# Patient Record
Sex: Female | Born: 1955
Health system: Southern US, Community
[De-identification: ages and names within clinical notes are randomized; demographics above are authoritative.]

## PROBLEM LIST (undated history)

## (undated) DIAGNOSIS — I779 Disorder of arteries and arterioles, unspecified: Secondary | ICD-10-CM

## (undated) DIAGNOSIS — F32A Depression, unspecified: Secondary | ICD-10-CM

## (undated) DIAGNOSIS — I809 Phlebitis and thrombophlebitis of unspecified site: Secondary | ICD-10-CM

## (undated) DIAGNOSIS — I2089 Other forms of angina pectoris: Secondary | ICD-10-CM

## (undated) DIAGNOSIS — I471 Supraventricular tachycardia, unspecified: Secondary | ICD-10-CM

## (undated) DIAGNOSIS — I8393 Asymptomatic varicose veins of bilateral lower extremities: Secondary | ICD-10-CM

## (undated) DIAGNOSIS — K219 Gastro-esophageal reflux disease without esophagitis: Secondary | ICD-10-CM

## (undated) DIAGNOSIS — G4733 Obstructive sleep apnea (adult) (pediatric): Secondary | ICD-10-CM

## (undated) DIAGNOSIS — I10 Essential (primary) hypertension: Secondary | ICD-10-CM

## (undated) DIAGNOSIS — M1990 Unspecified osteoarthritis, unspecified site: Secondary | ICD-10-CM

## (undated) DIAGNOSIS — I700 Atherosclerosis of aorta: Secondary | ICD-10-CM

## (undated) DIAGNOSIS — Z955 Presence of coronary angioplasty implant and graft: Secondary | ICD-10-CM

## (undated) DIAGNOSIS — E785 Hyperlipidemia, unspecified: Secondary | ICD-10-CM

## (undated) DIAGNOSIS — B019 Varicella without complication: Secondary | ICD-10-CM

## (undated) DIAGNOSIS — Z7902 Long term (current) use of antithrombotics/antiplatelets: Secondary | ICD-10-CM

## (undated) DIAGNOSIS — Z7982 Long term (current) use of aspirin: Secondary | ICD-10-CM

## (undated) DIAGNOSIS — I2510 Atherosclerotic heart disease of native coronary artery without angina pectoris: Secondary | ICD-10-CM

## (undated) DIAGNOSIS — R0600 Dyspnea, unspecified: Secondary | ICD-10-CM

## (undated) DIAGNOSIS — R011 Cardiac murmur, unspecified: Secondary | ICD-10-CM

## (undated) DIAGNOSIS — F419 Anxiety disorder, unspecified: Secondary | ICD-10-CM

## (undated) DIAGNOSIS — M810 Age-related osteoporosis without current pathological fracture: Secondary | ICD-10-CM

## (undated) DIAGNOSIS — I519 Heart disease, unspecified: Secondary | ICD-10-CM

## (undated) DIAGNOSIS — Z87442 Personal history of urinary calculi: Secondary | ICD-10-CM

## (undated) DIAGNOSIS — T7840XA Allergy, unspecified, initial encounter: Secondary | ICD-10-CM

## (undated) DIAGNOSIS — K529 Noninfective gastroenteritis and colitis, unspecified: Secondary | ICD-10-CM

## (undated) DIAGNOSIS — D649 Anemia, unspecified: Secondary | ICD-10-CM

## (undated) DIAGNOSIS — I82409 Acute embolism and thrombosis of unspecified deep veins of unspecified lower extremity: Secondary | ICD-10-CM

## (undated) DIAGNOSIS — I499 Cardiac arrhythmia, unspecified: Secondary | ICD-10-CM

## (undated) DIAGNOSIS — E7841 Elevated Lipoprotein(a): Secondary | ICD-10-CM

## (undated) DIAGNOSIS — K5090 Crohn's disease, unspecified, without complications: Secondary | ICD-10-CM

## (undated) DIAGNOSIS — Z9841 Cataract extraction status, right eye: Secondary | ICD-10-CM

## (undated) DIAGNOSIS — I219 Acute myocardial infarction, unspecified: Secondary | ICD-10-CM

## (undated) DIAGNOSIS — Z9889 Other specified postprocedural states: Secondary | ICD-10-CM

## (undated) DIAGNOSIS — N200 Calculus of kidney: Secondary | ICD-10-CM

## (undated) DIAGNOSIS — R0609 Other forms of dyspnea: Secondary | ICD-10-CM

## (undated) DIAGNOSIS — T4145XA Adverse effect of unspecified anesthetic, initial encounter: Secondary | ICD-10-CM

## (undated) DIAGNOSIS — D509 Iron deficiency anemia, unspecified: Secondary | ICD-10-CM

## (undated) DIAGNOSIS — G4700 Insomnia, unspecified: Secondary | ICD-10-CM

## (undated) DIAGNOSIS — B029 Zoster without complications: Secondary | ICD-10-CM

## (undated) DIAGNOSIS — I214 Non-ST elevation (NSTEMI) myocardial infarction: Secondary | ICD-10-CM

## (undated) DIAGNOSIS — R112 Nausea with vomiting, unspecified: Secondary | ICD-10-CM

## (undated) DIAGNOSIS — G43909 Migraine, unspecified, not intractable, without status migrainosus: Secondary | ICD-10-CM

## (undated) DIAGNOSIS — T8859XA Other complications of anesthesia, initial encounter: Secondary | ICD-10-CM

## (undated) DIAGNOSIS — M2392 Unspecified internal derangement of left knee: Secondary | ICD-10-CM

## (undated) HISTORY — PX: EYE SURGERY: SHX253

## (undated) HISTORY — DX: Phlebitis and thrombophlebitis of unspecified site: I80.9

## (undated) HISTORY — DX: Allergy, unspecified, initial encounter: T78.40XA

## (undated) HISTORY — DX: Cardiac murmur, unspecified: R01.1

## (undated) HISTORY — DX: Crohn's disease, unspecified, without complications: K50.90

## (undated) HISTORY — PX: OTHER SURGICAL HISTORY: SHX169

## (undated) HISTORY — PX: FRACTURE SURGERY: SHX138

## (undated) HISTORY — DX: Calculus of kidney: N20.0

## (undated) HISTORY — DX: Essential (primary) hypertension: I10

## (undated) HISTORY — DX: Acute myocardial infarction, unspecified: I21.9

## (undated) HISTORY — DX: Varicella without complication: B01.9

## (undated) HISTORY — DX: Hyperlipidemia, unspecified: E78.5

## (undated) HISTORY — DX: Anxiety disorder, unspecified: F41.9

## (undated) HISTORY — PX: CATARACT EXTRACTION: SUR2

## (undated) HISTORY — PX: BREAST CYST ASPIRATION: SHX578

## (undated) HISTORY — DX: Migraine, unspecified, not intractable, without status migrainosus: G43.909

## (undated) HISTORY — DX: Heart disease, unspecified: I51.9

## (undated) HISTORY — DX: Noninfective gastroenteritis and colitis, unspecified: K52.9

---
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
## 1988-11-09 HISTORY — PX: OTHER SURGICAL HISTORY: SHX169

## 1991-11-10 HISTORY — PX: TUBAL LIGATION: SHX77

## 2005-01-20 ENCOUNTER — Emergency Department: Payer: Self-pay | Admitting: Emergency Medicine

## 2005-01-28 ENCOUNTER — Ambulatory Visit: Payer: Self-pay | Admitting: Occupational Therapy

## 2005-02-10 ENCOUNTER — Ambulatory Visit: Payer: Self-pay | Admitting: Occupational Therapy

## 2005-02-23 ENCOUNTER — Ambulatory Visit: Payer: Self-pay | Admitting: Occupational Therapy

## 2005-02-24 ENCOUNTER — Ambulatory Visit: Payer: Self-pay | Admitting: Neurology

## 2005-10-07 ENCOUNTER — Ambulatory Visit: Payer: Self-pay | Admitting: General Surgery

## 2006-03-15 ENCOUNTER — Ambulatory Visit: Payer: Self-pay | Admitting: General Surgery

## 2007-03-17 ENCOUNTER — Ambulatory Visit: Payer: Self-pay | Admitting: General Surgery

## 2007-09-02 ENCOUNTER — Ambulatory Visit: Payer: Self-pay | Admitting: Internal Medicine

## 2007-12-29 ENCOUNTER — Ambulatory Visit: Payer: Self-pay | Admitting: Internal Medicine

## 2008-07-10 ENCOUNTER — Emergency Department: Payer: Self-pay | Admitting: Emergency Medicine

## 2010-05-07 ENCOUNTER — Ambulatory Visit: Payer: Self-pay | Admitting: Ophthalmology

## 2010-05-26 LAB — HM PAP SMEAR: HM PAP: NEGATIVE

## 2010-11-19 ENCOUNTER — Ambulatory Visit: Payer: Self-pay | Admitting: Internal Medicine

## 2011-02-18 LAB — HM DEXA SCAN

## 2011-05-29 DIAGNOSIS — M2550 Pain in unspecified joint: Secondary | ICD-10-CM | POA: Insufficient documentation

## 2011-11-30 ENCOUNTER — Ambulatory Visit: Payer: Self-pay

## 2011-12-21 ENCOUNTER — Ambulatory Visit: Payer: Self-pay

## 2012-04-06 ENCOUNTER — Ambulatory Visit: Payer: Self-pay | Admitting: Ophthalmology

## 2012-05-17 ENCOUNTER — Ambulatory Visit: Payer: Self-pay | Admitting: Internal Medicine

## 2012-10-12 DIAGNOSIS — K5090 Crohn's disease, unspecified, without complications: Secondary | ICD-10-CM | POA: Insufficient documentation

## 2013-04-28 LAB — HM COLONOSCOPY

## 2013-05-18 LAB — TSH: TSH: 1.87 u[IU]/mL (ref 0.41–5.90)

## 2013-06-01 ENCOUNTER — Ambulatory Visit: Payer: Self-pay | Admitting: Internal Medicine

## 2013-12-04 ENCOUNTER — Ambulatory Visit: Payer: Self-pay | Admitting: Physician Assistant

## 2013-12-04 LAB — RAPID STREP-A WITH REFLX: MICRO TEXT REPORT: NEGATIVE

## 2013-12-04 LAB — RAPID INFLUENZA A&B ANTIGENS

## 2013-12-04 LAB — MONONUCLEOSIS SCREEN: Mono Test: NEGATIVE

## 2013-12-07 LAB — BETA STREP CULTURE(ARMC)

## 2013-12-17 ENCOUNTER — Emergency Department: Payer: Self-pay | Admitting: Emergency Medicine

## 2013-12-17 LAB — CBC
HCT: 38.8 % (ref 35.0–47.0)
HGB: 13 g/dL (ref 12.0–16.0)
MCH: 31.2 pg (ref 26.0–34.0)
MCHC: 33.6 g/dL (ref 32.0–36.0)
MCV: 93 fL (ref 80–100)
Platelet: 324 10*3/uL (ref 150–440)
RBC: 4.18 10*6/uL (ref 3.80–5.20)
RDW: 13.7 % (ref 11.5–14.5)
WBC: 16.4 10*3/uL — AB (ref 3.6–11.0)

## 2013-12-17 LAB — COMPREHENSIVE METABOLIC PANEL
Albumin: 3.6 g/dL (ref 3.4–5.0)
Alkaline Phosphatase: 92 U/L
Anion Gap: 7 (ref 7–16)
BUN: 21 mg/dL — ABNORMAL HIGH (ref 7–18)
Bilirubin,Total: 0.3 mg/dL (ref 0.2–1.0)
CALCIUM: 8.6 mg/dL (ref 8.5–10.1)
CHLORIDE: 108 mmol/L — AB (ref 98–107)
CREATININE: 1.05 mg/dL (ref 0.60–1.30)
Co2: 24 mmol/L (ref 21–32)
EGFR (African American): >60
EGFR (Non-African Amer.): 58 — ABNORMAL LOW
GLUCOSE: 122 mg/dL — AB (ref 65–99)
Osmolality: 282 (ref 275–301)
Potassium: 3.1 mmol/L — ABNORMAL LOW (ref 3.5–5.1)
SGOT(AST): 29 U/L (ref 15–37)
SGPT (ALT): 37 U/L (ref 12–78)
SODIUM: 139 mmol/L (ref 136–145)
Total Protein: 7.1 g/dL (ref 6.4–8.2)

## 2013-12-17 LAB — URINALYSIS, COMPLETE
BLOOD: NEGATIVE
Bacteria: NEGATIVE
Bilirubin,UR: NEGATIVE
GLUCOSE, UR: NEGATIVE mg/dL (ref 0–75)
KETONE: NEGATIVE
Nitrite: NEGATIVE
PROTEIN: NEGATIVE
Ph: 7 (ref 4.5–8.0)
RBC,UR: NONE SEEN /HPF (ref 0–5)
SPECIFIC GRAVITY: 1.048 (ref 1.003–1.030)

## 2013-12-17 LAB — TROPONIN I: Troponin-I: <0.02 ng/mL

## 2014-03-12 DIAGNOSIS — D1803 Hemangioma of intra-abdominal structures: Secondary | ICD-10-CM | POA: Insufficient documentation

## 2014-04-25 DIAGNOSIS — K869 Disease of pancreas, unspecified: Secondary | ICD-10-CM | POA: Insufficient documentation

## 2014-08-07 ENCOUNTER — Ambulatory Visit: Payer: Self-pay | Admitting: Internal Medicine

## 2014-08-28 ENCOUNTER — Ambulatory Visit: Payer: Self-pay | Admitting: Physician Assistant

## 2014-09-13 ENCOUNTER — Emergency Department: Payer: Self-pay | Admitting: Emergency Medicine

## 2014-09-21 ENCOUNTER — Emergency Department: Payer: Self-pay | Admitting: Emergency Medicine

## 2014-09-21 LAB — CBC
HCT: 37.6 % (ref 35.0–47.0)
HGB: 12.4 g/dL (ref 12.0–16.0)
MCH: 30.5 pg (ref 26.0–34.0)
MCHC: 32.9 g/dL (ref 32.0–36.0)
MCV: 93 fL (ref 80–100)
PLATELETS: 363 10*3/uL (ref 150–440)
RBC: 4.06 10*6/uL (ref 3.80–5.20)
RDW: 14 % (ref 11.5–14.5)
WBC: 20.4 10*3/uL — ABNORMAL HIGH (ref 3.6–11.0)

## 2014-09-21 LAB — COMPREHENSIVE METABOLIC PANEL
ALK PHOS: 92 U/L
ALT: 42 U/L
ANION GAP: 12 (ref 7–16)
Albumin: 3.6 g/dL (ref 3.4–5.0)
BUN: 14 mg/dL (ref 7–18)
Bilirubin,Total: 0.3 mg/dL (ref 0.2–1.0)
Calcium, Total: 8.7 mg/dL (ref 8.5–10.1)
Chloride: 105 mmol/L (ref 98–107)
Co2: 24 mmol/L (ref 21–32)
Creatinine: 1.08 mg/dL (ref 0.60–1.30)
EGFR (African American): >60
GFR CALC NON AF AMER: 55 — AB
Glucose: 158 mg/dL — ABNORMAL HIGH (ref 65–99)
Osmolality: 285 (ref 275–301)
Potassium: 2.9 mmol/L — ABNORMAL LOW (ref 3.5–5.1)
SGOT(AST): 30 U/L (ref 15–37)
Sodium: 141 mmol/L (ref 136–145)
TOTAL PROTEIN: 7.5 g/dL (ref 6.4–8.2)

## 2014-09-21 LAB — TROPONIN I: TROPONIN-I: 0.02 ng/mL

## 2014-09-22 LAB — TROPONIN I
Troponin-I: 0.41 ng/mL — ABNORMAL HIGH
Troponin-I: 0.61 ng/mL — ABNORMAL HIGH

## 2014-09-22 LAB — CK-MB
CK-MB: 2.6 ng/mL (ref 0.5–3.6)
CK-MB: 3.9 ng/mL — ABNORMAL HIGH (ref 0.5–3.6)
CK-MB: 4.6 ng/mL — ABNORMAL HIGH (ref 0.5–3.6)
CK-MB: 4.7 ng/mL — ABNORMAL HIGH (ref 0.5–3.6)

## 2014-09-22 LAB — APTT: Activated PTT: 28.4 secs (ref 23.6–35.9)

## 2014-09-22 LAB — PROTIME-INR
INR: 1.1
Prothrombin Time: 13.8 secs (ref 11.5–14.7)

## 2014-09-22 LAB — HEPARIN LEVEL (UNFRACTIONATED): Anti-Xa(Unfractionated): 0.61 IU/mL (ref 0.30–0.70)

## 2014-09-23 ENCOUNTER — Ambulatory Visit: Payer: Self-pay | Admitting: Orthopedic Surgery

## 2014-09-23 ENCOUNTER — Inpatient Hospital Stay: Payer: Self-pay | Admitting: Internal Medicine

## 2014-09-23 LAB — HEPARIN LEVEL (UNFRACTIONATED): Anti-Xa(Unfractionated): 0.34 IU/mL (ref 0.30–0.70)

## 2014-09-23 LAB — CBC WITH DIFFERENTIAL/PLATELET
Basophil #: 0 10*3/uL (ref 0.0–0.1)
Basophil %: 0.4 %
Eosinophil #: 0.1 10*3/uL (ref 0.0–0.7)
Eosinophil %: 1.1 %
HCT: 34.4 % — ABNORMAL LOW (ref 35.0–47.0)
HGB: 11.4 g/dL — ABNORMAL LOW (ref 12.0–16.0)
Lymphocyte #: 2.7 10*3/uL (ref 1.0–3.6)
Lymphocyte %: 22.2 %
MCH: 31 pg (ref 26.0–34.0)
MCHC: 33.2 g/dL (ref 32.0–36.0)
MCV: 94 fL (ref 80–100)
Monocyte #: 0.8 x10 3/mm (ref 0.2–0.9)
Monocyte %: 6.4 %
Neutrophil #: 8.5 10*3/uL — ABNORMAL HIGH (ref 1.4–6.5)
Neutrophil %: 69.9 %
Platelet: 266 10*3/uL (ref 150–440)
RBC: 3.68 10*6/uL — ABNORMAL LOW (ref 3.80–5.20)
RDW: 14.2 % (ref 11.5–14.5)
WBC: 12.2 10*3/uL — ABNORMAL HIGH (ref 3.6–11.0)

## 2014-09-23 LAB — BASIC METABOLIC PANEL
Anion Gap: 6 — ABNORMAL LOW (ref 7–16)
BUN: 14 mg/dL (ref 7–18)
Calcium, Total: 7.8 mg/dL — ABNORMAL LOW (ref 8.5–10.1)
Chloride: 112 mmol/L — ABNORMAL HIGH (ref 98–107)
Co2: 25 mmol/L (ref 21–32)
Creatinine: 0.84 mg/dL (ref 0.60–1.30)
EGFR (African American): >60
EGFR (Non-African Amer.): >60
Glucose: 87 mg/dL (ref 65–99)
Osmolality: 285 (ref 275–301)
Potassium: 4.1 mmol/L (ref 3.5–5.1)
Sodium: 143 mmol/L (ref 136–145)

## 2014-09-24 HISTORY — PX: LEFT HEART CATH AND CORONARY ANGIOGRAPHY: CATH118249

## 2014-09-24 LAB — CBC WITH DIFFERENTIAL/PLATELET
Basophil #: 0 10*3/uL (ref 0.0–0.1)
Basophil %: 0.4 %
Eosinophil #: 0.3 10*3/uL (ref 0.0–0.7)
Eosinophil %: 2.8 %
HCT: 37.5 % (ref 35.0–47.0)
HGB: 12.6 g/dL (ref 12.0–16.0)
Lymphocyte #: 2.4 10*3/uL (ref 1.0–3.6)
Lymphocyte %: 22.4 %
MCH: 31 pg (ref 26.0–34.0)
MCHC: 33.5 g/dL (ref 32.0–36.0)
MCV: 93 fL (ref 80–100)
Monocyte #: 1 x10 3/mm — ABNORMAL HIGH (ref 0.2–0.9)
Monocyte %: 9.1 %
Neutrophil #: 7 10*3/uL — ABNORMAL HIGH (ref 1.4–6.5)
Neutrophil %: 65.3 %
Platelet: 301 10*3/uL (ref 150–440)
RBC: 4.06 10*6/uL (ref 3.80–5.20)
RDW: 13.7 % (ref 11.5–14.5)
WBC: 10.8 10*3/uL (ref 3.6–11.0)

## 2014-09-24 LAB — LIPID PANEL
CHOLESTEROL: 191 mg/dL (ref 0–200)
HDL Cholesterol: 50 mg/dL (ref 40–60)
Ldl Cholesterol, Calc: 107 mg/dL — ABNORMAL HIGH (ref 0–100)
TRIGLYCERIDES: 172 mg/dL (ref 0–200)
VLDL Cholesterol, Calc: 34 mg/dL (ref 5–40)

## 2014-09-24 LAB — HEPARIN LEVEL (UNFRACTIONATED): Anti-Xa(Unfractionated): 0.34 IU/mL (ref 0.30–0.70)

## 2014-10-30 DIAGNOSIS — S61011A Laceration without foreign body of right thumb without damage to nail, initial encounter: Secondary | ICD-10-CM | POA: Insufficient documentation

## 2014-12-21 ENCOUNTER — Ambulatory Visit: Payer: Self-pay | Admitting: Physician Assistant

## 2014-12-21 ENCOUNTER — Ambulatory Visit: Payer: Self-pay

## 2015-02-01 LAB — BASIC METABOLIC PANEL
BUN: 15 mg/dL (ref 4–21)
Creatinine: 0.8 mg/dL (ref 0.5–1.1)
GLUCOSE: 87 mg/dL
POTASSIUM: 4.1 mmol/L (ref 3.4–5.3)
SODIUM: 140 mmol/L (ref 137–147)

## 2015-02-01 LAB — HEPATIC FUNCTION PANEL
ALT: 18 U/L (ref 7–35)
AST: 18 U/L (ref 13–35)
Alkaline Phosphatase: 79 U/L (ref 25–125)
Bilirubin, Total: 0.5 mg/dL

## 2015-02-01 LAB — LIPID PANEL
Cholesterol: 249 mg/dL — AB (ref 0–200)
HDL: 49 mg/dL (ref 35–70)
LDL CALC: 168 mg/dL
TRIGLYCERIDES: 198 mg/dL — AB (ref 40–160)

## 2015-02-18 ENCOUNTER — Emergency Department
Admit: 2015-02-18 | Disposition: A | Discharge status: Home / Self Care | Payer: Self-pay | Admitting: Emergency Medicine

## 2015-02-19 LAB — CBC
HCT: 37.7 % (ref 35.0–47.0)
HGB: 12.2 g/dL (ref 12.0–16.0)
MCH: 29.9 pg (ref 26.0–34.0)
MCHC: 32.4 g/dL (ref 32.0–36.0)
MCV: 92 fL (ref 80–100)
Platelet: 288 10*3/uL (ref 150–440)
RBC: 4.1 10*6/uL (ref 3.80–5.20)
RDW: 14.1 % (ref 11.5–14.5)
WBC: 12.9 10*3/uL — ABNORMAL HIGH (ref 3.6–11.0)

## 2015-02-19 LAB — BASIC METABOLIC PANEL
Anion Gap: 7 (ref 7–16)
BUN: 25 mg/dL — AB
CREATININE: 0.83 mg/dL
Calcium, Total: 9.1 mg/dL
Chloride: 106 mmol/L
Co2: 25 mmol/L
GLUCOSE: 118 mg/dL — AB
Potassium: 3.8 mmol/L
Sodium: 138 mmol/L

## 2015-02-19 LAB — TROPONIN I: Troponin-I: <0.03 ng/mL

## 2015-02-25 ENCOUNTER — Ambulatory Visit
Admit: 2015-02-25 | Disposition: A | Discharge status: Home / Self Care | Payer: Self-pay | Attending: Family Medicine | Admitting: Family Medicine

## 2015-02-25 LAB — COMPREHENSIVE METABOLIC PANEL
ALT: 20 U/L
AST: 25 U/L
Albumin: 3.9 g/dL
Alkaline Phosphatase: 64 U/L
Anion Gap: 9 (ref 7–16)
BUN: 12 mg/dL
Bilirubin,Total: 0.5 mg/dL
Calcium, Total: 8.9 mg/dL
Chloride: 97 mmol/L — ABNORMAL LOW
Co2: 29 mmol/L
Creatinine: 0.83 mg/dL
EGFR (African American): >60
Glucose: 100 mg/dL — ABNORMAL HIGH
Potassium: 3.8 mmol/L
Sodium: 135 mmol/L
TOTAL PROTEIN: 7.2 g/dL

## 2015-02-25 LAB — URINALYSIS, COMPLETE
Bilirubin,UR: NEGATIVE
Blood: NEGATIVE
Glucose,UR: NEGATIVE
Ketone: NEGATIVE
Nitrite: NEGATIVE
PH: 5.5 (ref 5.0–8.0)
RBC,UR: NONE SEEN /HPF (ref 0–5)
SPECIFIC GRAVITY: 1.02 (ref 1.000–1.030)

## 2015-02-25 LAB — CBC WITH DIFFERENTIAL/PLATELET
Basophil #: 0 10*3/uL (ref 0.0–0.1)
Basophil %: 0.5 %
EOS ABS: 0.1 10*3/uL (ref 0.0–0.7)
Eosinophil %: 0.6 %
HCT: 40.4 % (ref 35.0–47.0)
HGB: 13.8 g/dL (ref 12.0–16.0)
LYMPHS ABS: 1.3 10*3/uL (ref 1.0–3.6)
Lymphocyte %: 13.3 %
MCH: 30.7 pg (ref 26.0–34.0)
MCHC: 34.2 g/dL (ref 32.0–36.0)
MCV: 90 fL (ref 80–100)
MONO ABS: 1.5 x10 3/mm — AB (ref 0.2–0.9)
Monocyte %: 14.8 %
Neutrophil #: 6.9 10*3/uL — ABNORMAL HIGH (ref 1.4–6.5)
Neutrophil %: 70.8 %
Platelet: 192 10*3/uL (ref 150–440)
RBC: 4.51 10*6/uL (ref 3.80–5.20)
RDW: 14 % (ref 11.5–14.5)
WBC: 9.8 10*3/uL (ref 3.6–11.0)

## 2015-02-25 LAB — RAPID INFLUENZA A&B ANTIGENS

## 2015-03-02 NOTE — Consult Note (Signed)
PATIENT NAME:  Morgan, Moore MR#:  [id number] DATE OF BIRTH:  10-02-1956  DATE OF CONSULTATION:  09/23/2014  CONSULTING PHYSICIAN:  Claud Kelp, MD  CHIEF COMPLAINT: Right thumb near traumatic degloving injury.  HISTORY OF PRESENT ILLNESS: Morgan Moore is a 59 year old female who is previously known to [employer] as she sustained a near distal right thumb amputation from a traumatic laceration 10 days ago. The wound was  irrigated and closed and she is set to follow up with a hand surgeon in [place].  Her recovery was complicated by an anaphylactic reaction to her Keflex for which she was started on and secondarily when she was switched to Bactrim she had a similar anaphylactic reaction. She has been undergoing daily dressing changes to her thumb and is admitted presently for chest pain and is scheduled to undergo a cardiac catheterization tomorrow. Nursing was doing her daily dressing change and consulted with orthopedics just to take a look at her progress. The patient was complaining of some new burning sensation in the pad of her thumb.   REVIEW OF SYSTEMS:  CONSTITUTIONAL: She denies any fevers or chills. HEENT: She denies any blurred vision or double vision. She is denying any hearing loss or tinnitus.   RESPIRATORY:  She is denying any chest pain or shortness of breath.  GASTROINTESTINAL: She is denying any nausea or vomiting.   PAST MEDICAL HISTORY: Significant for Crohn disease, osteopenia, as well as a history of a DVT.   PAST SURGICAL HISTORY:  She did have open reduction internal fixation of her forearm as a child.  She has had bilateral tubal ligation as well as tonsillectomy with adenoidectomy.    HOME MEDICATIONS: Include aspirin, oxycodone, verapamil, and Zofran.  ALLERGIES: CURRENTLY INCLUDE KEFLEX, CODEINE, MECLIZINE, AND BACTRIM.   PHYSICAL EXAMINATION:  GENERAL:  She is a very pleasant 59 year old female in no acute distress.  HAND:  Her right thumb  demonstrates a complex laceration extending across the entire volar surface just distal to the IP joint.  The suture line is dry and intact with no drainage. There is a very dusky appearance to the distal aspect of the skin flap which she states has been stable for the past several days. There is no swelling or tenderness proximal to the IP joint. She is able to range her [facility] joint without pain.   ASSESSMENT: A 60 year old female with complex thumb laceration. She has an appointment with a hand surgeon in [place] next week. Again, she is here for her cardiac catheterization. I recommend she continue her daily dressing changes and ice and elevate as appropriate.  Her burning sensation is likely secondary to digital nerve damage at the time of her traumatic injury.  There are no signs of infection presently. We will continue to monitor while she is an inpatient and then upon discharge she will follow up with her scheduled hand surgeon.    ____________________________ Claud Kelp, MD tte:LT D: 09/23/2014 16:41:13 ET T: 09/23/2014 17:38:44 ET JOB#: [id number]  cc: Claud Kelp, MD, <Dictator> Claud Kelp MD ELECTRONICALLY SIGNED 09/23/2014 18:59

## 2015-03-02 NOTE — Consult Note (Signed)
PATIENT NAME:  Morgan Moore, WEINGARTEN MR#:  [id number] DATE OF BIRTH:  1956-01-09  DATE OF CONSULTATION:  09/22/2014  REFERRING PHYSICIAN:  Rosilyn Mings, MD.  CONSULTING PHYSICIAN:  Dwayne D. Mohnton, MD  PRIMARY CARE PHYSICIAN:   INDICATION: Chest pain, elevated troponin.   HISTORY OF PRESENT ILLNESS:  The patient is a 59 year old white female who came to the Emergency Room on multiple occasions with possible allergic reaction. She had been given intravenous steroids previous to admission, complained of her throat swelling, tongue swelling, mild shortness of breath, dryness in the throat, difficulty swallowing. The felt like she had palpitations, tachycardia, which she has had in the past apparently.  She complained of some chest tightness.  The night of admission she presented with a problem with her throat again, not as bad as the day before, with some chest pain. EKG suggested ST segment depression laterally consistent with ischemia.  She had elevated troponin. She was admitted for further evaluation and care.  Her breathing and her throat have improved. She has had palpitations, tachycardia as well as some chest discomfort.   REVIEW OF SYSTEMS:  No blackout spells or syncope. No nausea or vomiting. Denies fever, chills, sweats. No weight loss, no weight gain, hemoptysis, emesis, denies bright red blood per rectum, denies vision change, hearing change. No sputum production, no cough. She has had shortness of breath, chest tightness and palpitations, tachycardia, throat swelling, tongue swelling, possible allergic reaction.  PAST MEDICAL HISTORY: Crohn disease, osteopenia, migraine headaches, history of DVT, palpitations, tachycardia, recent thumb injury, borderline obesity. DJD, mild depression.   PAST SURGICAL HISTORY:  Right arm surgery as a child, BTL, tonsillectomy, right thumb repair from trauma.   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 No smoking or alcohol consumption.   FAMILY  HISTORY: Cardiac disease, CVA. Her daughter died in the motor vehicle accident at age 30.   MEDICATIONS: Aspirin 81 mg a day, Bactrim double strength twice a day. EpiPen as needed, oxycodone p.r.n. for pain, verapamil 120 mg once a day, Zofran p.r.n.   ALLERGIES: SHE THINKS THEY ARE: KEFLEX, CODEINE, MECLIZINE, POSSIBLY BACTRIM.   LABORATORIES: Glucose 158, BUN 14, creatinine 1.08, sodium 141, potassium 2.9, chloride 105, bicarbonate 24. Calcium is 8.7. LFTs unremarkable. Initial troponin was negative.  Followup troponin was elevated. White count 20, hemoglobin 12, hematocrit 37.6.   Chest x-ray negative.  PHYSICAL EXAMINATION:  VITAL SIGNS: Blood pressure initially 150/90, pulse 90, respiratory rate 16, afebrile.  HEENT: Normocephalic, atraumatic. Pupils equal and reactive to light.  NECK: Supple. No significant JVD, bruits or adenopathy.  LUNGS: clear no significant wheeze, rhonchi, or rale.  HEART: Regular rhythm. Positive heart sounds. No rebound, guarding or tenderness.  EXTREMITIES: Within normal limits, except for bandage on her right thumb.   intact. SKIN:  Normal.   EKG: Initially, diffuse 1-2 mm ST depression laterally, , significant elevation, sinus rhythm, rate of about 90. Follow-up EKG shows resolution of ST segment changes.   ASSESSMENT: Chest pain.  Abnormal EKG, possible   angina, elevated troponin, palpitations, tachycardia, possible allergic reaction, thumb injury. Hypokalemia, Crohn disease, elevated white count, possibly related to steroid use.   PLAN:  1.  Agree with admit. Place on telemetry. Rule out for myocardial infarction. Follow up EKGs and troponins. I am concerned the elevated troponin may be related to demand ischemia versus non-Q subendocardial MI. Recommend short-term anticoagulation, aspirin, consider beta blockers therapy. Consider functional study versus cardiac catheterization for further evaluation. 2.  Hypokalemia. Will correct electrolyte, this  can  contribute to abnormal EKG findings. It is not low enough to account for her diffuse lateral ST segment changes and this may be related to some form of ischemia.  3.  Palpitations, tachycardia.  Add beta-blocker instead of verapamil or increase the verapamil dose to see if that does a better job of controlling the palpitations, tachycardia.  3.  Again, the leukocytosis with a white count of 20 may be related to steroid use from her allergic reaction presentation to the Emergency Room.  3.  Continue right thumb therapy. Will require antibiotic therapy and follow up with the hand surgeon to help with healing.  4.  Crohn disease. The patient is on mesalamine. We will probably continue the dose of that. She appears to be reasonably stable at this point. I recommend DVT prophylaxis right now with heparin and consider Lovenox at a later date when she is off full-dose heparin.   5.  GI prophylaxis. If necessary, she has been c/o symptoms. Continue to follow for possible allergic reaction.   We will treat the patient medically and conservatively for now. Consider further cardiac work-up with her significant family history, abnormal EKG changes and elevated troponin, as well as other risk factors.    ____________________________ Loran Senters Clayborn Bigness, MD ddc:lt D: 09/22/2014 13:03:30 ET T: 09/22/2014 14:30:06 ET JOB#: [id number]  cc: Dwayne D. Clayborn Bigness, MD, <Dictator> Yolonda Kida MD ELECTRONICALLY SIGNED 10/12/2014 11:35

## 2015-03-02 NOTE — Consult Note (Signed)
Chief Complaint:  Subjective/Chief Complaint Patient states be doing reasonably well still concerned about palpitations tachycardia chest pain is better her thumb still hurts on the right side patient still concerned about her cardiac status   VITAL SIGNS/ANCILLARY NOTES: **Vital Signs.:   15-Nov-15 07:41  Vital Signs Type Routine  Celsius 36.4  Temperature Source oral  Pulse Pulse 78  Respirations Respirations 19  Systolic BP Systolic BP 417  Diastolic BP (mmHg) Diastolic BP (mmHg) 80  Mean BP 111  Pulse Ox % Pulse Ox % 95  Pulse Ox Activity Level  At rest  Oxygen Delivery Room Air/ 21 %  *Intake and Output.:   Daily 15-Nov-15 07:00  Grand Totals Intake:  1329.5 Output:  1000    Net:  329.5 24 Hr.:  329.5  IV (Primary)      In:  1329.5  Urine ml     Out:  1000  Length of Stay Totals Intake:  1329.5 Output:  1650    Net:  -320.5   Brief Assessment:  GEN well developed, well nourished, no acute distress   Cardiac Regular  murmur present   Respiratory normal resp effort   Gastrointestinal Normal   Gastrointestinal details normal Soft  Nontender  Nondistended   EXTR negative cyanosis/clubbing, negative edema, right thumb injury bandage   Lab Results: LabObservation:  14-Nov-15 10:58   OBSERVATION Reason for Test  Hepatic:  13-Nov-15 22:18   Bilirubin, Total 0.3  Alkaline Phosphatase 92 (46-116 NOTE: New Reference Range 05/29/14)  SGPT (ALT) 42 (14-63 NOTE: New Reference Range 05/29/14)  SGOT (AST) 30  Total Protein, Serum 7.5  Albumin, Serum 3.6  Cardiology:  14-Nov-15 10:58   Echo Doppler REASON FOR EXAM:     COMMENTS:     PROCEDURE: [place] - ECHO DOPPLER COMPLETE(TRANSTHOR)  - Sep 22 2014 10:58AM   RESULT: Echocardiogram Report  Patient Name:   Morgan Moore Date of Exam: 09/22/2014 Medical Rec #:  [id number]                Custom1: Date of Birth:  03-24-1956              Height:       62.0 in Patient Age:    59 years              Weight:        129.0 lb Patient Gender: F                     BSA:          1.59 m??  Indications: MI Sonographer:    Janalee Dane RCS Referring Phys: Rosilyn Mings, S  Summary:  1. Left ventricular ejection fraction, by visual estimation, is >75%.  2. Hyperdynamic global left ventricular systolic function.  3. Mildly dilated left atrium.  4. Mildly dilated right atrium.  5.Mild to moderate mitral valve regurgitation.  6. Mild tricuspid regurgitation. 2D AND M-MODE MEASUREMENTS (normal ranges within parentheses): Left Ventricle:          Normal IVSd (2D):      0.76 cm (0.7-1.1) LVPWd (2D):     0.90 cm (0.7-1.1) Aorta/LA:                  Normal LVIDd (2D):     4.15 cm (3.4-5.7) Aortic Root (2D): 3.10 cm (2.4-3.7) LVIDs (2D):     2.14 cm           Left Atrium (2D): 4.00 cm (  1.9-4.0) LV FS (2D):     48.4 %   (>25%) LV EF (2D):     80.2 %   (>50%)                       Right Ventricle:                                   RVd (2D):        4.27 cm LV DIASTOLIC FUNCTION: MV Peak E: 1.36 m/s E/e' Ratio: 14.50 MV Peak A: 0.86 m/s Decel Time: 116 msec E/A Ratio: 1.59 SPECTRAL DOPPLER ANALYSIS (where applicable): Mitral Valve: MV P1/2 Time: 33.64 msec MV Area, PHT: 6.54 cm?? Tricuspid Valve and PA/RV Systolic Pressure: TR Max Velocity: 2.39 m/s RA  Pressure: 5 mmHg RVSP/PASP: 27.8 mmHg PHYSICIAN INTERPRETATION: Left Ventricle: The left ventricular internal cavity size was normal. LV  septal wall thickness was normal. LV posterior wall thickness was normal.  Global LV systolic function was hyperdynamic. Left ventricular ejection  fraction, by visual estimation, is >75%. Right Ventricle: The right ventricular size is normal. Global RV systolic  function is normal. Left Atrium: The left atrium is mildly dilated. Right Atrium: The right atrium is mildly dilated. Pericardium: There is no evidence of pericardial effusion. Mitral Valve: The mitral valve is normal in structure. Mild to moderate   mitral valve regurgitation is seen. Tricuspid Valve: The tricuspid valve is normal. Mild tricuspid  regurgitation is visualized. The tricuspid regurgitant velocity is 2.39  m/s,and with an assumed right atrial pressure of 5 mmHg, the estimated   right ventricular systolic pressure is normal at 27.8 mmHg. Aortic Valve: The aortic valve is normal. No evidence of aortic valve  regurgitation is seen. Pulmonic Valve: The pulmonic valve is normal. No indication of pulmonic  valve regurgitation.  M[place] MD Electronically signed by Aurora Lujean Amel MD Signature Date/Time: 09/23/2014/8:31:08 AM  *** Final ***  IMPRESSION: .   Verified By: Yolonda Kida, M.D., MD  Routine Chem:  13-Nov-15 22:18   Glucose, Serum  158  BUN 14  Creatinine (comp) 1.08  Sodium, Serum 141  Potassium, Serum  2.9  Chloride, Serum 105  CO2, Serum 24  Calcium (Total), Serum 8.7  Anion Gap 12  Osmolality (calc) 285  eGFR (African American) >60  eGFR (Non-African American)  55 (eGFR values <77m/min/1.73 m2 may be an indication of chronic kidney disease (CKD). Calculated eGFR, using the MRDR Study equation, is useful in  patients with stable renal function. The eGFR calculation will not be reliable in acutely ill patients when serum creatinine is changing rapidly. It is not useful in patients on dialysis. The eGFR calculation may not be applicable to patients at the low and high extremes of body sizes, pregnant women, and vegetarians.)  14-Nov-15 03:00   Result Comment TROPONIN - RESULTS VERIFIED BY REPEAT TESTING.  - C/BRITTANY CLINGAN AT 0408 09/22/14.[facility]  - READ-BACK PROCESS PERFORMED.  Result(s) reported on 22 Sep 2014 at 04:12AM.    05:54   Result Comment TROPONIN - RESULTS VERIFIED BY REPEAT TESTING.  - PREVIOUSLY CALLED ON 09/22/14 AT 0408  - BY [facility] - [facility]  Result(s) reported on 22 Sep 2014 at 06:44AM.  15-Nov-15 04:25   Glucose, Serum 87  BUN 14  Creatinine (comp)  0.84  Sodium, Serum 143  Potassium, Serum 4.1  Chloride, Serum  112  CO2, Serum 25  Calcium (Total), Serum  7.8  Anion Gap  6  Osmolality (calc) 285  eGFR (African American) >60  eGFR (Non-African American) >60 (eGFR values <62m/min/1.73 m2 may be an indication of chronic kidney disease (CKD). Calculated eGFR, using the MRDR Study equation, is useful in  patients with stable renal function. The eGFR calculation will not be reliable in acutely ill patients when serum creatinine is changing rapidly. It is not useful in patients on dialysis. The eGFR calculation may not be applicable to patients at the low and high extremes of body sizes, pregnant women, and vegetarians.)  Cardiac:  13-Nov-15 22:18   CPK-MB, Serum 2.6 (Result(s) reported on 22 Sep 2014 at 01:47AM.)  Troponin I 0.02 (0.00-0.05 0.05 ng/mL or less: NEGATIVE  Repeat testing in 3-6 hrs  if clinically indicated. >0.05 ng/mL: POTENTIAL  MYOCARDIAL INJURY. Repeat  testing in 3-6 hrs if  clinically indicated. NOTE: An increase or decrease  of 30% or more on serial  testing suggests a  clinically important change)  14-Nov-15 03:00   CPK-MB, Serum  3.9 (Result(s) reported on 22 Sep 2014 at 04:05AM.)  Troponin I  0.41 (0.00-0.05 0.05 ng/mL or less: NEGATIVE  Repeat testing in 3-6 hrs  if clinically indicated. >0.05 ng/mL: POTENTIAL  MYOCARDIAL INJURY. Repeat  testing in 3-6 hrs if  clinically indicated. NOTE: An increase or decrease  of 30% or more on serial  testing suggests a  clinically important change)    05:54   CPK-MB, Serum  4.6 (Result(s) reported on 22 Sep 2014 at 06:39AM.)  Troponin I  0.61 (0.00-0.05 0.05 ng/mL or less: NEGATIVE  Repeat testing in 3-6 hrs  if clinically indicated. >0.05 ng/mL: POTENTIAL  MYOCARDIAL INJURY. Repeat  testing in 3-6 hrs if  clinically indicated. NOTE: An increase or decrease  of 30% or more on serial  testing suggests a  clinically important change)    10:48    CPK-MB, Serum  4.7 (Result(s) reported on 22 Sep 2014 at 11:52AM.)  Routine Coag:  13-Nov-15 22:18   Activated PTT (APTT) 28.4 (A HCT value >55% may artifactually increase the APTT. In one study, the increase was an average of 19%. Reference: "Effect on Routine and Special Coagulation Testing Values of Citrate Anticoagulant Adjustment in Patients with High HCT Values." American Journal of Clinical Pathology 2006;126:400-405.)  Prothrombin 13.8  INR 1.1 (INR reference interval applies to patients on anticoagulant therapy. A single INR therapeutic range for coumarins is not optimal for all indications; however, the suggested range for most indications is 2.0 - 3.0. Exceptions to the INR Reference Range may include: Prosthetic heart valves, acute myocardial infarction, prevention of myocardial infarction, and combinations of aspirin and anticoagulant. The need for a higher or lower target INR must be assessed individually. Reference: The Pharmacology and Management of the Vitamin K  antagonists: the seventh ACCP Conference on Antithrombotic and Thrombolytic Therapy. CWPYKD.9833Sept:126 (3suppl): [id number] A HCT value >55% may artifactually increase the PT.  In one study,  the increase was an average of 25%. Reference:  "Effect on Routine and Special Coagulation Testing Values of Citrate Anticoagulant Adjustment in Patients with High HCT Values." American Journal of Clinical Pathology 2006;126:400-405.)  Routine Hem:  13-Nov-15 22:18   WBC (CBC)  20.4  RBC (CBC) 4.06  Hemoglobin (CBC) 12.4  Hematocrit (CBC) 37.6  Platelet Count (CBC) 363 (Result(s) reported on 21 Sep 2014 at 10:36PM.)  MCV 93  MCH 30.5  MCHC 32.9  RDW 14.0  15-Nov-15 04:25   WBC (  CBC)  12.2  RBC (CBC)  3.68  Hemoglobin (CBC)  11.4  Hematocrit (CBC)  34.4  Platelet Count (CBC) 266  MCV 94  MCH 31.0  MCHC 33.2  RDW 14.2  Neutrophil % 69.9  Lymphocyte % 22.2  Monocyte % 6.4  Eosinophil % 1.1  Basophil %  0.4  Neutrophil #  8.5  Lymphocyte # 2.7  Monocyte # 0.8  Eosinophil # 0.1  Basophil # 0.0 (Result(s) reported on 23 Sep 2014 at 05:09AM.)   Radiology Results: XRay:    13-Nov-15 22:40, Chest Portable Single View  Chest Portable Single View   REASON FOR EXAM:    Chest Pain  COMMENTS:       PROCEDURE: DXR - DXR PORTABLE CHEST SINGLE VIEW  - Sep 21 2014 10:40PM     CLINICAL DATA:  Allergic reaction, history of Crohn's disease.    EXAM:  PORTABLE CHEST - 1 VIEW    COMPARISON:  Chest radiograph December 17, 2013    FINDINGS:  The heart size and mediastinal contours are within normal limits.  Both lungs are clear. The visualized skeletal structures are  unremarkable.     IMPRESSION:  No active disease.      Electronically Signed    By: Elon Alas    On: 09/21/2014 22:56         Verified By: Ricky Ala, M.D.,  Cardiology:    14-Nov-15 10:58, Echo Doppler  Echo Doppler   REASON FOR EXAM:      COMMENTS:       PROCEDURE: [facility] - ECHO DOPPLER COMPLETE(TRANSTHOR)  - Sep 22 2014 10:58AM     RESULT: Echocardiogram Report    Patient Name:   Morgan Moore Date of Exam: 09/22/2014  Medical Rec #:  [id number]                Custom1:  Date of Birth:  Nov 03, 1956              Height:       62.0 in  Patient Age:    59 years              Weight:       129.0 lb  Patient Gender: F                     BSA:          1.59 m??    Indications: MI  Sonographer:    Janalee Dane RCS  Referring Phys: Rosilyn Mings, S    Summary:   1. Left ventricular ejection fraction, by visual estimation, is >75%.   2. Hyperdynamic global left ventricular systolic function.   3. Mildly dilated left atrium.   4. Mildly dilated right atrium.   5.Mild to moderate mitral valve regurgitation.   6. Mild tricuspid regurgitation.  2D AND M-MODE MEASUREMENTS (normal ranges within parentheses):  Left Ventricle:          Normal  IVSd (2D):      0.76 cm (0.7-1.1)  LVPWd (2D):     0.90  cm (0.7-1.1) Aorta/LA:                  Normal  LVIDd (2D):     4.15 cm (3.4-5.7) Aortic Root (2D): 3.10 cm (2.4-3.7)  LVIDs (2D):     2.14 cm           Left Atrium (2D): 4.00 cm (1.9-4.0)  LV FS (2D):  48.4 %   (>25%)  LV EF (2D):     80.2 %   (>50%)                        Right Ventricle:                                    RVd (2D):        1.66 cm  LV DIASTOLIC FUNCTION:  MV Peak E: 1.36 m/s E/e' Ratio: 14.50  MV Peak A: 0.86 m/s Decel Time: 116 msec  E/A Ratio: 1.59  SPECTRAL DOPPLER ANALYSIS (where applicable):  Mitral Valve:  MV P1/2 Time: 33.64 msec  MV Area, PHT: 6.54 cm??  Tricuspid Valve and PA/RV Systolic Pressure: TR Max Velocity: 2.39 m/s RA   Pressure: 5 mmHg RVSP/PASP: 27.8 mmHg  PHYSICIAN INTERPRETATION:  Left Ventricle: The left ventricular internal cavity size was normal. LV   septal wall thickness was normal. LV posterior wall thickness was normal.   Global LV systolic function was hyperdynamic. Left ventricular ejection   fraction, by visual estimation, is >75%.  Right Ventricle: The right ventricular size is normal. Global RV systolic   function is normal.  Left Atrium: The left atrium is mildly dilated.  Right Atrium: The right atrium is mildly dilated.  Pericardium: There is no evidence of pericardial effusion.  Mitral Valve: The mitral valve is normal in structure. Mild to moderate   mitral valve regurgitation is seen.  Tricuspid Valve: The tricuspid valve is normal. Mild tricuspid   regurgitation is visualized. The tricuspid regurgitant velocity is 2.39   m/s,and with an assumed right atrial pressure of 5 mmHg, the estimated     right ventricular systolic pressure is normal at 27.8 mmHg.  Aortic Valve: The aortic valve is normal. No evidence of aortic valve   regurgitation is seen.  Pulmonic Valve: The pulmonic valve is normal. No indication of pulmonic   valve regurgitation.    Kennedy MD  Electronically signed by [place] Lujean Amel  MD  Signature Date/Time: 09/23/2014/8:31:08 AM    *** Final ***    IMPRESSION: .      Verified By: Yolonda Kida, M.D., MD   Assessment/Plan:  Assessment/Plan:  Assessment IMP  unstable angina Abnormal EKG Elevated troponins Palpitations Tachycardia  right thumb trauma  family history of cardiac disease .   Plan PLAN  continued telemetry  short term anticoagulation  continue antibiotics for right thumb Continue verapemil for palp Rec cardiac cath in the am. ASA 81 mg po daily May switch b-blockers for palps   Electronic Signatures: Lujean Amel D (MD)  (Signed [phone] 18:20)  Authored: Chief Complaint, VITAL SIGNS/ANCILLARY NOTES, Brief Assessment, Lab Results, Radiology Results, Assessment/Plan   Last Updated: 15-Nov-15 18:20 by Yolonda Kida (MD)

## 2015-03-02 NOTE — Discharge Summary (Signed)
PATIENT NAME:  Morgan Moore, Morgan Moore MR#:  [id number] DATE OF BIRTH:  [birth year]  DATE OF ADMISSION:  09/23/2014 DATE OF DISCHARGE:  09/24/2014  PRESENTING COMPLAINT: Chest pain.   DISCHARGE DIAGNOSES: 1.  Angioedema secondary to Keflex and Bactrim, resolved.  2.  Acute mild non-Q-wave myocardial infarction with elevated troponins, recent dyspnea and chest tightness with cardiac catheterization positive for very minimal coronary artery disease.  3.  Right thumb degloving status post sutures and splint.  4.  Hypokalemia, replaced.  5.  History of Crohn's.  6.  History of tachycardia, on verapamil.   CODE STATUS: FULL code.   CONSULTATIONS: Cardiology, Dr. Clayborn Bigness.   PROCEDURES: Cardiac cath showed minimal CAD, good LV function.   DISCHARGE MEDICATIONS: 1.  Aspirin 81 mg daily.  2.  Verapamil 120 mg daily.  3.  EpiPen as needed.  4.  Oxycodone 5 mg 1 tablet every 4 hours as needed.  5.  Zofran ODT every 8 hours as needed.  6.  Clindamycin 300 mg p.o. t.i.d.  7.  Mesalamine 800 mg t.i.d. as before.   DISCHARGE INSTRUCTIONS: 1.  Follow up with Dr. Clayborn Bigness in 1 to 2 weeks.  2.  Keep your [facility] hand clinic appointment.  3.  Follow up with your primary care physician, Dr. Evette Cristal.  DIAGNOSTIC DATA: Troponins were 0.06, 0.41.   Echo Doppler showed EF of more than 75%, hyperdynamic global left ventricular systolic function, mild LAD, mild RAD, mild to moderate mitral valve regurgitation, mild tricuspid regurgitation.   Potassium is 4.1 and sodium is 143. H and H is 11.4 and 34.4.   BRIEF SUMMARY OF HOSPITAL COURSE: Morgan Moore is a very pleasant 58 year old Caucasian female with history of Crohn disease, high blood pressure. Had traumatic injury to her right thumb, admitted for angioedema and for mild non-Q-wave MI. She was admitted with:  1.  Angioedema to Keflex last week and then to Bactrim. Discontinued both meds. Steroids were stopped. No swelling now, resolving. The patient was  stable.  2.  Right thumb degloving secondary to traumatic injury at home, status post sutures/splint.  Splint was present on the right thumb. Now on clindamycin. The patient is tolerating medication well. She was seen by Dr. Daine Gip, ortho, who recommends follow-up with [facility] hand clinic.  3.  Acute mild non-Q-wave MI with elevated troponin, recent dyspnea and chest tightness as outpatient. The patient was placed on telemetry floor, remained in sinus rhythm, on heparin. She did not require any nitro drip since her chest pain resolved. Given strong family history of sudden cardiac death and MI in siblings at young age, the patient underwent cardiac cath by Dr. Clayborn Bigness which showed very minimal irregularities in the coronaries. No significant stenosis was noted. The patient was continued on aspirin. She remained asymptomatic.  4.  Crohn disease. Stable on mesalamine.  5.  Hypokalemia. Repleted.  Hospital stay otherwise remained stable. The patient remained a FULL code.   TIME SPENT: 40 minutes.   ____________________________ Morgan Rochester Posey Pronto, MD sap:sb D: 09/25/2014 07:09:05 ET T: 09/25/2014 07:48:02 ET JOB#: [id number]  cc: Nary Sneed A. Posey Pronto, MD, <Dictator> Dwayne D. Clayborn Bigness, MD [facility] Evette Cristal, MD Ilda Basset MD ELECTRONICALLY SIGNED 10/11/2014 14:05

## 2015-03-02 NOTE — H&P (Signed)
PATIENT NAME:  Morgan Moore, Morgan Moore MR#:  [id number] DATE OF BIRTH:  12-14-1955  DATE OF ADMISSION:  09/22/2014  REFERRING PHYSICIAN: Elta Guadeloupe R. Jacqualine Code, MD  PRIMARY CARE PHYSICIAN: Marijo File, MD  ADMISSION DIAGNOSIS: Atypical chest pain.   HISTORY OF PRESENT ILLNESS: This is a 59 year old Caucasian female who presents to the Emergency Department complaining of possible allergic reaction. She has been seen in the Emergency Room 2 times for this, and was given intravenous steroids last night for the same. Today, the patient still feels as if her "her windpipe is swollen inside her chest". Her symptoms are not nearly as bad tonight as they were on previous visits. In the course of the interview, the patient admits that she was feeling short of breath up to 4 weeks ago, prior to receiving the antibiotics that were presumably the cause of her allergic reaction. She has developed a cough and hoarseness recently, and admits that her heart rate has been "flying". She has a history of palpitations and arrhythmia. EKG obtained in the Emergency Department showed some non-ST segment depressions in the lateral leads, which prompted a telephone consultation with cardiology, who reviewed the EKG and were concerned for ischemia. Due to her abnormal EKG and atypical presenting symptoms, the Emergency Department called for admission to rule out cardiac ischemia.   REVIEW OF SYSTEMS:  CONSTITUTIONAL: The patient denies fever or weakness.  EYES: Denies inflammation or blurred vision.  EARS, NOSE AND THROAT: Denies tinnitus or sore throat.  RESPIRATORY: Admits to some shortness of breath, cough and swelling of her tongue.  CARDIOVASCULAR: Denies chest pain, but admits to palpitations.  GASTROINTESTINAL: Denies nausea or vomiting.  GENITOURINARY: Denies dysuria, increased frequency, or hesitancy.  ENDOCRINE: Denies polyuria or polydipsia.  HEMATOLOGIC AND LYMPHATIC: Denies easy bruising or bleeding.  INTEGUMENT: Denies  rashes or lesions.  MUSCULOSKELETAL: Denies myalgias or arthralgias.  NEUROLOGIC: Denies numbness in her extremities or dysarthria.  PSYCHIATRIC: Denies depression or suicidal ideation.   PAST MEDICAL HISTORY: Crohn disease, osteopenia, history of migraines, as well as history of deep venous thrombosis.   SURGICAL HISTORY: An open reduction and internal fixation of her left arm as a child. She had bilateral tubal ligation and tonsillectomy with adenoidectomy.   SOCIAL HISTORY: The patient is a Regulatory affairs officer. She lives alone, but has good family support.   FAMILY HISTORY: The patient has a half brother who died of heart disease at age 84. Her mother is deceased of complications following stroke.   MEDICATIONS:  1.  Aspirin 81 mg 1 tablet p.o. daily.  2.  Bactrim double strength 800 mg/160 mg 2 tablets p.o. b.i.d.  3.  EpiPen 2-pack  0.3 mg injection as needed for allergic reaction.  4.  Oxycodone 5 mg 1 tablet p.o. every 4 hours as needed for pain.  5.  Verapamil 120 mg 1 tablet p.o. daily.  6.  Zofran ODT 4 mg 1 disintegrating tablet sublingually every 4 hours as needed for nausea and vomiting.   ALLERGIES: CEPHALEXIN, CODEINE, MECLIZINE, AND PRESUMABLY BACTRIM.  PERTINENT LABORATORY RESULTS AND RADIOGRAPHIC FINDINGS: Serum glucose is 158, BUN 14, creatinine 1.08. Sodium is 141, potassium is 2.9, chloride is 105, bicarbonate is 24, calcium is 8.7, serum albumin is 3.6, alkaline phosphatase is 92, AST is 30, ALT is 42. Initial troponin was negative, but subsequent values have increased to 0.41 and then 0.61, with an elevation of the CK-MB fraction as well. White blood cell count is 20.4, hemoglobin is 12.4, hematocrit is 37.6.  INR is 1.1. Chest x-ray shows no active disease.   PHYSICAL EXAMINATION:  VITAL SIGNS: Temperature is 98.2, pulse 122, respirations 18, blood pressure 173/105, which reduced to 152/70. Pulse oximetry originally 92% on room air, which improved to 95% on room  without intervention.  GENERAL: The patient is alert and oriented x 3, in no apparent distress.  HEENT: Normocephalic, atraumatic. Pupils equal, round, and reactive to light and accommodation. Extraocular movements are intact. Mucous membranes are moist. The uvula as well as tip of the epiglottis were visualized on oropharyngeal exam.  NECK: Trachea is midline. No adenopathy.  CHEST: Symmetric and atraumatic.  CARDIOVASCULAR: Regular rate and rhythm. Normal S1, S2. No rubs, clicks, or murmurs appreciated.  LUNGS: Clear to auscultation bilaterally. Normal effort and excursion.  ABDOMEN: Positive bowel sounds. Soft, nontender, nondistended. No hepatosplenomegaly.  GENITOURINARY: Deferred.  MUSCULOSKELETAL: The patient moves all 4 extremities equally. Strength is 5/5 in upper and lower extremities bilaterally.  SKIN: No rashes or lesions.  EXTREMITIES: No clubbing or cyanosis. There is some mild nonpitting and non tense edema of her ankles.  NEUROLOGIC: Cranial nerves II through XII are grossly intact.  PSYCHIATRIC: Mood is normal. Affect is congruent.   ASSESSMENT AND PLAN: This is a 59 year old female admitted for atypical chest pain.  1.  Chest pain is certainly atypical as the patient actually does not complain of pain at this time, rather she admits to shortness of breath, which may or may not have been exacerbated by her previous allergic reaction. I do not think that she is having allergic reaction tonight, and that perhaps her hoarseness represents some ischemia to the recurrent laryngeal nerve as it branches from the vagus nerve on his way to the heart. Her EKG demonstrates depressions in the lateral leads and Dr. Clayborn Bigness recommended heparinization, which we have started. We will continue to cycle the patient's cardiac enzymes and place her on telemetry.  2.  Allergic reaction. The patient was seen in the Emergency Department yesterday for airway swelling and a rash from [facility]. She was given  Solu-Medrol and actually presented to the Emergency Department with the same symptoms tonight regarding BACTRIM, albeit less severe. Currently her pulse oximetry is normal, and her oropharynx has a normal appearance. We will continue to observe her for signs or symptoms of worsening reaction.  3.  Leukocytosis. This is likely secondary to steroids. She technically meets sepsis criteria at times due to her heart rate, but we will monitor her for signs or symptoms of septicemia.  4.  Poor wound healing. The patient has a degloving right thumb from a traumatic accident. The antibiotics that she had been receiving were presumably for this infection. I have ordered clindamycin, as the patient can hopefully tolerate this without allergic reaction. She is followed by the [facility], who recently placed the patient on an antibiotic regimen. I have not unwrapped the dressing as I do not want to damage the delicate tissue underneath. If the primary team finds it necessary, they may want to check with infectious disease for antibiotic recommendations or for clearance to discontinue antibiotic therapy.  5.  Tachycardia; is sinus at this time. The patient has a history of sinus arrhythmia, as well. We will continue verapamil.  6.  Crohn disease. The patient reports that she takes mesalamine. I will continue this drug. There is no evidence of inflammatory bowel disease flare at this time.  7.  Deep vein thrombosis prophylaxis with heparin.  8.  Gastrointestinal prophylaxis is unnecessary, as the patient is not critically ill.   CODE STATUS: The patient is a Full Code.   TIME SPENT ON ADMISSION ORDERS AND PATIENT CARE: Approximately 45 minutes.    ____________________________ Norva Riffle. Marcille Blanco, MD msd:MT D: 09/22/2014 09:49:48 ET T: 09/22/2014 10:29:18 ET JOB#: [id number]  cc: Norva Riffle. Marcille Blanco, MD, <Dictator> Norva Riffle Jayma Volpi MD ELECTRONICALLY SIGNED  09/27/2014 1:55

## 2015-03-05 ENCOUNTER — Emergency Department
Admit: 2015-03-05 | Disposition: A | Discharge status: Home / Self Care | Payer: Self-pay | Admitting: Emergency Medicine

## 2015-03-11 DIAGNOSIS — R1314 Dysphagia, pharyngoesophageal phase: Secondary | ICD-10-CM | POA: Insufficient documentation

## 2015-05-10 DIAGNOSIS — I83813 Varicose veins of bilateral lower extremities with pain: Secondary | ICD-10-CM | POA: Insufficient documentation

## 2015-10-31 ENCOUNTER — Ambulatory Visit (INDEPENDENT_AMBULATORY_CARE_PROVIDER_SITE_OTHER): Payer: BC Managed Care – PPO | Admitting: Internal Medicine

## 2015-10-31 ENCOUNTER — Encounter: Payer: Self-pay | Admitting: Internal Medicine

## 2015-10-31 VITALS — BP 140/78 | HR 77 | Temp 97.9°F | Ht 62.0 in | Wt 156.0 lb

## 2015-10-31 DIAGNOSIS — I10 Essential (primary) hypertension: Secondary | ICD-10-CM

## 2015-10-31 DIAGNOSIS — E7800 Pure hypercholesterolemia, unspecified: Secondary | ICD-10-CM | POA: Diagnosis not present

## 2015-10-31 DIAGNOSIS — N200 Calculus of kidney: Secondary | ICD-10-CM | POA: Diagnosis not present

## 2015-10-31 DIAGNOSIS — I340 Nonrheumatic mitral (valve) insufficiency: Secondary | ICD-10-CM

## 2015-10-31 DIAGNOSIS — G43009 Migraine without aura, not intractable, without status migrainosus: Secondary | ICD-10-CM | POA: Diagnosis not present

## 2015-10-31 DIAGNOSIS — F439 Reaction to severe stress, unspecified: Secondary | ICD-10-CM

## 2015-10-31 DIAGNOSIS — K219 Gastro-esophageal reflux disease without esophagitis: Secondary | ICD-10-CM

## 2015-10-31 DIAGNOSIS — Z658 Other specified problems related to psychosocial circumstances: Secondary | ICD-10-CM

## 2015-10-31 NOTE — Progress Notes (Signed)
Patient ID: Morgan Moore, female   DOB: 1956/04/12, 59 y.o.   MRN: [id number]   Subjective:    Patient ID: Morgan Moore, female    DOB: Jul 11, 1956, 59 y.o.   MRN: [id number]  HPI  Patient with past history of Crohn's, hypertension, allergies and hypercholesterolemia.  She comes in today to follow up on these issues as well as to establish care.  She is [employer].  She has previously been seeing Dr Evette Cristal - [facility].  She sees Dr Clayborn Bigness.  Has mild MI.  Heart cath reveals no blockages.  Sees Dr Clayborn Bigness every 6 months.  Tries to stay active.  No cardiac symptoms with increased activity or exertion.  No sob.  No acid reflux.  No abdominal pain or cramping.  Bowels stable.  Has a history of migraines.  Takes maxalt prn.  Has not had migraine in 6 months.  Does have a history of uterine prolapse.  Takes verapamil.  States blood pressure controlled.  Sees [facility] - Dr Cephas Darby. Had EGD and colonoscopy 2015.  Two polyps found in colon.  Recommended f/u colonoscopy in five years.  Not taking omeprazole.  Was afraid would have allergic reaction.  We discussed a trial of zantac.  She has had allergic reactions to lisinopril and sulfa - anaphylactic reactions.  Also had reaction to tessalon perles.     Past Medical History  Diagnosis Date  . Heart murmur   . Allergy   . Hypertension   . Hyperlipidemia   . Phlebitis   . Migraines     hormonal, puberty  . Chicken pox   . Nephrolithiasis    Past Surgical History  Procedure Laterality Date  . Tonsillectomy and adenoidectomy  1962  . Tubaligation  1990  . Heart murmur    . Breast biopsy     Family History  Problem Relation Age of Onset  . Heart disease Father   . Heart disease Brother   . Cancer Maternal Aunt    Social History   Social History  . Marital Status: Divorced    Spouse Name: N/A  . Number of Children: N/A  . Years of Education: N/A   Social History Main Topics  . Smoking status:  Never Smoker   . Smokeless tobacco: Never Used  . Alcohol Use: No  . Drug Use: None  . Sexual Activity: Not Asked   Other Topics Concern  . None   Social History Narrative    Outpatient Encounter Prescriptions as of 10/31/2015  Medication Sig  . ALPRAZolam (XANAX) 0.25 MG tablet Take 0.25 mg by mouth at bedtime as needed.  Marland Kitchen EPINEPHrine 0.3 mg/0.3 mL IJ SOAJ injection 0.3 mg as needed.  . Mesalamine (DELZICOL) 400 MG CPDR DR capsule Take 400 mg by mouth daily.  Marland Kitchen omeprazole (PRILOSEC) 40 MG capsule Take 40 mg by mouth daily.  . ondansetron (ZOFRAN) 8 MG tablet Take 8 mg by mouth as needed.  . rizatriptan (MAXALT) 5 MG tablet Take 5 mg by mouth as needed.  . verapamil (CALAN-SR) 120 MG CR tablet Take 120 mg by mouth daily.  . [DISCONTINUED] ALPRAZolam (XANAX) 0.25 MG tablet Take 0.25 mg by mouth at bedtime as needed for anxiety.  Marland Kitchen aspirin EC 81 MG tablet Take 0.81 mg by mouth daily.  . fexofenadine (ALLEGRA) 180 MG tablet Take 180 mg by mouth daily.   No facility-administered encounter medications on file as of 10/31/2015.    Review of Systems  Constitutional: Negative for appetite change and unexpected weight change.  HENT: Negative for congestion and sinus pressure.   Eyes: Negative for discharge and redness.  Respiratory: Negative for cough, chest tightness and shortness of breath.   Cardiovascular: Negative for chest pain, palpitations and leg swelling.  Gastrointestinal: Negative for nausea, vomiting, abdominal pain and diarrhea.  Genitourinary: Negative for dysuria and difficulty urinating.  Musculoskeletal: Negative for back pain and joint swelling.  Skin: Negative for color change and rash.  Neurological: Negative for dizziness, light-headedness and headaches.  Psychiatric/Behavioral:       Increased stress and some reactive depression in dealing with her daughter's unexpected death.  She keeps herself busy.  Does not feel she needs any further intervention at this  time.         Objective:    Physical Exam  Constitutional: She appears well-developed and well-nourished. No distress.  HENT:  Nose: Nose normal.  Mouth/Throat: Oropharynx is clear and moist.  Eyes: Conjunctivae are normal. Right eye exhibits no discharge. Left eye exhibits no discharge.  Neck: Neck supple. No thyromegaly present.  Cardiovascular: Normal rate and regular rhythm.   Pulmonary/Chest: Breath sounds normal. No respiratory distress. She has no wheezes.  Abdominal: Soft. Bowel sounds are normal. There is no tenderness.  Musculoskeletal: She exhibits no edema or tenderness.  Lymphadenopathy:    She has no cervical adenopathy.  Skin: No rash noted. No erythema.  Psychiatric: She has a normal mood and affect. Her behavior is normal.    BP 140/78 mmHg  Pulse 77  Temp(Src) 97.9 F (36.6 C) (Oral)  Ht 5' 2"  (1.575 m)  Wt 156 lb (70.761 kg)  BMI 28.53 kg/m2  SpO2 98% Wt Readings from Last 3 Encounters:  10/31/15 156 lb (70.761 kg)     Lab Results  Component Value Date   WBC 9.8 02/25/2015   HGB 13.8 02/25/2015   HCT 40.4 02/25/2015   PLT 192 02/25/2015   GLUCOSE 100* 02/25/2015   CHOL 191 09/24/2014   TRIG 172 09/24/2014   HDL 50 09/24/2014   LDLCALC 107* 09/24/2014   ALT 20 02/25/2015   AST 25 02/25/2015   NA 135 02/25/2015   K 3.8 02/25/2015   CL 97* 02/25/2015   CREATININE 0.83 02/25/2015   BUN 12 02/25/2015   CO2 29 02/25/2015   INR 1.1 09/21/2014       Assessment & Plan:   Problem List Items Addressed This Visit    Essential hypertension - Primary    Blood pressure as outlined.  Continue same medication regimen.  Have her follow her pressures.   Follow metabolic panel.         Relevant Medications   aspirin EC 81 MG tablet   EPINEPHrine 0.3 mg/0.3 mL IJ SOAJ injection   verapamil (CALAN-SR) 120 MG CR tablet   GERD (gastroesophageal reflux disease)    Had EGD.  Apparently had some irritation.  Not taking omeprazole.  Was afraid of having  an allergic reaction.  Takes TUMS.  Discussed zantac.        Relevant Medications   Mesalamine (DELZICOL) 400 MG CPDR DR capsule   omeprazole (PRILOSEC) 40 MG capsule   ondansetron (ZOFRAN) 8 MG tablet   Hypercholesterolemia    On no cholesterol medication.  Low cholesterol diet and exercise.  Follow lipid panel.        Relevant Medications   aspirin EC 81 MG tablet   EPINEPHrine 0.3 mg/0.3 mL IJ SOAJ injection   verapamil (CALAN-SR)  120 MG CR tablet   Migraine headache    Has had problems with intermittent headaches/migraines.  Relates to hormonal changes and noticed some issues with menopausal.  Mostly occur on the right side.  Will have "wiggly vision" pior to headache.  Has been evaluated by neurology (Dr Paulla Dolly).  Has had MRI.  Takes maxalt prn.        Relevant Medications   aspirin EC 81 MG tablet   EPINEPHrine 0.3 mg/0.3 mL IJ SOAJ injection   rizatriptan (MAXALT) 5 MG tablet   verapamil (CALAN-SR) 120 MG CR tablet   Mitral regurgitation    Has a history of mild mitral regurgitation.  Followed by Dr Clayborn Bigness.        Relevant Medications   aspirin EC 81 MG tablet   EPINEPHrine 0.3 mg/0.3 mL IJ SOAJ injection   verapamil (CALAN-SR) 120 MG CR tablet   Nephrolithiasis    Has a history of kidney stones (x 3).  Able to pass.  Last 4 years ago.        Stress    Increased stress and what appears to be reactive depression as outlined.  Discussed with her.  Desires no further w/up at this time.  Follow.          I spent 45 minutes with the patient and more than 50% of the time was spent in consultation regarding the above.     Einar Pheasant, MD

## 2015-11-07 ENCOUNTER — Encounter: Payer: Self-pay | Admitting: Internal Medicine

## 2015-11-07 ENCOUNTER — Telehealth: Payer: Self-pay | Admitting: Internal Medicine

## 2015-11-07 ENCOUNTER — Other Ambulatory Visit (INDEPENDENT_AMBULATORY_CARE_PROVIDER_SITE_OTHER): Payer: BC Managed Care – PPO

## 2015-11-07 DIAGNOSIS — F439 Reaction to severe stress, unspecified: Secondary | ICD-10-CM | POA: Insufficient documentation

## 2015-11-07 DIAGNOSIS — Z0000 Encounter for general adult medical examination without abnormal findings: Secondary | ICD-10-CM | POA: Diagnosis not present

## 2015-11-07 DIAGNOSIS — N200 Calculus of kidney: Secondary | ICD-10-CM | POA: Insufficient documentation

## 2015-11-07 DIAGNOSIS — K219 Gastro-esophageal reflux disease without esophagitis: Secondary | ICD-10-CM | POA: Insufficient documentation

## 2015-11-07 DIAGNOSIS — I340 Nonrheumatic mitral (valve) insufficiency: Secondary | ICD-10-CM | POA: Insufficient documentation

## 2015-11-07 DIAGNOSIS — E7800 Pure hypercholesterolemia, unspecified: Secondary | ICD-10-CM | POA: Insufficient documentation

## 2015-11-07 DIAGNOSIS — G43909 Migraine, unspecified, not intractable, without status migrainosus: Secondary | ICD-10-CM | POA: Insufficient documentation

## 2015-11-07 DIAGNOSIS — I10 Essential (primary) hypertension: Secondary | ICD-10-CM | POA: Insufficient documentation

## 2015-11-07 LAB — CBC WITH DIFFERENTIAL/PLATELET
BASOS PCT: 0.3 % (ref 0.0–3.0)
Basophils Absolute: 0 10*3/uL (ref 0.0–0.1)
Eosinophils Absolute: 0.2 10*3/uL (ref 0.0–0.7)
Eosinophils Relative: 2.5 % (ref 0.0–5.0)
HCT: 41.1 % (ref 36.0–46.0)
HEMOGLOBIN: 13.8 g/dL (ref 12.0–15.0)
LYMPHS ABS: 1.6 10*3/uL (ref 0.7–4.0)
Lymphocytes Relative: 19.4 % (ref 12.0–46.0)
MCHC: 33.5 g/dL (ref 30.0–36.0)
MCV: 92.5 fl (ref 78.0–100.0)
MONOS PCT: 6.3 % (ref 3.0–12.0)
Monocytes Absolute: 0.5 10*3/uL (ref 0.1–1.0)
NEUTROS ABS: 5.9 10*3/uL (ref 1.4–7.7)
Neutrophils Relative %: 71.5 % (ref 43.0–77.0)
Platelets: 302 10*3/uL (ref 150.0–400.0)
RBC: 4.45 Mil/uL (ref 3.87–5.11)
RDW: 14.2 % (ref 11.5–15.5)
WBC: 8.2 10*3/uL (ref 4.0–10.5)

## 2015-11-07 LAB — LIPID PANEL
CHOL/HDL RATIO: 4
Cholesterol: 254 mg/dL — ABNORMAL HIGH (ref 0–200)
HDL: 56.5 mg/dL (ref >39.00)
LDL Cholesterol: 169 mg/dL — ABNORMAL HIGH (ref 0–99)
NonHDL: 197.35
Triglycerides: 142 mg/dL (ref 0.0–149.0)
VLDL: 28.4 mg/dL (ref 0.0–40.0)

## 2015-11-07 LAB — COMPREHENSIVE METABOLIC PANEL
ALT: 24 U/L (ref 0–35)
AST: 21 U/L (ref 0–37)
Albumin: 4.1 g/dL (ref 3.5–5.2)
Alkaline Phosphatase: 77 U/L (ref 39–117)
BUN: 12 mg/dL (ref 6–23)
CHLORIDE: 104 meq/L (ref 96–112)
CO2: 30 meq/L (ref 19–32)
Calcium: 9.6 mg/dL (ref 8.4–10.5)
Creatinine, Ser: 0.78 mg/dL (ref 0.40–1.20)
GFR: 79.87 mL/min (ref >60.00)
GLUCOSE: 83 mg/dL (ref 70–99)
POTASSIUM: 4.3 meq/L (ref 3.5–5.1)
Sodium: 142 mEq/L (ref 135–145)
Total Bilirubin: 0.5 mg/dL (ref 0.2–1.2)
Total Protein: 6.7 g/dL (ref 6.0–8.3)

## 2015-11-07 LAB — TSH: TSH: 2.59 u[IU]/mL (ref 0.35–4.50)

## 2015-11-07 NOTE — Telephone Encounter (Signed)
Yes these labs are what I wanted.  It looks like they have already been ordered.  Let me know if I need to do anything more.

## 2015-11-07 NOTE — Assessment & Plan Note (Signed)
On no cholesterol medication.  Low cholesterol diet and exercise.  Follow lipid panel.

## 2015-11-07 NOTE — Telephone Encounter (Signed)
Patient in lab fast drew regular tubes what labs I pended cbc,cmet, tsh and lipid

## 2015-11-07 NOTE — Assessment & Plan Note (Signed)
Has a history of kidney stones (x 3).  Able to pass.  Last 4 years ago.

## 2015-11-07 NOTE — Assessment & Plan Note (Signed)
Has a history of mild mitral regurgitation.  Followed by Dr Clayborn Bigness.

## 2015-11-07 NOTE — Assessment & Plan Note (Signed)
Increased stress and what appears to be reactive depression as outlined.  Discussed with her.  Desires no further w/up at this time.  Follow.

## 2015-11-07 NOTE — Assessment & Plan Note (Signed)
Has had problems with intermittent headaches/migraines.  Relates to hormonal changes and noticed some issues with menopausal.  Mostly occur on the right side.  Will have "wiggly vision" pior to headache.  Has been evaluated by neurology (Dr Paulla Dolly).  Has had MRI.  Takes maxalt prn.

## 2015-11-07 NOTE — Assessment & Plan Note (Signed)
Had EGD.  Apparently had some irritation.  Not taking omeprazole.  Was afraid of having an allergic reaction.  Takes TUMS.  Discussed zantac.

## 2015-11-07 NOTE — Assessment & Plan Note (Signed)
Blood pressure as outlined.  Continue same medication regimen.  Have her follow her pressures.   Follow metabolic panel.

## 2015-11-10 DIAGNOSIS — I82409 Acute embolism and thrombosis of unspecified deep veins of unspecified lower extremity: Secondary | ICD-10-CM

## 2015-11-10 HISTORY — DX: Acute embolism and thrombosis of unspecified deep veins of unspecified lower extremity: I82.409

## 2015-11-10 HISTORY — PX: CORONARY ANGIOPLASTY WITH STENT PLACEMENT: SHX49

## 2016-01-07 ENCOUNTER — Other Ambulatory Visit: Payer: Self-pay

## 2016-01-07 MED ORDER — MESALAMINE 400 MG PO CPDR: 400.0000 mg | DELAYED_RELEASE_CAPSULE | Freq: Every day | ORAL | Status: DC

## 2016-01-07 MED ORDER — MESALAMINE 400 MG PO CPDR: 2400.0000 mg | DELAYED_RELEASE_CAPSULE | Freq: Every day | ORAL | Status: DC

## 2016-01-09 ENCOUNTER — Telehealth: Payer: Self-pay

## 2016-01-09 NOTE — Telephone Encounter (Signed)
Received a PA for Delzicol, this medication was previously filled by patient's old PCP at [facility].  It was refilled here and a PA was requested.  In doing research on the patient's medication history and discussion with Dr. Nicki Reaper, this prescription needs to be completed and filled by GI who follows the patient.  I have called the pharmacy to cancel the fill and given them her GI doctor at [facility] to request the refill.

## 2016-01-10 ENCOUNTER — Ambulatory Visit (INDEPENDENT_AMBULATORY_CARE_PROVIDER_SITE_OTHER): Payer: BC Managed Care – PPO | Admitting: Internal Medicine

## 2016-01-10 ENCOUNTER — Encounter: Payer: Self-pay | Admitting: Internal Medicine

## 2016-01-10 VITALS — BP 120/70 | HR 85 | Temp 98.2°F | Resp 14 | Ht 62.0 in | Wt 155.4 lb

## 2016-01-10 DIAGNOSIS — I10 Essential (primary) hypertension: Secondary | ICD-10-CM

## 2016-01-10 DIAGNOSIS — Z658 Other specified problems related to psychosocial circumstances: Secondary | ICD-10-CM

## 2016-01-10 DIAGNOSIS — F439 Reaction to severe stress, unspecified: Secondary | ICD-10-CM

## 2016-01-10 NOTE — Progress Notes (Signed)
Pre visit review using our clinic review tool, if applicable. No additional management support is needed unless otherwise documented below in the visit note. 

## 2016-01-10 NOTE — Progress Notes (Signed)
Patient ID: Morgan Moore, female   DOB: [birth year], 60 y.o.   MRN: [id number]   Subjective:    Patient ID: Morgan Moore, female    DOB: August 18, 1956, 60 y.o.   MRN: [id number]  HPI  Patient with past history of hypercholesterolemia, migraines, CAD and hypertension.  She comes in today as a work in to discuss her blood pressure.  She recently fell at school.  Evaluated and sutures placed for facial wound.  This is being followed by workman's comp.  She has planned f/u this pm.  She was concerned regarding blood pressure varying.  Increased recently.  Overall average of blood pressure 135/80s.  No chest pain or tightness.  No sob.  No abdominal pain or cramping.  Bowels stable..    Past Medical History  Diagnosis Date  . Heart murmur   . Allergy   . Hypertension   . Hyperlipidemia   . Phlebitis   . Migraines     hormonal, puberty  . Chicken pox   . Nephrolithiasis    Past Surgical History  Procedure Laterality Date  . Tonsillectomy and adenoidectomy  1962  . Tubaligation  1990  . Heart murmur    . Breast biopsy     Family History  Problem Relation Age of Onset  . Heart disease Father   . Heart disease Brother   . Cancer Maternal Aunt    Social History   Social History  . Marital Status: Divorced    Spouse Name: N/A  . Number of Children: N/A  . Years of Education: N/A   Social History Main Topics  . Smoking status: Never Smoker   . Smokeless tobacco: Never Used  . Alcohol Use: No  . Drug Use: None  . Sexual Activity: Not Asked   Other Topics Concern  . None   Social History Narrative    Outpatient Encounter Prescriptions as of 01/10/2016  Medication Sig  . ALPRAZolam (XANAX) 0.25 MG tablet Take 0.25 mg by mouth at bedtime as needed.  Marland Kitchen aspirin EC 81 MG tablet Take 0.81 mg by mouth daily.  Marland Kitchen EPINEPHrine 0.3 mg/0.3 mL IJ SOAJ injection 0.3 mg as needed.  . fexofenadine (ALLEGRA) 180 MG tablet Take 180 mg by mouth daily.  . Mesalamine (DELZICOL) 400  MG CPDR DR capsule Take 6 capsules (2,400 mg total) by mouth daily.  Marland Kitchen omeprazole (PRILOSEC) 40 MG capsule Take 40 mg by mouth daily.  . ondansetron (ZOFRAN) 8 MG tablet Take 8 mg by mouth as needed.  . rizatriptan (MAXALT) 5 MG tablet Take 5 mg by mouth as needed.  . verapamil (CALAN-SR) 120 MG CR tablet Take 120 mg by mouth daily.  . clarithromycin (BIAXIN) 500 MG tablet    No facility-administered encounter medications on file as of 01/10/2016.    Review of Systems  Constitutional: Negative for appetite change and unexpected weight change.  HENT: Negative for congestion and sinus pressure.   Respiratory: Negative for cough, chest tightness and shortness of breath.   Cardiovascular: Negative for chest pain, palpitations and leg swelling.  Gastrointestinal: Negative for nausea, vomiting, abdominal pain and diarrhea.  Musculoskeletal: Negative for back pain and joint swelling.  Neurological: Negative for dizziness, light-headedness and headaches.  Psychiatric/Behavioral:       Increased stress.  Still trying to cope with her daughter's death.         Objective:    Physical Exam  Constitutional: She appears well-developed and well-nourished. No distress.  HENT:  Nose: Nose  normal.  Mouth/Throat: Oropharynx is clear and moist.  Eyes: Conjunctivae are normal. Right eye exhibits no discharge. Left eye exhibits no discharge.  Neck: Neck supple. No thyromegaly present.  Cardiovascular: Normal rate and regular rhythm.   Pulmonary/Chest: Breath sounds normal. No respiratory distress. She has no wheezes.  Abdominal: Soft. Bowel sounds are normal. There is no tenderness.  Musculoskeletal: She exhibits no edema or tenderness.  Lymphadenopathy:    She has no cervical adenopathy.  Skin: No rash noted. No erythema.  Facial lesion - right cheek.  Increased soft tissue swelling and bruising.     BP 120/70 mmHg  Pulse 85  Temp(Src) 98.2 F (36.8 C) (Oral)  Resp 14  Ht 5' 2"  (1.575 m)  Wt  155 lb 6.4 oz (70.489 kg)  BMI 28.42 kg/m2  SpO2 97% Wt Readings from Last 3 Encounters:  01/10/16 155 lb 6.4 oz (70.489 kg)  10/31/15 156 lb (70.761 kg)     Lab Results  Component Value Date   WBC 8.2 11/07/2015   HGB 13.8 11/07/2015   HCT 41.1 11/07/2015   PLT 302.0 11/07/2015   GLUCOSE 83 11/07/2015   CHOL 254* 11/07/2015   TRIG 142.0 11/07/2015   HDL 56.50 11/07/2015   LDLCALC 169* 11/07/2015   ALT 24 11/07/2015   AST 21 11/07/2015   NA 142 11/07/2015   K 4.3 11/07/2015   CL 104 11/07/2015   CREATININE 0.78 11/07/2015   BUN 12 11/07/2015   CO2 30 11/07/2015   TSH 2.59 11/07/2015   INR 1.1 09/21/2014       Assessment & Plan:   Problem List Items Addressed This Visit    Essential hypertension - Primary    Blood pressure under good control.  Continue same medication regimen.  Follow pressures.  Follow metabolic panel.        Stress    Discussed with her today.  Does not feel needs anything more at this point.  Follow.            Einar Pheasant, MD

## 2016-01-11 ENCOUNTER — Encounter: Payer: Self-pay | Admitting: Internal Medicine

## 2016-01-11 NOTE — Assessment & Plan Note (Signed)
Discussed with her today.  Does not feel needs anything more at this point.  Follow.

## 2016-01-11 NOTE — Assessment & Plan Note (Signed)
Blood pressure under good control.  Continue same medication regimen.  Follow pressures.  Follow metabolic panel.   

## 2016-01-16 ENCOUNTER — Ambulatory Visit (INDEPENDENT_AMBULATORY_CARE_PROVIDER_SITE_OTHER): Payer: BC Managed Care – PPO | Admitting: Internal Medicine

## 2016-01-16 ENCOUNTER — Other Ambulatory Visit (HOSPITAL_COMMUNITY)
Admission: RE | Admit: 2016-01-16 | Discharge: 2016-01-16 | Disposition: A | Discharge status: Home / Self Care | Payer: BC Managed Care – PPO | Source: Ambulatory Visit | Attending: Internal Medicine | Admitting: Internal Medicine

## 2016-01-16 VITALS — BP 122/72 | HR 77 | Temp 98.0°F | Wt 157.2 lb

## 2016-01-16 DIAGNOSIS — G43009 Migraine without aura, not intractable, without status migrainosus: Secondary | ICD-10-CM | POA: Diagnosis not present

## 2016-01-16 DIAGNOSIS — I340 Nonrheumatic mitral (valve) insufficiency: Secondary | ICD-10-CM

## 2016-01-16 DIAGNOSIS — Z124 Encounter for screening for malignant neoplasm of cervix: Secondary | ICD-10-CM

## 2016-01-16 DIAGNOSIS — Z0000 Encounter for general adult medical examination without abnormal findings: Secondary | ICD-10-CM | POA: Diagnosis not present

## 2016-01-16 DIAGNOSIS — Z01419 Encounter for gynecological examination (general) (routine) without abnormal findings: Secondary | ICD-10-CM | POA: Diagnosis present

## 2016-01-16 DIAGNOSIS — E7800 Pure hypercholesterolemia, unspecified: Secondary | ICD-10-CM

## 2016-01-16 DIAGNOSIS — F439 Reaction to severe stress, unspecified: Secondary | ICD-10-CM

## 2016-01-16 DIAGNOSIS — I10 Essential (primary) hypertension: Secondary | ICD-10-CM

## 2016-01-16 DIAGNOSIS — Z1151 Encounter for screening for human papillomavirus (HPV): Secondary | ICD-10-CM | POA: Insufficient documentation

## 2016-01-16 DIAGNOSIS — Z658 Other specified problems related to psychosocial circumstances: Secondary | ICD-10-CM

## 2016-01-16 MED ORDER — ALPRAZOLAM 0.25 MG PO TABS: 0.2500 mg | ORAL_TABLET | Freq: Every evening | ORAL | Status: DC | PRN

## 2016-01-16 MED ORDER — ONDANSETRON 4 MG PO TBDP: 4.0000 mg | ORAL_TABLET | Freq: Three times a day (TID) | ORAL | Status: DC | PRN

## 2016-01-16 MED ORDER — RIZATRIPTAN BENZOATE 5 MG PO TABS: ORAL_TABLET | ORAL | Status: DC

## 2016-01-16 MED ORDER — VERAPAMIL HCL ER 120 MG PO TBCR: 120.0000 mg | EXTENDED_RELEASE_TABLET | Freq: Every day | ORAL | Status: DC

## 2016-01-16 NOTE — Progress Notes (Signed)
Pre visit review using our clinic review tool, if applicable. No additional management support is needed unless otherwise documented below in the visit note. 

## 2016-01-16 NOTE — Progress Notes (Signed)
Patient ID: Morgan Moore, female   DOB: 05-Sep-1956, 60 y.o.   MRN: [id number]   Subjective:    Patient ID: Morgan Moore, female    DOB: 03-16-1956, 60 y.o.   MRN: [id number]  HPI  Patient with past history of hypercholesterolemia and hypertension.  She comes in today to follow up on these issues as well as for a complete physical exam.  She recently fell at work.  Being followed for workman's comp.  Has facial laceration.  They are following.  She tries to stay active.  Works.  No cardiac symptoms with increased activity or exertion.  No sob.  No acid reflux.  No abdominal pain or cramping.  Bowels stable.  Increased stress.  Still trying to cope with her daughter's death.     Past Medical History  Diagnosis Date  . Heart murmur   . Allergy   . Hypertension   . Hyperlipidemia   . Phlebitis   . Migraines     hormonal, puberty  . Chicken pox   . Nephrolithiasis    Past Surgical History  Procedure Laterality Date  . Tonsillectomy and adenoidectomy  1962  . Tubaligation  1990  . Heart murmur    . Breast biopsy     Family History  Problem Relation Age of Onset  . Heart disease Father   . Heart disease Brother   . Cancer Maternal Aunt    Social History   Social History  . Marital Status: Divorced    Spouse Name: N/A  . Number of Children: N/A  . Years of Education: N/A   Social History Main Topics  . Smoking status: Never Smoker   . Smokeless tobacco: Never Used  . Alcohol Use: No  . Drug Use: None  . Sexual Activity: Not Asked   Other Topics Concern  . None   Social History Narrative    Outpatient Encounter Prescriptions as of 01/16/2016  Medication Sig  . ALPRAZolam (XANAX) 0.25 MG tablet Take 1 tablet (0.25 mg total) by mouth at bedtime as needed.  Marland Kitchen aspirin EC 81 MG tablet Take 0.81 mg by mouth daily.  Marland Kitchen EPINEPHrine 0.3 mg/0.3 mL IJ SOAJ injection 0.3 mg as needed.  . fexofenadine (ALLEGRA) 180 MG tablet Take 180 mg by mouth daily.  . Mesalamine  (DELZICOL) 400 MG CPDR DR capsule Take 6 capsules (2,400 mg total) by mouth daily.  Marland Kitchen omeprazole (PRILOSEC) 40 MG capsule Take 40 mg by mouth daily.  . rizatriptan (MAXALT) 5 MG tablet Use as directed  . verapamil (CALAN-SR) 120 MG CR tablet Take 1 tablet (120 mg total) by mouth daily.  . [DISCONTINUED] ALPRAZolam (XANAX) 0.25 MG tablet Take 0.25 mg by mouth at bedtime as needed.  . [DISCONTINUED] clarithromycin (BIAXIN) 500 MG tablet   . [DISCONTINUED] ondansetron (ZOFRAN) 8 MG tablet Take 8 mg by mouth as needed.  . [DISCONTINUED] rizatriptan (MAXALT) 5 MG tablet Take 5 mg by mouth as needed.  . [DISCONTINUED] verapamil (CALAN-SR) 120 MG CR tablet Take 120 mg by mouth daily.  . ondansetron (ZOFRAN ODT) 4 MG disintegrating tablet Take 1 tablet (4 mg total) by mouth every 8 (eight) hours as needed for nausea or vomiting.   No facility-administered encounter medications on file as of 01/16/2016.    Review of Systems  Constitutional: Negative for appetite change and unexpected weight change.  HENT: Negative for congestion and sinus pressure.   Respiratory: Negative for cough, chest tightness and shortness of breath.   Cardiovascular:  Negative for chest pain, palpitations and leg swelling.  Gastrointestinal: Negative for nausea, vomiting, abdominal pain and diarrhea.  Genitourinary: Negative for dysuria and difficulty urinating.  Musculoskeletal: Negative for back pain and joint swelling.  Skin: Negative for color change and rash.  Neurological: Negative for dizziness, light-headedness and headaches.  Psychiatric/Behavioral: Negative for dysphoric mood and agitation.       Objective:    Physical Exam  Constitutional: She is oriented to person, place, and time. She appears well-developed and well-nourished. No distress.  HENT:  Nose: Nose normal.  Mouth/Throat: Oropharynx is clear and moist.  Eyes: Conjunctivae are normal. Right eye exhibits no discharge. Left eye exhibits no discharge.  No scleral icterus.  Neck: Neck supple. No thyromegaly present.  Cardiovascular: Normal rate and regular rhythm.   Pulmonary/Chest: Breath sounds normal. No accessory muscle usage. No tachypnea. No respiratory distress. She has no decreased breath sounds. She has no wheezes. She has no rhonchi. Right breast exhibits no inverted nipple, no mass, no nipple discharge and no tenderness (no axillary adenopathy). Left breast exhibits no inverted nipple, no mass, no nipple discharge and no tenderness (no axilarry adenopathy).  Abdominal: Soft. Bowel sounds are normal. There is no tenderness.  Genitourinary:  Normal external genitalia.  Vaginal vault without lesions.  Cervix identified.  Pap smear performed.  Could not appreciate any adnexal masses or tenderness.    Musculoskeletal: She exhibits no edema or tenderness.  Lymphadenopathy:    She has no cervical adenopathy.  Neurological: She is alert and oriented to person, place, and time.  Skin: Skin is warm. No rash noted. No erythema.  Laceration - right cheek.   Psychiatric: She has a normal mood and affect. Her behavior is normal.    BP 122/72 mmHg  Pulse 77  Temp(Src) 98 F (36.7 C) (Oral)  Wt 157 lb 4 oz (71.328 kg)  SpO2 97% Wt Readings from Last 3 Encounters:  01/16/16 157 lb 4 oz (71.328 kg)  01/10/16 155 lb 6.4 oz (70.489 kg)  10/31/15 156 lb (70.761 kg)     Lab Results  Component Value Date   WBC 8.2 11/07/2015   HGB 13.8 11/07/2015   HCT 41.1 11/07/2015   PLT 302.0 11/07/2015   GLUCOSE 83 11/07/2015   CHOL 254* 11/07/2015   TRIG 142.0 11/07/2015   HDL 56.50 11/07/2015   LDLCALC 169* 11/07/2015   ALT 24 11/07/2015   AST 21 11/07/2015   NA 142 11/07/2015   K 4.3 11/07/2015   CL 104 11/07/2015   CREATININE 0.78 11/07/2015   BUN 12 11/07/2015   CO2 30 11/07/2015   TSH 2.59 11/07/2015   INR 1.1 09/21/2014       Assessment & Plan:   Problem List Items Addressed This Visit    Essential hypertension    Blood  pressure under good control.  Continue same medication regimen.  Follow pressures.  Follow metabolic panel.        Relevant Medications   verapamil (CALAN-SR) 120 MG CR tablet   Other Relevant Orders   Comprehensive metabolic panel   Health care maintenance    Physical today 01/16/16.  PAP 01/14/16.  Schedule mammogram if due.  Need results of last mammo.  Schedule bone density.  Colonoscopy 04/2013.        Hypercholesterolemia    Low cholesterol diet and exercise.  Follow lipid panel.  On no medication.        Relevant Medications   verapamil (CALAN-SR) 120 MG CR tablet  Other Relevant Orders   Lipid panel   Migraine headache    Stable.  Has seen neurology.        Relevant Medications   verapamil (CALAN-SR) 120 MG CR tablet   rizatriptan (MAXALT) 5 MG tablet   Mitral regurgitation    Followed by Dr Clayborn Bigness.  Mild MR.       Relevant Medications   verapamil (CALAN-SR) 120 MG CR tablet   Stress    Increased stress with trying to cope with her daughter's death.  Discussed with her today.  Follow.  Desires no further intervention at this time.         Other Visit Diagnoses    Routine general medical examination at a health care facility    -  Primary    Pap smear for cervical cancer screening        Relevant Orders    Cytology - PAP        Einar Pheasant, MD

## 2016-01-19 ENCOUNTER — Encounter: Payer: Self-pay | Admitting: Internal Medicine

## 2016-01-19 DIAGNOSIS — Z0000 Encounter for general adult medical examination without abnormal findings: Secondary | ICD-10-CM | POA: Insufficient documentation

## 2016-01-19 NOTE — Assessment & Plan Note (Signed)
Stable.  Has seen neurology.

## 2016-01-19 NOTE — Assessment & Plan Note (Signed)
Increased stress with trying to cope with her daughter's death.  Discussed with her today.  Follow.  Desires no further intervention at this time.

## 2016-01-19 NOTE — Assessment & Plan Note (Signed)
Low cholesterol diet and exercise.  Follow lipid panel.  On no medication.

## 2016-01-19 NOTE — Assessment & Plan Note (Signed)
Followed by Dr Clayborn Bigness.  Mild MR.

## 2016-01-19 NOTE — Assessment & Plan Note (Signed)
Blood pressure under good control.  Continue same medication regimen.  Follow pressures.  Follow metabolic panel.   

## 2016-01-19 NOTE — Assessment & Plan Note (Signed)
Physical today 01/16/16.  PAP 01/14/16.  Schedule mammogram if due.  Need results of last mammo.  Schedule bone density.  Colonoscopy 04/2013.

## 2016-01-20 LAB — CYTOLOGY - PAP

## 2016-01-21 ENCOUNTER — Encounter: Payer: Self-pay | Admitting: *Deleted

## 2016-01-29 ENCOUNTER — Telehealth: Payer: Self-pay

## 2016-01-29 NOTE — Telephone Encounter (Signed)
Morgan Moore, please call her with this message thanks!

## 2016-01-29 NOTE — Telephone Encounter (Signed)
[id number]

## 2016-01-29 NOTE — Telephone Encounter (Signed)
I assume she is no longer in the office.  If not, then - regarding her new crohn's medication, she needs to let her gastroenterologist know of the problems she is having.  They prescribe the medication.  May need to be evaluated.  Regarding her facial laceration, I would recommend her have the workman's comp physician reevaluate.  If needs referral, can request to see Dr Nadeen Landau [facility]).  I do not mind seeing her, but she has preferred to keep her workman's comp issue separate from our visits.

## 2016-01-29 NOTE — Telephone Encounter (Signed)
Patient walked in, concerned about her new crohn's medication that insurance stated she needed to try first.  She has been on it for 3 weeks and is feeling miserable.  She is having an upset stomach, diarrhea and it is effecting her daily living and work balance.  Please advise,   She is also concerned about her facial laceration.  Currently it is open to air with two small steri-strips intact.  Scabbing noted, lower portion of the area is slightly redden, no drainage.  Noticeable swelling still noted under her eye.  Patient has been following up with Worker comp provider for this, but would like Dr. Bary Leriche suggestion on what she can put on it or if she needs to put anything on it.  Wants to know if it is going to be okay.   Wants to know if she should be proactively wanting to see a dermatologist/plastic surgeon or specialist? If so, who would you recommend.  Thanks

## 2016-01-30 NOTE — Telephone Encounter (Signed)
Pt states that she is also fine with seeing a doctor at [facility] first.

## 2016-01-30 NOTE — Telephone Encounter (Signed)
I see she is going to call GI about the medication, but I do not understand if she wants the referral to Dr Carlis Abbott.  For workman's comp - she may need to be referred by workman's comp md.

## 2016-01-30 NOTE — Telephone Encounter (Signed)
Notified pt of Dr. Bary Leriche comments, pt states she will call her GI doctor and she is fine with seeing Dr. Carlis Abbott. Please advise,thanks

## 2016-01-30 NOTE — Telephone Encounter (Signed)
Notified pt. 

## 2016-04-07 ENCOUNTER — Ambulatory Visit: Payer: BC Managed Care – PPO | Admitting: Internal Medicine

## 2016-04-09 ENCOUNTER — Other Ambulatory Visit (INDEPENDENT_AMBULATORY_CARE_PROVIDER_SITE_OTHER): Payer: BC Managed Care – PPO

## 2016-04-09 DIAGNOSIS — I10 Essential (primary) hypertension: Secondary | ICD-10-CM | POA: Diagnosis not present

## 2016-04-09 DIAGNOSIS — E7800 Pure hypercholesterolemia, unspecified: Secondary | ICD-10-CM | POA: Diagnosis not present

## 2016-04-09 LAB — COMPREHENSIVE METABOLIC PANEL
ALK PHOS: 66 U/L (ref 39–117)
ALT: 16 U/L (ref 0–35)
AST: 17 U/L (ref 0–37)
Albumin: 4.3 g/dL (ref 3.5–5.2)
BUN: 17 mg/dL (ref 6–23)
CALCIUM: 9.5 mg/dL (ref 8.4–10.5)
CO2: 29 mEq/L (ref 19–32)
Chloride: 105 mEq/L (ref 96–112)
Creatinine, Ser: 0.83 mg/dL (ref 0.40–1.20)
GFR: 74.24 mL/min (ref >60.00)
GLUCOSE: 88 mg/dL (ref 70–99)
POTASSIUM: 4.1 meq/L (ref 3.5–5.1)
Sodium: 141 mEq/L (ref 135–145)
TOTAL PROTEIN: 7 g/dL (ref 6.0–8.3)
Total Bilirubin: 0.5 mg/dL (ref 0.2–1.2)

## 2016-04-09 LAB — LIPID PANEL
CHOL/HDL RATIO: 5
Cholesterol: 258 mg/dL — ABNORMAL HIGH (ref 0–200)
HDL: 54.2 mg/dL (ref >39.00)
LDL Cholesterol: 178 mg/dL — ABNORMAL HIGH (ref 0–99)
NONHDL: 204.29
TRIGLYCERIDES: 131 mg/dL (ref 0.0–149.0)
VLDL: 26.2 mg/dL (ref 0.0–40.0)

## 2016-04-13 ENCOUNTER — Ambulatory Visit
Admission: RE | Admit: 2016-04-13 | Discharge: 2016-04-13 | Disposition: A | Discharge status: Home / Self Care | Payer: BC Managed Care – PPO | Source: Ambulatory Visit | Attending: Internal Medicine | Admitting: Internal Medicine

## 2016-04-13 ENCOUNTER — Telehealth: Payer: Self-pay | Admitting: *Deleted

## 2016-04-13 ENCOUNTER — Ambulatory Visit (INDEPENDENT_AMBULATORY_CARE_PROVIDER_SITE_OTHER): Payer: BC Managed Care – PPO | Admitting: Internal Medicine

## 2016-04-13 ENCOUNTER — Encounter: Payer: Self-pay | Admitting: Internal Medicine

## 2016-04-13 VITALS — BP 128/84 | HR 68 | Temp 98.4°F | Ht 62.0 in | Wt 156.6 lb

## 2016-04-13 DIAGNOSIS — E7800 Pure hypercholesterolemia, unspecified: Secondary | ICD-10-CM | POA: Diagnosis not present

## 2016-04-13 DIAGNOSIS — R0789 Other chest pain: Secondary | ICD-10-CM

## 2016-04-13 DIAGNOSIS — R42 Dizziness and giddiness: Secondary | ICD-10-CM

## 2016-04-13 DIAGNOSIS — I10 Essential (primary) hypertension: Secondary | ICD-10-CM

## 2016-04-13 DIAGNOSIS — M25552 Pain in left hip: Secondary | ICD-10-CM | POA: Diagnosis present

## 2016-04-13 DIAGNOSIS — M1612 Unilateral primary osteoarthritis, left hip: Secondary | ICD-10-CM | POA: Diagnosis not present

## 2016-04-13 DIAGNOSIS — K219 Gastro-esophageal reflux disease without esophagitis: Secondary | ICD-10-CM

## 2016-04-13 DIAGNOSIS — F439 Reaction to severe stress, unspecified: Secondary | ICD-10-CM

## 2016-04-13 DIAGNOSIS — I2510 Atherosclerotic heart disease of native coronary artery without angina pectoris: Secondary | ICD-10-CM

## 2016-04-13 DIAGNOSIS — M542 Cervicalgia: Secondary | ICD-10-CM

## 2016-04-13 DIAGNOSIS — M2578 Osteophyte, vertebrae: Secondary | ICD-10-CM | POA: Diagnosis not present

## 2016-04-13 DIAGNOSIS — M25559 Pain in unspecified hip: Secondary | ICD-10-CM | POA: Insufficient documentation

## 2016-04-13 DIAGNOSIS — Z658 Other specified problems related to psychosocial circumstances: Secondary | ICD-10-CM

## 2016-04-13 MED ORDER — ROSUVASTATIN CALCIUM 5 MG PO TABS: 5.0000 mg | ORAL_TABLET | Freq: Every day | ORAL | Status: DC

## 2016-04-13 NOTE — Telephone Encounter (Signed)
Pt was in for chest and left hip pain. There are no notes on her neck being in pain. Please advise if the X-Ray could be ordered?

## 2016-04-13 NOTE — Progress Notes (Signed)
Patient ID: Morgan Moore, female   DOB: 04-27-1956, 59 y.o.   MRN: [id number]   Subjective:    Patient ID: Morgan Moore, female    DOB: Aug 25, 1956, 60 y.o.   MRN: [id number]  HPI  Patient here for a scheduled follow up.  She is doing better from her fall.  Her face is healing.  She reports noticing some occasional light headedness.  No headache.  She also reports noticing an occasional squeezing sensation in her chest.  This occurs while she is working.  Last for a few minutes and then resolves.  She stays active.  Had heart cath 09/2014.  Sees Dr Clayborn Bigness.  She also reports that she is not taking omeprazole.  I had suggested she try zantac.  She has not started.  Describes some acid reflux at night.  She is afraid she will have a reaction to the medication.  Her swallowing is better.  She is taking allegra.  She also has been taking increased TUMS.  She also reports some left hip pain and pain extending into her left anterior thigh.  Some lower back pain associated.  No numbness or tingling extending down the leg.    Past Medical History  Diagnosis Date  . Heart murmur   . Allergy   . Hypertension   . Hyperlipidemia   . Phlebitis   . Migraines     hormonal, puberty  . Chicken pox   . Nephrolithiasis    Past Surgical History  Procedure Laterality Date  . Tonsillectomy and adenoidectomy  1962  . Tubaligation  1990  . Heart murmur    . Breast biopsy     Family History  Problem Relation Age of Onset  . Heart disease Father   . Heart disease Brother   . Cancer Maternal Aunt    Social History   Social History  . Marital Status: Divorced    Spouse Name: N/A  . Number of Children: N/A  . Years of Education: N/A   Social History Main Topics  . Smoking status: Never Smoker   . Smokeless tobacco: Never Used  . Alcohol Use: No  . Drug Use: None  . Sexual Activity: Not Asked   Other Topics Concern  . None   Social History Narrative    Outpatient Encounter  Prescriptions as of 04/13/2016  Medication Sig  . ALPRAZolam (XANAX) 0.25 MG tablet Take 1 tablet (0.25 mg total) by mouth at bedtime as needed.  Marland Kitchen aspirin EC 81 MG tablet Take 0.81 mg by mouth daily.  Marland Kitchen EPINEPHrine 0.3 mg/0.3 mL IJ SOAJ injection 0.3 mg as needed.  . fexofenadine (ALLEGRA) 180 MG tablet Take 180 mg by mouth daily.  . Mesalamine (DELZICOL) 400 MG CPDR DR capsule Take 6 capsules (2,400 mg total) by mouth daily.  . ondansetron (ZOFRAN ODT) 4 MG disintegrating tablet Take 1 tablet (4 mg total) by mouth every 8 (eight) hours as needed for nausea or vomiting.  . rizatriptan (MAXALT) 5 MG tablet Use as directed  . verapamil (CALAN-SR) 120 MG CR tablet Take 1 tablet (120 mg total) by mouth daily.  Marland Kitchen omeprazole (PRILOSEC) 40 MG capsule Take 40 mg by mouth daily. Reported on 04/13/2016  . rosuvastatin (CRESTOR) 5 MG tablet Take 1 tablet (5 mg total) by mouth daily.   No facility-administered encounter medications on file as of 04/13/2016.    Review of Systems  Constitutional: Negative for appetite change and unexpected weight change.  HENT: Negative for congestion and  sinus pressure.   Respiratory: Positive for chest tightness. Negative for cough and shortness of breath.   Cardiovascular: Positive for chest pain. Negative for palpitations and leg swelling.  Gastrointestinal: Negative for nausea, vomiting, abdominal pain and diarrhea.  Genitourinary: Negative for dysuria and difficulty urinating.  Musculoskeletal: Positive for back pain.       Left hip and left anterior thigh pain.   Skin: Negative for color change and rash.  Neurological: Positive for light-headedness. Negative for dizziness and headaches.  Psychiatric/Behavioral: Negative for dysphoric mood and agitation.       Increased stress with dealing with her daughter's death.         Objective:    Physical Exam  Constitutional: She appears well-developed and well-nourished. No distress.  HENT:  Nose: Nose normal.    Mouth/Throat: Oropharynx is clear and moist.  Neck: Neck supple. No thyromegaly present.  Cardiovascular: Normal rate and regular rhythm.   Pulmonary/Chest: Breath sounds normal. No respiratory distress. She has no wheezes.  Abdominal: Soft. Bowel sounds are normal. There is no tenderness.  Musculoskeletal: She exhibits no edema or tenderness.  Increased pain in her left anterior thigh and lateral hip with straight leg raise.    Lymphadenopathy:    She has no cervical adenopathy.  Skin: No rash noted. No erythema.  Psychiatric: She has a normal mood and affect. Her behavior is normal.    BP 128/84 mmHg  Pulse 68  Temp(Src) 98.4 F (36.9 C) (Oral)  Ht 5' 2"  (1.575 m)  Wt 156 lb 9.6 oz (71.033 kg)  BMI 28.64 kg/m2  SpO2 98% Wt Readings from Last 3 Encounters:  04/13/16 156 lb 9.6 oz (71.033 kg)  01/16/16 157 lb 4 oz (71.328 kg)  01/10/16 155 lb 6.4 oz (70.489 kg)     Lab Results  Component Value Date   WBC 8.2 11/07/2015   HGB 13.8 11/07/2015   HCT 41.1 11/07/2015   PLT 302.0 11/07/2015   GLUCOSE 88 04/09/2016   CHOL 258* 04/09/2016   TRIG 131.0 04/09/2016   HDL 54.20 04/09/2016   LDLCALC 178* 04/09/2016   ALT 16 04/09/2016   AST 17 04/09/2016   NA 141 04/09/2016   K 4.1 04/09/2016   CL 105 04/09/2016   CREATININE 0.83 04/09/2016   BUN 17 04/09/2016   CO2 29 04/09/2016   TSH 2.59 11/07/2015   INR 1.1 09/21/2014       Assessment & Plan:   Problem List Items Addressed This Visit    CAD (coronary artery disease)    Pt with intermittent squeezing sensation in her chest.  Has history of previous MI per pt.  Sees Dr Mariam Dollar.  EKG obtained and revealed SR with no acute ischemic changes.  Treat cholesterol.  Aggressive risk factor modification.  Refer back to cardiology for evaluation and further testing.  Pt in agreement.        Relevant Medications   rosuvastatin (CRESTOR) 5 MG tablet   Other Relevant Orders   Ambulatory referral to Cardiology   Essential  hypertension    Blood pressure on my recheck today was a little elevated.  Have her spot check her pressure and record readings.  Follow.  Hold on adding medication at this time.        Relevant Medications   rosuvastatin (CRESTOR) 5 MG tablet   GERD (gastroesophageal reflux disease)    Had previous EGD.  Some irritation.  She is not taking omeprazole.  Taking TUMS.  Discussed taking zantac.  She is due to see GI this week.  Plans to discuss with them.        Hypercholesterolemia    Discussed recent cholesterol results.  Low cholesterol diet.  She agreed to start crestor.  Follow lipid panel and liver function tests.   Lab Results  Component Value Date   CHOL 258* 04/09/2016   HDL 54.20 04/09/2016   LDLCALC 178* 04/09/2016   TRIG 131.0 04/09/2016   CHOLHDL 5 04/09/2016        Relevant Medications   rosuvastatin (CRESTOR) 5 MG tablet   Other Relevant Orders   Hepatic function panel   Left hip pain    Persistent left hip and left anterior thigh pain.  Check xray.  May need referral to therapy.        Relevant Orders   DG Lumbar Spine 2-3 Views (Completed)   DG HIP UNILAT WITH PELVIS 2-3 VIEWS LEFT (Completed)   Light headedness    Discussed further w/up.  She declines at this time.  Follow blood pressure.  Pursue cardiology w/up.  Follow.       Stress    Increased stress with coping with her daughter's death.  Discussed with her today.  Follow.        Other Visit Diagnoses    Other chest pain    -  Primary    Relevant Orders    EKG 12-Lead (Completed)    Ambulatory referral to Cardiology      I spent 40 minutes with the patient and more than 50% of the time was spent in consultation regarding the above.     Einar Pheasant, MD

## 2016-04-13 NOTE — Telephone Encounter (Signed)
I have sent in the prescription for crestor.

## 2016-04-13 NOTE — Telephone Encounter (Signed)
Pt notified of her prescription.

## 2016-04-13 NOTE — Telephone Encounter (Signed)
Please let patient know it was sent, thanks

## 2016-04-13 NOTE — Telephone Encounter (Signed)
Pt was notified that order was placed.

## 2016-04-13 NOTE — Telephone Encounter (Signed)
[facility] has requested the neck Xray order for patient. Patient stated that she spoke with Dr. Nicki Reaper about her neck pain  [facility] [phone]

## 2016-04-13 NOTE — Progress Notes (Signed)
Pre visit review using our clinic review tool, if applicable. No additional management support is needed unless otherwise documented below in the visit note. 

## 2016-04-13 NOTE — Telephone Encounter (Signed)
Order placed for neck xray.  Please notify them ok.

## 2016-04-13 NOTE — Patient Instructions (Signed)
Zantac (ranitidine) 170m take one tablet 30 minutes before breakfast

## 2016-04-13 NOTE — Telephone Encounter (Signed)
Please advise, I don't see Crestor on her med list, has it been sent?

## 2016-04-13 NOTE — Telephone Encounter (Signed)
Patient wanted to reassure that her Crestor was sent to her pharmacy after her ov today

## 2016-04-15 ENCOUNTER — Other Ambulatory Visit: Payer: Self-pay | Admitting: Internal Medicine

## 2016-04-15 DIAGNOSIS — M25552 Pain in left hip: Secondary | ICD-10-CM

## 2016-04-15 NOTE — Progress Notes (Signed)
Order placed for PT referral.  

## 2016-04-17 ENCOUNTER — Encounter: Payer: Self-pay | Admitting: Internal Medicine

## 2016-04-17 DIAGNOSIS — I2510 Atherosclerotic heart disease of native coronary artery without angina pectoris: Secondary | ICD-10-CM | POA: Insufficient documentation

## 2016-04-17 DIAGNOSIS — I25118 Atherosclerotic heart disease of native coronary artery with other forms of angina pectoris: Secondary | ICD-10-CM | POA: Insufficient documentation

## 2016-04-17 DIAGNOSIS — R42 Dizziness and giddiness: Secondary | ICD-10-CM | POA: Insufficient documentation

## 2016-04-17 NOTE — Assessment & Plan Note (Signed)
Pt with intermittent squeezing sensation in her chest.  Has history of previous MI per pt.  Sees Dr Mariam Dollar.  EKG obtained and revealed SR with no acute ischemic changes.  Treat cholesterol.  Aggressive risk factor modification.  Refer back to cardiology for evaluation and further testing.  Pt in agreement.

## 2016-04-17 NOTE — Assessment & Plan Note (Signed)
Had previous EGD.  Some irritation.  She is not taking omeprazole.  Taking TUMS.  Discussed taking zantac.  She is due to see GI this week.  Plans to discuss with them.

## 2016-04-17 NOTE — Assessment & Plan Note (Signed)
Blood pressure on my recheck today was a little elevated.  Have her spot check her pressure and record readings.  Follow.  Hold on adding medication at this time.

## 2016-04-17 NOTE — Assessment & Plan Note (Signed)
Discussed further w/up.  She declines at this time.  Follow blood pressure.  Pursue cardiology w/up.  Follow.

## 2016-04-17 NOTE — Assessment & Plan Note (Signed)
Persistent left hip and left anterior thigh pain.  Check xray.  May need referral to therapy.

## 2016-04-17 NOTE — Assessment & Plan Note (Addendum)
Discussed recent cholesterol results.  Low cholesterol diet.  She agreed to start crestor.  Follow lipid panel and liver function tests.   Lab Results  Component Value Date   CHOL 258* 04/09/2016   HDL 54.20 04/09/2016   LDLCALC 178* 04/09/2016   TRIG 131.0 04/09/2016   CHOLHDL 5 04/09/2016

## 2016-04-17 NOTE — Assessment & Plan Note (Signed)
Increased stress with coping with her daughter's death.  Discussed with her today.  Follow.

## 2016-04-28 ENCOUNTER — Other Ambulatory Visit: Payer: Self-pay | Admitting: Internal Medicine

## 2016-05-26 ENCOUNTER — Other Ambulatory Visit: Payer: BC Managed Care – PPO

## 2016-06-05 ENCOUNTER — Other Ambulatory Visit: Payer: BC Managed Care – PPO

## 2016-06-07 DIAGNOSIS — I214 Non-ST elevation (NSTEMI) myocardial infarction: Secondary | ICD-10-CM

## 2016-06-07 HISTORY — DX: Non-ST elevation (NSTEMI) myocardial infarction: I21.4

## 2016-06-08 HISTORY — PX: CORONARY ANGIOPLASTY WITH STENT PLACEMENT: SHX49

## 2016-06-29 ENCOUNTER — Telehealth: Payer: Self-pay | Admitting: Internal Medicine

## 2016-06-29 NOTE — Telephone Encounter (Signed)
Pt called about going to the ER yesterday and was released 2 this morning. Pt went in for her heart was racing and was about to pass out. Pt had a UTI. A stent was put in three weeks ago in her heart. Pt needs a follow up appt this week with her PCP. No appt avail let me know where to sch pt she would like to be seen on Wednesday. I did inform pt that Dr Nicki Reaper is out of the office. Please advise?  Call pt @ [phone] or [phone]. Thank you!

## 2016-06-29 NOTE — Telephone Encounter (Signed)
Spoke with patient and she is going to follow up with Dr. Clayborn Bigness for the stents that was placed, but would like to follow up with Dr. Nicki Reaper for the UTI. I advised patient that Dr. Nicki Reaper was not in the clinic so it may be Wednesday before I know if there is an opening. Offered appointment with another provider, but patient refused.

## 2016-06-29 NOTE — Telephone Encounter (Signed)
Pt sched for 8/24 @ 12

## 2016-06-29 NOTE — Telephone Encounter (Signed)
Caryl Pina can you please do this for Dr. Nicki Reaper

## 2016-06-29 NOTE — Telephone Encounter (Signed)
I have got to leave for a meeting on Thursday and cannot add pt.  My 1/2 day is Wednesday and they have already added pts through until 2:00.  In looking at my schedule, you can see if Caryl Pina can move the pt scheduled at 12:00 on Thursday (this is one of Dr Thomes Dinning pts and was just put in to establish care with me).  If we can put this pt out some, since no acute issues and have Ms Mix come in at 12:00 on Thursday 07/02/16.  Thanks

## 2016-07-02 ENCOUNTER — Encounter: Payer: Self-pay | Admitting: Internal Medicine

## 2016-07-02 ENCOUNTER — Ambulatory Visit (INDEPENDENT_AMBULATORY_CARE_PROVIDER_SITE_OTHER): Payer: BC Managed Care – PPO | Admitting: Internal Medicine

## 2016-07-02 VITALS — BP 190/90 | HR 87 | Temp 97.9°F | Resp 18 | Ht 62.0 in | Wt 151.2 lb

## 2016-07-02 DIAGNOSIS — I2510 Atherosclerotic heart disease of native coronary artery without angina pectoris: Secondary | ICD-10-CM

## 2016-07-02 DIAGNOSIS — I10 Essential (primary) hypertension: Secondary | ICD-10-CM

## 2016-07-02 DIAGNOSIS — E7800 Pure hypercholesterolemia, unspecified: Secondary | ICD-10-CM | POA: Diagnosis not present

## 2016-07-02 DIAGNOSIS — R531 Weakness: Secondary | ICD-10-CM | POA: Diagnosis not present

## 2016-07-02 DIAGNOSIS — R8299 Other abnormal findings in urine: Secondary | ICD-10-CM

## 2016-07-02 DIAGNOSIS — Z658 Other specified problems related to psychosocial circumstances: Secondary | ICD-10-CM

## 2016-07-02 DIAGNOSIS — F439 Reaction to severe stress, unspecified: Secondary | ICD-10-CM

## 2016-07-02 DIAGNOSIS — R8290 Unspecified abnormal findings in urine: Secondary | ICD-10-CM

## 2016-07-02 LAB — COMPREHENSIVE METABOLIC PANEL
ALK PHOS: 68 U/L (ref 39–117)
ALT: 21 U/L (ref 0–35)
AST: 23 U/L (ref 0–37)
Albumin: 4.3 g/dL (ref 3.5–5.2)
BILIRUBIN TOTAL: 0.4 mg/dL (ref 0.2–1.2)
BUN: 14 mg/dL (ref 6–23)
CALCIUM: 10.2 mg/dL (ref 8.4–10.5)
CO2: 27 mEq/L (ref 19–32)
Chloride: 101 mEq/L (ref 96–112)
Creatinine, Ser: 0.9 mg/dL (ref 0.40–1.20)
GFR: 67.57 mL/min (ref >60.00)
Glucose, Bld: 98 mg/dL (ref 70–99)
Potassium: 3.9 mEq/L (ref 3.5–5.1)
Sodium: 137 mEq/L (ref 135–145)
TOTAL PROTEIN: 7.7 g/dL (ref 6.0–8.3)

## 2016-07-02 LAB — CBC WITH DIFFERENTIAL/PLATELET
BASOS ABS: 0.1 10*3/uL (ref 0.0–0.1)
Basophils Relative: 0.7 % (ref 0.0–3.0)
Eosinophils Absolute: 0.1 10*3/uL (ref 0.0–0.7)
Eosinophils Relative: 0.8 % (ref 0.0–5.0)
HEMATOCRIT: 40.6 % (ref 36.0–46.0)
Hemoglobin: 13.7 g/dL (ref 12.0–15.0)
LYMPHS PCT: 11.9 % — AB (ref 12.0–46.0)
Lymphs Abs: 1.4 10*3/uL (ref 0.7–4.0)
MCHC: 33.7 g/dL (ref 30.0–36.0)
MCV: 90.7 fl (ref 78.0–100.0)
MONOS PCT: 7.1 % (ref 3.0–12.0)
Monocytes Absolute: 0.8 10*3/uL (ref 0.1–1.0)
Neutro Abs: 9.3 10*3/uL — ABNORMAL HIGH (ref 1.4–7.7)
Neutrophils Relative %: 79.5 % — ABNORMAL HIGH (ref 43.0–77.0)
Platelets: 431 10*3/uL — ABNORMAL HIGH (ref 150.0–400.0)
RBC: 4.48 Mil/uL (ref 3.87–5.11)
RDW: 14.4 % (ref 11.5–15.5)
WBC: 11.8 10*3/uL — ABNORMAL HIGH (ref 4.0–10.5)

## 2016-07-02 LAB — TSH: TSH: 2.18 u[IU]/mL (ref 0.35–4.50)

## 2016-07-02 MED ORDER — TOBRAMYCIN-DEXAMETHASONE 0.3-0.1 % OP SUSP: 1.0000 [drp] | Freq: Four times a day (QID) | OPHTHALMIC | 0 refills | Status: DC

## 2016-07-02 NOTE — Progress Notes (Signed)
Patient ID: Morgan Moore, female   DOB: 1956/05/07, 60 y.o.   MRN: [id number]   Subjective:    Patient ID: Morgan Moore, female    DOB: 1956/09/08, 60 y.o.   MRN: [id number]  HPI  Patient here for ER follow up.  Was recently admitted with non Q wave MI and is s/p PCI and stent x 2 of the cfx.  Procedure performed at [facility].  Sees cardiology - Dr Clayborn Bigness.  See his 06/16/16 note for details.  Was complaining of feeling weak.  He stopped the coreg and increased the verapamil to 115m.  She has continued to feel weak.  State some days are worse than others.  No energy.  Hard for her to stand up for any length of time.  No headache.  No dizziness.  No chest pain.  Breathing overall stable.  Went to ER 06/28/16.  Was diagnosed with uti.  Placed on cipro.  Stopped today secondary to some notice of lip swelling.  No lip swelling now.  Eating.  No vomiting.  She is accompanied by her sister-n-law.  History obtained from both of them.  No abdominal pain or cramping.  Bowels stable.  In ER, the decreased her verapamil back down to 1232m     Past Medical History:  Diagnosis Date  . Allergy   . Chicken pox   . Heart murmur   . Hyperlipidemia   . Hypertension   . Migraines    hormonal, puberty  . Nephrolithiasis   . Phlebitis    Past Surgical History:  Procedure Laterality Date  . BREAST BIOPSY    . heart murmur    . TONSILLECTOMY AND ADENOIDECTOMY  1962  . tubaligation  1990   Family History  Problem Relation Age of Onset  . Heart disease Father   . Heart disease Brother   . Cancer Maternal Aunt    Social History   Social History  . Marital status: Divorced    Spouse name: N/A  . Number of children: N/A  . Years of education: N/A   Social History Main Topics  . Smoking status: Never Smoker  . Smokeless tobacco: Never Used  . Alcohol use No  . Drug use: Unknown  . Sexual activity: Not Asked   Other Topics Concern  . None   Social History Narrative  . None     Outpatient Encounter Prescriptions as of 07/02/2016  Medication Sig  . ALPRAZolam (XANAX) 0.25 MG tablet Take 1 tablet (0.25 mg total) by mouth at bedtime as needed.  . Marland Kitchenspirin EC 81 MG tablet Take 0.81 mg by mouth daily.  . ciprofloxacin (CIPRO) 250 MG tablet Take 250 mg by mouth 2 (two) times daily.  . Marland KitchenPINEPHrine 0.3 mg/0.3 mL IJ SOAJ injection 0.3 mg as needed.  . fexofenadine (ALLEGRA) 180 MG tablet Take 180 mg by mouth daily.  . Mesalamine (DELZICOL) 400 MG CPDR DR capsule Take 6 capsules (2,400 mg total) by mouth daily.  . ondansetron (ZOFRAN ODT) 4 MG disintegrating tablet Take 1 tablet (4 mg total) by mouth every 8 (eight) hours as needed for nausea or vomiting.  . rizatriptan (MAXALT) 5 MG tablet USE AS DIRECTED  . rosuvastatin (CRESTOR) 5 MG tablet Take 1 tablet (5 mg total) by mouth daily.  . verapamil (CALAN-SR) 120 MG CR tablet Take 1 tablet (120 mg total) by mouth daily.  . Marland Kitchenmeprazole (PRILOSEC) 40 MG capsule Take 40 mg by mouth daily. Reported on 04/13/2016  . tobramycin-dexamethasone (TOBRADEX)  ophthalmic solution Place 1 drop into the right eye every 6 (six) hours.   No facility-administered encounter medications on file as of 07/02/2016.     Review of Systems  Constitutional: Positive for fatigue. Negative for appetite change.  HENT: Negative for congestion and sinus pressure.   Respiratory: Negative for cough, chest tightness and shortness of breath.   Cardiovascular: Negative for chest pain, palpitations and leg swelling.  Gastrointestinal: Negative for abdominal pain, diarrhea, nausea and vomiting.  Genitourinary: Negative for difficulty urinating and dysuria.  Musculoskeletal: Negative for joint swelling and myalgias.  Skin: Negative for color change and rash.  Neurological: Negative for dizziness, light-headedness and headaches.  Psychiatric/Behavioral: Negative for agitation and dysphoric mood.       Objective:     Blood pressure rechecked by me:  Standing  - 154/84 and lying 172/82  Physical Exam  Constitutional: She appears well-developed and well-nourished. No distress.  HENT:  Nose: Nose normal.  Mouth/Throat: Oropharynx is clear and moist.  Neck: Neck supple. No thyromegaly present.  Cardiovascular: Normal rate and regular rhythm.   Pulmonary/Chest: Breath sounds normal. No respiratory distress. She has no wheezes.  Abdominal: Soft. Bowel sounds are normal. There is no tenderness.  Musculoskeletal: She exhibits no edema or tenderness.  Lymphadenopathy:    She has no cervical adenopathy.  Skin: No rash noted. No erythema.  Psychiatric: She has a normal mood and affect. Her behavior is normal.    BP (!) 190/90   Pulse 87   Temp 97.9 F (36.6 C) (Oral)   Resp 18   Ht 5' 2"  (1.575 m)   Wt 151 lb 4 oz (68.6 kg)   SpO2 98%   BMI 27.66 kg/m  Wt Readings from Last 3 Encounters:  07/02/16 151 lb 4 oz (68.6 kg)  04/13/16 156 lb 9.6 oz (71 kg)  01/16/16 157 lb 4 oz (71.3 kg)     Lab Results  Component Value Date   WBC 11.8 (H) 07/02/2016   HGB 13.7 07/02/2016   HCT 40.6 07/02/2016   PLT 431.0 (H) 07/02/2016   GLUCOSE 98 07/02/2016   CHOL 258 (H) 04/09/2016   TRIG 131.0 04/09/2016   HDL 54.20 04/09/2016   LDLCALC 178 (H) 04/09/2016   ALT 21 07/02/2016   AST 23 07/02/2016   NA 137 07/02/2016   K 3.9 07/02/2016   CL 101 07/02/2016   CREATININE 0.90 07/02/2016   BUN 14 07/02/2016   CO2 27 07/02/2016   TSH 2.18 07/02/2016   INR 1.1 09/21/2014    Dg Lumbar Spine 2-3 Views  Result Date: 04/13/2016 CLINICAL DATA:  Fall 12 weeks ago EXAM: LUMBAR SPINE - 2-3 VIEW COMPARISON:  None. FINDINGS: Anatomic alignment. No vertebral compression deformity. Anterior osteophyte formation at L2-3 with mild disc space narrowing. No definite fracture. IMPRESSION: No acute bony pathology. Electronically Signed   By: Marybelle Killings M.D.   On: 04/13/2016 17:12   Dg Hip Unilat With Pelvis 2-3 Views Left  Result Date: 04/13/2016 CLINICAL DATA:   Status post fall 12 weeks ago with persistent back and left hip pain with mild lip EXAM: DG HIP (WITH OR WITHOUT PELVIS) 2-3V LEFT COMPARISON:  Pelvis radiograph of December 21, 2014 FINDINGS: The bony pelvis is subjectively will adequately mineralized. There is no lytic or blastic lesion. No acute or old fracture is observed. The observed portions of the sacrum are normal. The pubic rami are intact. AP and lateral views of the left hip reveal mild symmetric narrowing of  the joint space. A tiny spur arises from the inferior articular margin of the femoral head. The articular surfaces of the femoral head and acetabulum remains smoothly rounded. The femoral neck, intertrochanteric region, and subtrochanteric region are normal. The overlying soft tissues are unremarkable. IMPRESSION: There is mild symmetric narrowing of the left hip joint space consistent with mild osteoarthritis. There is no acute bony abnormality. Osteoarthritic pleuritic change. Electronically Signed   By: David  Martinique M.D.   On: 04/13/2016 17:20       Assessment & Plan:   Problem List Items Addressed This Visit    CAD (coronary artery disease)    Seeing Dr Clayborn Bigness.  Persistent symptoms as outlined.  S/p PCI/stent x 2 cfx.  Continue aggressive risk factor modification.  Discussed current symptoms with Dr Clayborn Bigness.  He is seeing her today.  Follow.       Essential hypertension    Blood pressure elevated.  Improved on recheck.  Refer to cardiology as outlined.  Follow.        Hypercholesterolemia    On crestor 59m q day now.  Question if contributing to current symptoms.  Consider decreasing the dose.        Stress    Increased stress as outlined.  Follow.        Weakness    Persistent.  Unclear etiology.  Off cipro.  Recheck urine to confirm no infection.  Hold on further abx.  Recheck potassium and cbc.  Consider decreasing dose of crestor.  Currently on 437mq day.  This was started after at her recent hospitalization.   Discussed further w/up.  She declines ER evaluation.  Discussed with Dr CaClayborn Bigness He is going to see the patient today.  Pt comfortable with plan.       Relevant Orders   CBC with Differential/Platelet (Completed)   Comprehensive metabolic panel (Completed)   TSH (Completed)    Other Visit Diagnoses    Cloudy urine    -  Primary   Was treated with cipr recently.  off now.  recheck urine to confirm if infection present.  hold on further abx.       I spent 45 minutes with the patient and more than 50% of the time was spent in consultation regarding the above.     SCEinar PheasantMD

## 2016-07-02 NOTE — Progress Notes (Signed)
Pre-visit discussion using our clinic review tool. No additional management support is needed unless otherwise documented below in the visit note.  

## 2016-07-03 ENCOUNTER — Other Ambulatory Visit: Payer: Self-pay | Admitting: Internal Medicine

## 2016-07-03 ENCOUNTER — Encounter: Payer: Self-pay | Admitting: *Deleted

## 2016-07-03 ENCOUNTER — Encounter: Payer: Self-pay | Admitting: Internal Medicine

## 2016-07-03 ENCOUNTER — Telehealth: Payer: Self-pay | Admitting: *Deleted

## 2016-07-03 DIAGNOSIS — R519 Headache, unspecified: Secondary | ICD-10-CM

## 2016-07-03 DIAGNOSIS — R8281 Pyuria: Secondary | ICD-10-CM

## 2016-07-03 DIAGNOSIS — R51 Headache: Principal | ICD-10-CM

## 2016-07-03 NOTE — Assessment & Plan Note (Signed)
Seeing Dr Clayborn Bigness.  Persistent symptoms as outlined.  S/p PCI/stent x 2 cfx.  Continue aggressive risk factor modification.  Discussed current symptoms with Dr Clayborn Bigness.  He is seeing her today.  Follow.

## 2016-07-03 NOTE — Assessment & Plan Note (Signed)
On crestor 67m q day now.  Question if contributing to current symptoms.  Consider decreasing the dose.

## 2016-07-03 NOTE — Assessment & Plan Note (Signed)
Persistent.  Unclear etiology.  Off cipro.  Recheck urine to confirm no infection.  Hold on further abx.  Recheck potassium and cbc.  Consider decreasing dose of crestor.  Currently on 28m q day.  This was started after at her recent hospitalization.  Discussed further w/up.  She declines ER evaluation.  Discussed with Dr CClayborn Bigness  He is going to see the patient today.  Pt comfortable with plan.

## 2016-07-03 NOTE — Assessment & Plan Note (Signed)
Blood pressure elevated.  Improved on recheck.  Refer to cardiology as outlined.  Follow.

## 2016-07-03 NOTE — Addendum Note (Signed)
Addended by: Frutoso Chase A on: 07/03/2016 09:19 AM   Modules accepted: Orders

## 2016-07-03 NOTE — Telephone Encounter (Signed)
Spoke to patient, she stated that due to the CX not being back yet her cardiologist gave her Nitrofuant mono-NCRL 14 Tabs a substitute for Macrobid. Patient is asking if Dr. Nicki Reaper will also put in tick panel for Monday since she is already going to lab.  Patient would also like to speak with Dr. Nicki Reaper directly.

## 2016-07-03 NOTE — Telephone Encounter (Signed)
Patient has requested lab results, she also has questions about medications  Pt contact [phone]

## 2016-07-03 NOTE — Assessment & Plan Note (Signed)
Increased stress as outlined.  Follow.

## 2016-07-05 NOTE — Telephone Encounter (Signed)
Called pt Friday 07/03/16.  Discussed her visit with Dr Clayborn Bigness.  Discussed abx.  Discussed risk of abx.  Discussed taking a probiotic if takes abx.  Is feeling better.  Wants panel ordered for tick exposure.  Labs ordered.  Wants to come in Monday for labs.

## 2016-07-06 ENCOUNTER — Other Ambulatory Visit: Payer: Self-pay | Admitting: Internal Medicine

## 2016-07-06 ENCOUNTER — Encounter: Payer: BC Managed Care – PPO | Attending: Internal Medicine | Admitting: *Deleted

## 2016-07-06 ENCOUNTER — Other Ambulatory Visit: Payer: BC Managed Care – PPO

## 2016-07-06 ENCOUNTER — Other Ambulatory Visit (INDEPENDENT_AMBULATORY_CARE_PROVIDER_SITE_OTHER): Payer: BC Managed Care – PPO

## 2016-07-06 VITALS — Ht 63.0 in | Wt 152.5 lb

## 2016-07-06 DIAGNOSIS — R519 Headache, unspecified: Secondary | ICD-10-CM

## 2016-07-06 DIAGNOSIS — Z955 Presence of coronary angioplasty implant and graft: Secondary | ICD-10-CM

## 2016-07-06 DIAGNOSIS — Z9861 Coronary angioplasty status: Secondary | ICD-10-CM | POA: Insufficient documentation

## 2016-07-06 DIAGNOSIS — I213 ST elevation (STEMI) myocardial infarction of unspecified site: Secondary | ICD-10-CM | POA: Insufficient documentation

## 2016-07-06 DIAGNOSIS — R51 Headache: Secondary | ICD-10-CM

## 2016-07-06 DIAGNOSIS — I214 Non-ST elevation (NSTEMI) myocardial infarction: Secondary | ICD-10-CM

## 2016-07-06 LAB — URINE CULTURE: Colony Count: >=100000

## 2016-07-06 NOTE — Patient Instructions (Signed)
Patient Instructions  Patient Details  Name: Morgan Moore MRN: [id number] Date of Birth: 12/08/1955 Referring Provider:  Yolonda Kida, MD  Below are the personal goals you chose as well as exercise and nutrition goals. Our goal is to help you keep on track towards obtaining and maintaining your goals. We will be discussing your progress on these goals with you throughout the program.  Initial Exercise Prescription:     Initial Exercise Prescription - 07/06/16 1400      Date of Initial Exercise RX and Referring Provider   Date 07/06/16   Referring Provider Lujean Amel MD     Treadmill   MPH 1.8   Grade 0.5   Minutes 15   METs 2.5     NuStep   Level 1   Minutes 15   METs 2     Biostep-RELP   Level 1   Minutes 15   METs 2     Prescription Details   Frequency (times per week) 3   Duration Progress to 45 minutes of aerobic exercise without signs/symptoms of physical distress     Intensity   THRR 40-80% of Max Heartrate 110-143   Ratings of Perceived Exertion 11-15   Perceived Dyspnea 0-4     Progression   Progression Continue to progress workloads to maintain intensity without signs/symptoms of physical distress.     Resistance Training   Training Prescription Yes   Weight 2 lbs   Reps 10-15      Exercise Goals: Frequency: Be able to perform aerobic exercise three times per week working toward 3-5 days per week.  Intensity: Work with a perceived exertion of 11 (fairly light) - 15 (hard) as tolerated. Follow your new exercise prescription and watch for changes in prescription as you progress with the program. Changes will be reviewed with you when they are made.  Duration: You should be able to do 30 minutes of continuous aerobic exercise in addition to a 5 minute warm-up and a 5 minute cool-down routine.  Nutrition Goals: Your personal nutrition goals will be established when you do your nutrition analysis with the dietician.  The following  are nutrition guidelines to follow: Cholesterol < 272m/day Sodium < 150105mday Fiber: Women over 50 yrs - 21 grams per day  Personal Goals:     Personal Goals and Risk Factors at Admission - 07/06/16 1449      Core Components/Risk Factors/Patient Goals on Admission   Sedentary Yes   Intervention Provide advice, education, support and counseling about physical activity/exercise needs.;Develop an individualized exercise prescription for aerobic and resistive training based on initial evaluation findings, risk stratification, comorbidities and participant's personal goals.   Expected Outcomes Achievement of increased cardiorespiratory fitness and enhanced flexibility, muscular endurance and strength shown through measurements of functional capacity and personal statement of participant.   Increase Strength and Stamina Yes   Intervention Provide advice, education, support and counseling about physical activity/exercise needs.;Develop an individualized exercise prescription for aerobic and resistive training based on initial evaluation findings, risk stratification, comorbidities and participant's personal goals.   Expected Outcomes Achievement of increased cardiorespiratory fitness and enhanced flexibility, muscular endurance and strength shown through measurements of functional capacity and personal statement of participant.   Hypertension Yes   Intervention Provide education on lifestyle modifcations including regular physical activity/exercise, weight management, moderate sodium restriction and increased consumption of fresh fruit, vegetables, and low fat dairy, alcohol moderation, and smoking cessation.;Monitor prescription use compliance.   Expected Outcomes Short Term: Continued assessment  and intervention until BP is < 140/60m HG in hypertensive participants. < 130/866mHG in hypertensive participants with diabetes, heart failure or chronic kidney disease.;Long Term: Maintenance of blood  pressure at goal levels.   Lipids Yes   Intervention Provide education and support for participant on nutrition & aerobic/resistive exercise along with prescribed medications to achieve LDL <7047mHDL >61m25m Expected Outcomes Short Term: Participant states understanding of desired cholesterol values and is compliant with medications prescribed. Participant is following exercise prescription and nutrition guidelines.;Long Term: Cholesterol controlled with medications as prescribed, with individualized exercise RX and with personalized nutrition plan. Value goals: LDL < 70mg9mL > 40 mg.      Tobacco Use Initial Evaluation: History  Smoking Status  . Never Smoker  Smokeless Tobacco  . Never Used    Copy of goals given to participant.

## 2016-07-06 NOTE — Progress Notes (Signed)
Cardiac Individual Treatment Plan  Patient Details  Name: Morgan Moore MRN: [id number] Date of Birth: [birth year] Referring Provider:   Flowsheet Row Cardiac Rehab from 07/06/2016 in [facility] Cardiac and Pulmonary Rehab  Referring Provider  Lujean Amel MD      Initial Encounter Date:  Flowsheet Row Cardiac Rehab from 07/06/2016 in [facility] Cardiac and Pulmonary Rehab  Date  07/06/16  Referring Provider  Lujean Amel MD      Visit Diagnosis: NSTEMI (non-ST elevated myocardial infarction) [facility])  Status post coronary artery stent placement  Patient's Home Medications on Admission:  Current Outpatient Prescriptions:  .  ALPRAZolam (XANAX) 0.25 MG tablet, Take 1 tablet (0.25 mg total) by mouth at bedtime as needed., Disp: 30 tablet, Rfl: 0 .  aspirin EC 81 MG tablet, Take 0.81 mg by mouth daily., Disp: , Rfl:  .  clopidogrel (PLAVIX) 75 MG tablet, Take 75 mg by mouth daily., Disp: , Rfl:  .  EPINEPHrine 0.3 mg/0.3 mL IJ SOAJ injection, 0.3 mg as needed., Disp: , Rfl:  .  fexofenadine (ALLEGRA) 180 MG tablet, Take 180 mg by mouth daily., Disp: , Rfl:  .  Mesalamine (DELZICOL) 400 MG CPDR DR capsule, Take 6 capsules (2,400 mg total) by mouth daily., Disp: 180 capsule, Rfl: 2 .  nitrofurantoin, macrocrystal-monohydrate, (MACROBID) 100 MG capsule, Take by mouth., Disp: , Rfl:  .  ondansetron (ZOFRAN ODT) 4 MG disintegrating tablet, Take 1 tablet (4 mg total) by mouth every 8 (eight) hours as needed for nausea or vomiting., Disp: 20 tablet, Rfl: 0 .  rizatriptan (MAXALT) 5 MG tablet, USE AS DIRECTED, Disp: 10 tablet, Rfl: 0 .  tobramycin-dexamethasone (TOBRADEX) ophthalmic solution, Place 1 drop into the right eye every 6 (six) hours., Disp: 5 mL, Rfl: 0 .  verapamil (CALAN-SR) 120 MG CR tablet, Take 1 tablet (120 mg total) by mouth daily., Disp: 30 tablet, Rfl: 5 .  ciprofloxacin (CIPRO) 250 MG tablet, Take 250 mg by mouth 2 (two) times daily., Disp: , Rfl:  .  omeprazole (PRILOSEC)  40 MG capsule, Take 40 mg by mouth daily. Reported on 04/13/2016, Disp: , Rfl:  .  rosuvastatin (CRESTOR) 5 MG tablet, Take 1 tablet (5 mg total) by mouth daily. (Patient not taking: Reported on 07/06/2016), Disp: 30 tablet, Rfl: 1  Past Medical History: Past Medical History:  Diagnosis Date  . Allergy   . Chicken pox   . Heart murmur   . Hyperlipidemia   . Hypertension   . Migraines    hormonal, puberty  . Nephrolithiasis   . Phlebitis     Tobacco Use: History  Smoking Status  . Never Smoker  Smokeless Tobacco  . Never Used    Labs: Recent Review Flowsheet Data    Labs for ITP Cardiac and Pulmonary Rehab Latest Ref Rng & Units 09/24/2014 02/01/2015 11/07/2015 04/09/2016   Cholestrol 0 - 200 mg/dL 191 249(A) 254(H) 258(H)   LDLCALC 0 - 99 mg/dL 107(H) 168 169(H) 178(H)   HDL >39.00 mg/dL 50 49 56.50 54.20   Trlycerides 0.0 - 149.0 mg/dL 172 198(A) 142.0 131.0       Exercise Target Goals: Date: 07/06/16  Exercise Program Goal: Individual exercise prescription set with THRR, safety & activity barriers. Participant demonstrates ability to understand and report RPE using BORG scale, to self-measure pulse accurately, and to acknowledge the importance of the exercise prescription.  Exercise Prescription Goal: Starting with aerobic activity 30 plus minutes a day, 3 days per week for initial exercise prescription.  Provide home exercise prescription and guidelines that participant acknowledges understanding prior to discharge.  Activity Barriers & Risk Stratification:     Activity Barriers & Cardiac Risk Stratification - 07/06/16 1409      Activity Barriers & Cardiac Risk Stratification   Activity Barriers History of Falls;Balance Concerns;Deconditioning;Muscular Weakness;Other (comment)   Comments occasional dizzy spells, r hip bone spurs      6 Minute Walk:     6 Minute Walk    Row Name 07/06/16 1404         6 Minute Walk   Phase Initial     Distance 1100 feet      Walk Time 6 minutes     # of Rest Breaks 0     MPH 2.08     METS 3.14     RPE 11     VO2 Peak 11     Symptoms No     Resting HR 78 bpm     Resting BP 128/64     Max Ex. HR 96 bpm     Max Ex. BP 158/70     2 Minute Post BP 126/64        Initial Exercise Prescription:     Initial Exercise Prescription - 07/06/16 1400      Date of Initial Exercise RX and Referring Provider   Date 07/06/16   Referring Provider Lujean Amel MD     Treadmill   MPH 1.8   Grade 0.5   Minutes 15   METs 2.5     NuStep   Level 1   Minutes 15   METs 2     Biostep-RELP   Level 1   Minutes 15   METs 2     Prescription Details   Frequency (times per week) 3   Duration Progress to 45 minutes of aerobic exercise without signs/symptoms of physical distress     Intensity   THRR 40-80% of Max Heartrate 110-143   Ratings of Perceived Exertion 11-15   Perceived Dyspnea 0-4     Progression   Progression Continue to progress workloads to maintain intensity without signs/symptoms of physical distress.     Resistance Training   Training Prescription Yes   Weight 2 lbs   Reps 10-15      Perform Capillary Blood Glucose checks as needed.  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          Discharge Exercise Prescription (Final Exercise Prescription Changes):     Exercise Prescription Changes - 07/06/16 1300      Response to Exercise   Blood Pressure  (Admit) 128/84   Blood  Pressure (Exercise) 158/70   Blood Pressure (Exit) 126/64   Heart Rate (Admit) 83 bpm   Heart Rate (Exercise) 96 bpm   Heart Rate (Exit) 78 bpm   Rating of Perceived Exertion (Exercise) 11      Nutrition:  Target Goals: Understanding of nutrition guidelines, daily intake of sodium <1591m, cholesterol <2059m calories 30% from fat and 7% or less from saturated fats, daily to have 5 or more servings of fruits and vegetables.  Biometrics:     Pre Biometrics - 07/06/16 1418      Pre Biometrics   Height 5' 3"  (1.6 m)   Weight 152 lb 8 oz (69.2 kg)   Waist Circumference 33 inches   Hip Circumference 40.5 inches   Waist to Hip Ratio 0.81 %   BMI (Calculated) 27.1   Single Leg Stand 0 seconds       Nutrition Therapy Plan and Nutrition Goals:     Nutrition Therapy & Goals - 07/06/16 1442      Intervention Plan   Intervention Prescribe, educate and counsel regarding individualized specific dietary modifications aiming towards targeted core components such as weight, hypertension, lipid management, diabetes, heart failure and other comorbidities.   Expected Outcomes Short Term Goal: Understand basic principles of dietary content, such as calories, fat, sodium, cholesterol and nutrients.;Short Term Goal: A plan has been developed with personal nutrition goals set during dietitian appointment.;Long Term Goal: Adherence to prescribed nutrition plan.      Nutrition Discharge: Rate Your Plate Scores:     Nutrition Assessments - 07/06/16 1443      Rate Your Plate Scores   Pre Score 57   Pre Score % 63 %      Nutrition Goals Re-Evaluation:   Psychosocial: Target Goals: Acknowledge presence or absence of depression, maximize coping skills, provide positive support system. Participant is able to verbalize types and ability to use techniques and skills needed for reducing stress and depression.  Initial Review & Psychosocial Screening:     Initial Psych  Review & Screening - 07/06/16 1453      Family Dynamics   Concerns --  Attended couseling after daughter's death      Quality of Life Scores:     Quality of Life - 07/06/16 1438      Quality of Life Scores   Health/Function Pre 20.92 %   Socioeconomic Pre 27.92 %   Psych/Spiritual Pre 23.33 %   Family Pre 29.38 %   GLOBAL Pre 24.03 %      PHQ-9: Recent Review Flowsheet Data    [facility]   Decreased Interest 3   Down, Depressed, Hopeless 0   PHQ - 2 Score 3   Altered sleeping 0   Tired, decreased energy 3   Change in appetite 0   Feeling bad or failure about yourself  0   Trouble concentrating 0   Moving slowly or fidgety/restless 0   Suicidal thoughts 0   PHQ-9 Score 6   Difficult doing work/chores Very difficult      Psychosocial Evaluation and Intervention:   Psychosocial Re-Evaluation:   Vocational Rehabilitation: Provide vocational rehab assistance to qualifying candidates.   Vocational Rehab Evaluation & Intervention:     Vocational Rehab - 07/06/16 1453      Initial Vocational Rehab Evaluation & Intervention   Assessment shows need for Vocational Rehabilitation No      Education: Education Goals: Education classes will be provided on a weekly basis, covering required topics.  Participant will state understanding/return demonstration of topics presented.  Learning Barriers/Preferences:     Learning Barriers/Preferences - 07/06/16 1452      Learning Barriers/Preferences   Learning Barriers None   Learning Preferences None      Education Topics: General Nutrition Guidelines/Fats and Fiber: -Group instruction provided by verbal, written material, models and posters to present the general guidelines for heart healthy nutrition. Gives an explanation and review of dietary fats and fiber.   Controlling Sodium/Reading Food Labels: -Group verbal and written material supporting the discussion of sodium use in heart  healthy nutrition. Review and explanation with models, verbal and written materials for utilization of the food label.   Exercise Physiology & Risk Factors: - Group verbal and written instruction with models to review the exercise physiology of the cardiovascular system and associated critical values. Details cardiovascular disease risk factors and the goals associated with each risk factor.   Aerobic Exercise & Resistance Training: - Gives group verbal and written discussion on the health impact of inactivity. On the components of aerobic and resistive training programs and the benefits of this training and how to safely progress through these programs.   Flexibility, Balance, General Exercise Guidelines: - Provides group verbal and written instruction on the benefits of flexibility and balance training programs. Provides general exercise guidelines with specific guidelines to those with heart or lung disease. Demonstration and skill practice provided.   Stress Management: - Provides group verbal and written instruction about the health risks of elevated stress, cause of high stress, and healthy ways to reduce stress.   Depression: - Provides group verbal and written instruction on the correlation between heart/lung disease and depressed mood, treatment options, and the stigmas associated with seeking treatment.   Anatomy & Physiology of the Heart: - Group verbal and written instruction and models provide basic cardiac anatomy and physiology, with the coronary electrical and arterial systems. Review of: AMI, Angina, Valve disease, Heart Failure, Cardiac Arrhythmia, Pacemakers, and the ICD.   Cardiac Procedures: - Group verbal and written instruction and models to describe the testing methods done to diagnose heart disease. Reviews the outcomes of the test results. Describes the treatment choices: Medical Management, Angioplasty, or Coronary Bypass Surgery.   Cardiac Medications: -  Group verbal and written instruction to review commonly prescribed medications for heart disease. Reviews the medication, class of the drug, and side effects. Includes the steps to properly store meds and maintain the prescription regimen.   Go Sex-Intimacy & Heart Disease, Get SMART - Goal Setting: - Group verbal and written instruction through game format to discuss heart disease and the return to sexual intimacy. Provides group verbal and written material to discuss and apply goal setting through the application of the S.M.A.R.T. Method.   Other Matters of the Heart: - Provides group verbal, written materials and models to describe Heart Failure, Angina, Valve Disease, and Diabetes in the realm of heart disease. Includes description of the disease process and treatment options available to the cardiac patient.   Exercise & Equipment Safety: - Individual verbal instruction and demonstration of equipment use and safety with use of the equipment. Flowsheet Row Cardiac Rehab from 07/06/2016 in [facility] Cardiac and Pulmonary Rehab  Date  07/06/16  Educator   SB  Instruction Review Code  2- meets goals/outcomes      Infection Prevention: - Provides verbal and written material to individual with discussion of infection control including proper hand washing and proper equipment cleaning during exercise session. Flowsheet Row Cardiac  Rehab from 07/06/2016 in [facility] Cardiac and Pulmonary Rehab  Date  07/06/16  Educator  SB  Instruction Review Code  2- meets goals/outcomes      Falls Prevention: - Provides verbal and written material to individual with discussion of falls prevention and safety. Flowsheet Row Cardiac Rehab from 07/06/2016 in [facility] Cardiac and Pulmonary Rehab  Date  07/06/16  Educator  SB  Instruction Review Code  2- meets goals/outcomes      Diabetes: - Individual verbal and written instruction to review signs/symptoms of diabetes, desired ranges of glucose level fasting, after  meals and with exercise. Advice that pre and post exercise glucose checks will be done for 3 sessions at entry of program.    Knowledge Questionnaire Score:     Knowledge Questionnaire Score - 07/06/16 1452      Knowledge Questionnaire Score   Pre Score 23/28      Core Components/Risk Factors/Patient Goals at Admission:     Personal Goals and Risk Factors at Admission - 07/06/16 1449      Core Components/Risk Factors/Patient Goals on Admission   Sedentary Yes   Intervention Provide advice, education, support and counseling about physical activity/exercise needs.;Develop an individualized exercise prescription for aerobic and resistive training based on initial evaluation findings, risk stratification, comorbidities and participant's personal goals.   Expected Outcomes Achievement of increased cardiorespiratory fitness and enhanced flexibility, muscular endurance and strength shown through measurements of functional capacity and personal statement of participant.   Increase Strength and Stamina Yes   Intervention Provide advice, education, support and counseling about physical activity/exercise needs.;Develop an individualized exercise prescription for aerobic and resistive training based on initial evaluation findings, risk stratification, comorbidities and participant's personal goals.   Expected Outcomes Achievement of increased cardiorespiratory fitness and enhanced flexibility, muscular endurance and strength shown through measurements of functional capacity and personal statement of participant.   Hypertension Yes   Intervention Provide education on lifestyle modifcations including regular physical activity/exercise, weight management, moderate sodium restriction and increased consumption of fresh fruit, vegetables, and low fat dairy, alcohol moderation, and smoking cessation.;Monitor prescription use compliance.   Expected Outcomes Short Term: Continued assessment and intervention  until BP is < 140/32m HG in hypertensive participants. < 130/823mHG in hypertensive participants with diabetes, heart failure or chronic kidney disease.;Long Term: Maintenance of blood pressure at goal levels.   Lipids Yes   Intervention Provide education and support for participant on nutrition & aerobic/resistive exercise along with prescribed medications to achieve LDL <7032mHDL >52m12m Expected Outcomes Short Term: Participant states understanding of desired cholesterol values and is compliant with medications prescribed. Participant is following exercise prescription and nutrition guidelines.;Long Term: Cholesterol controlled with medications as prescribed, with individualized exercise RX and with personalized nutrition plan. Value goals: LDL < 70mg57mL > 40 mg.      Core Components/Risk Factors/Patient Goals Review:    Core Components/Risk Factors/Patient Goals at Discharge (Final Review):    ITP Comments:     ITP Comments    Row Name 07/06/16 1438           ITP Comments Initial ITP created during [employer] Review. Diagnosis documentation can be found CARE EVERYWHERE [facility] encounter 06/16/2016          Comments:

## 2016-07-08 LAB — ROCKY MTN SPOTTED FVR ABS PNL(IGG+IGM)
RMSF IGM: NOT DETECTED
RMSF IgG: NOT DETECTED

## 2016-07-09 LAB — LYME ABY, WSTRN BLT IGG & IGM W/BANDS
B burgdorferi IgG Abs (IB): NEGATIVE
B burgdorferi IgM Abs (IB): NEGATIVE
LYME DISEASE 23 KD IGG: NONREACTIVE
LYME DISEASE 28 KD IGG: NONREACTIVE
LYME DISEASE 30 KD IGG: NONREACTIVE
LYME DISEASE 39 KD IGM: NONREACTIVE
LYME DISEASE 41 KD IGM: NONREACTIVE
LYME DISEASE 45 KD IGG: NONREACTIVE
LYME DISEASE 66 KD IGG: NONREACTIVE
LYME DISEASE 93 KD IGG: NONREACTIVE
Lyme Disease 18 kD IgG: NONREACTIVE
Lyme Disease 23 kD IgM: NONREACTIVE
Lyme Disease 39 kD IgG: NONREACTIVE
Lyme Disease 41 kD IgG: NONREACTIVE
Lyme Disease 58 kD IgG: NONREACTIVE

## 2016-07-14 ENCOUNTER — Other Ambulatory Visit (INDEPENDENT_AMBULATORY_CARE_PROVIDER_SITE_OTHER): Payer: BC Managed Care – PPO

## 2016-07-14 DIAGNOSIS — R8281 Pyuria: Secondary | ICD-10-CM

## 2016-07-14 DIAGNOSIS — N390 Urinary tract infection, site not specified: Secondary | ICD-10-CM | POA: Diagnosis not present

## 2016-07-14 LAB — URINALYSIS, ROUTINE W REFLEX MICROSCOPIC
Bilirubin Urine: NEGATIVE
Hgb urine dipstick: NEGATIVE
KETONES UR: NEGATIVE
NITRITE: NEGATIVE
PH: 6 (ref 5.0–8.0)
SPECIFIC GRAVITY, URINE: 1.015 (ref 1.000–1.030)
Total Protein, Urine: NEGATIVE
URINE GLUCOSE: NEGATIVE
Urobilinogen, UA: 0.2 (ref 0.0–1.0)

## 2016-07-15 ENCOUNTER — Encounter: Payer: BC Managed Care – PPO | Attending: Internal Medicine | Admitting: *Deleted

## 2016-07-15 DIAGNOSIS — I214 Non-ST elevation (NSTEMI) myocardial infarction: Secondary | ICD-10-CM

## 2016-07-15 DIAGNOSIS — Z9861 Coronary angioplasty status: Secondary | ICD-10-CM | POA: Insufficient documentation

## 2016-07-15 DIAGNOSIS — Z955 Presence of coronary angioplasty implant and graft: Secondary | ICD-10-CM

## 2016-07-15 DIAGNOSIS — I213 ST elevation (STEMI) myocardial infarction of unspecified site: Secondary | ICD-10-CM | POA: Diagnosis present

## 2016-07-15 NOTE — Progress Notes (Signed)
Daily Session Note  Patient Details  Name: Vertie Dibbern MRN: [id number] Date of Birth: 1956/06/08 Referring Provider:   Flowsheet Row Cardiac Rehab from 07/06/2016 in [facility] Cardiac and Pulmonary Rehab  Referring Provider  Lujean Amel MD      Encounter Date: 07/15/2016  Check In:     Session Check In - 07/15/16 1746      Check-In   Staff Present Heath Lark, RN, BSN, CCRP;Carroll Enterkin, RN, Vickki Hearing, BA, ACSM CEP, Exercise Physiologist   Supervising physician immediately available to respond to emergencies See telemetry face sheet for immediately available ER MD   Medication changes reported     No   Fall or balance concerns reported    No   Warm-up and Cool-down Performed on first and last piece of equipment   Resistance Training Performed Yes   VAD Patient? No     Pain Assessment   Currently in Pain? No/denies         Goals Met:  Exercise tolerated well Personal goals reviewed No report of cardiac concerns or symptoms Strength training completed today  Goals Unmet:  Not Applicable  Comments: First full session.    Dr. Emily Filbert is Medical Director for [facility].

## 2016-07-16 ENCOUNTER — Encounter (INDEPENDENT_AMBULATORY_CARE_PROVIDER_SITE_OTHER): Payer: Self-pay

## 2016-07-16 ENCOUNTER — Ambulatory Visit (INDEPENDENT_AMBULATORY_CARE_PROVIDER_SITE_OTHER): Payer: BC Managed Care – PPO | Admitting: Internal Medicine

## 2016-07-16 ENCOUNTER — Encounter: Payer: Self-pay | Admitting: Internal Medicine

## 2016-07-16 ENCOUNTER — Telehealth: Payer: Self-pay | Admitting: *Deleted

## 2016-07-16 VITALS — BP 134/72 | HR 74 | Temp 98.2°F | Ht 63.0 in | Wt 154.4 lb

## 2016-07-16 DIAGNOSIS — E7800 Pure hypercholesterolemia, unspecified: Secondary | ICD-10-CM | POA: Diagnosis not present

## 2016-07-16 DIAGNOSIS — N3000 Acute cystitis without hematuria: Secondary | ICD-10-CM

## 2016-07-16 DIAGNOSIS — Z658 Other specified problems related to psychosocial circumstances: Secondary | ICD-10-CM | POA: Diagnosis not present

## 2016-07-16 DIAGNOSIS — F439 Reaction to severe stress, unspecified: Secondary | ICD-10-CM

## 2016-07-16 DIAGNOSIS — R531 Weakness: Secondary | ICD-10-CM | POA: Diagnosis not present

## 2016-07-16 DIAGNOSIS — I10 Essential (primary) hypertension: Secondary | ICD-10-CM

## 2016-07-16 DIAGNOSIS — I2510 Atherosclerotic heart disease of native coronary artery without angina pectoris: Secondary | ICD-10-CM

## 2016-07-16 LAB — CULTURE, URINE COMPREHENSIVE

## 2016-07-16 MED ORDER — NITROFURANTOIN MONOHYD MACRO 100 MG PO CAPS: 100.0000 mg | ORAL_CAPSULE | Freq: Two times a day (BID) | ORAL | 0 refills | Status: DC

## 2016-07-16 NOTE — Progress Notes (Signed)
Patient ID: Morgan Moore, female   DOB: [birth year], 60 y.o.   MRN: [id number]   Subjective:    Patient ID: Morgan Moore, female    DOB: Nov 02, 1956, 60 y.o.   MRN: [id number]  HPI  Patient here for a scheduled follow up.  Was recently admitted with non Q wave MI and is s/p PCI and stent x 2 of the cfx.  Sees Dr Clayborn Bigness.  Has been having problems with increased weakness.  See last note.  crestor was put on hold.  Treated UTI.  She is going to cardiac rehab.  Her symptoms have improved.  She is doing better and feels better.  Still has decreased energy and gives out with increased exertion, but overall is improving.  She is off crestor.   We discussed starting a lower dose.  No chest pain.  Eating and drinking.  At rehab, she aggravated her back.  Has back issues.  Feels similar.  Is better today.  Noticed some nocturia and wanted urine checked to make sure infection cleared.  Not working currently.     Past Medical History:  Diagnosis Date  . Allergy   . Chicken pox   . Heart murmur   . Hyperlipidemia   . Hypertension   . Migraines    hormonal, puberty  . Nephrolithiasis   . Phlebitis    Past Surgical History:  Procedure Laterality Date  . BREAST BIOPSY    . heart murmur    . TONSILLECTOMY AND ADENOIDECTOMY  1962  . tubaligation  1990   Family History  Problem Relation Age of Onset  . Heart disease Father   . Heart disease Brother   . Cancer Maternal Aunt    Social History   Social History  . Marital status: Divorced    Spouse name: N/A  . Number of children: N/A  . Years of education: N/A   Social History Main Topics  . Smoking status: Never Smoker  . Smokeless tobacco: Never Used  . Alcohol use No  . Drug use: Unknown  . Sexual activity: Not Asked   Other Topics Concern  . None   Social History Narrative  . None    Outpatient Encounter Prescriptions as of 07/16/2016  Medication Sig  . ALPRAZolam (XANAX) 0.25 MG tablet Take 1 tablet (0.25 mg  total) by mouth at bedtime as needed.  Marland Kitchen aspirin EC 81 MG tablet Take 0.81 mg by mouth daily.  . clopidogrel (PLAVIX) 75 MG tablet Take 75 mg by mouth daily.  Marland Kitchen EPINEPHrine 0.3 mg/0.3 mL IJ SOAJ injection 0.3 mg as needed.  . fexofenadine (ALLEGRA) 180 MG tablet Take 180 mg by mouth daily.  . Mesalamine (DELZICOL) 400 MG CPDR DR capsule Take 6 capsules (2,400 mg total) by mouth daily.  . ondansetron (ZOFRAN ODT) 4 MG disintegrating tablet Take 1 tablet (4 mg total) by mouth every 8 (eight) hours as needed for nausea or vomiting.  . rizatriptan (MAXALT) 5 MG tablet USE AS DIRECTED  . rosuvastatin (CRESTOR) 5 MG tablet Take 1 tablet (5 mg total) by mouth daily.  Marland Kitchen tobramycin-dexamethasone (TOBRADEX) ophthalmic solution Place 1 drop into the right eye every 6 (six) hours.  . verapamil (CALAN-SR) 120 MG CR tablet Take 1 tablet (120 mg total) by mouth daily.  . nitrofurantoin, macrocrystal-monohydrate, (MACROBID) 100 MG capsule Take 1 capsule (100 mg total) by mouth 2 (two) times daily.  Marland Kitchen omeprazole (PRILOSEC) 40 MG capsule Take 40 mg by mouth daily. Reported on 04/13/2016  . [  DISCONTINUED] ciprofloxacin (CIPRO) 250 MG tablet Take 250 mg by mouth 2 (two) times daily.   No facility-administered encounter medications on file as of 07/16/2016.     Review of Systems  Constitutional: Positive for fatigue. Negative for appetite change.  HENT: Negative for congestion and sinus pressure.   Respiratory: Negative for cough and chest tightness.        Still with some sob with exertion.  Is better.    Cardiovascular: Negative for chest pain, palpitations and leg swelling.  Gastrointestinal: Negative for abdominal pain, diarrhea, nausea and vomiting.  Genitourinary: Negative for dysuria.       Nocturia.   Musculoskeletal: Positive for back pain. Negative for joint swelling.  Skin: Negative for color change and rash.  Neurological: Negative for dizziness and headaches.  Psychiatric/Behavioral: Negative for  agitation.       Increased stress as outlined.         Objective:     Blood pressure rechecked by me prior to leaving:  148/80  Physical Exam  Constitutional: She appears well-developed and well-nourished. No distress.  HENT:  Nose: Nose normal.  Mouth/Throat: Oropharynx is clear and moist.  Neck: Neck supple. No thyromegaly present.  Cardiovascular: Normal rate and regular rhythm.   Pulmonary/Chest: Breath sounds normal. No respiratory distress. She has no wheezes.  Abdominal: Soft. Bowel sounds are normal. There is no tenderness.  Musculoskeletal: She exhibits no edema or tenderness.  Lymphadenopathy:    She has no cervical adenopathy.  Skin: No rash noted. No erythema.  Psychiatric: She has a normal mood and affect. Her behavior is normal.    BP 134/72   Pulse 74   Temp 98.2 F (36.8 C) (Oral)   Ht 5' 3"  (1.6 m)   Wt 154 lb 6.4 oz (70 kg)   SpO2 98%   BMI 27.35 kg/m  Wt Readings from Last 3 Encounters:  07/16/16 154 lb 6.4 oz (70 kg)  07/06/16 152 lb 8 oz (69.2 kg)  07/02/16 151 lb 4 oz (68.6 kg)     Lab Results  Component Value Date   WBC 11.8 (H) 07/02/2016   HGB 13.7 07/02/2016   HCT 40.6 07/02/2016   PLT 431.0 (H) 07/02/2016   GLUCOSE 98 07/02/2016   CHOL 258 (H) 04/09/2016   TRIG 131.0 04/09/2016   HDL 54.20 04/09/2016   LDLCALC 178 (H) 04/09/2016   ALT 21 07/02/2016   AST 23 07/02/2016   NA 137 07/02/2016   K 3.9 07/02/2016   CL 101 07/02/2016   CREATININE 0.90 07/02/2016   BUN 14 07/02/2016   CO2 27 07/02/2016   TSH 2.18 07/02/2016   INR 1.1 09/21/2014    Dg Lumbar Spine 2-3 Views  Result Date: 04/13/2016 CLINICAL DATA:  Fall 12 weeks ago EXAM: LUMBAR SPINE - 2-3 VIEW COMPARISON:  None. FINDINGS: Anatomic alignment. No vertebral compression deformity. Anterior osteophyte formation at L2-3 with mild disc space narrowing. No definite fracture. IMPRESSION: No acute bony pathology. Electronically Signed   By: Marybelle Killings M.D.   On: 04/13/2016  17:12   Dg Hip Unilat With Pelvis 2-3 Views Left  Result Date: 04/13/2016 CLINICAL DATA:  Status post fall 12 weeks ago with persistent back and left hip pain with mild lip EXAM: DG HIP (WITH OR WITHOUT PELVIS) 2-3V LEFT COMPARISON:  Pelvis radiograph of December 21, 2014 FINDINGS: The bony pelvis is subjectively will adequately mineralized. There is no lytic or blastic lesion. No acute or old fracture is observed. The observed  portions of the sacrum are normal. The pubic rami are intact. AP and lateral views of the left hip reveal mild symmetric narrowing of the joint space. A tiny spur arises from the inferior articular margin of the femoral head. The articular surfaces of the femoral head and acetabulum remains smoothly rounded. The femoral neck, intertrochanteric region, and subtrochanteric region are normal. The overlying soft tissues are unremarkable. IMPRESSION: There is mild symmetric narrowing of the left hip joint space consistent with mild osteoarthritis. There is no acute bony abnormality. Osteoarthritic pleuritic change. Electronically Signed   By: David  Martinique M.D.   On: 04/13/2016 17:20       Assessment & Plan:   Problem List Items Addressed This Visit    CAD (coronary artery disease)    Seeing Dr Clayborn Bigness.  Has appt tomorrow.  Going to cardiac rehab.  Follow.       Essential hypertension    Blood pressure elevated on my check.  Improved on check prior to leaving.  Hold on making changes in medication.  Follow.        Hypercholesterolemia    Off cholesterol medication for now.  Discussed restarting a lower dose.  Feels better.  She wants to discuss with Dr Clayborn Bigness.  appt 07/17/16.        Stress    Increased stress.  Discussed with her today.  Overall relatively stable.  Follow.       Weakness    Has improved.  She is going to cardiac rehab now.  Off crestor.  Discussed restarting a lower dose.  Treat UTI.  Follow.        Other Visit Diagnoses    Acute cystitis without  hematuria    -  Primary   Recently treated with macrobid.  better.  persistent infection.  discussed with her.  since nocturia, will extend abx.  follow.         Einar Pheasant, MD

## 2016-07-16 NOTE — Progress Notes (Signed)
Pre visit review using our clinic review tool, if applicable. No additional management support is needed unless otherwise documented below in the visit note. 

## 2016-07-16 NOTE — Telephone Encounter (Signed)
Morgan Moore called and said she is sorry that she can't attend Cardiac Rehab today due to a doctors appt.

## 2016-07-17 ENCOUNTER — Encounter: Payer: Self-pay | Admitting: Internal Medicine

## 2016-07-17 NOTE — Assessment & Plan Note (Signed)
Blood pressure elevated on my check.  Improved on check prior to leaving.  Hold on making changes in medication.  Follow.

## 2016-07-17 NOTE — Assessment & Plan Note (Signed)
Has improved.  She is going to cardiac rehab now.  Off crestor.  Discussed restarting a lower dose.  Treat UTI.  Follow.

## 2016-07-17 NOTE — Assessment & Plan Note (Signed)
Off cholesterol medication for now.  Discussed restarting a lower dose.  Feels better.  She wants to discuss with Dr Clayborn Bigness.  appt 07/17/16.

## 2016-07-17 NOTE — Assessment & Plan Note (Signed)
Increased stress.  Discussed with her today.  Overall relatively stable.  Follow.

## 2016-07-17 NOTE — Assessment & Plan Note (Signed)
Seeing Dr Clayborn Bigness.  Has appt tomorrow.  Going to cardiac rehab.  Follow.

## 2016-07-20 ENCOUNTER — Encounter: Payer: BC Managed Care – PPO | Admitting: *Deleted

## 2016-07-20 DIAGNOSIS — Z955 Presence of coronary angioplasty implant and graft: Secondary | ICD-10-CM

## 2016-07-20 DIAGNOSIS — I214 Non-ST elevation (NSTEMI) myocardial infarction: Secondary | ICD-10-CM

## 2016-07-20 DIAGNOSIS — Z9861 Coronary angioplasty status: Secondary | ICD-10-CM | POA: Diagnosis not present

## 2016-07-20 NOTE — Progress Notes (Signed)
Daily Session Note  Patient Details  Name: Evelin Cake MRN: [id number] Date of Birth: 1956/06/20 Referring Provider:   Flowsheet Row Cardiac Rehab from 07/06/2016 in [facility]  Referring Provider  Lujean Amel MD      Encounter Date: 07/20/2016  Check In:     Session Check In - 07/20/16 1623      Check-In   Location [facility]-Cardiac & Pulmonary Rehab   Staff Present Heath Lark, RN, BSN, Laveda Norman, BS, ACSM CEP, Exercise Physiologist;Carroll Enterkin, RN, BSN   Supervising physician immediately available to respond to emergencies See telemetry face sheet for immediately available ER MD   Medication changes reported     Yes   Comments Taking MacroBid for 15 days for UTI    Fall or balance concerns reported    No   Warm-up and Cool-down Performed on first and last piece of equipment   Resistance Training Performed Yes   VAD Patient? No     Pain Assessment   Currently in Pain? No/denies   Multiple Pain Sites No         Goals Met:  Independence with exercise equipment Exercise tolerated well No report of cardiac concerns or symptoms  Goals Unmet:  Not Applicable  Comments: Pt able to follow exercise prescription today without complaint.  Will continue to monitor for progression. Omiting strength training, had pain after last encounter/disc problems.    Dr. Emily Filbert is Medical Director for [facility].

## 2016-07-22 ENCOUNTER — Encounter: Payer: BC Managed Care – PPO | Admitting: *Deleted

## 2016-07-22 ENCOUNTER — Encounter: Payer: Self-pay | Admitting: *Deleted

## 2016-07-22 DIAGNOSIS — I214 Non-ST elevation (NSTEMI) myocardial infarction: Secondary | ICD-10-CM

## 2016-07-22 DIAGNOSIS — Z955 Presence of coronary angioplasty implant and graft: Secondary | ICD-10-CM

## 2016-07-22 DIAGNOSIS — Z9861 Coronary angioplasty status: Secondary | ICD-10-CM | POA: Diagnosis not present

## 2016-07-22 NOTE — Progress Notes (Signed)
Daily Session Note  Patient Details  Name: Morgan Moore MRN: [id number] Date of Birth: 1956/09/26 Referring Provider:   Flowsheet Row Cardiac Rehab from 07/06/2016 in [facility] Card[facility]  Referring Provider  Lujean Amel MD      Encounter Date: 07/22/2016  Check In:     Session Check In - 07/22/16 1656      Check-In   Location [facility]-Cardiac & Pulmonary Rehab   Staff Present Nyoka Cowden, RN, BSN, MA;Telvin Reinders, RN, Vickki Hearing, BA, ACSM CEP, Exercise Physiologist   Supervising physician immediately available to respond to emergencies See telemetry face sheet for immediately available ER MD   Medication changes reported     No   Fall or balance concerns reported    No   Warm-up and Cool-down Performed on first and last piece of equipment   Resistance Training Performed Yes   VAD Patient? No     Pain Assessment   Currently in Pain? No/denies         Goals Met:  Proper associated with RPD/PD & O2 Sat No report of cardiac concerns or symptoms  Goals Unmet:  Not Applicable  Comments:     Dr. Emily Filbert is Medical Director for [place] [facility].

## 2016-07-22 NOTE — Progress Notes (Signed)
Cardiac Individual Treatment Plan  Patient Details  Name: Elliannah Wayment MRN: [id number] Date of Birth: [birth year] Referring Provider:   Flowsheet Row Cardiac Rehab from 07/06/2016 in [facility] Cardiac and Pulmonary Rehab  Referring Provider  Lujean Amel MD      Initial Encounter Date:  Flowsheet Row Cardiac Rehab from 07/06/2016 in [facility] Cardiac and Pulmonary Rehab  Date  07/06/16  Referring Provider  Lujean Amel MD      Visit Diagnosis: NSTEMI (non-ST elevated myocardial infarction) [facility])  Status post coronary artery stent placement  Patient's Home Medications on Admission:  Current Outpatient Prescriptions:  .  ALPRAZolam (XANAX) 0.25 MG tablet, Take 1 tablet (0.25 mg total) by mouth at bedtime as needed., Disp: 30 tablet, Rfl: 0 .  aspirin EC 81 MG tablet, Take 0.81 mg by mouth daily., Disp: , Rfl:  .  clopidogrel (PLAVIX) 75 MG tablet, Take 75 mg by mouth daily., Disp: , Rfl:  .  EPINEPHrine 0.3 mg/0.3 mL IJ SOAJ injection, 0.3 mg as needed., Disp: , Rfl:  .  fexofenadine (ALLEGRA) 180 MG tablet, Take 180 mg by mouth daily., Disp: , Rfl:  .  Mesalamine (DELZICOL) 400 MG CPDR DR capsule, Take 6 capsules (2,400 mg total) by mouth daily., Disp: 180 capsule, Rfl: 2 .  nitrofurantoin, macrocrystal-monohydrate, (MACROBID) 100 MG capsule, Take 1 capsule (100 mg total) by mouth 2 (two) times daily., Disp: 10 capsule, Rfl: 0 .  omeprazole (PRILOSEC) 40 MG capsule, Take 40 mg by mouth daily. Reported on 04/13/2016, Disp: , Rfl:  .  ondansetron (ZOFRAN ODT) 4 MG disintegrating tablet, Take 1 tablet (4 mg total) by mouth every 8 (eight) hours as needed for nausea or vomiting., Disp: 20 tablet, Rfl: 0 .  rizatriptan (MAXALT) 5 MG tablet, USE AS DIRECTED, Disp: 10 tablet, Rfl: 0 .  rosuvastatin (CRESTOR) 5 MG tablet, Take 1 tablet (5 mg total) by mouth daily., Disp: 30 tablet, Rfl: 1 .  tobramycin-dexamethasone (TOBRADEX) ophthalmic solution, Place 1 drop into the right eye every 6  (six) hours., Disp: 5 mL, Rfl: 0 .  verapamil (CALAN-SR) 120 MG CR tablet, Take 1 tablet (120 mg total) by mouth daily., Disp: 30 tablet, Rfl: 5  Past Medical History: Past Medical History:  Diagnosis Date  . Allergy   . Chicken pox   . Heart murmur   . Hyperlipidemia   . Hypertension   . Migraines    hormonal, puberty  . Nephrolithiasis   . Phlebitis     Tobacco Use: History  Smoking Status  . Never Smoker  Smokeless Tobacco  . Never Used    Labs: Recent Review Flowsheet Data    Labs for ITP Cardiac and Pulmonary Rehab Latest Ref Rng & Units 09/24/2014 02/01/2015 11/07/2015 04/09/2016   Cholestrol 0 - 200 mg/dL 191 249(A) 254(H) 258(H)   LDLCALC 0 - 99 mg/dL 107(H) 168 169(H) 178(H)   HDL >39.00 mg/dL 50 49 56.50 54.20   Trlycerides 0.0 - 149.0 mg/dL 172 198(A) 142.0 131.0       Exercise Target Goals:    Exercise Program Goal: Individual exercise prescription set with THRR, safety & activity barriers. Participant demonstrates ability to understand and report RPE using BORG scale, to self-measure pulse accurately, and to acknowledge the importance of the exercise prescription.  Exercise Prescription Goal: Starting with aerobic activity 30 plus minutes a day, 3 days per week for initial exercise prescription. Provide home exercise prescription and guidelines that participant acknowledges understanding prior to discharge.  Activity Barriers &  Risk Stratification:     Activity Barriers & Cardiac Risk Stratification - 07/06/16 1409      Activity Barriers & Cardiac Risk Stratification   Activity Barriers History of Falls;Balance Concerns;Deconditioning;Muscular Weakness;Other (comment)   Comments occasional dizzy spells, r hip bone spurs      6 Minute Walk:     6 Minute Walk    Row Name 07/06/16 1404         6 Minute Walk   Phase Initial     Distance 1100 feet     Walk Time 6 minutes     # of Rest Breaks 0     MPH 2.08     METS 3.14     RPE 11     VO2  Peak 11     Symptoms No     Resting HR 78 bpm     Resting BP 128/64     Max Ex. HR 96 bpm     Max Ex. BP 158/70     2 Minute Post BP 126/64        Initial Exercise Prescription:     Initial Exercise Prescription - 07/06/16 1400      Date of Initial Exercise RX and Referring Provider   Date 07/06/16   Referring Provider Lujean Amel MD     Treadmill   MPH 1.8   Grade 0.5   Minutes 15   METs 2.5     NuStep   Level 1   Minutes 15   METs 2     Biostep-RELP   Level 1   Minutes 15   METs 2     Prescription Details   Frequency (times per week) 3   Duration Progress to 45 minutes of aerobic exercise without signs/symptoms of physical distress     Intensity   THRR 40-80% of Max Heartrate 110-143   Ratings of Perceived Exertion 11-15   Perceived Dyspnea 0-4     Progression   Progression Continue to progress workloads to maintain intensity without signs/symptoms of physical distress.     Resistance Training   Training Prescription Yes   Weight 2 lbs   Reps 10-15      Perform Capillary Blood Glucose checks as needed.  Exercise Prescription Changes:     Exercise Prescription Changes    Row Name 07/06/16 1300 07/16/16 1000           Response to Exercise   Blood Pressure (Admit) 128/84 144/68      Blood Pressure (Exercise) 158/70 162/82      Blood Pressure (Exit) 126/64 138/82      Heart Rate (Admit) 83 bpm 73 bpm      Heart Rate (Exercise) 96 bpm 97 bpm      Heart Rate (Exit) 78 bpm 75 bpm      Rating of Perceived Exertion (Exercise) 11 11        Progression   Average METs  - 2.63        Resistance Training   Training Prescription  - Yes      Weight  - 2      Reps  - 10-15        Interval Training   Interval Training  - No        Treadmill   MPH  - 1      Grade  - 0      Minutes  - 15      METs  - 1.77  Biostep-RELP   Level  - 1      Minutes  - 15         Exercise Comments:     Exercise Comments    Row Name 07/06/16 1409  07/15/16 1746 07/16/16 1033       Exercise Comments 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 First full day of exercise!  Nobie was oriented to gym and equipment including functions, settings, policies, and procedures.  Talah's individual exercise prescription and treatment plan were reviewed.  All starting workloads were established based on the results of the 6 minute walk test done at initial orientation visit.  The plan for exercise progression was also introduced and progression will be customized based on patient's performance and goals. Danyetta sees her PCP today so cannot attend. She called this morning and was concerned that her back was sore aroun T8-9 where she had fractures years ago.  I advised she use less weight or no weight next time and continue to monitor her pain.  She says it was already hurting last week before she came to class.        Discharge Exercise Prescription (Final Exercise Prescription Changes):     Exercise Prescription Changes - 07/16/16 1000      Response to Exercise   Blood Pressure (Admit) 144/68   Blood Pressure (Exercise) 162/82   Blood Pressure (Exit) 138/82   Heart Rate (Admit) 73 bpm   Heart Rate (Exercise) 97 bpm   Heart Rate (Exit) 75 bpm   Rating of Perceived Exertion (Exercise) 11     Progression   Average METs 2.63     Resistance Training   Training Prescription Yes   Weight 2   Reps 10-15     Interval Training   Interval Training No     Treadmill   MPH 1   Grade 0   Minutes 15   METs 1.77     Biostep-RELP   Level 1   Minutes 15      Nutrition:  Target Goals: Understanding of nutrition guidelines, daily intake of sodium <1527m, cholesterol <2031m calories 30% from fat and 7% or less from saturated fats, daily to have 5 or more servings of fruits and vegetables.  Biometrics:     Pre Biometrics - 07/06/16 1418      Pre Biometrics   Height 5' 3"  (1.6 m)   Weight 152 lb 8 oz (69.2 kg)    Waist Circumference 33 inches   Hip Circumference 40.5 inches   Waist to Hip Ratio 0.81 %   BMI (Calculated) 27.1   Single Leg Stand 0 seconds       Nutrition Therapy Plan and Nutrition Goals:     Nutrition Therapy & Goals - 07/06/16 1442      Intervention Plan   Intervention Prescribe, educate and counsel regarding individualized specific dietary modifications aiming towards targeted core components such as weight, hypertension, lipid management, diabetes, heart failure and other comorbidities.   Expected Outcomes Short Term Goal: Understand basic principles of dietary content, such as calories, fat, sodium, cholesterol and nutrients.;Short Term Goal: A plan has been developed with personal nutrition goals set during dietitian appointment.;Long Term Goal: Adherence to prescribed nutrition plan.      Nutrition Discharge: Rate Your Plate Scores:     Nutrition Assessments - 07/06/16 1443      Rate Your Plate Scores   Pre Score 57   Pre Score %  63 %      Nutrition Goals Re-Evaluation:   Psychosocial: Target Goals: Acknowledge presence or absence of depression, maximize coping skills, provide positive support system. Participant is able to verbalize types and ability to use techniques and skills needed for reducing stress and depression.  Initial Review & Psychosocial Screening:     Initial Psych Review & Screening - 07/06/16 1453      Family Dynamics   Concerns --  Attended couseling after daughter's death      Quality of Life Scores:     Quality of Life - 07/06/16 1438      Quality of Life Scores   Health/Function Pre 20.92 %   Socioeconomic Pre 27.92 %   Psych/Spiritual Pre 23.33 %   Family Pre 29.38 %   GLOBAL Pre 24.03 %      PHQ-9: Recent Review Flowsheet Data    Depression screen [facility] 2/9 07/06/2016   Decreased Interest 3   Down, Depressed, Hopeless 0   PHQ - 2 Score 3   Altered sleeping 0   Tired, decreased energy 3   Change in appetite 0    Feeling bad or failure about yourself  0   Trouble concentrating 0   Moving slowly or fidgety/restless 0   Suicidal thoughts 0   PHQ-9 Score 6   Difficult doing work/chores Very difficult      Psychosocial Evaluation and Intervention:     Psychosocial Evaluation - 07/15/16 1733      Psychosocial Evaluation & Interventions   Interventions Encouraged to exercise with the program and follow exercise prescription   Comments Counselor met with Ms. Tamera Punt today for initial psychosocial evaluation.  She is a 60 year old who reports "passing out" at a wedding in 07/02/2023 due to a heart attack and resulted in surgery with (2) stents inserted.  She lives alone, but reports having a strong support system with sisters and a brother who live close by and "tons of friends" and church family who are involved in her life.  Ms. C states she just recovered from a UTI and has been feeling extremely weak since that time.  She sleeps "okay" with about (5) hours each night.  She reports to having been very sedentary since the heart attack.  Ms. C states her appetite is improving since the UTI.  She denies a history of depression or anxiety or any current symptoms and states her mood is typically positive.  Ms. C lost a daughter 5 years ago and a niece passed away this past 2023/07/02, so she has been grieving those losses.  She has a son who just got married and lives 2 hours away which brings her "great joy."  Ms. Loletha Grayer has goals to be stronger and not dizzy as much.  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   Counselor provided supportive services and will continue to follow with Ms. C throughout the course of this program.        Psychosocial Re-Evaluation:   Vocational Rehabilitation: Provide vocational rehab assistance to qualifying candidates.   Vocational Rehab Evaluation & Intervention:     Vocational Rehab  - 07/06/16 1453      Initial Vocational Rehab Evaluation & Intervention   Assessment shows need for Vocational Rehabilitation No      Education: Education Goals: Education classes will be provided on a weekly basis, covering  required topics. Participant will state understanding/return demonstration of topics presented.  Learning Barriers/Preferences:     Learning Barriers/Preferences - 07/06/16 1452      Learning Barriers/Preferences   Learning Barriers None   Learning Preferences None      Education Topics: General Nutrition Guidelines/Fats and Fiber: -Group instruction provided by verbal, written material, models and posters to present the general guidelines for heart healthy nutrition. Gives an explanation and review of dietary fats and fiber.   Controlling Sodium/Reading Food Labels: -Group verbal and written material supporting the discussion of sodium use in heart healthy nutrition. Review and explanation with models, verbal and written materials for utilization of the food label.   Exercise Physiology & Risk Factors: - Group verbal and written instruction with models to review the exercise physiology of the cardiovascular system and associated critical values. Details cardiovascular disease risk factors and the goals associated with each risk factor.   Aerobic Exercise & Resistance Training: - Gives group verbal and written discussion on the health impact of inactivity. On the components of aerobic and resistive training programs and the benefits of this training and how to safely progress through these programs.   Flexibility, Balance, General Exercise Guidelines: - Provides group verbal and written instruction on the benefits of flexibility and balance training programs. Provides general exercise guidelines with specific guidelines to those with heart or lung disease. Demonstration and skill practice provided.   Stress Management: - Provides group verbal and  written instruction about the health risks of elevated stress, cause of high stress, and healthy ways to reduce stress.   Depression: - Provides group verbal and written instruction on the correlation between heart/lung disease and depressed mood, treatment options, and the stigmas associated with seeking treatment.   Anatomy & Physiology of the Heart: - Group verbal and written instruction and models provide basic cardiac anatomy and physiology, with the coronary electrical and arterial systems. Review of: AMI, Angina, Valve disease, Heart Failure, Cardiac Arrhythmia, Pacemakers, and the ICD.   Cardiac Procedures: - Group verbal and written instruction and models to describe the testing methods done to diagnose heart disease. Reviews the outcomes of the test results. Describes the treatment choices: Medical Management, Angioplasty, or Coronary Bypass Surgery.   Cardiac Medications: - Group verbal and written instruction to review commonly prescribed medications for heart disease. Reviews the medication, class of the drug, and side effects. Includes the steps to properly store meds and maintain the prescription regimen. Flowsheet Row Cardiac Rehab from 07/20/2016 in [facility] Cardiac and Pulmonary Rehab  Date  07/15/16  Educator  SB  Instruction Review Code  2- meets goals/outcomes      Go Sex-Intimacy & Heart Disease, Get SMART - Goal Setting: - Group verbal and written instruction through game format to discuss heart disease and the return to sexual intimacy. Provides group verbal and written material to discuss and apply goal setting through the application of the S.M.A.R.T. Method.   Other Matters of the Heart: - Provides group verbal, written materials and models to describe Heart Failure, Angina, Valve Disease, and Diabetes in the realm of heart disease. Includes description of the disease process and treatment options available to the cardiac patient.   Exercise & Equipment  Safety: - Individual verbal instruction and demonstration of equipment use and safety with use of the equipment. Flowsheet Row Cardiac Rehab from 07/20/2016 in [facility] Cardiac and Pulmonary Rehab  Date  07/06/16  Educator   SB  Instruction Review Code  2- meets goals/outcomes  Infection Prevention: - Provides verbal and written material to individual with discussion of infection control including proper hand washing and proper equipment cleaning during exercise session. Flowsheet Row Cardiac Rehab from 07/20/2016 in [facility] Cardiac and Pulmonary Rehab  Date  07/06/16  Educator  SB  Instruction Review Code  2- meets goals/outcomes      Falls Prevention: - Provides verbal and written material to individual with discussion of falls prevention and safety. Flowsheet Row Cardiac Rehab from 07/20/2016 in [facility] Cardiac and Pulmonary Rehab  Date  07/06/16  Educator  SB  Instruction Review Code  2- meets goals/outcomes      Diabetes: - Individual verbal and written instruction to review signs/symptoms of diabetes, desired ranges of glucose level fasting, after meals and with exercise. Advice that pre and post exercise glucose checks will be done for 3 sessions at entry of program.    Knowledge Questionnaire Score:     Knowledge Questionnaire Score - 07/06/16 1452      Knowledge Questionnaire Score   Pre Score 23/28      Core Components/Risk Factors/Patient Goals at Admission:     Personal Goals and Risk Factors at Admission - 07/06/16 1449      Core Components/Risk Factors/Patient Goals on Admission   Sedentary Yes   Intervention Provide advice, education, support and counseling about physical activity/exercise needs.;Develop an individualized exercise prescription for aerobic and resistive training based on initial evaluation findings, risk stratification, comorbidities and participant's personal goals.   Expected Outcomes Achievement of increased cardiorespiratory fitness and  enhanced flexibility, muscular endurance and strength shown through measurements of functional capacity and personal statement of participant.   Increase Strength and Stamina Yes   Intervention Provide advice, education, support and counseling about physical activity/exercise needs.;Develop an individualized exercise prescription for aerobic and resistive training based on initial evaluation findings, risk stratification, comorbidities and participant's personal goals.   Expected Outcomes Achievement of increased cardiorespiratory fitness and enhanced flexibility, muscular endurance and strength shown through measurements of functional capacity and personal statement of participant.   Hypertension Yes   Intervention Provide education on lifestyle modifcations including regular physical activity/exercise, weight management, moderate sodium restriction and increased consumption of fresh fruit, vegetables, and low fat dairy, alcohol moderation, and smoking cessation.;Monitor prescription use compliance.   Expected Outcomes Short Term: Continued assessment and intervention until BP is < 140/97m HG in hypertensive participants. < 130/829mHG in hypertensive participants with diabetes, heart failure or chronic kidney disease.;Long Term: Maintenance of blood pressure at goal levels.   Lipids Yes   Intervention Provide education and support for participant on nutrition & aerobic/resistive exercise along with prescribed medications to achieve LDL <7073mHDL >40m13m Expected Outcomes Short Term: Participant states understanding of desired cholesterol values and is compliant with medications prescribed. Participant is following exercise prescription and nutrition guidelines.;Long Term: Cholesterol controlled with medications as prescribed, with individualized exercise RX and with personalized nutrition plan. Value goals: LDL < 70mg15mL > 40 mg.      Core Components/Risk Factors/Patient Goals Review:    Core  Components/Risk Factors/Patient Goals at Discharge (Final Review):    ITP Comments:     ITP Comments    Row Name 07/06/16 1438 07/15/16 1746 07/16/16 1035 07/22/16 0654     ITP Comments Initial ITP created during [employer] Review. Diagnosis documentation can be found [facility] encounter 06/16/2016 First full day of exercise!  KarenJohanneoriented to gym and equipment including functions, settings, policies, and procedures.  Saphyre's individual  exercise prescription and treatment plan were reviewed.  All starting workloads were established based on the results of the 6 minute walk test done at initial orientation visit.  The plan for exercise progression was also introduced and progression will be customized based on patient's performance and goals. Nyashia sees her PCP today so cannot attend. She called this morning and was concerned that her back was sore aroun T8-9 where she had fractures years ago.  I advised she use less weight or no weight next time and continue to monitor her pain.  She says it was already hurting last week before she came to class. 30 day review. Continue with ITP unless changes noted by Medical Director at signature of review.  Has started program this week       Comments:

## 2016-07-23 ENCOUNTER — Encounter: Payer: BC Managed Care – PPO | Admitting: *Deleted

## 2016-07-23 DIAGNOSIS — I214 Non-ST elevation (NSTEMI) myocardial infarction: Secondary | ICD-10-CM

## 2016-07-23 DIAGNOSIS — Z9861 Coronary angioplasty status: Secondary | ICD-10-CM | POA: Diagnosis not present

## 2016-07-23 NOTE — Progress Notes (Signed)
Daily Session Note  Patient Details  Name: Allana Shrestha MRN: [id number] Date of Birth: 05-Apr-1956 Referring Provider:   Flowsheet Row Cardiac Rehab from 07/06/2016 in [facility] Cardiac and Pulmonary Rehab  Referring Provider  Lujean Amel MD      Encounter Date: 07/23/2016  Check In:     Session Check In - 07/23/16 1647      Check-In   Location [facility]-Cardiac & Pulmonary Rehab   Staff Present Gerlene Burdock, RN, BSN;Mary Kellie Shropshire, RN, BSN, Kela Millin, BA, ACSM CEP, Exercise Physiologist   Supervising physician immediately available to respond to emergencies See telemetry face sheet for immediately available ER MD   Medication changes reported     No   Fall or balance concerns reported    No   Warm-up and Cool-down Performed on first and last piece of equipment   Resistance Training Performed Yes   VAD Patient? No     Pain Assessment   Currently in Pain? No/denies         Goals Met:  Proper associated with RPD/PD & O2 Sat  Goals Unmet:  Not Applicable  Comments:     Dr. Emily Filbert is Medical Director for [facility].

## 2016-07-24 ENCOUNTER — Encounter: Payer: Self-pay | Admitting: *Deleted

## 2016-07-24 ENCOUNTER — Telehealth: Payer: Self-pay | Admitting: *Deleted

## 2016-07-24 NOTE — Telephone Encounter (Signed)
I called Morgan Moore to check on her. Morgan Moore said last night was a little better with her heart rate after Cardiac Rehab. I suggested to Morgan Moore to drink a lot of water before she gets on the treadmill. Morgan Moore said she finally felt able to drive for the first time in a long time today since before she felt too weak to drive due to her UTI etc. Morgan Moore said her 61 years old daughter died several years ago. Her 74 year old daughter was hit by a truck on her way home from teaching school. Morgan Moore reported that her 61 year old niece died of a massive heart attack a couple of weeks ago and left a 61 year old and twins 61 years old.Morgan Moore said they have a bad family history of heart disease-ie her father died of a massive heart attack and Morgan Moore reported that after the ambulance took her father to the hospital she said she her brother that she needed to take him to the hospital and she never got him there -he died of a heart attack also. Morgan Moore said she divorced her husband. 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

## 2016-07-27 ENCOUNTER — Encounter: Payer: BC Managed Care – PPO | Admitting: *Deleted

## 2016-07-27 DIAGNOSIS — I214 Non-ST elevation (NSTEMI) myocardial infarction: Secondary | ICD-10-CM

## 2016-07-27 DIAGNOSIS — Z9861 Coronary angioplasty status: Secondary | ICD-10-CM | POA: Diagnosis not present

## 2016-07-27 NOTE — Progress Notes (Signed)
Daily Session Note  Patient Details  Name: Morgan Moore MRN: [id number] Date of Birth: 08/05/1956 Referring Provider:   Flowsheet Row Cardiac Rehab from 07/06/2016 in [facility] Cardiac and Pulmonary Rehab  Referring Provider  Lujean Amel MD      Encounter Date: 07/27/2016  Check In:     Session Check In - 07/27/16 1639      Check-In   Location [facility]-Cardiac & Pulmonary Rehab   Staff Present Gerlene Burdock, RN, BSN;Susanne Bice, RN, BSN, Laveda Norman, BS, ACSM CEP, Exercise Physiologist   Supervising physician immediately available to respond to emergencies See telemetry face sheet for immediately available ER MD   Medication changes reported     No   Warm-up and Cool-down Performed on first and last piece of equipment   Resistance Training Performed Yes   VAD Patient? No     Pain Assessment   Currently in Pain? No/denies         Goals Met:  Proper associated with RPD/PD & O2 Sat No report of cardiac concerns or symptoms  Goals Unmet:  Not Applicable  Comments:     Dr. Emily Filbert is Medical Director for [place] and [facility].

## 2016-07-29 ENCOUNTER — Encounter: Payer: BC Managed Care – PPO | Admitting: *Deleted

## 2016-07-29 DIAGNOSIS — I214 Non-ST elevation (NSTEMI) myocardial infarction: Secondary | ICD-10-CM

## 2016-07-29 DIAGNOSIS — Z955 Presence of coronary angioplasty implant and graft: Secondary | ICD-10-CM

## 2016-07-29 DIAGNOSIS — Z9861 Coronary angioplasty status: Secondary | ICD-10-CM | POA: Diagnosis not present

## 2016-07-29 NOTE — Progress Notes (Signed)
Daily Session Note  Patient Details  Name: Morgan Moore MRN: [id number] Date of Birth: [birth year] Referring Provider:   Flowsheet Row Cardiac Rehab from 07/06/2016 in [facility] Cardiac and Pulmonary Rehab  Referring Provider  Lujean Amel MD      Encounter Date: 07/29/2016  Check In:     Session Check In - 07/29/16 1625      Check-In   Location [facility]-Cardiac & Pulmonary Rehab   Staff Present Nyoka Cowden, RN, BSN, MA;Carroll Enterkin, RN, Vickki Hearing, BA, ACSM CEP, Exercise Physiologist   Supervising physician immediately available to respond to emergencies See telemetry face sheet for immediately available ER MD   Medication changes reported     No   Fall or balance concerns reported    No   Warm-up and Cool-down Performed on first and last piece of equipment   Resistance Training Performed Yes   VAD Patient? No           Exercise Prescription Changes - 07/29/16 1400      Exercise Review   Progression Yes     Response to Exercise   Blood Pressure (Admit) 142/82   Blood Pressure (Exercise) 138/60   Blood Pressure (Exit) 132/72   Heart Rate (Admit) 65 bpm   Heart Rate (Exercise) 99 bpm   Heart Rate (Exit) 89 bpm   Rating of Perceived Exertion (Exercise) 12     Progression   Average METs 1.75     Resistance Training   Training Prescription Yes   Weight 2   Reps 10-15     Interval Training   Interval Training No     NuStep   Level 3   Minutes 15   METs 1.5     Biostep-RELP   Level 1   Minutes 15   METs 2      Goals Met:  Independence with exercise equipment Exercise tolerated well No report of cardiac concerns or symptoms Strength training completed today  Goals Unmet:  Not Applicable  Comments:Pt able to follow exercise prescription today without complaint.  Will continue to monitor for progression.   Dr. Emily Filbert is Medical Director for [place] and [facility].

## 2016-07-31 ENCOUNTER — Encounter: Payer: Self-pay | Admitting: *Deleted

## 2016-07-31 DIAGNOSIS — I214 Non-ST elevation (NSTEMI) myocardial infarction: Secondary | ICD-10-CM

## 2016-08-03 ENCOUNTER — Telehealth: Payer: Self-pay | Admitting: *Deleted

## 2016-08-03 ENCOUNTER — Encounter: Payer: Self-pay | Admitting: *Deleted

## 2016-08-03 DIAGNOSIS — I214 Non-ST elevation (NSTEMI) myocardial infarction: Secondary | ICD-10-CM

## 2016-08-03 DIAGNOSIS — Z955 Presence of coronary angioplasty implant and graft: Secondary | ICD-10-CM

## 2016-08-03 DIAGNOSIS — R002 Palpitations: Secondary | ICD-10-CM | POA: Insufficient documentation

## 2016-08-03 NOTE — Telephone Encounter (Signed)
Morgan Moore called to tell us she is at [facility] for her recurrent  UTI.  She also stated that she is being evaluated for a leaking valve.

## 2016-08-07 ENCOUNTER — Ambulatory Visit (INDEPENDENT_AMBULATORY_CARE_PROVIDER_SITE_OTHER): Payer: BC Managed Care – PPO | Admitting: Internal Medicine

## 2016-08-07 ENCOUNTER — Encounter: Payer: Self-pay | Admitting: Internal Medicine

## 2016-08-07 VITALS — BP 146/90 | HR 66 | Temp 98.0°F | Wt 155.8 lb

## 2016-08-07 DIAGNOSIS — I2510 Atherosclerotic heart disease of native coronary artery without angina pectoris: Secondary | ICD-10-CM

## 2016-08-07 DIAGNOSIS — R531 Weakness: Secondary | ICD-10-CM

## 2016-08-07 DIAGNOSIS — I340 Nonrheumatic mitral (valve) insufficiency: Secondary | ICD-10-CM | POA: Diagnosis not present

## 2016-08-07 DIAGNOSIS — R42 Dizziness and giddiness: Secondary | ICD-10-CM | POA: Diagnosis not present

## 2016-08-07 DIAGNOSIS — E7800 Pure hypercholesterolemia, unspecified: Secondary | ICD-10-CM

## 2016-08-07 DIAGNOSIS — Z87440 Personal history of urinary (tract) infections: Secondary | ICD-10-CM

## 2016-08-07 DIAGNOSIS — I10 Essential (primary) hypertension: Secondary | ICD-10-CM

## 2016-08-07 DIAGNOSIS — F439 Reaction to severe stress, unspecified: Secondary | ICD-10-CM

## 2016-08-07 DIAGNOSIS — K219 Gastro-esophageal reflux disease without esophagitis: Secondary | ICD-10-CM

## 2016-08-07 NOTE — Progress Notes (Signed)
Pre visit review using our clinic review tool, if applicable. No additional management support is needed unless otherwise documented below in the visit note. 

## 2016-08-09 ENCOUNTER — Encounter: Payer: Self-pay | Admitting: Internal Medicine

## 2016-08-09 DIAGNOSIS — Z87440 Personal history of urinary (tract) infections: Secondary | ICD-10-CM | POA: Insufficient documentation

## 2016-08-09 NOTE — Assessment & Plan Note (Signed)
Has overall improved.  Going to cardiac rehab now.  Back on low dose crestor.  No evidence of uti now.  Follow.  Discussed with her today.

## 2016-08-09 NOTE — Assessment & Plan Note (Signed)
Followed by cardiology.  Going to cardiac rehab.  Overall better.  Continue risk factor modification.

## 2016-08-09 NOTE — Assessment & Plan Note (Signed)
Has loop monitor on.  Overall feels better today.  No evidence of uti.  F/u with cardiology.

## 2016-08-09 NOTE — Progress Notes (Signed)
Patient ID: Morgan Moore, female   DOB: 01/30/1956, 60 y.o.   MRN: [id number]   Subjective:    Patient ID: Morgan Moore, female    DOB: 02-26-1956, 60 y.o.   MRN: [id number]  HPI  Patient here for a scheduled follow up.  Has known CAD and is s/p PCI and stent - 05/2016.  Now in cardiac rehab. She is still having increased fatigue.  After discussion, she is overall better.  States she was feeling good last week.  Cooked and did some other chores Sunday.  Sunday pm started feeling light headed.  Felt her heart rate increased.  Blood pressure increased.  Went to [facility] ER.  Note reviewed.  Verapamil was stopped and she was placed on carvedilol.  Loop monitor placed.  She was also given monurol for a uti.  Had rash with monurol.  Taking benadryl.  Reviewed culture.  No need for further abx at this time.  We did discuss her continued problems with persistent intermittent urinary issues.  She requested a referral to Dr Judeth Horn.  She is eating and drinking.  Trying to stay hydrated.  No vomiting.  Bowels stable.    Past Medical History:  Diagnosis Date  . Allergy   . Chicken pox   . Heart murmur   . Hyperlipidemia   . Hypertension   . Migraines    hormonal, puberty  . Nephrolithiasis   . Phlebitis    Past Surgical History:  Procedure Laterality Date  . BREAST BIOPSY    . heart murmur    . TONSILLECTOMY AND ADENOIDECTOMY  1962  . tubaligation  1990   Family History  Problem Relation Age of Onset  . Heart disease Father   . Heart disease Brother   . Cancer Maternal Aunt    Social History   Social History  . Marital status: Divorced    Spouse name: N/A  . Number of children: N/A  . Years of education: N/A   Social History Main Topics  . Smoking status: Never Smoker  . Smokeless tobacco: Never Used  . Alcohol use No  . Drug use: Unknown  . Sexual activity: Not Asked   Other Topics Concern  . None   Social History Narrative  . None    Outpatient Encounter  Prescriptions as of 08/07/2016  Medication Sig  . ALPRAZolam (XANAX) 0.25 MG tablet Take 1 tablet (0.25 mg total) by mouth at bedtime as needed.  Marland Kitchen aspirin EC 81 MG tablet Take 0.81 mg by mouth daily.  . carvedilol (COREG) 6.25 MG tablet Take 6.25 mg by mouth 2 (two) times daily.  . clopidogrel (PLAVIX) 75 MG tablet Take 75 mg by mouth daily.  Marland Kitchen EPINEPHrine 0.3 mg/0.3 mL IJ SOAJ injection 0.3 mg as needed.  . fexofenadine (ALLEGRA) 180 MG tablet Take 180 mg by mouth daily.  . fosfomycin (MONUROL) 3 g PACK Take 3 g by mouth once.  . Mesalamine (DELZICOL) 400 MG CPDR DR capsule Take 6 capsules (2,400 mg total) by mouth daily.  . ondansetron (ZOFRAN ODT) 4 MG disintegrating tablet Take 1 tablet (4 mg total) by mouth every 8 (eight) hours as needed for nausea or vomiting.  . rizatriptan (MAXALT) 5 MG tablet USE AS DIRECTED  . rosuvastatin (CRESTOR) 5 MG tablet Take 1 tablet (5 mg total) by mouth daily.  Marland Kitchen tobramycin-dexamethasone (TOBRADEX) ophthalmic solution Place 1 drop into the right eye every 6 (six) hours.  . [DISCONTINUED] nitrofurantoin, macrocrystal-monohydrate, (MACROBID) 100 MG capsule Take  1 capsule (100 mg total) by mouth 2 (two) times daily.  . [DISCONTINUED] verapamil (CALAN-SR) 120 MG CR tablet Take 1 tablet (120 mg total) by mouth daily.  Marland Kitchen omeprazole (PRILOSEC) 40 MG capsule Take 40 mg by mouth daily. Reported on 04/13/2016   No facility-administered encounter medications on file as of 08/07/2016.     Review of Systems  Constitutional: Negative for appetite change and unexpected weight change.  HENT: Negative for congestion and sinus pressure.   Respiratory: Negative for cough, chest tightness and shortness of breath.   Cardiovascular: Negative for chest pain and leg swelling.       Intermittent episodes of increased heart rate as outlined.    Gastrointestinal: Negative for abdominal pain, diarrhea, nausea and vomiting.  Genitourinary: Negative for difficulty urinating and  dysuria.       No urinary symptoms currently.   Musculoskeletal: Negative for joint swelling and myalgias.  Skin: Negative for color change and rash.  Neurological: Negative for dizziness and headaches.       Previous light headedness.  Will have intermittent episodes.    Psychiatric/Behavioral: Negative for agitation and dysphoric mood.       Objective:     Blood pressure rechecked by me:  144-146/82  Physical Exam  Constitutional: She appears well-developed and well-nourished. No distress.  HENT:  Nose: Nose normal.  Mouth/Throat: Oropharynx is clear and moist.  Neck: Neck supple. No thyromegaly present.  Cardiovascular: Normal rate and regular rhythm.   Pulmonary/Chest: Breath sounds normal. No respiratory distress. She has no wheezes.  Abdominal: Soft. Bowel sounds are normal. There is no tenderness.  Musculoskeletal: She exhibits no edema or tenderness.  Lymphadenopathy:    She has no cervical adenopathy.  Skin: Rash noted. There is erythema.  Psychiatric: She has a normal mood and affect. Her behavior is normal.    BP (!) 146/90   Pulse 66   Temp 98 F (36.7 C) (Oral)   Wt 155 lb 12.8 oz (70.7 kg)   SpO2 98%   BMI 27.60 kg/m  Wt Readings from Last 3 Encounters:  08/07/16 155 lb 12.8 oz (70.7 kg)  07/16/16 154 lb 6.4 oz (70 kg)  07/06/16 152 lb 8 oz (69.2 kg)     Lab Results  Component Value Date   WBC 11.8 (H) 07/02/2016   HGB 13.7 07/02/2016   HCT 40.6 07/02/2016   PLT 431.0 (H) 07/02/2016   GLUCOSE 98 07/02/2016   CHOL 258 (H) 04/09/2016   TRIG 131.0 04/09/2016   HDL 54.20 04/09/2016   LDLCALC 178 (H) 04/09/2016   ALT 21 07/02/2016   AST 23 07/02/2016   NA 137 07/02/2016   K 3.9 07/02/2016   CL 101 07/02/2016   CREATININE 0.90 07/02/2016   BUN 14 07/02/2016   CO2 27 07/02/2016   TSH 2.18 07/02/2016   INR 1.1 09/21/2014    Dg Lumbar Spine 2-3 Views  Result Date: 04/13/2016 CLINICAL DATA:  Fall 12 weeks ago EXAM: LUMBAR SPINE - 2-3 VIEW  COMPARISON:  None. FINDINGS: Anatomic alignment. No vertebral compression deformity. Anterior osteophyte formation at L2-3 with mild disc space narrowing. No definite fracture. IMPRESSION: No acute bony pathology. Electronically Signed   By: Marybelle Killings M.D.   On: 04/13/2016 17:12   Dg Hip Unilat With Pelvis 2-3 Views Left  Result Date: 04/13/2016 CLINICAL DATA:  Status post fall 12 weeks ago with persistent back and left hip pain with mild lip EXAM: DG HIP (WITH OR WITHOUT PELVIS) 2-3V  LEFT COMPARISON:  Pelvis radiograph of December 21, 2014 FINDINGS: The bony pelvis is subjectively will adequately mineralized. There is no lytic or blastic lesion. No acute or old fracture is observed. The observed portions of the sacrum are normal. The pubic rami are intact. AP and lateral views of the left hip reveal mild symmetric narrowing of the joint space. A tiny spur arises from the inferior articular margin of the femoral head. The articular surfaces of the femoral head and acetabulum remains smoothly rounded. The femoral neck, intertrochanteric region, and subtrochanteric region are normal. The overlying soft tissues are unremarkable. IMPRESSION: There is mild symmetric narrowing of the left hip joint space consistent with mild osteoarthritis. There is no acute bony abnormality. Osteoarthritic pleuritic change. Electronically Signed   By: David  Martinique M.D.   On: 04/13/2016 17:20       Assessment & Plan:   Problem List Items Addressed This Visit    CAD (coronary artery disease)    Followed by cardiology.  Going to cardiac rehab.  Overall better.  Continue risk factor modification.       Relevant Medications   carvedilol (COREG) 6.25 MG tablet   Essential hypertension    Blood pressure as outlined.  Just started carvedilol.  Hold on making changes in medication.  Follow pressures.  Follow metabolic panel.        Relevant Medications   carvedilol (COREG) 6.25 MG tablet   GERD (gastroesophageal reflux  disease)    Controlled on prilosec.  Follow.       History of frequent urinary tract infections    Has continued to have intermittent concerns regarding uti.  Presents with light headedness.  She is also concerned about prolapse.  She request referral to see Dr Orinda Kenner for evaluation.        Relevant Orders   Ambulatory referral to Urology   Hypercholesterolemia    Low cholesterol diet and exercise.  On crestor.  Follow lipid panel and liver function tests.        Relevant Medications   carvedilol (COREG) 6.25 MG tablet   Light headedness    Has loop monitor on.  Overall feels better today.  No evidence of uti.  F/u with cardiology.       Mitral regurgitation - Primary    Followed by Dr Clayborn Bigness.        Relevant Medications   carvedilol (COREG) 6.25 MG tablet   Stress    Increased stress.  Discussed with her today.  We discussed referral to psychiatry/counseling.  She denies depression.  Follow.        Weakness    Has overall improved.  Going to cardiac rehab now.  Back on low dose crestor.  No evidence of uti now.  Follow.  Discussed with her today.         Other Visit Diagnoses   None.    I spent 40 minutes with the patient and more than 50% of the time was spent in consultation regarding the above.    Einar Pheasant, MD

## 2016-08-09 NOTE — Assessment & Plan Note (Signed)
Increased stress.  Discussed with her today.  We discussed referral to psychiatry/counseling.  She denies depression.  Follow.

## 2016-08-09 NOTE — Assessment & Plan Note (Signed)
Followed by Dr Clayborn Bigness.

## 2016-08-09 NOTE — Assessment & Plan Note (Signed)
Controlled on prilosec.  Follow.

## 2016-08-09 NOTE — Assessment & Plan Note (Signed)
Low cholesterol diet and exercise.  On crestor.  Follow lipid panel and liver function tests.

## 2016-08-09 NOTE — Assessment & Plan Note (Addendum)
Has continued to have intermittent concerns regarding uti.  Presents with light headedness.  She is also concerned about prolapse.  She request referral to see Dr Orinda Kenner for evaluation.

## 2016-08-09 NOTE — Assessment & Plan Note (Signed)
Blood pressure as outlined.  Just started carvedilol.  Hold on making changes in medication.  Follow pressures.  Follow metabolic panel.

## 2016-08-10 ENCOUNTER — Encounter: Payer: BC Managed Care – PPO | Attending: Internal Medicine

## 2016-08-10 DIAGNOSIS — I214 Non-ST elevation (NSTEMI) myocardial infarction: Secondary | ICD-10-CM

## 2016-08-10 DIAGNOSIS — Z9861 Coronary angioplasty status: Secondary | ICD-10-CM | POA: Diagnosis not present

## 2016-08-10 DIAGNOSIS — Z955 Presence of coronary angioplasty implant and graft: Secondary | ICD-10-CM

## 2016-08-10 DIAGNOSIS — I213 ST elevation (STEMI) myocardial infarction of unspecified site: Secondary | ICD-10-CM | POA: Insufficient documentation

## 2016-08-10 NOTE — Progress Notes (Signed)
Daily Session Note  Patient Details  Name: Morgan Moore MRN: [id number] Date of Birth: 1956/09/17 Referring Provider:   Flowsheet Row Cardiac Rehab from 07/06/2016 in [facility] Cardiac and Pulmonary Rehab  Referring Provider  Lujean Amel MD      Encounter Date: 08/10/2016  Check In:     Session Check In - 08/10/16 1640      Check-In   Location [facility]-Cardiac & Pulmonary Rehab   Staff Present Heath Lark, RN, BSN, Laveda Norman, BS, ACSM CEP, Exercise Physiologist;Jahaad Penado Oletta Darter, BA, ACSM CEP, Exercise Physiologist   Supervising physician immediately available to respond to emergencies See telemetry face sheet for immediately available ER MD   Medication changes reported     Yes   Comments Discontinued lisinopril - started Carvedelol   Fall or balance concerns reported    No   Warm-up and Cool-down Performed on first and last piece of equipment   Resistance Training Performed Yes   VAD Patient? No     Pain Assessment   Currently in Pain? No/denies   Multiple Pain Sites No         Goals Met:  Independence with exercise equipment Exercise tolerated well No report of cardiac concerns or symptoms Strength training completed today  Goals Unmet:  Not Applicable  Comments: Pt able to follow exercise prescription today without complaint.  Will continue to monitor for progression.    Dr. Emily Filbert is Medical Director for [place] and [facility].

## 2016-08-12 ENCOUNTER — Encounter: Payer: BC Managed Care – PPO | Admitting: *Deleted

## 2016-08-12 DIAGNOSIS — Z9861 Coronary angioplasty status: Secondary | ICD-10-CM | POA: Diagnosis not present

## 2016-08-12 DIAGNOSIS — I214 Non-ST elevation (NSTEMI) myocardial infarction: Secondary | ICD-10-CM

## 2016-08-12 DIAGNOSIS — Z955 Presence of coronary angioplasty implant and graft: Secondary | ICD-10-CM

## 2016-08-12 NOTE — Progress Notes (Signed)
Daily Session Note  Patient Details  Name: Morgan Moore MRN: [id number] Date of Birth: 1956-04-12 Referring Provider:   Flowsheet Row Cardiac Rehab from 07/06/2016 in [facility] Cardiac and Pulmonary Rehab  Referring Provider  Lujean Amel MD      Encounter Date: 08/12/2016  Check In:     Session Check In - 08/12/16 1619      Check-In   Location [facility]-Cardiac & Pulmonary Rehab   Staff Present Alberteen Sam, MA, ACSM RCEP, Exercise Physiologist;Amanda Oletta Darter, BA, ACSM CEP, Exercise Physiologist;Carroll Enterkin, RN, BSN   Supervising physician immediately available to respond to emergencies See telemetry face sheet for immediately available ER MD   Medication changes reported     No   Fall or balance concerns reported    No   Warm-up and Cool-down Performed on first and last piece of equipment   Resistance Training Performed Yes   VAD Patient? No     Pain Assessment   Currently in Pain? No/denies   Multiple Pain Sites No           Exercise Prescription Changes - 08/12/16 1200      Exercise Review   Progression Yes     Response to Exercise   Blood Pressure (Admit) 124/70   Blood Pressure (Exercise) 134/70   Blood Pressure (Exit) 144/82   Heart Rate (Admit) 72 bpm   Heart Rate (Exercise) 90 bpm   Heart Rate (Exit) 68 bpm   Rating of Perceived Exertion (Exercise) 12   Duration Progress to 45 minutes of aerobic exercise without signs/symptoms of physical distress   Intensity THRR unchanged     Progression   Progression Continue to progress workloads to maintain intensity without signs/symptoms of physical distress.   Average METs 1.7     Resistance Training   Training Prescription No   Weight --  doesnt do due to back  pain     Interval Training   Interval Training No     NuStep   Level 3   Minutes 15   METs 1.4     Biostep-RELP   Level 1   Minutes 15   METs 2      Goals Met:  Independence with exercise equipment Exercise tolerated  well No report of cardiac concerns or symptoms Strength training completed today  Goals Unmet:  Not Applicable  Comments: Pt able to follow exercise prescription today without complaint.  Will continue to monitor for progression.    Dr. Emily Filbert is Medical Director for [place] and [facility].

## 2016-08-13 ENCOUNTER — Encounter: Payer: BC Managed Care – PPO | Admitting: *Deleted

## 2016-08-13 DIAGNOSIS — I214 Non-ST elevation (NSTEMI) myocardial infarction: Secondary | ICD-10-CM

## 2016-08-13 DIAGNOSIS — Z9861 Coronary angioplasty status: Secondary | ICD-10-CM | POA: Diagnosis not present

## 2016-08-13 DIAGNOSIS — Z955 Presence of coronary angioplasty implant and graft: Secondary | ICD-10-CM

## 2016-08-13 NOTE — Progress Notes (Signed)
Daily Session Note  Patient Details  Name: Morgan Moore MRN: [id number] Date of Birth: Oct 23, 1956 Referring Provider:   Flowsheet Row Cardiac Rehab from 07/06/2016 in [facility] Cardiac and Pulmonary Rehab  Referring Provider  Lujean Amel MD      Encounter Date: 08/13/2016  Check In:     Session Check In - 08/13/16 1734      Check-In   Location [facility]-Cardiac & Pulmonary Rehab   Staff Present Alberteen Sam, MA, ACSM RCEP, Exercise Physiologist;Amanda Oletta Darter, BA, ACSM CEP, Exercise Physiologist;Carroll Enterkin, RN, BSN   Supervising physician immediately available to respond to emergencies See telemetry face sheet for immediately available ER MD   Medication changes reported     No   Fall or balance concerns reported    No   Warm-up and Cool-down Performed on first and last piece of equipment   Resistance Training Performed Yes   VAD Patient? No     Pain Assessment   Currently in Pain? No/denies   Multiple Pain Sites No         Goals Met:  Independence with exercise equipment Exercise tolerated well No report of cardiac concerns or symptoms Strength training completed today  Goals Unmet:  Not Applicable  Comments: Pt able to follow exercise prescription today without complaint.  Will continue to monitor for progression.    Dr. Emily Filbert is Medical Director for [place] and [facility].

## 2016-08-17 ENCOUNTER — Encounter: Payer: BC Managed Care – PPO | Admitting: *Deleted

## 2016-08-17 DIAGNOSIS — I214 Non-ST elevation (NSTEMI) myocardial infarction: Secondary | ICD-10-CM

## 2016-08-17 DIAGNOSIS — Z9861 Coronary angioplasty status: Secondary | ICD-10-CM | POA: Diagnosis not present

## 2016-08-17 DIAGNOSIS — Z955 Presence of coronary angioplasty implant and graft: Secondary | ICD-10-CM

## 2016-08-17 NOTE — Progress Notes (Signed)
Daily Session Note  Patient Details  Name: Morgan Moore MRN: [id number] Date of Birth: [birth year] Referring Provider:   Flowsheet Row Cardiac Rehab from 07/06/2016 in [facility] Cardiac and Pulmonary Rehab  Referring Provider  Lujean Amel MD      Encounter Date: 08/17/2016  Check In:     Session Check In - 08/17/16 1823      Check-In   Staff Present Heath Lark, RN, BSN, CCRP;Carroll Enterkin, RN, Moises Blood, BS, ACSM CEP, Exercise Physiologist   Supervising physician immediately available to respond to emergencies See telemetry face sheet for immediately available ER MD   Medication changes reported     No   Fall or balance concerns reported    No   Warm-up and Cool-down Performed on first and last piece of equipment   Resistance Training Performed Yes   VAD Patient? No     Pain Assessment   Currently in Pain? No/denies         Goals Met:  Exercise tolerated well No report of cardiac concerns or symptoms Strength training completed today  Goals Unmet:  Not Applicable  Comments: Doing well with exercise prescription progression. [name] drove to class for the first time today. She is feeling better, now that the UTI she has had over a month is better.    Dr. Emily Filbert is Medical Director for [place] and [facility].

## 2016-08-19 ENCOUNTER — Encounter: Payer: BC Managed Care – PPO | Admitting: *Deleted

## 2016-08-19 ENCOUNTER — Encounter: Payer: Self-pay | Admitting: *Deleted

## 2016-08-19 DIAGNOSIS — I214 Non-ST elevation (NSTEMI) myocardial infarction: Secondary | ICD-10-CM

## 2016-08-19 DIAGNOSIS — Z955 Presence of coronary angioplasty implant and graft: Secondary | ICD-10-CM

## 2016-08-19 DIAGNOSIS — Z9861 Coronary angioplasty status: Secondary | ICD-10-CM | POA: Diagnosis not present

## 2016-08-19 NOTE — Progress Notes (Signed)
Daily Session Note  Patient Details  Name: Ann Bohne MRN: [id number] Date of Birth: December 30, 1955 Referring Provider:   Flowsheet Row Cardiac Rehab from 07/06/2016 in [facility] Cardiac and Pulmonary Rehab  Referring Provider  Lujean Amel MD      Encounter Date: 08/19/2016  Check In:     Session Check In - 08/19/16 1750      Check-In   Location [facility]-Cardiac & Pulmonary Rehab   Staff Present Nyoka Cowden, RN, BSN, MA;Chayce Robbins, RN, BSN;Susanne Bice, RN, BSN, CCRP   Supervising physician immediately available to respond to emergencies See telemetry face sheet for immediately available ER MD   Medication changes reported     No   Fall or balance concerns reported    No   Warm-up and Cool-down Performed on first and last piece of equipment   Resistance Training Performed Yes   VAD Patient? No     VAD patient   Has back up controller? No     Pain Assessment   Currently in Pain? No/denies         Goals Met:  Proper associated with RPD/PD & O2 Sat Exercise tolerated well  Goals Unmet:  Not Applicable  Comments:     Dr. Emily Filbert is Medical Director for [place] and [facility].

## 2016-08-19 NOTE — Progress Notes (Signed)
Cardiac Individual Treatment Plan  Patient Details  Name: Morgan Moore MRN: [id number] Date of Birth: Jan 14, 1956 Referring Provider:   Flowsheet Row Cardiac Rehab from 07/06/2016 in [facility] Cardiac and Pulmonary Rehab  Referring Provider  Lujean Amel MD      Initial Encounter Date:  Flowsheet Row Cardiac Rehab from 07/06/2016 in [facility] Cardiac and Pulmonary Rehab  Date  07/06/16  Referring Provider  Lujean Amel MD      Visit Diagnosis: NSTEMI (non-ST elevated myocardial infarction) [facility])  Status post coronary artery stent placement  Patient's Home Medications on Admission:  Current Outpatient Prescriptions:  .  ALPRAZolam (XANAX) 0.25 MG tablet, Take 1 tablet (0.25 mg total) by mouth at bedtime as needed., Disp: 30 tablet, Rfl: 0 .  aspirin EC 81 MG tablet, Take 0.81 mg by mouth daily., Disp: , Rfl:  .  carvedilol (COREG) 6.25 MG tablet, Take 6.25 mg by mouth 2 (two) times daily., Disp: , Rfl:  .  clopidogrel (PLAVIX) 75 MG tablet, Take 75 mg by mouth daily., Disp: , Rfl:  .  EPINEPHrine 0.3 mg/0.3 mL IJ SOAJ injection, 0.3 mg as needed., Disp: , Rfl:  .  fexofenadine (ALLEGRA) 180 MG tablet, Take 180 mg by mouth daily., Disp: , Rfl:  .  fosfomycin (MONUROL) 3 g PACK, Take 3 g by mouth once., Disp: , Rfl:  .  Mesalamine (DELZICOL) 400 MG CPDR DR capsule, Take 6 capsules (2,400 mg total) by mouth daily., Disp: 180 capsule, Rfl: 2 .  omeprazole (PRILOSEC) 40 MG capsule, Take 40 mg by mouth daily. Reported on 04/13/2016, Disp: , Rfl:  .  ondansetron (ZOFRAN ODT) 4 MG disintegrating tablet, Take 1 tablet (4 mg total) by mouth every 8 (eight) hours as needed for nausea or vomiting., Disp: 20 tablet, Rfl: 0 .  rizatriptan (MAXALT) 5 MG tablet, USE AS DIRECTED, Disp: 10 tablet, Rfl: 0 .  rosuvastatin (CRESTOR) 5 MG tablet, Take 1 tablet (5 mg total) by mouth daily., Disp: 30 tablet, Rfl: 1 .  tobramycin-dexamethasone (TOBRADEX) ophthalmic solution, Place 1 drop into the right  eye every 6 (six) hours., Disp: 5 mL, Rfl: 0  Past Medical History: Past Medical History:  Diagnosis Date  . Allergy   . Chicken pox   . Heart murmur   . Hyperlipidemia   . Hypertension   . Migraines    hormonal, puberty  . Nephrolithiasis   . Phlebitis     Tobacco Use: History  Smoking Status  . Never Smoker  Smokeless Tobacco  . Never Used    Labs: Recent Review Flowsheet Data    Labs for ITP Cardiac and Pulmonary Rehab Latest Ref Rng & Units 09/24/2014 02/01/2015 11/07/2015 04/09/2016   Cholestrol 0 - 200 mg/dL 191 249(A) 254(H) 258(H)   LDLCALC 0 - 99 mg/dL 107(H) 168 169(H) 178(H)   HDL >39.00 mg/dL 50 49 56.50 54.20   Trlycerides 0.0 - 149.0 mg/dL 172 198(A) 142.0 131.0       Exercise Target Goals:    Exercise Program Goal: Individual exercise prescription set with THRR, safety & activity barriers. Participant demonstrates ability to understand and report RPE using BORG scale, to self-measure pulse accurately, and to acknowledge the importance of the exercise prescription.  Exercise Prescription Goal: Starting with aerobic activity 30 plus minutes a day, 3 days per week for initial exercise prescription. Provide home exercise prescription and guidelines that participant acknowledges understanding prior to discharge.  Activity Barriers & Risk Stratification:     Activity Barriers &  Cardiac Risk Stratification - 07/06/16 1409      Activity Barriers & Cardiac Risk Stratification   Activity Barriers History of Falls;Balance Concerns;Deconditioning;Muscular Weakness;Other (comment)   Comments occasional dizzy spells, r hip bone spurs      6 Minute Walk:     6 Minute Walk    Row Name 07/06/16 1404         6 Minute Walk   Phase Initial     Distance 1100 feet     Walk Time 6 minutes     # of Rest Breaks 0     MPH 2.08     METS 3.14     RPE 11     VO2 Peak 11     Symptoms No     Resting HR 78 bpm     Resting BP 128/64     Max Ex. HR 96 bpm     Max  Ex. BP 158/70     2 Minute Post BP 126/64        Initial Exercise Prescription:     Initial Exercise Prescription - 07/06/16 1400      Date of Initial Exercise RX and Referring Provider   Date 07/06/16   Referring Provider Lujean Amel MD     Treadmill   MPH 1.8   Grade 0.5   Minutes 15   METs 2.5     NuStep   Level 1   Minutes 15   METs 2     Biostep-RELP   Level 1   Minutes 15   METs 2     Prescription Details   Frequency (times per week) 3   Duration Progress to 45 minutes of aerobic exercise without signs/symptoms of physical distress     Intensity   THRR 40-80% of Max Heartrate 110-143   Ratings of Perceived Exertion 11-15   Perceived Dyspnea 0-4     Progression   Progression Continue to progress workloads to maintain intensity without signs/symptoms of physical distress.     Resistance Training   Training Prescription Yes   Weight 2 lbs   Reps 10-15      Perform Capillary Blood Glucose checks as needed.  Exercise Prescription Changes:     Exercise Prescription Changes    Row Name 07/06/16 1300 07/16/16 1000 07/29/16 1400 08/12/16 1200       Exercise Review   Progression  -  - Yes Yes      Response to Exercise   Blood Pressure (Admit) 128/84 144/68 142/82 124/70    Blood Pressure (Exercise) 158/70 162/82 138/60 134/70    Blood Pressure (Exit) 126/64 138/82 132/72 144/82    Heart Rate (Admit) 83 bpm 73 bpm 65 bpm 72 bpm    Heart Rate (Exercise) 96 bpm 97 bpm 99 bpm 90 bpm    Heart Rate (Exit) 78 bpm 75 bpm 89 bpm 68 bpm    Rating of Perceived Exertion (Exercise) _0 Duration  -  -  - Progress to 45 minutes of aerobic exercise without signs/symptoms of physical distress    Intensity  -  -  - THRR unchanged      Progression   Progression  -  -  - Continue to progress workloads to maintain intensity without signs/symptoms of physical distress.    Average METs  - 2.63 1.75 1.7      Resistance Training   Training Prescription   - Yes Yes No    Weight  -  2 2 -  doesnt do due to back  pain    Reps  - 10-15 10-15  -      Interval Training   Interval Training  - No No No      Treadmill   MPH  - 1  -  -    Grade  - 0  -  -    Minutes  - 15  -  -    METs  - 1.77  -  -      NuStep   Level  -  - 3 3    Minutes  -  - 15 15    METs  -  - 1.5 1.4      Biostep-RELP   Level  - _0 Minutes  - _1 METs  -  - 2 2       Exercise Comments:     Exercise Comments    Row Name 07/06/16 1409 07/15/16 1746 07/16/16 1033 07/24/16 1222 07/29/16 1419   Exercise Comments 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 First full day of exercise!  Madasyn was oriented to gym and equipment including functions, settings, policies, and procedures.  Nathali's individual exercise prescription and treatment plan were reviewed.  All starting workloads were established based on the results of the 6 minute walk test done at initial orientation visit.  The plan for exercise progression was also introduced and progression will be customized based on patient's performance and goals. Arthella sees her PCP today so cannot attend. She called this morning and was concerned that her back was sore aroun T8-9 where she had fractures years ago.  I advised she use less weight or no weight next time and continue to monitor her pain.  She says it was already hurting last week before she came to class. Othella has complained her heart is really racing after she leaves Cardiac Rehab. Heart rate 106 on treadmill in Cardiac Rehab only exercising at 1.11mh and 0.5% incline.I had KRhyendrink 200 cc of water. I mentioned deconditioning and KLyricatold me she "was pulling brush before her heart attack." KEvaleighsaid she feels part of it is due to her urinary tract infection that she has now. KSherahis very concerned that her heart "is racing for hours after she leaves Cardiac Rehab. Heart rate at the end of Cardiac Rehab was 86 after cool  down and relaxation. KSaiis progressing well with exercise.   Row Name 08/12/16 1302           Exercise Comments KLeshais progressing well with exercise and modifying as needed for her back.          Discharge Exercise Prescription (Final Exercise Prescription Changes):     Exercise Prescription Changes - 08/12/16 1200      Exercise Review   Progression Yes     Response to Exercise   Blood Pressure (Admit) 124/70   Blood Pressure (Exercise) 134/70   Blood Pressure (Exit) 144/82   Heart Rate (Admit) 72 bpm   Heart Rate (Exercise) 90 bpm   Heart Rate (Exit) 68 bpm   Rating of Perceived Exertion (Exercise) 12   Duration Progress to 45 minutes of aerobic exercise without signs/symptoms of physical distress   Intensity THRR unchanged     Progression   Progression Continue to progress workloads to maintain intensity without signs/symptoms of physical distress.  Average METs 1.7     Resistance Training   Training Prescription No   Weight --  doesnt do due to back  pain     Interval Training   Interval Training No     NuStep   Level 3   Minutes 15   METs 1.4     Biostep-RELP   Level 1   Minutes 15   METs 2      Nutrition:  Target Goals: Understanding of nutrition guidelines, daily intake of sodium <1572m, cholesterol <2018m calories 30% from fat and 7% or less from saturated fats, daily to have 5 or more servings of fruits and vegetables.  Biometrics:     Pre Biometrics - 07/06/16 1418      Pre Biometrics   Height 5' 3" (1.6 m)   Weight 152 lb 8 oz (69.2 kg)   Waist Circumference 33 inches   Hip Circumference 40.5 inches   Waist to Hip Ratio 0.81 %   BMI (Calculated) 27.1   Single Leg Stand 0 seconds       Nutrition Therapy Plan and Nutrition Goals:     Nutrition Therapy & Goals - 07/06/16 1442      Intervention Plan   Intervention Prescribe, educate and counsel regarding individualized specific dietary modifications aiming towards targeted  core components such as weight, hypertension, lipid management, diabetes, heart failure and other comorbidities.   Expected Outcomes Short Term Goal: Understand basic principles of dietary content, such as calories, fat, sodium, cholesterol and nutrients.;Short Term Goal: A plan has been developed with personal nutrition goals set during dietitian appointment.;Long Term Goal: Adherence to prescribed nutrition plan.      Nutrition Discharge: Rate Your Plate Scores:     Nutrition Assessments - 07/06/16 1443      Rate Your Plate Scores   Pre Score 57   Pre Score % 63 %      Nutrition Goals Re-Evaluation:   Psychosocial: Target Goals: Acknowledge presence or absence of depression, maximize coping skills, provide positive support system. Participant is able to verbalize types and ability to use techniques and skills needed for reducing stress and depression.  Initial Review & Psychosocial Screening:     Initial Psych Review & Screening - 07/06/16 1453      Family Dynamics   Concerns --  Attended couseling after daughter's death      Quality of Life Scores:     Quality of Life - 07/06/16 1438      Quality of Life Scores   Health/Function Pre 20.92 %   Socioeconomic Pre 27.92 %   Psych/Spiritual Pre 23.33 %   Family Pre 29.38 %   GLOBAL Pre 24.03 %      PHQ-9: Recent Review Flowsheet Data    [facility]   Decreased Interest 3   Down, Depressed, Hopeless 0   PHQ - 2 Score 3   Altered sleeping 0   Tired, decreased energy 3   Change in appetite 0   Feeling bad or failure about yourself  0   Trouble concentrating 0   Moving slowly or fidgety/restless 0   Suicidal thoughts 0   PHQ-9 Score 6   Difficult doing work/chores Very difficult      Psychosocial Evaluation and Intervention:     Psychosocial Evaluation - 07/15/16 1733      Psychosocial Evaluation & Interventions   Interventions Encouraged to exercise with the program and follow  exercise prescription   Comments Counselor met with  Ms. Ruppel today for initial psychosocial evaluation.  She is a 60 year old who reports "passing out" at a wedding in 07-04-23 due to a heart attack and resulted in surgery with (2) stents inserted.  She lives alone, but reports having a strong support system with sisters and a brother who live close by and "tons of friends" and church family who are involved in her life.  Ms. C states she just recovered from a UTI and has been feeling extremely weak since that time.  She sleeps "okay" with about (5) hours each night.  She reports to having been very sedentary since the heart attack.  Ms. C states her appetite is improving since the UTI.  She denies a history of depression or anxiety or any current symptoms and states her mood is typically positive.  Ms. C lost a daughter 5 years ago and a niece passed away this past 07/04/23, so she has been grieving those losses.  She has a son who just got married and lives 2 hours away which brings her "great joy."  Ms. Loletha Grayer has goals to be stronger and not dizzy as much.  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   Counselor provided supportive services and will continue to follow with Ms. C throughout the course of this program.        Psychosocial Re-Evaluation:     Psychosocial Re-Evaluation    Row Name 07/24/16 1316 08/10/16 1734           Psychosocial Re-Evaluation   Interventions Encouraged to attend Cardiac Rehabilitation for the exercise  -      Comments I called Oswaldo Done to check on her. Klaire said last night was a little better with her heart rate after Cardiac Rehab. I suggested to Aveline to drink a lot of water before she gets on the treadmill. Terricka said she finally felt able to drive for the first time in a long time today since before she felt too weak to drive due to her UTI etc. Constantina said her  54 years old daughter died several years ago. Her 71 year old daughter was hit by a truck on her way home from teaching school. Aviana reported that her 91 year old niece died of a massive heart attack a couple of weeks ago and left a 61 year old and twins 61 years old.Santiago Glad said they have a bad family history of heart disease-ie her father died of a massive heart attack and Manali reported that after the ambulance took her father to the hospital she said she her brother that she needed to take him to the hospital and she never got him there -he died of a heart attack also. [place] said she divorced her husband. 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 Follow up with Ms. Bowdish today reporting had another drug reaction last Friday and hopefully the new drug for blood pressure is going to help with decreasing her light headedness and have no negative side effects.  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  This is a huge loss for her.  Counselor discussed leaning on her friends and her  faith as ways to cope, in addition to consistent exercise.  She maintains that her mood continues to be mostly positive, but counselor is concerned about all of her losses and will continue to follow with Ms. C and provide supportive services.        Continued Psychosocial Services Needed Yes  -         Vocational Rehabilitation: Provide vocational rehab assistance to qualifying candidates.   Vocational Rehab Evaluation & Intervention:     Vocational Rehab - 07/06/16 1453      Initial Vocational Rehab Evaluation & Intervention   Assessment shows need for Vocational Rehabilitation No      Education: Education Goals: Education classes will be provided on a weekly basis, covering required topics. Participant will state  understanding/return demonstration of topics presented.  Learning Barriers/Preferences:     Learning Barriers/Preferences - 07/06/16 1452      Learning Barriers/Preferences   Learning Barriers None   Learning Preferences None      Education Topics: General Nutrition Guidelines/Fats and Fiber: -Group instruction provided by verbal, written material, models and posters to present the general guidelines for heart healthy nutrition. Gives an explanation and review of dietary fats and fiber. Flowsheet Row Cardiac Rehab from 08/17/2016 in [facility] Cardiac and Pulmonary Rehab  Date  07/27/16  Educator  P. Leonel Ramsay, [place]  Instruction Review Code  2- meets goals/outcomes      Controlling Sodium/Reading Food Labels: -Group verbal and written material supporting the discussion of sodium use in heart healthy nutrition. Review and explanation with models, verbal and written materials for utilization of the food label.   Exercise Physiology & Risk Factors: - Group verbal and written instruction with models to review the exercise physiology of the cardiovascular system and associated critical values. Details cardiovascular disease risk factors and the goals associated with each risk factor. Flowsheet Row Cardiac Rehab from 08/17/2016 in [facility] Cardiac and Pulmonary Rehab  Date  08/10/16  Educator  [facility]  Instruction Review Code  2- meets goals/outcomes      Aerobic Exercise & Resistance Training: - Gives group verbal and written discussion on the health impact of inactivity. On the components of aerobic and resistive training programs and the benefits of this training and how to safely progress through these programs. Flowsheet Row Cardiac Rehab from 08/17/2016 in [facility] Cardiac and Pulmonary Rehab  Date  08/12/16  Educator  AS  Instruction Review Code  2- meets goals/outcomes      Flexibility, Balance, General Exercise Guidelines: - Provides group verbal and written instruction on the benefits of  flexibility and balance training programs. Provides general exercise guidelines with specific guidelines to those with heart or lung disease. Demonstration and skill practice provided. Flowsheet Row Cardiac Rehab from 08/17/2016 in [facility] Cardiac and Pulmonary Rehab  Date  08/17/16  Educator  [facility]  Instruction Review Code  2- meets goals/outcomes      Stress Management: - Provides group verbal and written instruction about the health risks of elevated stress, cause of high stress, and healthy ways to reduce stress.   Depression: - Provides group verbal and written instruction on the correlation between heart/lung disease and depressed mood, treatment options, and the stigmas associated with seeking treatment. Flowsheet Row Cardiac Rehab [facility] 08/17/2016 [facility] Cardiac [facility] Re[facility]  Date  07/29/16  Educator  [facility]  Instruction Review Code  2- meets goals/outcomes      Anatomy & Physiology of the Heart: - Group verbal and written instruction and models  provide basic cardiac anatomy and physiology, with the coronary electrical and arterial systems. Review of: AMI, Angina, Valve disease, Heart Failure, Cardiac Arrhythmia, Pacemakers, and the ICD.   Cardiac Procedures: - Group verbal and written instruction and models to describe the testing methods done to diagnose heart disease. Reviews the outcomes of the test results. Describes the treatment choices: Medical Management, Angioplasty, or Coronary Bypass Surgery.   Cardiac Medications: - Group verbal and written instruction to review commonly prescribed medications for heart disease. Reviews the medication, class of the drug, and side effects. Includes the steps to properly store meds and maintain the prescription regimen. Flowsheet Row Cardiac Rehab from 08/17/2016 in [facility] Cardiac and Pulmonary Rehab  Date  07/15/16  Educator  SB  Instruction Review Code  2- meets goals/outcomes      Go Sex-Intimacy & Heart Disease, Get SMART - Goal  Setting: - Group verbal and written instruction through game format to discuss heart disease and the return to sexual intimacy. Provides group verbal and written material to discuss and apply goal setting through the application of the S.M.A.R.T. Method.   Other Matters of the Heart: - Provides group verbal, written materials and models to describe Heart Failure, Angina, Valve Disease, and Diabetes in the realm of heart disease. Includes description of the disease process and treatment options available to the cardiac patient.   Exercise & Equipment Safety: - Individual verbal instruction and demonstration of equipment use and safety with use of the equipment. Flowsheet Row Cardiac Rehab from 08/17/2016 in [facility] Cardiac and Pulmonary Rehab  Date  07/06/16  Educator   SB  Instruction Review Code  2- meets goals/outcomes      Infection Prevention: - Provides verbal and written material to individual with discussion of infection control including proper hand washing and proper equipment cleaning during exercise session. Flowsheet Row Cardiac Rehab from 08/17/2016 in [facility] Cardiac and Pulmonary Rehab  Date  07/06/16  Educator  SB  Instruction Review Code  2- meets goals/outcomes      Falls Prevention: - Provides verbal and written material to individual with discussion of falls prevention and safety. Flowsheet Row Cardiac Rehab from 08/17/2016 in [facility] Cardiac and Pulmonary Rehab  Date  07/06/16  Educator  SB  Instruction Review Code  2- meets goals/outcomes      Diabetes: - Individual verbal and written instruction to review signs/symptoms of diabetes, desired ranges of glucose level fasting, after meals and with exercise. Advice that pre and post exercise glucose checks will be done for 3 sessions at entry of program.    Knowledge Questionnaire Score:     Knowledge Questionnaire Score - 07/06/16 1452      Knowledge Questionnaire Score   Pre Score 23/28      Core  Components/Risk Factors/Patient Goals at Admission:     Personal Goals and Risk Factors at Admission - 07/06/16 1449      Core Components/Risk Factors/Patient Goals on Admission   Sedentary Yes   Intervention Provide advice, education, support and counseling about physical activity/exercise needs.;Develop an individualized exercise prescription for aerobic and resistive training based on initial evaluation findings, risk stratification, comorbidities and participant's personal goals.   Expected Outcomes Achievement of increased cardiorespiratory fitness and enhanced flexibility, muscular endurance and strength shown through measurements of functional capacity and personal statement of participant.   Increase Strength and Stamina Yes   Intervention Provide advice, education, support and counseling about physical activity/exercise needs.;Develop an individualized exercise prescription for aerobic and resistive training based  on initial evaluation findings, risk stratification, comorbidities and participant's personal goals.   Expected Outcomes Achievement of increased cardiorespiratory fitness and enhanced flexibility, muscular endurance and strength shown through measurements of functional capacity and personal statement of participant.   Hypertension Yes   Intervention Provide education on lifestyle modifcations including regular physical activity/exercise, weight management, moderate sodium restriction and increased consumption of fresh fruit, vegetables, and low fat dairy, alcohol moderation, and smoking cessation.;Monitor prescription use compliance.   Expected Outcomes Short Term: Continued assessment and intervention until BP is < 140/86m HG in hypertensive participants. < 130/853mHG in hypertensive participants with diabetes, heart failure or chronic kidney disease.;Long Term: Maintenance of blood pressure at goal levels.   Lipids Yes   Intervention Provide education and support for  participant on nutrition & aerobic/resistive exercise along with prescribed medications to achieve LDL <7067mHDL >70m105m Expected Outcomes Short Term: Participant states understanding of desired cholesterol values and is compliant with medications prescribed. Participant is following exercise prescription and nutrition guidelines.;Long Term: Cholesterol controlled with medications as prescribed, with individualized exercise RX and with personalized nutrition plan. Value goals: LDL < 70mg39mL > 40 mg.      Core Components/Risk Factors/Patient Goals Review:      Goals and Risk Factor Review    Row Name 07/24/16 1222 08/12/16 1736           Core Components/Risk Factors/Patient Goals Review   Personal Goals Review  - Sedentary;Increase Strength and Stamina;Hypertension;Lipids;Stress;Weight Management/Obesity      Review KarenAquitacomplained her heart is really racing after she leaves Cardiac Rehab. Heart rate 106 on treadmill in Cardiac Rehab only exercising at 1.8mph 8m 0.5% incline.I had Zeeva Patte 200 cc of water. I mentioned deconditioning and Salley Larisame she "was pulling brush before her heart attack." Margarette Ronnikashe feels part of it is due to her urinary tract infection that she has now. Azra Severary concerned that her heart "is racing for hours after she leaves Cardiac Rehab. Heart rate at the end of Cardiac Rehab was 86 after cool down and relaxation. Lian's weight has started to creep up as she is taking her rides to rehab out to dinner.  She is still having issues with her UTI, Chron's, and bad drug reactions.  She is still coping with a lot of stress and recently was told that they would not be holding her job after 10/26.  Will let counselor know so that she can follow up as well.  She has noted that exercise is starting to help her feel better as she is able to do more at home than previously.  She is still concerned that she cannot do as much as before, but reasssured that she is  still in the recovery phase and fighting other infections.  She has not had her labs tested recently.  Her blood pressure was good today, but she continues to have episodes of low pressure and is wearing an event monitor.      Expected Outcomes Quality exercise/conditioned. Bevan Nateishacontinue to come to exercise and education classes to work on risk factor modification and for support.  We will continue to monitor.         Core Components/Risk Factors/Patient Goals at Discharge (Final Review):      Goals and Risk Factor Review - 08/12/16 1736      Core Components/Risk Factors/Patient Goals Review   Personal Goals Review Sedentary;Increase Strength and Stamina;Hypertension;Lipids;Stress;Weight Management/Obesity   Review Renee's  weight has started to creep up as she is taking her rides to rehab out to dinner.  She is still having issues with her UTI, Chron's, and bad drug reactions.  She is still coping with a lot of stress and recently was told that they would not be holding her job after 10/26.  Will let counselor know so that she can follow up as well.  She has noted that exercise is starting to help her feel better as she is able to do more at home than previously.  She is still concerned that she cannot do as much as before, but reasssured that she is still in the recovery phase and fighting other infections.  She has not had her labs tested recently.  Her blood pressure was good today, but she continues to have episodes of low pressure and is wearing an event monitor.   Expected Outcomes Melodye will continue to come to exercise and education classes to work on risk factor modification and for support.  We will continue to monitor.      ITP Comments:     ITP Comments    Row Name 07/06/16 1438 07/15/16 1746 07/16/16 1035 07/22/16 0654 07/23/16 1648   ITP Comments Initial ITP created during [facility] Review. Diagnosis documentation can be found [facility] encounter 06/16/2016 First full  day of exercise!  Rateel was oriented to gym and equipment including functions, settings, policies, and procedures.  Shaylon's individual exercise prescription and treatment plan were reviewed.  All starting workloads were established based on the results of the 6 minute walk test done at initial orientation visit.  The plan for exercise progression was also introduced and progression will be customized based on patient's performance and goals. Anastacia sees her PCP today so cannot attend. She called this morning and was concerned that her back was sore aroun T8-9 where she had fractures years ago.  I advised she use less weight or no weight next time and continue to monitor her pain.  She says it was already hurting last week before she came to class. 30 day review. Continue with ITP unless changes noted by Medical Director at signature of review.  Has started program this week Savana instructed to contact her doctor's office since she c/o of her heart rate going fast in the evening when she leaves here. Jashiya reported she is still on 1634m Verapamil. KMylekareported that tha MD increased her verapamil to 1654mbut decreased it when she c/o being tired. KaChalicen treadmill at 1.34m69mheart rate 100 even after drinking  240 cc water.    RowTawas Cityme 07/24/16 1222 07/24/16 1315 07/31/16 1219 07/31/16 1220 08/03/16 1628   ITP Comments I sent a note via [facility] to Dr. CalClayborn Bignessday-Markala has complained her heart is really racing after she leaves Cardiac Rehab. Heart rate 106 on treadmill in Cardiac Rehab only exercising at 1.8mp74mnd 0.5% incline.I had KareRoseank 200 cc of water. I mentioned deconditioning and KareAmelyad me she "was pulling brush before her heart attack." KareAmeyd she feels part of it is due to her urinary tract infection that she has now. KareShaundravery concerned that her heart "is racing for hours after she leaves Cardiac Rehab. Heart rate at the end of Cardiac Rehab was 86 after cool down and relaxation. I  called KareKorynn Kenedycheck on her. KareShifad last night was a little better with her heart rate after Cardiac Rehab. I suggested to KareZenobia  drink a lot of water before she gets on the treadmill. Tomiko said she finally felt able to drive for the first time in a long time today since before she felt too weak to drive due to her UTI etc. Claudine said her 56 years old daughter died several years ago. Her 65 year old daughter was hit by a truck on her way home from teaching school. Janielle reported that her 36 year old niece died of a massive heart attack a couple of weeks ago and left a 61 year old and twins 61 years old.Santiago Glad said they have a bad family history of heart disease-ie her father died of a massive heart attack and Sarahlynn reported that after the ambulance took her father to the hospital she said she her brother that she needed to take him to the hospital and she never got him there -he died of a heart attack also. Fiana said she divorced her husband. 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 Meliss left a vm on Cardiac Rehab phone and said that she did drive her on WEd 7/32 for Cardiac REhab but she felt so weak that she didn't think she should do Cardiac Rehab. Timberly is still struggling with feeling weak which keeps her for exercising.  Maryfrances called to tell us she is at [facility] for her recurrent  UTI.  She also stated that she is being evaluated for a leaking valve.    Row Name 08/17/16 1825 08/19/16 0651         ITP Comments Charnise drove to class for the first time today. She is feeling better, now that the UTI she has had over a month is better.  30 day review. Continue with ITP unless changes noted by Medical Director at signature of review. HAs been out medical concerns. Returning feeling better.         Comments:

## 2016-08-24 ENCOUNTER — Encounter: Payer: BC Managed Care – PPO | Admitting: *Deleted

## 2016-08-24 DIAGNOSIS — Z955 Presence of coronary angioplasty implant and graft: Secondary | ICD-10-CM

## 2016-08-24 DIAGNOSIS — I214 Non-ST elevation (NSTEMI) myocardial infarction: Secondary | ICD-10-CM

## 2016-08-24 DIAGNOSIS — Z9861 Coronary angioplasty status: Secondary | ICD-10-CM | POA: Diagnosis not present

## 2016-08-24 NOTE — Progress Notes (Signed)
Daily Session Note  Patient Details  Name: Morgan Moore MRN: [id number] Date of Birth: 02-01-1956 Referring Provider:   Flowsheet Row Cardiac Rehab from 07/06/2016 in [facility] Cardiac and Pulmonary Rehab  Referring Provider  Lujean Amel MD      Encounter Date: 08/24/2016  Check In:     Session Check In - 08/24/16 1632      Check-In   Location [facility]-Cardiac & Pulmonary Rehab   Staff Present Gerlene Burdock, RN, BSN;Susanne Bice, RN, BSN, Laveda Norman, BS, ACSM CEP, Exercise Physiologist   Supervising physician immediately available to respond to emergencies See telemetry face sheet for immediately available ER MD   Medication changes reported     No   Fall or balance concerns reported    No   Warm-up and Cool-down Performed on first and last piece of equipment   Resistance Training Performed Yes   VAD Patient? No     VAD patient   Has back up controller? No     Pain Assessment   Currently in Pain? No/denies   Multiple Pain Sites No         Goals Met:  Independence with exercise equipment Exercise tolerated well No report of cardiac concerns or symptoms Strength training completed today  Goals Unmet:  Not Applicable  Comments: Pt able to follow exercise prescription today without complaint.  Will continue to monitor for progression.    Dr. Emily Filbert is Medical Director for [place] and [facility].

## 2016-08-26 DIAGNOSIS — Z955 Presence of coronary angioplasty implant and graft: Secondary | ICD-10-CM

## 2016-08-26 DIAGNOSIS — Z9861 Coronary angioplasty status: Secondary | ICD-10-CM | POA: Diagnosis not present

## 2016-08-26 DIAGNOSIS — I214 Non-ST elevation (NSTEMI) myocardial infarction: Secondary | ICD-10-CM

## 2016-08-26 NOTE — Progress Notes (Signed)
Daily Session Note  Patient Details  Name: Morgan Moore MRN: [id number] Date of Birth: [birth year] Referring Provider:   Flowsheet Row Cardiac Rehab from 07/06/2016 in [facility] Cardiac and Pulmonary Rehab  Referring Provider  Lujean Amel MD      Encounter Date: 08/26/2016  Check In:     Session Check In - 08/26/16 1630      Check-In   Location [facility]-Cardiac & Pulmonary Rehab   Staff Present Heath Lark, RN, BSN, CCRP;Carroll Enterkin, RN, Vickki Hearing, BA, ACSM CEP, Exercise Physiologist   Supervising physician immediately available to respond to emergencies See telemetry face sheet for immediately available ER MD   Medication changes reported     No   Fall or balance concerns reported    No   Warm-up and Cool-down Performed on first and last piece of equipment   Resistance Training Performed Yes   VAD Patient? No     VAD patient   Has back up controller? No     Pain Assessment   Currently in Pain? No/denies         Goals Met:  Independence with exercise equipment Exercise tolerated well No report of cardiac concerns or symptoms Strength training completed today  Goals Unmet:  Not Applicable  Comments: Pt able to follow exercise prescription today without complaint.  Will continue to monitor for progression. ]   Dr. Emily Filbert is Medical Director for [place] and [facility].

## 2016-08-31 ENCOUNTER — Encounter: Payer: BC Managed Care – PPO | Admitting: *Deleted

## 2016-08-31 DIAGNOSIS — Z955 Presence of coronary angioplasty implant and graft: Secondary | ICD-10-CM

## 2016-08-31 DIAGNOSIS — I214 Non-ST elevation (NSTEMI) myocardial infarction: Secondary | ICD-10-CM

## 2016-08-31 DIAGNOSIS — Z9861 Coronary angioplasty status: Secondary | ICD-10-CM | POA: Diagnosis not present

## 2016-08-31 NOTE — Progress Notes (Signed)
Daily Session Note  Patient Details  Name: Morgan Moore MRN: [id number] Date of Birth: 07/21/1956 Referring Provider:   Flowsheet Row Cardiac Rehab from 07/06/2016 in [facility] Cardiac and Pulmonary Rehab  Referring Provider  Callwood, Dwayne MD      Encounter Date: 08/31/2016  Check In:     Session Check In - 08/31/16 1630      Check-In   Location [facility] Re[facility]   Staff Present  , BS, ACSM CEP, Exercise Physiologist;Carroll Enterkin, RN, BSN;Mary Jo Abernethy, RN, BSN, MA   Supervising physician immediately available to respond to emergencies See telemetry face sheet for immediately available ER MD   Medication changes reported     No   Fall or balance concerns reported    No   Warm-up and Cool-down Performed on first and last piece of equipment   Resistance Training Performed Yes   VAD Patient? No     VAD patient   Has back up controller? No     Pain Assessment   Currently in Pain? No/denies   Multiple Pain Sites No         Goals Met:  Independence with exercise equipment Exercise tolerated well No report of cardiac concerns or symptoms Strength training completed today  Goals Unmet:  Not Applicable  Comments: Pt able to follow exercise prescription today without complaint.  Will continue to monitor for progression.    Dr. Mark Miller is Medical Director for [facility] and [facility]. 

## 2016-09-02 ENCOUNTER — Encounter: Payer: BC Managed Care – PPO | Admitting: *Deleted

## 2016-09-02 DIAGNOSIS — I214 Non-ST elevation (NSTEMI) myocardial infarction: Secondary | ICD-10-CM

## 2016-09-02 DIAGNOSIS — Z9861 Coronary angioplasty status: Secondary | ICD-10-CM | POA: Diagnosis not present

## 2016-09-02 NOTE — Progress Notes (Signed)
Daily Session Note  Patient Details  Name: Morgan Moore MRN: [id number] Date of Birth: 12/05/1955 Referring Provider:   Flowsheet Row Cardiac Rehab from 07/06/2016 in [facility] Cardiac and Pulmonary Rehab  Referring Provider  Callwood, Dwayne MD      Encounter Date: 09/02/2016  Check In:     Session Check In - 09/02/16 1651      Check-In   Location [facility]   Staff Present  , RN, BSN;Susanne Bice, RN, BSN, CCRP;Amanda Sommer, BA, ACSM CEP, Exercise Physiologist   Supervising physician immediately available to respond to emergencies See telemetry face sheet for immediately available ER MD   Medication changes reported     No   Fall or balance concerns reported    No   Warm-up and Cool-down Performed on first and last piece of equipment   Resistance Training Performed Yes     VAD patient   Has back up controller? No         Goals Met:  Proper associated with RPD/PD & O2 Sat Exercise tolerated well  Goals Unmet:  Not Applicable  Comments:     Dr. Mark Miller is Medical Director for [facility] and [facility]. 

## 2016-09-05 IMAGING — MG MM DIGITAL SCREENING BILAT W/ CAD
1 series · 4 of 4 positions shown · non-contrast
Comparison: Previous exam(s).

CLINICAL DATA: Screening.

EXAM:
DIGITAL SCREENING BILATERAL MAMMOGRAM WITH CAD

[R CC · right · 4 of 4 slices shown]
[im 1/4]
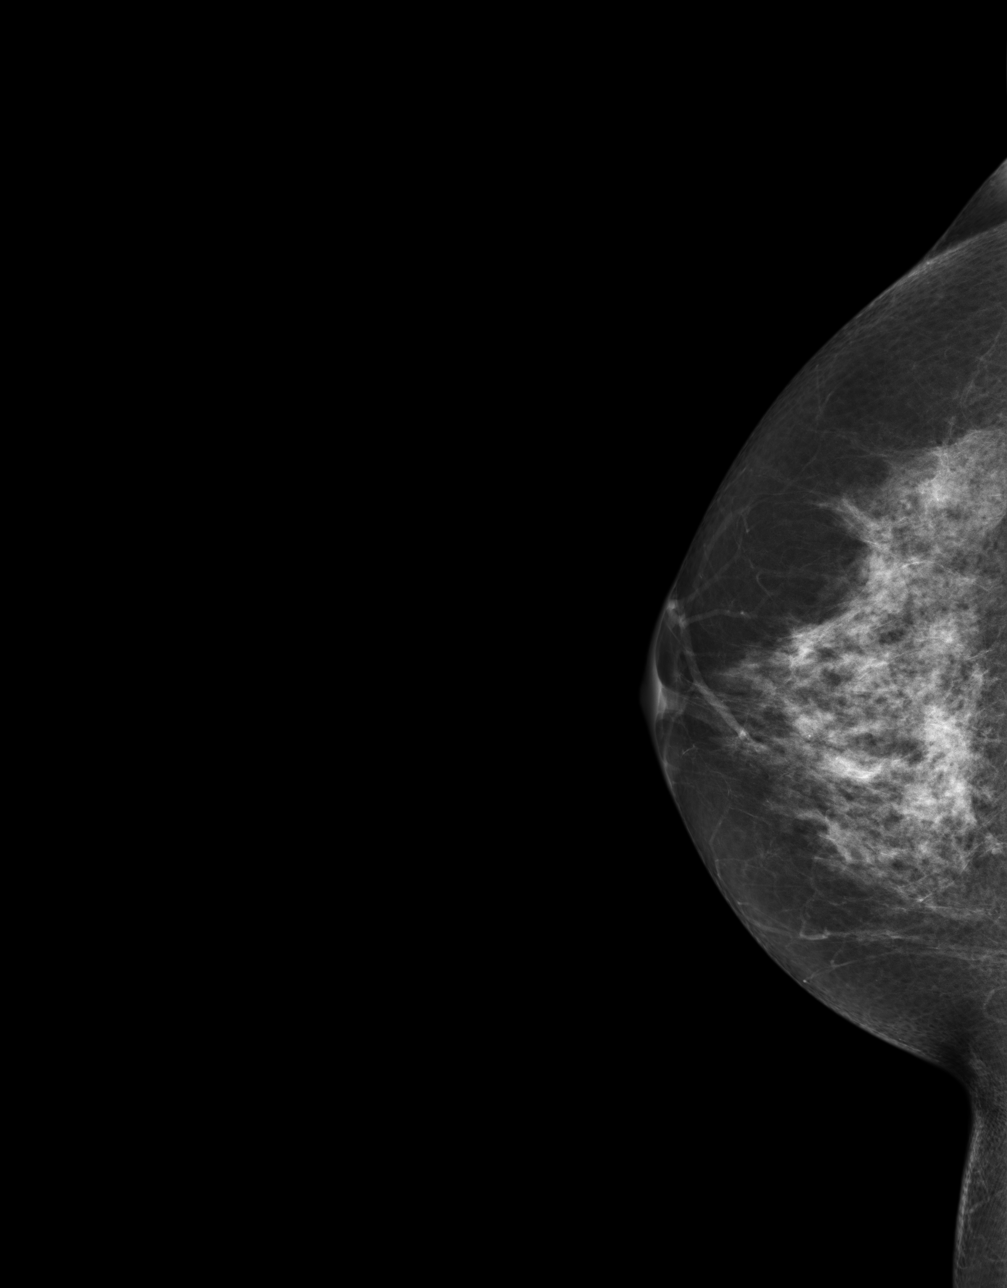
[im 2/4]
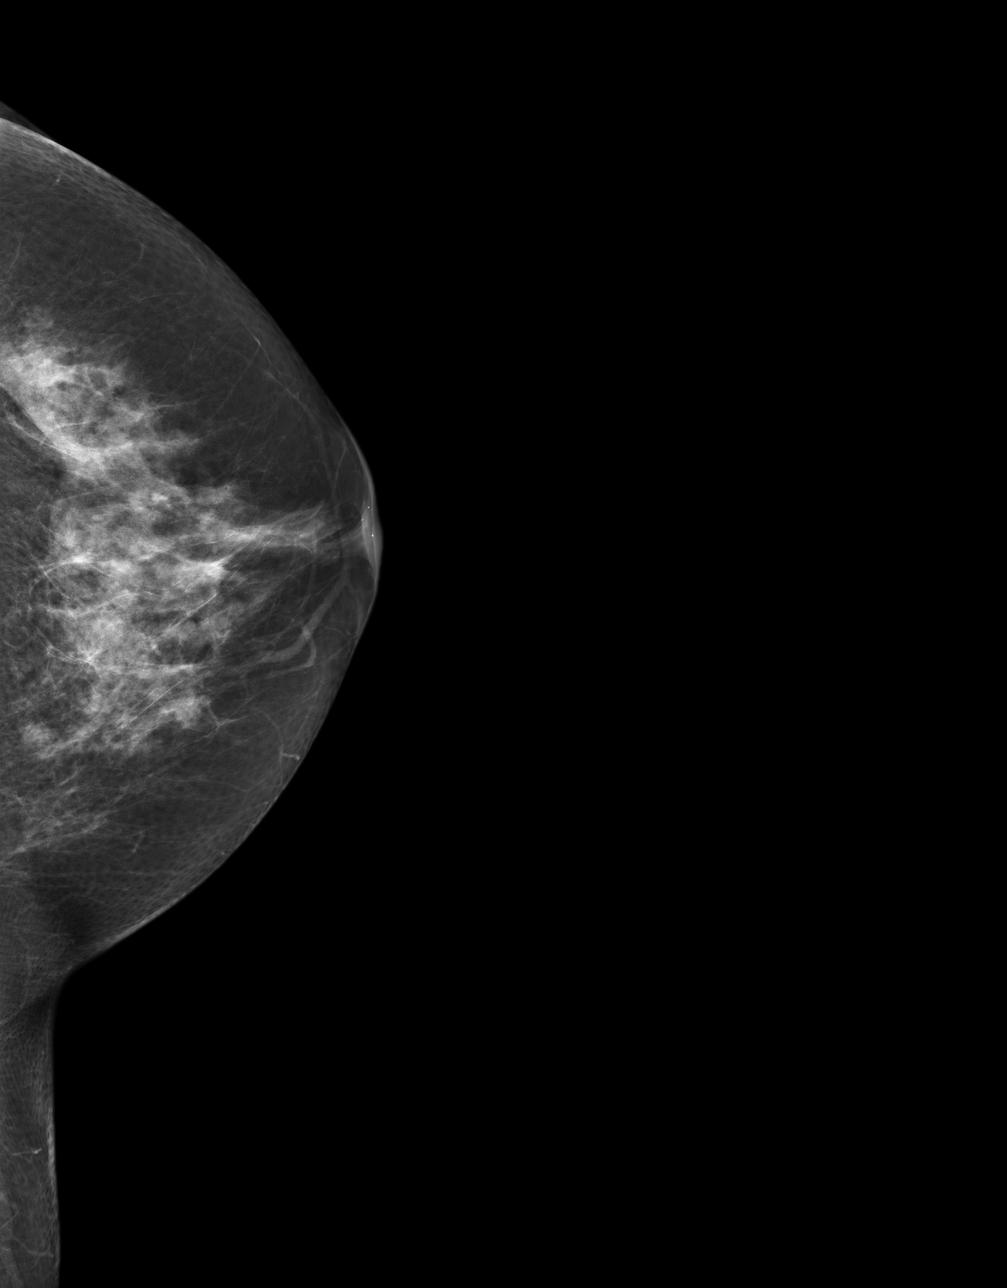
[im 3/4]
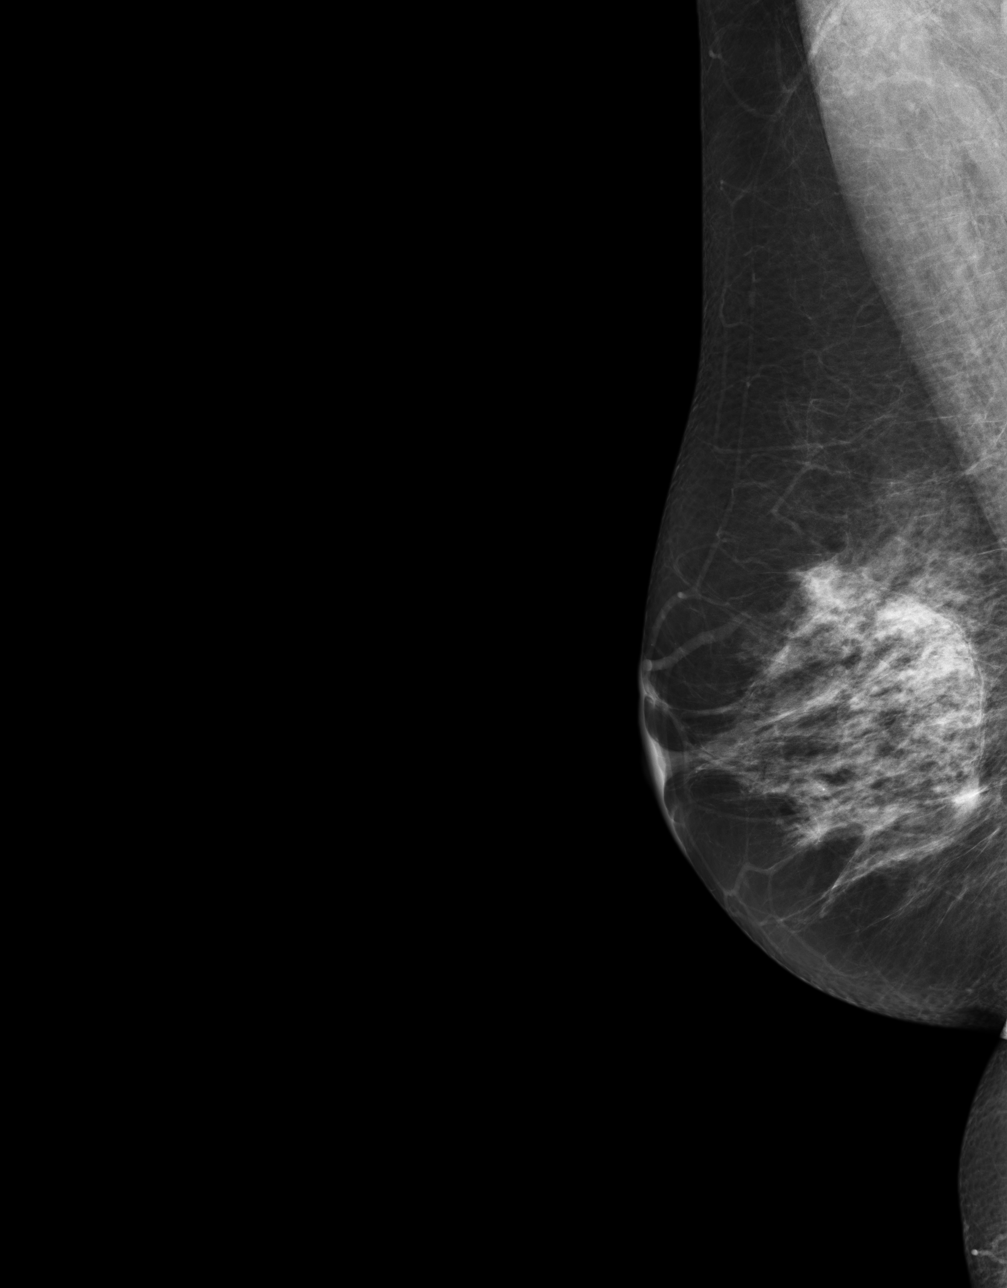
[im 4/4]
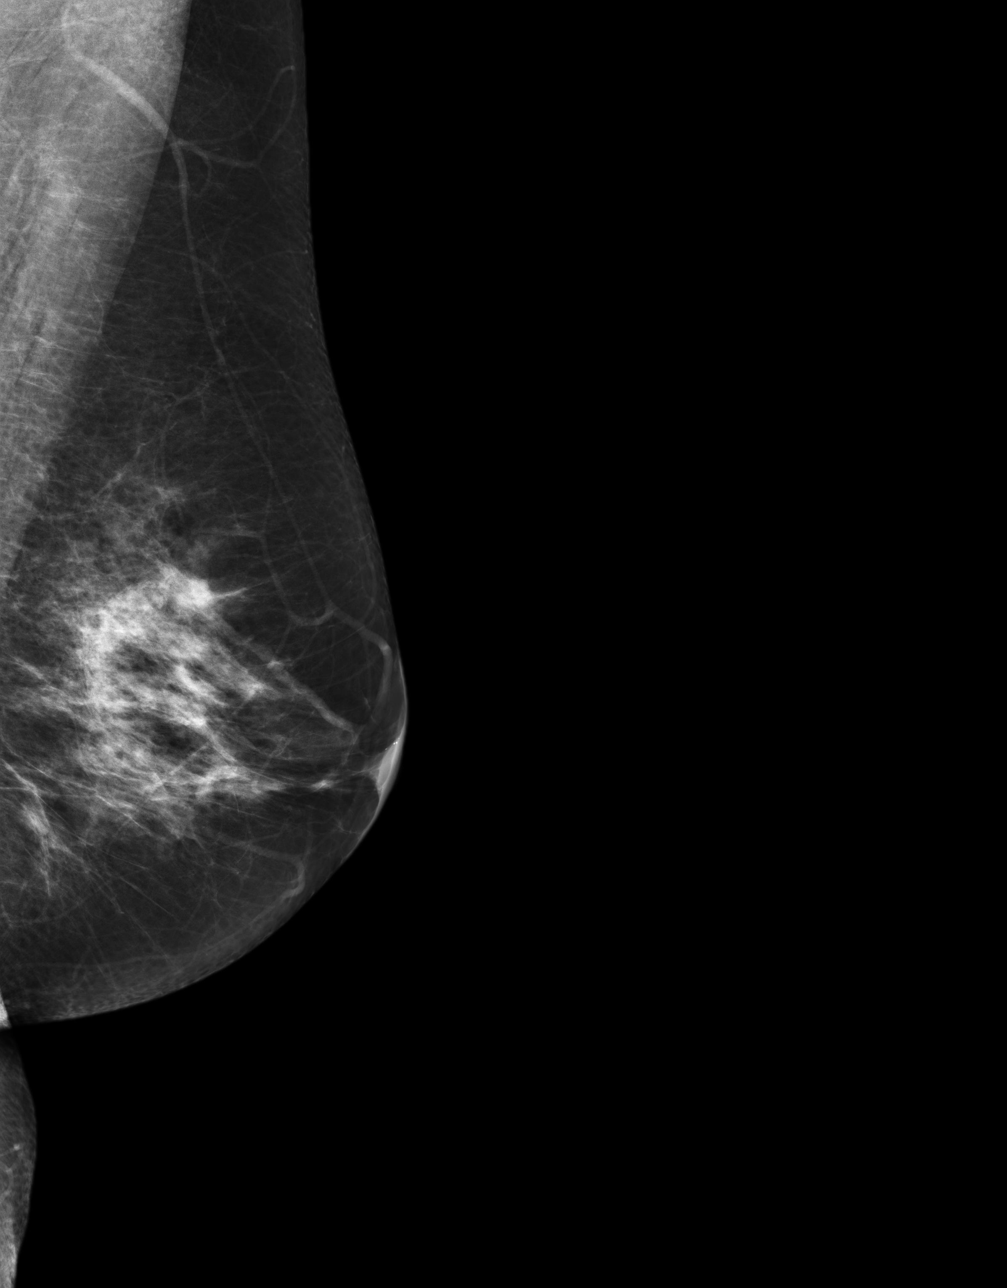

[4 of 4 positions shown; findings below may reference images not displayed]

ACR Breast Density Category c: The breast tissue is heterogeneously
dense, which may obscure small masses.
FINDINGS: There are no findings suspicious for malignancy. Images were
processed with CAD.
IMPRESSION: No mammographic evidence of malignancy. A result letter of this
screening mammogram will be mailed directly to the patient.

RECOMMENDATION:
Screening mammogram in one year. (Code:YJ-[id number])

BI-RADS CATEGORY  1: Negative.

## 2016-09-07 ENCOUNTER — Encounter: Payer: BC Managed Care – PPO | Admitting: *Deleted

## 2016-09-07 DIAGNOSIS — I214 Non-ST elevation (NSTEMI) myocardial infarction: Secondary | ICD-10-CM

## 2016-09-07 DIAGNOSIS — Z955 Presence of coronary angioplasty implant and graft: Secondary | ICD-10-CM

## 2016-09-07 DIAGNOSIS — Z9861 Coronary angioplasty status: Secondary | ICD-10-CM | POA: Diagnosis not present

## 2016-09-07 NOTE — Progress Notes (Signed)
Daily Session Note  Patient Details  Name: Morgan Moore MRN: [id number] Date of Birth: 1955/11/15 Referring Provider:   Flowsheet Row Cardiac Rehab from 07/06/2016 in [facility] Cardiac and Pulmonary Rehab  Referring Provider  Lujean Amel MD      Encounter Date: 09/07/2016  Check In:     Session Check In - 09/07/16 1734      Check-In   Staff Present Heath Lark, RN, BSN, CCRP;Carroll Enterkin, RN, Moises Blood, BS, ACSM CEP, Exercise Physiologist   Supervising physician immediately available to respond to emergencies See telemetry face sheet for immediately available ER MD   Medication changes reported     No   Fall or balance concerns reported    No   Warm-up and Cool-down Performed on first and last piece of equipment   Resistance Training Performed Yes   VAD Patient? No     Pain Assessment   Currently in Pain? No/denies         Goals Met:  Independence with exercise equipment Exercise tolerated well No report of cardiac concerns or symptoms Strength training completed today  Goals Unmet:  Not Applicable  Comments: Doing well with exercise prescription progression.    Dr. Emily Filbert is Medical Director for [place] and [facility].

## 2016-09-09 ENCOUNTER — Encounter: Payer: BC Managed Care – PPO | Attending: Internal Medicine

## 2016-09-09 DIAGNOSIS — I213 ST elevation (STEMI) myocardial infarction of unspecified site: Secondary | ICD-10-CM | POA: Diagnosis present

## 2016-09-09 DIAGNOSIS — I214 Non-ST elevation (NSTEMI) myocardial infarction: Secondary | ICD-10-CM

## 2016-09-09 DIAGNOSIS — Z9861 Coronary angioplasty status: Secondary | ICD-10-CM | POA: Diagnosis present

## 2016-09-09 DIAGNOSIS — Z955 Presence of coronary angioplasty implant and graft: Secondary | ICD-10-CM

## 2016-09-09 NOTE — Progress Notes (Signed)
Daily Session Note  Patient Details  Name: Morgan Moore MRN: [id number] Date of Birth: [birth year] Referring Provider:   Flowsheet Row Cardiac Rehab from 07/06/2016 in [facility] Cardiac and Pulmonary Rehab  Referring Provider  Lujean Amel MD      Encounter Date: 09/09/2016  Check In:     Session Check In - 09/09/16 1744      Check-In   Location [facility]iac & [facility] Rehab   Staff Present Other;Rebecca Brayton El, DPT, Burlene Arnt, BA, ACSM CEP, Exercise Physiologist   Supervising physician immediately available to respond to emergencies See telemetry face sheet for immediately available ER MD   Medication changes reported     No   Fall or balance concerns reported    No   Warm-up and Cool-down Performed on first and last piece of equipment   Resistance Training Performed Yes   VAD Patient? No     VAD patient   Has back up controller? No     Pain Assessment   Currently in Pain? No/denies           Exercise Prescription Changes - 09/09/16 0900      Exercise Review   Progression Yes     Response to Exercise   Blood Pressure (Exercise) 156/80   Blood Pressure (Exit) 148/80   Heart Rate (Admit) 76 bpm   Heart Rate (Exercise) 102 bpm   Heart Rate (Exit) 68 bpm   Rating of Perceived Exertion (Exercise) 12   Duration Progress to 45 minutes of aerobic exercise without signs/symptoms of physical distress   Intensity THRR unchanged     Progression   Progression Continue to progress workloads to maintain intensity without signs/symptoms of physical distress.   Average METs 2.2     Resistance Training   Training Prescription No     Interval Training   Interval Training No     NuStep   Level 3   Minutes 15   METs 2.2      Goals Met:  Independence with exercise equipment Exercise tolerated well No report of cardiac concerns or symptoms Strength training completed today  Goals Unmet:  Not Applicable  Comments: Pt able to follow exercise  prescription today without complaint.  Will continue to monitor for progression.    Dr. Emily Filbert is Medical Director for [place] and [facility].

## 2016-09-14 ENCOUNTER — Encounter: Payer: BC Managed Care – PPO | Admitting: *Deleted

## 2016-09-14 DIAGNOSIS — I214 Non-ST elevation (NSTEMI) myocardial infarction: Secondary | ICD-10-CM

## 2016-09-14 DIAGNOSIS — Z9861 Coronary angioplasty status: Secondary | ICD-10-CM | POA: Diagnosis not present

## 2016-09-16 ENCOUNTER — Encounter: Payer: Self-pay | Admitting: *Deleted

## 2016-09-16 DIAGNOSIS — Z955 Presence of coronary angioplasty implant and graft: Secondary | ICD-10-CM

## 2016-09-16 DIAGNOSIS — I214 Non-ST elevation (NSTEMI) myocardial infarction: Secondary | ICD-10-CM

## 2016-09-16 DIAGNOSIS — Z9861 Coronary angioplasty status: Secondary | ICD-10-CM | POA: Diagnosis not present

## 2016-09-16 NOTE — Progress Notes (Signed)
Daily Session Note  Patient Details  Name: Morgan Moore MRN: [id number] Date of Birth: 1955/11/14 Referring Provider:   Flowsheet Row Cardiac Rehab from 07/06/2016 in [facility] Cardiac and Pulmonary Rehab  Referring Provider  Lujean Amel MD      Encounter Date: 09/16/2016  Check In:     Session Check In - 09/16/16 1727      Check-In   Location [facility]-Cardiac & Pulmonary Rehab   Staff Present Gerlene Burdock, RN, BSN;Shunta Mclaurin, DPT, Burlene Arnt, BA, ACSM CEP, Exercise Physiologist   Supervising physician immediately available to respond to emergencies See telemetry face sheet for immediately available ER MD   Medication changes reported     No   Fall or balance concerns reported    No   Warm-up and Cool-down Performed on first and last piece of equipment   Resistance Training Performed Yes   VAD Patient? No     VAD patient   Has back up controller? No     Pain Assessment   Currently in Pain? No/denies   Multiple Pain Sites No         Goals Met:  Independence with exercise equipment  Goals Unmet:  Not Applicable  Comments: Patient completed exercise prescription and all exercise goals during rehab session. The exercise was tolerated well and the patient is progressing in the program.    Dr. Emily Filbert is Medical Director for [place] and [facility].

## 2016-09-16 NOTE — Progress Notes (Signed)
Cardiac Individual Treatment Plan  Patient Details  Name: Elida Harbin MRN: [id number] Date of Birth: 09-Oct-1956 Referring Provider:   Flowsheet Row Cardiac Rehab from 07/06/2016 in [facility] Cardiac and Pulmonary Rehab  Referring Provider  Lujean Amel MD      Initial Encounter Date:  Flowsheet Row Cardiac Rehab from 07/06/2016 in [facility] Cardiac and Pulmonary Rehab  Date  07/06/16  Referring Provider  Lujean Amel MD      Visit Diagnosis: NSTEMI (non-ST elevated myocardial infarction) [facility])  Status post coronary artery stent placement  Patient's Home Medications on Admission:  Current Outpatient Prescriptions:  .  ALPRAZolam (XANAX) 0.25 MG tablet, Take 1 tablet (0.25 mg total) by mouth at bedtime as needed., Disp: 30 tablet, Rfl: 0 .  aspirin EC 81 MG tablet, Take 0.81 mg by mouth daily., Disp: , Rfl:  .  carvedilol (COREG) 6.25 MG tablet, Take 6.25 mg by mouth 2 (two) times daily., Disp: , Rfl:  .  clopidogrel (PLAVIX) 75 MG tablet, Take 75 mg by mouth daily., Disp: , Rfl:  .  EPINEPHrine 0.3 mg/0.3 mL IJ SOAJ injection, 0.3 mg as needed., Disp: , Rfl:  .  fexofenadine (ALLEGRA) 180 MG tablet, Take 180 mg by mouth daily., Disp: , Rfl:  .  fosfomycin (MONUROL) 3 g PACK, Take 3 g by mouth once., Disp: , Rfl:  .  Mesalamine (DELZICOL) 400 MG CPDR DR capsule, Take 6 capsules (2,400 mg total) by mouth daily., Disp: 180 capsule, Rfl: 2 .  omeprazole (PRILOSEC) 40 MG capsule, Take 40 mg by mouth daily. Reported on 04/13/2016, Disp: , Rfl:  .  ondansetron (ZOFRAN ODT) 4 MG disintegrating tablet, Take 1 tablet (4 mg total) by mouth every 8 (eight) hours as needed for nausea or vomiting., Disp: 20 tablet, Rfl: 0 .  rizatriptan (MAXALT) 5 MG tablet, USE AS DIRECTED, Disp: 10 tablet, Rfl: 0 .  rosuvastatin (CRESTOR) 5 MG tablet, Take 1 tablet (5 mg total) by mouth daily., Disp: 30 tablet, Rfl: 1 .  tobramycin-dexamethasone (TOBRADEX) ophthalmic solution, Place 1 drop into the right  eye every 6 (six) hours., Disp: 5 mL, Rfl: 0  Past Medical History: Past Medical History:  Diagnosis Date  . Allergy   . Chicken pox   . Heart murmur   . Hyperlipidemia   . Hypertension   . Migraines    hormonal, puberty  . Nephrolithiasis   . Phlebitis     Tobacco Use: History  Smoking Status  . Never Smoker  Smokeless Tobacco  . Never Used    Labs: Recent Review Flowsheet Data    Labs for ITP Cardiac and Pulmonary Rehab Latest Ref Rng & Units 09/24/2014 02/01/2015 11/07/2015 04/09/2016   Cholestrol 0 - 200 mg/dL 191 249(A) 254(H) 258(H)   LDLCALC 0 - 99 mg/dL 107(H) 168 169(H) 178(H)   HDL >39.00 mg/dL 50 49 56.50 54.20   Trlycerides 0.0 - 149.0 mg/dL 172 198(A) 142.0 131.0       Exercise Target Goals:    Exercise Program Goal: Individual exercise prescription set with THRR, safety & activity barriers. Participant demonstrates ability to understand and report RPE using BORG scale, to self-measure pulse accurately, and to acknowledge the importance of the exercise prescription.  Exercise Prescription Goal: Starting with aerobic activity 30 plus minutes a day, 3 days per week for initial exercise prescription. Provide home exercise prescription and guidelines that participant acknowledges understanding prior to discharge.  Activity Barriers & Risk Stratification:     Activity Barriers &  Cardiac Risk Stratification - 07/06/16 1409      Activity Barriers & Cardiac Risk Stratification   Activity Barriers History of Falls;Balance Concerns;Deconditioning;Muscular Weakness;Other (comment)   Comments occasional dizzy spells, r hip bone spurs      6 Minute Walk:     6 Minute Walk    Row Name 07/06/16 1404         6 Minute Walk   Phase Initial     Distance 1100 feet     Walk Time 6 minutes     # of Rest Breaks 0     MPH 2.08     METS 3.14     RPE 11     VO2 Peak 11     Symptoms No     Resting HR 78 bpm     Resting BP 128/64     Max Ex. HR 96 bpm     Max  Ex. BP 158/70     2 Minute Post BP 126/64        Initial Exercise Prescription:     Initial Exercise Prescription - 07/06/16 1400      Date of Initial Exercise RX and Referring Provider   Date 07/06/16   Referring Provider Lujean Amel MD     Treadmill   MPH 1.8   Grade 0.5   Minutes 15   METs 2.5     NuStep   Level 1   Minutes 15   METs 2     Biostep-RELP   Level 1   Minutes 15   METs 2     Prescription Details   Frequency (times per week) 3   Duration Progress to 45 minutes of aerobic exercise without signs/symptoms of physical distress     Intensity   THRR 40-80% of Max Heartrate 110-143   Ratings of Perceived Exertion 11-15   Perceived Dyspnea 0-4     Progression   Progression Continue to progress workloads to maintain intensity without signs/symptoms of physical distress.     Resistance Training   Training Prescription Yes   Weight 2 lbs   Reps 10-15      Perform Capillary Blood Glucose checks as needed.  Exercise Prescription Changes:     Exercise Prescription Changes    Row Name 07/06/16 1300 07/16/16 1000 07/29/16 1400 08/12/16 1200 08/27/16 1100     Exercise Review   Progression  -  - Yes Yes Yes     Response to Exercise   Blood Pressure (Admit) 128/84 144/68 142/82 124/70 138/88   Blood Pressure (Exercise) 158/70 162/82 138/60 134/70 192/78   Blood Pressure (Exit) 126/64 138/82 132/72 144/82 148/76   Heart Rate (Admit) 83 bpm 73 bpm 65 bpm 72 bpm 88 bpm   Heart Rate (Exercise) 96 bpm 97 bpm 99 bpm 90 bpm 99 bpm   Heart Rate (Exit) 78 bpm 75 bpm 89 bpm 68 bpm 72 bpm   Rating of Perceived Exertion (Exercise) 11 11 12 12 13    Duration  -  -  - Progress to 45 minutes of aerobic exercise without signs/symptoms of physical distress Progress to 45 minutes of aerobic exercise without signs/symptoms of physical distress   Intensity  -  -  - THRR unchanged THRR unchanged     Progression   Progression  -  -  - Continue to progress workloads  to maintain intensity without signs/symptoms of physical distress. Continue to progress workloads to maintain intensity without signs/symptoms of physical distress.   Average METs  -  2.63 1.75 1.7 2.9     Resistance Training   Training Prescription  - Yes Yes No No   Weight  - 2 2 -  doesnt do due to back  pain  -   Reps  - 10-15 10-15  -  -     Interval Training   Interval Training  - No No No No     Treadmill   MPH  - 1  -  - 2   Grade  - 0  -  - 0.5   Minutes  - 15  -  - 15   METs  - 1.77  -  - 2.81     NuStep   Level  -  - 3 3 3    Minutes  -  - 15 15 15    METs  -  - 1.5 1.4 3     Biostep-RELP   Level  - 1 1 1   -   Minutes  - 15 15 15   -   METs  -  - 2 2  -   Row Name 09/09/16 0900 09/10/16 1200           Exercise Review   Progression Yes  -        Response to Exercise   Blood Pressure (Exercise) 156/80  -      Blood Pressure (Exit) 148/80  -      Heart Rate (Admit) 76 bpm  -      Heart Rate (Exercise) 102 bpm  -      Heart Rate (Exit) 68 bpm  -      Rating of Perceived Exertion (Exercise) 12  -      Duration Progress to 45 minutes of aerobic exercise without signs/symptoms of physical distress  -      Intensity THRR unchanged  -        Progression   Progression Continue to progress workloads to maintain intensity without signs/symptoms of physical distress.  -      Average METs 2.2  -        Resistance Training   Training Prescription No  -        Interval Training   Interval Training No  -        NuStep   Level 3  -      Minutes 15  -      METs 2.2  -        Home Exercise Plan   Plans to continue exercise at  [facility] (comment)      Frequency  - Add 2 additional days to program exercise sessions.  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 First full day of exercise!  Nohea was oriented to gym and equipment including functions, settings, policies, and procedures.  Chea's individual exercise prescription and treatment plan were reviewed.  All starting workloads were established based on the results of the 6 minute walk test done at initial orientation visit.  The plan for exercise progression was also introduced and progression will be customized based on patient's performance and goals. Deaunna sees her PCP today so cannot attend. She called this morning and was concerned that her back  was sore aroun T8-9 where she had fractures years ago.  I advised she use less weight or no weight next time and continue to monitor her pain.  She says it was already hurting last week before she came to class. Zanita has complained her heart is really racing after she leaves Cardiac Rehab. Heart rate 106 on treadmill in Cardiac Rehab only exercising at 1.63mh and 0.5% incline.I had KMelloniedrink 200 cc of water. I mentioned deconditioning and KElodiatold me she "was pulling brush before her heart attack." KTessisaid she feels part of it is due to her urinary tract infection that she has now. KNatajahis very concerned that her heart "is racing for hours after she leaves Cardiac Rehab. Heart rate at the end of Cardiac Rehab was 86 after cool down and relaxation. KEllinoreis progressing well with exercise.   Row Name 08/12/16 1302 08/27/16 1109 09/09/16 0954 09/10/16 1232     Exercise Comments KJourniiis progressing well with exercise and modifying as needed for her back. KCallenis progressing well with exercise. KElcieis progressing well with exercise. Home exercise was reviewed along with THR, RPE and safety precautions.  [facility]       Discharge Exercise Prescription (Final Exercise Prescription Changes):     Exercise Prescription Changes - 09/10/16 1200      Home Exercise Plan   [facility]   Frequency Add 2 additional days to program exercise sessions.  SChebansein yTalpa     Nutrition:  Target Goals: Understanding of nutrition guidelines, daily intake of sodium <15045m cholesterol <20051mcalories 30% from fat and 7% or less from saturated fats, daily to have 5 or more servings of fruits and vegetables.  Biometrics:     Pre Biometrics - 07/06/16 1418      Pre Biometrics   Height 5' 3"  (1.6 m)   Weight 152 lb 8 oz (69.2 kg)   Waist Circumference 33 inches   Hip Circumference 40.5 inches   Waist to Hip Ratio 0.81 %   BMI (Calculated) 27.1   Single Leg Stand 0 seconds       Nutrition Therapy Plan and Nutrition Goals:     Nutrition Therapy & Goals - 07/06/16 1442      Intervention Plan   Intervention Prescribe, educate and counsel regarding individualized specific dietary modifications aiming towards targeted core components such as weight, hypertension, lipid management, diabetes, heart failure and other comorbidities.   Expected Outcomes Short Term Goal: Understand basic principles of dietary content, such as calories, fat, sodium, cholesterol and nutrients.;Short Term Goal: A plan has been developed with personal nutrition goals set during dietitian appointment.;Long Term Goal: Adherence to prescribed nutrition plan.      Nutrition Discharge: Rate Your Plate Scores:     Nutrition Assessments - 07/06/16 1443      Rate Your Plate Scores   Pre Score 57   Pre Score % 63 %      Nutrition Goals Re-Evaluation:   Psychosocial: Target Goals: Acknowledge presence or absence of depression, maximize coping skills, provide positive support system. Participant is able to verbalize types and ability to use techniques and skills needed for reducing stress and depression.  Initial Review & Psychosocial Screening:     Initial Psych Review & Screening - 07/06/16 1453      Family Dynamics   Concerns  --  Attended couseling after  daughter's death      Quality of Life Scores:     Quality of Life - 07/06/16 1438      Quality of Life Scores   Health/Function Pre 20.92 %   Socioeconomic Pre 27.92 %   Psych/Spiritual Pre 23.33 %   Family Pre 29.38 %   GLOBAL Pre 24.03 %      PHQ-9: Recent Review Flowsheet Data    Depression screen [facility] 2/9 07/06/2016   Decreased Interest 3   Down, Depressed, Hopeless 0   PHQ - 2 Score 3   Altered sleeping 0   Tired, decreased energy 3   Change in appetite 0   Feeling bad or failure about yourself  0   Trouble concentrating 0   Moving slowly or fidgety/restless 0   Suicidal thoughts 0   PHQ-9 Score 6   Difficult doing work/chores Very difficult      Psychosocial Evaluation and Intervention:     Psychosocial Evaluation - 07/15/16 1733      Psychosocial Evaluation & Interventions   Interventions Encouraged to exercise with the program and follow exercise prescription   Comments Counselor met with Ms. Tamera Punt today for initial psychosocial evaluation.  She is a 60 year old who reports "passing out" at a wedding in 09-Jun-2023 due to a heart attack and resulted in surgery with (2) stents inserted.  She lives alone, but reports having a strong support system with sisters and a brother who live close by and "tons of friends" and church family who are involved in her life.  Ms. C states she just recovered from a UTI and has been feeling extremely weak since that time.  She sleeps "okay" with about (5) hours each night.  She reports to having been very sedentary since the heart attack.  Ms. C states her appetite is improving since the UTI.  She denies a history of depression or anxiety or any current symptoms and states her mood is typically positive.  Ms. C lost a daughter 5 years ago and a niece passed away this past 06-09-23, so she has been grieving those losses.  She has a son who just got married and lives 2 hours away which brings her "great joy."  Ms. Loletha Grayer  has goals to be stronger and not dizzy as much.  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   Counselor provided supportive services and will continue to follow with Ms. C throughout the course of this program.        Psychosocial Re-Evaluation:     Psychosocial Re-Evaluation    Row Name 07/24/16 1316 08/10/16 1734 08/24/16 1724         Psychosocial Re-Evaluation   Interventions Encouraged to attend Cardiac Rehabilitation for the exercise  -  -     Comments I called Oswaldo Done to check on her. Talajah said last night was a little better with her heart rate after Cardiac Rehab. I suggested to Neima to drink a lot of water before she gets on the treadmill. Kitti said she finally felt able to drive for the first time in a long time today since before she felt too weak to drive due to her UTI etc. Zaydah said her 64 years old daughter died several years ago. Her 57 year old daughter was hit by a truck on her way home from teaching school. Dreonna reported  that her 48 year old niece died of a massive heart attack a couple of weeks ago and left a 61 year old and twins 61 years old.Santiago Glad said they have a bad family history of heart disease-ie her father died of a massive heart attack and Kelilah reported that after the ambulance took her father to the hospital she said she her brother that she needed to take him to the hospital and she never got him there -he died of a heart attack also. Leonor said she divorced her husband. 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 Follow up with Ms. Bogie today reporting had another drug reaction last Friday and hopefully the new drug for blood pressure is going to help with decreasing her light headedness and have no negative side effects.  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  This is a huge loss for her.  Counselor discussed leaning on her friends and her faith as ways to cope, in addition to consistent exercise.  She maintains that her mood continues to be mostly positive, but counselor is concerned about all of her losses and will continue to follow with Ms. C and provide supportive services.   Ms. Rials reports she is feeling somewhat better and stronger lately; as she is not as dizzy; is over the UTI finally; and has more energy in general.   She also reports she has been driving a little lately; which is encouraging.  She plans to have a Halloween party and is preparing for this; which is an annual event she does.  Counselor commended Ms. Maceachern for hanging in there during all her health and work related disappointments and making the most of life currently.       Continued Psychosocial Services Needed Yes  -  -        Vocational Rehabilitation: Provide vocational rehab assistance to qualifying candidates.   Vocational Rehab Evaluation & Intervention:     Vocational Rehab - 07/06/16 1453      Initial Vocational Rehab Evaluation & Intervention   Assessment shows need for Vocational Rehabilitation No      Education: Education Goals: Education classes will be provided on a weekly basis, covering required topics. Participant will state understanding/return demonstration of topics presented.  Learning Barriers/Preferences:     Learning Barriers/Preferences - 07/06/16 1452      Learning Barriers/Preferences   Learning Barriers None   Learning Preferences None      Education Topics: General Nutrition Guidelines/Fats and Fiber: -Group instruction provided by verbal, written material, models and posters to present the general guidelines for heart healthy nutrition. Gives an explanation and review of dietary fats and fiber. Flowsheet Row Cardiac  Rehab from 09/07/2016 in [facility] Cardiac and Pulmonary Rehab  Date  07/27/16  Educator  P. Leonel Ramsay, [place]  Instruction Review Code  2- meets goals/outcomes      Controlling Sodium/Reading Food Labels: -Group verbal and written material supporting the discussion of sodium use in heart healthy nutrition. Review and explanation with models, verbal and written materials for utilization of the food label.   Exercise Physiology & Risk Factors: - Group verbal and written instruction with models to review the exercise physiology of the cardiovascular system and associated critical values. Details cardiovascular disease risk factors and  the goals associated with each risk factor. Flowsheet Row Cardiac Rehab from 09/07/2016 in [facility] Cardiac and Pulmonary Rehab  Date  08/10/16  Educator  [facility]  Instruction Review Code  2- meets goals/outcomes      Aerobic Exercise & Resistance Training: - Gives group verbal and written discussion on the health impact of inactivity. On the components of aerobic and resistive training programs and the benefits of this training and how to safely progress through these programs. Flowsheet Row Cardiac Rehab from 09/07/2016 in [facility] Cardiac and Pulmonary Rehab  Date  08/12/16  Educator  AS  Instruction Review Code  2- meets goals/outcomes      Flexibility, Balance, General Exercise Guidelines: - Provides group verbal and written instruction on the benefits of flexibility and balance training programs. Provides general exercise guidelines with specific guidelines to those with heart or lung disease. Demonstration and skill practice provided. Flowsheet Row Cardiac Rehab from 09/07/2016 in [facility] Cardiac and Pulmonary Rehab  Date  08/17/16  Educator  [facility]  Instruction Review Code  2- meets goals/outcomes      Stress Management: - Provides group verbal and written instruction about the health risks of elevated stress, cause of high stress, and healthy ways to reduce  stress. Flowsheet Row Cardiac Rehab from 09/07/2016 in [facility] Cardiac and Pulmonary Rehab  Date  08/26/16  Educator  [facility]  Instruction Review Code  2- meets goals/outcomes      Depression: - Provides group verbal and written instruction on the correlation between heart/lung disease and depressed mood, treatment options, and the stigmas associated with seeking treatment. Flowsheet Row Cardiac Rehab from 09/07/2016 in [facility] Cardiac and Pulmonary Rehab  Date  07/29/16  Educator  [facility]  Instruction Review Code  2- meets goals/outcomes      Anatomy & Physiology of the Heart: - Group verbal and written instruction and models provide basic cardiac anatomy and physiology, with the coronary electrical and arterial systems. Review of: AMI, Angina, Valve disease, Heart Failure, Cardiac Arrhythmia, Pacemakers, and the ICD. Flowsheet Row Cardiac Rehab from 09/07/2016 in [facility] Cardiac and Pulmonary Rehab  Date  08/24/16  Educator  CE  Instruction Review Code  2- meets goals/outcomes      Cardiac Procedures: - Group verbal and written instruction and models to describe the testing methods done to diagnose heart disease. Reviews the outcomes of the test results. Describes the treatment choices: Medical Management, Angioplasty, or Coronary Bypass Surgery. Flowsheet Row Cardiac Rehab from 09/07/2016 in [facility] Cardiac and Pulmonary Rehab  Date  08/31/16  Educator  CE  Instruction Review Code  2- meets goals/outcomes      Cardiac Medications: - Group verbal and written instruction to review commonly prescribed medications for heart disease. Reviews the medication, class of the drug, and side effects. Includes the steps to properly store meds and maintain the prescription regimen. Flowsheet Row Cardiac Rehab from 09/07/2016 in [facility] Cardiac and Pulmonary Rehab  Date  09/07/16  Educator  SB  Instruction Review Code  2- meets goals/outcomes      Go Sex-Intimacy & Heart Disease, Get SMART - Goal Setting: -  Group verbal and written instruction through game format to discuss heart disease and the return to sexual intimacy. Provides group verbal and written material to discuss and apply goal setting through the application of the S.M.A.R.T. Method. Flowsheet Row Cardiac Rehab from 09/07/2016 in [facility] Cardiac and Pulmonary Rehab  Date  08/31/16  Educator  CE  Instruction Review Code  2- meets goals/outcomes  Other Matters of the Heart: - Provides group verbal, written materials and models to describe Heart Failure, Angina, Valve Disease, and Diabetes in the realm of heart disease. Includes description of the disease process and treatment options available to the cardiac patient. Flowsheet Row Cardiac Rehab from 09/07/2016 in [facility] Cardiac and Pulmonary Rehab  Date  08/24/16  Educator  CE  Instruction Review Code  2- meets goals/outcomes      Exercise & Equipment Safety: - Individual verbal instruction and demonstration of equipment use and safety with use of the equipment. Flowsheet Row Cardiac Rehab from 09/07/2016 in [facility] Cardiac and Pulmonary Rehab  Date  07/06/16  Educator   SB  Instruction Review Code  2- meets goals/outcomes      Infection Prevention: - Provides verbal and written material to individual with discussion of infection control including proper hand washing and proper equipment cleaning during exercise session. Flowsheet Row Cardiac Rehab from 09/07/2016 in [facility] Cardiac and Pulmonary Rehab  Date  07/06/16  Educator  SB  Instruction Review Code  2- meets goals/outcomes      Falls Prevention: - Provides verbal and written material to individual with discussion of falls prevention and safety. Flowsheet Row Cardiac Rehab from 09/07/2016 in [facility] Cardiac and Pulmonary Rehab  Date  07/06/16  Educator  SB  Instruction Review Code  2- meets goals/outcomes      Diabetes: - Individual verbal and written instruction to review signs/symptoms of diabetes, desired ranges  of glucose level fasting, after meals and with exercise. Advice that pre and post exercise glucose checks will be done for 3 sessions at entry of program.    Knowledge Questionnaire Score:     Knowledge Questionnaire Score - 07/06/16 1452      Knowledge Questionnaire Score   Pre Score 23/28      Core Components/Risk Factors/Patient Goals at Admission:     Personal Goals and Risk Factors at Admission - 07/06/16 1449      Core Components/Risk Factors/Patient Goals on Admission   Sedentary Yes   Intervention Provide advice, education, support and counseling about physical activity/exercise needs.;Develop an individualized exercise prescription for aerobic and resistive training based on initial evaluation findings, risk stratification, comorbidities and participant's personal goals.   Expected Outcomes Achievement of increased cardiorespiratory fitness and enhanced flexibility, muscular endurance and strength shown through measurements of functional capacity and personal statement of participant.   Increase Strength and Stamina Yes   Intervention Provide advice, education, support and counseling about physical activity/exercise needs.;Develop an individualized exercise prescription for aerobic and resistive training based on initial evaluation findings, risk stratification, comorbidities and participant's personal goals.   Expected Outcomes Achievement of increased cardiorespiratory fitness and enhanced flexibility, muscular endurance and strength shown through measurements of functional capacity and personal statement of participant.   Hypertension Yes   Intervention Provide education on lifestyle modifcations including regular physical activity/exercise, weight management, moderate sodium restriction and increased consumption of fresh fruit, vegetables, and low fat dairy, alcohol moderation, and smoking cessation.;Monitor prescription use compliance.   Expected Outcomes Short Term:  Continued assessment and intervention until BP is < 140/48m HG in hypertensive participants. < 130/863mHG in hypertensive participants with diabetes, heart failure or chronic kidney disease.;Long Term: Maintenance of blood pressure at goal levels.   Lipids Yes   Intervention Provide education and support for participant on nutrition & aerobic/resistive exercise along with prescribed medications to achieve LDL <7031mHDL >55m18m Expected Outcomes Short Term: Participant states understanding of desired cholesterol  values and is compliant with medications prescribed. Participant is following exercise prescription and nutrition guidelines.;Long Term: Cholesterol controlled with medications as prescribed, with individualized exercise RX and with personalized nutrition plan. Value goals: LDL < 44m, HDL > 40 mg.      Core Components/Risk Factors/Patient Goals Review:      Goals and Risk Factor Review    Row Name 07/24/16 1222 08/12/16 1736 09/10/16 1228         Core Components/Risk Factors/Patient Goals Review   Personal Goals Review  - Sedentary;Increase Strength and Stamina;Hypertension;Lipids;Stress;Weight Management/Obesity Lipids;Increase Strength and Stamina     Review KGaryhas complained her heart is really racing after she leaves Cardiac Rehab. Heart rate 106 on treadmill in Cardiac Rehab only exercising at 1.854m and 0.5% incline.I had KaSaulrink 200 cc of water. I mentioned deconditioning and KaDebbraold me she "was pulling brush before her heart attack." KaHydeeaid she feels part of it is due to her urinary tract infection that she has now. KaTriannas very concerned that her heart "is racing for hours after she leaves Cardiac Rehab. Heart rate at the end of Cardiac Rehab was 86 after cool down and relaxation. Danielle's weight has started to creep up as she is taking her rides to rehab out to dinner.  She is still having issues with her UTI, Chron's, and bad drug reactions.  She is still coping  with a lot of stress and recently was told that they would not be holding her job after 10/26.  Will let counselor know so that she can follow up as well.  She has noted that exercise is starting to help her feel better as she is able to do more at home than previously.  She is still concerned that she cannot do as much as before, but reasssured that she is still in the recovery phase and fighting other infections.  She has not had her labs tested recently.  Her blood pressure was good today, but she continues to have episodes of low pressure and is wearing an event monitor. KaTorryeports being able to vaccum, mop and wash window with less fatigue than before.  She is pleased to be able to do these chores.  She is not taking cholesterol medication now on Dr orders due to other health issues.     Expected Outcomes Quality exercise/conditioned. KaAquanettaill continue to come to exercise and education classes to work on risk factor modification and for support.  We will continue to monitor. KaTyreannaill continue to build strength and endurance and be able to complete ADLs with more ease.  She will follow up with her Dr about cholesterol.          Core Components/Risk Factors/Patient Goals at Discharge (Final Review):      Goals and Risk Factor Review - 09/10/16 1228      Core Components/Risk Factors/Patient Goals Review   Personal Goals Review Lipids;Increase Strength and Stamina   Review KaNeriaheports being able to vaccum, mop and wash window with less fatigue than before.  She is pleased to be able to do these chores.  She is not taking cholesterol medication now on Dr orders due to other health issues.   Expected Outcomes KaLillianneill continue to build strength and endurance and be able to complete ADLs with more ease.  She will follow up with her Dr about cholesterol.        ITP Comments:     ITP Comments  [place] Name 07/06/16 1438 07/15/16 1746 07/16/16 1035 07/22/16 0654 07/23/16 1648   ITP Comments  Initial ITP created during [facility] Review. Diagnosis documentation can be found [facility] encounter 06/16/2016 First full day of exercise!  Briannia was oriented to gym and equipment including functions, settings, policies, and procedures.  Shataya's individual exercise prescription and treatment plan were reviewed.  All starting workloads were established based on the results of the 6 minute walk test done at initial orientation visit.  The plan for exercise progression was also introduced and progression will be customized based on patient's performance and goals. Ayza sees her PCP today so cannot attend. She called this morning and was concerned that her back was sore aroun T8-9 where she had fractures years ago.  I advised she use less weight or no weight next time and continue to monitor her pain.  She says it was already hurting last week before she came to class. 30 day review. Continue with ITP unless changes noted by Medical Director at signature of review.  Has started program this week Yilia instructed to contact her doctor's office since she c/o of her heart rate going fast in the evening when she leaves here. Jesse reported she is still on 139m Verapamil. KSerinreported that tha MD increased her verapamil to 16110mbut decreased it when she c/o being tired. KaAvyanan treadmill at 1.48m54mheart rate 100 even after drinking  240 cc water.    RowWeogufkame 07/24/16 1222 07/24/16 1315 07/31/16 1219 07/31/16 1220 08/03/16 1628   ITP Comments I sent a note via [facility] to Dr. CalClayborn Bignessday-Joellyn has complained her heart is really racing after she leaves Cardiac Rehab. Heart rate 106 on treadmill in Cardiac Rehab only exercising at 1.8mp24mnd 0.5% incline.I had KareKelienk 200 cc of water. I mentioned deconditioning and KareFlorentinad me she "was pulling brush before her heart attack." KareCumid she feels part of it is due to her urinary tract infection that she has now. KareMakenzeyvery concerned that her heart "is  racing for hours after she leaves Cardiac Rehab. Heart rate at the end of Cardiac Rehab was 86 after cool down and relaxation. I called KareEureka Valdescheck on her. KarePrestinad last night was a little better with her heart rate after Cardiac Rehab. I suggested to KareDinnadrink a lot of water before she gets on the treadmill. KareEmmanuelled she finally felt able to drive for the first time in a long time today since before she felt too weak to drive due to her UTI etc. KareSalleed her 22 y37rs old daughter died several years ago. Her 22 y45r old daughter was hit by a truck on her way home from teaching school. KareZiaireorted that her 48 y41r old niece died of a massive heart attack a couple of weeks ago and left a 13 y49r old and twins 23 y90rs old.. KaSantiago Gladd they have a bad family history of heart disease-ie her father died of a massive heart attack and KarePenneorted that after the ambulance took her father to the hospital she said she her brother that she needed to take him to the hospital and she never got him there -he died of a heart attack also. KareDajond she divorced her husband. 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 KareTaquillat a vm on Cardiac Rehab phone and said that she did drive her  on WEd 9/20 for Cardiac REhab but she felt so weak that she didn't think she should do Cardiac Rehab. Joseline is still struggling with feeling weak which keeps her for exercising.  Roseanne called to tell us she is at [facility] for her recurrent  UTI.  She also stated that she is being evaluated for a leaking valve.    Row Name 08/17/16 1825 08/19/16 0017 09/16/16 0620       ITP Comments Genevra drove to class for the first time today. She is feeling better, now that the UTI she has had over a month is better.  30 day review. Continue with ITP unless changes noted by Medical Director at signature of review. HAs been out medical concerns. Returning feeling better. 30  day review completed for Medical Director physician review and signature. Continue ITP unless changes made by physician.        Comments:

## 2016-09-16 NOTE — Progress Notes (Signed)
Daily Session Note  Patient Details  Name: Morgan Moore MRN: [id number] Date of Birth: [birth year] Referring Provider:   Flowsheet Row Cardiac Rehab from 07/06/2016 in [facility] Card[facility]  Referring Provider  Lujean Amel MD      Encounter Date: 09/14/2016  Check In:     Session Check In - 09/16/16 1620      Check-In   Location [facility]-Cardiac & Pulmonary Rehab   Staff Present Gerlene Burdock, RN, Vickki Hearing, BA, ACSM CEP, Exercise Physiologist;Susanne Bice, RN, BSN, CCRP   Supervising physician immediately available to respond to emergencies See telemetry face sheet for immediately available ER MD   Medication changes reported     No   Fall or balance concerns reported    No   Warm-up and Cool-down Performed on first and last piece of equipment   Resistance Training Performed Yes   VAD Patient? No     VAD patient   Has back up controller? No     Pain Assessment   Currently in Pain? No/denies         Goals Met:  Proper associated with RPD/PD & O2 Sat Exercise tolerated well  Goals Unmet:  Not Applicable  Comments: Goals lecture today and Sexual intamacy   Dr. Emily Filbert is Medical Director for [place] and [facility].

## 2016-09-22 ENCOUNTER — Encounter: Payer: Self-pay | Admitting: Emergency Medicine

## 2016-09-22 ENCOUNTER — Emergency Department: Payer: BC Managed Care – PPO

## 2016-09-22 ENCOUNTER — Ambulatory Visit: Payer: BC Managed Care – PPO | Admitting: Family

## 2016-09-22 ENCOUNTER — Emergency Department
Admission: EM | Admit: 2016-09-22 | Discharge: 2016-09-22 | Disposition: A | Discharge status: Home / Self Care | Payer: BC Managed Care – PPO | Attending: Emergency Medicine | Admitting: Emergency Medicine

## 2016-09-22 ENCOUNTER — Other Ambulatory Visit: Payer: Self-pay

## 2016-09-22 DIAGNOSIS — R51 Headache: Secondary | ICD-10-CM | POA: Insufficient documentation

## 2016-09-22 DIAGNOSIS — Z7982 Long term (current) use of aspirin: Secondary | ICD-10-CM | POA: Diagnosis not present

## 2016-09-22 DIAGNOSIS — R531 Weakness: Secondary | ICD-10-CM | POA: Diagnosis not present

## 2016-09-22 DIAGNOSIS — I10 Essential (primary) hypertension: Secondary | ICD-10-CM | POA: Insufficient documentation

## 2016-09-22 DIAGNOSIS — I2510 Atherosclerotic heart disease of native coronary artery without angina pectoris: Secondary | ICD-10-CM | POA: Insufficient documentation

## 2016-09-22 DIAGNOSIS — Z79899 Other long term (current) drug therapy: Secondary | ICD-10-CM | POA: Diagnosis not present

## 2016-09-22 HISTORY — DX: Atherosclerotic heart disease of native coronary artery without angina pectoris: I25.10

## 2016-09-22 LAB — CBC
HCT: 40.9 % (ref 35.0–47.0)
Hemoglobin: 14.2 g/dL (ref 12.0–16.0)
MCH: 30.9 pg (ref 26.0–34.0)
MCHC: 34.7 g/dL (ref 32.0–36.0)
MCV: 88.9 fL (ref 80.0–100.0)
PLATELETS: 333 10*3/uL (ref 150–440)
RBC: 4.61 MIL/uL (ref 3.80–5.20)
RDW: 14.9 % — AB (ref 11.5–14.5)
WBC: 11.5 10*3/uL — ABNORMAL HIGH (ref 3.6–11.0)

## 2016-09-22 LAB — BASIC METABOLIC PANEL
Anion gap: 10 (ref 5–15)
BUN: 14 mg/dL (ref 6–20)
CALCIUM: 9.7 mg/dL (ref 8.9–10.3)
CHLORIDE: 103 mmol/L (ref 101–111)
CO2: 25 mmol/L (ref 22–32)
CREATININE: 0.99 mg/dL (ref 0.44–1.00)
GFR calc non Af Amer: >60 mL/min (ref >60)
Glucose, Bld: 108 mg/dL — ABNORMAL HIGH (ref 65–99)
Potassium: 3.6 mmol/L (ref 3.5–5.1)
Sodium: 138 mmol/L (ref 135–145)

## 2016-09-22 LAB — TROPONIN I: Troponin I: <0.03 ng/mL (ref <0.03)

## 2016-09-22 MED ORDER — ASPIRIN 81 MG PO CHEW
324.0000 mg | CHEWABLE_TABLET | Freq: Once | ORAL | Status: AC
Start: 2016-09-22 — End: 2016-09-22
  Administered 2016-09-22: 324 mg via ORAL

## 2016-09-22 MED ORDER — ASPIRIN 81 MG PO CHEW
CHEWABLE_TABLET | ORAL | Status: AC
  Filled 2016-09-22: qty 4

## 2016-09-22 MED ORDER — IOPAMIDOL (ISOVUE-370) INJECTION 76%
75.0000 mL | Freq: Once | INTRAVENOUS | Status: AC | PRN
  Administered 2016-09-22: 75 mL via INTRAVENOUS

## 2016-09-22 NOTE — ED Provider Notes (Signed)
[facility] Emergency Department Provider Note  Time seen: 5:23 PM  I have reviewed the triage vital signs and the nursing notes.   HISTORY  Chief Complaint Chest Pain    HPI Morgan Moore is a 60 y.o. female who presents to the emergency department with symptoms of intermittent weakness. Patient states in July she was diagnosed with a heart attack, had a cardiac catheterization at that time and required 2 stents to be placed. She states since then she has been having symptoms of intermittent generalized weakness. States some days she will feel okay and other days she will have this weakness and what she feels like she cannot walk, and feels like she might pass out. Patient also states she has had several anaphylactic reactions over the past few months to various drugs including ciprofloxacin, Keflex and sulfa. She is currently being worked up by an Horticulturist, commercial. She states she has had a chronic urinary tract infection over the past 45 months as well because of her antibiotic allergies which have limited the anabolic cyst she can receive. She is currently being treated by [facility] urology. Patient actually had a follow-up appointment today with [facility] and a urinalysis was performed. Patient denies any chest pain or shortness of breath. States she was feeling very weak today so she went to her cardiologist's office Dr. Clayborn Bigness. States her blood pressure was 031 systolic so they sent her to the emergency department for evaluation. Here the patient denies any symptoms at this time. She is very tearful throughout her examination, she is worried that she might have a stroke. Patient also states over the past several days she has been feeling and hearing her pulse in both her ears which is very concerning for her.  Past Medical History:  Diagnosis Date  . Allergy   . Chicken pox   . Coronary artery disease   . Heart murmur   . Hyperlipidemia   . Hypertension   .  Migraines    hormonal, puberty  . Nephrolithiasis   . Phlebitis     Patient Active Problem List   Diagnosis Date Noted  . History of frequent urinary tract infections 08/09/2016  . Weakness 07/02/2016  . CAD (coronary artery disease) 04/17/2016  . Light headedness 04/17/2016  . Left hip pain 04/13/2016  . Health care maintenance 01/19/2016  . Essential hypertension 11/07/2015  . Hypercholesterolemia 11/07/2015  . Migraine headache 11/07/2015  . Nephrolithiasis 11/07/2015  . GERD (gastroesophageal reflux disease) 11/07/2015  . Mitral regurgitation 11/07/2015  . Stress 11/07/2015    Past Surgical History:  Procedure Laterality Date  . BREAST BIOPSY    . heart murmur    . [place]    Prior to Admission medications   Medication Sig Start Date End Date Taking? Authorizing Provider  ALPRAZolam (XANAX) 0.25 MG tablet Take 1 tablet (0.25 mg total) by mouth at bedtime as needed. 01/16/16   Einar Pheasant, MD  aspirin EC 81 MG tablet Take 0.81 mg by mouth daily.    Historical Provider, MD  carvedilol (COREG) 6.25 MG tablet Take 6.25 mg by mouth 2 (two) times daily. 08/04/16 08/04/17  Historical Provider, MD  clopidogrel (PLAVIX) 75 MG tablet Take 75 mg by mouth daily.    Historical Provider, MD  EPINEPHrine 0.3 mg/0.3 mL IJ SOAJ injection 0.3 mg as needed. 12/18/13   Historical Provider, MD  fexofenadine (ALLEGRA) 180 MG tablet Take 180 mg by mouth daily.  Historical Provider, MD  fosfomycin (MONUROL) 3 g PACK Take 3 g by mouth once.    Historical Provider, MD  Mesalamine (DELZICOL) 400 MG CPDR DR capsule Take 6 capsules (2,400 mg total) by mouth daily. 01/07/16   Einar Pheasant, MD  omeprazole (PRILOSEC) 40 MG capsule Take 40 mg by mouth daily. Reported on 04/13/2016 04/29/15 04/28/16  Historical Provider, MD  ondansetron (ZOFRAN ODT) 4 MG disintegrating tablet Take 1 tablet (4 mg total) by mouth every 8 (eight) hours as needed for nausea or  vomiting. 01/16/16   Einar Pheasant, MD  rizatriptan (MAXALT) 5 MG tablet USE AS DIRECTED 04/28/16   Einar Pheasant, MD  rosuvastatin (CRESTOR) 5 MG tablet Take 1 tablet (5 mg total) by mouth daily. 04/13/16   Einar Pheasant, MD  tobramycin-dexamethasone [facility]) ophthalmic solution Place 1 drop into the right eye every 6 (six) hours. 07/02/16   Einar Pheasant, MD    Allergies  Allergen Reactions  . Ciprofloxacin Swelling  . Keflex [Cephalexin]     "anaphylaxis"   . Lisinopril     "anaphylaxis"  . Meclizine     "swelling"  . Sulfur     "anaphylaxis"  . Tessalon [Benzonatate]     "anaphylaxis"    Family History  Problem Relation Age of Onset  . Heart disease Father   . Heart disease Brother   . Cancer Maternal Aunt     Social History Social History  Substance Use Topics  . Smoking status: Never Smoker  . Smokeless tobacco: Never Used  . Alcohol use No    Review of Systems Constitutional: Negative for fever.Intermittent generalized weakness. Cardiovascular: Negative for chest pain. Respiratory: Negative for shortness of breath. Gastrointestinal: Negative for abdominal pain Genitourinary: Negative for dysuria. Neurological: Mild headache. Generalized weakness but denies any focal weakness or numbness. 10-point ROS otherwise negative.  ____________________________________________   PHYSICAL EXAM:  VITAL SIGNS: ED Triage Vitals  Enc Vitals Group     BP 09/22/16 1627 (!) 188/88     Pulse Rate 09/22/16 1627 81     Resp 09/22/16 1627 18     Temp 09/22/16 1627 97.7 F (36.5 C)     Temp Source 09/22/16 1627 Oral     SpO2 09/22/16 1627 99 %     Weight 09/22/16 1628 152 lb (68.9 kg)     Height 09/22/16 1628 5' 3"  (1.6 m)     Head Circumference --      Peak Flow --      Pain Score 09/22/16 1628 6     Pain Loc --      Pain Edu? --      Excl. in [place]? --     Constitutional: Alert and oriented. Well appearing and in no distress. Eyes: Normal exam ENT   Head:  Normocephalic and atraumatic. No carotid bruit.   Mouth/Throat: Mucous membranes are moist. Cardiovascular: Normal rate, regular rhythm. No murmur Respiratory: Normal respiratory effort without tachypnea nor retractions. Breath sounds are clear  Gastrointestinal: Soft and nontender. No distention.   Musculoskeletal: Nontender with normal range of motion in all extremities. No lower extremity edema. Neurologic:  Normal speech and language. No gross focal neurologic deficits Skin:  Skin is warm, dry and intact.  Psychiatric: Mood and affect are normal. Speech and behavior are normal.   ____________________________________________    EKG  EKG reviewed and interpreted by myself shows normal sinus rhythm at 86 bpm, narrow QRS, normal axis, normal intervals, nonspecific but no concerning ST changes.  ____________________________________________  RADIOLOGY  CT scans are negative Chest x-ray negative ____________________________________________   INITIAL IMPRESSION / ASSESSMENT AND PLAN / ED COURSE  Pertinent labs & imaging results that were available during my care of the patient were reviewed by me and considered in my medical decision making (see chart for details).  The patient presents to the emergency department with symptoms of generalized weakness. Patient is concerned that her heart could be causing her weakness although she states she had a recent echocardiogram showing greater than 65% ejection fraction. Denies any chest pain. Patient's labs are largely within normal limits including her cardiac enzymes. Patient's chest x-ray is negative. EKG is reassuring. Patient is also very worried about hearing her pulse in both for ears. I discussed with the patient that this could be due to a wide pulse pressure as her blood pressure is fairly elevated. Patient also states mild headache with intermittent weakness we'll proceed with a CT angiography of the head and neck to further  evaluate. We'll obtain a repeat troponin and closely monitor in the emergency department. Patient had a catheterized urine specimen performed today at [facility] urology. Care everywhere shows a clean POC urinalysis, and the patient states a culture was sent today as well. No need to repeat a urinalysis in the emergency department currently.  Labs are largely within normal limits. Repeat troponin is negative. CT scan chest x-ray negative. Patient's blood pressure currently 161/85. Patient has a follow-up appointment with her cardiologist tomorrow morning. Patient states she will follow-up.  ____________________________________________   FINAL CLINICAL IMPRESSION(S) / ED DIAGNOSES  Generalized weakness    Harvest Dark, MD 09/22/16 2027

## 2016-09-22 NOTE — ED Triage Notes (Signed)
Patient presents to the ED with "heaviness" to the center of her chest intermittently today and weakness that has been increasing over the past couple of weeks.  Patient reports she is being treated for a UTI and patient is complaining of "hearing her blood pressure in her ears."  Patient is tearful during triage and shaky.  Patient states Dr. Clayborn Bigness is her cardiologist.  Patient has history of heart attacks.

## 2016-09-22 NOTE — ED Notes (Signed)
Patient transported to X-ray 

## 2016-10-05 ENCOUNTER — Encounter: Payer: Self-pay | Admitting: Internal Medicine

## 2016-10-05 ENCOUNTER — Ambulatory Visit (INDEPENDENT_AMBULATORY_CARE_PROVIDER_SITE_OTHER): Payer: BC Managed Care – PPO | Admitting: Internal Medicine

## 2016-10-05 DIAGNOSIS — I10 Essential (primary) hypertension: Secondary | ICD-10-CM | POA: Diagnosis not present

## 2016-10-05 DIAGNOSIS — R531 Weakness: Secondary | ICD-10-CM

## 2016-10-05 DIAGNOSIS — E7800 Pure hypercholesterolemia, unspecified: Secondary | ICD-10-CM

## 2016-10-05 DIAGNOSIS — F439 Reaction to severe stress, unspecified: Secondary | ICD-10-CM

## 2016-10-05 DIAGNOSIS — I2510 Atherosclerotic heart disease of native coronary artery without angina pectoris: Secondary | ICD-10-CM

## 2016-10-05 NOTE — Progress Notes (Signed)
Pre visit review using our clinic review tool, if applicable. No additional management support is needed unless otherwise documented below in the visit note. 

## 2016-10-05 NOTE — Progress Notes (Signed)
Patient ID: Morgan Moore, female   DOB: Jun 11, 1956, 60 y.o.   MRN: [id number]   Subjective:    Patient ID: Morgan Moore, female    DOB: 08-28-1956, 60 y.o.   MRN: [id number]  HPI  Patient here for a scheduled follow up.  She is accompanied by her sister.  History obtained from both of them.  She saw Dr Clayborn Bigness on 09/24/16.  He increased her coreg to 12.38m bid.  She is taking 6.25 mg 1 1/2 tablet bid.  States the other dose bottomed her blood pressure.  He also started her on lexapro 226mq day.  She is only taking 1/4 bid.  States she is afraid to increase the dose because of her intolerance to medication.  Recommended myoview and nephrology referral.  She reports her blood pressure has started to increase recently.  Was doing well and going to cardiac rehab.  She now has episodes of feeling weak (again).  Blood pressure varying.  Was back in [facility] last week - with elevated blood pressure.  She is eating.  Discussed the need to stay hydrated.  No nausea or vomiting.  Bowels stable.     Past Medical History:  Diagnosis Date  . Allergy   . Chicken pox   . Coronary artery disease   . Heart murmur   . Hyperlipidemia   . Hypertension   . Migraines    hormonal, puberty  . Nephrolithiasis   . Phlebitis    Past Surgical History:  Procedure Laterality Date  . BREAST BIOPSY    . heart murmur    . TONSILLECTOMY AND ADENOIDECTOMY  1962  . tubaligation  1990   Family History  Problem Relation Age of Onset  . Heart disease Father   . Heart disease Brother   . Cancer Maternal Aunt    Social History   Social History  . Marital status: Divorced    Spouse name: N/A  . Number of children: N/A  . Years of education: N/A   Social History Main Topics  . Smoking status: Never Smoker  . Smokeless tobacco: Never Used  . Alcohol use No  . Drug use: Unknown  . Sexual activity: Not Asked   Other Topics Concern  . None   Social History Narrative  . None    Outpatient  Encounter Prescriptions as of 10/05/2016  Medication Sig  . aspirin EC 81 MG tablet Take 0.81 mg by mouth daily.  . carvedilol (COREG) 6.25 MG tablet Take 6.25 mg by mouth 2 (two) times daily. 1.5 every 12 hours  . clopidogrel (PLAVIX) 75 MG tablet Take 75 mg by mouth daily.  . Marland KitchenPINEPHrine 0.3 mg/0.3 mL IJ SOAJ injection 0.3 mg as needed.  . Marland Kitchenscitalopram (LEXAPRO) 20 MG tablet 10 mg.  . fexofenadine (ALLEGRA) 180 MG tablet Take 180 mg by mouth daily.  . fosfomycin (MONUROL) 3 g PACK Take 3 g by mouth once.  . Mesalamine (DELZICOL) 400 MG CPDR DR capsule Take 6 capsules (2,400 mg total) by mouth daily.  . ondansetron (ZOFRAN ODT) 4 MG disintegrating tablet Take 1 tablet (4 mg total) by mouth every 8 (eight) hours as needed for nausea or vomiting.  . rizatriptan (MAXALT) 5 MG tablet USE AS DIRECTED  . rosuvastatin (CRESTOR) 5 MG tablet Take 1 tablet (5 mg total) by mouth daily.  . Marland Kitchenobramycin-dexamethasone (TOBRADEX) ophthalmic solution Place 1 drop into the right eye every 6 (six) hours.  . verapamil (CALAN) 40 MG tablet Take by  mouth.  . [DISCONTINUED] ALPRAZolam (XANAX) 0.25 MG tablet Take 1 tablet (0.25 mg total) by mouth at bedtime as needed.  Marland Kitchen omeprazole (PRILOSEC) 40 MG capsule Take 40 mg by mouth daily. Reported on 04/13/2016   No facility-administered encounter medications on file as of 10/05/2016.     Review of Systems  Constitutional: Positive for fatigue. Negative for appetite change and unexpected weight change.  HENT: Negative for congestion and sinus pressure.   Respiratory: Negative for cough, chest tightness and shortness of breath.   Cardiovascular: Negative for chest pain, palpitations and leg swelling.  Gastrointestinal: Negative for abdominal pain, diarrhea, nausea and vomiting.  Genitourinary: Negative for difficulty urinating and dysuria.  Musculoskeletal: Negative for joint swelling and myalgias.  Skin: Negative for color change and rash.  Neurological: Negative for  headaches.  Psychiatric/Behavioral:       Some increased anxiety.  Does not feel well and this creates anxiety.         Objective:     Blood pressure rechecked by me:  136/82  Physical Exam  Constitutional: She appears well-developed and well-nourished. No distress.  HENT:  Nose: Nose normal.  Mouth/Throat: Oropharynx is clear and moist.  Neck: Neck supple. No thyromegaly present.  Cardiovascular: Normal rate and regular rhythm.   Pulmonary/Chest: Breath sounds normal. No respiratory distress. She has no wheezes.  Abdominal: Soft. Bowel sounds are normal. There is no tenderness.  Musculoskeletal: She exhibits no edema or tenderness.  Lymphadenopathy:    She has no cervical adenopathy.  Skin: No rash noted. No erythema.  Psychiatric: She has a normal mood and affect. Her behavior is normal.    BP 136/82   Pulse 70   Temp 98.2 F (36.8 C) (Oral)   Ht 5' 3"  (1.6 m)   Wt 151 lb 9.6 oz (68.8 kg)   SpO2 98%   BMI 26.85 kg/m  Wt Readings from Last 3 Encounters:  10/05/16 151 lb 9.6 oz (68.8 kg)  09/22/16 152 lb (68.9 kg)  08/07/16 155 lb 12.8 oz (70.7 kg)     Lab Results  Component Value Date   WBC 11.5 (H) 09/22/2016   HGB 14.2 09/22/2016   HCT 40.9 09/22/2016   PLT 333 09/22/2016   GLUCOSE 108 (H) 09/22/2016   CHOL 258 (H) 04/09/2016   TRIG 131.0 04/09/2016   HDL 54.20 04/09/2016   LDLCALC 178 (H) 04/09/2016   ALT 21 07/02/2016   AST 23 07/02/2016   NA 138 09/22/2016   K 3.6 09/22/2016   CL 103 09/22/2016   CREATININE 0.99 09/22/2016   BUN 14 09/22/2016   CO2 25 09/22/2016   TSH 2.18 07/02/2016   INR 1.1 09/21/2014    Ct Angio Head W Or Wo Contrast  Result Date: 09/22/2016 CLINICAL DATA:  60 y/o F; high blood pressure, weakness, and feels/hears pulse in head. EXAM: CT ANGIOGRAPHY HEAD AND NECK TECHNIQUE: Multidetector CT imaging of the head and neck was performed using the standard protocol during bolus administration of intravenous contrast. Multiplanar  CT image reconstructions and MIPs were obtained to evaluate the vascular anatomy. Carotid stenosis measurements (when applicable) are obtained utilizing NASCET criteria, using the distal internal carotid diameter as the denominator. CONTRAST:  75 cc Isovue 370 COMPARISON:  12/17/2013 CT head FINDINGS: CT HEAD FINDINGS Brain: No evidence of acute infarction, hemorrhage, hydrocephalus, extra-axial collection or mass lesion/mass effect. Mild chronic microvascular ischemic changes without significant interval change. Vascular: See below. Skull: Normal. Negative for fracture or focal lesion. Sinuses: Persistent  right mastoid opacification. Otherwise visualized paranasal sinuses and mastoid air cells are normally aerated. Bilateral intra-ocular lens replacement. Orbits: No acute finding. Review of the MIP images confirms the above findings CTA NECK FINDINGS Aortic arch: Standard branching. Imaged portion shows no evidence of aneurysm or dissection. No significant stenosis of the major arch vessel origins. Right carotid system: No evidence of dissection, stenosis (50% or greater) or occlusion. Mild calcific atherosclerosis of the carotid bifurcation without significant stenosis. Left carotid system: No evidence of dissection, stenosis (50% or greater) or occlusion. Mixed plaque of left carotid bifurcation and proximal ICA with mild 30% stenosis. Vertebral arteries: Right dominant. No evidence of dissection, stenosis (50% or greater) or occlusion. Skeleton: Bones are unremarkable. Other neck: No mass or inflammatory process identified. Upper chest: 2 mm right upper lobe calcified granuloma (series 8, image 16). Review of the MIP images confirms the above findings CTA HEAD FINDINGS Anterior circulation: No proximal vessel occlusion, aneurysm, or vascular malformation. Calcific atherosclerosis of cavernous and paraclinoid segments of internal carotid arteries with mild-to-moderate stenosis of the paraclinoid segments. Carotid  canal dehiscence bilaterally in the middle ear cavities. Posterior circulation: No significant stenosis, proximal occlusion, aneurysm, or vascular malformation. Venous sinuses: As permitted by contrast timing, patent. Anatomic variants: Anterior communicating artery and right posterior communicating arteries are patent. No left posterior communicating artery identified, likely hypoplastic or absent. Delayed phase: No abnormal intracranial enhancement. Review of the MIP images confirms the above findings IMPRESSION: 1. No evidence of dissection, hemodynamically significant stenosis, or aneurysm of the carotid and vertebral arteries of the neck. 2. Mild right carotid bifurcation calcific atherosclerosis without significant stenosis. 3. Mixed left carotid bifurcation and proximal ICA plaque with 30% stenosis. 4. No proximal vessel occlusion, aneurysm, or vascular malformation of the circle of Willis. 5. Calcific atherosclerosis of carotid siphons with mild-to-moderate stenosis of paraclinoid segments bilaterally. 6. 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 Electronically Signed   By: Kristine Garbe M.D.   On: 09/22/2016 18:43   Dg Chest 2 View  Result Date: 09/22/2016 CLINICAL DATA:  Patient reports "heaviness" to the center of her chest intermittently today and weakness that has been increasing over the past couple of weeks. Patient reports she is being treated for a UTI and patient is complaining of "heari.*comment was truncated* EXAM: CHEST  2 VIEW COMPARISON:  [id number] FINDINGS: Normal mediastinum and cardiac silhouette. Normal pulmonary vasculature. No evidence of effusion, infiltrate, or pneumothorax. No acute bony abnormality. IMPRESSION: No acute cardiopulmonary process. Electronically Signed   By: Suzy Bouchard M.D.   On: 09/22/2016 17:09   Ct Angio Neck W And/or Wo Contrast  Result Date: 09/22/2016 CLINICAL DATA:  60 y/o F; high blood pressure, weakness, and  feels/hears pulse in head. EXAM: CT ANGIOGRAPHY HEAD AND NECK TECHNIQUE: Multidetector CT imaging of the head and neck was performed using the standard protocol during bolus administration of intravenous contrast. Multiplanar CT image reconstructions and MIPs were obtained to evaluate the vascular anatomy. Carotid stenosis measurements (when applicable) are obtained utilizing NASCET criteria, using the distal internal carotid diameter as the denominator. CONTRAST:  75 cc Isovue 370 COMPARISON:  12/17/2013 CT head FINDINGS: CT HEAD FINDINGS Brain: No evidence of acute infarction, hemorrhage, hydrocephalus, extra-axial collection or mass lesion/mass effect. Mild chronic microvascular ischemic changes without significant interval change. Vascular: See below. Skull: Normal. Negative for fracture or focal lesion. Sinuses: Persistent right mastoid opacification. Otherwise visualized paranasal sinuses and mastoid air cells are normally aerated. Bilateral intra-ocular lens replacement.  Orbits: No acute finding. Review of the MIP images confirms the above findings CTA NECK FINDINGS Aortic arch: Standard branching. Imaged portion shows no evidence of aneurysm or dissection. No significant stenosis of the major arch vessel origins. Right carotid system: No evidence of dissection, stenosis (50% or greater) or occlusion. Mild calcific atherosclerosis of the carotid bifurcation without significant stenosis. Left carotid system: No evidence of dissection, stenosis (50% or greater) or occlusion. Mixed plaque of left carotid bifurcation and proximal ICA with mild 30% stenosis. Vertebral arteries: Right dominant. No evidence of dissection, stenosis (50% or greater) or occlusion. Skeleton: Bones are unremarkable. Other neck: No mass or inflammatory process identified. Upper chest: 2 mm right upper lobe calcified granuloma (series 8, image 16). Review of the MIP images confirms the above findings CTA HEAD FINDINGS Anterior circulation:  No proximal vessel occlusion, aneurysm, or vascular malformation. Calcific atherosclerosis of cavernous and paraclinoid segments of internal carotid arteries with mild-to-moderate stenosis of the paraclinoid segments. Carotid canal dehiscence bilaterally in the middle ear cavities. Posterior circulation: No significant stenosis, proximal occlusion, aneurysm, or vascular malformation. Venous sinuses: As permitted by contrast timing, patent. Anatomic variants: Anterior communicating artery and right posterior communicating arteries are patent. No left posterior communicating artery identified, likely hypoplastic or absent. Delayed phase: No abnormal intracranial enhancement. Review of the MIP images confirms the above findings IMPRESSION: 1. No evidence of dissection, hemodynamically significant stenosis, or aneurysm of the carotid and vertebral arteries of the neck. 2. Mild right carotid bifurcation calcific atherosclerosis without significant stenosis. 3. Mixed left carotid bifurcation and proximal ICA plaque with 30% stenosis. 4. No proximal vessel occlusion, aneurysm, or vascular malformation of the circle of Willis. 5. Calcific atherosclerosis of carotid siphons with mild-to-moderate stenosis of paraclinoid segments bilaterally. 6. 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 Electronically Signed   By: Kristine Garbe M.D.   On: 09/22/2016 18:43       Assessment & Plan:   Problem List Items Addressed This Visit    CAD (coronary artery disease)    Followed by cardiology.  Was going to rehab.  Feeling weak again.  Just evaluated by cardiology.  See above for recommendations.        Relevant Medications   verapamil (CALAN) 40 MG tablet   Essential hypertension    Blood pressure significantly elevated on initial check.  Rechecked by me significantly improved.  Has the coreg.  Takes an additional verapamil if blood pressure elevated (as instructed by cardiology).  Cardiology  is working on scheduling a nephrology appt for evaluation of her fluctuation blood pressure.  Follow metabolic panel.        Relevant Medications   verapamil (CALAN) 40 MG tablet   Hypercholesterolemia    Low cholesterol diet and exercise.  Follow lipid panel.  On crestor.        Relevant Medications   verapamil (CALAN) 40 MG tablet   Stress    Increased stress and anxiety as outlined.  Discussed with her today.  Increase the lexapro dosing.  Follow.        Weakness    Had improved.  With the recent fluctuation of her blood pressure, she has started noticing feeling weak again.  Would like to get her back in rehab.  Saw cardiology.  Recommended myoview and changing medication.  Follow.            Einar Pheasant, MD

## 2016-10-08 ENCOUNTER — Telehealth: Payer: Self-pay | Admitting: *Deleted

## 2016-10-08 NOTE — Telephone Encounter (Signed)
Patient states her blood pressure is continuing to increase and she is taking the verapamil but does not want to keep taking this she would like to add something to her blood pressure medicine to help it from increasing. Patient states it could be due to her anxiety and was wondering if she should increase the lexapro. Patient was taking 1/4 tab of lexapro at her last appointment last week and per Dr.Scott was told to increase this to 1/2 tab as it was prescribed before. Patient states she increased it to a whole tab after last office visit. Patient is worried and does not want to go to the ER for the 4th time. Informed patient to contact cardiology which she states she has, and I have advised patient that I have contacted them as well. Waiting for nurse to call back. Informed patient that if her blood pressure continuous to increase she should go back to ER or call cardiology again.

## 2016-10-08 NOTE — Telephone Encounter (Signed)
Tarus from [facility] [place] [facility] requested a return call in ref to pt. Tarus stated that she missed a call from [place]

## 2016-10-08 NOTE — Telephone Encounter (Signed)
Patient notified that cardiologist nurse will be contacting her today.

## 2016-10-08 NOTE — Telephone Encounter (Signed)
Pt has requested a call in ref to increasing her escitalopram Pt contact [phone]

## 2016-10-08 NOTE — Telephone Encounter (Incomplete)
Patient states her blood pressure has still been

## 2016-10-09 ENCOUNTER — Telehealth: Payer: Self-pay

## 2016-10-09 NOTE — Telephone Encounter (Signed)
Morgan Moore reports having been in the hospital 3 times recently.  Her BP was very high and they are changing her meds to get it under control.  She will call if shes cleared to return or staff will follow up in 2 weeks.

## 2016-10-11 ENCOUNTER — Encounter: Payer: Self-pay | Admitting: Internal Medicine

## 2016-10-11 NOTE — Assessment & Plan Note (Signed)
Had improved.  With the recent fluctuation of her blood pressure, she has started noticing feeling weak again.  Would like to get her back in rehab.  Saw cardiology.  Recommended myoview and changing medication.  Follow.

## 2016-10-11 NOTE — Assessment & Plan Note (Signed)
Followed by cardiology.  Was going to rehab.  Feeling weak again.  Just evaluated by cardiology.  See above for recommendations.

## 2016-10-11 NOTE — Assessment & Plan Note (Signed)
Low cholesterol diet and exercise.  Follow lipid panel.  On crestor.

## 2016-10-11 NOTE — Assessment & Plan Note (Signed)
Increased stress and anxiety as outlined.  Discussed with her today.  Increase the lexapro dosing.  Follow.

## 2016-10-11 NOTE — Assessment & Plan Note (Signed)
Blood pressure significantly elevated on initial check.  Rechecked by me significantly improved.  Has the coreg.  Takes an additional verapamil if blood pressure elevated (as instructed by cardiology).  Cardiology is working on scheduling a nephrology appt for evaluation of her fluctuation blood pressure.  Follow metabolic panel.

## 2016-10-14 ENCOUNTER — Encounter: Payer: Self-pay | Admitting: *Deleted

## 2016-10-14 DIAGNOSIS — I214 Non-ST elevation (NSTEMI) myocardial infarction: Secondary | ICD-10-CM

## 2016-10-14 DIAGNOSIS — Z955 Presence of coronary angioplasty implant and graft: Secondary | ICD-10-CM

## 2016-10-14 NOTE — Progress Notes (Signed)
Cardiac Individual Treatment Plan  Patient Details  Name: Morgan Moore MRN: [id number] Date of Birth: 07/04/1956 Referring Provider:   Flowsheet Row Cardiac Rehab from 07/06/2016 in [facility] Cardiac and Pulmonary Rehab  Referring Provider  Lujean Amel MD      Initial Encounter Date:  Flowsheet Row Cardiac Rehab from 07/06/2016 in [facility] Cardiac and Pulmonary Rehab  Date  07/06/16  Referring Provider  Lujean Amel MD      Visit Diagnosis: NSTEMI (non-ST elevated myocardial infarction) [facility])  Status post coronary artery stent placement  Patient's Home Medications on Admission:  Current Outpatient Prescriptions:  .  aspirin EC 81 MG tablet, Take 0.81 mg by mouth daily., Disp: , Rfl:  .  carvedilol (COREG) 6.25 MG tablet, Take 6.25 mg by mouth 2 (two) times daily. 1.5 every 12 hours, Disp: , Rfl:  .  clopidogrel (PLAVIX) 75 MG tablet, Take 75 mg by mouth daily., Disp: , Rfl:  .  EPINEPHrine 0.3 mg/0.3 mL IJ SOAJ injection, 0.3 mg as needed., Disp: , Rfl:  .  escitalopram (LEXAPRO) 20 MG tablet, 10 mg., Disp: , Rfl:  .  fexofenadine (ALLEGRA) 180 MG tablet, Take 180 mg by mouth daily., Disp: , Rfl:  .  fosfomycin (MONUROL) 3 g PACK, Take 3 g by mouth once., Disp: , Rfl:  .  Mesalamine (DELZICOL) 400 MG CPDR DR capsule, Take 6 capsules (2,400 mg total) by mouth daily., Disp: 180 capsule, Rfl: 2 .  omeprazole (PRILOSEC) 40 MG capsule, Take 40 mg by mouth daily. Reported on 04/13/2016, Disp: , Rfl:  .  ondansetron (ZOFRAN ODT) 4 MG disintegrating tablet, Take 1 tablet (4 mg total) by mouth every 8 (eight) hours as needed for nausea or vomiting., Disp: 20 tablet, Rfl: 0 .  rizatriptan (MAXALT) 5 MG tablet, USE AS DIRECTED, Disp: 10 tablet, Rfl: 0 .  rosuvastatin (CRESTOR) 5 MG tablet, Take 1 tablet (5 mg total) by mouth daily., Disp: 30 tablet, Rfl: 1 .  tobramycin-dexamethasone (TOBRADEX) ophthalmic solution, Place 1 drop into the right eye every 6 (six) hours., Disp: 5 mL, Rfl:  0 .  verapamil (CALAN) 40 MG tablet, Take by mouth., Disp: , Rfl:   Past Medical History: Past Medical History:  Diagnosis Date  . Allergy   . Chicken pox   . Coronary artery disease   . Heart murmur   . Hyperlipidemia   . Hypertension   . Migraines    hormonal, puberty  . Nephrolithiasis   . Phlebitis     Tobacco Use: History  Smoking Status  . Never Smoker  Smokeless Tobacco  . Never Used    Labs: Recent Review Flowsheet Data    Labs for ITP Cardiac and Pulmonary Rehab Latest Ref Rng & Units 09/24/2014 02/01/2015 11/07/2015 04/09/2016   Cholestrol 0 - 200 mg/dL 191 249(A) 254(H) 258(H)   LDLCALC 0 - 99 mg/dL 107(H) 168 169(H) 178(H)   HDL >39.00 mg/dL 50 49 56.50 54.20   Trlycerides 0.0 - 149.0 mg/dL 172 198(A) 142.0 131.0       Exercise Target Goals:    Exercise Program Goal: Individual exercise prescription set with THRR, safety & activity barriers. Participant demonstrates ability to understand and report RPE using BORG scale, to self-measure pulse accurately, and to acknowledge the importance of the exercise prescription.  Exercise Prescription Goal: Starting with aerobic activity 30 plus minutes a day, 3 days per week for initial exercise prescription. Provide home exercise prescription and guidelines that participant acknowledges understanding prior to discharge.  Activity Barriers & Risk Stratification:     Activity Barriers & Cardiac Risk Stratification - 07/06/16 1409      Activity Barriers & Cardiac Risk Stratification   Activity Barriers History of Falls;Balance Concerns;Deconditioning;Muscular Weakness;Other (comment)   Comments occasional dizzy spells, r hip bone spurs      6 Minute Walk:     6 Minute Walk    Row Name 07/06/16 1404         6 Minute Walk   Phase Initial     Distance 1100 feet     Walk Time 6 minutes     # of Rest Breaks 0     MPH 2.08     METS 3.14     RPE 11     VO2 Peak 11     Symptoms No     Resting HR 78 bpm      Resting BP 128/64     Max Ex. HR 96 bpm     Max Ex. BP 158/70     2 Minute Post BP 126/64        Initial Exercise Prescription:     Initial Exercise Prescription - 07/06/16 1400      Date of Initial Exercise RX and Referring Provider   Date 07/06/16   Referring Provider Lujean Amel MD     Treadmill   MPH 1.8   Grade 0.5   Minutes 15   METs 2.5     NuStep   Level 1   Minutes 15   METs 2     Biostep-RELP   Level 1   Minutes 15   METs 2     Prescription Details   Frequency (times per week) 3   Duration Progress to 45 minutes of aerobic exercise without signs/symptoms of physical distress     Intensity   THRR 40-80% of Max Heartrate 110-143   Ratings of Perceived Exertion 11-15   Perceived Dyspnea 0-4     Progression   Progression Continue to progress workloads to maintain intensity without signs/symptoms of physical distress.     Resistance Training   Training Prescription Yes   Weight 2 lbs   Reps 10-15      Perform Capillary Blood Glucose checks as needed.  Exercise Prescription Changes:     Exercise Prescription Changes    Row Name 07/06/16 1300 07/16/16 1000 07/29/16 1400 08/12/16 1200 08/27/16 1100     Exercise Review   Progression  -  - Yes Yes Yes     Response to Exercise   Blood Pressure (Admit) 128/84 144/68 142/82 124/70 138/88   Blood Pressure (Exercise) 158/70 162/82 138/60 134/70 192/78   Blood Pressure (Exit) 126/64 138/82 132/72 144/82 148/76   Heart Rate (Admit) 83 bpm 73 bpm 65 bpm 72 bpm 88 bpm   Heart Rate (Exercise) 96 bpm 97 bpm 99 bpm 90 bpm 99 bpm   Heart Rate (Exit) 78 bpm 75 bpm 89 bpm 68 bpm 72 bpm   Rating of Perceived Exertion (Exercise) _0 Duration  -  -  - Progress to 45 minutes of aerobic exercise without signs/symptoms of physical distress Progress to 45 minutes of aerobic exercise without signs/symptoms of physical distress   Intensity  -  -  - THRR unchanged THRR unchanged     Progression    Progression  -  -  - Continue to progress workloads to maintain intensity without signs/symptoms of physical distress. Continue to progress workloads  to maintain intensity without signs/symptoms of physical distress.   Average METs  - 2.63 1.75 1.7 2.9     Resistance Training   Training Prescription  - Yes Yes No No   Weight  - 2 2 -  doesnt do due to back  pain  -   Reps  - 10-15 10-15  -  -     Interval Training   Interval Training  - No No No No     Treadmill   MPH  - 1  -  - 2   Grade  - 0  -  - 0.5   Minutes  - 15  -  - 15   METs  - 1.77  -  - 2.81     NuStep   Level  -  - _0 Minutes  -  - _1 METs  -  - 1.5 1.4 3     Biostep-RELP   Level  - _2 -   Minutes  - _3 -   METs  -  - 2 2  -   Row Name 09/09/16 0900 09/10/16 1200 09/24/16 1100         Exercise Review   Progression Yes  - Yes       Response to Exercise   Blood Pressure (Admit)  -  - 152/78     Blood Pressure (Exercise) 156/80  - 142/68     Blood Pressure (Exit) 148/80  - 148/82     Heart Rate (Admit) 76 bpm  - 59 bpm     Heart Rate (Exercise) 102 bpm  - 91 bpm     Heart Rate (Exit) 68 bpm  - 70 bpm     Rating of Perceived Exertion (Exercise) 12  - 12     Duration Progress to 45 minutes of aerobic exercise without signs/symptoms of physical distress  - Progress to 45 minutes of aerobic exercise without signs/symptoms of physical distress     Intensity THRR unchanged  - THRR unchanged       Progression   Progression Continue to progress workloads to maintain intensity without signs/symptoms of physical distress.  - Continue to progress workloads to maintain intensity without signs/symptoms of physical distress.     Average METs 2.2  - 2.4       Resistance Training   Training Prescription No  - No       Interval Training   Interval Training No  - No       Treadmill   MPH  -  - 2     Grade  -  - 0.5     Minutes  -  - 15     METs  -  - 2.81       NuStep   Level 3  - 3      Minutes 15  - 15     METs 2.2  - 2       Home Exercise Plan   Plans to continue exercise at  [facility] (comment)  -     Frequency  - Add 2 additional days to program exercise sessions.  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 First full day of exercise!  Morgan Moore was oriented to gym and equipment including functions, settings, policies, and procedures.  Morgan Moore's individual exercise prescription and treatment plan were reviewed.  All starting workloads were established based on the results of the 6 minute walk test done at initial orientation visit.  The plan for exercise progression was also introduced and progression will be customized based on patient's performance and goals. Morgan Moore sees her PCP today so cannot attend. She called this morning and was concerned that her back was sore aroun T8-9 where she had fractures years ago.  I advised she use less weight or no weight next time and continue to monitor her pain.  She says it was already hurting last week before she came to class. Morgan Moore has complained her heart is really racing after she leaves Cardiac Rehab. Heart rate 106 on treadmill in Cardiac Rehab only exercising at 1.79mh and 0.5% incline.I had KAndersondrink 200 cc of water. I mentioned deconditioning and KToddtold me she "was pulling brush before her heart attack." KJanitzasaid she feels part of it is due to her urinary tract infection that she has now. KJancyis very concerned that her heart "is racing for hours after she leaves Cardiac Rehab. Heart rate at the end of Cardiac Rehab was 86 after cool down and relaxation. KRheanais progressing well with exercise.   Row Name 08/12/16 1302 08/27/16 1109 09/09/16 0954 09/10/16 1232 09/24/16 1132   Exercise Comments KShuntelis  progressing well with exercise and modifying as needed for her back. KViannis progressing well with exercise. KDelthais progressing well with exercise. Home exercise was reviewed along with THR, RPE and safety precautions.  [facility] [place] KRielynndoes well with exercise when she attends.  She has been on antibiotics for UTI for an extended time that she states make her not feel good in general, and makes progressing  exercise more difficult.   Row Name 10/07/16 1354           Exercise Comments KAliannyhas not attended since 09/16/16.          Discharge Exercise Prescription (Final Exercise Prescription Changes):     Exercise Prescription Changes - 09/24/16 1100      Exercise Review   Progression Yes     Response to Exercise   Blood Pressure (Admit) 152/78   Blood Pressure (Exercise) 142/68   Blood Pressure (Exit) 148/82   Heart Rate (Admit) 59 bpm   Heart Rate (Exercise) 91 bpm   Heart Rate (Exit) 70 bpm   Rating of Perceived Exertion (Exercise) 12   Duration Progress to 45 minutes of aerobic exercise without signs/symptoms of physical distress   Intensity THRR unchanged     Progression   Progression Continue to progress workloads to maintain intensity without signs/symptoms of physical distress.   Average METs 2.4     Resistance Training   Training Prescription No     Interval Training   Interval Training No     Treadmill   MPH 2   Grade 0.5   Minutes 15   METs 2.81     NuStep   Level 3   Minutes 15   METs 2      Nutrition:  Target Goals: Understanding of nutrition guidelines, daily intake of sodium <15069m cholesterol <20062mcalories 30% from fat and 7% or less from saturated fats,  daily to have 5 or more servings of fruits and vegetables.  Biometrics:     Pre Biometrics - 07/06/16 1418      Pre Biometrics   Height _0  (1.6 m)   Weight 152 lb 8 oz (69.2 kg)   Waist Circumference 33 inches   Hip Circumference  40.5 inches   Waist to Hip Ratio 0.81 %   BMI (Calculated) 27.1   Single Leg Stand 0 seconds       Nutrition Therapy Plan and Nutrition Goals:     Nutrition Therapy & Goals - 07/06/16 1442      Intervention Plan   Intervention Prescribe, educate and counsel regarding individualized specific dietary modifications aiming towards targeted core components such as weight, hypertension, lipid management, diabetes, heart failure and other comorbidities.   Expected Outcomes Short Term Goal: Understand basic principles of dietary content, such as calories, fat, sodium, cholesterol and nutrients.;Short Term Goal: A plan has been developed with personal nutrition goals set during dietitian appointment.;Long Term Goal: Adherence to prescribed nutrition plan.      Nutrition Discharge: Rate Your Plate Scores:     Nutrition Assessments - 07/06/16 1443      Rate Your Plate Scores   Pre Score 57   Pre Score % 63 %      Nutrition Goals Re-Evaluation:   Psychosocial: Target Goals: Acknowledge presence or absence of depression, maximize coping skills, provide positive support system. Participant is able to verbalize types and ability to use techniques and skills needed for reducing stress and depression.  Initial Review & Psychosocial Screening:     Initial Psych Review & Screening - 07/06/16 1453      Family Dynamics   Concerns --  Attended couseling after daughter's death      Quality of Life Scores:     Quality of Life - 07/06/16 1438      Quality of Life Scores   Health/Function Pre 20.92 %   Socioeconomic Pre 27.92 %   Psych/Spiritual Pre 23.33 %   Family Pre 29.38 %   GLOBAL Pre 24.03 %      PHQ-9: Recent Review Flowsheet Data    Depression screen [facility] 2/9 10/05/2016 07/06/2016   Decreased Interest 0 3   Down, Depressed, Hopeless 0 0   PHQ - 2 Score 0 3   Altered sleeping - 0   Tired, decreased energy - 3   Change in appetite - 0   Feeling bad or failure about  yourself  - 0   Trouble concentrating - 0   Moving slowly or fidgety/restless - 0   Suicidal thoughts - 0   PHQ-9 Score - 6   Difficult doing work/chores - Very difficult      Psychosocial Evaluation and Intervention:     Psychosocial Evaluation - 07/15/16 1733      Psychosocial Evaluation & Interventions   Interventions Encouraged to exercise with the program and follow exercise prescription   Comments Counselor met with Ms. Tamera Punt today for initial psychosocial evaluation.  She is a 60 year old who reports "passing out" at a wedding in July due to a heart attack and resulted in surgery with (2) stents inserted.  She lives alone, but reports having a strong support system with sisters and a brother who live close by and "tons of friends" and church family who are involved in her life.  Ms. C states she just recovered from a UTI and has been feeling extremely weak since that time.  She sleeps "okay" with about (5) hours each night.  She reports to having been very sedentary since the heart attack.  Ms. C states her appetite is improving since the UTI.  She denies a history of depression or anxiety or any current symptoms and states her mood is typically positive.  Ms. C lost a daughter 5 years ago and a niece passed away this past 2023/06/21, so she has been grieving those losses.  She has a son who just got married and lives 2 hours away which brings her "great joy."  Ms. Loletha Grayer has goals to be stronger and not dizzy as much.  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   Counselor provided supportive services and will continue to follow with Ms. C throughout the course of this program.        Psychosocial Re-Evaluation:     Psychosocial Re-Evaluation    Row Name 07/24/16 1316 08/10/16 1734 08/24/16 1724         Psychosocial Re-Evaluation   Interventions Encouraged to attend Cardiac  Rehabilitation for the exercise  -  -     Comments I called Oswaldo Done to check on her. Williette said last night was a little better with her heart rate after Cardiac Rehab. I suggested to Lynore to drink a lot of water before she gets on the treadmill. Carollee said she finally felt able to drive for the first time in a long time today since before she felt too weak to drive due to her UTI etc. Aleah said her 79 years old daughter died several years ago. Her 72 year old daughter was hit by a truck on her way home from teaching school. Kynlea reported that her 75 year old niece died of a massive heart attack a couple of weeks ago and left a 61 year old and twins 61 years old.Santiago Glad said they have a bad family history of heart disease-ie her father died of a massive heart attack and Ammie reported that after the ambulance took her father to the hospital she said she her brother that she needed to take him to the hospital and she never got him there -he died of a heart attack also. Cerra said she divorced her husband. 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 Follow up with Ms. Garbett today reporting had another drug reaction last Friday and hopefully the new drug for blood pressure is going to help with decreasing her light headedness and have no negative side effects.  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  This is a huge loss for her.  Counselor discussed leaning on her friends and her faith as ways to cope, in addition to consistent exercise.  She maintains that her mood continues to be mostly positive, but counselor is concerned about all of her losses and will continue to follow with Ms. C and provide supportive services.   Ms. Mattes reports she is feeling somewhat better and stronger lately; as she is not as  dizzy; is over the UTI finally; and has more energy in general.   She also reports she has been driving a little lately; which is encouraging.  She plans to have a Halloween party and is preparing for this; which is an annual event she does.  Counselor commended Ms. Belflower for hanging in there during all her health and work related disappointments and making the most of life currently.       Continued Psychosocial Services Needed Yes  -  -        Vocational Rehabilitation: Provide vocational rehab assistance to qualifying candidates.   Vocational Rehab Evaluation & Intervention:     Vocational Rehab - 07/06/16 1453      Initial Vocational Rehab Evaluation & Intervention   Assessment shows need for Vocational Rehabilitation No      Education: Education Goals: Education classes will be provided on a weekly basis, covering required topics. Participant will state understanding/return demonstration of topics presented.  Learning Barriers/Preferences:     Learning Barriers/Preferences - 07/06/16 1452      Learning Barriers/Preferences   Learning Barriers None   Learning Preferences None      Education Topics: General Nutrition Guidelines/Fats and Fiber: -Group instruction provided by verbal, written material, models and posters to present the general guidelines for heart healthy nutrition. Gives an explanation and review of dietary fats and fiber. Flowsheet Row Cardiac Rehab from 09/14/2016 in [facility] Cardiac and Pulmonary Rehab  Date  07/27/16  Educator  P. Leonel Ramsay, [place]  Instruction Review Code  2- meets goals/outcomes      Controlling Sodium/Reading Food Labels: -Group verbal and written material supporting the discussion of sodium use in heart healthy nutrition. Review and explanation with models, verbal and written materials for utilization of the food label.   Exercise Physiology & Risk Factors: - Group verbal and written instruction with models to review the exercise  physiology of the cardiovascular system and associated critical values. Details cardiovascular disease risk factors and the goals associated with each risk factor. Flowsheet Row Cardiac Rehab from 09/14/2016 in [facility] Cardiac and Pulmonary Rehab  Date  08/10/16  Educator  [facility]  Instruction Review Code  2- meets goals/outcomes      Aerobic Exercise & Resistance Training: - Gives group verbal and written discussion on the health impact of inactivity. On the components of aerobic and resistive training programs and the benefits of this training and how to safely progress through these programs. Flowsheet Row Cardiac Rehab from 09/14/2016 in [facility] Cardiac and Pulmonary Rehab  Date  08/12/16  Educator  AS  Instruction Review Code  2- meets goals/outcomes      Flexibility, Balance, General Exercise Guidelines: - Provides group verbal and written instruction on the benefits of flexibility and balance training programs. Provides general exercise guidelines with specific guidelines to those with heart or lung disease. Demonstration and skill practice provided. Flowsheet Row Cardiac Rehab from 09/14/2016 in [facility] Cardiac and Pulmonary Rehab  Date  08/17/16  Educator  [facility]  Instruction Review Code  2- meets goals/outcomes      Stress Management: - Provides group verbal and written instruction about the health risks of elevated stress, cause of high stress, and healthy ways to reduce stress. Flowsheet Row Cardiac Rehab from 09/14/2016 in [facility] Cardiac and Pulmonary Rehab  Date  08/26/16  Educator  [facility]  Instruction Review Code  2- meets goals/outcomes      Depression: - Provides group verbal and written instruction on the correlation between heart/lung disease and depressed mood, treatment options, and the stigmas associated with seeking treatment. Flowsheet Row Cardiac Rehab from 09/14/2016 in [facility] Cardiac and Pulmonary Rehab  Date  07/29/16  Educator  [place]  Instruction Review Code  2- meets  goals/outcomes      Anatomy & Physiology of the Heart: - Group verbal and written instruction and models provide basic cardiac anatomy and physiology, with the coronary electrical and arterial systems. Review of: AMI, Angina, Valve disease, Heart Failure, Cardiac Arrhythmia, Pacemakers, and the ICD. Flowsheet Row Cardiac Rehab from 09/14/2016 in [facility] Cardiac and Pulmonary Rehab  Date  08/24/16  Educator  CE  Instruction Review Code  2- meets goals/outcomes      Cardiac Procedures: - Group verbal and written instruction and models to describe the testing methods done to diagnose heart disease. Reviews the outcomes of the test results. Describes the treatment choices: Medical Management, Angioplasty, or Coronary Bypass Surgery. Flowsheet Row Cardiac Rehab from 09/14/2016 in [facility] Cardiac and Pulmonary Rehab  Date  08/31/16  Educator  CE  Instruction Review Code  2- meets goals/outcomes      Cardiac Medications: - Group verbal and written instruction to review commonly prescribed medications for heart disease. Reviews the medication, class of the drug, and side effects. Includes the steps to properly store meds and maintain the prescription regimen. Flowsheet Row Cardiac Rehab from 09/14/2016 in [facility] Cardiac and Pulmonary Rehab  Date  09/07/16  Educator  SB  Instruction Review Code  2- meets goals/outcomes      Go Sex-Intimacy & Heart Disease, Get SMART - Goal Setting: - Group verbal and written instruction through game format to discuss heart disease and the return to sexual intimacy. Provides group verbal and written material to discuss and apply goal setting through the application of the S.M.A.R.T. Method. Flowsheet Row Cardiac Rehab from 09/14/2016 in [facility] Cardiac and Pulmonary Rehab  Date  08/31/16  Educator  CE  Instruction Review Code  2- meets goals/outcomes      Other Matters of the Heart: - Provides group verbal, written materials and models to describe Heart Failure,  Angina, Valve Disease, and Diabetes in the realm of heart disease. Includes description of the disease process and treatment options available to the cardiac patient. Flowsheet Row Cardiac Rehab from 09/14/2016 in [facility] Cardiac and Pulmonary Rehab  Date  08/24/16  Educator  CE  Instruction Review Code  2- meets goals/outcomes      Exercise & Equipment Safety: - Individual verbal instruction and demonstration of equipment use and safety with use of the equipment. Flowsheet Row Cardiac Rehab from 09/14/2016 in [facility] Cardiac and Pulmonary Rehab  Date  07/06/16  Educator   SB  Instruction Review Code  2- meets goals/outcomes      Infection Prevention: - Provides verbal and written material to individual with discussion of infection control including proper hand washing and proper equipment cleaning during exercise session. Flowsheet Row Cardiac Rehab from 09/14/2016 in [facility]  Date  07/06/16  Educator  SB  Instruction Review Code  2- meets goals/outcomes      Falls Prevention: - Provides verbal and written material to individual with discussion of falls prevention and safety. Flowsheet Row Cardiac Rehab from 09/14/2016 in [facility]  Date  07/06/16  Educator  SB  Instruction Review Code  2- meets goals/outcomes      Diabetes: - Individual verbal and written instruction to review signs/symptoms of diabetes, desired ranges of glucose level fasting, after meals and with exercise. Advice that pre and post exercise glucose checks will be done for 3 sessions at entry of program.    Knowledge Questionnaire Score:  Knowledge Questionnaire Score - 07/06/16 1452      Knowledge Questionnaire Score   Pre Score 23/28      Core Components/Risk Factors/Patient Goals at Admission:     Personal Goals and Risk Factors at Admission - 07/06/16 1449      Core Components/Risk Factors/Patient Goals on Admission   Sedentary Yes   Intervention  Provide advice, education, support and counseling about physical activity/exercise needs.;Develop an individualized exercise prescription for aerobic and resistive training based on initial evaluation findings, risk stratification, comorbidities and participant's personal goals.   Expected Outcomes Achievement of increased cardiorespiratory fitness and enhanced flexibility, muscular endurance and strength shown through measurements of functional capacity and personal statement of participant.   Increase Strength and Stamina Yes   Intervention Provide advice, education, support and counseling about physical activity/exercise needs.;Develop an individualized exercise prescription for aerobic and resistive training based on initial evaluation findings, risk stratification, comorbidities and participant's personal goals.   Expected Outcomes Achievement of increased cardiorespiratory fitness and enhanced flexibility, muscular endurance and strength shown through measurements of functional capacity and personal statement of participant.   Hypertension Yes   Intervention Provide education on lifestyle modifcations including regular physical activity/exercise, weight management, moderate sodium restriction and increased consumption of fresh fruit, vegetables, and low fat dairy, alcohol moderation, and smoking cessation.;Monitor prescription use compliance.   Expected Outcomes Short Term: Continued assessment and intervention until BP is < 140/58m HG in hypertensive participants. < 130/823mHG in hypertensive participants with diabetes, heart failure or chronic kidney disease.;Long Term: Maintenance of blood pressure at goal levels.   Lipids Yes   Intervention Provide education and support for participant on nutrition & aerobic/resistive exercise along with prescribed medications to achieve LDL <7054mHDL >52m34m Expected Outcomes Short Term: Participant states understanding of desired cholesterol values and is  compliant with medications prescribed. Participant is following exercise prescription and nutrition guidelines.;Long Term: Cholesterol controlled with medications as prescribed, with individualized exercise RX and with personalized nutrition plan. Value goals: LDL < 70mg57mL > 40 mg.      Core Components/Risk Factors/Patient Goals Review:      Goals and Risk Factor Review    Row Name 07/24/16 1222 08/12/16 1736 09/10/16 1228         Core Components/Risk Factors/Patient Goals Review   Personal Goals Review  - Sedentary;Increase Strength and Stamina;Hypertension;Lipids;Stress;Weight Management/Obesity Lipids;Increase Strength and Stamina     Review Morgan Moore her heart is really racing after she leaves Cardiac Rehab. Heart rate 106 on treadmill in Cardiac Rehab only exercising at 1.8mph 89m 0.5% incline.I had Morgan Moore Janayla 200 cc of water. I mentioned deconditioning and Morgan Moore she "was pulling brush before her heart attack." Morgan Moore feels part of it is due to her urinary tract infection that she has now. Morgan Moore concerned that her heart "is racing for hours after she leaves Cardiac Rehab. Heart rate at the end of Cardiac Rehab was 86 after cool down and relaxation. Morgan Moore weight has started to creep up as she is taking her rides to rehab out to dinner.  She is still having issues with her UTI, Chron's, and bad drug reactions.  She is still coping with a lot of stress and recently was told that they would not be holding her job after 10/26.  Will let counselor know so that she can follow up as well.  She has noted that exercise is starting to help her feel better as she is  able to do more at home than previously.  She is still concerned that she cannot do as much as before, but reasssured that she is still in the recovery phase and fighting other infections.  She has not had her labs tested recently.  Her blood pressure was good today, but she continues to have episodes of  low pressure and is wearing an event monitor. Morgan Moore reports being able to vaccum, mop and wash window with less fatigue than before.  She is pleased to be able to do these chores.  She is not taking cholesterol medication now on Dr orders due to other health issues.     Expected Outcomes Quality exercise/conditioned. Morgan Moore will continue to come to exercise and education classes to work on risk factor modification and for support.  We will continue to monitor. Morgan Moore will continue to build strength and endurance and be able to complete ADLs with more ease.  She will follow up with her Dr about cholesterol.          Core Components/Risk Factors/Patient Goals at Discharge (Final Review):      Goals and Risk Factor Review - 09/10/16 1228      Core Components/Risk Factors/Patient Goals Review   Personal Goals Review Lipids;Increase Strength and Stamina   Review Ryliegh reports being able to vaccum, mop and wash window with less fatigue than before.  She is pleased to be able to do these chores.  She is not taking cholesterol medication now on Dr orders due to other health issues.   Expected Outcomes Morgan Moore will continue to build strength and endurance and be able to complete ADLs with more ease.  She will follow up with her Dr about cholesterol.        ITP Comments:     ITP Comments    Row Name 07/06/16 1438 07/15/16 1746 07/16/16 1035 07/22/16 0654 07/23/16 1648   ITP Comments Initial ITP created during [facility] Review. Diagnosis documentation can be found [facility] encounter 06/16/2016 First full day of exercise!  Emer was oriented to gym and equipment including functions, settings, policies, and procedures.  Neyla's individual exercise prescription and treatment plan were reviewed.  All starting workloads were established based on the results of the 6 minute walk test done at initial orientation visit.  The plan for exercise progression was also introduced and progression will be customized  based on patient's performance and goals. Korrina sees her PCP today so cannot attend. She called this morning and was concerned that her back was sore aroun T8-9 where she had fractures years ago.  I advised she use less weight or no weight next time and continue to monitor her pain.  She says it was already hurting last week before she came to class. 30 day review. Continue with ITP unless changes noted by Medical Director at signature of review.  Has started program this week Arlis instructed to contact her doctor's office since she c/o of her heart rate going fast in the evening when she leaves here. Zaila reported she is still on 111m Verapamil. KIveyreported that tha MD increased her verapamil to 1628mbut decreased it when she c/o being tired. KaLyvonnen treadmill at 1.46m49mheart rate 100 even after drinking  240 cc water.    RowFair Havenme 07/24/16 1222 07/24/16 1315 07/31/16 1219 07/31/16 1220 08/03/16 1628   ITP Comments I sent a note via [facility] to Dr. CalClayborn Bignessday-[place] has complained her heart is really racing after she leaves  Cardiac Rehab. Heart rate 106 on treadmill in Cardiac Rehab only exercising at 1.62mh and 0.5% incline.I had KOonadrink 200 cc of water. I mentioned deconditioning and KKalintold me she "was pulling brush before her heart attack." KDerikasaid she feels part of it is due to her urinary tract infection that she has now. KKristienis very concerned that her heart "is racing for hours after she leaves Cardiac Rehab. Heart rate at the end of Cardiac Rehab was 86 after cool down and relaxation. I called KJaunita Mikelsto check on her. KLavellsaid last night was a little better with her heart rate after Cardiac Rehab. I suggested to KFergieto drink a lot of water before she gets on the treadmill. KKaedancesaid she finally felt able to drive for the first time in a long time today since before she felt too weak to drive due to her UTI etc. KIzelasaid her 2109years old daughter died several years ago.  Her 253year old daughter was hit by a truck on her way home from teaching school. KJuelreported that her 438year old niece died of a massive heart attack a couple of weeks ago and left a 61year old and twins 61years old..Santiago Gladsaid they have a bad family history of heart disease-ie her father died of a massive heart attack and KJacklynreported that after the ambulance took her father to the hospital she said she her brother that she needed to take him to the hospital and she never got him there -he died of a heart attack also. KYaniquesaid she divorced her husband. 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 KGiannyleft a vm on Cardiac Rehab phone and said that she did drive her on [address]for Cardiac REhab but she felt so weak that she didn't think she should do Cardiac Rehab. KGarnellis still struggling with feeling weak which keeps her for exercising.  KJunitacalled [facility]he [facility]  She also stated that she is being evaluated for a leaking valve.    Row Name 08/17/16 1825 08/19/16 [id number]/[id number]/17 0620 10/07/16 1354 10/14/16 0615   ITP Comments KMozeldrove to class for the first time today. She is feeling better, now that the UTI she has had over a month is better.  30 day review. Continue with ITP unless changes noted by Medical Director at signature of review. HAs been out medical concerns. Returning feeling better. 30 day review completed for Medical Director physician review and signature. Continue ITP unless changes made by physician. KLexiahas not attended since 09/16/16. 30 day review completed for review by Dr MEmily Filbert  Continue with ITP unless changes noted by Dr MSabra Heck   RPachecoName 10/14/16 [phone]          ITP Comments KRouxhas been out with medial concerns and hopes to return once cleared by MD          Comments:

## 2016-10-22 ENCOUNTER — Telehealth: Payer: Self-pay

## 2016-10-22 NOTE — Telephone Encounter (Signed)
Morgan Moore saw nephrologist and has a scan on 12/27 to see if she has a blockage.   Her BP meds are still being adjusted and she is not having high readings.  Kidney function is perfect and she will get clearance to return  to [facility] before returning.  She had a nuclear stress test a couple weeks ago.

## 2016-11-04 ENCOUNTER — Encounter (INDEPENDENT_AMBULATORY_CARE_PROVIDER_SITE_OTHER): Payer: Self-pay

## 2016-11-04 ENCOUNTER — Other Ambulatory Visit (INDEPENDENT_AMBULATORY_CARE_PROVIDER_SITE_OTHER): Payer: Self-pay | Admitting: Nephrology

## 2016-11-04 ENCOUNTER — Encounter (INDEPENDENT_AMBULATORY_CARE_PROVIDER_SITE_OTHER): Payer: BC Managed Care – PPO

## 2016-11-04 ENCOUNTER — Other Ambulatory Visit (INDEPENDENT_AMBULATORY_CARE_PROVIDER_SITE_OTHER): Payer: Self-pay

## 2016-11-04 DIAGNOSIS — R809 Proteinuria, unspecified: Secondary | ICD-10-CM

## 2016-11-04 DIAGNOSIS — E7800 Pure hypercholesterolemia, unspecified: Secondary | ICD-10-CM

## 2016-11-04 DIAGNOSIS — I10 Essential (primary) hypertension: Secondary | ICD-10-CM

## 2016-11-10 ENCOUNTER — Encounter: Payer: Self-pay | Admitting: *Deleted

## 2016-11-10 DIAGNOSIS — Z955 Presence of coronary angioplasty implant and graft: Secondary | ICD-10-CM

## 2016-11-10 DIAGNOSIS — I214 Non-ST elevation (NSTEMI) myocardial infarction: Secondary | ICD-10-CM

## 2016-11-10 NOTE — Progress Notes (Signed)
Cardiac Individual Treatment Plan  Patient Details  Name: Morgan Moore MRN: [id number] Date of Birth: 07/04/1956 Referring Provider:   Flowsheet Row Cardiac Rehab from 07/06/2016 in [facility] Cardiac and Pulmonary Rehab  Referring Provider  Lujean Amel MD      Initial Encounter Date:  Flowsheet Row Cardiac Rehab from 07/06/2016 in [facility] Cardiac and Pulmonary Rehab  Date  07/06/16  Referring Provider  Lujean Amel MD      Visit Diagnosis: NSTEMI (non-ST elevated myocardial infarction) [facility])  Status post coronary artery stent placement  Patient's Home Medications on Admission:  Current Outpatient Prescriptions:  .  aspirin EC 81 MG tablet, Take 0.81 mg by mouth daily., Disp: , Rfl:  .  carvedilol (COREG) 6.25 MG tablet, Take 6.25 mg by mouth 2 (two) times daily. 1.5 every 12 hours, Disp: , Rfl:  .  clopidogrel (PLAVIX) 75 MG tablet, Take 75 mg by mouth daily., Disp: , Rfl:  .  EPINEPHrine 0.3 mg/0.3 mL IJ SOAJ injection, 0.3 mg as needed., Disp: , Rfl:  .  escitalopram (LEXAPRO) 20 MG tablet, 10 mg., Disp: , Rfl:  .  fexofenadine (ALLEGRA) 180 MG tablet, Take 180 mg by mouth daily., Disp: , Rfl:  .  fosfomycin (MONUROL) 3 g PACK, Take 3 g by mouth once., Disp: , Rfl:  .  Mesalamine (DELZICOL) 400 MG CPDR DR capsule, Take 6 capsules (2,400 mg total) by mouth daily., Disp: 180 capsule, Rfl: 2 .  omeprazole (PRILOSEC) 40 MG capsule, Take 40 mg by mouth daily. Reported on 04/13/2016, Disp: , Rfl:  .  ondansetron (ZOFRAN ODT) 4 MG disintegrating tablet, Take 1 tablet (4 mg total) by mouth every 8 (eight) hours as needed for nausea or vomiting., Disp: 20 tablet, Rfl: 0 .  rizatriptan (MAXALT) 5 MG tablet, USE AS DIRECTED, Disp: 10 tablet, Rfl: 0 .  rosuvastatin (CRESTOR) 5 MG tablet, Take 1 tablet (5 mg total) by mouth daily., Disp: 30 tablet, Rfl: 1 .  tobramycin-dexamethasone (TOBRADEX) ophthalmic solution, Place 1 drop into the right eye every 6 (six) hours., Disp: 5 mL, Rfl:  0 .  verapamil (CALAN) 40 MG tablet, Take by mouth., Disp: , Rfl:   Past Medical History: Past Medical History:  Diagnosis Date  . Allergy   . Chicken pox   . Coronary artery disease   . Heart murmur   . Hyperlipidemia   . Hypertension   . Migraines    hormonal, puberty  . Nephrolithiasis   . Phlebitis     Tobacco Use: History  Smoking Status  . Never Smoker  Smokeless Tobacco  . Never Used    Labs: Recent Review Flowsheet Data    Labs for ITP Cardiac and Pulmonary Rehab Latest Ref Rng & Units 09/24/2014 02/01/2015 11/07/2015 04/09/2016   Cholestrol 0 - 200 mg/dL 191 249(A) 254(H) 258(H)   LDLCALC 0 - 99 mg/dL 107(H) 168 169(H) 178(H)   HDL >39.00 mg/dL 50 49 56.50 54.20   Trlycerides 0.0 - 149.0 mg/dL 172 198(A) 142.0 131.0       Exercise Target Goals:    Exercise Program Goal: Individual exercise prescription set with THRR, safety & activity barriers. Participant demonstrates ability to understand and report RPE using BORG scale, to self-measure pulse accurately, and to acknowledge the importance of the exercise prescription.  Exercise Prescription Goal: Starting with aerobic activity 30 plus minutes a day, 3 days per week for initial exercise prescription. Provide home exercise prescription and guidelines that participant acknowledges understanding prior to discharge.  Activity Barriers & Risk Stratification:     Activity Barriers & Cardiac Risk Stratification - 07/06/16 1409      Activity Barriers & Cardiac Risk Stratification   Activity Barriers History of Falls;Balance Concerns;Deconditioning;Muscular Weakness;Other (comment)   Comments occasional dizzy spells, r hip bone spurs      6 Minute Walk:     6 Minute Walk    Row Name 07/06/16 1404         6 Minute Walk   Phase Initial     Distance 1100 feet     Walk Time 6 minutes     # of Rest Breaks 0     MPH 2.08     METS 3.14     RPE 11     VO2 Peak 11     Symptoms No     Resting HR 78 bpm      Resting BP 128/64     Max Ex. HR 96 bpm     Max Ex. BP 158/70     2 Minute Post BP 126/64        Initial Exercise Prescription:     Initial Exercise Prescription - 07/06/16 1400      Date of Initial Exercise RX and Referring Provider   Date 07/06/16   Referring Provider Lujean Amel MD     Treadmill   MPH 1.8   Grade 0.5   Minutes 15   METs 2.5     NuStep   Level 1   Minutes 15   METs 2     Biostep-RELP   Level 1   Minutes 15   METs 2     Prescription Details   Frequency (times per week) 3   Duration Progress to 45 minutes of aerobic exercise without signs/symptoms of physical distress     Intensity   THRR 40-80% of Max Heartrate 110-143   Ratings of Perceived Exertion 11-15   Perceived Dyspnea 0-4     Progression   Progression Continue to progress workloads to maintain intensity without signs/symptoms of physical distress.     Resistance Training   Training Prescription Yes   Weight 2 lbs   Reps 10-15      Perform Capillary Blood Glucose checks as needed.  Exercise Prescription Changes:     Exercise Prescription Changes    Row Name 07/06/16 1300 07/16/16 1000 07/29/16 1400 08/12/16 1200 08/27/16 1100     Exercise Review   Progression  -  - Yes Yes Yes     Response to Exercise   Blood Pressure (Admit) 128/84 144/68 142/82 124/70 138/88   Blood Pressure (Exercise) 158/70 162/82 138/60 134/70 192/78   Blood Pressure (Exit) 126/64 138/82 132/72 144/82 148/76   Heart Rate (Admit) 83 bpm 73 bpm 65 bpm 72 bpm 88 bpm   Heart Rate (Exercise) 96 bpm 97 bpm 99 bpm 90 bpm 99 bpm   Heart Rate (Exit) 78 bpm 75 bpm 89 bpm 68 bpm 72 bpm   Rating of Perceived Exertion (Exercise) _0 Duration  -  -  - Progress to 45 minutes of aerobic exercise without signs/symptoms of physical distress Progress to 45 minutes of aerobic exercise without signs/symptoms of physical distress   Intensity  -  -  - THRR unchanged THRR unchanged     Progression    Progression  -  -  - Continue to progress workloads to maintain intensity without signs/symptoms of physical distress. Continue to progress workloads  to maintain intensity without signs/symptoms of physical distress.   Average METs  - 2.63 1.75 1.7 2.9     Resistance Training   Training Prescription  - Yes Yes No No   Weight  - 2 2 -  doesnt do due to back  pain  -   Reps  - 10-15 10-15  -  -     Interval Training   Interval Training  - No No No No     Treadmill   MPH  - 1  -  - 2   Grade  - 0  -  - 0.5   Minutes  - 15  -  - 15   METs  - 1.77  -  - 2.81     NuStep   Level  -  - _0 Minutes  -  - _1 METs  -  - 1.5 1.4 3     Biostep-RELP   Level  - _2 -   Minutes  - _3 -   METs  -  - 2 2  -   Row Name 09/09/16 0900 09/10/16 1200 09/24/16 1100         Exercise Review   Progression Yes  - Yes       Response to Exercise   Blood Pressure (Admit)  -  - 152/78     Blood Pressure (Exercise) 156/80  - 142/68     Blood Pressure (Exit) 148/80  - 148/82     Heart Rate (Admit) 76 bpm  - 59 bpm     Heart Rate (Exercise) 102 bpm  - 91 bpm     Heart Rate (Exit) 68 bpm  - 70 bpm     Rating of Perceived Exertion (Exercise) 12  - 12     Duration Progress to 45 minutes of aerobic exercise without signs/symptoms of physical distress  - Progress to 45 minutes of aerobic exercise without signs/symptoms of physical distress     Intensity THRR unchanged  - THRR unchanged       Progression   Progression Continue to progress workloads to maintain intensity without signs/symptoms of physical distress.  - Continue to progress workloads to maintain intensity without signs/symptoms of physical distress.     Average METs 2.2  - 2.4       Resistance Training   Training Prescription No  - No       Interval Training   Interval Training No  - No       Treadmill   MPH  -  - 2     Grade  -  - 0.5     Minutes  -  - 15     METs  -  - 2.81       NuStep   Level 3  - 3      Minutes 15  - 15     METs 2.2  - 2       Home Exercise Plan   Plans to continue exercise at  [facility] (comment)  -     Frequency  - Add 2 additional days to program exercise sessions.  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 First full day of exercise!  Jakerria was oriented to gym and equipment including functions, settings, policies, and procedures.  Odean's individual exercise prescription and treatment plan were reviewed.  All starting workloads were established based on the results of the 6 minute walk test done at initial orientation visit.  The plan for exercise progression was also introduced and progression will be customized based on patient's performance and goals. Bayli sees her PCP today so cannot attend. She called this morning and was concerned that her back was sore aroun T8-9 where she had fractures years ago.  I advised she use less weight or no weight next time and continue to monitor her pain.  She says it was already hurting last week before she came to class. Latrish has complained her heart is really racing after she leaves Cardiac Rehab. Heart rate 106 on treadmill in Cardiac Rehab only exercising at 1.79mh and 0.5% incline.I had KAndersondrink 200 cc of water. I mentioned deconditioning and KToddtold me she "was pulling brush before her heart attack." KJanitzasaid she feels part of it is due to her urinary tract infection that she has now. KJancyis very concerned that her heart "is racing for hours after she leaves Cardiac Rehab. Heart rate at the end of Cardiac Rehab was 86 after cool down and relaxation. KRheanais progressing well with exercise.   Row Name 08/12/16 1302 08/27/16 1109 09/09/16 0954 09/10/16 1232 09/24/16 1132   Exercise Comments KShuntelis  progressing well with exercise and modifying as needed for her back. KViannis progressing well with exercise. KDelthais progressing well with exercise. Home exercise was reviewed along with THR, RPE and safety precautions.  [facility] [place] KRielynndoes well with exercise when she attends.  She has been on antibiotics for UTI for an extended time that she states make her not feel good in general, and makes progressing  exercise more difficult.   Row Name 10/07/16 1354           Exercise Comments KAliannyhas not attended since 09/16/16.          Discharge Exercise Prescription (Final Exercise Prescription Changes):     Exercise Prescription Changes - 09/24/16 1100      Exercise Review   Progression Yes     Response to Exercise   Blood Pressure (Admit) 152/78   Blood Pressure (Exercise) 142/68   Blood Pressure (Exit) 148/82   Heart Rate (Admit) 59 bpm   Heart Rate (Exercise) 91 bpm   Heart Rate (Exit) 70 bpm   Rating of Perceived Exertion (Exercise) 12   Duration Progress to 45 minutes of aerobic exercise without signs/symptoms of physical distress   Intensity THRR unchanged     Progression   Progression Continue to progress workloads to maintain intensity without signs/symptoms of physical distress.   Average METs 2.4     Resistance Training   Training Prescription No     Interval Training   Interval Training No     Treadmill   MPH 2   Grade 0.5   Minutes 15   METs 2.81     NuStep   Level 3   Minutes 15   METs 2      Nutrition:  Target Goals: Understanding of nutrition guidelines, daily intake of sodium <15069m cholesterol <20062mcalories 30% from fat and 7% or less from saturated fats,  daily to have 5 or more servings of fruits and vegetables.  Biometrics:     Pre Biometrics - 07/06/16 1418      Pre Biometrics   Height _0  (1.6 m)   Weight 152 lb 8 oz (69.2 kg)   Waist Circumference 33 inches   Hip Circumference  40.5 inches   Waist to Hip Ratio 0.81 %   BMI (Calculated) 27.1   Single Leg Stand 0 seconds       Nutrition Therapy Plan and Nutrition Goals:     Nutrition Therapy & Goals - 07/06/16 1442      Intervention Plan   Intervention Prescribe, educate and counsel regarding individualized specific dietary modifications aiming towards targeted core components such as weight, hypertension, lipid management, diabetes, heart failure and other comorbidities.   Expected Outcomes Short Term Goal: Understand basic principles of dietary content, such as calories, fat, sodium, cholesterol and nutrients.;Short Term Goal: A plan has been developed with personal nutrition goals set during dietitian appointment.;Long Term Goal: Adherence to prescribed nutrition plan.      Nutrition Discharge: Rate Your Plate Scores:     Nutrition Assessments - 07/06/16 1443      Rate Your Plate Scores   Pre Score 57   Pre Score % 63 %      Nutrition Goals Re-Evaluation:   Psychosocial: Target Goals: Acknowledge presence or absence of depression, maximize coping skills, provide positive support system. Participant is able to verbalize types and ability to use techniques and skills needed for reducing stress and depression.  Initial Review & Psychosocial Screening:     Initial Psych Review & Screening - 07/06/16 1453      Family Dynamics   Concerns --  Attended couseling after daughter's death      Quality of Life Scores:     Quality of Life - 07/06/16 1438      Quality of Life Scores   Health/Function Pre 20.92 %   Socioeconomic Pre 27.92 %   Psych/Spiritual Pre 23.33 %   Family Pre 29.38 %   GLOBAL Pre 24.03 %      PHQ-9: Recent Review Flowsheet Data    Depression screen [facility] 2/9 10/05/2016 07/06/2016   Decreased Interest 0 3   Down, Depressed, Hopeless 0 0   PHQ - 2 Score 0 3   Altered sleeping - 0   Tired, decreased energy - 3   Change in appetite - 0   Feeling bad or failure about  yourself  - 0   Trouble concentrating - 0   Moving slowly or fidgety/restless - 0   Suicidal thoughts - 0   PHQ-9 Score - 6   Difficult doing work/chores - Very difficult      Psychosocial Evaluation and Intervention:     Psychosocial Evaluation - 07/15/16 1733      Psychosocial Evaluation & Interventions   Interventions Encouraged to exercise with the program and follow exercise prescription   Comments Counselor met with Ms. Tamera Punt today for initial psychosocial evaluation.  She is a 61 year old who reports "passing out" at a wedding in July due to a heart attack and resulted in surgery with (2) stents inserted.  She lives alone, but reports having a strong support system with sisters and a brother who live close by and "tons of friends" and church family who are involved in her life.  Ms. C states she just recovered from a UTI and has been feeling extremely weak since that time.  She sleeps "okay" with about (5) hours each night.  She reports to having been very sedentary since the heart attack.  Ms. C states her appetite is improving since the UTI.  She denies a history of depression or anxiety or any current symptoms and states her mood is typically positive.  Ms. C lost a daughter 5 years ago and a niece passed away this past 2023/06/21, so she has been grieving those losses.  She has a son who just got married and lives 2 hours away which brings her "great joy."  Ms. Loletha Grayer has goals to be stronger and not dizzy as much.  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   Counselor provided supportive services and will continue to follow with Ms. C throughout the course of this program.        Psychosocial Re-Evaluation:     Psychosocial Re-Evaluation    Row Name 07/24/16 1316 08/10/16 1734 08/24/16 1724         Psychosocial Re-Evaluation   Interventions Encouraged to attend Cardiac  Rehabilitation for the exercise  -  -     Comments I called Oswaldo Done to check on her. Williette said last night was a little better with her heart rate after Cardiac Rehab. I suggested to Lynore to drink a lot of water before she gets on the treadmill. Carollee said she finally felt able to drive for the first time in a long time today since before she felt too weak to drive due to her UTI etc. Aleah said her 79 years old daughter died several years ago. Her 72 year old daughter was hit by a truck on her way home from teaching school. Kynlea reported that her 75 year old niece died of a massive heart attack a couple of weeks ago and left a 62 year old and twins 62 years old.Santiago Glad said they have a bad family history of heart disease-ie her father died of a massive heart attack and Ammie reported that after the ambulance took her father to the hospital she said she her brother that she needed to take him to the hospital and she never got him there -he died of a heart attack also. Cerra said she divorced her husband. 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 Follow up with Ms. Garbett today reporting had another drug reaction last Friday and hopefully the new drug for blood pressure is going to help with decreasing her light headedness and have no negative side effects.  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  This is a huge loss for her.  Counselor discussed leaning on her friends and her faith as ways to cope, in addition to consistent exercise.  She maintains that her mood continues to be mostly positive, but counselor is concerned about all of her losses and will continue to follow with Ms. C and provide supportive services.   Ms. Mattes reports she is feeling somewhat better and stronger lately; as she is not as  dizzy; is over the UTI finally; and has more energy in general.   She also reports she has been driving a little lately; which is encouraging.  She plans to have a Halloween party and is preparing for this; which is an annual event she does.  Counselor commended Ms. Belflower for hanging in there during all her health and work related disappointments and making the most of life currently.       Continued Psychosocial Services Needed Yes  -  -        Vocational Rehabilitation: Provide vocational rehab assistance to qualifying candidates.   Vocational Rehab Evaluation & Intervention:     Vocational Rehab - 07/06/16 1453      Initial Vocational Rehab Evaluation & Intervention   Assessment shows need for Vocational Rehabilitation No      Education: Education Goals: Education classes will be provided on a weekly basis, covering required topics. Participant will state understanding/return demonstration of topics presented.  Learning Barriers/Preferences:     Learning Barriers/Preferences - 07/06/16 1452      Learning Barriers/Preferences   Learning Barriers None   Learning Preferences None      Education Topics: General Nutrition Guidelines/Fats and Fiber: -Group instruction provided by verbal, written material, models and posters to present the general guidelines for heart healthy nutrition. Gives an explanation and review of dietary fats and fiber. Flowsheet Row Cardiac Rehab from 09/14/2016 in [facility] Cardiac and Pulmonary Rehab  Date  07/27/16  Educator  P. Leonel Ramsay, [place]  Instruction Review Code  2- meets goals/outcomes      Controlling Sodium/Reading Food Labels: -Group verbal and written material supporting the discussion of sodium use in heart healthy nutrition. Review and explanation with models, verbal and written materials for utilization of the food label.   Exercise Physiology & Risk Factors: - Group verbal and written instruction with models to review the exercise  physiology of the cardiovascular system and associated critical values. Details cardiovascular disease risk factors and the goals associated with each risk factor. Flowsheet Row Cardiac Rehab from 09/14/2016 in [facility] Cardiac and Pulmonary Rehab  Date  08/10/16  Educator  [facility]  Instruction Review Code  2- meets goals/outcomes      Aerobic Exercise & Resistance Training: - Gives group verbal and written discussion on the health impact of inactivity. On the components of aerobic and resistive training programs and the benefits of this training and how to safely progress through these programs. Flowsheet Row Cardiac Rehab from 09/14/2016 in [facility] Cardiac and Pulmonary Rehab  Date  08/12/16  Educator  AS  Instruction Review Code  2- meets goals/outcomes      Flexibility, Balance, General Exercise Guidelines: - Provides group verbal and written instruction on the benefits of flexibility and balance training programs. Provides general exercise guidelines with specific guidelines to those with heart or lung disease. Demonstration and skill practice provided. Flowsheet Row Cardiac Rehab from 09/14/2016 in [facility] Cardiac and Pulmonary Rehab  Date  08/17/16  Educator  [facility]  Instruction Review Code  2- meets goals/outcomes      Stress Management: - Provides group verbal and written instruction about the health risks of elevated stress, cause of high stress, and healthy ways to reduce stress. Flowsheet Row Cardiac Rehab from 09/14/2016 in [facility] Cardiac and Pulmonary Rehab  Date  08/26/16  Educator  [facility]  Instruction Review Code  2- meets goals/outcomes      Depression: - Provides group verbal and written instruction on the correlation between heart/lung disease and depressed mood, treatment options, and the stigmas associated with seeking treatment. Flowsheet Row Cardiac Rehab from 09/14/2016 in [facility] Cardiac and Pulmonary Rehab  Date  07/29/16  Educator  [place]  Instruction Review Code  2- meets  goals/outcomes      Anatomy & Physiology of the Heart: - Group verbal and written instruction and models provide basic cardiac anatomy and physiology, with the coronary electrical and arterial systems. Review of: AMI, Angina, Valve disease, Heart Failure, Cardiac Arrhythmia, Pacemakers, and the ICD. Flowsheet Row Cardiac Rehab from 09/14/2016 in [facility] Cardiac and Pulmonary Rehab  Date  08/24/16  Educator  CE  Instruction Review Code  2- meets goals/outcomes      Cardiac Procedures: - Group verbal and written instruction and models to describe the testing methods done to diagnose heart disease. Reviews the outcomes of the test results. Describes the treatment choices: Medical Management, Angioplasty, or Coronary Bypass Surgery. Flowsheet Row Cardiac Rehab from 09/14/2016 in [facility] Cardiac and Pulmonary Rehab  Date  08/31/16  Educator  CE  Instruction Review Code  2- meets goals/outcomes      Cardiac Medications: - Group verbal and written instruction to review commonly prescribed medications for heart disease. Reviews the medication, class of the drug, and side effects. Includes the steps to properly store meds and maintain the prescription regimen. Flowsheet Row Cardiac Rehab from 09/14/2016 in [facility] Cardiac and Pulmonary Rehab  Date  09/07/16  Educator  SB  Instruction Review Code  2- meets goals/outcomes      Go Sex-Intimacy & Heart Disease, Get SMART - Goal Setting: - Group verbal and written instruction through game format to discuss heart disease and the return to sexual intimacy. Provides group verbal and written material to discuss and apply goal setting through the application of the S.M.A.R.T. Method. Flowsheet Row Cardiac Rehab from 09/14/2016 in [facility] Cardiac and Pulmonary Rehab  Date  08/31/16  Educator  CE  Instruction Review Code  2- meets goals/outcomes      Other Matters of the Heart: - Provides group verbal, written materials and models to describe Heart Failure,  Angina, Valve Disease, and Diabetes in the realm of heart disease. Includes description of the disease process and treatment options available to the cardiac patient. Flowsheet Row Cardiac Rehab from 09/14/2016 in [facility] Cardiac and Pulmonary Rehab  Date  08/24/16  Educator  CE  Instruction Review Code  2- meets goals/outcomes      Exercise & Equipment Safety: - Individual verbal instruction and demonstration of equipment use and safety with use of the equipment. Flowsheet Row Cardiac Rehab from 09/14/2016 in [facility] Cardiac and Pulmonary Rehab  Date  07/06/16  Educator   SB  Instruction Review Code  2- meets goals/outcomes      Infection Prevention: - Provides verbal and written material to individual with discussion of infection control including proper hand washing and proper equipment cleaning during exercise session. Flowsheet Row Cardiac Rehab from 09/14/2016 in [facility]  Date  07/06/16  Educator  SB  Instruction Review Code  2- meets goals/outcomes      Falls Prevention: - Provides verbal and written material to individual with discussion of falls prevention and safety. Flowsheet Row Cardiac Rehab from 09/14/2016 in [facility]  Date  07/06/16  Educator  SB  Instruction Review Code  2- meets goals/outcomes      Diabetes: - Individual verbal and written instruction to review signs/symptoms of diabetes, desired ranges of glucose level fasting, after meals and with exercise. Advice that pre and post exercise glucose checks will be done for 3 sessions at entry of program.    Knowledge Questionnaire Score:  Knowledge Questionnaire Score - 07/06/16 1452      Knowledge Questionnaire Score   Pre Score 23/28      Core Components/Risk Factors/Patient Goals at Admission:     Personal Goals and Risk Factors at Admission - 07/06/16 1449      Core Components/Risk Factors/Patient Goals on Admission   Sedentary Yes   Intervention  Provide advice, education, support and counseling about physical activity/exercise needs.;Develop an individualized exercise prescription for aerobic and resistive training based on initial evaluation findings, risk stratification, comorbidities and participant's personal goals.   Expected Outcomes Achievement of increased cardiorespiratory fitness and enhanced flexibility, muscular endurance and strength shown through measurements of functional capacity and personal statement of participant.   Increase Strength and Stamina Yes   Intervention Provide advice, education, support and counseling about physical activity/exercise needs.;Develop an individualized exercise prescription for aerobic and resistive training based on initial evaluation findings, risk stratification, comorbidities and participant's personal goals.   Expected Outcomes Achievement of increased cardiorespiratory fitness and enhanced flexibility, muscular endurance and strength shown through measurements of functional capacity and personal statement of participant.   Hypertension Yes   Intervention Provide education on lifestyle modifcations including regular physical activity/exercise, weight management, moderate sodium restriction and increased consumption of fresh fruit, vegetables, and low fat dairy, alcohol moderation, and smoking cessation.;Monitor prescription use compliance.   Expected Outcomes Short Term: Continued assessment and intervention until BP is < 140/58m HG in hypertensive participants. < 130/823mHG in hypertensive participants with diabetes, heart failure or chronic kidney disease.;Long Term: Maintenance of blood pressure at goal levels.   Lipids Yes   Intervention Provide education and support for participant on nutrition & aerobic/resistive exercise along with prescribed medications to achieve LDL <7054mHDL >52m34m Expected Outcomes Short Term: Participant states understanding of desired cholesterol values and is  compliant with medications prescribed. Participant is following exercise prescription and nutrition guidelines.;Long Term: Cholesterol controlled with medications as prescribed, with individualized exercise RX and with personalized nutrition plan. Value goals: LDL < 70mg57mL > 40 mg.      Core Components/Risk Factors/Patient Goals Review:      Goals and Risk Factor Review    Row Name 07/24/16 1222 08/12/16 1736 09/10/16 1228         Core Components/Risk Factors/Patient Goals Review   Personal Goals Review  - Sedentary;Increase Strength and Stamina;Hypertension;Lipids;Stress;Weight Management/Obesity Lipids;Increase Strength and Stamina     Review KarenRobbyecomplained her heart is really racing after she leaves Cardiac Rehab. Heart rate 106 on treadmill in Cardiac Rehab only exercising at 1.8mph 89m 0.5% incline.I had Willadean Janayla 200 cc of water. I mentioned deconditioning and Patrica Jacksonme she "was pulling brush before her heart attack." Yen Londashe feels part of it is due to her urinary tract infection that she has now. Khristian Shirlenery concerned that her heart "is racing for hours after she leaves Cardiac Rehab. Heart rate at the end of Cardiac Rehab was 86 after cool down and relaxation. Bernestine's weight has started to creep up as she is taking her rides to rehab out to dinner.  She is still having issues with her UTI, Chron's, and bad drug reactions.  She is still coping with a lot of stress and recently was told that they would not be holding her job after 10/26.  Will let counselor know so that she can follow up as well.  She has noted that exercise is starting to help her feel better as she is  able to do more at home than previously.  She is still concerned that she cannot do as much as before, but reasssured that she is still in the recovery phase and fighting other infections.  She has not had her labs tested recently.  Her blood pressure was good today, but she continues to have episodes of  low pressure and is wearing an event monitor. Naileah reports being able to vaccum, mop and wash window with less fatigue than before.  She is pleased to be able to do these chores.  She is not taking cholesterol medication now on Dr orders due to other health issues.     Expected Outcomes Quality exercise/conditioned. Jamesetta will continue to come to exercise and education classes to work on risk factor modification and for support.  We will continue to monitor. Sabina will continue to build strength and endurance and be able to complete ADLs with more ease.  She will follow up with her Dr about cholesterol.          Core Components/Risk Factors/Patient Goals at Discharge (Final Review):      Goals and Risk Factor Review - 09/10/16 1228      Core Components/Risk Factors/Patient Goals Review   Personal Goals Review Lipids;Increase Strength and Stamina   Review Ryliegh reports being able to vaccum, mop and wash window with less fatigue than before.  She is pleased to be able to do these chores.  She is not taking cholesterol medication now on Dr orders due to other health issues.   Expected Outcomes Annalise will continue to build strength and endurance and be able to complete ADLs with more ease.  She will follow up with her Dr about cholesterol.        ITP Comments:     ITP Comments    Row Name 07/06/16 1438 07/15/16 1746 07/16/16 1035 07/22/16 0654 07/23/16 1648   ITP Comments Initial ITP created during [facility] Review. Diagnosis documentation can be found [facility] encounter 06/16/2016 First full day of exercise!  Emer was oriented to gym and equipment including functions, settings, policies, and procedures.  Neyla's individual exercise prescription and treatment plan were reviewed.  All starting workloads were established based on the results of the 6 minute walk test done at initial orientation visit.  The plan for exercise progression was also introduced and progression will be customized  based on patient's performance and goals. Korrina sees her PCP today so cannot attend. She called this morning and was concerned that her back was sore aroun T8-9 where she had fractures years ago.  I advised she use less weight or no weight next time and continue to monitor her pain.  She says it was already hurting last week before she came to class. 30 day review. Continue with ITP unless changes noted by Medical Director at signature of review.  Has started program this week Arlis instructed to contact her doctor's office since she c/o of her heart rate going fast in the evening when she leaves here. Zaila reported she is still on 111m Verapamil. KIveyreported that tha MD increased her verapamil to 1628mbut decreased it when she c/o being tired. KaLyvonnen treadmill at 1.46m49mheart rate 100 even after drinking  240 cc water.    RowFair Havenme 07/24/16 1222 07/24/16 1315 07/31/16 1219 07/31/16 1220 08/03/16 1628   ITP Comments I sent a note via [facility] to Dr. CalClayborn Bignessday-[place] has complained her heart is really racing after she leaves  Cardiac Rehab. Heart rate 106 on treadmill in Cardiac Rehab only exercising at 1.63mh and 0.5% incline.I had KBlakeleydrink 200 cc of water. I mentioned deconditioning and KAunestitold me she "was pulling brush before her heart attack." KJeffriesaid she feels part of it is due to her urinary tract infection that she has now. KRuthieis very concerned that her heart "is racing for hours after she leaves Cardiac Rehab. Heart rate at the end of Cardiac Rehab was 86 after cool down and relaxation. I called KRuhama Lehewto check on her. KIkeshasaid last night was a little better with her heart rate after Cardiac Rehab. I suggested to KTayelorto drink a lot of water before she gets on the treadmill. KSitasaid she finally felt able to drive for the first time in a long time today since before she felt too weak to drive due to her UTI etc. KSigridsaid her 277years old daughter died several years ago.  Her 256year old daughter was hit by a truck on her way home from teaching school. KIsolareported that her 427year old niece died of a massive heart attack a couple of weeks ago and left a 62year old and twins 62years old..Santiago Gladsaid they have a bad family history of heart disease-ie her father died of a massive heart attack and KCelisereported that after the ambulance took her father to the hospital she said she her brother that she needed to take him to the hospital and she never got him there -he died of a heart attack also. KLorelsaid she divorced her husband. 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 KNakinaleft a vm on Cardiac Rehab phone and said that she did drive her on [address]for Cardiac REhab but she felt so weak that she didn't think she should do Cardiac Rehab. KBaharis still struggling with feeling weak which keeps her for exercising.  KSilvanacalled to tell uKoreashe [facility]  She also stated that she is being evaluated for a leaking valve.    Row Name 08/17/16 1825 08/19/16 0768111/08/17 0620 10/07/16 1354 10/14/16 0615   ITP Comments KConsueladrove to class for the first time today. She is feeling better, now that the UTI she has had over a month is better.  30 day review. Continue with ITP unless changes noted by Medical Director at signature of review. HAs been out medical concerns. Returning feeling better. 30 day review completed for Medical Director physician review and signature. Continue ITP unless changes made by physician. KTelissahas not attended since 09/16/16. 30 day review completed for review by Dr MEmily Filbert  Continue with ITP unless changes noted by Dr MSabra Heck   RNorth BeachName 10/14/16 [phone]/02/18 0633         ITP Comments KMarcellinehas been out with medial concerns and hopes to return once cleared by MD 30 day review. Continue with ITP unless directed changes per Medical Director review. Remains out          Comments:

## 2016-11-18 ENCOUNTER — Ambulatory Visit (INDEPENDENT_AMBULATORY_CARE_PROVIDER_SITE_OTHER): Payer: BC Managed Care – PPO | Admitting: Internal Medicine

## 2016-11-18 ENCOUNTER — Encounter: Payer: Self-pay | Admitting: Internal Medicine

## 2016-11-18 VITALS — BP 120/80 | HR 64 | Wt 156.0 lb

## 2016-11-18 DIAGNOSIS — F439 Reaction to severe stress, unspecified: Secondary | ICD-10-CM

## 2016-11-18 DIAGNOSIS — I2510 Atherosclerotic heart disease of native coronary artery without angina pectoris: Secondary | ICD-10-CM | POA: Diagnosis not present

## 2016-11-18 DIAGNOSIS — R42 Dizziness and giddiness: Secondary | ICD-10-CM | POA: Diagnosis not present

## 2016-11-18 DIAGNOSIS — I10 Essential (primary) hypertension: Secondary | ICD-10-CM | POA: Diagnosis not present

## 2016-11-18 DIAGNOSIS — Z0289 Encounter for other administrative examinations: Secondary | ICD-10-CM | POA: Diagnosis not present

## 2016-11-18 DIAGNOSIS — K219 Gastro-esophageal reflux disease without esophagitis: Secondary | ICD-10-CM

## 2016-11-18 DIAGNOSIS — E7800 Pure hypercholesterolemia, unspecified: Secondary | ICD-10-CM | POA: Diagnosis not present

## 2016-11-18 MED ORDER — ROSUVASTATIN CALCIUM 5 MG PO TABS: 5.0000 mg | ORAL_TABLET | Freq: Every day | ORAL | 1 refills | Status: DC

## 2016-11-18 NOTE — Progress Notes (Signed)
Patient ID: Laure Leone, female   DOB: 04-13-1956, 61 y.o.   MRN: [id number]   Subjective:    Patient ID: Lashaunta Sicard, female    DOB: 01/01/1956, 61 y.o.   MRN: [id number]  HPI  Patient here for a scheduled follow up.  She is doing better.  Feels better.  Seeing nephrology and undergoing w/up for for secondary hypertension.  W/up unrevealing except for slightly elevated metanephrine level with normal normetanephrine level.  Unclear as to significance of this.  Planning repeat labs.  She is planning to start tutoring.  This will be less hours and she can pick her hours.  Less stress.  No chest pain.  Energy is improving.  She is more active.  Eating.  No nausea or vomiting.  Bowels stable.  Light headedness has improved.     Past Medical History:  Diagnosis Date  . Allergy   . Chicken pox   . Coronary artery disease   . Heart murmur   . Hyperlipidemia   . Hypertension   . Migraines    hormonal, puberty  . Nephrolithiasis   . Phlebitis    Past Surgical History:  Procedure Laterality Date  . BREAST BIOPSY    . heart murmur    . TONSILLECTOMY AND ADENOIDECTOMY  1962  . tubaligation  1990   Family History  Problem Relation Age of Onset  . Heart disease Father   . Heart disease Brother   . Cancer Maternal Aunt    Social History   Social History  . Marital status: Divorced    Spouse name: N/A  . Number of children: N/A  . Years of education: N/A   Social History Main Topics  . Smoking status: Never Smoker  . Smokeless tobacco: Never Used  . Alcohol use No  . Drug use: Unknown  . Sexual activity: Not Asked   Other Topics Concern  . None   Social History Narrative  . None    Outpatient Encounter Prescriptions as of 11/18/2016  Medication Sig  . aspirin EC 81 MG tablet Take 0.81 mg by mouth daily.  . carvedilol (COREG) 6.25 MG tablet Take 6.25 mg by mouth 2 (two) times daily. 3.1 IN THE AM AND 3.1 AT NIGHT  . clopidogrel (PLAVIX) 75 MG tablet Take 75  mg by mouth daily.  Marland Kitchen EPINEPHrine 0.3 mg/0.3 mL IJ SOAJ injection 0.3 mg as needed.  Marland Kitchen escitalopram (LEXAPRO) 20 MG tablet 10 mg.  . fexofenadine (ALLEGRA) 180 MG tablet Take 180 mg by mouth daily.  . fosfomycin (MONUROL) 3 g PACK Take 3 g by mouth once.  . Mesalamine (DELZICOL) 400 MG CPDR DR capsule Take 6 capsules (2,400 mg total) by mouth daily.  . ondansetron (ZOFRAN ODT) 4 MG disintegrating tablet Take 1 tablet (4 mg total) by mouth every 8 (eight) hours as needed for nausea or vomiting.  . rizatriptan (MAXALT) 5 MG tablet USE AS DIRECTED  . rosuvastatin (CRESTOR) 5 MG tablet Take 1 tablet (5 mg total) by mouth daily.  Marland Kitchen tobramycin-dexamethasone (TOBRADEX) ophthalmic solution Place 1 drop into the right eye every 6 (six) hours.  . verapamil (CALAN) 40 MG tablet Take by mouth.  . [DISCONTINUED] rosuvastatin (CRESTOR) 5 MG tablet Take 1 tablet (5 mg total) by mouth daily.  Marland Kitchen omeprazole (PRILOSEC) 40 MG capsule Take 40 mg by mouth daily. Reported on 04/13/2016   No facility-administered encounter medications on file as of 11/18/2016.     Review of Systems  Constitutional: Negative  for appetite change and unexpected weight change.       Energy better.   HENT: Negative for congestion and sinus pressure.   Respiratory: Negative for cough, chest tightness and shortness of breath.   Cardiovascular: Negative for chest pain, palpitations and leg swelling.  Gastrointestinal: Negative for abdominal pain, diarrhea, nausea and vomiting.  Genitourinary: Negative for difficulty urinating and dysuria.  Musculoskeletal: Negative for back pain and joint swelling.  Skin: Negative for color change and rash.  Neurological: Negative for headaches.       Light headedness has improved.    Psychiatric/Behavioral: Negative for agitation and dysphoric mood.       Objective:    Physical Exam  Constitutional: She appears well-developed and well-nourished. No distress.  HENT:  Nose: Nose normal.    Mouth/Throat: Oropharynx is clear and moist.  Neck: Neck supple. No thyromegaly present.  Cardiovascular: Normal rate and regular rhythm.   Pulmonary/Chest: Breath sounds normal. No respiratory distress. She has no wheezes.  Abdominal: Soft. Bowel sounds are normal. There is no tenderness.  Musculoskeletal: She exhibits no edema or tenderness.  Lymphadenopathy:    She has no cervical adenopathy.  Skin: No rash noted. No erythema.  Psychiatric: She has a normal mood and affect. Her behavior is normal.    BP 120/80   Pulse 64   Wt 156 lb (70.8 kg)   SpO2 97%   BMI 27.63 kg/m  Wt Readings from Last 3 Encounters:  11/18/16 156 lb (70.8 kg)  10/05/16 151 lb 9.6 oz (68.8 kg)  09/22/16 152 lb (68.9 kg)     Lab Results  Component Value Date   WBC 11.5 (H) 09/22/2016   HGB 14.2 09/22/2016   HCT 40.9 09/22/2016   PLT 333 09/22/2016   GLUCOSE 108 (H) 09/22/2016   CHOL 258 (H) 04/09/2016   TRIG 131.0 04/09/2016   HDL 54.20 04/09/2016   LDLCALC 178 (H) 04/09/2016   ALT 21 07/02/2016   AST 23 07/02/2016   NA 138 09/22/2016   K 3.6 09/22/2016   CL 103 09/22/2016   CREATININE 0.99 09/22/2016   BUN 14 09/22/2016   CO2 25 09/22/2016   TSH 2.18 07/02/2016   INR 1.1 09/21/2014    Ct Angio Head W Or Wo Contrast  Result Date: 09/22/2016 CLINICAL DATA:  61 y/o F; high blood pressure, weakness, and feels/hears pulse in head. EXAM: CT ANGIOGRAPHY HEAD AND NECK TECHNIQUE: Multidetector CT imaging of the head and neck was performed using the standard protocol during bolus administration of intravenous contrast. Multiplanar CT image reconstructions and MIPs were obtained to evaluate the vascular anatomy. Carotid stenosis measurements (when applicable) are obtained utilizing NASCET criteria, using the distal internal carotid diameter as the denominator. CONTRAST:  75 cc Isovue 370 COMPARISON:  12/17/2013 CT head FINDINGS: CT HEAD FINDINGS Brain: No evidence of acute infarction, hemorrhage,  hydrocephalus, extra-axial collection or mass lesion/mass effect. Mild chronic microvascular ischemic changes without significant interval change. Vascular: See below. Skull: Normal. Negative for fracture or focal lesion. Sinuses: Persistent right mastoid opacification. Otherwise visualized paranasal sinuses and mastoid air cells are normally aerated. Bilateral intra-ocular lens replacement. Orbits: No acute finding. Review of the MIP images confirms the above findings CTA NECK FINDINGS Aortic arch: Standard branching. Imaged portion shows no evidence of aneurysm or dissection. No significant stenosis of the major arch vessel origins. Right carotid system: No evidence of dissection, stenosis (50% or greater) or occlusion. Mild calcific atherosclerosis of the carotid bifurcation without significant stenosis. Left  carotid system: No evidence of dissection, stenosis (50% or greater) or occlusion. Mixed plaque of left carotid bifurcation and proximal ICA with mild 30% stenosis. Vertebral arteries: Right dominant. No evidence of dissection, stenosis (50% or greater) or occlusion. Skeleton: Bones are unremarkable. Other neck: No mass or inflammatory process identified. Upper chest: 2 mm right upper lobe calcified granuloma (series 8, image 16). Review of the MIP images confirms the above findings CTA HEAD FINDINGS Anterior circulation: No proximal vessel occlusion, aneurysm, or vascular malformation. Calcific atherosclerosis of cavernous and paraclinoid segments of internal carotid arteries with mild-to-moderate stenosis of the paraclinoid segments. Carotid canal dehiscence bilaterally in the middle ear cavities. Posterior circulation: No significant stenosis, proximal occlusion, aneurysm, or vascular malformation. Venous sinuses: As permitted by contrast timing, patent. Anatomic variants: Anterior communicating artery and right posterior communicating arteries are patent. No left posterior communicating artery  identified, likely hypoplastic or absent. Delayed phase: No abnormal intracranial enhancement. Review of the MIP images confirms the above findings IMPRESSION: 1. No evidence of dissection, hemodynamically significant stenosis, or aneurysm of the carotid and vertebral arteries of the neck. 2. Mild right carotid bifurcation calcific atherosclerosis without significant stenosis. 3. Mixed left carotid bifurcation and proximal ICA plaque with 30% stenosis. 4. No proximal vessel occlusion, aneurysm, or vascular malformation of the circle of Willis. 5. Calcific atherosclerosis of carotid siphons with mild-to-moderate stenosis of paraclinoid segments bilaterally. 6. 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 Electronically Signed   By: Kristine Garbe M.D.   On: 09/22/2016 18:43   Dg Chest 2 View  Result Date: 09/22/2016 CLINICAL DATA:  Patient reports "heaviness" to the center of her chest intermittently today and weakness that has been increasing over the past couple of weeks. Patient reports she is being treated for a UTI and patient is complaining of "heari.*comment was truncated* EXAM: CHEST  2 VIEW COMPARISON:  [id number] FINDINGS: Normal mediastinum and cardiac silhouette. Normal pulmonary vasculature. No evidence of effusion, infiltrate, or pneumothorax. No acute bony abnormality. IMPRESSION: No acute cardiopulmonary process. Electronically Signed   By: Suzy Bouchard M.D.   On: 09/22/2016 17:09   Ct Angio Neck W And/or Wo Contrast  Result Date: 09/22/2016 CLINICAL DATA:  61 y/o F; high blood pressure, weakness, and feels/hears pulse in head. EXAM: CT ANGIOGRAPHY HEAD AND NECK TECHNIQUE: Multidetector CT imaging of the head and neck was performed using the standard protocol during bolus administration of intravenous contrast. Multiplanar CT image reconstructions and MIPs were obtained to evaluate the vascular anatomy. Carotid stenosis measurements (when applicable) are obtained  utilizing NASCET criteria, using the distal internal carotid diameter as the denominator. CONTRAST:  75 cc Isovue 370 COMPARISON:  12/17/2013 CT head FINDINGS: CT HEAD FINDINGS Brain: No evidence of acute infarction, hemorrhage, hydrocephalus, extra-axial collection or mass lesion/mass effect. Mild chronic microvascular ischemic changes without significant interval change. Vascular: See below. Skull: Normal. Negative for fracture or focal lesion. Sinuses: Persistent right mastoid opacification. Otherwise visualized paranasal sinuses and mastoid air cells are normally aerated. Bilateral intra-ocular lens replacement. Orbits: No acute finding. Review of the MIP images confirms the above findings CTA NECK FINDINGS Aortic arch: Standard branching. Imaged portion shows no evidence of aneurysm or dissection. No significant stenosis of the major arch vessel origins. Right carotid system: No evidence of dissection, stenosis (50% or greater) or occlusion. Mild calcific atherosclerosis of the carotid bifurcation without significant stenosis. Left carotid system: No evidence of dissection, stenosis (50% or greater) or occlusion. Mixed plaque of left carotid bifurcation  and proximal ICA with mild 30% stenosis. Vertebral arteries: Right dominant. No evidence of dissection, stenosis (50% or greater) or occlusion. Skeleton: Bones are unremarkable. Other neck: No mass or inflammatory process identified. Upper chest: 2 mm right upper lobe calcified granuloma (series 8, image 16). Review of the MIP images confirms the above findings CTA HEAD FINDINGS Anterior circulation: No proximal vessel occlusion, aneurysm, or vascular malformation. Calcific atherosclerosis of cavernous and paraclinoid segments of internal carotid arteries with mild-to-moderate stenosis of the paraclinoid segments. Carotid canal dehiscence bilaterally in the middle ear cavities. Posterior circulation: No significant stenosis, proximal occlusion, aneurysm, or  vascular malformation. Venous sinuses: As permitted by contrast timing, patent. Anatomic variants: Anterior communicating artery and right posterior communicating arteries are patent. No left posterior communicating artery identified, likely hypoplastic or absent. Delayed phase: No abnormal intracranial enhancement. Review of the MIP images confirms the above findings IMPRESSION: 1. No evidence of dissection, hemodynamically significant stenosis, or aneurysm of the carotid and vertebral arteries of the neck. 2. Mild right carotid bifurcation calcific atherosclerosis without significant stenosis. 3. Mixed left carotid bifurcation and proximal ICA plaque with 30% stenosis. 4. No proximal vessel occlusion, aneurysm, or vascular malformation of the circle of Willis. 5. Calcific atherosclerosis of carotid siphons with mild-to-moderate stenosis of paraclinoid segments bilaterally. 6. 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 Electronically Signed   By: Kristine Garbe M.D.   On: 09/22/2016 18:43       Assessment & Plan:   Problem List Items Addressed This Visit    CAD (coronary artery disease)    Followed by cardiology.  Doing better.  Hopefully will be able to return to rehab.  Follow.        Relevant Medications   rosuvastatin (CRESTOR) 5 MG tablet   Essential hypertension    Blood pressure doing better.  Feels better.  Seeing nephrology.  Undergoing w/up as outlined.  Follow.        Relevant Medications   rosuvastatin (CRESTOR) 5 MG tablet   Other Relevant Orders   CBC with Differential/Platelet   Basic metabolic panel   GERD (gastroesophageal reflux disease)    Controlled on prilosec.        Hypercholesterolemia    Will restart crestor.  Start at lower dose.  See if tolerates.  Low cholesterol diet and exercise.  Follow lipid panel and liver function tests.        Relevant Medications   rosuvastatin (CRESTOR) 5 MG tablet   Other Relevant Orders   Hepatic  function panel   Lipid panel   Light headedness    Improved.  Follow.       Stress    Dealing better with increased stress and anxiety.  Off citalopram.  Follow.         Other Visit Diagnoses    Encounter for completion of form with patient    -  Primary   Form completed to start tutoring.         Einar Pheasant, MD

## 2016-11-20 ENCOUNTER — Telehealth: Payer: Self-pay

## 2016-11-20 NOTE — Telephone Encounter (Signed)
Surah has met with her PCP Dr Nicki Reaper and is cleared to return to exercise.  Her BP has been down and she is cleared to drive.  She would like to finish out her last 5 sessions of [facility] and plans to start Monday.

## 2016-11-23 ENCOUNTER — Encounter: Payer: Self-pay | Admitting: Internal Medicine

## 2016-11-23 ENCOUNTER — Encounter: Payer: Self-pay | Admitting: *Deleted

## 2016-11-23 DIAGNOSIS — I214 Non-ST elevation (NSTEMI) myocardial infarction: Secondary | ICD-10-CM

## 2016-11-23 NOTE — Assessment & Plan Note (Signed)
Will restart crestor.  Start at lower dose.  See if tolerates.  Low cholesterol diet and exercise.  Follow lipid panel and liver function tests.

## 2016-11-23 NOTE — Assessment & Plan Note (Signed)
Controlled on prilosec.

## 2016-11-23 NOTE — Assessment & Plan Note (Signed)
Dealing better with increased stress and anxiety.  Off citalopram.  Follow.

## 2016-11-23 NOTE — Progress Notes (Signed)
Discharge Summary  Patient Details  Name: Morgan Moore MRN: [id number] Date of Birth: 16-Nov-1955 Referring Provider:   Flowsheet Row Cardiac Rehab from 07/06/2016 in [facility]  Referring Provider  Lujean Amel MD       Number of Visits:   Reason for Discharge:  Early Exit:  Insurance    Smoking History:  History  Smoking Status  . Never Smoker  Smokeless Tobacco  . Never Used    Diagnosis:  NSTEMI (non-ST elevated myocardial infarction) ([place])  ADL [facility]:   Initial Exercise Prescription:     Initial Exercise Prescription - 07/06/16 1400      Date of Initial Exercise RX and Referring Provider   Date 07/06/16   Referring Provider Lujean Amel MD     Treadmill   MPH 1.8   Grade 0.5   Minutes 15   METs 2.5     NuStep   Level 1   Minutes 15   METs 2     Biostep-RELP   Level 1   Minutes 15   METs 2     Prescription Details   Frequency (times per week) 3   Duration Progress to 45 minutes of aerobic exercise without signs/symptoms of physical distress     Intensity   THRR 40-80% of Max Heartrate 110-143   Ratings of Perceived Exertion 11-15   Perceived Dyspnea 0-4     Progression   Progression Continue to progress workloads to maintain intensity without signs/symptoms of physical distress.     Resistance Training   Training Prescription Yes   Weight 2 lbs   Reps 10-15      Discharge Exercise Prescription (Final Exercise Prescription Changes):     Exercise Prescription Changes - 09/24/16 1100      Exercise Review   Progression Yes     Response to Exercise   Blood Pressure (Admit) 152/78   Blood Pressure (Exercise) 142/68   Blood Pressure (Exit) 148/82   Heart Rate (Admit) 59 bpm   Heart Rate (Exercise) 91 bpm   Heart Rate (Exit) 70 bpm   Rating of Perceived Exertion (Exercise) 12   Duration Progress to 45 minutes of aerobic exercise without signs/symptoms of physical distress   Intensity THRR  unchanged     Progression   Progression Continue to progress workloads to maintain intensity without signs/symptoms of physical distress.   Average METs 2.4     Resistance Training   Training Prescription No     Interval Training   Interval Training No     Treadmill   MPH 2   Grade 0.5   Minutes 15   METs 2.81     NuStep   Level 3   Minutes 15   METs 2      Functional Capacity:     6 Minute Walk    Row Name 07/06/16 1404         6 Minute Walk   Phase Initial     Distance 1100 feet     Walk Time 6 minutes     # of Rest Breaks 0     MPH 2.08     METS 3.14     RPE 11     VO2 Peak 11     Symptoms No     Resting HR 78 bpm     Resting BP 128/64     Max Ex. HR 96 bpm     Max Ex. BP 158/70  2 Minute Post BP 126/64        Psychological, QOL, Others - Outcomes: PHQ 2/9: Depression screen [facility] 2/9 10/05/2016 07/06/2016  Decreased Interest 0 3  Down, Depressed, Hopeless 0 0  PHQ - 2 Score 0 3  Altered sleeping - 0  Tired, decreased energy - 3  Change in appetite - 0  Feeling bad or failure about yourself  - 0  Trouble concentrating - 0  Moving slowly or fidgety/restless - 0  Suicidal thoughts - 0  PHQ-9 Score - 6  Difficult doing work/chores - Very difficult    Quality of Life:     Quality of Life - 07/06/16 1438      Quality of Life Scores   Health/Function Pre 20.92 %   Socioeconomic Pre 27.92 %   Psych/Spiritual Pre 23.33 %   Family Pre 29.38 %   GLOBAL Pre 24.03 %      Personal Goals: Goals established at orientation with interventions provided to work toward goal.     Personal Goals and Risk Factors at Admission - 07/06/16 1449      Core Components/Risk Factors/Patient Goals on Admission   Sedentary Yes   Intervention Provide advice, education, support and counseling about physical activity/exercise needs.;Develop an individualized exercise prescription for aerobic and resistive training based on initial evaluation findings, risk  stratification, comorbidities and participant's personal goals.   Expected Outcomes Achievement of increased cardiorespiratory fitness and enhanced flexibility, muscular endurance and strength shown through measurements of functional capacity and personal statement of participant.   Increase Strength and Stamina Yes   Intervention Provide advice, education, support and counseling about physical activity/exercise needs.;Develop an individualized exercise prescription for aerobic and resistive training based on initial evaluation findings, risk stratification, comorbidities and participant's personal goals.   Expected Outcomes Achievement of increased cardiorespiratory fitness and enhanced flexibility, muscular endurance and strength shown through measurements of functional capacity and personal statement of participant.   Hypertension Yes   Intervention Provide education on lifestyle modifcations including regular physical activity/exercise, weight management, moderate sodium restriction and increased consumption of fresh fruit, vegetables, and low fat dairy, alcohol moderation, and smoking cessation.;Monitor prescription use compliance.   Expected Outcomes Short Term: Continued assessment and intervention until BP is < 140/39m HG in hypertensive participants. < 130/880mHG in hypertensive participants with diabetes, heart failure or chronic kidney disease.;Long Term: Maintenance of blood pressure at goal levels.   Lipids Yes   Intervention Provide education and support for participant on nutrition & aerobic/resistive exercise along with prescribed medications to achieve LDL <7025mHDL >71m56m Expected Outcomes Short Term: Participant states understanding of desired cholesterol values and is compliant with medications prescribed. Participant is following exercise prescription and nutrition guidelines.;Long Term: Cholesterol controlled with medications as prescribed, with individualized exercise RX and  with personalized nutrition plan. Value goals: LDL < 70mg26mL > 40 mg.       Personal Goals Discharge:     Goals and Risk Factor Review    Row Name 07/24/16 1222 08/12/16 1736 09/10/16 1228 11/23/16 1632       Core Components/Risk Factors/Patient Goals Review   Personal Goals Review  - Sedentary;Increase Strength and Stamina;Hypertension;Lipids;Stress;Weight Management/Obesity Lipids;Increase Strength and Stamina Increase Strength and Stamina;Hypertension;Lipids    Review KarenDennishacomplained her heart is really racing after she leaves Cardiac Rehab. Heart rate 106 on treadmill in Cardiac Rehab only exercising at 1.8mph 19m 0.5% incline.I had Shanah Saskia 200 cc of water. I mentioned deconditioning and Kahlie Xanthe  me she "was pulling brush before her heart attack." Solae said she feels part of it is due to her urinary tract infection that she has now. Myalee is very concerned that her heart "is racing for hours after she leaves Cardiac Rehab. Heart rate at the end of Cardiac Rehab was 86 after cool down and relaxation. Malone's weight has started to creep up as she is taking her rides to rehab out to dinner.  She is still having issues with her UTI, Chron's, and bad drug reactions.  She is still coping with a lot of stress and recently was told that they would not be holding her job after 10/26.  Will let counselor know so that she can follow up as well.  She has noted that exercise is starting to help her feel better as she is able to do more at home than previously.  She is still concerned that she cannot do as much as before, but reasssured that she is still in the recovery phase and fighting other infections.  She has not had her labs tested recently.  Her blood pressure was good today, but she continues to have episodes of low pressure and is wearing an event monitor. Cordell reports being able to vaccum, mop and wash window with less fatigue than before.  She is pleased to be able to do these chores.   She is not taking cholesterol medication now on Dr orders due to other health issues. Sirena is still able to do her chores which she is glad for. Her blood pressure has been stable. Her MD will follow up with blood work for her lipid panel.    Expected Outcomes Quality exercise/conditioned. Grace will continue to come to exercise and education classes to work on risk factor modification and for support.  We will continue to monitor. Spirit will continue to build strength and endurance and be able to complete ADLs with more ease.  She will follow up with her Dr about cholesterol.   Cont to build up her endurance and stamina.       Nutrition & Weight - Outcomes:     Pre Biometrics - 07/06/16 1418      Pre Biometrics   Height 5' 3"  (1.6 m)   Weight 152 lb 8 oz (69.2 kg)   Waist Circumference 33 inches   Hip Circumference 40.5 inches   Waist to Hip Ratio 0.81 %   BMI (Calculated) 27.1   Single Leg Stand 0 seconds       Nutrition:     Nutrition Therapy & Goals - 07/06/16 1442      Intervention Plan   Intervention Prescribe, educate and counsel regarding individualized specific dietary modifications aiming towards targeted core components such as weight, hypertension, lipid management, diabetes, heart failure and other comorbidities.   Expected Outcomes Short Term Goal: Understand basic principles of dietary content, such as calories, fat, sodium, cholesterol and nutrients.;Short Term Goal: A plan has been developed with personal nutrition goals set during dietitian appointment.;Long Term Goal: Adherence to prescribed nutrition plan.      Nutrition Discharge:     Nutrition Assessments - 07/06/16 1443      Rate Your Plate Scores   Pre Score 57   Pre Score % 63 %      Education Questionnaire Score:     Knowledge Questionnaire Score - 07/06/16 1452      Knowledge Questionnaire Score   Pre Score 23/28      Goals reviewed with  patient; copy given to patient.

## 2016-11-23 NOTE — Assessment & Plan Note (Signed)
Blood pressure doing better.  Feels better.  Seeing nephrology.  Undergoing w/up as outlined.  Follow.

## 2016-11-23 NOTE — Progress Notes (Signed)
Cardiac Individual Treatment Plan  Patient Details  Name: Morgan Moore MRN: [id number] Date of Birth: [birth year] Referring Provider:   Flowsheet Row Cardiac Rehab from 07/06/2016 in [facility] Cardiac and Pulmonary Rehab  Referring Provider  Lujean Amel MD      Initial Encounter Date:  Flowsheet Row Cardiac Rehab from 07/06/2016 in [facility] Cardiac and Pulmonary Rehab  Date  07/06/16  Referring Provider  Lujean Amel MD      Visit Diagnosis: NSTEMI (non-ST elevated myocardial infarction) [facility])  Patient's Home Medications on Admission:  Current Outpatient Prescriptions:  .  aspirin EC 81 MG tablet, Take 0.81 mg by mouth daily., Disp: , Rfl:  .  carvedilol (COREG) 6.25 MG tablet, Take 6.25 mg by mouth 2 (two) times daily. 3.1 IN THE AM AND 3.1 AT NIGHT, Disp: , Rfl:  .  clopidogrel (PLAVIX) 75 MG tablet, Take 75 mg by mouth daily., Disp: , Rfl:  .  EPINEPHrine 0.3 mg/0.3 mL IJ SOAJ injection, 0.3 mg as needed., Disp: , Rfl:  .  escitalopram (LEXAPRO) 20 MG tablet, 10 mg., Disp: , Rfl:  .  fexofenadine (ALLEGRA) 180 MG tablet, Take 180 mg by mouth daily., Disp: , Rfl:  .  fosfomycin (MONUROL) 3 g PACK, Take 3 g by mouth once., Disp: , Rfl:  .  Mesalamine (DELZICOL) 400 MG CPDR DR capsule, Take 6 capsules (2,400 mg total) by mouth daily., Disp: 180 capsule, Rfl: 2 .  omeprazole (PRILOSEC) 40 MG capsule, Take 40 mg by mouth daily. Reported on 04/13/2016, Disp: , Rfl:  .  ondansetron (ZOFRAN ODT) 4 MG disintegrating tablet, Take 1 tablet (4 mg total) by mouth every 8 (eight) hours as needed for nausea or vomiting., Disp: 20 tablet, Rfl: 0 .  rizatriptan (MAXALT) 5 MG tablet, USE AS DIRECTED, Disp: 10 tablet, Rfl: 0 .  rosuvastatin (CRESTOR) 5 MG tablet, Take 1 tablet (5 mg total) by mouth daily., Disp: 30 tablet, Rfl: 1 .  tobramycin-dexamethasone (TOBRADEX) ophthalmic solution, Place 1 drop into the right eye every 6 (six) hours., Disp: 5 mL, Rfl: 0 .  verapamil (CALAN) 40 MG tablet,  Take by mouth., Disp: , Rfl:   Past Medical History: Past Medical History:  Diagnosis Date  . Allergy   . Chicken pox   . Coronary artery disease   . Heart murmur   . Hyperlipidemia   . Hypertension   . Migraines    hormonal, puberty  . Nephrolithiasis   . Phlebitis     Tobacco Use: History  Smoking Status  . Never Smoker  Smokeless Tobacco  . Never Used    Labs: Recent Review Flowsheet Data    Labs for ITP Cardiac and Pulmonary Rehab Latest Ref Rng & Units 09/24/2014 02/01/2015 11/07/2015 04/09/2016   Cholestrol 0 - 200 mg/dL 191 249(A) 254(H) 258(H)   LDLCALC 0 - 99 mg/dL 107(H) 168 169(H) 178(H)   HDL >39.00 mg/dL 50 49 56.50 54.20   Trlycerides 0.0 - 149.0 mg/dL 172 198(A) 142.0 131.0       Exercise Target Goals:    Exercise Program Goal: Individual exercise prescription set with THRR, safety & activity barriers. Participant demonstrates ability to understand and report RPE using BORG scale, to self-measure pulse accurately, and to acknowledge the importance of the exercise prescription.  Exercise Prescription Goal: Starting with aerobic activity 30 plus minutes a day, 3 days per week for initial exercise prescription. Provide home exercise prescription and guidelines that participant acknowledges understanding prior to discharge.  Activity Barriers &  Risk Stratification:     Activity Barriers & Cardiac Risk Stratification - 07/06/16 1409      Activity Barriers & Cardiac Risk Stratification   Activity Barriers History of Falls;Balance Concerns;Deconditioning;Muscular Weakness;Other (comment)   Comments occasional dizzy spells, r hip bone spurs      6 Minute Walk:     6 Minute Walk    Row Name 07/06/16 1404         6 Minute Walk   Phase Initial     Distance 1100 feet     Walk Time 6 minutes     # of Rest Breaks 0     MPH 2.08     METS 3.14     RPE 11     VO2 Peak 11     Symptoms No     Resting HR 78 bpm     Resting BP 128/64     Max Ex. HR  96 bpm     Max Ex. BP 158/70     2 Minute Post BP 126/64        Initial Exercise Prescription:     Initial Exercise Prescription - 07/06/16 1400      Date of Initial Exercise RX and Referring Provider   Date 07/06/16   Referring Provider Lujean Amel MD     Treadmill   MPH 1.8   Grade 0.5   Minutes 15   METs 2.5     NuStep   Level 1   Minutes 15   METs 2     Biostep-RELP   Level 1   Minutes 15   METs 2     Prescription Details   Frequency (times per week) 3   Duration Progress to 45 minutes of aerobic exercise without signs/symptoms of physical distress     Intensity   THRR 40-80% of Max Heartrate 110-143   Ratings of Perceived Exertion 11-15   Perceived Dyspnea 0-4     Progression   Progression Continue to progress workloads to maintain intensity without signs/symptoms of physical distress.     Resistance Training   Training Prescription Yes   Weight 2 lbs   Reps 10-15      Perform Capillary Blood Glucose checks as needed.  Exercise Prescription Changes:     Exercise Prescription Changes    Row Name 07/06/16 1300 07/16/16 1000 07/29/16 1400 08/12/16 1200 08/27/16 1100     Exercise Review   Progression  -  - Yes Yes Yes     Response to Exercise   Blood Pressure (Admit) 128/84 144/68 142/82 124/70 138/88   Blood Pressure (Exercise) 158/70 162/82 138/60 134/70 192/78   Blood Pressure (Exit) 126/64 138/82 132/72 144/82 148/76   Heart Rate (Admit) 83 bpm 73 bpm 65 bpm 72 bpm 88 bpm   Heart Rate (Exercise) 96 bpm 97 bpm 99 bpm 90 bpm 99 bpm   Heart Rate (Exit) 78 bpm 75 bpm 89 bpm 68 bpm 72 bpm   Rating of Perceived Exertion (Exercise) 11 11 12 12 13    Duration  -  -  - Progress to 45 minutes of aerobic exercise without signs/symptoms of physical distress Progress to 45 minutes of aerobic exercise without signs/symptoms of physical distress   Intensity  -  -  - THRR unchanged THRR unchanged     Progression   Progression  -  -  - Continue to  progress workloads to maintain intensity without signs/symptoms of physical distress. Continue to progress workloads to maintain intensity  without signs/symptoms of physical distress.   Average METs  - 2.63 1.75 1.7 2.9     Resistance Training   Training Prescription  - Yes Yes No No   Weight  - 2 2 -  doesnt do due to back  pain  -   Reps  - 10-15 10-15  -  -     Interval Training   Interval Training  - No No No No     Treadmill   MPH  - 1  -  - 2   Grade  - 0  -  - 0.5   Minutes  - 15  -  - 15   METs  - 1.77  -  - 2.81     NuStep   Level  -  - 3 3 3    Minutes  -  - 15 15 15    METs  -  - 1.5 1.4 3     Biostep-RELP   Level  - 1 1 1   -   Minutes  - 15 15 15   -   METs  -  - 2 2  -   Row Name 09/09/16 0900 09/10/16 1200 09/24/16 1100         Exercise Review   Progression Yes  - Yes       Response to Exercise   Blood Pressure (Admit)  -  - 152/78     Blood Pressure (Exercise) 156/80  - 142/68     Blood Pressure (Exit) 148/80  - 148/82     Heart Rate (Admit) 76 bpm  - 59 bpm     Heart Rate (Exercise) 102 bpm  - 91 bpm     Heart Rate (Exit) 68 bpm  - 70 bpm     Rating of Perceived Exertion (Exercise) 12  - 12     Duration Progress to 45 minutes of aerobic exercise without signs/symptoms of physical distress  - Progress to 45 minutes of aerobic exercise without signs/symptoms of physical distress     Intensity THRR unchanged  - THRR unchanged       Progression   Progression Continue to progress workloads to maintain intensity without signs/symptoms of physical distress.  - Continue to progress workloads to maintain intensity without signs/symptoms of physical distress.     Average METs 2.2  - 2.4       Resistance Training   Training Prescription No  - No       Interval Training   Interval Training No  - No       Treadmill   MPH  -  - 2     Grade  -  - 0.5     Minutes  -  - 15     METs  -  - 2.81       NuStep   Level 3  - 3     Minutes 15  - 15     METs 2.2  -  2       Home Exercise Plan   Plans to continue exercise at  [facility] (comment)  -     Frequency  - Add 2 additional days to program exercise sessions.  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 First full day of exercise!  Morgan Moore was oriented to gym and equipment including functions, settings, policies, and procedures.  Morgan Moore's individual exercise prescription and treatment plan were reviewed.  All starting workloads were established based on the results of the 6 minute walk test done at initial orientation visit.  The plan for exercise progression was also introduced and progression will be customized based on patient's performance and goals. Morgan Moore sees her PCP today so cannot attend. She called this morning and was concerned that her back was sore aroun T8-9 where she had fractures years ago.  I advised she use less weight or no weight next time and continue to monitor her pain.  She says it was already hurting last week before she came to class. Morgan Moore has complained her heart is really racing after she leaves Cardiac Rehab. Heart rate 106 on treadmill in Cardiac Rehab only exercising at 1.50mh and 0.5% incline.I had KLenoxdrink 200 cc of water. I mentioned deconditioning and Morgan Moore me she "was pulling brush before her heart attack." KZakiyyahsaid she feels part of it is due to her urinary tract infection that she has now. Morgan Moore very concerned that her heart "is racing for hours after she leaves Cardiac Rehab. Heart rate at the end of Cardiac Rehab was 86 after cool down and relaxation. Morgan Moore progressing well with exercise.   Row Name 08/12/16 1302 08/27/16 1109 09/09/16 0954 09/10/16 1232 09/24/16 1132   Exercise Comments Morgan Moore progressing well with exercise and  modifying as needed for her back. KTiaunais progressing well with exercise. KZuriyahis progressing well with exercise. Home exercise was reviewed along with THR, RPE and safety precautions.  [facility] KLeeyah[facility] well with exercise when she attends.  She has been on antibiotics for UTI for an extended time that she states make her not feel good in general, and makes progressing  exercise more difficult.   Row Name 10/07/16 1354           Exercise Comments Morgan Moore not attended since 09/16/16.          Discharge Exercise Prescription (Final Exercise Prescription Changes):     Exercise Prescription Changes - 09/24/16 1100      Exercise Review   Progression Yes     Response to Exercise   Blood Pressure (Admit) 152/78   Blood Pressure (Exercise) 142/68   Blood Pressure (Exit) 148/82   Heart Rate (Admit) 59 bpm   Heart Rate (Exercise) 91 bpm   Heart Rate (Exit) 70 bpm   Rating of Perceived Exertion (Exercise) 12   Duration Progress to 45 minutes of aerobic exercise without signs/symptoms of physical distress   Intensity THRR unchanged     Progression   Progression Continue to progress workloads to maintain intensity without signs/symptoms of physical distress.   Average METs 2.4     Resistance Training   Training Prescription No     Interval Training   Interval Training No     Treadmill   MPH 2   Grade 0.5   Minutes 15   METs 2.81     NuStep   Level 3   Minutes 15   METs 2      Nutrition:  Target Goals: Understanding of nutrition guidelines, daily intake of sodium <15030m cholesterol <20078mcalories 30% from fat and 7% or less from saturated fats, daily to have  5 or more servings of fruits and vegetables.  Biometrics:     Pre Biometrics - 07/06/16 1418      Pre Biometrics   Height 5' 3"  (1.6 m)   Weight 152 lb 8 oz (69.2 kg)   Waist Circumference 33 inches   Hip Circumference 40.5 inches   Waist to Hip Ratio  0.81 %   BMI (Calculated) 27.1   Single Leg Stand 0 seconds       Nutrition Therapy Plan and Nutrition Goals:     Nutrition Therapy & Goals - 07/06/16 1442      Intervention Plan   Intervention Prescribe, educate and counsel regarding individualized specific dietary modifications aiming towards targeted core components such as weight, hypertension, lipid management, diabetes, heart failure and other comorbidities.   Expected Outcomes Short Term Goal: Understand basic principles of dietary content, such as calories, fat, sodium, cholesterol and nutrients.;Short Term Goal: A plan has been developed with personal nutrition goals set during dietitian appointment.;Long Term Goal: Adherence to prescribed nutrition plan.      Nutrition Discharge: Rate Your Plate Scores:     Nutrition Assessments - 07/06/16 1443      Rate Your Plate Scores   Pre Score 57   Pre Score % 63 %      Nutrition Goals Re-Evaluation:     Nutrition Goals Re-Evaluation    Row Name 11/23/16 1631             Personal Goal #1 Re-Evaluation   Personal Goal #1 Eating heart healthy       Goal Progress Seen Yes          Psychosocial: Target Goals: Acknowledge presence or absence of depression, maximize coping skills, provide positive support system. Participant is able to verbalize types and ability to use techniques and skills needed for reducing stress and depression.  Initial Review & Psychosocial Screening:     Initial Psych Review & Screening - 07/06/16 1453      Family Dynamics   Concerns --  Attended couseling after daughter's death      Quality of Life Scores:     Quality of Life - 07/06/16 1438      Quality of Life Scores   Health/Function Pre 20.92 %   Socioeconomic Pre 27.92 %   Psych/Spiritual Pre 23.33 %   Family Pre 29.38 %   GLOBAL Pre 24.03 %      PHQ-9: Recent Review Flowsheet Data    Depression screen [facility] 2/9 10/05/2016 07/06/2016   Decreased Interest 0 3   Down,  Depressed, Hopeless 0 0   PHQ - 2 Score 0 3   Altered sleeping - 0   Tired, decreased energy - 3   Change in appetite - 0   Feeling bad or failure about yourself  - 0   Trouble concentrating - 0   Moving slowly or fidgety/restless - 0   Suicidal thoughts - 0   PHQ-9 Score - 6   Difficult doing work/chores - Very difficult      Psychosocial Evaluation and Intervention:     Psychosocial Evaluation - 07/15/16 1733      Psychosocial Evaluation & Interventions   Interventions Encouraged to exercise with the program and follow exercise prescription   Comments Counselor met with Ms. Tamera Punt today for initial psychosocial evaluation.  She is a 61 year old who reports "passing out" at a wedding in July due to a heart attack and resulted in surgery with (2) stents inserted.  She lives alone, but reports having a strong support system with sisters and a brother who live close by and "tons of friends" and church family who are involved in her life.  Ms. C states she just recovered from a UTI and has been feeling extremely weak since that time.  She sleeps "okay" with about (5) hours each night.  She reports to having been very sedentary since the heart attack.  Ms. C states her appetite is improving since the UTI.  She denies a history of depression or anxiety or any current symptoms and states her mood is typically positive.  Ms. C lost a daughter 5 years ago and a niece passed away this past Jun 04, 2023, so she has been grieving those losses.  She has a son who just got married and lives 2 hours away which brings her "great joy."  Ms. Loletha Grayer has goals to be stronger and not dizzy as much.  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   Counselor provided supportive services and will continue to follow with Ms. C throughout the course of this program.        Psychosocial Re-Evaluation:     Psychosocial  Re-Evaluation    Row Name 07/24/16 1316 08/10/16 1734 08/24/16 1724 11/23/16 1633       Psychosocial Re-Evaluation   Interventions Encouraged to attend Cardiac Rehabilitation for the exercise  -  -  -    Comments I called Oswaldo Done to check on her. Kymberly said last night was a little better with her heart rate after Cardiac Rehab. I suggested to Noora to drink a lot of water before she gets on the treadmill. Jaziya said she finally felt able to drive for the first time in a long time today since before she felt too weak to drive due to her UTI etc. Lincoln said her 51 years old daughter died several years ago. Her 55 year old daughter was hit by a truck on her way home from teaching school. Veleka reported that her 90 year old niece died of a massive heart attack a couple of weeks ago and left a 62 year old and twins 62 years old.Santiago Glad said they have a bad family history of heart disease-ie her father died of a massive heart attack and Davin reported that after the ambulance took her father to the hospital she said she her brother that she needed to take him to the hospital and she never got him there -he died of a heart attack also. Timira said she divorced her husband. 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 Follow up with Ms. Chambers today reporting had another drug reaction last Friday and hopefully the new drug for blood pressure is going to help with decreasing her light headedness and have no negative side effects.  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  This is a huge loss for her.  Counselor discussed leaning on her friends and her faith as ways to cope, in addition to consistent exercise.  She maintains that her mood continues to be mostly positive, but counselor is concerned about all of her losses  and will continue to  follow with Ms. C and provide supportive services.   Ms. Jamroz reports she is feeling somewhat better and stronger lately; as she is not as dizzy; is over the UTI finally; and has more energy in general.   She also reports she has been driving a little lately; which is encouraging.  She plans to have a Halloween party and is preparing for this; which is an annual event she does.  Counselor commended Ms. Tischer for hanging in there during all her health and work related disappointments and making the most of life currently.   Shanedra said she has good and bad days emotionally since her daughter got killed by a tractor trailer several years ago.    Continued Psychosocial Services Needed Yes  -  -  -       Vocational Rehabilitation: Provide vocational rehab assistance to qualifying candidates.   Vocational Rehab Evaluation & Intervention:     Vocational Rehab - 07/06/16 1453      Initial Vocational Rehab Evaluation & Intervention   Assessment shows need for Vocational Rehabilitation No      Education: Education Goals: Education classes will be provided on a weekly basis, covering required topics. Participant will state understanding/return demonstration of topics presented.  Learning Barriers/Preferences:     Learning Barriers/Preferences - 07/06/16 1452      Learning Barriers/Preferences   Learning Barriers None   Learning Preferences None      Education Topics: General Nutrition Guidelines/Fats and Fiber: -Group instruction provided by verbal, written material, models and posters to present the general guidelines for heart healthy nutrition. Gives an explanation and review of dietary fats and fiber. Flowsheet Row Cardiac Rehab from 09/14/2016 in [facility] Cardiac and Pulmonary Rehab  Date  07/27/16  Educator  P. Leonel Ramsay, [place]  Instruction Review Code  2- meets goals/outcomes      Controlling Sodium/Reading Food Labels: -Group verbal and written material  supporting the discussion of sodium use in heart healthy nutrition. Review and explanation with models, verbal and written materials for utilization of the food label.   Exercise Physiology & Risk Factors: - Group verbal and written instruction with models to review the exercise physiology of the cardiovascular system and associated critical values. Details cardiovascular disease risk factors and the goals associated with each risk factor. Flowsheet Row Cardiac Rehab from 09/14/2016 in [facility] Cardiac and Pulmonary Rehab  Date  08/10/16  Educator  [facility]  Instruction Review Code  2- meets goals/outcomes      Aerobic Exercise & Resistance Training: - Gives group verbal and written discussion on the health impact of inactivity. On the components of aerobic and resistive training programs and the benefits of this training and how to safely progress through these programs. Flowsheet Row Cardiac Rehab from 09/14/2016 in [facility] Cardiac and Pulmonary Rehab  Date  08/12/16  Educator  AS  Instruction Review Code  2- meets goals/outcomes      Flexibility, Balance, General Exercise Guidelines: - Provides group verbal and written instruction on the benefits of flexibility and balance training programs. Provides general exercise guidelines with specific guidelines to those with heart or lung disease. Demonstration and skill practice provided. Flowsheet Row Cardiac Rehab from 09/14/2016 in [facility] Cardiac and Pulmonary Rehab  Date  08/17/16  Educator  [facility]  Instruction Review Code  2- meets goals/outcomes      Stress Management: - Provides group verbal and written instruction about the health risks of elevated stress, cause of high stress, and healthy ways to reduce stress.  Flowsheet Row Cardiac Rehab from 09/14/2016 in [facility] Cardiac and Pulmonary Rehab  Date  08/26/16  Educator  [facility]  Instruction Review Code  2- meets goals/outcomes      Depression: - Provides group verbal and written instruction on the  correlation between heart/lung disease and depressed mood, treatment options, and the stigmas associated with seeking treatment. Flowsheet Row Cardiac Rehab from 09/14/2016 in [facility] Cardiac and Pulmonary Rehab  Date  07/29/16  Educator  [facility]  Instruction Review Code  2- meets goals/outcomes      Anatomy & Physiology of the Heart: - Group verbal and written instruction and models provide basic cardiac anatomy and physiology, with the coronary electrical and arterial systems. Review of: AMI, Angina, Valve disease, Heart Failure, Cardiac Arrhythmia, Pacemakers, and the ICD. Flowsheet Row Cardiac Rehab from 09/14/2016 in [facility] Cardiac and Pulmonary Rehab  Date  08/24/16  Educator  CE  Instruction Review Code  2- meets goals/outcomes      Cardiac Procedures: - Group verbal and written instruction and models to describe the testing methods done to diagnose heart disease. Reviews the outcomes of the test results. Describes the treatment choices: Medical Management, Angioplasty, or Coronary Bypass Surgery. Flowsheet Row Cardiac Rehab from 09/14/2016 in [facility] Cardiac and Pulmonary Rehab  Date  08/31/16  Educator  CE  Instruction Review Code  2- meets goals/outcomes      Cardiac Medications: - Group verbal and written instruction to review commonly prescribed medications for heart disease. Reviews the medication, class of the drug, and side effects. Includes the steps to properly store meds and maintain the prescription regimen. Flowsheet Row Cardiac Rehab from 09/14/2016 in [facility] Cardiac and Pulmonary Rehab  Date  09/07/16  Educator  SB  Instruction Review Code  2- meets goals/outcomes      Go Sex-Intimacy & Heart Disease, Get SMART - Goal Setting: - Group verbal and written instruction through game format to discuss heart disease and the return to sexual intimacy. Provides group verbal and written material to discuss and apply goal setting through the application of the S.M.A.R.T.  Method. Flowsheet Row Cardiac Rehab from 09/14/2016 in [facility] Cardiac and Pulmonary Rehab  Date  08/31/16  Educator  CE  Instruction Review Code  2- meets goals/outcomes      Other Matters of the Heart: - Provides group verbal, written materials and models to describe Heart Failure, Angina, Valve Disease, and Diabetes in the realm of heart disease. Includes description of the disease process and treatment options available to the cardiac patient. Flowsheet Row Cardiac Rehab from 09/14/2016 in [facility] Cardiac and Pulmonary Rehab  Date  08/24/16  Educator  CE  Instruction Review Code  2- meets goals/outcomes      Exercise & Equipment Safety: - Individual verbal instruction and demonstration of equipment use and safety with use of the equipment. Flowsheet Row Cardiac Rehab from 09/14/2016 in [facility]  Date  07/06/16  Educator   SB  Instruction Review Code  2- meets goals/outcomes      Infection Prevention: - Provides verbal and written material to individual with discussion of infection control including proper hand washing and proper equipment cleaning during exercise session. Flowsheet Row Cardiac Rehab from 09/14/2016 in [facility] Cardiac and Pulmonary Rehab  Date  07/06/16  Educator  SB  Instruction Review Code  2- meets goals/outcomes      Falls Prevention: - Provides verbal and written material to individual with discussion of falls prevention and safety. [facility]  from 09/14/2016 in [facility] Cardiac and Pulmonary Rehab  Date  07/06/16  Educator  SB  Instruction Review Code  2- meets goals/outcomes      Diabetes: - Individual verbal and written instruction to review signs/symptoms of diabetes, desired ranges of glucose level fasting, after meals and with exercise. Advice that pre and post exercise glucose checks will be done for 3 sessions at entry of program.    Knowledge Questionnaire Score:     Knowledge Questionnaire Score - 07/06/16  1452      Knowledge Questionnaire Score   Pre Score 23/28      Core Components/Risk Factors/Patient Goals at Admission:     Personal Goals and Risk Factors at Admission - 07/06/16 1449      Core Components/Risk Factors/Patient Goals on Admission   Sedentary Yes   Intervention Provide advice, education, support and counseling about physical activity/exercise needs.;Develop an individualized exercise prescription for aerobic and resistive training based on initial evaluation findings, risk stratification, comorbidities and participant's personal goals.   Expected Outcomes Achievement of increased cardiorespiratory fitness and enhanced flexibility, muscular endurance and strength shown through measurements of functional capacity and personal statement of participant.   Increase Strength and Stamina Yes   Intervention Provide advice, education, support and counseling about physical activity/exercise needs.;Develop an individualized exercise prescription for aerobic and resistive training based on initial evaluation findings, risk stratification, comorbidities and participant's personal goals.   Expected Outcomes Achievement of increased cardiorespiratory fitness and enhanced flexibility, muscular endurance and strength shown through measurements of functional capacity and personal statement of participant.   Hypertension Yes   Intervention Provide education on lifestyle modifcations including regular physical activity/exercise, weight management, moderate sodium restriction and increased consumption of fresh fruit, vegetables, and low fat dairy, alcohol moderation, and smoking cessation.;Monitor prescription use compliance.   Expected Outcomes Short Term: Continued assessment and intervention until BP is < 140/75m HG in hypertensive participants. < 130/872mHG in hypertensive participants with diabetes, heart failure or chronic kidney disease.;Long Term: Maintenance of blood pressure at goal  levels.   Lipids Yes   Intervention Provide education and support for participant on nutrition & aerobic/resistive exercise along with prescribed medications to achieve LDL <7058mHDL >61m75m Expected Outcomes Short Term: Participant states understanding of desired cholesterol values and is compliant with medications prescribed. Participant is following exercise prescription and nutrition guidelines.;Long Term: Cholesterol controlled with medications as prescribed, with individualized exercise RX and with personalized nutrition plan. Value goals: LDL < 70mg67mL > 40 mg.      Core Components/Risk Factors/Patient Goals Review:      Goals and Risk Factor Review    Row Name 07/24/16 1222 08/12/16 1736 09/10/16 1228 11/23/16 1632       Core Components/Risk Factors/Patient Goals Review   Personal Goals Review  - Sedentary;Increase Strength and Stamina;Hypertension;Lipids;Stress;Weight Management/Obesity Lipids;Increase Strength and Stamina Increase Strength and Stamina;Hypertension;Lipids    Review KarenKyrracomplained her heart is really racing after she leaves Cardiac Rehab. Heart rate 106 on treadmill in Cardiac Rehab only exercising at 1.8mph 4m 0.5% incline.I had Kursten Eisha 200 cc of water. I mentioned deconditioning and Winnell Karonme she "was pulling brush before her heart attack." Larisha Haylynshe feels part of it is due to her urinary tract infection that she has now. Markitta Fabiolary concerned that her heart "is racing for hours after she leaves Cardiac Rehab. Heart rate at the end of Cardiac Rehab was 86 after cool down and relaxation. Oriel's weight  has started to creep up as she is taking her rides to rehab out to dinner.  She is still having issues with her UTI, Chron's, and bad drug reactions.  She is still coping with a lot of stress and recently was told that they would not be holding her job after 10/26.  Will let counselor know so that she can follow up as well.  She has noted that  exercise is starting to help her feel better as she is able to do more at home than previously.  She is still concerned that she cannot do as much as before, but reasssured that she is still in the recovery phase and fighting other infections.  She has not had her labs tested recently.  Her blood pressure was good today, but she continues to have episodes of low pressure and is wearing an event monitor. Varonica reports being able to vaccum, mop and wash window with less fatigue than before.  She is pleased to be able to do these chores.  She is not taking cholesterol medication now on Dr orders due to other health issues. Raksha is still able to do her chores which she is glad for. Her blood pressure has been stable. Her MD will follow up with blood work for her lipid panel.    Expected Outcomes Quality exercise/conditioned. Alanni will continue to come to exercise and education classes to work on risk factor modification and for support.  We will continue to monitor. Jessyka will continue to build strength and endurance and be able to complete ADLs with more ease.  She will follow up with her Dr about cholesterol.   Cont to build up her endurance and stamina.       Core Components/Risk Factors/Patient Goals at Discharge (Final Review):      Goals and Risk Factor Review - 11/23/16 1632      Core Components/Risk Factors/Patient Goals Review   Personal Goals Review Increase Strength and Stamina;Hypertension;Lipids   Review Nayra is still able to do her chores which she is glad for. Her blood pressure has been stable. Her MD will follow up with blood work for her lipid panel.   Expected Outcomes Cont to build up her endurance and stamina.      ITP Comments:     ITP Comments    Row Name 07/06/16 1438 07/15/16 1746 07/16/16 1035 07/22/16 0654 07/23/16 1648   ITP Comments Initial ITP created during [facility] Review. Diagnosis documentation can be found [facility] encounter 06/16/2016 First full day of  exercise!  Dannia was oriented to gym and equipment including functions, settings, policies, and procedures.  Takara's individual exercise prescription and treatment plan were reviewed.  All starting workloads were established based on the results of the 6 minute walk test done at initial orientation visit.  The plan for exercise progression was also introduced and progression will be customized based on patient's performance and goals. Dolorez sees her PCP today so cannot attend. She called this morning and was concerned that her back was sore aroun T8-9 where she had fractures years ago.  I advised she use less weight or no weight next time and continue to monitor her pain.  She says it was already hurting last week before she came to class. 30 day review. Continue with ITP unless changes noted by Medical Director at signature of review.  Has started program this week Letanya instructed to contact her doctor's office since she c/o of her heart rate going  fast in the evening when she leaves here. Demya reported she is still on 197m Verapamil. KKemeshareported that tha MD increased her verapamil to 164mbut decreased it when she c/o being tired. KaNeiln treadmill at 1.56m74mheart rate 100 even after drinking  240 cc water.    RowEl Pasome 07/24/16 1222 07/24/16 1315 07/31/16 1219 07/31/16 1220 08/03/16 1628   ITP Comments I sent a note via [facility] to Dr. CalClayborn Bignessday-Nareh has complained her heart is really racing after she leaves Cardiac Rehab. Heart rate 106 on treadmill in Cardiac Rehab only exercising at 1.8mp24mnd 0.5% incline.I had KareGertienk 200 cc of water. I mentioned deconditioning and KareLajoyad me she "was pulling brush before her heart attack." KareKameshiad she feels part of it is due to her urinary tract infection that she has now. KareFaleciavery concerned that her heart "is racing for hours after she leaves Cardiac Rehab. Heart rate at the end of Cardiac Rehab was 86 after cool down and relaxation. I called  KareAni Deoliveiracheck on her. KareBelissad last night was a little better with her heart rate after Cardiac Rehab. I suggested to KareDianndrink a lot of water before she gets on the treadmill. KareMargurited she finally felt able to drive for the first time in a long time today since before she felt too weak to drive due to her UTI etc. KareYid her 22 y68rs old daughter died several years ago. Her 22 y56r old daughter was hit by a truck on her way home from teaching school. KareInasorted that her 48 y37r old niece died of a massive heart attack a couple of weeks ago and left a 13 y24r old and twins 23 y50rs old.. KaSantiago Gladd they have a bad family history of heart disease-ie her father died of a massive heart attack and KareLoureneorted that after the ambulance took her father to the hospital she said she her brother that she needed to take him to the hospital and she never got him there -he died of a heart attack also. KareDelenad she divorced her husband. 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 KareKensleit a vm on Cardiac Rehab phone and said that she did drive her on WEd 9/201/61 Cardiac REhab but she felt so weak that she didn't think she should do Cardiac Rehab. KareAsalstill struggling with feeling weak which keeps her for exercising.  KareMaisynled to tell us [facility] [facility]  [facility].  She also stated that she is being evaluated for a leaking valve.    Row Name 08/17/16 1825 08/19/16 [id number] 0620 10/07/16 1354 10/14/16 0615   ITP Comments KareDinorahve to class for the first time today. She is feeling better, now that the UTI she has had over a month is better.  30 day review. Continue with ITP unless changes noted by Medical Director at signature of review. HAs been out medical concerns. Returning feeling better. 30 day review completed for Medical Director physician review and signature. Continue ITP unless changes made by  physician. KareRussie not attended since 09/16/16. 30 day review completed for review by Dr MarkEmily Filbertontinue with ITP unless changes noted by Dr MIllSabra HeckRow [place] 10/14/16 [id number] 1631       ITP Comments KareKaelan been out with medial concerns  and hopes to return once cleared by MD 30 day review. Continue with ITP unless directed changes per Medical Director review. Remains out Mirely requested to be discharged from Cardiac Rehab since the first of the year her insurance is charging her for Cardiac Rehab.         Comments: discharged today per [facility] request.

## 2016-11-23 NOTE — Assessment & Plan Note (Signed)
Followed by cardiology.  Doing better.  Hopefully will be able to return to rehab.  Follow.

## 2016-11-23 NOTE — Assessment & Plan Note (Signed)
Improved.  Follow.

## 2016-11-24 NOTE — Progress Notes (Signed)
Cardiac Individual Treatment Plan  Patient Details  Name: Morgan Moore MRN: [id number] Date of Birth: 1956/07/28 Referring Provider:   Flowsheet Row Cardiac Rehab from 07/06/2016 in [facility] Cardiac and Pulmonary Rehab  Referring Provider  Lujean Amel MD      Initial Encounter Date:  Flowsheet Row Cardiac Rehab from 07/06/2016 in [facility] Cardiac and Pulmonary Rehab  Date  07/06/16  Referring Provider  Lujean Amel MD      Visit Diagnosis: NSTEMI (non-ST elevated myocardial infarction) [facility])  Patient's Home Medications on Admission:  Current Outpatient Prescriptions:  .  aspirin EC 81 MG tablet, Take 0.81 mg by mouth daily., Disp: , Rfl:  .  carvedilol (COREG) 6.25 MG tablet, Take 6.25 mg by mouth 2 (two) times daily. 3.1 IN THE AM AND 3.1 AT NIGHT, Disp: , Rfl:  .  clopidogrel (PLAVIX) 75 MG tablet, Take 75 mg by mouth daily., Disp: , Rfl:  .  EPINEPHrine 0.3 mg/0.3 mL IJ SOAJ injection, 0.3 mg as needed., Disp: , Rfl:  .  escitalopram (LEXAPRO) 20 MG tablet, 10 mg., Disp: , Rfl:  .  fexofenadine (ALLEGRA) 180 MG tablet, Take 180 mg by mouth daily., Disp: , Rfl:  .  fosfomycin (MONUROL) 3 g PACK, Take 3 g by mouth once., Disp: , Rfl:  .  Mesalamine (DELZICOL) 400 MG CPDR DR capsule, Take 6 capsules (2,400 mg total) by mouth daily., Disp: 180 capsule, Rfl: 2 .  omeprazole (PRILOSEC) 40 MG capsule, Take 40 mg by mouth daily. Reported on 04/13/2016, Disp: , Rfl:  .  ondansetron (ZOFRAN ODT) 4 MG disintegrating tablet, Take 1 tablet (4 mg total) by mouth every 8 (eight) hours as needed for nausea or vomiting., Disp: 20 tablet, Rfl: 0 .  rizatriptan (MAXALT) 5 MG tablet, USE AS DIRECTED, Disp: 10 tablet, Rfl: 0 .  rosuvastatin (CRESTOR) 5 MG tablet, Take 1 tablet (5 mg total) by mouth daily., Disp: 30 tablet, Rfl: 1 .  tobramycin-dexamethasone (TOBRADEX) ophthalmic solution, Place 1 drop into the right eye every 6 (six) hours., Disp: 5 mL, Rfl: 0 .  verapamil (CALAN) 40 MG tablet,  Take by mouth., Disp: , Rfl:   Past Medical History: Past Medical History:  Diagnosis Date  . Allergy   . Chicken pox   . Coronary artery disease   . Heart murmur   . Hyperlipidemia   . Hypertension   . Migraines    hormonal, puberty  . Nephrolithiasis   . Phlebitis     Tobacco Use: History  Smoking Status  . Never Smoker  Smokeless Tobacco  . Never Used    Labs: Recent Review Flowsheet Data    Labs for ITP Cardiac and Pulmonary Rehab Latest Ref Rng & Units 09/24/2014 02/01/2015 11/07/2015 04/09/2016   Cholestrol 0 - 200 mg/dL 191 249(A) 254(H) 258(H)   LDLCALC 0 - 99 mg/dL 107(H) 168 169(H) 178(H)   HDL >39.00 mg/dL 50 49 56.50 54.20   Trlycerides 0.0 - 149.0 mg/dL 172 198(A) 142.0 131.0       Exercise Target Goals:    Exercise Program Goal: Individual exercise prescription set with THRR, safety & activity barriers. Participant demonstrates ability to understand and report RPE using BORG scale, to self-measure pulse accurately, and to acknowledge the importance of the exercise prescription.  Exercise Prescription Goal: Starting with aerobic activity 30 plus minutes a day, 3 days per week for initial exercise prescription. Provide home exercise prescription and guidelines that participant acknowledges understanding prior to discharge.  Activity Barriers &  Risk Stratification:     Activity Barriers & Cardiac Risk Stratification - 07/06/16 1409      Activity Barriers & Cardiac Risk Stratification   Activity Barriers History of Falls;Balance Concerns;Deconditioning;Muscular Weakness;Other (comment)   Comments occasional dizzy spells, r hip bone spurs      6 Minute Walk:     6 Minute Walk    Row Name 07/06/16 1404         6 Minute Walk   Phase Initial     Distance 1100 feet     Walk Time 6 minutes     # of Rest Breaks 0     MPH 2.08     METS 3.14     RPE 11     VO2 Peak 11     Symptoms No     Resting HR 78 bpm     Resting BP 128/64     Max Ex. HR  96 bpm     Max Ex. BP 158/70     2 Minute Post BP 126/64        Initial Exercise Prescription:     Initial Exercise Prescription - 07/06/16 1400      Date of Initial Exercise RX and Referring Provider   Date 07/06/16   Referring Provider Lujean Amel MD     Treadmill   MPH 1.8   Grade 0.5   Minutes 15   METs 2.5     NuStep   Level 1   Minutes 15   METs 2     Biostep-RELP   Level 1   Minutes 15   METs 2     Prescription Details   Frequency (times per week) 3   Duration Progress to 45 minutes of aerobic exercise without signs/symptoms of physical distress     Intensity   THRR 40-80% of Max Heartrate 110-143   Ratings of Perceived Exertion 11-15   Perceived Dyspnea 0-4     Progression   Progression Continue to progress workloads to maintain intensity without signs/symptoms of physical distress.     Resistance Training   Training Prescription Yes   Weight 2 lbs   Reps 10-15      Perform Capillary Blood Glucose checks as needed.  Exercise Prescription Changes:     Exercise Prescription Changes    Row Name 07/06/16 1300 07/16/16 1000 07/29/16 1400 08/12/16 1200 08/27/16 1100     Exercise Review   Progression  -  - Yes Yes Yes     Response to Exercise   Blood Pressure (Admit) 128/84 144/68 142/82 124/70 138/88   Blood Pressure (Exercise) 158/70 162/82 138/60 134/70 192/78   Blood Pressure (Exit) 126/64 138/82 132/72 144/82 148/76   Heart Rate (Admit) 83 bpm 73 bpm 65 bpm 72 bpm 88 bpm   Heart Rate (Exercise) 96 bpm 97 bpm 99 bpm 90 bpm 99 bpm   Heart Rate (Exit) 78 bpm 75 bpm 89 bpm 68 bpm 72 bpm   Rating of Perceived Exertion (Exercise) 11 11 12 12 13    Duration  -  -  - Progress to 45 minutes of aerobic exercise without signs/symptoms of physical distress Progress to 45 minutes of aerobic exercise without signs/symptoms of physical distress   Intensity  -  -  - THRR unchanged THRR unchanged     Progression   Progression  -  -  - Continue to  progress workloads to maintain intensity without signs/symptoms of physical distress. Continue to progress workloads to maintain intensity  without signs/symptoms of physical distress.   Average METs  - 2.63 1.75 1.7 2.9     Resistance Training   Training Prescription  - Yes Yes No No   Weight  - 2 2 -  doesnt do due to back  pain  -   Reps  - 10-15 10-15  -  -     Interval Training   Interval Training  - No No No No     Treadmill   MPH  - 1  -  - 2   Grade  - 0  -  - 0.5   Minutes  - 15  -  - 15   METs  - 1.77  -  - 2.81     NuStep   Level  -  - 3 3 3    Minutes  -  - 15 15 15    METs  -  - 1.5 1.4 3     Biostep-RELP   Level  - 1 1 1   -   Minutes  - 15 15 15   -   METs  -  - 2 2  -   Row Name 09/09/16 0900 09/10/16 1200 09/24/16 1100         Exercise Review   Progression Yes  - Yes       Response to Exercise   Blood Pressure (Admit)  -  - 152/78     Blood Pressure (Exercise) 156/80  - 142/68     Blood Pressure (Exit) 148/80  - 148/82     Heart Rate (Admit) 76 bpm  - 59 bpm     Heart Rate (Exercise) 102 bpm  - 91 bpm     Heart Rate (Exit) 68 bpm  - 70 bpm     Rating of Perceived Exertion (Exercise) 12  - 12     Duration Progress to 45 minutes of aerobic exercise without signs/symptoms of physical distress  - Progress to 45 minutes of aerobic exercise without signs/symptoms of physical distress     Intensity THRR unchanged  - THRR unchanged       Progression   Progression Continue to progress workloads to maintain intensity without signs/symptoms of physical distress.  - Continue to progress workloads to maintain intensity without signs/symptoms of physical distress.     Average METs 2.2  - 2.4       Resistance Training   Training Prescription No  - No       Interval Training   Interval Training No  - No       Treadmill   MPH  -  - 2     Grade  -  - 0.5     Minutes  -  - 15     METs  -  - 2.81       NuStep   Level 3  - 3     Minutes 15  - 15     METs 2.2  -  2       Home Exercise Plan   Plans to continue exercise at  [facility] (comment)  -     Frequency  - Add 2 additional days to program exercise sessions.  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 First full day of exercise!  Janice was oriented to gym and equipment including functions, settings, policies, and procedures.  Eureka's individual exercise prescription and treatment plan were reviewed.  All starting workloads were established based on the results of the 6 minute walk test done at initial orientation visit.  The plan for exercise progression was also introduced and progression will be customized based on patient's performance and goals. Shaasia sees her PCP today so cannot attend. She called this morning and was concerned that her back was sore aroun T8-9 where she had fractures years ago.  I advised she use less weight or no weight next time and continue to monitor her pain.  She says it was already hurting last week before she came to class. Jacquelyne has complained her heart is really racing after she leaves Cardiac Rehab. Heart rate 106 on treadmill in Cardiac Rehab only exercising at 1.27mh and 0.5% incline.I had KKerianadrink 200 cc of water. I mentioned deconditioning and KAreebatold me she "was pulling brush before her heart attack." KDeijasaid she feels part of it is due to her urinary tract infection that she has now. KParnikais very concerned that her heart "is racing for hours after she leaves Cardiac Rehab. Heart rate at the end of Cardiac Rehab was 86 after cool down and relaxation. KVanis progressing well with exercise.   Row Name 08/12/16 1302 08/27/16 1109 09/09/16 0954 09/10/16 1232 09/24/16 1132   Exercise Comments KAmadeais progressing well with exercise and  modifying as needed for her back. KTimeshais progressing well with exercise. KKristellis progressing well with exercise. Home exercise was reviewed along with THR, RPE and safety precautions.  [facility] with exercise when she attends.  She has been on antibiotics for UTI for an extended time that she states make her not feel good in general, and makes progressing  exercise more difficult.   Row Name 10/07/16 1354           Exercise Comments KZoeyhas not attended since 09/16/16.          Discharge Exercise Prescription (Final Exercise Prescription Changes):     Exercise Prescription Changes - 09/24/16 1100      Exercise Review   Progression Yes     Response to Exercise   Blood Pressure (Admit) 152/78   Blood Pressure (Exercise) 142/68   Blood Pressure (Exit) 148/82   Heart Rate (Admit) 59 bpm   Heart Rate (Exercise) 91 bpm   Heart Rate (Exit) 70 bpm   Rating of Perceived Exertion (Exercise) 12   Duration Progress to 45 minutes of aerobic exercise without signs/symptoms of physical distress   Intensity THRR unchanged     Progression   Progression Continue to progress workloads to maintain intensity without signs/symptoms of physical distress.   Average METs 2.4     Resistance Training   Training Prescription No     Interval Training   Interval Training No     Treadmill   MPH 2   Grade 0.5   Minutes 15   METs 2.81     NuStep   Level 3   Minutes 15   METs 2      Nutrition:  Target Goals: Understanding of nutrition guidelines, daily intake of sodium <15024m cholesterol <20072mcalories 30% from fat and 7% or less from saturated fats, daily to have  5 or more servings of fruits and vegetables.  Biometrics:     Pre Biometrics - 07/06/16 1418      Pre Biometrics   Height 5' 3"  (1.6 m)   Weight 152 lb 8 oz (69.2 kg)   Waist Circumference 33 inches   Hip Circumference 40.5 inches   Waist to Hip Ratio  0.81 %   BMI (Calculated) 27.1   Single Leg Stand 0 seconds       Nutrition Therapy Plan and Nutrition Goals:     Nutrition Therapy & Goals - 07/06/16 1442      Intervention Plan   Intervention Prescribe, educate and counsel regarding individualized specific dietary modifications aiming towards targeted core components such as weight, hypertension, lipid management, diabetes, heart failure and other comorbidities.   Expected Outcomes Short Term Goal: Understand basic principles of dietary content, such as calories, fat, sodium, cholesterol and nutrients.;Short Term Goal: A plan has been developed with personal nutrition goals set during dietitian appointment.;Long Term Goal: Adherence to prescribed nutrition plan.      Nutrition Discharge: Rate Your Plate Scores:     Nutrition Assessments - 07/06/16 1443      Rate Your Plate Scores   Pre Score 57   Pre Score % 63 %      Nutrition Goals Re-Evaluation:     Nutrition Goals Re-Evaluation    Row Name 11/23/16 1631             Personal Goal #1 Re-Evaluation   Personal Goal #1 Eating heart healthy       Goal Progress Seen Yes          Psychosocial: Target Goals: Acknowledge presence or absence of depression, maximize coping skills, provide positive support system. Participant is able to verbalize types and ability to use techniques and skills needed for reducing stress and depression.  Initial Review & Psychosocial Screening:     Initial Psych Review & Screening - 07/06/16 1453      Family Dynamics   Concerns --  Attended couseling after daughter's death      Quality of Life Scores:     Quality of Life - 07/06/16 1438      Quality of Life Scores   Health/Function Pre 20.92 %   Socioeconomic Pre 27.92 %   Psych/Spiritual Pre 23.33 %   Family Pre 29.38 %   GLOBAL Pre 24.03 %      PHQ-9: Recent Review Flowsheet Data    Depression screen [facility] 2/9 10/05/2016 07/06/2016   Decreased Interest 0 3   Down,  Depressed, Hopeless 0 0   PHQ - 2 Score 0 3   Altered sleeping - 0   Tired, decreased energy - 3   Change in appetite - 0   Feeling bad or failure about yourself  - 0   Trouble concentrating - 0   Moving slowly or fidgety/restless - 0   Suicidal thoughts - 0   PHQ-9 Score - 6   Difficult doing work/chores - Very difficult      Psychosocial Evaluation and Intervention:     Psychosocial Evaluation - 07/15/16 1733      Psychosocial Evaluation & Interventions   Interventions Encouraged to exercise with the program and follow exercise prescription   Comments Counselor met with Ms. Tamera Punt today for initial psychosocial evaluation.  She is a 61 year old who reports "passing out" at a wedding in July due to a heart attack and resulted in surgery with (2) stents inserted.  She lives alone, but reports having a strong support system with sisters and a brother who live close by and "tons of friends" and church family who are involved in her life.  Ms. C states she just recovered from a UTI and has been feeling extremely weak since that time.  She sleeps "okay" with about (5) hours each night.  She reports to having been very sedentary since the heart attack.  Ms. C states her appetite is improving since the UTI.  She denies a history of depression or anxiety or any current symptoms and states her mood is typically positive.  Ms. C lost a daughter 5 years ago and a niece passed away this past 06-12-2023, so she has been grieving those losses.  She has a son who just got married and lives 2 hours away which brings her "great joy."  Ms. Loletha Grayer has goals to be stronger and not dizzy as much.  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   Counselor provided supportive services and will continue to follow with Ms. C throughout the course of this program.        Psychosocial Re-Evaluation:     Psychosocial  Re-Evaluation    Row Name 07/24/16 1316 08/10/16 1734 08/24/16 1724 11/23/16 1633       Psychosocial Re-Evaluation   Interventions Encouraged to attend Cardiac Rehabilitation for the exercise  -  -  -    Comments I called Oswaldo Done to check on her. Toneisha said last night was a little better with her heart rate after Cardiac Rehab. I suggested to Zyonna to drink a lot of water before she gets on the treadmill. Lakendra said she finally felt able to drive for the first time in a long time today since before she felt too weak to drive due to her UTI etc. Alysson said her 40 years old daughter died several years ago. Her 71 year old daughter was hit by a truck on her way home from teaching school. Shavontae reported that her 21 year old niece died of a massive heart attack a couple of weeks ago and left a 62 year old and twins 63 years old.Santiago Glad said they have a bad family history of heart disease-ie her father died of a massive heart attack and Raini reported that after the ambulance took her father to the hospital she said she her brother that she needed to take him to the hospital and she never got him there -he died of a heart attack also. Argusta said she divorced her husband. 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 Follow up with Ms. Wagman today reporting had another drug reaction last Friday and hopefully the new drug for blood pressure is going to help with decreasing her light headedness and have no negative side effects.  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  This is a huge loss for her.  Counselor discussed leaning on her friends and her faith as ways to cope, in addition to consistent exercise.  She maintains that her mood continues to be mostly positive, but counselor is concerned about all of her losses  and will continue to  follow with Ms. C and provide supportive services.   Ms. Steveson reports she is feeling somewhat better and stronger lately; as she is not as dizzy; is over the UTI finally; and has more energy in general.   She also reports she has been driving a little lately; which is encouraging.  She plans to have a Halloween party and is preparing for this; which is an annual event she does.  Counselor commended Ms. Winterrowd for hanging in there during all her health and work related disappointments and making the most of life currently.   Aileana said she has good and bad days emotionally since her daughter got killed by a tractor trailer several years ago.    Continued Psychosocial Services Needed Yes  -  -  -       Vocational Rehabilitation: Provide vocational rehab assistance to qualifying candidates.   Vocational Rehab Evaluation & Intervention:     Vocational Rehab - 07/06/16 1453      Initial Vocational Rehab Evaluation & Intervention   Assessment shows need for Vocational Rehabilitation No      Education: Education Goals: Education classes will be provided on a weekly basis, covering required topics. Participant will state understanding/return demonstration of topics presented.  Learning Barriers/Preferences:     Learning Barriers/Preferences - 07/06/16 1452      Learning Barriers/Preferences   Learning Barriers None   Learning Preferences None      Education Topics: General Nutrition Guidelines/Fats and Fiber: -Group instruction provided by verbal, written material, models and posters to present the general guidelines for heart healthy nutrition. Gives an explanation and review of dietary fats and fiber. Flowsheet Row Cardiac Rehab from 09/14/2016 in [facility] Cardiac and Pulmonary Rehab  Date  07/27/16  Educator  P. Leonel Ramsay, [place]  Instruction Review Code  2- meets goals/outcomes      Controlling Sodium/Reading Food Labels: -Group verbal and written material  supporting the discussion of sodium use in heart healthy nutrition. Review and explanation with models, verbal and written materials for utilization of the food label.   Exercise Physiology & Risk Factors: - Group verbal and written instruction with models to review the exercise physiology of the cardiovascular system and associated critical values. Details cardiovascular disease risk factors and the goals associated with each risk factor. Flowsheet Row Cardiac Rehab from 09/14/2016 in [facility] Cardiac and Pulmonary Rehab  Date  08/10/16  Educator  [facility]  Instruction Review Code  2- meets goals/outcomes      Aerobic Exercise & Resistance Training: - Gives group verbal and written discussion on the health impact of inactivity. On the components of aerobic and resistive training programs and the benefits of this training and how to safely progress through these programs. Flowsheet Row Cardiac Rehab from 09/14/2016 in [facility] Cardiac and Pulmonary Rehab  Date  08/12/16  Educator  AS  Instruction Review Code  2- meets goals/outcomes      Flexibility, Balance, General Exercise Guidelines: - Provides group verbal and written instruction on the benefits of flexibility and balance training programs. Provides general exercise guidelines with specific guidelines to those with heart or lung disease. Demonstration and skill practice provided. Flowsheet Row Cardiac Rehab from 09/14/2016 in [facility] Cardiac and Pulmonary Rehab  Date  08/17/16  Educator  [facility]  Instruction Review Code  2- meets goals/outcomes      Stress Management: - Provides group verbal and written instruction about the health risks of elevated stress, cause of high stress, and healthy ways to reduce stress.  Flowsheet Row Cardiac Rehab from 09/14/2016 in [facility] Cardiac and Pulmonary Rehab  Date  08/26/16  Educator  [facility]  Instruction Review Code  2- meets goals/outcomes      Depression: - Provides group verbal and written instruction on the  correlation between heart/lung disease and depressed mood, treatment options, and the stigmas associated with seeking treatment. Flowsheet Row Cardiac Rehab from 09/14/2016 in [facility] Cardiac and Pulmonary Rehab  Date  07/29/16  Educator  [facility]  Instruction Review Code  2- meets goals/outcomes      Anatomy & Physiology of the Heart: - Group verbal and written instruction and models provide basic cardiac anatomy and physiology, with the coronary electrical and arterial systems. Review of: AMI, Angina, Valve disease, Heart Failure, Cardiac Arrhythmia, Pacemakers, and the ICD. Flowsheet Row Cardiac Rehab from 09/14/2016 in [facility] Cardiac and Pulmonary Rehab  Date  08/24/16  Educator  CE  Instruction Review Code  2- meets goals/outcomes      Cardiac Procedures: - Group verbal and written instruction and models to describe the testing methods done to diagnose heart disease. Reviews the outcomes of the test results. Describes the treatment choices: Medical Management, Angioplasty, or Coronary Bypass Surgery. Flowsheet Row Cardiac Rehab from 09/14/2016 in [facility] Cardiac and Pulmonary Rehab  Date  08/31/16  Educator  CE  Instruction Review Code  2- meets goals/outcomes      Cardiac Medications: - Group verbal and written instruction to review commonly prescribed medications for heart disease. Reviews the medication, class of the drug, and side effects. Includes the steps to properly store meds and maintain the prescription regimen. Flowsheet Row Cardiac Rehab from 09/14/2016 in [facility] Cardiac and Pulmonary Rehab  Date  09/07/16  Educator  SB  Instruction Review Code  2- meets goals/outcomes      Go Sex-Intimacy & Heart Disease, Get SMART - Goal Setting: - Group verbal and written instruction through game format to discuss heart disease and the return to sexual intimacy. Provides group verbal and written material to discuss and apply goal setting through the application of the S.M.A.R.T.  Method. Flowsheet Row Cardiac Rehab from 09/14/2016 in [facility] Cardiac and Pulmonary Rehab  Date  08/31/16  Educator  CE  Instruction Review Code  2- meets goals/outcomes      Other Matters of the Heart: - Provides group verbal, written materials and models to describe Heart Failure, Angina, Valve Disease, and Diabetes in the realm of heart disease. Includes description of the disease process and treatment options available to the cardiac patient. Flowsheet Row Cardiac Rehab from 09/14/2016 in [facility] Cardiac and Pulmonary Rehab  Date  08/24/16  Educator  CE  Instruction Review Code  2- meets goals/outcomes      Exercise & Equipment Safety: - Individual verbal instruction and demonstration of equipment use and safety with use of the equipment. Flowsheet Row Cardiac Rehab from 09/14/2016 in [facility] Cardiac and Pulmonary Rehab  Date  07/06/16  Educator   SB  Instruction Review Code  2- meets goals/outcomes      Infection Prevention: - Provides verbal and written material to individual with discussion of infection control including proper hand washing and proper equipment cleaning during exercise session. Flowsheet Row Cardiac Rehab from 09/14/2016 in [facility] Cardiac and Pulmonary Rehab  Date  07/06/16  Educator  SB  Instruction Review Code  2- meets goals/outcomes      Falls Prevention: - Provides verbal and written material to individual with discussion of falls prevention and safety. [place] [facility]  from 09/14/2016 in [facility] Cardiac and Pulmonary Rehab  Date  07/06/16  Educator  SB  Instruction Review Code  2- meets goals/outcomes      Diabetes: - Individual verbal and written instruction to review signs/symptoms of diabetes, desired ranges of glucose level fasting, after meals and with exercise. Advice that pre and post exercise glucose checks will be done for 3 sessions at entry of program.    Knowledge Questionnaire Score:     Knowledge Questionnaire Score - 07/06/16  1452      Knowledge Questionnaire Score   Pre Score 23/28      Core Components/Risk Factors/Patient Goals at Admission:     Personal Goals and Risk Factors at Admission - 07/06/16 1449      Core Components/Risk Factors/Patient Goals on Admission   Sedentary Yes   Intervention Provide advice, education, support and counseling about physical activity/exercise needs.;Develop an individualized exercise prescription for aerobic and resistive training based on initial evaluation findings, risk stratification, comorbidities and participant's personal goals.   Expected Outcomes Achievement of increased cardiorespiratory fitness and enhanced flexibility, muscular endurance and strength shown through measurements of functional capacity and personal statement of participant.   Increase Strength and Stamina Yes   Intervention Provide advice, education, support and counseling about physical activity/exercise needs.;Develop an individualized exercise prescription for aerobic and resistive training based on initial evaluation findings, risk stratification, comorbidities and participant's personal goals.   Expected Outcomes Achievement of increased cardiorespiratory fitness and enhanced flexibility, muscular endurance and strength shown through measurements of functional capacity and personal statement of participant.   Hypertension Yes   Intervention Provide education on lifestyle modifcations including regular physical activity/exercise, weight management, moderate sodium restriction and increased consumption of fresh fruit, vegetables, and low fat dairy, alcohol moderation, and smoking cessation.;Monitor prescription use compliance.   Expected Outcomes Short Term: Continued assessment and intervention until BP is < 140/35m HG in hypertensive participants. < 130/855mHG in hypertensive participants with diabetes, heart failure or chronic kidney disease.;Long Term: Maintenance of blood pressure at goal  levels.   Lipids Yes   Intervention Provide education and support for participant on nutrition & aerobic/resistive exercise along with prescribed medications to achieve LDL <7082mHDL >59m25m Expected Outcomes Short Term: Participant states understanding of desired cholesterol values and is compliant with medications prescribed. Participant is following exercise prescription and nutrition guidelines.;Long Term: Cholesterol controlled with medications as prescribed, with individualized exercise RX and with personalized nutrition plan. Value goals: LDL < 70mg13mL > 40 mg.      Core Components/Risk Factors/Patient Goals Review:      Goals and Risk Factor Review    Row Name 07/24/16 1222 08/12/16 1736 09/10/16 1228 11/23/16 1632       Core Components/Risk Factors/Patient Goals Review   Personal Goals Review  - Sedentary;Increase Strength and Stamina;Hypertension;Lipids;Stress;Weight Management/Obesity Lipids;Increase Strength and Stamina Increase Strength and Stamina;Hypertension;Lipids    Review KarenRakebcomplained her heart is really racing after she leaves Cardiac Rehab. Heart rate 106 on treadmill in Cardiac Rehab only exercising at 1.8mph 74m 0.5% incline.I had Zali Akua 200 cc of water. I mentioned deconditioning and Leean Wesleighme she "was pulling brush before her heart attack." Kinsly Addyshe feels part of it is due to her urinary tract infection that she has now. Adele Dawannary concerned that her heart "is racing for hours after she leaves Cardiac Rehab. Heart rate at the end of Cardiac Rehab was 86 after cool down and relaxation. Demoni's weight  has started to creep up as she is taking her rides to rehab out to dinner.  She is still having issues with her UTI, Chron's, and bad drug reactions.  She is still coping with a lot of stress and recently was told that they would not be holding her job after 10/26.  Will let counselor know so that she can follow up as well.  She has noted that  exercise is starting to help her feel better as she is able to do more at home than previously.  She is still concerned that she cannot do as much as before, but reasssured that she is still in the recovery phase and fighting other infections.  She has not had her labs tested recently.  Her blood pressure was good today, but she continues to have episodes of low pressure and is wearing an event monitor. Karynn reports being able to vaccum, mop and wash window with less fatigue than before.  She is pleased to be able to do these chores.  She is not taking cholesterol medication now on Dr orders due to other health issues. Gizell is still able to do her chores which she is glad for. Her blood pressure has been stable. Her MD will follow up with blood work for her lipid panel.    Expected Outcomes Quality exercise/conditioned. Dorothey will continue to come to exercise and education classes to work on risk factor modification and for support.  We will continue to monitor. Kloi will continue to build strength and endurance and be able to complete ADLs with more ease.  She will follow up with her Dr about cholesterol.   Cont to build up her endurance and stamina.       Core Components/Risk Factors/Patient Goals at Discharge (Final Review):      Goals and Risk Factor Review - 11/23/16 1632      Core Components/Risk Factors/Patient Goals Review   Personal Goals Review Increase Strength and Stamina;Hypertension;Lipids   Review Kynsleigh is still able to do her chores which she is glad for. Her blood pressure has been stable. Her MD will follow up with blood work for her lipid panel.   Expected Outcomes Cont to build up her endurance and stamina.      ITP Comments:     ITP Comments    Row Name 07/06/16 1438 07/15/16 1746 07/16/16 1035 07/22/16 0654 07/23/16 1648   ITP Comments Initial ITP created during [facility] Review. Diagnosis documentation can be found [facility] encounter 06/16/2016 First full day of  exercise!  Alannah was oriented to gym and equipment including functions, settings, policies, and procedures.  Pernella's individual exercise prescription and treatment plan were reviewed.  All starting workloads were established based on the results of the 6 minute walk test done at initial orientation visit.  The plan for exercise progression was also introduced and progression will be customized based on patient's performance and goals. Madason sees her PCP today so cannot attend. She called this morning and was concerned that her back was sore aroun T8-9 where she had fractures years ago.  I advised she use less weight or no weight next time and continue to monitor her pain.  She says it was already hurting last week before she came to class. 30 day review. Continue with ITP unless changes noted by Medical Director at signature of review.  Has started program this week Kaela instructed to contact her doctor's office since she c/o of her heart rate going  fast in the evening when she leaves here. Danely reported she is still on 169m Verapamil. KLezliereported that tha MD increased her verapamil to 1655mbut decreased it when she c/o being tired. KaAmmyn treadmill at 1.36m39mheart rate 100 even after drinking  240 cc water.    RowNadineme 07/24/16 1222 07/24/16 1315 07/31/16 1219 07/31/16 1220 08/03/16 1628   ITP Comments I sent a note via [facility] to Dr. CalClayborn Bignessday-Kahlyn has complained her heart is really racing after she leaves Cardiac Rehab. Heart rate 106 on treadmill in Cardiac Rehab only exercising at 1.8mp20mnd 0.5% incline.I had KareDeonink 200 cc of water. I mentioned deconditioning and KareBerdinad me she "was pulling brush before her heart attack." KareChristled she feels part of it is due to her urinary tract infection that she has now. KareShatinavery concerned that her heart "is racing for hours after she leaves Cardiac Rehab. Heart rate at the end of Cardiac Rehab was 86 after cool down and relaxation. I called  KareNahjae Hoegcheck on her. KareJanaiyahd last night was a little better with her heart rate after Cardiac Rehab. I suggested to KareMeaghandrink a lot of water before she gets on the treadmill. KareTamud she finally felt able to drive for the first time in a long time today since before she felt too weak to drive due to her UTI etc. KareMaryloud her 22 y29rs old daughter died several years ago. Her 22 y23r old daughter was hit by a truck on her way home from teaching school. KareLuaraorted that her 48 y28r old niece died of a massive heart attack a couple of weeks ago and left a 13 y67r old and twins 23 y53rs old.. KaSantiago Gladd they have a bad family history of heart disease-ie her father died of a massive heart attack and KareDeshaunorted that after the ambulance took her father to the hospital she said she her brother that she needed to take him to the hospital and she never got him there -he died of a heart attack also. KareJencyd she divorced her husband. 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 KareSheltont a vm on Cardiac Rehab phone and said that she did drive her on WEd 9/202/54 Cardiac REhab but she felt so weak that she didn't think she should do Cardiac Rehab. KareAniquestill struggling with feeling weak which keeps her for exercising.  KareMarellaled to tell us [facility] [facility]  [facility].  She also stated that she is being evaluated for a leaking valve.    Row Name 08/17/16 1825 08/19/16 [id number] 0620 10/07/16 1354 10/14/16 0615   ITP Comments KareProvidenceve to class for the first time today. She is feeling better, now that the UTI she has had over a month is better.  30 day review. Continue with ITP unless changes noted by Medical Director at signature of review. HAs been out medical concerns. Returning feeling better. 30 day review completed for Medical Director physician review and signature. Continue ITP unless changes made by  physician. KareKasmira not attended since 09/16/16. 30 day review completed for review by Dr MarkEmily Filbertontinue with ITP unless changes noted by Dr MIllSabra HeckRow Wilkinsone 10/14/16 [id number] 1631       ITP Comments KareLorree been out with medial concerns  and hopes to return once cleared by MD 30 day review. Continue with ITP unless directed changes per Medical Director review. Remains out Janessa requested to be discharged from Cardiac Rehab since the first of the year her insurance is charging her for Cardiac Rehab.         Comments: Discharged early per Channah's request due to insurance. She attended 30+ sessions/36.

## 2016-11-30 ENCOUNTER — Telehealth: Payer: Self-pay | Admitting: Internal Medicine

## 2016-11-30 ENCOUNTER — Telehealth: Payer: Self-pay | Admitting: *Deleted

## 2016-11-30 ENCOUNTER — Encounter: Payer: Self-pay | Admitting: Internal Medicine

## 2016-11-30 NOTE — Telephone Encounter (Signed)
Please fax to [phone] Attn: Olevia Bowens

## 2016-11-30 NOTE — Telephone Encounter (Signed)
Patient advised of below . Letter faxed

## 2016-11-30 NOTE — Telephone Encounter (Signed)
Letter printed, signed and placed in box.  I also completed a previous form that she should have as well.   Thanks

## 2016-11-30 NOTE — Telephone Encounter (Signed)
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 Also needs it to say that she couldn't lift or move heavy  furniture.Marland Kitchen PT NEEDS THIS FOR THE  BOARD MEETING TONIGHT.Marland Kitchen Please advise

## 2016-12-30 ENCOUNTER — Other Ambulatory Visit: Payer: BC Managed Care – PPO

## 2017-01-01 ENCOUNTER — Other Ambulatory Visit (INDEPENDENT_AMBULATORY_CARE_PROVIDER_SITE_OTHER): Payer: BC Managed Care – PPO

## 2017-01-01 DIAGNOSIS — E7800 Pure hypercholesterolemia, unspecified: Secondary | ICD-10-CM

## 2017-01-01 DIAGNOSIS — I10 Essential (primary) hypertension: Secondary | ICD-10-CM

## 2017-01-01 LAB — CBC WITH DIFFERENTIAL/PLATELET
BASOS ABS: 0.1 10*3/uL (ref 0.0–0.1)
BASOS PCT: 1.1 % (ref 0.0–3.0)
EOS ABS: 1.1 10*3/uL — AB (ref 0.0–0.7)
Eosinophils Relative: 14.7 % — ABNORMAL HIGH (ref 0.0–5.0)
HEMATOCRIT: 37.7 % (ref 36.0–46.0)
HEMOGLOBIN: 12.4 g/dL (ref 12.0–15.0)
LYMPHS PCT: 18.2 % (ref 12.0–46.0)
Lymphs Abs: 1.4 10*3/uL (ref 0.7–4.0)
MCHC: 33 g/dL (ref 30.0–36.0)
MCV: 93.5 fl (ref 78.0–100.0)
MONO ABS: 0.8 10*3/uL (ref 0.1–1.0)
Monocytes Relative: 10.8 % (ref 3.0–12.0)
Neutro Abs: 4.3 10*3/uL (ref 1.4–7.7)
Neutrophils Relative %: 55.2 % (ref 43.0–77.0)
Platelets: 357 10*3/uL (ref 150.0–400.0)
RBC: 4.03 Mil/uL (ref 3.87–5.11)
RDW: 14.6 % (ref 11.5–15.5)
WBC: 7.8 10*3/uL (ref 4.0–10.5)

## 2017-01-01 LAB — BASIC METABOLIC PANEL
BUN: 16 mg/dL (ref 6–23)
CO2: 29 mEq/L (ref 19–32)
CREATININE: 0.96 mg/dL (ref 0.40–1.20)
Calcium: 9.1 mg/dL (ref 8.4–10.5)
Chloride: 105 mEq/L (ref 96–112)
GFR: 62.62 mL/min (ref >60.00)
Glucose, Bld: 92 mg/dL (ref 70–99)
POTASSIUM: 3.8 meq/L (ref 3.5–5.1)
Sodium: 139 mEq/L (ref 135–145)

## 2017-01-01 LAB — LIPID PANEL
CHOL/HDL RATIO: 4
Cholesterol: 196 mg/dL (ref 0–200)
HDL: 51.5 mg/dL (ref >39.00)
LDL CALC: 120 mg/dL — AB (ref 0–99)
NONHDL: 144.47
TRIGLYCERIDES: 123 mg/dL (ref 0.0–149.0)
VLDL: 24.6 mg/dL (ref 0.0–40.0)

## 2017-01-01 LAB — HEPATIC FUNCTION PANEL
ALK PHOS: 81 U/L (ref 39–117)
ALT: 15 U/L (ref 0–35)
AST: 17 U/L (ref 0–37)
Albumin: 4.1 g/dL (ref 3.5–5.2)
BILIRUBIN DIRECT: 0.1 mg/dL (ref 0.0–0.3)
BILIRUBIN TOTAL: 0.5 mg/dL (ref 0.2–1.2)
TOTAL PROTEIN: 6.7 g/dL (ref 6.0–8.3)

## 2017-01-01 NOTE — Addendum Note (Signed)
Addended by: Leeanne Rio on: 01/01/2017 09:13 AM   Modules accepted: Orders

## 2017-01-11 ENCOUNTER — Telehealth: Payer: Self-pay | Admitting: *Deleted

## 2017-01-11 NOTE — Telephone Encounter (Signed)
[employer] has requested a re-summit on a claim from  November 18, 2016. The office visit was claimed under Z02.89 making this visit not covered.  Please call pt when claim is re-summit.  Pt contact [phone]

## 2017-01-15 ENCOUNTER — Ambulatory Visit (INDEPENDENT_AMBULATORY_CARE_PROVIDER_SITE_OTHER): Payer: BC Managed Care – PPO | Admitting: Internal Medicine

## 2017-01-15 ENCOUNTER — Encounter: Payer: Self-pay | Admitting: Internal Medicine

## 2017-01-15 VITALS — BP 124/76 | HR 62 | Temp 98.4°F | Resp 16 | Ht 63.0 in | Wt 161.0 lb

## 2017-01-15 DIAGNOSIS — F439 Reaction to severe stress, unspecified: Secondary | ICD-10-CM

## 2017-01-15 DIAGNOSIS — T7589XA Other specified effects of external causes, initial encounter: Secondary | ICD-10-CM

## 2017-01-15 DIAGNOSIS — N200 Calculus of kidney: Secondary | ICD-10-CM | POA: Diagnosis not present

## 2017-01-15 DIAGNOSIS — I2510 Atherosclerotic heart disease of native coronary artery without angina pectoris: Secondary | ICD-10-CM

## 2017-01-15 DIAGNOSIS — E7800 Pure hypercholesterolemia, unspecified: Secondary | ICD-10-CM | POA: Diagnosis not present

## 2017-01-15 DIAGNOSIS — R531 Weakness: Secondary | ICD-10-CM

## 2017-01-15 DIAGNOSIS — L989 Disorder of the skin and subcutaneous tissue, unspecified: Secondary | ICD-10-CM

## 2017-01-15 DIAGNOSIS — I10 Essential (primary) hypertension: Secondary | ICD-10-CM | POA: Diagnosis not present

## 2017-01-15 DIAGNOSIS — K219 Gastro-esophageal reflux disease without esophagitis: Secondary | ICD-10-CM

## 2017-01-15 DIAGNOSIS — M79601 Pain in right arm: Secondary | ICD-10-CM

## 2017-01-15 LAB — HIV ANTIBODY (ROUTINE TESTING W REFLEX): HIV 1&2 Ab, 4th Generation: NONREACTIVE
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
## 2017-01-15 NOTE — Progress Notes (Signed)
Pre-visit discussion using our clinic review tool. No additional management support is needed unless otherwise documented below in the visit note.  

## 2017-01-16 ENCOUNTER — Encounter: Payer: Self-pay | Admitting: Internal Medicine

## 2017-01-16 DIAGNOSIS — M79601 Pain in right arm: Secondary | ICD-10-CM | POA: Insufficient documentation

## 2017-01-16 DIAGNOSIS — M25511 Pain in right shoulder: Secondary | ICD-10-CM | POA: Insufficient documentation

## 2017-01-16 MED ORDER — METRONIDAZOLE 1 % EX GEL: CUTANEOUS | 0 refills | Status: DC

## 2017-01-16 NOTE — Assessment & Plan Note (Signed)
On low dose crestor.  Taking three days per week.  Will add a day.  Last check improved.  Did not tolerate higher doses.  Follow.

## 2017-01-16 NOTE — Assessment & Plan Note (Signed)
Overall stable.

## 2017-01-16 NOTE — Assessment & Plan Note (Signed)
Doing better.  Follow.

## 2017-01-16 NOTE — Assessment & Plan Note (Signed)
Followed by cardiology.  Doing better.  Continue risk factor modification.  Follow.

## 2017-01-16 NOTE — Assessment & Plan Note (Signed)
Blood pressure under good control.  Continue same medication regimen.  Follow pressures.  Follow metabolic panel.   

## 2017-01-16 NOTE — Assessment & Plan Note (Signed)
Has improved.  Follow.

## 2017-01-16 NOTE — Assessment & Plan Note (Signed)
Controlled on prilosec.  Follow.

## 2017-01-16 NOTE — Assessment & Plan Note (Signed)
Hand, wrist and arm pain as outlined.  Some numbness in her fingers.  Some decreased swelling.  Wearing a support.  Helping.  Has f/u with ortho next week.  After discussion, will hold on xray here.  Follow.

## 2017-02-03 ENCOUNTER — Other Ambulatory Visit: Payer: Self-pay | Admitting: Internal Medicine

## 2017-02-10 ENCOUNTER — Other Ambulatory Visit: Payer: Self-pay | Admitting: Internal Medicine

## 2017-02-10 NOTE — Telephone Encounter (Signed)
Pharmacy requesting refill on Zofran prescribed 01/15/17 ok to fill?

## 2017-03-28 ENCOUNTER — Emergency Department: Payer: BC Managed Care – PPO

## 2017-03-28 ENCOUNTER — Encounter: Payer: Self-pay | Admitting: Emergency Medicine

## 2017-03-28 ENCOUNTER — Emergency Department
Admission: EM | Admit: 2017-03-28 | Discharge: 2017-03-28 | Disposition: A | Discharge status: Home / Self Care | Payer: BC Managed Care – PPO | Attending: Emergency Medicine | Admitting: Emergency Medicine

## 2017-03-28 DIAGNOSIS — Z7982 Long term (current) use of aspirin: Secondary | ICD-10-CM | POA: Diagnosis not present

## 2017-03-28 DIAGNOSIS — R109 Unspecified abdominal pain: Secondary | ICD-10-CM

## 2017-03-28 DIAGNOSIS — Z7902 Long term (current) use of antithrombotics/antiplatelets: Secondary | ICD-10-CM | POA: Insufficient documentation

## 2017-03-28 DIAGNOSIS — I10 Essential (primary) hypertension: Secondary | ICD-10-CM | POA: Insufficient documentation

## 2017-03-28 DIAGNOSIS — I2510 Atherosclerotic heart disease of native coronary artery without angina pectoris: Secondary | ICD-10-CM | POA: Insufficient documentation

## 2017-03-28 DIAGNOSIS — N200 Calculus of kidney: Secondary | ICD-10-CM | POA: Diagnosis not present

## 2017-03-28 DIAGNOSIS — R1031 Right lower quadrant pain: Secondary | ICD-10-CM | POA: Diagnosis present

## 2017-03-28 LAB — CBC
HCT: 36.8 % (ref 35.0–47.0)
Hemoglobin: 12.5 g/dL (ref 12.0–16.0)
MCH: 30 pg (ref 26.0–34.0)
MCHC: 33.8 g/dL (ref 32.0–36.0)
MCV: 88.6 fL (ref 80.0–100.0)
PLATELETS: 342 10*3/uL (ref 150–440)
RBC: 4.16 MIL/uL (ref 3.80–5.20)
RDW: 13.9 % (ref 11.5–14.5)
WBC: 14.8 10*3/uL — AB (ref 3.6–11.0)

## 2017-03-28 LAB — URINALYSIS, COMPLETE (UACMP) WITH MICROSCOPIC
Bilirubin Urine: NEGATIVE
Glucose, UA: NEGATIVE mg/dL
HGB URINE DIPSTICK: NEGATIVE
KETONES UR: NEGATIVE mg/dL
NITRITE: NEGATIVE
PROTEIN: NEGATIVE mg/dL
Specific Gravity, Urine: 1.011 (ref 1.005–1.030)
pH: 7 (ref 5.0–8.0)

## 2017-03-28 LAB — BASIC METABOLIC PANEL
Anion gap: 10 (ref 5–15)
BUN: 19 mg/dL (ref 6–20)
CHLORIDE: 105 mmol/L (ref 101–111)
CO2: 24 mmol/L (ref 22–32)
Calcium: 9.2 mg/dL (ref 8.9–10.3)
Creatinine, Ser: 1.13 mg/dL — ABNORMAL HIGH (ref 0.44–1.00)
GFR calc Af Amer: 59 mL/min — ABNORMAL LOW (ref >60)
GFR, EST NON AFRICAN AMERICAN: 51 mL/min — AB (ref >60)
Glucose, Bld: 140 mg/dL — ABNORMAL HIGH (ref 65–99)
Potassium: 3.6 mmol/L (ref 3.5–5.1)
SODIUM: 139 mmol/L (ref 135–145)

## 2017-03-28 MED ORDER — ONDANSETRON HCL 4 MG/2ML IJ SOLN
INTRAMUSCULAR | Status: AC
  Administered 2017-03-28: 4 mg via INTRAVENOUS
  Filled 2017-03-28: qty 2

## 2017-03-28 MED ORDER — MORPHINE SULFATE (PF) 2 MG/ML IV SOLN
2.0000 mg | Freq: Once | INTRAVENOUS | Status: DC
Start: 2017-03-28 — End: 2017-03-28

## 2017-03-28 MED ORDER — MORPHINE SULFATE (PF) 4 MG/ML IV SOLN
INTRAVENOUS | Status: AC
  Administered 2017-03-28: 2 mg via INTRAVENOUS
  Filled 2017-03-28: qty 1

## 2017-03-28 MED ORDER — MORPHINE SULFATE (PF) 2 MG/ML IV SOLN: 2.0000 mg | Freq: Once | INTRAVENOUS | Status: DC

## 2017-03-28 MED ORDER — KETOROLAC TROMETHAMINE 30 MG/ML IJ SOLN
30.0000 mg | Freq: Once | INTRAMUSCULAR | Status: AC
  Administered 2017-03-28: 30 mg via INTRAVENOUS

## 2017-03-28 MED ORDER — KETOROLAC TROMETHAMINE 30 MG/ML IJ SOLN
INTRAMUSCULAR | Status: AC
  Administered 2017-03-28: 30 mg via INTRAVENOUS
  Filled 2017-03-28: qty 1

## 2017-03-28 MED ORDER — ONDANSETRON HCL 4 MG/2ML IJ SOLN
4.0000 mg | Freq: Once | INTRAMUSCULAR | Status: AC
  Administered 2017-03-28: 4 mg via INTRAVENOUS

## 2017-03-28 MED ORDER — SODIUM CHLORIDE 0.9 % IV BOLUS (SEPSIS)
1000.0000 mL | Freq: Once | INTRAVENOUS | Status: AC
  Administered 2017-03-28: 1000 mL via INTRAVENOUS

## 2017-03-28 NOTE — ED Triage Notes (Signed)
Pt to tx room direction from triage, per triage nurse, pt extremely restless, known kidney stones, right flank pain radiating to right lower abd.

## 2017-03-28 NOTE — ED Provider Notes (Signed)
[facility] Emergency Department Provider Note _   First MD Initiated Contact with Patient 03/28/17 [phone]     (approximate)  I have reviewed the triage vital signs and the nursing notes.   HISTORY  Chief Complaint Flank Pain    HPI Morgan Moore is a 61 y.o. female with below list of chronic medical conditions including previous kidney stones presents with acute onset of sharp 10 out of 10 right flank/lower quadrant abdominal pain with onset 6 hours now. Patient admits to nausea and vomiting. Patient denies any urinary symptoms. Patient states "I notes a kidney stone". She denies any fever or febrile on presentation temperature 97.9.   Past Medical History:  Diagnosis Date  . Allergy   . Chicken pox   . Coronary artery disease   . Heart murmur   . Hyperlipidemia   . Hypertension   . Migraines    hormonal, puberty  . Nephrolithiasis   . Phlebitis     Patient Active Problem List   Diagnosis Date Noted  . Right arm pain 01/16/2017  . History of frequent urinary tract infections 08/09/2016  . Weakness 07/02/2016  . CAD (coronary artery disease) 04/17/2016  . Light headedness 04/17/2016  . Left hip pain 04/13/2016  . Health care maintenance 01/19/2016  . Essential hypertension 11/07/2015  . Hypercholesterolemia 11/07/2015  . Migraine headache 11/07/2015  . Nephrolithiasis 11/07/2015  . GERD (gastroesophageal reflux disease) 11/07/2015  . Mitral regurgitation 11/07/2015  . Stress 11/07/2015    Past Surgical History:  Procedure Laterality Date  . BREAST BIOPSY    . heart murmur    . [place]    Prior to Admission medications   Medication Sig Start Date End Date Taking? Authorizing Provider  aspirin EC 81 MG tablet Take 0.81 mg by mouth daily.    [provider]  carvedilol (COREG) 6.25 MG tablet Take 6.25 mg by mouth 2 (two) times daily. 3.1 IN THE AM AND 3.1 AT NIGHT  08/04/16 08/04/17  [provider]  clopidogrel (PLAVIX) 75 MG tablet Take 75 mg by mouth daily.    [provider]  EPINEPHrine 0.3 mg/0.3 mL IJ SOAJ injection 0.3 mg as needed. 12/18/13   [provider]  escitalopram (LEXAPRO) 20 MG tablet 10 mg. 09/24/16   [provider]  fexofenadine (ALLEGRA) 180 MG tablet Take 180 mg by mouth daily.    [provider]  fosfomycin (MONUROL) 3 g PACK Take 3 g by mouth once.    [provider]  Mesalamine (DELZICOL) 400 MG CPDR DR capsule Take 6 capsules (2,400 mg total) by mouth daily. 01/07/16   Einar Pheasant, MD  metroNIDAZOLE (METROGEL) 1 % gel Apply to affected area on face daily. 01/16/17   Einar Pheasant, MD  omeprazole (PRILOSEC) 40 MG capsule Take 40 mg by mouth daily. Reported on 04/13/2016 04/29/15 04/28/16  [provider]  ondansetron (ZOFRAN-ODT) 4 MG disintegrating tablet TAKE 1 TABLET EVERY 8 HOURS AS NEEDED FOR NAUSEA & VOMITING 02/10/17   Einar Pheasant, MD  rizatriptan (MAXALT) 5 MG tablet USE AS DIRECTED 04/28/16   Einar Pheasant, MD  rosuvastatin (CRESTOR) 5 MG tablet TAKE 1 TABLET BY MOUTH ONCE DAILY. 02/04/17   Einar Pheasant, MD  tobramycin-dexamethasone [facility]) ophthalmic solution Place 1 drop into the right eye every 6 (six) hours. 07/02/16   Einar Pheasant, MD  verapamil (CALAN) 40 MG tablet Take by mouth. 08/27/16 08/27/17  [provider]    Allergies Ciprofloxacin; Keflex [cephalexin]; Lisinopril; Meclizine; Sulfur; and Tessalon [benzonatate]  Family History  Problem Relation Age of Onset  . Heart disease Father   . Heart disease Brother   . Cancer Maternal Aunt     Social History Social History  Substance Use Topics  . Smoking status: Never Smoker  . Smokeless tobacco: Never Used  . Alcohol use No    Review of Systems Constitutional: No fever/chills Eyes: No visual changes. ENT: No sore throat. Cardiovascular: Denies chest pain. Respiratory:  Denies shortness of breath. Gastrointestinal: Positive for right lower quadrant/right flank pain and vomiting. Genitourinary: Negative for dysuria. Musculoskeletal: Negative for neck pain.  Negative for back pain. Integumentary: Negative for rash. Neurological: Negative for headaches, focal weakness or numbness.   ____________________________________________   PHYSICAL EXAM:  VITAL SIGNS: ED Triage Vitals  Enc Vitals Group     BP 03/28/17 0621 (!) 181/80     Pulse Rate 03/28/17 0621 64     Resp 03/28/17 0621 (!) 22     Temp 03/28/17 0621 97.9 F (36.6 C)     Temp Source 03/28/17 0621 Oral     SpO2 03/28/17 0621 100 %     Weight 03/28/17 0616 160 lb (72.6 kg)     Height 03/28/17 0616 5' 3"  (1.6 m)     Head Circumference --      Peak Flow --      Pain Score 03/28/17 0616 10     Pain Loc --      Pain Edu? --      Excl. in [place]? --     Constitutional: Alert and oriented. Apparent discomfort  Eyes: Conjunctivae are normal.  Head: Atraumatic. Mouth/Throat: Mucous membranes are moist.  Oropharynx non-erythematous. Neck: No stridor.   Cardiovascular: Normal rate, regular rhythm. Good peripheral circulation. Grossly normal heart sounds. Respiratory: Normal respiratory effort.  No retractions. Lungs CTAB. Gastrointestinal: Soft and nontender. No distention.  Musculoskeletal: No lower extremity tenderness nor edema. No gross deformities of extremities. Neurologic:  Normal speech and language. No gross focal neurologic deficits are appreciated.  Skin:  Skin is warm, dry and intact. No rash noted. Psychiatric: Mood and affect are normal. Speech and behavior are normal.  ____________________________________________   LABS (all labs ordered are listed, but only abnormal results are displayed)  Labs Reviewed  URINALYSIS, COMPLETE (UACMP) WITH MICROSCOPIC - Abnormal; Notable for the following:       Result Value   Color, Urine STRAW (*)    APPearance CLEAR (*)    Leukocytes, UA  MODERATE (*)    Bacteria, UA RARE (*)    Squamous Epithelial / LPF 0-5 (*)    All other components within normal limits  BASIC METABOLIC PANEL - Abnormal; Notable for the following:    Glucose, Bld 140 (*)    Creatinine, Ser 1.13 (*)    GFR calc non Af Amer 51 (*)    GFR calc Af Amer 59 (*)    All other components within normal limits  CBC - Abnormal; Notable for the following:    WBC 14.8 (*)    All other components within normal limits    RADIOLOGY I, [place] Daeveon Zweber, personally viewed and evaluated these images (plain radiographs) as part of my medical decision making, as well as reviewing the written report by the radiologist.  Ct Renal Stone Study  Result Date: 03/28/2017 CLINICAL DATA:  Right flank pain EXAM: CT ABDOMEN AND PELVIS WITHOUT CONTRAST TECHNIQUE: Multidetector  CT imaging of the abdomen and pelvis was performed following the standard protocol without IV contrast. COMPARISON:  None. FINDINGS: Lower chest: No acute abnormality. Hepatobiliary: 4 cm low-density lesion noted posteriorly in the right hepatic lobe. No other focal hepatic abnormality. Gallbladder unremarkable. Pancreas: No focal abnormality or ductal dilatation. Spleen: No focal abnormality.  Normal size. Adrenals/Urinary Tract: Moderate right hydronephrosis and hydroureter. No ureteral stones. There is a punctate 2 mm stone layering dependently in the urinary bladder, likely recently passed stone. Bilateral nonobstructing punctate stones. Adrenal glands unremarkable. Stomach/Bowel: Scattered sigmoid diverticula. Appendix is normal. Stomach, small bowel unremarkable. Vascular/Lymphatic: Aortic calcifications. No evidence of aneurysm or adenopathy. Reproductive: Uterus and adnexa unremarkable.  No mass. Other: No free fluid or free air. Musculoskeletal: No acute bony abnormality. IMPRESSION: Punctate 2 mm stone layering dependently in the urinary bladder, likely recently passed stone. There is moderate right hydro  nephrosis and hydroureter. Bilateral nephrolithiasis. Sigmoid diverticulosis. Aortic atherosclerosis. 4 cm low-density lesion posteriorly in the liver. This cannot be characterized without intravenous contrast. This could be further evaluated with elective ultrasound for MRI. Electronically Signed   By: Rolm Baptise M.D.   On: 03/28/2017 07:09     Procedures   ____________________________________________   INITIAL IMPRESSION / ASSESSMENT AND PLAN / ED COURSE  Pertinent labs & imaging results that were available during my care of the patient were reviewed by me and considered in my medical decision making (see chart for details).  Patient given IV morphine total of 4 mg and Toradol 30 mg with concern for possible right ureterolithiasis. Patient returned from CAT scan with complete resolution of pain. CT scan consistent with recently passed kidney stone. I evaluated the patient again at 7:30 AM at which point she had 0 pain. Lab data revealed no evidence of urinary tract infection      ____________________________________________  FINAL CLINICAL IMPRESSION(S) / ED DIAGNOSES  Final diagnoses:  Right flank pain  Right kidney stone     MEDICATIONS GIVEN DURING THIS VISIT:  Medications  morphine 2 MG/ML injection 2 mg (2 mg Intravenous Not Given 03/28/17 0938)  morphine 2 MG/ML injection 2 mg (2 mg Intravenous Not Given 03/28/17 1829)  ondansetron (ZOFRAN) injection 4 mg (4 mg Intravenous Given 03/28/17 0625)  morphine 4 MG/ML injection (2 mg Intravenous Given 03/28/17 0626)  sodium chloride 0.9 % bolus 1,000 mL (1,000 mLs Intravenous New Bag/Given 03/28/17 0626)  morphine 4 MG/ML injection (2 mg Intravenous Given 03/28/17 9371)  ketorolac (TORADOL) 30 MG/ML injection 30 mg (30 mg Intravenous Given 03/28/17 6967)     NEW OUTPATIENT MEDICATIONS STARTED DURING THIS VISIT:  New Prescriptions   No medications on file    Modified Medications   No medications on file    Discontinued  Medications   No medications on file     Note:  This document was prepared using Dragon voice recognition software and may include unintentional dictation errors.    Gregor Hams, MD 03/28/17 [phone]

## 2017-03-29 ENCOUNTER — Other Ambulatory Visit: Payer: Self-pay | Admitting: Internal Medicine

## 2017-03-29 NOTE — Telephone Encounter (Signed)
Patient requested a medication refill for escitalopram,  pt was also seen in the ED at [facility] 03/28/17, pt passed a 15m kidney stone.  Pt contact [employer]

## 2017-04-12 ENCOUNTER — Observation Stay
Admission: EM | Admit: 2017-04-12 | Discharge: 2017-04-13 | Disposition: A | Discharge status: Home / Self Care | Payer: BC Managed Care – PPO | Attending: Specialist | Admitting: Specialist

## 2017-04-12 ENCOUNTER — Emergency Department: Payer: BC Managed Care – PPO

## 2017-04-12 ENCOUNTER — Encounter: Payer: Self-pay | Admitting: Emergency Medicine

## 2017-04-12 DIAGNOSIS — Z7902 Long term (current) use of antithrombotics/antiplatelets: Secondary | ICD-10-CM | POA: Insufficient documentation

## 2017-04-12 DIAGNOSIS — K219 Gastro-esophageal reflux disease without esophagitis: Secondary | ICD-10-CM | POA: Insufficient documentation

## 2017-04-12 DIAGNOSIS — F329 Major depressive disorder, single episode, unspecified: Secondary | ICD-10-CM | POA: Diagnosis not present

## 2017-04-12 DIAGNOSIS — I10 Essential (primary) hypertension: Secondary | ICD-10-CM | POA: Insufficient documentation

## 2017-04-12 DIAGNOSIS — Z955 Presence of coronary angioplasty implant and graft: Secondary | ICD-10-CM | POA: Insufficient documentation

## 2017-04-12 DIAGNOSIS — R079 Chest pain, unspecified: Secondary | ICD-10-CM | POA: Diagnosis not present

## 2017-04-12 DIAGNOSIS — R55 Syncope and collapse: Secondary | ICD-10-CM | POA: Diagnosis present

## 2017-04-12 DIAGNOSIS — I498 Other specified cardiac arrhythmias: Secondary | ICD-10-CM | POA: Diagnosis not present

## 2017-04-12 DIAGNOSIS — I2510 Atherosclerotic heart disease of native coronary artery without angina pectoris: Secondary | ICD-10-CM | POA: Diagnosis not present

## 2017-04-12 DIAGNOSIS — E7800 Pure hypercholesterolemia, unspecified: Secondary | ICD-10-CM | POA: Insufficient documentation

## 2017-04-12 DIAGNOSIS — R42 Dizziness and giddiness: Secondary | ICD-10-CM | POA: Insufficient documentation

## 2017-04-12 DIAGNOSIS — R531 Weakness: Secondary | ICD-10-CM | POA: Insufficient documentation

## 2017-04-12 DIAGNOSIS — Z79899 Other long term (current) drug therapy: Secondary | ICD-10-CM | POA: Diagnosis not present

## 2017-04-12 DIAGNOSIS — F419 Anxiety disorder, unspecified: Secondary | ICD-10-CM | POA: Insufficient documentation

## 2017-04-12 DIAGNOSIS — R110 Nausea: Secondary | ICD-10-CM | POA: Insufficient documentation

## 2017-04-12 DIAGNOSIS — E785 Hyperlipidemia, unspecified: Secondary | ICD-10-CM | POA: Diagnosis not present

## 2017-04-12 DIAGNOSIS — R200 Anesthesia of skin: Secondary | ICD-10-CM | POA: Diagnosis not present

## 2017-04-12 DIAGNOSIS — Z881 Allergy status to other antibiotic agents status: Secondary | ICD-10-CM | POA: Diagnosis not present

## 2017-04-12 DIAGNOSIS — Z882 Allergy status to sulfonamides status: Secondary | ICD-10-CM | POA: Diagnosis not present

## 2017-04-12 DIAGNOSIS — R002 Palpitations: Secondary | ICD-10-CM | POA: Diagnosis not present

## 2017-04-12 DIAGNOSIS — Z7982 Long term (current) use of aspirin: Secondary | ICD-10-CM | POA: Diagnosis not present

## 2017-04-12 LAB — CBC
HEMATOCRIT: 38.1 % (ref 35.0–47.0)
HEMOGLOBIN: 12.9 g/dL (ref 12.0–16.0)
MCH: 30.4 pg (ref 26.0–34.0)
MCHC: 33.9 g/dL (ref 32.0–36.0)
MCV: 89.5 fL (ref 80.0–100.0)
Platelets: 325 10*3/uL (ref 150–440)
RBC: 4.26 MIL/uL (ref 3.80–5.20)
RDW: 14.6 % — ABNORMAL HIGH (ref 11.5–14.5)
WBC: 12.1 10*3/uL — ABNORMAL HIGH (ref 3.6–11.0)

## 2017-04-12 LAB — BASIC METABOLIC PANEL
Anion gap: 9 (ref 5–15)
BUN: 17 mg/dL (ref 6–20)
CHLORIDE: 104 mmol/L (ref 101–111)
CO2: 25 mmol/L (ref 22–32)
Calcium: 9.2 mg/dL (ref 8.9–10.3)
Creatinine, Ser: 0.79 mg/dL (ref 0.44–1.00)
GFR calc non Af Amer: >60 mL/min (ref >60)
GLUCOSE: 119 mg/dL — AB (ref 65–99)
POTASSIUM: 3.7 mmol/L (ref 3.5–5.1)
Sodium: 138 mmol/L (ref 135–145)

## 2017-04-12 LAB — URINALYSIS, COMPLETE (UACMP) WITH MICROSCOPIC
Bacteria, UA: NONE SEEN
Bilirubin Urine: NEGATIVE
Glucose, UA: NEGATIVE mg/dL
HGB URINE DIPSTICK: NEGATIVE
KETONES UR: NEGATIVE mg/dL
LEUKOCYTES UA: NEGATIVE
Nitrite: NEGATIVE
Protein, ur: NEGATIVE mg/dL
Specific Gravity, Urine: 1.005 (ref 1.005–1.030)
pH: 7 (ref 5.0–8.0)

## 2017-04-12 LAB — TROPONIN I
Troponin I: <0.03 ng/mL (ref <0.03)
Troponin I: <0.03 ng/mL (ref <0.03)
Troponin I: <0.03 ng/mL (ref <0.03)

## 2017-04-12 MED ORDER — ACETAMINOPHEN 325 MG PO TABS
650.0000 mg | ORAL_TABLET | Freq: Four times a day (QID) | ORAL | Status: DC | PRN
  Administered 2017-04-13: 650 mg via ORAL
  Filled 2017-04-12: qty 2

## 2017-04-12 MED ORDER — FAMOTIDINE 20 MG PO TABS
20.0000 mg | ORAL_TABLET | Freq: Two times a day (BID) | ORAL | Status: DC
  Administered 2017-04-12 – 2017-04-13 (×3): 20 mg via ORAL
  Filled 2017-04-12 (×3): qty 1

## 2017-04-12 MED ORDER — MESALAMINE 400 MG PO CPDR
1200.0000 mg | DELAYED_RELEASE_CAPSULE | Freq: Two times a day (BID) | ORAL | Status: DC
  Administered 2017-04-12 – 2017-04-13 (×3): 1200 mg via ORAL
  Filled 2017-04-12 (×3): qty 3

## 2017-04-12 MED ORDER — ROSUVASTATIN CALCIUM 10 MG PO TABS
5.0000 mg | ORAL_TABLET | Freq: Every day | ORAL | Status: DC
  Administered 2017-04-12: 5 mg via ORAL
  Filled 2017-04-12 (×2): qty 1

## 2017-04-12 MED ORDER — VERAPAMIL HCL ER 120 MG PO TBCR
120.0000 mg | EXTENDED_RELEASE_TABLET | Freq: Every day | ORAL | Status: DC
  Administered 2017-04-12: 120 mg via ORAL
  Filled 2017-04-12 (×2): qty 1

## 2017-04-12 MED ORDER — ENOXAPARIN SODIUM 40 MG/0.4ML ~~LOC~~ SOLN: 40.0000 mg | SUBCUTANEOUS | Status: DC

## 2017-04-12 MED ORDER — ESCITALOPRAM OXALATE 10 MG PO TABS
20.0000 mg | ORAL_TABLET | Freq: Every day | ORAL | Status: DC
  Administered 2017-04-12: 20 mg via ORAL
  Filled 2017-04-12 (×2): qty 2

## 2017-04-12 MED ORDER — ACETAMINOPHEN 650 MG RE SUPP: 650.0000 mg | Freq: Four times a day (QID) | RECTAL | Status: DC | PRN

## 2017-04-12 MED ORDER — ONDANSETRON HCL 4 MG/2ML IJ SOLN: 4.0000 mg | Freq: Four times a day (QID) | INTRAMUSCULAR | Status: DC | PRN

## 2017-04-12 MED ORDER — CARVEDILOL 6.25 MG PO TABS
6.2500 mg | ORAL_TABLET | Freq: Two times a day (BID) | ORAL | Status: DC
  Administered 2017-04-12 – 2017-04-13 (×3): 6.25 mg via ORAL
  Filled 2017-04-12 (×3): qty 1

## 2017-04-12 MED ORDER — CLOPIDOGREL BISULFATE 75 MG PO TABS
75.0000 mg | ORAL_TABLET | Freq: Every day | ORAL | Status: DC
  Administered 2017-04-12 – 2017-04-13 (×2): 75 mg via ORAL
  Filled 2017-04-12 (×3): qty 1

## 2017-04-12 MED ORDER — ONDANSETRON HCL 4 MG PO TABS: 4.0000 mg | ORAL_TABLET | Freq: Four times a day (QID) | ORAL | Status: DC | PRN

## 2017-04-12 MED ORDER — ASPIRIN EC 81 MG PO TBEC
81.0000 mg | DELAYED_RELEASE_TABLET | Freq: Every day | ORAL | Status: DC
Start: 2017-04-12 — End: 2017-04-13
  Administered 2017-04-13: 81 mg via ORAL
  Filled 2017-04-12 (×2): qty 1

## 2017-04-12 NOTE — H&P (Signed)
[facility] [place] NAME: Morgan Moore    MR#:  [id number]  DATE OF BIRTH:  11-06-1956  DATE OF ADMISSION:  04/12/2017  PRIMARY CARE PHYSICIAN: Einar Pheasant, MD   REQUESTING/REFERRING PHYSICIAN: Dr. Larae Grooms  CHIEF COMPLAINT:   Chief Complaint  Patient presents with  . Chest Pain    HISTORY OF PRESENT ILLNESS:  Morgan Moore  is a 61 y.o. female with a known history of Hypertension, hyperlipidemia, history of nephrolithiasis, history of coronary artery disease status post stent placement, anxiety who presents to the hospital due to weakness, dizziness and atypical chest pain. Patient has a previous history of coronary artery disease with stent placement and even on a previous heart attacks patient had very atypical symptoms of weakness, dizziness and palpitations but no acute chest pain. She presents to the ER as her symptoms are very similar to when she had a previous heart attack last year and year and half ago.  She denies any chest pain but admits to some weakness, lightheadedness and palpitations. She was concerned and therefore came to the ER for further evaluation. In the Emergency room patient's first set of cardiac markers are negative, her EKG shows sinus rhythm with no acute ST or T-wave changes. Patient admits to some nausea, but denies any vomiting, abdominal pain, fever or chills cough or any other associated symptoms presently.  PAST MEDICAL HISTORY:   Past Medical History:  Diagnosis Date  . Allergy   . Chicken pox   . Coronary artery disease   . Heart murmur   . Hyperlipidemia   . Hypertension   . Migraines    hormonal, puberty  . Nephrolithiasis   . Phlebitis     PAST SURGICAL HISTORY:   Past Surgical History:  Procedure Laterality Date  . BREAST BIOPSY    . heart murmur    . TONSILLECTOMY AND ADENOIDECTOMY  1962  . tubaligation  1990    SOCIAL HISTORY:   Social History  Substance Use Topics  .  Smoking status: Never Smoker  . Smokeless tobacco: Never Used  . Alcohol use No    FAMILY HISTORY:   Family History  Problem Relation Age of Onset  . Heart disease Father   . Heart disease Brother   . Cancer Maternal Aunt   . Stroke Mother     DRUG ALLERGIES:   Allergies  Allergen Reactions  . Ciprofloxacin Swelling  . Keflex [Cephalexin]     "anaphylaxis"   . Lisinopril     "anaphylaxis"  . Meclizine     "swelling"  . Sulfur     "anaphylaxis"  . Tessalon [Benzonatate]     "anaphylaxis"    REVIEW OF SYSTEMS:   Review of Systems  Constitutional: Negative for fever and weight loss.  HENT: Negative for congestion, nosebleeds and tinnitus.   Eyes: Negative for blurred vision, double vision and redness.  Respiratory: Negative for cough, hemoptysis and shortness of breath.   Cardiovascular: Positive for chest pain and palpitations. Negative for orthopnea, leg swelling and PND.  Gastrointestinal: Negative for abdominal pain, diarrhea, melena, nausea and vomiting.  Genitourinary: Negative for dysuria, hematuria and urgency.  Musculoskeletal: Negative for falls and joint pain.  Neurological: Positive for weakness. Negative for dizziness, tingling, sensory change, focal weakness, seizures and headaches.  Endo/Heme/Allergies: Negative for polydipsia. Does not bruise/bleed easily.  Psychiatric/Behavioral: Negative for depression and memory loss. The patient is not nervous/anxious.     MEDICATIONS AT HOME:  Prior to Admission medications   Medication Sig Start Date End Date Taking? Authorizing Provider  aspirin EC 81 MG tablet Take 0.81 mg by mouth daily.    [provider]  carvedilol (COREG) 6.25 MG tablet Take 6.25 mg by mouth 2 (two) times daily. 3.1 IN THE AM AND 3.1 AT NIGHT 08/04/16 08/04/17  [provider]  clopidogrel (PLAVIX) 75 MG tablet Take 75 mg by mouth daily.    [provider]  EPINEPHrine 0.3 mg/0.3 mL IJ SOAJ injection 0.3 mg as  needed. 12/18/13   [provider]  escitalopram (LEXAPRO) 20 MG tablet TAKE 1 TABLET BY MOUTH ONCE DAILY. 03/29/17   Einar Pheasant, MD  fexofenadine (ALLEGRA) 180 MG tablet Take 180 mg by mouth daily.    [provider]  fosfomycin (MONUROL) 3 g PACK Take 3 g by mouth once.    [provider]  Mesalamine (DELZICOL) 400 MG CPDR DR capsule Take 6 capsules (2,400 mg total) by mouth daily. 01/07/16   Einar Pheasant, MD  metroNIDAZOLE (METROGEL) 1 % gel Apply to affected area on face daily. 01/16/17   Einar Pheasant, MD  omeprazole (PRILOSEC) 40 MG capsule Take 40 mg by mouth daily. Reported on 04/13/2016 04/29/15 04/28/16  [provider]  ondansetron (ZOFRAN-ODT) 4 MG disintegrating tablet TAKE 1 TABLET EVERY 8 HOURS AS NEEDED FOR NAUSEA & VOMITING 02/10/17   Einar Pheasant, MD  rizatriptan (MAXALT) 5 MG tablet USE AS DIRECTED 04/28/16   Einar Pheasant, MD  rosuvastatin (CRESTOR) 5 MG tablet TAKE 1 TABLET BY MOUTH ONCE DAILY. 02/04/17   Einar Pheasant, MD  tobramycin-dexamethasone [facility]) ophthalmic solution Place 1 drop into the right eye every 6 (six) hours. 07/02/16   Einar Pheasant, MD      VITAL SIGNS:  Blood pressure (!) 150/64, pulse (!) 55, temperature 98.3 F (36.8 C), temperature source Oral, resp. rate 18, height 5' 3"  (1.6 m), weight 72.6 kg (160 lb), SpO2 96 %.  PHYSICAL EXAMINATION:  Physical Exam  GENERAL:  62 y.o.-year-old patient lying in the bed with no acute distress.  EYES: Pupils equal, round, reactive to light and accommodation. No scleral icterus. Extraocular muscles intact.  HEENT: Head atraumatic, normocephalic. Oropharynx and nasopharynx clear. No oropharyngeal erythema, moist oral mucosa  NECK:  Supple, no jugular venous distention. No thyroid enlargement, no tenderness.  LUNGS: Normal breath sounds bilaterally, no wheezing, rales, rhonchi. No use of accessory muscles of respiration.  CARDIOVASCULAR: S1, S2 RRR. No murmurs, rubs,  gallops, clicks.  ABDOMEN: Soft, nontender, nondistended. Bowel sounds present. No organomegaly or mass.  EXTREMITIES: No pedal edema, cyanosis, or clubbing. + 2 pedal & radial pulses b/l.   NEUROLOGIC: Cranial nerves II through XII are intact. No focal Motor or sensory deficits appreciated b/l PSYCHIATRIC: The patient is alert and oriented x 3.  SKIN: No obvious rash, lesion, or ulcer.   LABORATORY PANEL:   CBC  Recent Labs Lab 04/12/17 0740  WBC 12.1*  HGB 12.9  HCT 38.1  PLT 325   ------------------------------------------------------------------------------------------------------------------  Chemistries   Recent Labs Lab 04/12/17 0740  NA 138  K 3.7  CL 104  CO2 25  GLUCOSE 119*  BUN 17  CREATININE 0.79  CALCIUM 9.2   ------------------------------------------------------------------------------------------------------------------  Cardiac Enzymes  Recent Labs Lab 04/12/17 0740  TROPONINI <0.03   ------------------------------------------------------------------------------------------------------------------  RADIOLOGY:  Dg Chest 2 View  Result Date: 04/12/2017 CLINICAL DATA:  Chest pain EXAM: CHEST  2 VIEW COMPARISON:  09/22/2016 chest radiograph. FINDINGS: Left coronary stent  noted. Stable cardiomediastinal silhouette with normal heart size. No pneumothorax. No pleural effusion. Lungs appear clear, with no acute consolidative airspace disease and no pulmonary edema. IMPRESSION: No active cardiopulmonary disease. Electronically Signed   By: Ilona Sorrel M.D.   On: 04/12/2017 08:12     IMPRESSION AND PLAN:   61 year old female with past medical history of coronary artery disease status post stent placement, hypertension, hyperlipidemia, anxiety, who presents to the hospital due to weakness, dizziness, palpitations and atypical chest pain.  1. Atypical chest pain/weakness/dizziness-patient has a previous history of coronary artery disease and also a  strong family history of coronary artery disease. Patient on her previous heart attack also had very atypical symptoms as mentioned above. -We'll observe on telemetry, cycle her cardiac markers. Get a cardiology consult. -Continue aspirin, carvedilol, Plavix, Crestor.  2. Essential hypertension-continue carvedilol.  3. Hyperlipidemia-continue Crestor.  4. GERD-continue Zantac.  5. Depression-continue Lexapro.   All the records are reviewed and case discussed with ED provider. Management plans discussed with the patient, family and they are in agreement.  CODE STATUS: Full code  TOTAL TIME TAKING CARE OF THIS PATIENT: 45 minutes.    Henreitta Leber M.D on 04/12/2017 at 9:16 AM  Between 7am to 6pm - Pager - [phone]  After 6pm go to [email or web address] - password [email or web address] [place] [facility]  Office  [phone]  CC: Primary care physician; Einar Pheasant, MD

## 2017-04-12 NOTE — ED Notes (Signed)
Patient denies pain and is resting comfortably.  

## 2017-04-12 NOTE — ED Notes (Signed)
Patient to Xray via stretcher.  AAOx3.  Skin warm and dry.  NAD

## 2017-04-12 NOTE — ED Notes (Signed)
Called in to pt room with c/o right iv site pain. Ems iv and had not been accessed. Right arm swollen, painful. iv removed, cath intact. Arm elevated on pillows.

## 2017-04-12 NOTE — ED Triage Notes (Addendum)
Awoke this morning with c/o felling dizzy and some "chest discomfort".  Patient describes mid chest numbness this moring, denies current complaint.  States that she did have some nausea with symptoms.  Also describes feeling as "feeling an irregular heart beat".  Patient states symptoms are similar to previous cardiac symptoms.

## 2017-04-12 NOTE — ED Provider Notes (Signed)
[facility] Emergency Department Provider Note  ____________________________________________   First MD Initiated Contact with Patient 04/12/17 [phone]     (approximate)  I have reviewed the triage vital signs and the nursing notes.   HISTORY  Chief Complaint Chest Pain   HPI Morgan Moore is a 61 y.o. female with a history of coronary artery disease status post 2 stents about one year ago who is presenting to the emergency department today after a near syncopal episode. She says that she woke up this morning with a numbness feeling all over her body. She says that this lasted several minutes but after that she began feeling weak all over and with the sensation of an irregular heartbeat. She said that she was also feeling like she was going to pass out. She said that she was similar sensation last year which did have a syncopal episode and required 2 stents. She is denying any chest pain at this time. Denies any shortness of breath. Said that she was having nausea as well during the episode at home which lasted total of several minutes.  Per EMS, the patient did not have any orthostatic changes. On a rhythm strip they did record a tracing where the patient appeared to have no P waves but the patient's rate was maintained at approximately 50. The patient says that she generally has a slightly bradycardic heart rate in the 50s. She does take a beta blocker.  Patient is denying any chest pain. Says that she has not had pain in the past when she has a cardiac issues.   Past Medical History:  Diagnosis Date  . Allergy   . Chicken pox   . Coronary artery disease   . Heart murmur   . Hyperlipidemia   . Hypertension   . Migraines    hormonal, puberty  . Nephrolithiasis   . Phlebitis     Patient Active Problem List   Diagnosis Date Noted  . Right arm pain 01/16/2017  . History of frequent urinary tract infections 08/09/2016  . Weakness 07/02/2016  .  CAD (coronary artery disease) 04/17/2016  . Light headedness 04/17/2016  . Left hip pain 04/13/2016  . Health care maintenance 01/19/2016  . Essential hypertension 11/07/2015  . Hypercholesterolemia 11/07/2015  . Migraine headache 11/07/2015  . Nephrolithiasis 11/07/2015  . GERD (gastroesophageal reflux disease) 11/07/2015  . Mitral regurgitation 11/07/2015  . Stress 11/07/2015    Past Surgical History:  Procedure Laterality Date  . BREAST BIOPSY    . heart murmur    . [place]    Prior to Admission medications   Medication Sig Start Date End Date Taking? Authorizing Provider  aspirin EC 81 MG tablet Take 0.81 mg by mouth daily.    [provider]  carvedilol (COREG) 6.25 MG tablet Take 6.25 mg by mouth 2 (two) times daily. 3.1 IN THE AM AND 3.1 AT NIGHT 08/04/16 08/04/17  [provider]  clopidogrel (PLAVIX) 75 MG tablet Take 75 mg by mouth daily.    [provider]  EPINEPHrine 0.3 mg/0.3 mL IJ SOAJ injection 0.3 mg as needed. 12/18/13   [provider]  escitalopram (LEXAPRO) 20 MG tablet TAKE 1 TABLET BY MOUTH ONCE DAILY. 03/29/17   Einar Pheasant, MD  fexofenadine (ALLEGRA) 180 MG tablet Take 180 mg by mouth daily.    [provider]  fosfomycin (MONUROL) 3 g PACK Take 3 g by mouth once.  [provider]  Mesalamine (DELZICOL) 400 MG CPDR DR capsule Take 6 capsules (2,400 mg total) by mouth daily. 01/07/16   Einar Pheasant, MD  metroNIDAZOLE (METROGEL) 1 % gel Apply to affected area on face daily. 01/16/17   Einar Pheasant, MD  omeprazole (PRILOSEC) 40 MG capsule Take 40 mg by mouth daily. Reported on 04/13/2016 04/29/15 04/28/16  [provider]  ondansetron (ZOFRAN-ODT) 4 MG disintegrating tablet TAKE 1 TABLET EVERY 8 HOURS AS NEEDED FOR NAUSEA & VOMITING 02/10/17   Einar Pheasant, MD  rizatriptan (MAXALT) 5 MG tablet USE AS DIRECTED 04/28/16   Einar Pheasant, MD    rosuvastatin (CRESTOR) 5 MG tablet TAKE 1 TABLET BY MOUTH ONCE DAILY. 02/04/17   Einar Pheasant, MD  tobramycin-dexamethasone [facility]) ophthalmic solution Place 1 drop into the right eye every 6 (six) hours. 07/02/16   Einar Pheasant, MD  verapamil (CALAN) 40 MG tablet Take by mouth. 08/27/16 08/27/17  [provider]    Allergies Ciprofloxacin; Keflex [cephalexin]; Lisinopril; Meclizine; Sulfur; and Tessalon [benzonatate]  Family History  Problem Relation Age of Onset  . Heart disease Father   . Heart disease Brother   . Cancer Maternal Aunt     Social History Social History  Substance Use Topics  . Smoking status: Never Smoker  . Smokeless tobacco: Never Used  . Alcohol use No    Review of Systems  Constitutional: No fever/chills Eyes: No visual changes. ENT: No sore throat. Cardiovascular: as above Respiratory: Denies shortness of breath. Gastrointestinal: No abdominal pain.  no vomiting.  No diarrhea.  No constipation. Genitourinary: Negative for dysuria. Musculoskeletal: Negative for back pain. Skin: Negative for rash. Neurological: Negative for headaches, focal weakness or numbness.   ____________________________________________   PHYSICAL EXAM:  VITAL SIGNS: ED Triage Vitals  Enc Vitals Group     BP 04/12/17 0742 (!) 144/66     Pulse Rate 04/12/17 0742 60     Resp 04/12/17 0742 16     Temp 04/12/17 0742 98.3 F (36.8 C)     Temp Source 04/12/17 0742 Oral     SpO2 04/12/17 0742 98 %     Weight 04/12/17 0739 160 lb (72.6 kg)     Height 04/12/17 0739 5' 3"  (1.6 m)     Head Circumference --      Peak Flow --      Pain Score --      Pain Loc --      Pain Edu? --      Excl. in [place]? --     Constitutional: Alert and oriented. Well appearing and in no acute distress. Eyes: Conjunctivae are normal.  Head: Atraumatic. Nose: No congestion/rhinnorhea. Mouth/Throat: Mucous membranes are moist.  Neck: No stridor.   Cardiovascular: Normal rate,  regular rhythm. Grossly normal heart sounds.  Good peripheral circulation With equal and bilateral radial as well as dorsalis pedis pulses. Respiratory: Normal respiratory effort.  No retractions. Lungs CTAB. Gastrointestinal: Soft and nontender. No distention.  Musculoskeletal: No lower extremity tenderness nor edema.  No joint effusions. Neurologic:  Normal speech and language. No gross focal neurologic deficits are appreciated. Skin:  Skin is warm, dry and intact. No rash noted. Psychiatric: Mood and affect are normal. Speech and behavior are normal.  ____________________________________________   LABS (all labs ordered are listed, but only abnormal results are displayed)  Labs Reviewed  BASIC METABOLIC PANEL - Abnormal; Notable for the following:       Result Value   Glucose, Bld 119 (*)  All other components within normal limits  CBC - Abnormal; Notable for the following:    WBC 12.1 (*)    RDW 14.6 (*)    All other components within normal limits  TROPONIN I  URINALYSIS, COMPLETE (UACMP) WITH MICROSCOPIC   ____________________________________________  EKG  ED ECG REPORT I, Brek Reece,  Youlanda Roys, the attending physician, personally viewed and interpreted this ECG.   Date: 04/12/2017  EKG Time: 0741  Rate: 56  Rhythm: normal sinus rhythm  Axis: Normal  Intervals:none  ST&T Change: No ST segment elevation or depression. No abnormal T-wave inversion. Q waves in 3 and aVF were present on the EKG seen from 09/22/2016. ____________________________________________  RADIOLOGY  No active cardiopulmonary disease on the chest x-ray. ____________________________________________   PROCEDURES  Procedure(s) performed:   Procedures  Critical Care performed:   ____________________________________________   INITIAL IMPRESSION / ASSESSMENT AND PLAN / ED COURSE  Pertinent labs & imaging results that were available during my care of the patient were reviewed by me and  considered in my medical decision making (see chart for details).  ----------------------------------------- 8:46 AM on 04/12/2017 -----------------------------------------  Patient resting without any distress at this time. I discussed the case Dr. Clayborn Bigness who says that depending on how the patient feels that he is comfortable with her being either admitted or discharged for outpatient follow-up. I discussed this with the patient and she says that she does feel comfortable going home, especially because these are very similar symptoms to one year ago when she required her to stents. She'll be admitted to the hospital. Discussed case Dr. Verdell Carmine of the hospitalist service and he will be admitting the patient. Patient is understanding of this plan was to comply.      ____________________________________________   FINAL CLINICAL IMPRESSION(S) / ED DIAGNOSES  Near syncope. Chest pain. Junctional rhythm.    NEW MEDICATIONS STARTED DURING THIS VISIT:  New Prescriptions   No medications on file     Note:  This document was prepared using Dragon voice recognition software and may include unintentional dictation errors.     Orbie Pyo, MD 04/12/17 [phone]

## 2017-04-13 LAB — TROPONIN I: Troponin I: <0.03 ng/mL (ref <0.03)

## 2017-04-13 NOTE — Care Management Note (Addendum)
Case Management Note  Patient Details  Name: Maxyne Derocher MRN: [id number] Date of Birth: 1956-09-08  Subjective/Objective:                 Placed in observation for chest pain in patient with known CAD. negative troponins. Cardiology consult pending. Readmit risk is low.  No discharge needs identified at present by care manager or members of care team   Action/Plan:   Expected Discharge Date:                  Expected Discharge Plan:     In-House Referral:     Discharge planning Services     Post Acute Care Choice:    Choice offered to:     DME Arranged:    DME Agency:     HH Arranged:    HH Agency:     Status of Service:     If discussed at [employer], dates discussed:    Additional Comments:  Katrina Stack, RN 04/13/2017, 9:00 AM

## 2017-04-13 NOTE — Discharge Summary (Signed)
[facility] [place] NAME: Morgan Moore    MR#:  [id number]  DATE OF BIRTH:  January 27, 1956  DATE OF ADMISSION:  04/12/2017 ADMITTING PHYSICIAN: Henreitta Leber, MD  DATE OF DISCHARGE: 04/13/2017  PRIMARY CARE PHYSICIAN: Einar Pheasant, MD    ADMISSION DIAGNOSIS:  Junctional rhythm [I49.8] Near syncope [R55] Chest pain, unspecified type [R07.9]  DISCHARGE DIAGNOSIS:  Active Problems:   Chest pain   SECONDARY DIAGNOSIS:   Past Medical History:  Diagnosis Date  . Allergy   . Chicken pox   . Coronary artery disease   . Heart murmur   . Hyperlipidemia   . Hypertension   . Migraines    hormonal, puberty  . Nephrolithiasis   . Phlebitis     HOSPITAL COURSE:   61 year old female with past medical history of coronary artery disease status post stent placement, hypertension, hyperlipidemia, anxiety, who presents to the hospital due to weakness, dizziness, palpitations and atypical chest pain.  1. Atypical chest pain/weakness/dizziness-patient has a previous history of coronary artery disease and also a strong family history of coronary artery disease. Patient on her previous heart attack also had very atypical symptoms as mentioned above. -She was observed on telemetry, had 3 sets of cardiac markers checked which were negative. A cardiology consult was obtained who did not recommend any further intervention. Patient's had a recent nuclear medicine stress test last year which was negative. -She'll be discharged on aspirin and Plavix Crestor and carvedilol and follow-up with cardiology next 2 weeks. She is clinically asymptomatic and feels better today.  2. Essential hypertension-she will continue carvedilol, Verapamil.  3. Hyperlipidemia- she will continue Crestor.  4. GERD-she will continue Zantac.  5. Depression- she will continue Lexapro.  DISCHARGE CONDITIONS:   Stable.   CONSULTS OBTAINED:  Treatment Team:  Yolonda Kida, MD  DRUG ALLERGIES:   Allergies  Allergen Reactions  . Ciprofloxacin Swelling  . Keflex [Cephalexin]     "anaphylaxis"   . Lisinopril     "anaphylaxis"  . Meclizine     "swelling"  . Sulfur     "anaphylaxis"  . Tessalon [Benzonatate]     "anaphylaxis"  . Monurol [Fosfomycin Tromethamine] Rash    rash    DISCHARGE MEDICATIONS:   Allergies as of 04/13/2017      Reactions   Ciprofloxacin Swelling   Keflex [cephalexin]    "anaphylaxis"   Lisinopril    "anaphylaxis"   Meclizine    "swelling"   Sulfur    "anaphylaxis"   Tessalon [benzonatate]    "anaphylaxis"   Monurol [fosfomycin Tromethamine] Rash   rash      Medication List    STOP taking these medications   metroNIDAZOLE 1 % gel Commonly known as:  METROGEL     TAKE these medications   aspirin EC 81 MG tablet Take 0.81 mg by mouth daily.   carvedilol 3.125 MG tablet Commonly known as:  COREG Take 6.25 mg by mouth 2 (two) times daily. 3.1 IN THE AM AND 3.1 AT NIGHT   clopidogrel 75 MG tablet Commonly known as:  PLAVIX Take 75 mg by mouth daily.   EPINEPHrine 0.3 mg/0.3 mL Soaj injection Commonly known as:  EPI-PEN 0.3 mg as needed.   escitalopram 20 MG tablet Commonly known as:  LEXAPRO TAKE 1 TABLET BY MOUTH ONCE DAILY.   Mesalamine 400 MG Cpdr DR capsule Commonly known as:  DELZICOL Take 6 capsules (2,400 mg total) by mouth daily.  What changed:  how much to take  when to take this   ondansetron 4 MG disintegrating tablet Commonly known as:  ZOFRAN-ODT TAKE 1 TABLET EVERY 8 HOURS AS NEEDED FOR NAUSEA & VOMITING   ranitidine 150 MG capsule Commonly known as:  ZANTAC Take 150 mg by mouth.   rizatriptan 5 MG tablet Commonly known as:  MAXALT USE AS DIRECTED   rosuvastatin 5 MG tablet Commonly known as:  CRESTOR TAKE 1 TABLET BY MOUTH ONCE DAILY.   tobramycin-dexamethasone ophthalmic solution Commonly known as:  TOBRADEX Place 1 drop into the right eye every 6 (six)  hours.   verapamil 120 MG CR tablet Commonly known as:  CALAN-SR Take 120 mg by mouth at bedtime.         DISCHARGE INSTRUCTIONS:   DIET:  Cardiac diet  DISCHARGE CONDITION:  Stable  ACTIVITY:  Activity as tolerated  OXYGEN:  Home Oxygen: No.   Oxygen Delivery: room air  DISCHARGE LOCATION:  home   If you experience worsening of your admission symptoms, develop shortness of breath, life threatening emergency, suicidal or homicidal thoughts you must seek medical attention immediately by calling 911 or calling your MD immediately  if symptoms less severe.  You Must read complete instructions/literature along with all the possible adverse reactions/side effects for all the Medicines you take and that have been prescribed to you. Take any new Medicines after you have completely understood and accpet all the possible adverse reactions/side effects.   Please note  You were cared for by a hospitalist during your hospital stay. If you have any questions about your discharge medications or the care you received while you were in the hospital after you are discharged, you can call the unit and asked to speak with the hospitalist on call if the hospitalist that took care of you is not available. Once you are discharged, your primary care physician will handle any further medical issues. Please note that NO REFILLS for any discharge medications will be authorized once you are discharged, as it is imperative that you return to your primary care physician (or establish a relationship with a primary care physician if you do not have one) for your aftercare needs so that they can reassess your need for medications and monitor your lab values.     Today   No further palpitations, weakness or dizziness. No chest pain. Feels better.  VITAL SIGNS:  Blood pressure (!) 120/56, pulse 60, temperature 98.7 F (37.1 C), temperature source Oral, resp. rate 18, height 5' 3"  (1.6 m), weight 74.1 kg  (163 lb 6.4 oz), SpO2 97 %.  I/O:   Intake/Output Summary (Last 24 hours) at 04/13/17 1317 Last data filed at 04/13/17 1144  Gross per 24 hour  Intake              840 ml  Output              800 ml  Net               40 ml    PHYSICAL EXAMINATION:  GENERAL:  61 y.o.-year-old patient lying in the bed with no acute distress.  EYES: Pupils equal, round, reactive to light and accommodation. No scleral icterus. Extraocular muscles intact.  HEENT: Head atraumatic, normocephalic. Oropharynx and nasopharynx clear.  NECK:  Supple, no jugular venous distention. No thyroid enlargement, no tenderness.  LUNGS: Normal breath sounds bilaterally, no wheezing, rales,rhonchi. No use of accessory muscles of respiration.  CARDIOVASCULAR: S1, S2  normal. No murmurs, rubs, or gallops.  ABDOMEN: Soft, non-tender, non-distended. Bowel sounds present. No organomegaly or mass.  EXTREMITIES: No pedal edema, cyanosis, or clubbing.  NEUROLOGIC: Cranial nerves II through XII are intact. No focal motor or sensory defecits b/l.  PSYCHIATRIC: The patient is alert and oriented x 3.   SKIN: No obvious rash, lesion, or ulcer.   DATA REVIEW:   CBC  Recent Labs Lab 04/12/17 0740  WBC 12.1*  HGB 12.9  HCT 38.1  PLT 325    Chemistries   Recent Labs Lab 04/12/17 0740  NA 138  K 3.7  CL 104  CO2 25  GLUCOSE 119*  BUN 17  CREATININE 0.79  CALCIUM 9.2    Cardiac Enzymes  Recent Labs Lab 04/13/17 0102  TROPONINI <0.03    RADIOLOGY:  Dg Chest 2 View  Result Date: 04/12/2017 CLINICAL DATA:  Chest pain EXAM: CHEST  2 VIEW COMPARISON:  09/22/2016 chest radiograph. FINDINGS: Left coronary stent noted. Stable cardiomediastinal silhouette with normal heart size. No pneumothorax. No pleural effusion. Lungs appear clear, with no acute consolidative airspace disease and no pulmonary edema. IMPRESSION: No active cardiopulmonary disease. Electronically Signed   By: Ilona Sorrel M.D.   On: 04/12/2017 08:12       Management plans discussed with the patient, family and they are in agreement.  CODE STATUS:     Code Status Orders        Start     Ordered   04/12/17 1229  Full code  Continuous     04/12/17 1228    Code Status History    Date Active Date Inactive Code Status Order ID Comments User Context   This patient has a current code status but no historical code status.      TOTAL TIME TAKING CARE OF THIS PATIENT: 40 minutes.    Henreitta Leber M.D on 04/13/2017 at 1:17 PM  Between 7am to 6pm - Pager - [phone]  After 6pm go to [email or web address] - Proofreader  [facility]  Office  [phone]  CC: Primary care physician; Einar Pheasant, MD

## 2017-04-13 NOTE — Progress Notes (Signed)
Patient discharge home as per order, discharge instruction provided, iv removed

## 2017-04-13 NOTE — Progress Notes (Signed)
Rounded with MD at bedside, patient updated about POC, patient is being discharge home to day.

## 2017-04-13 NOTE — Progress Notes (Signed)
Patient presently resting in the bed, alert and oriented, denies any pain, vss, mood calm, pleasant, patient independent ambulate frequently

## 2017-04-16 ENCOUNTER — Other Ambulatory Visit (INDEPENDENT_AMBULATORY_CARE_PROVIDER_SITE_OTHER): Payer: BC Managed Care – PPO

## 2017-04-16 DIAGNOSIS — E7800 Pure hypercholesterolemia, unspecified: Secondary | ICD-10-CM

## 2017-04-16 DIAGNOSIS — I10 Essential (primary) hypertension: Secondary | ICD-10-CM

## 2017-04-16 LAB — HEPATIC FUNCTION PANEL
ALK PHOS: 81 U/L (ref 39–117)
ALT: 18 U/L (ref 0–35)
AST: 18 U/L (ref 0–37)
Albumin: 4.2 g/dL (ref 3.5–5.2)
BILIRUBIN DIRECT: 0 mg/dL (ref 0.0–0.3)
Total Bilirubin: 0.5 mg/dL (ref 0.2–1.2)
Total Protein: 7.1 g/dL (ref 6.0–8.3)

## 2017-04-16 LAB — BASIC METABOLIC PANEL
BUN: 17 mg/dL (ref 6–23)
CALCIUM: 9.9 mg/dL (ref 8.4–10.5)
CO2: 28 mEq/L (ref 19–32)
CREATININE: 0.93 mg/dL (ref 0.40–1.20)
Chloride: 104 mEq/L (ref 96–112)
GFR: 64.89 mL/min (ref >60.00)
Glucose, Bld: 95 mg/dL (ref 70–99)
Potassium: 4.1 mEq/L (ref 3.5–5.1)
Sodium: 139 mEq/L (ref 135–145)

## 2017-04-16 LAB — LIPID PANEL
CHOL/HDL RATIO: 4
Cholesterol: 181 mg/dL (ref 0–200)
HDL: 49.6 mg/dL (ref >39.00)
LDL CALC: 104 mg/dL — AB (ref 0–99)
NONHDL: 131.2
Triglycerides: 135 mg/dL (ref 0.0–149.0)
VLDL: 27 mg/dL (ref 0.0–40.0)

## 2017-04-20 ENCOUNTER — Encounter: Payer: Self-pay | Admitting: Internal Medicine

## 2017-04-20 ENCOUNTER — Ambulatory Visit (INDEPENDENT_AMBULATORY_CARE_PROVIDER_SITE_OTHER): Payer: BC Managed Care – PPO | Admitting: Internal Medicine

## 2017-04-20 VITALS — BP 130/74 | HR 69 | Temp 98.6°F | Resp 12 | Ht 63.0 in | Wt 164.2 lb

## 2017-04-20 DIAGNOSIS — I2510 Atherosclerotic heart disease of native coronary artery without angina pectoris: Secondary | ICD-10-CM

## 2017-04-20 DIAGNOSIS — K219 Gastro-esophageal reflux disease without esophagitis: Secondary | ICD-10-CM

## 2017-04-20 DIAGNOSIS — F439 Reaction to severe stress, unspecified: Secondary | ICD-10-CM | POA: Diagnosis not present

## 2017-04-20 DIAGNOSIS — Z1239 Encounter for other screening for malignant neoplasm of breast: Secondary | ICD-10-CM

## 2017-04-20 DIAGNOSIS — E7800 Pure hypercholesterolemia, unspecified: Secondary | ICD-10-CM | POA: Diagnosis not present

## 2017-04-20 DIAGNOSIS — I10 Essential (primary) hypertension: Secondary | ICD-10-CM | POA: Diagnosis not present

## 2017-04-20 DIAGNOSIS — Z1231 Encounter for screening mammogram for malignant neoplasm of breast: Secondary | ICD-10-CM

## 2017-04-20 DIAGNOSIS — L989 Disorder of the skin and subcutaneous tissue, unspecified: Secondary | ICD-10-CM

## 2017-04-20 DIAGNOSIS — N200 Calculus of kidney: Secondary | ICD-10-CM | POA: Diagnosis not present

## 2017-04-20 NOTE — Progress Notes (Signed)
Pre-visit discussion using our clinic review tool. No additional management support is needed unless otherwise documented below in the visit note.  

## 2017-04-20 NOTE — Progress Notes (Signed)
Patient ID: Morgan Moore, female   DOB: [birth year], 61 y.o.   MRN: [id number]   Subjective:    Patient ID: Morgan Moore, female    DOB: Jan 19, 1956, 61 y.o.   MRN: [id number]  HPI  Patient here for a scheduled physical.  She has several issues,so it was decided to postpone physical.  She reports she has a persistent lesion on her scalp.  Discussed dermatology referral.  She had some neck pain.  Has been evaluated.  Diagnosed with cervicalgia with bilateral radiculopathy.  Hold on massage.  Overall relatively stable.  Planning to have gum graft 05/04/17.  She plans to discuss with Dr Marcelline Deist for clearance. No chest pain.  Breathing stable.  No acid reflux.  No abdominal pain.  Bowels moving.  Recently passed kidney stone.  Doing well now.  Trying to stay active.     Past Medical History:  Diagnosis Date  . Allergy   . Chicken pox   . Coronary artery disease   . Heart murmur   . Hyperlipidemia   . Hypertension   . Migraines    hormonal, puberty  . Nephrolithiasis   . Phlebitis    Past Surgical History:  Procedure Laterality Date  . BREAST BIOPSY    . heart murmur    . TONSILLECTOMY AND ADENOIDECTOMY  1962  . tubaligation  1990   Family History  Problem Relation Age of Onset  . Heart disease Father   . Heart disease Brother   . Cancer Maternal Aunt   . Stroke Mother    Social History   Social History  . Marital status: Divorced    Spouse name: N/A  . Number of children: N/A  . Years of education: N/A   Social History Main Topics  . Smoking status: Never Smoker  . Smokeless tobacco: Never Used  . Alcohol use No  . Drug use: No  . Sexual activity: Not Asked   Other Topics Concern  . None   Social History Narrative  . None    Outpatient Encounter Prescriptions as of 04/20/2017  Medication Sig  . aspirin EC 81 MG tablet Take 0.81 mg by mouth daily.  . carvedilol (COREG) 3.125 MG tablet Take 6.25 mg by mouth 2 (two) times daily. 3.1 IN THE AM AND 3.1  AT NIGHT  . clopidogrel (PLAVIX) 75 MG tablet Take 75 mg by mouth daily.  Marland Kitchen EPINEPHrine 0.3 mg/0.3 mL IJ SOAJ injection 0.3 mg as needed.  Marland Kitchen escitalopram (LEXAPRO) 20 MG tablet TAKE 1 TABLET BY MOUTH ONCE DAILY.  Marland Kitchen Mesalamine (DELZICOL) 400 MG CPDR DR capsule Take 6 capsules (2,400 mg total) by mouth daily. (Patient taking differently: Take 1,200 mg by mouth 2 (two) times daily. )  . ondansetron (ZOFRAN-ODT) 4 MG disintegrating tablet TAKE 1 TABLET EVERY 8 HOURS AS NEEDED FOR NAUSEA & VOMITING  . ranitidine (ZANTAC) 150 MG capsule Take 150 mg by mouth.  . rizatriptan (MAXALT) 5 MG tablet USE AS DIRECTED  . rosuvastatin (CRESTOR) 5 MG tablet TAKE 1 TABLET BY MOUTH ONCE DAILY.  Marland Kitchen tobramycin-dexamethasone (TOBRADEX) ophthalmic solution Place 1 drop into the right eye every 6 (six) hours.  . verapamil (CALAN-SR) 120 MG CR tablet Take 120 mg by mouth at bedtime.   No facility-administered encounter medications on file as of 04/20/2017.     Review of Systems  Constitutional: Negative for appetite change and unexpected weight change.  HENT: Negative for congestion and sinus pressure.   Respiratory: Negative for cough and  chest tightness.        Breathing overall stable.   Cardiovascular: Negative for chest pain, palpitations and leg swelling.  Gastrointestinal: Negative for abdominal pain, diarrhea, nausea and vomiting.  Genitourinary: Negative for difficulty urinating and dysuria.  Musculoskeletal: Negative for back pain and joint swelling.  Skin: Negative for color change and rash.  Neurological: Negative for dizziness and headaches.  Psychiatric/Behavioral: Negative for agitation and dysphoric mood.       Objective:     Blood pressure rechecked by me:  128/74  Physical Exam  Constitutional: She appears well-developed and well-nourished. No distress.  HENT:  Nose: Nose normal.  Mouth/Throat: Oropharynx is clear and moist.  Neck: Neck supple. No thyromegaly present.  Cardiovascular:  Normal rate and regular rhythm.   Pulmonary/Chest: Breath sounds normal. No respiratory distress. She has no wheezes.  Abdominal: Soft. Bowel sounds are normal. There is no tenderness.  Musculoskeletal: She exhibits no edema or tenderness.  Lymphadenopathy:    She has no cervical adenopathy.  Skin: No rash noted. No erythema.  Psychiatric: She has a normal mood and affect. Her behavior is normal.    BP 130/74 (BP Location: Left Arm, Patient Position: Sitting, Cuff Size: Normal)   Pulse 69   Temp 98.6 F (37 C) (Oral)   Resp 12   Ht 5' 3"  (1.6 m)   Wt 164 lb 3.2 oz (74.5 kg)   SpO2 98%   BMI 29.09 kg/m  Wt Readings from Last 3 Encounters:  04/20/17 164 lb 3.2 oz (74.5 kg)  04/12/17 163 lb 6.4 oz (74.1 kg)  03/28/17 160 lb (72.6 kg)     Lab Results  Component Value Date   WBC 12.1 (H) 04/12/2017   HGB 12.9 04/12/2017   HCT 38.1 04/12/2017   PLT 325 04/12/2017   GLUCOSE 95 04/16/2017   CHOL 181 04/16/2017   TRIG 135.0 04/16/2017   HDL 49.60 04/16/2017   LDLCALC 104 (H) 04/16/2017   ALT 18 04/16/2017   AST 18 04/16/2017   NA 139 04/16/2017   K 4.1 04/16/2017   CL 104 04/16/2017   CREATININE 0.93 04/16/2017   BUN 17 04/16/2017   CO2 28 04/16/2017   TSH 2.18 07/02/2016   INR 1.1 09/21/2014    Dg Chest 2 View  Result Date: 04/12/2017 CLINICAL DATA:  Chest pain EXAM: CHEST  2 VIEW COMPARISON:  09/22/2016 chest radiograph. FINDINGS: Left coronary stent noted. Stable cardiomediastinal silhouette with normal heart size. No pneumothorax. No pleural effusion. Lungs appear clear, with no acute consolidative airspace disease and no pulmonary edema. IMPRESSION: No active cardiopulmonary disease. Electronically Signed   By: Ilona Sorrel M.D.   On: 04/12/2017 08:12       Assessment & Plan:   Problem List Items Addressed This Visit    CAD (coronary artery disease)    Followed by cardiology.  Stable.  Follow.  Continue risk factor modification.        Essential hypertension      Blood pressure under good control.  Continue same medication regimen.  Follow pressures.  Follow metabolic panel.        GERD (gastroesophageal reflux disease)    Controlled on prilosec.        Hypercholesterolemia    On crestor.  Low cholesterol diet and exercise.  Tolerating this dose.  Unable to increase the dose.  Follow lipid panel and liver function tests.       Nephrolithiasis    Recently passed stone.  Discussed f/u  with urology.        Stress    Doing better.  Follow.         Other Visit Diagnoses    Screening for breast cancer    -  Primary   Relevant Orders   MM Digital Screening   Scalp lesion       persistent lesion.  refer to dermatology.     Relevant Orders   Ambulatory referral to Dermatology       Einar Pheasant, MD

## 2017-04-22 ENCOUNTER — Encounter: Payer: Self-pay | Admitting: Internal Medicine

## 2017-04-22 NOTE — Assessment & Plan Note (Signed)
Controlled on prilosec.

## 2017-04-22 NOTE — Assessment & Plan Note (Signed)
Recently passed stone.  Discussed f/u with urology.

## 2017-04-22 NOTE — Assessment & Plan Note (Signed)
Followed by cardiology.  Stable.  Follow.  Continue risk factor modification.

## 2017-04-22 NOTE — Assessment & Plan Note (Signed)
Blood pressure under good control.  Continue same medication regimen.  Follow pressures.  Follow metabolic panel.   

## 2017-04-22 NOTE — Assessment & Plan Note (Signed)
Doing better.  Follow.

## 2017-04-22 NOTE — Assessment & Plan Note (Signed)
On crestor.  Low cholesterol diet and exercise.  Tolerating this dose.  Unable to increase the dose.  Follow lipid panel and liver function tests.

## 2017-04-27 ENCOUNTER — Other Ambulatory Visit: Payer: Self-pay | Admitting: Internal Medicine

## 2017-05-04 ENCOUNTER — Ambulatory Visit: Payer: BC Managed Care – PPO

## 2017-05-18 ENCOUNTER — Ambulatory Visit
Admission: RE | Admit: 2017-05-18 | Discharge: 2017-05-18 | Disposition: A | Discharge status: Home / Self Care | Payer: BC Managed Care – PPO | Source: Ambulatory Visit | Attending: Internal Medicine | Admitting: Internal Medicine

## 2017-05-18 DIAGNOSIS — Z1239 Encounter for other screening for malignant neoplasm of breast: Secondary | ICD-10-CM

## 2017-05-18 DIAGNOSIS — Z1231 Encounter for screening mammogram for malignant neoplasm of breast: Secondary | ICD-10-CM | POA: Insufficient documentation

## 2017-05-25 ENCOUNTER — Other Ambulatory Visit: Payer: Self-pay | Admitting: Internal Medicine

## 2017-06-17 ENCOUNTER — Encounter: Payer: Self-pay | Admitting: Internal Medicine

## 2017-06-17 ENCOUNTER — Ambulatory Visit (INDEPENDENT_AMBULATORY_CARE_PROVIDER_SITE_OTHER): Payer: BC Managed Care – PPO | Admitting: Internal Medicine

## 2017-06-17 VITALS — BP 126/68 | HR 70 | Temp 98.2°F | Resp 15 | Ht 63.0 in | Wt 164.6 lb

## 2017-06-17 DIAGNOSIS — E7800 Pure hypercholesterolemia, unspecified: Secondary | ICD-10-CM

## 2017-06-17 DIAGNOSIS — I10 Essential (primary) hypertension: Secondary | ICD-10-CM | POA: Diagnosis not present

## 2017-06-17 DIAGNOSIS — F439 Reaction to severe stress, unspecified: Secondary | ICD-10-CM

## 2017-06-17 DIAGNOSIS — Z0000 Encounter for general adult medical examination without abnormal findings: Secondary | ICD-10-CM | POA: Diagnosis not present

## 2017-06-17 DIAGNOSIS — I340 Nonrheumatic mitral (valve) insufficiency: Secondary | ICD-10-CM

## 2017-06-17 DIAGNOSIS — K219 Gastro-esophageal reflux disease without esophagitis: Secondary | ICD-10-CM

## 2017-06-17 DIAGNOSIS — G43009 Migraine without aura, not intractable, without status migrainosus: Secondary | ICD-10-CM

## 2017-06-17 DIAGNOSIS — I2510 Atherosclerotic heart disease of native coronary artery without angina pectoris: Secondary | ICD-10-CM

## 2017-06-17 NOTE — Progress Notes (Signed)
Patient ID: Morgan Moore, female   DOB: Sep 30, 1956, 61 y.o.   MRN: [id number]   Subjective:    Patient ID: Morgan Moore, female    DOB: 1956/02/28, 61 y.o.   MRN: [id number]  HPI  Patient here for her physical exam.  She reports she is doing well.  Feels better.  No chest pain.  Breathing stable.  No headache or light headedness.  Has noticed some decreased pulse - when being checked.  Mostly running in the 50s.  No acid reflux.  No abdominal pain.  Bowels moving.  Saw GI 05/17/17.  On lialda.  Stable.  Massage - helped back and neck.  Overall feels better.     Past Medical History:  Diagnosis Date  . Allergy   . Chicken pox   . Coronary artery disease   . Heart murmur   . Hyperlipidemia   . Hypertension   . Migraines    hormonal, puberty  . Nephrolithiasis   . Phlebitis    Past Surgical History:  Procedure Laterality Date  . BREAST CYST ASPIRATION     unsure of side  . heart murmur    . TONSILLECTOMY AND ADENOIDECTOMY  1962  . tubaligation  1990   Family History  Problem Relation Age of Onset  . Heart disease Father   . Heart disease Brother   . Cancer Maternal Aunt   . Breast cancer Maternal Aunt   . Stroke Mother    Social History   Social History  . Marital status: Divorced    Spouse name: N/A  . Number of children: N/A  . Years of education: N/A   Social History Main Topics  . Smoking status: Never Smoker  . Smokeless tobacco: Never Used  . Alcohol use No  . Drug use: No  . Sexual activity: Not Asked   Other Topics Concern  . None   Social History Narrative  . None    Outpatient Encounter Prescriptions as of 06/17/2017  Medication Sig  . aspirin EC 81 MG tablet Take 0.81 mg by mouth daily.  . carvedilol (COREG) 3.125 MG tablet Take 6.25 mg by mouth 2 (two) times daily. 3.1 IN THE AM AND 3.1 AT NIGHT  . clindamycin (CLEOCIN T) 1 % lotion   . clopidogrel (PLAVIX) 75 MG tablet Take 75 mg by mouth daily.  Marland Kitchen EPINEPHrine 0.3 mg/0.3 mL IJ  SOAJ injection 0.3 mg as needed.  Marland Kitchen escitalopram (LEXAPRO) 20 MG tablet TAKE 1 TABLET BY MOUTH ONCE DAILY.  Marland Kitchen Mesalamine (DELZICOL) 400 MG CPDR DR capsule Take 6 capsules (2,400 mg total) by mouth daily. (Patient taking differently: Take 1,200 mg by mouth 2 (two) times daily. )  . ranitidine (ZANTAC) 150 MG capsule Take 150 mg by mouth.  . rizatriptan (MAXALT) 5 MG tablet USE AS DIRECTED  . rosuvastatin (CRESTOR) 5 MG tablet TAKE 1 TABLET BY MOUTH ONCE DAILY.  Marland Kitchen tobramycin-dexamethasone (TOBRADEX) ophthalmic solution Place 1 drop into the right eye every 6 (six) hours.  . verapamil (CALAN-SR) 120 MG CR tablet Take 120 mg by mouth at bedtime.  . [DISCONTINUED] ondansetron (ZOFRAN-ODT) 4 MG disintegrating tablet TAKE 1 TABLET EVERY 8 HOURS AS NEEDED FOR NAUSEA & VOMITING (Patient not taking: Reported on 06/17/2017)   No facility-administered encounter medications on file as of 06/17/2017.     Review of Systems  Constitutional: Negative for appetite change and unexpected weight change.  HENT: Negative for congestion and sinus pressure.   Eyes: Negative for pain and  visual disturbance.  Respiratory: Negative for cough, chest tightness and shortness of breath.   Cardiovascular: Negative for chest pain, palpitations and leg swelling.  Gastrointestinal: Negative for abdominal pain, diarrhea, nausea and vomiting.  Genitourinary: Negative for difficulty urinating and dysuria.  Musculoskeletal: Negative for joint swelling and myalgias.  Skin: Negative for color change and rash.  Neurological: Negative for dizziness, light-headedness and headaches.  Hematological: Negative for adenopathy. Does not bruise/bleed easily.  Psychiatric/Behavioral: Negative for agitation and dysphoric mood.       Objective:    Physical Exam  Constitutional: She is oriented to person, place, and time. She appears well-developed and well-nourished. No distress.  HENT:  Nose: Nose normal.  Mouth/Throat: Oropharynx is  clear and moist.  Eyes: Right eye exhibits no discharge. Left eye exhibits no discharge. No scleral icterus.  Neck: Neck supple. No thyromegaly present.  Cardiovascular: Normal rate and regular rhythm.   Pulmonary/Chest: Breath sounds normal. No accessory muscle usage. No tachypnea. No respiratory distress. She has no decreased breath sounds. She has no wheezes. She has no rhonchi. Right breast exhibits no inverted nipple, no mass, no nipple discharge and no tenderness (no axillary adenopathy). Left breast exhibits no inverted nipple, no mass, no nipple discharge and no tenderness (no axilarry adenopathy).  Abdominal: Soft. Bowel sounds are normal. There is no tenderness.  Musculoskeletal: She exhibits no edema or tenderness.  Lymphadenopathy:    She has no cervical adenopathy.  Neurological: She is alert and oriented to person, place, and time.  Skin: Skin is warm. No rash noted. No erythema.  Psychiatric: She has a normal mood and affect. Her behavior is normal.    BP 126/68 (BP Location: Left Arm, Patient Position: Sitting, Cuff Size: Normal)   Pulse 70   Temp 98.2 F (36.8 C) (Oral)   Resp 15   Ht 5' 3"  (1.6 m)   Wt 164 lb 9.6 oz (74.7 kg)   SpO2 97%   BMI 29.16 kg/m  Wt Readings from Last 3 Encounters:  06/17/17 164 lb 9.6 oz (74.7 kg)  04/20/17 164 lb 3.2 oz (74.5 kg)  04/12/17 163 lb 6.4 oz (74.1 kg)     Lab Results  Component Value Date   WBC 12.1 (H) 04/12/2017   HGB 12.9 04/12/2017   HCT 38.1 04/12/2017   PLT 325 04/12/2017   GLUCOSE 95 04/16/2017   CHOL 181 04/16/2017   TRIG 135.0 04/16/2017   HDL 49.60 04/16/2017   LDLCALC 104 (H) 04/16/2017   ALT 18 04/16/2017   AST 18 04/16/2017   NA 139 04/16/2017   K 4.1 04/16/2017   CL 104 04/16/2017   CREATININE 0.93 04/16/2017   BUN 17 04/16/2017   CO2 28 04/16/2017   TSH 2.18 07/02/2016   INR 1.1 09/21/2014    Mm Digital Screening  Result Date: 05/18/2017 CLINICAL DATA:  Screening. EXAM: DIGITAL SCREENING  BILATERAL MAMMOGRAM WITH CAD COMPARISON:  Previous exam(s). ACR Breast Density Category c: The breast tissue is heterogeneously dense, which may obscure small masses. FINDINGS: There are no findings suspicious for malignancy. Images were processed with CAD. IMPRESSION: No mammographic evidence of malignancy. A result letter of this screening mammogram will be mailed directly to the patient. RECOMMENDATION: Screening mammogram in one year. (Code:SM-B-01Y) BI-RADS CATEGORY  1: Negative. Electronically Signed   By: Claudie Revering M.D.   On: 05/18/2017 17:49       Assessment & Plan:   Problem List Items Addressed This Visit    CAD (coronary artery  disease)    Followed by cardiology.  Continue risk factor modification.  Stable.        Essential hypertension    Blood pressure under good control.  Continue same medication regimen.  Follow pressures.  Follow metabolic panel.        Relevant Orders   CBC with Differential/Platelet   TSH   Basic metabolic panel   GERD (gastroesophageal reflux disease)    Controlled on prilosec.        Health care maintenance    Physical today 06/16/17.  PAP 01/24/16.  Mammogram 05/18/17 - birads I.  Colonoscopy 04/2013.        Hypercholesterolemia    On crestor.  Low cholesterol diet and exercise.  Follow lipid panel and liver function tests.        Relevant Orders   Lipid panel   Hepatic function panel   Migraine headache    Stable.  Has seen neurology.       Mitral regurgitation    Followed by Dr Clayborn Bigness.        Stress    Doing better.  Feels better.  Follow.        Other Visit Diagnoses    Routine general medical examination at a health care facility    -  Primary       Einar Pheasant, MD

## 2017-06-17 NOTE — Assessment & Plan Note (Signed)
Physical today 06/16/17.  PAP 01/24/16.  Mammogram 05/18/17 - birads I.  Colonoscopy 04/2013.

## 2017-06-20 ENCOUNTER — Encounter: Payer: Self-pay | Admitting: Internal Medicine

## 2017-06-20 NOTE — Assessment & Plan Note (Signed)
Controlled on prilosec.

## 2017-06-20 NOTE — Assessment & Plan Note (Signed)
Followed by Dr Clayborn Bigness.

## 2017-06-20 NOTE — Assessment & Plan Note (Signed)
Stable.  Has seen neurology.

## 2017-06-20 NOTE — Assessment & Plan Note (Signed)
Doing better.  Feels better.  Follow.

## 2017-06-20 NOTE — Assessment & Plan Note (Signed)
Followed by cardiology.  Continue risk factor modification.  Stable.

## 2017-06-20 NOTE — Assessment & Plan Note (Signed)
On crestor.  Low cholesterol diet and exercise.  Follow lipid panel and liver function tests.

## 2017-06-20 NOTE — Assessment & Plan Note (Signed)
Blood pressure under good control.  Continue same medication regimen.  Follow pressures.  Follow metabolic panel.   

## 2017-06-23 NOTE — Telephone Encounter (Signed)
This was corrected by billing

## 2017-06-28 ENCOUNTER — Other Ambulatory Visit: Payer: Self-pay | Admitting: Internal Medicine

## 2017-07-26 ENCOUNTER — Telehealth: Payer: Self-pay | Admitting: Internal Medicine

## 2017-07-26 ENCOUNTER — Other Ambulatory Visit: Payer: Self-pay | Admitting: Internal Medicine

## 2017-07-26 NOTE — Telephone Encounter (Signed)
With disability, they usually request records from [place], but have specific physicians that determine disability.  We will forward info if receive request.

## 2017-07-26 NOTE — Telephone Encounter (Signed)
Called patient back. Was told by social security that she needed to try to get disability. She wanted to let you know that the paper work work will be sent to you and Cardiology. She was not sure if she needed to make appointment for you for follow up regarding that or what she needs to do.

## 2017-07-26 NOTE — Telephone Encounter (Signed)
Pt has some question regarding possible disability. Please advise

## 2017-07-27 NOTE — Telephone Encounter (Signed)
Patient informed will call if any questions.

## 2017-08-12 ENCOUNTER — Other Ambulatory Visit (INDEPENDENT_AMBULATORY_CARE_PROVIDER_SITE_OTHER): Payer: BC Managed Care – PPO

## 2017-08-12 DIAGNOSIS — E7800 Pure hypercholesterolemia, unspecified: Secondary | ICD-10-CM | POA: Diagnosis not present

## 2017-08-12 DIAGNOSIS — I10 Essential (primary) hypertension: Secondary | ICD-10-CM

## 2017-08-12 LAB — LIPID PANEL
CHOL/HDL RATIO: 4
Cholesterol: 175 mg/dL (ref 0–200)
HDL: 44.8 mg/dL (ref >39.00)
LDL Cholesterol: 107 mg/dL — ABNORMAL HIGH (ref 0–99)
NONHDL: 129.74
Triglycerides: 116 mg/dL (ref 0.0–149.0)
VLDL: 23.2 mg/dL (ref 0.0–40.0)

## 2017-08-12 LAB — CBC WITH DIFFERENTIAL/PLATELET
BASOS ABS: 0 10*3/uL (ref 0.0–0.1)
Basophils Relative: 0.7 % (ref 0.0–3.0)
EOS PCT: 5.2 % — AB (ref 0.0–5.0)
Eosinophils Absolute: 0.3 10*3/uL (ref 0.0–0.7)
HCT: 36.9 % (ref 36.0–46.0)
Hemoglobin: 12.1 g/dL (ref 12.0–15.0)
LYMPHS ABS: 1.5 10*3/uL (ref 0.7–4.0)
Lymphocytes Relative: 22.5 % (ref 12.0–46.0)
MCHC: 32.9 g/dL (ref 30.0–36.0)
MCV: 90.4 fl (ref 78.0–100.0)
MONOS PCT: 11 % (ref 3.0–12.0)
Monocytes Absolute: 0.7 10*3/uL (ref 0.1–1.0)
NEUTROS ABS: 4.1 10*3/uL (ref 1.4–7.7)
NEUTROS PCT: 60.6 % (ref 43.0–77.0)
PLATELETS: 340 10*3/uL (ref 150.0–400.0)
RBC: 4.09 Mil/uL (ref 3.87–5.11)
RDW: 14.5 % (ref 11.5–15.5)
WBC: 6.7 10*3/uL (ref 4.0–10.5)

## 2017-08-12 LAB — BASIC METABOLIC PANEL
BUN: 17 mg/dL (ref 6–23)
CHLORIDE: 104 meq/L (ref 96–112)
CO2: 28 meq/L (ref 19–32)
CREATININE: 0.91 mg/dL (ref 0.40–1.20)
Calcium: 9.5 mg/dL (ref 8.4–10.5)
GFR: 66.47 mL/min (ref >60.00)
Glucose, Bld: 96 mg/dL (ref 70–99)
Potassium: 4.2 mEq/L (ref 3.5–5.1)
Sodium: 138 mEq/L (ref 135–145)

## 2017-08-12 LAB — HEPATIC FUNCTION PANEL
ALK PHOS: 77 U/L (ref 39–117)
ALT: 13 U/L (ref 0–35)
AST: 14 U/L (ref 0–37)
Albumin: 4.1 g/dL (ref 3.5–5.2)
BILIRUBIN DIRECT: 0.1 mg/dL (ref 0.0–0.3)
BILIRUBIN TOTAL: 0.6 mg/dL (ref 0.2–1.2)
Total Protein: 7 g/dL (ref 6.0–8.3)

## 2017-08-12 LAB — TSH: TSH: 4.03 u[IU]/mL (ref 0.35–4.50)

## 2017-08-13 ENCOUNTER — Emergency Department
Admission: EM | Admit: 2017-08-13 | Discharge: 2017-08-13 | Disposition: A | Discharge status: Home / Self Care | Payer: BC Managed Care – PPO | Attending: Emergency Medicine | Admitting: Emergency Medicine

## 2017-08-13 ENCOUNTER — Encounter: Payer: Self-pay | Admitting: Emergency Medicine

## 2017-08-13 ENCOUNTER — Emergency Department: Payer: BC Managed Care – PPO

## 2017-08-13 DIAGNOSIS — I10 Essential (primary) hypertension: Secondary | ICD-10-CM | POA: Diagnosis not present

## 2017-08-13 DIAGNOSIS — Z7982 Long term (current) use of aspirin: Secondary | ICD-10-CM | POA: Diagnosis not present

## 2017-08-13 DIAGNOSIS — I2510 Atherosclerotic heart disease of native coronary artery without angina pectoris: Secondary | ICD-10-CM | POA: Diagnosis not present

## 2017-08-13 DIAGNOSIS — Z79899 Other long term (current) drug therapy: Secondary | ICD-10-CM | POA: Insufficient documentation

## 2017-08-13 DIAGNOSIS — N200 Calculus of kidney: Secondary | ICD-10-CM | POA: Diagnosis not present

## 2017-08-13 DIAGNOSIS — R109 Unspecified abdominal pain: Secondary | ICD-10-CM | POA: Diagnosis present

## 2017-08-13 LAB — CBC WITH DIFFERENTIAL/PLATELET
BASOS ABS: 0 10*3/uL (ref 0–0.1)
Basophils Relative: 1 %
Eosinophils Absolute: 0 10*3/uL (ref 0–0.7)
Eosinophils Relative: 0 %
HEMATOCRIT: 35.9 % (ref 35.0–47.0)
Hemoglobin: 12.2 g/dL (ref 12.0–16.0)
LYMPHS ABS: 0.7 10*3/uL — AB (ref 1.0–3.6)
LYMPHS PCT: 8 %
MCH: 30.4 pg (ref 26.0–34.0)
MCHC: 33.8 g/dL (ref 32.0–36.0)
MCV: 89.7 fL (ref 80.0–100.0)
MONO ABS: 0.5 10*3/uL (ref 0.2–0.9)
Monocytes Relative: 5 %
Neutro Abs: 8.3 10*3/uL — ABNORMAL HIGH (ref 1.4–6.5)
Neutrophils Relative %: 86 %
Platelets: 312 10*3/uL (ref 150–440)
RBC: 4 MIL/uL (ref 3.80–5.20)
RDW: 14.4 % (ref 11.5–14.5)
WBC: 9.6 10*3/uL (ref 3.6–11.0)

## 2017-08-13 LAB — URINALYSIS, COMPLETE (UACMP) WITH MICROSCOPIC
Bilirubin Urine: NEGATIVE
GLUCOSE, UA: NEGATIVE mg/dL
Ketones, ur: 5 mg/dL — AB
NITRITE: NEGATIVE
PH: 6 (ref 5.0–8.0)
Protein, ur: NEGATIVE mg/dL
SPECIFIC GRAVITY, URINE: 1.015 (ref 1.005–1.030)

## 2017-08-13 LAB — BASIC METABOLIC PANEL
ANION GAP: 9 (ref 5–15)
BUN: 16 mg/dL (ref 6–20)
CHLORIDE: 105 mmol/L (ref 101–111)
CO2: 25 mmol/L (ref 22–32)
Calcium: 8.9 mg/dL (ref 8.9–10.3)
Creatinine, Ser: 0.89 mg/dL (ref 0.44–1.00)
GFR calc Af Amer: >60 mL/min (ref >60)
GLUCOSE: 134 mg/dL — AB (ref 65–99)
POTASSIUM: 3.7 mmol/L (ref 3.5–5.1)
Sodium: 139 mmol/L (ref 135–145)

## 2017-08-13 MED ORDER — ONDANSETRON HCL 4 MG/2ML IJ SOLN
4.0000 mg | Freq: Once | INTRAMUSCULAR | Status: AC
  Administered 2017-08-13: 4 mg via INTRAVENOUS
  Filled 2017-08-13: qty 2

## 2017-08-13 MED ORDER — ONDANSETRON HCL 4 MG PO TABS: 4.0000 mg | ORAL_TABLET | Freq: Three times a day (TID) | ORAL | 0 refills | Status: DC | PRN

## 2017-08-13 MED ORDER — NITROFURANTOIN MONOHYD MACRO 100 MG PO CAPS: 100.0000 mg | ORAL_CAPSULE | Freq: Two times a day (BID) | ORAL | 0 refills | Status: DC

## 2017-08-13 MED ORDER — TRAMADOL HCL 50 MG PO TABS: 50.0000 mg | ORAL_TABLET | Freq: Four times a day (QID) | ORAL | 0 refills | Status: DC | PRN

## 2017-08-13 MED ORDER — TAMSULOSIN HCL 0.4 MG PO CAPS
0.4000 mg | ORAL_CAPSULE | Freq: Every day | ORAL | Status: DC
  Administered 2017-08-13: 0.4 mg via ORAL
  Filled 2017-08-13: qty 1

## 2017-08-13 MED ORDER — MORPHINE SULFATE (PF) 4 MG/ML IV SOLN
4.0000 mg | Freq: Once | INTRAVENOUS | Status: AC
  Administered 2017-08-13: 4 mg via INTRAVENOUS
  Filled 2017-08-13: qty 1

## 2017-08-13 MED ORDER — IBUPROFEN 800 MG PO TABS
800.0000 mg | ORAL_TABLET | Freq: Once | ORAL | Status: AC
  Administered 2017-08-13: 800 mg via ORAL
  Filled 2017-08-13: qty 1

## 2017-08-13 MED ORDER — NITROFURANTOIN MONOHYD MACRO 100 MG PO CAPS
100.0000 mg | ORAL_CAPSULE | Freq: Once | ORAL | Status: AC
  Administered 2017-08-13: 100 mg via ORAL
  Filled 2017-08-13: qty 1

## 2017-08-13 MED ORDER — SODIUM CHLORIDE 0.9 % IV BOLUS (SEPSIS)
1000.0000 mL | Freq: Once | INTRAVENOUS | Status: AC
  Administered 2017-08-13: 1000 mL via INTRAVENOUS

## 2017-08-13 MED ORDER — TAMSULOSIN HCL 0.4 MG PO CAPS: 0.4000 mg | ORAL_CAPSULE | Freq: Every day | ORAL | 0 refills | Status: DC

## 2017-08-13 NOTE — Discharge Instructions (Signed)
you're being treated for kidney stone. You may take over-the-counter ibuprofen 600 mg every 8 hours as needed for pain.  Make sure you to drink plenty of fluids.  You're being prescribed pain medication tramadol as needed for more severe pain. You're being prescribed Zofran for any nausea. You're being prescribed Flomax to help the urine flow.  You're being prescribed Macrobid antibiotic for possible urinary infection.  Return to the emergency room immediately for any worsening condition including new or uncontrolled pain, inability to urinate, fever, or any other symptoms concerning to you.

## 2017-08-13 NOTE — ED Provider Notes (Signed)
[facility] Emergency Department Provider Note ____________________________________________   I have reviewed the triage vital signs and the triage nursing note.  HISTORY  Chief Complaint Flank Pain    Historian Patient  HPI Morgan Moore is a 61 y.o. female With a history of prior multiple kidney stones, presents today with fullness or soreness in the right flank for a few days, and then a severe pain that she noticed around 3 this morning, which has eased off somewhat now but is still moderate to severe in intensity. This feels similar to prior kidney stones. Denies urinary symptoms per se. No fever.  Nothing makes it worse or better.    Past Medical History:  Diagnosis Date  . Allergy   . Chicken pox   . Coronary artery disease   . Heart murmur   . Hyperlipidemia   . Hypertension   . Migraines    hormonal, puberty  . Nephrolithiasis   . Phlebitis     Patient Active Problem List   Diagnosis Date Noted  . Chest pain 04/12/2017  . Right arm pain 01/16/2017  . History of frequent urinary tract infections 08/09/2016  . Weakness 07/02/2016  . CAD (coronary artery disease) 04/17/2016  . Light headedness 04/17/2016  . Left hip pain 04/13/2016  . Health care maintenance 01/19/2016  . Essential hypertension 11/07/2015  . Hypercholesterolemia 11/07/2015  . Migraine headache 11/07/2015  . Nephrolithiasis 11/07/2015  . GERD (gastroesophageal reflux disease) 11/07/2015  . Mitral regurgitation 11/07/2015  . Stress 11/07/2015    Past Surgical History:  Procedure Laterality Date  . BREAST CYST ASPIRATION     unsure of side  . heart murmur    . [place]    Prior to Admission medications   Medication Sig Start Date End Date Taking? Authorizing Provider  aspirin EC 81 MG tablet Take 0.81 mg by mouth daily.   Yes [provider]  carvedilol (COREG) 3.125 MG tablet Take 6.25 mg by  mouth 2 (two) times daily. 3.1 IN THE AM AND 3.1 AT NIGHT 08/04/16 08/13/17 Yes [provider]  clopidogrel (PLAVIX) 75 MG tablet Take 75 mg by mouth daily.   Yes [provider]  escitalopram (LEXAPRO) 20 MG tablet TAKE 1 TABLET BY MOUTH ONCE DAILY. 07/26/17  Yes Einar Pheasant, MD  Mesalamine (DELZICOL) 400 MG CPDR DR capsule Take 6 capsules (2,400 mg total) by mouth daily. Patient taking differently: Take 400 mg by mouth 2 (two) times daily.  01/07/16  Yes Einar Pheasant, MD  nitroGLYCERIN (NITROSTAT) 0.4 MG SL tablet Place 0.4 mg under the tongue every 5 (five) minutes as needed for chest pain.   Yes [provider]  ranitidine (ZANTAC) 150 MG capsule Take 150 mg by mouth.   Yes [provider]  rosuvastatin (CRESTOR) 5 MG tablet TAKE 1 TABLET BY MOUTH ONCE DAILY. 06/28/17  Yes Einar Pheasant, MD  verapamil (CALAN-SR) 120 MG CR tablet Take 120 mg by mouth at bedtime.   Yes [provider]  clindamycin (CLEOCIN T) 1 % lotion  06/10/17   [provider]  EPINEPHrine 0.3 mg/0.3 mL IJ SOAJ injection 0.3 mg as needed. 12/18/13   [provider]  nitrofurantoin, macrocrystal-monohydrate, (MACROBID) 100 MG capsule Take 1 capsule (100 mg total) by mouth 2 (two) times daily. 08/13/17   Lisa Roca, MD  ondansetron (ZOFRAN) 4 MG tablet Take 1 tablet (4 mg total) by mouth every 8 (eight) hours as  needed for nausea or vomiting. 08/13/17   Lisa Roca, MD  rizatriptan (MAXALT) 5 MG tablet USE AS DIRECTED 04/28/16   Einar Pheasant, MD  tamsulosin (FLOMAX) 0.4 MG CAPS capsule Take 1 capsule (0.4 mg total) by mouth daily. 08/13/17   Lisa Roca, MD  tobramycin-dexamethasone [facility]) ophthalmic solution Place 1 drop into the right eye every 6 (six) hours. 07/02/16   Einar Pheasant, MD  traMADol (ULTRAM) 50 MG tablet Take 1 tablet (50 mg total) by mouth every 6 (six) hours as needed. 08/13/17   Lisa Roca, MD    Allergies  Allergen Reactions  .  Ciprofloxacin Swelling  . Keflex [Cephalexin]     "anaphylaxis"   . Lisinopril     "anaphylaxis"  . Meclizine     "swelling"  . Sulfur     "anaphylaxis"  . Tessalon [Benzonatate]     "anaphylaxis"  . Monurol [Fosfomycin Tromethamine] Rash    rash    Family History  Problem Relation Age of Onset  . Heart disease Father   . Heart disease Brother   . Cancer Maternal Aunt   . Breast cancer Maternal Aunt   . Stroke Mother     Social History Social History  Substance Use Topics  . Smoking status: Never Smoker  . Smokeless tobacco: Never Used  . Alcohol use No    Review of Systems  Constitutional: Negative for fever. Eyes: Negative for visual changes. ENT: Negative for sore throat. Cardiovascular: Negative for chest pain. Respiratory: Negative for shortness of breath. Gastrointestinal: Negative for vomiting and diarrhea. Genitourinary: Negative for dysuria. Musculoskeletal: Negative for back pain. Skin: Negative for rash. Neurological: Negative for headache.  ____________________________________________   PHYSICAL EXAM:  VITAL SIGNS: ED Triage Vitals  Enc Vitals Group     BP 08/13/17 1045 (!) 175/90     Pulse Rate 08/13/17 1045 73     Resp 08/13/17 1045 20     Temp 08/13/17 1045 98.3 F (36.8 C)     Temp Source 08/13/17 1045 Oral     SpO2 --      Weight 08/13/17 1046 164 lb (74.4 kg)     Height 08/13/17 1046 5' 3"  (1.6 m)     Head Circumference --      Peak Flow --      Pain Score 08/13/17 1045 7     Pain Loc --      Pain Edu? --      Excl. in [place]? --      Constitutional: Alert and oriented. Well appearing and in no distress. HEENT   Head: Normocephalic and atraumatic.      Eyes: Conjunctivae are normal. Pupils equal and round.       Ears:         Nose: No congestion/rhinnorhea.   Mouth/Throat: Mucous membranes are moist.   Neck: No stridor. Cardiovascular/Chest: Normal rate, regular rhythm.  No murmurs, rubs, or gallops. Respiratory:  Normal respiratory effort without tachypnea nor retractions. Breath sounds are clear and equal bilaterally. No wheezes/rales/rhonchi. Gastrointestinal: Soft. No distention, no guarding, no rebound. Nontender. obese  Genitourinary/rectal:Deferred Musculoskeletal: Nontender with normal range of motion in all extremities. No joint effusions.  No lower extremity tenderness.  No edema. Neurologic:  Normal speech and language. No gross or focal neurologic deficits are appreciated. Skin:  Skin is warm, dry and intact. No rash noted. Psychiatric: Mood and affect are normal. Speech and behavior are normal. Patient exhibits appropriate insight and judgment.   ____________________________________________  LABS (pertinent  positives/negatives) I, Lisa Roca, MD the attending physician have reviewed the labs noted below.  Labs Reviewed  URINALYSIS, COMPLETE (UACMP) WITH MICROSCOPIC - Abnormal; Notable for the following:       Result Value   Color, Urine YELLOW (*)    APPearance CLEAR (*)    Hgb urine dipstick MODERATE (*)    Ketones, ur 5 (*)    Leukocytes, UA SMALL (*)    Bacteria, UA RARE (*)    Squamous Epithelial / LPF 0-5 (*)    All other components within normal limits  BASIC METABOLIC PANEL - Abnormal; Notable for the following:    Glucose, Bld 134 (*)    All other components within normal limits  CBC WITH DIFFERENTIAL/PLATELET - Abnormal; Notable for the following:    Neutro Abs 8.3 (*)    Lymphs Abs 0.7 (*)    All other components within normal limits  URINE CULTURE    ____________________________________________    EKG I, Lisa Roca, MD, the attending physician have personally viewed and interpreted all ECGs.  none ____________________________________________  RADIOLOGY All Xrays were viewed by me.  Imaging interpreted by Radiologist, and I, Lisa Roca, MD the attending physician have reviewed the radiologist interpretation noted below.  renal  ultrasound:IMPRESSION: 1. Moderate right hydronephrosis and hydroureter extending down to the urinary bladder. There is a suggestion of a 5 mm stone in the vicinity of the right UVJ, although bilateral ureteral jets are visualized. __________________________________________  PROCEDURES  Procedure(s) performed: None  Critical Care performed: None  ____________________________________________  No current facility-administered medications on file prior to encounter.    Current Outpatient Prescriptions on File Prior to Encounter  Medication Sig Dispense Refill  . aspirin EC 81 MG tablet Take 0.81 mg by mouth daily.    . carvedilol (COREG) 3.125 MG tablet Take 6.25 mg by mouth 2 (two) times daily. 3.1 IN THE AM AND 3.1 AT NIGHT    . clopidogrel (PLAVIX) 75 MG tablet Take 75 mg by mouth daily.    Marland Kitchen escitalopram (LEXAPRO) 20 MG tablet TAKE 1 TABLET BY MOUTH ONCE DAILY. 30 tablet 0  . Mesalamine (DELZICOL) 400 MG CPDR DR capsule Take 6 capsules (2,400 mg total) by mouth daily. (Patient taking differently: Take 400 mg by mouth 2 (two) times daily. ) 180 capsule 2  . ranitidine (ZANTAC) 150 MG capsule Take 150 mg by mouth.    . rosuvastatin (CRESTOR) 5 MG tablet TAKE 1 TABLET BY MOUTH ONCE DAILY. 30 tablet 0  . verapamil (CALAN-SR) 120 MG CR tablet Take 120 mg by mouth at bedtime.    . clindamycin (CLEOCIN T) 1 % lotion     . EPINEPHrine 0.3 mg/0.3 mL IJ SOAJ injection 0.3 mg as needed.    . rizatriptan (MAXALT) 5 MG tablet USE AS DIRECTED 10 tablet 0  . tobramycin-dexamethasone (TOBRADEX) ophthalmic solution Place 1 drop into the right eye every 6 (six) hours. 5 mL 0    ____________________________________________  ED COURSE / ASSESSMENT AND PLAN  Pertinent labs & imaging results that were available during my care of the patient were reviewed by me and considered in my medical decision making (see chart for details).   description of symptoms seems most consistent with likely  ureterolithiasis. We'll start symptomatic treatments here as well as initiate workup. Given prior history of kidney stones, we'll start with renal ultrasound.   ultrasound shows a right-sided hydro-Will treat for kidney stone.  Patient pain controlled,ok for outpatient management.  DIFFERENTIAL DIAGNOSIS:Differential diagnosis includes,  but is not limited to, ovarian cyst, ovarian torsion, acute appendicitis, diverticulitis, urinary tract infection/pyelonephritis, endometriosis, bowel obstruction, colitis, renal colic, gastroenteritis, hernia, pregnancy related pain including ectopic pregnancy, etc.   CONSULTATIONS:   none   Patient / Family / Caregiver informed of clinical course, medical decision-making process, and agree with plan.   I discussed return precautions, follow-up instructions, and discharge instructions with patient and/or family.  Discharge Instructions : you're being treated for kidney stone. You may take over-the-counter ibuprofen 600 mg every 8 hours as needed for pain.  Make sure you to drink plenty of fluids.  You're being prescribed pain medication tramadol as needed for more severe pain. You're being prescribed Zofran for any nausea. You're being prescribed Flomax to help the urine flow.  You're being prescribed Macrobid antibiotic for possible urinary infection.  Return to the emergency room immediately for any worsening condition including new or uncontrolled pain, inability to urinate, fever, or any other symptoms concerning to you.  ___________________________________________   FINAL CLINICAL IMPRESSION(S) / ED DIAGNOSES   Final diagnoses:  Kidney stone              Note: This dictation was prepared with Dragon dictation. Any transcriptional errors that result from this process are unintentional    Lisa Roca, MD 08/13/17 1254

## 2017-08-13 NOTE — ED Triage Notes (Addendum)
Patient arrives from home via [facility] with RLQ and R flank pain since 0300. Patient states she has history of kidney stones and was seen recently for same.   64m zofran and 716m fentanyl given enroute

## 2017-08-13 NOTE — ED Notes (Signed)
Pt refusing oral pills at this time due to nausea. Given Iv nausea medication, will try again to give pills once nausea subsides.

## 2017-08-15 LAB — URINE CULTURE

## 2017-08-18 ENCOUNTER — Telehealth: Payer: Self-pay | Admitting: *Deleted

## 2017-08-18 DIAGNOSIS — N200 Calculus of kidney: Secondary | ICD-10-CM

## 2017-08-18 DIAGNOSIS — N1330 Unspecified hydronephrosis: Secondary | ICD-10-CM

## 2017-08-18 NOTE — Telephone Encounter (Signed)
Any place you would like to have worked in?

## 2017-08-18 NOTE — Telephone Encounter (Signed)
Pt has requested a ER follow, pt was advised to see Dr. Kavin Leech in 1 week. Please advise  Pt was seen in the ER on 10/05 for a kidney stone  Pt contact [phone]

## 2017-08-18 NOTE — Telephone Encounter (Signed)
Called patient she would like to be sent to Dr. Orinda Kenner. She was seen by her in the past. She states that after she got culture back and they have changed abx and she feels little better now.

## 2017-08-18 NOTE — Telephone Encounter (Signed)
Please confirm pt doing ok.  I do not mind seeing her, but after review of her records, she needs to see urology.  (given history of multiple kidney stones, recent stone and ultrasound findings) - she needs a f/u with urology.  I would like to schedule her an appt with urology asap.  Does she have a preference of who she sees.

## 2017-08-19 NOTE — Telephone Encounter (Signed)
Called patient she is fine with where ever you would like her to see. She has been seen at [facility] for Kidney and bp management.

## 2017-08-19 NOTE — Telephone Encounter (Signed)
Called patient no answer no v/m phone must be out.

## 2017-08-19 NOTE — Telephone Encounter (Signed)
Patient called back and states that unless you fell like Dr Judeth Horn would not be able to help with at all with her issues. She already has seen her and is comfortable with her. If not she will go to another provider but if all possible she would like to see her.

## 2017-08-19 NOTE — Telephone Encounter (Signed)
I have placed order to referral to urology (Dr Erlene Quan - female urologist in town).

## 2017-08-19 NOTE — Telephone Encounter (Signed)
Please call pt thi morning at [phone]

## 2017-08-19 NOTE — Telephone Encounter (Signed)
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  Given kidney stones and findings on ultrasound, she needs to see urology.  I would like to get her scheduled asap.  If agreeable, let me know.

## 2017-08-23 NOTE — Telephone Encounter (Signed)
Patient informed no questions at this time.

## 2017-08-24 ENCOUNTER — Other Ambulatory Visit: Payer: Self-pay | Admitting: Internal Medicine

## 2017-09-01 ENCOUNTER — Other Ambulatory Visit: Payer: Self-pay | Admitting: Internal Medicine

## 2017-09-09 ENCOUNTER — Encounter: Payer: Self-pay | Admitting: Urology

## 2017-09-09 ENCOUNTER — Ambulatory Visit (INDEPENDENT_AMBULATORY_CARE_PROVIDER_SITE_OTHER): Payer: BC Managed Care – PPO | Admitting: Urology

## 2017-09-09 VITALS — BP 111/67 | HR 72 | Temp 98.2°F | Ht 63.0 in | Wt 164.8 lb

## 2017-09-09 DIAGNOSIS — N200 Calculus of kidney: Secondary | ICD-10-CM | POA: Diagnosis not present

## 2017-09-09 DIAGNOSIS — K5090 Crohn's disease, unspecified, without complications: Secondary | ICD-10-CM | POA: Insufficient documentation

## 2017-09-09 DIAGNOSIS — R011 Cardiac murmur, unspecified: Secondary | ICD-10-CM | POA: Insufficient documentation

## 2017-09-09 DIAGNOSIS — M810 Age-related osteoporosis without current pathological fracture: Secondary | ICD-10-CM | POA: Insufficient documentation

## 2017-09-09 DIAGNOSIS — F32A Depression, unspecified: Secondary | ICD-10-CM | POA: Insufficient documentation

## 2017-09-09 DIAGNOSIS — I219 Acute myocardial infarction, unspecified: Secondary | ICD-10-CM | POA: Insufficient documentation

## 2017-09-09 DIAGNOSIS — F320 Major depressive disorder, single episode, mild: Secondary | ICD-10-CM | POA: Insufficient documentation

## 2017-09-09 DIAGNOSIS — F329 Major depressive disorder, single episode, unspecified: Secondary | ICD-10-CM

## 2017-09-09 NOTE — Progress Notes (Signed)
09/09/2017 1:57 PM   Morgan Moore [birth year] [id number]  Referring provider: Einar Pheasant, [address] [place], [place] [address]  Chief Complaint  Patient presents with  . Nephrolithiasis    HPI: The patient is a 61 year old female with past medical history significant for nephrolithiasis who presents today after being diagnosed a 5 mm  right UVJ calculus with hydroureteronephrosis and renal ultrasound on August 13, 2017.  She has since passed the stone.  Her pain has resolved.  She has no other stones on ultrasound.  This is at least her sixth stone episode.  She has passed all stones spontaneously never required surgery.  She is unsure of what her stones are made of.  She does have a history of a UTI approximately 1 year ago that was difficult to treat due to her drug allergies.  She also has a cystocele.  She has been seen by [facility] uro-gynecologist for this and is not interested in treatment.   PMH: Past Medical History:  Diagnosis Date  . Allergy   . Anxiety   . Chicken pox   . Coronary artery disease   . Crohn disease ([place])   . Heart disease   . Heart murmur   . Heart murmur   . Hyperlipidemia   . Hypertension   . Inflammatory bowel disease   . Migraines    hormonal, puberty  . Myocardial infarction ([place])   . Nephrolithiasis   . Phlebitis     Surgical History: Past Surgical History:  Procedure Laterality Date  . BREAST CYST ASPIRATION     unsure of side  . CORONARY ANGIOPLASTY WITH STENT PLACEMENT  2017  . heart murmur    . [place]  . TUBAL LIGATION  1993  . tubaligation  1990    Home Medications:  Allergies as of 09/09/2017      Reactions   Keflex [cephalexin] Anaphylaxis   "anaphylaxis"   Lisinopril Anaphylaxis   "anaphylaxis"   Sulfur Anaphylaxis   "anaphylaxis"   Tessalon [benzonatate] Anaphylaxis   "anaphylaxis"   Ciprofloxacin Swelling   Meclizine Swelling   "swelling"   Monurol [fosfomycin Tromethamine] Rash   rash      Medication List       Accurate as of 09/09/17  1:57 PM. Always use your most recent med list.          carvedilol 3.125 MG tablet Commonly known as:  COREG Take 1.56 mg by mouth 2 (two) times daily with a meal.   clopidogrel 75 MG tablet Commonly known as:  PLAVIX Take 75 mg by mouth daily.   EPINEPHrine 0.3 mg/0.3 mL Soaj injection Commonly known as:  EPI-PEN 0.3 mg as needed.   escitalopram 20 MG tablet Commonly known as:  LEXAPRO TAKE 1 TABLET BY MOUTH ONCE DAILY.   LIALDA PO Take 2 tablets by mouth 2 (two) times daily.   neomycin-polymyxin b-dexamethasone 3.5-10000-0.1 Susp Commonly known as:  MAXITROL Place 2 drops into both eyes every 6 (six) hours.   nitroGLYCERIN 0.4 MG SL tablet Commonly known as:  NITROSTAT Place 0.4 mg under the tongue every 5 (five) minutes as needed for chest pain.   ranitidine 150 MG capsule Commonly known as:  ZANTAC Take 150 mg by mouth.   rizatriptan 5 MG tablet Commonly known as:  MAXALT USE AS DIRECTED   rosuvastatin 5 MG tablet Commonly known as:  CRESTOR TAKE 1 TABLET BY MOUTH ONCE DAILY.   verapamil 120 MG  CR tablet Commonly known as:  CALAN-SR Take 120 mg by mouth at bedtime.       Allergies:  Allergies  Allergen Reactions  . Keflex [Cephalexin] Anaphylaxis    "anaphylaxis"   . Lisinopril Anaphylaxis    "anaphylaxis"  . Sulfur Anaphylaxis    "anaphylaxis"  . Tessalon [Benzonatate] Anaphylaxis    "anaphylaxis"  . Ciprofloxacin Swelling  . Meclizine Swelling    "swelling"  . Monurol [Fosfomycin Tromethamine] Rash    rash    Family History: Family History  Problem Relation Age of Onset  . Heart disease Father   . Heart disease Brother   . Cancer Maternal Aunt   . Breast cancer Maternal Aunt   . Stroke Mother   . Kidney disease Neg Hx   . GU problems Neg Hx   . Kidney cancer Neg Hx     Social History:  reports that she has never smoked. She  has never used smokeless tobacco. She reports that she does not drink alcohol or use drugs.  ROS:                                        Physical Exam: BP 111/67   Pulse 72   Temp 98.2 F (36.8 C) (Oral)   Ht 5' 3"  (1.6 m)   Wt 164 lb 12.8 oz (74.8 kg)   BMI 29.19 kg/m   Constitutional:  Alert and oriented, No acute distress. HEENT: [place] AT, moist mucus membranes.  Trachea midline, no masses. Cardiovascular: No clubbing, cyanosis, or edema. Respiratory: Normal respiratory effort, no increased work of breathing. GI: Abdomen is soft, nontender, nondistended, no abdominal masses GU: No CVA tenderness.  Skin: No rashes, bruises or suspicious lesions. Lymph: No cervical or inguinal adenopathy. Neurologic: Grossly intact, no focal deficits, moving all 4 extremities. Psychiatric: Normal mood and affect.  Laboratory Data: Lab Results  Component Value Date   WBC 9.6 08/13/2017   HGB 12.2 08/13/2017   HCT 35.9 08/13/2017   MCV 89.7 08/13/2017   PLT 312 08/13/2017    Lab Results  Component Value Date   CREATININE 0.89 08/13/2017    No results found for: PSA  No results found for: TESTOSTERONE  No results found for: HGBA1C  Urinalysis    Component Value Date/Time   COLORURINE YELLOW (A) 08/13/2017 1104   APPEARANCEUR CLEAR (A) 08/13/2017 1104   APPEARANCEUR HAZY 02/25/2015 0950   LABSPEC 1.015 08/13/2017 1104   LABSPEC 1.020 02/25/2015 0950   PHURINE 6.0 08/13/2017 1104   GLUCOSEU NEGATIVE 08/13/2017 1104   GLUCOSEU NEGATIVE 07/14/2016 1438   HGBUR MODERATE (A) 08/13/2017 1104   BILIRUBINUR NEGATIVE 08/13/2017 1104   BILIRUBINUR NEGATIVE 02/25/2015 0950   KETONESUR 5 (A) 08/13/2017 1104   PROTEINUR NEGATIVE 08/13/2017 1104   UROBILINOGEN 0.2 07/14/2016 1438   NITRITE NEGATIVE 08/13/2017 1104   LEUKOCYTESUR SMALL (A) 08/13/2017 1104   LEUKOCYTESUR TRACE 02/25/2015 0950    Pertinent Imaging: Renal u/s images reviewed. 5 mm right UVJ stone  with right hydroureteronephrosis  Assessment & Plan:    1.  Recurrent nephrolithiasis The patient has recurrent nephrolithiasis.  She has passed her only stone recently.  She will bring in for analysis.  Due to her recurrent nephrolithiasis, she will undergo metabolic workup.  She will return after her stone analysis and 24-hour urine study results are back.  Return for after 24 hr urine study and  stone analysis.  Nickie Retort, MD  [facility] [address] [place], [place] [address] [phone]

## 2017-09-21 ENCOUNTER — Ambulatory Visit (INDEPENDENT_AMBULATORY_CARE_PROVIDER_SITE_OTHER): Payer: BC Managed Care – PPO | Admitting: Internal Medicine

## 2017-09-21 ENCOUNTER — Encounter: Payer: Self-pay | Admitting: Internal Medicine

## 2017-09-21 DIAGNOSIS — K219 Gastro-esophageal reflux disease without esophagitis: Secondary | ICD-10-CM

## 2017-09-21 DIAGNOSIS — I2510 Atherosclerotic heart disease of native coronary artery without angina pectoris: Secondary | ICD-10-CM

## 2017-09-21 DIAGNOSIS — M79601 Pain in right arm: Secondary | ICD-10-CM

## 2017-09-21 DIAGNOSIS — N200 Calculus of kidney: Secondary | ICD-10-CM

## 2017-09-21 DIAGNOSIS — I10 Essential (primary) hypertension: Secondary | ICD-10-CM

## 2017-09-21 DIAGNOSIS — E7800 Pure hypercholesterolemia, unspecified: Secondary | ICD-10-CM | POA: Diagnosis not present

## 2017-09-21 DIAGNOSIS — F439 Reaction to severe stress, unspecified: Secondary | ICD-10-CM | POA: Diagnosis not present

## 2017-09-21 NOTE — Progress Notes (Addendum)
Patient ID: Alda Gaultney, female   DOB: Aug 30, 1956, 61 y.o.   MRN: [id number]   Subjective:    Patient ID: Charlesetta Milliron, female    DOB: Mar 14, 1956, 61 y.o.   MRN: [id number]  HPI  Patient here for a scheduled follow up.  She reports she is doing relatively well.  Recently had issues with kidney stone.  Saw urology 09/09/17.  Note reviewed.  Passed her stone. Pain resolved.  Currently undergoing w/up.  She is still having issues with her right hand.  Previously saw ortho.  Having right wrist and neck issues.  Note reviewed.  Still with right hand pain.  Brace does help.  Request referral back to ortho.  No chest pain.  Breathing stable.  No abdominal pain or cramping.  Bowels stable.     Past Medical History:  Diagnosis Date  . Allergy   . Anxiety   . Chicken pox   . Coronary artery disease   . Crohn disease ([place])   . Heart disease   . Heart murmur   . Heart murmur   . Hyperlipidemia   . Hypertension   . Inflammatory bowel disease   . Migraines    hormonal, puberty  . Myocardial infarction ([place])   . Nephrolithiasis   . Phlebitis    Past Surgical History:  Procedure Laterality Date  . BREAST CYST ASPIRATION     unsure of side  . CORONARY ANGIOPLASTY WITH STENT PLACEMENT  2017  . heart murmur    . [place]  . TUBAL LIGATION  1993  . tubaligation  1990   Family History  Problem Relation Age of Onset  . Heart disease Father   . Heart disease Brother   . Cancer Maternal Aunt   . Breast cancer Maternal Aunt   . Stroke Mother   . Kidney disease Neg Hx   . GU problems Neg Hx   . Kidney cancer Neg Hx    Social History   Socioeconomic History  . Marital status: Single    Spouse name: None  . Number of children: None  . Years of education: None  . Highest education level: None  Social Needs  . Financial resource strain: None  . Food insecurity - worry: None  . Food insecurity - inability: None  . Transportation needs -  medical: None  . Transportation needs - non-medical: None  Occupational History  . None  Tobacco Use  . Smoking status: Never Smoker  . Smokeless tobacco: Never Used  Substance and Sexual Activity  . Alcohol use: No    Alcohol/week: 0.0 oz  . Drug use: No  . Sexual activity: None  Other Topics Concern  . None  Social History Narrative  . None    Outpatient Encounter Medications as of 09/21/2017  Medication Sig  . carvedilol (COREG) 3.125 MG tablet Take 1.56 mg by mouth 2 (two) times daily with a meal.  . clopidogrel (PLAVIX) 75 MG tablet Take 75 mg by mouth daily.  Marland Kitchen EPINEPHrine 0.3 mg/0.3 mL IJ SOAJ injection 0.3 mg as needed.  Marland Kitchen escitalopram (LEXAPRO) 20 MG tablet TAKE 1 TABLET BY MOUTH ONCE DAILY.  Marland Kitchen Mesalamine (LIALDA PO) Take 2 tablets by mouth 2 (two) times daily.  Marland Kitchen neomycin-polymyxin b-dexamethasone (MAXITROL) 3.5-10000-0.1 SUSP Place 2 drops into both eyes every 6 (six) hours.  . nitroGLYCERIN (NITROSTAT) 0.4 MG SL tablet Place 0.4 mg under the tongue every 5 (five) minutes as needed for chest pain.  Marland Kitchen  ranitidine (ZANTAC) 150 MG capsule Take 150 mg by mouth.  . rizatriptan (MAXALT) 5 MG tablet USE AS DIRECTED  . rosuvastatin (CRESTOR) 5 MG tablet TAKE 1 TABLET BY MOUTH ONCE DAILY.  . verapamil (CALAN-SR) 120 MG CR tablet Take 120 mg by mouth at bedtime.   No facility-administered encounter medications on file as of 09/21/2017.     Review of Systems  Constitutional: Negative for appetite change and unexpected weight change.  HENT: Negative for congestion and sinus pressure.   Respiratory: Negative for cough, chest tightness and shortness of breath.   Cardiovascular: Negative for chest pain, palpitations and leg swelling.  Gastrointestinal: Negative for abdominal pain, diarrhea, nausea and vomiting.  Genitourinary: Negative for difficulty urinating and dysuria.  Musculoskeletal:       Hand pain as outlined.  Some back pain as well.   Skin: Negative for color change  and rash.  Neurological: Negative for dizziness, light-headedness and headaches.  Psychiatric/Behavioral: Negative for agitation and dysphoric mood.       Objective:    Physical Exam  Constitutional: She appears well-developed and well-nourished. No distress.  HENT:  Nose: Nose normal.  Mouth/Throat: Oropharynx is clear and moist.  Neck: Neck supple. No thyromegaly present.  Cardiovascular: Normal rate and regular rhythm.  2/6/systolic murmur.   Pulmonary/Chest: Breath sounds normal. No respiratory distress. She has no wheezes.  Abdominal: Soft. Bowel sounds are normal. There is no tenderness.  Musculoskeletal: She exhibits no edema or tenderness.  Lymphadenopathy:    She has no cervical adenopathy.  Skin: No rash noted. No erythema.  Psychiatric: She has a normal mood and affect. Her behavior is normal.    BP (!) 142/76 (BP Location: Left Arm, Patient Position: Sitting, Cuff Size: Normal)   Pulse 66   Temp 97.9 F (36.6 C) (Oral)   Resp 14   Wt 170 lb 3.2 oz (77.2 kg)   SpO2 98%   BMI 30.15 kg/m  Wt Readings from Last 3 Encounters:  09/21/17 170 lb 3.2 oz (77.2 kg)  09/09/17 164 lb 12.8 oz (74.8 kg)  08/13/17 164 lb (74.4 kg)     Lab Results  Component Value Date   WBC 9.6 08/13/2017   HGB 12.2 08/13/2017   HCT 35.9 08/13/2017   PLT 312 08/13/2017   GLUCOSE 134 (H) 08/13/2017   CHOL 175 08/12/2017   TRIG 116.0 08/12/2017   HDL 44.80 08/12/2017   LDLCALC 107 (H) 08/12/2017   ALT 13 08/12/2017   AST 14 08/12/2017   NA 139 08/13/2017   K 3.7 08/13/2017   CL 105 08/13/2017   CREATININE 0.89 08/13/2017   BUN 16 08/13/2017   CO2 25 08/13/2017   TSH 4.03 08/12/2017   INR 1.1 09/21/2014    US Renal  Result Date: 08/13/2017 CLINICAL DATA:  Right flank pain starting at 3 a.m. EXAM: RENAL / URINARY TRACT ULTRASOUND COMPLETE COMPARISON:  03/28/2017 CT scan FINDINGS: Right Kidney: Length: 9.6 cm. Moderate hydronephrosis and small amount of perirenal fluid. Of the  tiny right renal calculi shown on the 03/28/2017 exam are not well seen sonographically. Left Kidney: Length: 10.9 cm. Echogenicity within normal limits. No mass or hydronephrosis visualized. The tiny punctate left kidney lower pole calculi shown on recent CT exam are not well seen sonographically. Bladder: Five hyperechogenicity in the vicinity of the right UVJ, suspicious for UVJ calculus. There does seem to be a right ureteral jet as well as a left ureteral jet. IMPRESSION: 1. Moderate right hydronephrosis and hydroureter  extending down to the urinary bladder. There is a suggestion of a 5 mm stone in the vicinity of the right UVJ, although bilateral ureteral jets are visualized. Electronically Signed   By: Van Clines M.D.   On: 08/13/2017 12:36       Assessment & Plan:   Problem List Items Addressed This Visit    CAD (coronary artery disease)    Followed by cardiology. Stable.        Essential hypertension    Blood pressure has been under good control.  Slight elevation today.  Follow.  Have her spot check her pressure.        Relevant Orders   Basic metabolic panel   GERD (gastroesophageal reflux disease)    Controlled on current regimen.        Hypercholesterolemia    On crestor.  Low cholesterol diet and exercise.  Follow lipid panel and liver function tests.        Relevant Orders   Hepatic function panel   Lipid panel   Nephrolithiasis    Recently passed a stone.  Undergoing w/up with urology.        Right arm pain    Having hand, wrist and arm pain previously.  Saw ortho.  Note reviewed.  Still with hand pain.  Splint helps some.  Request referral back to ortho.  Also wants to discuss her back.        Relevant Orders   Ambulatory referral to Orthopedic Surgery   Stress    Doing better.  Follow.           Einar Pheasant, MD

## 2017-09-23 ENCOUNTER — Telehealth: Payer: Self-pay | Admitting: Internal Medicine

## 2017-09-23 NOTE — Telephone Encounter (Signed)
Noted  

## 2017-09-23 NOTE — Telephone Encounter (Signed)
Patient is requesting referral for ortho? Please advise

## 2017-09-23 NOTE — Telephone Encounter (Signed)
Copied from [place] [id number]. Topic: General - Other >> Sep 23, 2017 10:02 AM Yvette Rack wrote: Reason for CRM: PATIENT calling stating that Dr Nicki Reaper was supposed to be putting in a referral for her to go see a ortho about her hands but she also want them to see her about her back also

## 2017-09-23 NOTE — Telephone Encounter (Signed)
Copied from [place] [id number]. Topic: General - Other >> Sep 23, 2017 10:02 AM Yvette Rack wrote: Reason for CRM: PATIENT calling stating that Dr Nicki Reaper was supposed to be putting in a referral for her to go see a ortho about her hands but she also want them to see her about her back also  >> Sep 23, 2017 10:06 AM Yvette Rack wrote: referall for hands wrist and back

## 2017-09-24 ENCOUNTER — Encounter: Payer: Self-pay | Admitting: Internal Medicine

## 2017-09-24 NOTE — Addendum Note (Signed)
Addended by: Alisa Graff on: 09/24/2017 07:56 AM   Modules accepted: Orders

## 2017-09-24 NOTE — Assessment & Plan Note (Signed)
Blood pressure has been under good control.  Slight elevation today.  Follow.  Have her spot check her pressure.

## 2017-09-24 NOTE — Assessment & Plan Note (Signed)
Controlled on current regimen.   

## 2017-09-24 NOTE — Assessment & Plan Note (Signed)
Recently passed a stone.  Undergoing w/up with urology.

## 2017-09-24 NOTE — Assessment & Plan Note (Signed)
Followed by cardiology. Stable.

## 2017-09-24 NOTE — Assessment & Plan Note (Signed)
Doing better.  Follow.

## 2017-09-24 NOTE — Assessment & Plan Note (Signed)
On crestor.  Low cholesterol diet and exercise.  Follow lipid panel and liver function tests.

## 2017-09-24 NOTE — Assessment & Plan Note (Signed)
Having hand, wrist and arm pain previously.  Saw ortho.  Note reviewed.  Still with hand pain.  Splint helps some.  Request referral back to ortho.  Also wants to discuss her back.

## 2017-10-04 ENCOUNTER — Other Ambulatory Visit: Payer: Self-pay | Admitting: Internal Medicine

## 2017-10-04 ENCOUNTER — Other Ambulatory Visit: Payer: Self-pay | Admitting: Urology

## 2017-10-05 ENCOUNTER — Other Ambulatory Visit: Payer: BC Managed Care – PPO

## 2017-10-11 ENCOUNTER — Other Ambulatory Visit: Payer: Self-pay | Admitting: Urology

## 2017-10-15 DIAGNOSIS — G5601 Carpal tunnel syndrome, right upper limb: Secondary | ICD-10-CM | POA: Insufficient documentation

## 2017-10-15 DIAGNOSIS — M2170 Unequal limb length (acquired), unspecified site: Secondary | ICD-10-CM | POA: Insufficient documentation

## 2017-10-15 DIAGNOSIS — M47816 Spondylosis without myelopathy or radiculopathy, lumbar region: Secondary | ICD-10-CM | POA: Insufficient documentation

## 2017-10-15 DIAGNOSIS — M67431 Ganglion, right wrist: Secondary | ICD-10-CM | POA: Insufficient documentation

## 2017-10-19 ENCOUNTER — Other Ambulatory Visit: Payer: Self-pay | Admitting: Surgery

## 2017-10-19 ENCOUNTER — Other Ambulatory Visit: Payer: Self-pay | Admitting: Internal Medicine

## 2017-10-19 DIAGNOSIS — M67431 Ganglion, right wrist: Secondary | ICD-10-CM

## 2017-10-19 DIAGNOSIS — G5601 Carpal tunnel syndrome, right upper limb: Secondary | ICD-10-CM

## 2017-10-25 ENCOUNTER — Ambulatory Visit: Payer: BC Managed Care – PPO

## 2017-10-25 ENCOUNTER — Ambulatory Visit
Admission: RE | Admit: 2017-10-25 | Discharge: 2017-10-25 | Disposition: A | Discharge status: Home / Self Care | Payer: BC Managed Care – PPO | Source: Ambulatory Visit | Attending: Surgery | Admitting: Surgery

## 2017-10-25 DIAGNOSIS — G5601 Carpal tunnel syndrome, right upper limb: Secondary | ICD-10-CM

## 2017-10-25 DIAGNOSIS — M67431 Ganglion, right wrist: Secondary | ICD-10-CM | POA: Diagnosis present

## 2017-11-09 DIAGNOSIS — I499 Cardiac arrhythmia, unspecified: Secondary | ICD-10-CM

## 2017-11-09 DIAGNOSIS — R002 Palpitations: Secondary | ICD-10-CM

## 2017-11-09 HISTORY — DX: Palpitations: R00.2

## 2017-11-09 HISTORY — DX: Cardiac arrhythmia, unspecified: I49.9

## 2017-11-15 ENCOUNTER — Other Ambulatory Visit: Payer: Self-pay | Admitting: Internal Medicine

## 2017-11-16 ENCOUNTER — Other Ambulatory Visit: Payer: BC Managed Care – PPO

## 2017-11-19 ENCOUNTER — Other Ambulatory Visit: Payer: Self-pay

## 2017-11-19 ENCOUNTER — Encounter
Admission: RE | Admit: 2017-11-19 | Discharge: 2017-11-19 | Disposition: A | Discharge status: Home / Self Care | Payer: BC Managed Care – PPO | Source: Ambulatory Visit | Attending: Surgery | Admitting: Surgery

## 2017-11-19 DIAGNOSIS — Z01818 Encounter for other preprocedural examination: Secondary | ICD-10-CM | POA: Insufficient documentation

## 2017-11-19 DIAGNOSIS — I10 Essential (primary) hypertension: Secondary | ICD-10-CM | POA: Diagnosis not present

## 2017-11-19 DIAGNOSIS — I2510 Atherosclerotic heart disease of native coronary artery without angina pectoris: Secondary | ICD-10-CM | POA: Diagnosis not present

## 2017-11-19 DIAGNOSIS — R001 Bradycardia, unspecified: Secondary | ICD-10-CM | POA: Insufficient documentation

## 2017-11-19 HISTORY — DX: Cardiac arrhythmia, unspecified: I49.9

## 2017-11-19 HISTORY — DX: Other complications of anesthesia, initial encounter: T88.59XA

## 2017-11-19 HISTORY — DX: Dyspnea, unspecified: R06.00

## 2017-11-19 HISTORY — DX: Acute embolism and thrombosis of unspecified deep veins of unspecified lower extremity: I82.409

## 2017-11-19 HISTORY — DX: Gastro-esophageal reflux disease without esophagitis: K21.9

## 2017-11-19 HISTORY — DX: Adverse effect of unspecified anesthetic, initial encounter: T41.45XA

## 2017-11-19 LAB — BASIC METABOLIC PANEL
ANION GAP: 9 (ref 5–15)
BUN: 19 mg/dL (ref 6–20)
CO2: 25 mmol/L (ref 22–32)
CREATININE: 0.87 mg/dL (ref 0.44–1.00)
Calcium: 9.1 mg/dL (ref 8.9–10.3)
Chloride: 104 mmol/L (ref 101–111)
Glucose, Bld: 92 mg/dL (ref 65–99)
Potassium: 4.1 mmol/L (ref 3.5–5.1)
SODIUM: 138 mmol/L (ref 135–145)

## 2017-11-19 LAB — CBC
HEMATOCRIT: 36 % (ref 35.0–47.0)
Hemoglobin: 11.9 g/dL — ABNORMAL LOW (ref 12.0–16.0)
MCH: 29.2 pg (ref 26.0–34.0)
MCHC: 33.1 g/dL (ref 32.0–36.0)
MCV: 88.1 fL (ref 80.0–100.0)
PLATELETS: 327 10*3/uL (ref 150–440)
RBC: 4.08 MIL/uL (ref 3.80–5.20)
RDW: 15.2 % — ABNORMAL HIGH (ref 11.5–14.5)
WBC: 8.1 10*3/uL (ref 3.6–11.0)

## 2017-11-19 NOTE — Patient Instructions (Signed)
Your procedure is scheduled on: Thursday, December 02, 2017  Report to [facility]  To find out your arrival time please call [phone] between 1PM - 3PM on Wednesday , December 01, 2017   Remember: Instructions that are not followed completely may result in serious medical risk, up to and including death, or upon the discretion of your surgeon and anesthesiologist your surgery may need to be rescheduled.     _X__ 1. Do not eat food after midnight the night before your procedure.                 No gum chewing or hard candies. You may drink clear liquids up to 2 hours                 before you are scheduled to arrive for your surgery- DO not drink clear                 liquids within 2 hours of the start of your surgery.                 Clear Liquids include:  water, apple juice without pulp, clear carbohydrate                 drink such as Clearfast of [place], Black Coffee or Tea (Do not add                 anything to coffee or tea).     _X__ 2.  No Alcohol for 24 hours before or after surgery.   _X__ 3.  Do Not Smoke or use e-cigarettes For 24 Hours Prior to Your Surgery.                 Do not use any chewable tobacco products for at least 6 hours prior to                 surgery.  ____  4.  Bring all medications with you on the day of surgery if instructed.   ___X_  5.  Notify your doctor if there is any change in your medical condition      (cold, fever, infections).     Do not wear jewelry, make-up, hairpins, clips or nail polish. Do not wear lotions, powders, or perfumes. You may wear deodorant. Do not shave 48 hours prior to surgery. Men may shave face and neck. Do not bring valuables to the hospital.    [facility] is not responsible for any belongings or valuables.  Contacts, dentures or bridgework may not be worn into surgery. Leave your suitcase in the car. After surgery it may be brought to your room. For patients admitted to  the hospital, discharge time is determined by your treatment team.   Patients discharged the day of surgery will not be allowed to drive home.   Please read over the following fact sheets that you were given:   PREPARING FOR SURGERY   _X___ Take these medicines the morning of surgery with A SIP OF WATER:    1. COREG/CARVEDILOL  2. LIALDA, ESPECIALLY IF NOT A FIRST START CASE  3. ZANTAC  4. EYE DROPS  5. MAXALT, IF NEEDED  6.  ____ Fleet Enema (as directed)   ___X_ Use CHG Soap as directed  ____ Use inhalers on the day of surgery  ____ Stop metformin 2 days prior to surgery    ____ Take 1/2 of usual insulin dose the night before surgery.  No insulin the morning          of surgery.   ____ Stop ASPIRIN 3 DAYS BEFORE SURGERY . DO NOT TAKE AS OF THE 21ST OF January.            STOP PLAVIX A WEEK BEFORE SURGERY. DO NOT TAKE AS OF THE 17TH OF January.  __X__ Stop Anti-inflammatories AS OF THE 17TH OF January.  THIS INCLUDES IBUPROFEN / MOTRIN / ADVIL / ALEVE / NAPROSYN / GOODIES POWDERS             ACETAMINOPHEN OR TYLENOL IS OKAY TO TAKE AT ANY TIME.   ____ Stop supplements until after surgery.    CONTINUE TO TAKE ALL OTHER PRESCRIPTION MEDICINES AS PRESCRIBED.    JUST DO NOT TAKE THE ASPIRIN AND PLAVIX AS INSTRUCTED.  ____ Bring C-Pap to the hospital.

## 2017-11-23 ENCOUNTER — Other Ambulatory Visit: Payer: BC Managed Care – PPO

## 2017-11-23 NOTE — Pre-Procedure Instructions (Signed)
Message left on voicemail of Tiffany at Dr. Nicholaus Bloom office follow up on fax sent on 11/19/2017 regarding anaphylactic reaction to Cephalaxin, Ancef was ordered, did Dr. Roland Rack want to order a different antibiotic?

## 2017-12-01 ENCOUNTER — Other Ambulatory Visit: Payer: Self-pay | Admitting: Internal Medicine

## 2017-12-01 MED ORDER — CLINDAMYCIN PHOSPHATE 900 MG/50ML IV SOLN
900.0000 mg | Freq: Once | INTRAVENOUS | Status: AC
Start: 2017-12-01 — End: 2017-12-02
  Administered 2017-12-02: 900 mg via INTRAVENOUS

## 2017-12-02 ENCOUNTER — Ambulatory Visit: Payer: BC Managed Care – PPO | Admitting: Anesthesiology

## 2017-12-02 ENCOUNTER — Encounter: Payer: Self-pay | Admitting: *Deleted

## 2017-12-02 ENCOUNTER — Ambulatory Visit
Admission: RE | Admit: 2017-12-02 | Discharge: 2017-12-02 | Disposition: A | Discharge status: Home / Self Care | Payer: BC Managed Care – PPO | Source: Ambulatory Visit | Attending: Surgery | Admitting: Surgery

## 2017-12-02 ENCOUNTER — Encounter
Admission: RE | Disposition: A | Discharge status: Home / Self Care | Payer: Self-pay | Source: Ambulatory Visit | Attending: Surgery

## 2017-12-02 DIAGNOSIS — Z7902 Long term (current) use of antithrombotics/antiplatelets: Secondary | ICD-10-CM | POA: Diagnosis not present

## 2017-12-02 DIAGNOSIS — K219 Gastro-esophageal reflux disease without esophagitis: Secondary | ICD-10-CM | POA: Insufficient documentation

## 2017-12-02 DIAGNOSIS — I2510 Atherosclerotic heart disease of native coronary artery without angina pectoris: Secondary | ICD-10-CM | POA: Diagnosis not present

## 2017-12-02 DIAGNOSIS — Z882 Allergy status to sulfonamides status: Secondary | ICD-10-CM | POA: Insufficient documentation

## 2017-12-02 DIAGNOSIS — K589 Irritable bowel syndrome without diarrhea: Secondary | ICD-10-CM | POA: Insufficient documentation

## 2017-12-02 DIAGNOSIS — Z79899 Other long term (current) drug therapy: Secondary | ICD-10-CM | POA: Diagnosis not present

## 2017-12-02 DIAGNOSIS — Z955 Presence of coronary angioplasty implant and graft: Secondary | ICD-10-CM | POA: Insufficient documentation

## 2017-12-02 DIAGNOSIS — R011 Cardiac murmur, unspecified: Secondary | ICD-10-CM | POA: Insufficient documentation

## 2017-12-02 DIAGNOSIS — F419 Anxiety disorder, unspecified: Secondary | ICD-10-CM | POA: Diagnosis not present

## 2017-12-02 DIAGNOSIS — E785 Hyperlipidemia, unspecified: Secondary | ICD-10-CM | POA: Diagnosis not present

## 2017-12-02 DIAGNOSIS — Z7982 Long term (current) use of aspirin: Secondary | ICD-10-CM | POA: Diagnosis not present

## 2017-12-02 DIAGNOSIS — Z881 Allergy status to other antibiotic agents status: Secondary | ICD-10-CM | POA: Diagnosis not present

## 2017-12-02 DIAGNOSIS — Z888 Allergy status to other drugs, medicaments and biological substances status: Secondary | ICD-10-CM | POA: Diagnosis not present

## 2017-12-02 DIAGNOSIS — F329 Major depressive disorder, single episode, unspecified: Secondary | ICD-10-CM | POA: Insufficient documentation

## 2017-12-02 DIAGNOSIS — Z86718 Personal history of other venous thrombosis and embolism: Secondary | ICD-10-CM | POA: Diagnosis not present

## 2017-12-02 DIAGNOSIS — Z885 Allergy status to narcotic agent status: Secondary | ICD-10-CM | POA: Diagnosis not present

## 2017-12-02 DIAGNOSIS — G5601 Carpal tunnel syndrome, right upper limb: Secondary | ICD-10-CM | POA: Insufficient documentation

## 2017-12-02 DIAGNOSIS — I252 Old myocardial infarction: Secondary | ICD-10-CM | POA: Insufficient documentation

## 2017-12-02 DIAGNOSIS — I10 Essential (primary) hypertension: Secondary | ICD-10-CM | POA: Insufficient documentation

## 2017-12-02 DIAGNOSIS — M65831 Other synovitis and tenosynovitis, right forearm: Secondary | ICD-10-CM | POA: Diagnosis not present

## 2017-12-02 HISTORY — PX: CARPAL TUNNEL RELEASE: SHX101

## 2017-12-02 HISTORY — PX: GANGLION CYST EXCISION: SHX1691

## 2017-12-02 SURGERY — RELEASE, CARPAL TUNNEL, ENDOSCOPIC
Anesthesia: General | Laterality: Right

## 2017-12-02 MED ORDER — HYDROCODONE-ACETAMINOPHEN 5-325 MG PO TABS
ORAL_TABLET | ORAL | Status: AC
  Filled 2017-12-02: qty 1

## 2017-12-02 MED ORDER — LACTATED RINGERS IV SOLN
INTRAVENOUS | Status: DC | PRN
  Administered 2017-12-02: 14:00:00 via INTRAVENOUS

## 2017-12-02 MED ORDER — METOCLOPRAMIDE HCL 10 MG PO TABS: 5.0000 mg | ORAL_TABLET | Freq: Three times a day (TID) | ORAL | Status: DC | PRN

## 2017-12-02 MED ORDER — FENTANYL CITRATE (PF) 100 MCG/2ML IJ SOLN: 25.0000 ug | INTRAMUSCULAR | Status: DC | PRN

## 2017-12-02 MED ORDER — MIDAZOLAM HCL 2 MG/2ML IJ SOLN
INTRAMUSCULAR | Status: DC | PRN
  Administered 2017-12-02: 2 mg via INTRAVENOUS

## 2017-12-02 MED ORDER — METOCLOPRAMIDE HCL 5 MG/ML IJ SOLN: 5.0000 mg | Freq: Three times a day (TID) | INTRAMUSCULAR | Status: DC | PRN

## 2017-12-02 MED ORDER — PROPOFOL 10 MG/ML IV BOLUS
INTRAVENOUS | Status: AC
  Filled 2017-12-02: qty 20

## 2017-12-02 MED ORDER — LACTATED RINGERS IV SOLN
INTRAVENOUS | Status: DC
  Administered 2017-12-02: 13:00:00 via INTRAVENOUS

## 2017-12-02 MED ORDER — ONDANSETRON HCL 4 MG PO TABS: 4.0000 mg | ORAL_TABLET | Freq: Four times a day (QID) | ORAL | Status: DC | PRN

## 2017-12-02 MED ORDER — FENTANYL CITRATE (PF) 100 MCG/2ML IJ SOLN
INTRAMUSCULAR | Status: DC | PRN
  Administered 2017-12-02: 50 ug via INTRAVENOUS

## 2017-12-02 MED ORDER — MIDAZOLAM HCL 2 MG/2ML IJ SOLN
INTRAMUSCULAR | Status: AC
  Filled 2017-12-02: qty 2

## 2017-12-02 MED ORDER — HYDROCODONE-ACETAMINOPHEN 5-325 MG PO TABS: 1.0000 | ORAL_TABLET | Freq: Four times a day (QID) | ORAL | 0 refills | Status: DC | PRN

## 2017-12-02 MED ORDER — ONDANSETRON HCL 4 MG/2ML IJ SOLN: 4.0000 mg | Freq: Four times a day (QID) | INTRAMUSCULAR | Status: DC | PRN

## 2017-12-02 MED ORDER — SCOPOLAMINE 1 MG/3DAYS TD PT72
MEDICATED_PATCH | TRANSDERMAL | Status: AC
  Administered 2017-12-02: 1.5 mg via TRANSDERMAL
  Filled 2017-12-02: qty 1

## 2017-12-02 MED ORDER — LIDOCAINE HCL (PF) 2 % IJ SOLN
INTRAMUSCULAR | Status: AC
  Filled 2017-12-02: qty 10

## 2017-12-02 MED ORDER — HYDROCODONE-ACETAMINOPHEN 5-325 MG PO TABS: 1.0000 | ORAL_TABLET | ORAL | Status: DC | PRN

## 2017-12-02 MED ORDER — POTASSIUM CHLORIDE IN NACL 20-0.9 MEQ/L-% IV SOLN: INTRAVENOUS | Status: DC

## 2017-12-02 MED ORDER — MEPERIDINE HCL 50 MG/ML IJ SOLN: 6.2500 mg | INTRAMUSCULAR | Status: DC | PRN

## 2017-12-02 MED ORDER — SCOPOLAMINE 1 MG/3DAYS TD PT72
1.0000 | MEDICATED_PATCH | TRANSDERMAL | Status: DC
  Administered 2017-12-02: 1.5 mg via TRANSDERMAL

## 2017-12-02 MED ORDER — CLINDAMYCIN PHOSPHATE 900 MG/50ML IV SOLN
INTRAVENOUS | Status: AC
  Filled 2017-12-02: qty 50

## 2017-12-02 MED ORDER — PROMETHAZINE HCL 25 MG/ML IJ SOLN
6.2500 mg | INTRAMUSCULAR | Status: DC | PRN
Start: 2017-12-02 — End: 2017-12-02

## 2017-12-02 MED ORDER — FENTANYL CITRATE (PF) 100 MCG/2ML IJ SOLN
INTRAMUSCULAR | Status: AC
Start: 2017-12-02 — End: ?
  Filled 2017-12-02: qty 2

## 2017-12-02 MED ORDER — HYDROCODONE-ACETAMINOPHEN 5-325 MG PO TABS
1.0000 | ORAL_TABLET | Freq: Four times a day (QID) | ORAL | Status: DC | PRN
  Administered 2017-12-02: 1 via ORAL

## 2017-12-02 SURGICAL SUPPLY — 30 items
BNDG COHESIVE 4X5 TAN STRL (GAUZE/BANDAGES/DRESSINGS) ×3 IMPLANT
BNDG ELASTIC 2X5.8 VLCR STR LF (GAUZE/BANDAGES/DRESSINGS) ×3 IMPLANT
BNDG ESMARK 4X12 TAN STRL LF (GAUZE/BANDAGES/DRESSINGS) ×3 IMPLANT
CANISTER SUCT 1200ML W/VALVE (MISCELLANEOUS) ×3 IMPLANT
CHLORAPREP W/TINT 26ML (MISCELLANEOUS) ×3 IMPLANT
CORD BIP STRL DISP 12FT (MISCELLANEOUS) ×3 IMPLANT
CUFF TOURN 18 STER (MISCELLANEOUS) IMPLANT
DRAPE SURG 17X11 SM STRL (DRAPES) ×3 IMPLANT
FORCEPS JEWEL BIP 4-3/4 STR (INSTRUMENTS) ×3 IMPLANT
GAUZE PETRO XEROFOAM 1X8 (MISCELLANEOUS) ×3 IMPLANT
GAUZE SPONGE 4X4 12PLY STRL (GAUZE/BANDAGES/DRESSINGS) ×3 IMPLANT
GLOVE BIO SURGEON STRL SZ8 (GLOVE) ×9 IMPLANT
GLOVE INDICATOR 8.0 STRL GRN (GLOVE) ×9 IMPLANT
GOWN STRL REUS W/ TWL LRG LVL3 (GOWN DISPOSABLE) ×2 IMPLANT
GOWN STRL REUS W/ TWL XL LVL3 (GOWN DISPOSABLE) ×1 IMPLANT
GOWN STRL REUS W/TWL LRG LVL3 (GOWN DISPOSABLE) ×4
GOWN STRL REUS W/TWL XL LVL3 (GOWN DISPOSABLE) ×2
KIT CARPAL TUNNEL (MISCELLANEOUS) ×2
KIT ESCP INSRT D SLOT CANN KN (MISCELLANEOUS) ×1 IMPLANT
KIT RM TURNOVER STRD PROC AR (KITS) ×3 IMPLANT
NEEDLE HYPO 25X1 1.5 SAFETY (NEEDLE) ×3 IMPLANT
NS IRRIG 500ML POUR BTL (IV SOLUTION) ×3 IMPLANT
PACK EXTREMITY ARMC (MISCELLANEOUS) ×3 IMPLANT
PAD CAST CTTN 4X4 STRL (SOFTGOODS) ×1 IMPLANT
PADDING CAST COTTON 4X4 STRL (SOFTGOODS) ×2
SPLINT WRIST LG LT TX990309 (SOFTGOODS) IMPLANT
SPLINT WRIST M LT TX990308 (SOFTGOODS) IMPLANT
SPLINT WRIST M RT TX990303 (SOFTGOODS) ×3 IMPLANT
STOCKINETTE IMPERVIOUS 9X36 MD (GAUZE/BANDAGES/DRESSINGS) ×3 IMPLANT
SUT PROLENE 4 0 PS 2 18 (SUTURE) ×3 IMPLANT

## 2017-12-02 NOTE — Op Note (Signed)
12/02/2017  3:07 PM  Patient:   Morgan Moore  Pre-Op Diagnosis:   1. Right carpal tunnel syndrome.  2. Soft tissue mass volo-ulnar aspect of right wrist.  Post-Op Diagnosis:   1. Right carpal tunnel syndrome.  2. Flexor tenosynovitis, right wrist.  Procedure:   1. Endoscopic right carpal tunnel release.  2. Flexor tenosynovectomy, right wrist.  Surgeon:   Pascal Lux, MD  Assistant:  Marcellus Scott, PA-S  Anesthesia:   General LMA  Findings:   As above.  Complications:   None  EBL:   0 cc  Fluids:   400 cc crystalloid  TT:   47 minutes at 250 mmHg  Drains:   None  Closure:   4-0 Prolene interrupted sutures  Brief Clinical Note:   The patient is a 62 year old female with a history of right wrist and hand pain and paresthesias.  Her history and examination are consistent with carpal tunnel syndrome which was confirmed by EMG.  The patient presents at this time for an endoscopic right carpal tunnel release.  Notes recurrent swelling in the volar ulnar aspect of the right wrist.  An MRI scan showed no evidence for any soft tissue mass on the volo-ulnar aspect of the wrist, but did demonstrate a small ganglion cyst on the volo-radial aspect of the right wrist.  However, the patient insisted the volar ulnar aspect of the wrist is where the problem is.  Therefore, she requests that this area be explored as well.  Procedure:   The patient was brought into the operating room and lain in the supine position. After adequate general laryngeal mask anesthesia was achieved, the right hand and upper extremity were prepped with ChloraPrep solution before being draped sterilely. Preoperative antibiotics were administered. A timeout was performed to verify the appropriate surgical site before the limb was exsanguinated with an Esmarch and the tourniquet inflated to 250 mmHg. An approximately 1.5 cm incision was made over the volar wrist flexion crease, centered over the palmaris longus  tendon. The incision was carried down through the subcutaneous tissues with care taken to identify and protect any neurovascular structures. The distal forearm fascia was penetrated just proximal to the transverse carpal ligament. The soft tissues were released off the superficial and deep surfaces of the distal forearm fascia and this was released proximally for 3-4 cm under direct visualization.  Attention was directed distally. The Soil scientist was passed beneath the transverse carpal ligament along the ulnar aspect of the carpal tunnel and used to release any adhesions as well as to remove any adherent synovial tissue before first the smaller then the larger of the two dilators were passed beneath the transverse carpal ligament along the ulnar margin of the carpal tunnel. The slotted cannula was introduced and the endoscope was placed into the slotted cannula and the undersurface of the transverse carpal ligament visualized. The distal margin of the transverse carpal ligament was marked by placing a 25-gauge needle percutaneously at [place] cardinal point so that it entered the distal portion of the slotted cannula. Under endoscopic visualization, the transverse carpal ligament was released from proximal to distal using the end-cutting blade. A second pass was performed to ensure complete release of the ligament. The adequacy of release was verified both endoscopically and by palpation using the freer elevator.  Next, the soft tissue mass on the volo-ulnar aspect of the wrist was addressed.  An approximately 2.5-3 cm incision was made over the flexor carpi ulnaris tendon.  The incision  was carried down through the subcutaneous tissues with care taken to identify and protect any neurovascular structures.  The tendon was identified and dissected free.  There was some tenosynovitis around it, but no soft tissue masses were identified.  The ulnar artery and nerve were then inspected, but again no masses were  identified.  The ulnar artery and nerve were retracted ulnarly with the flexor carpi ulnaris tendon to access the flexor tendons.  Significant thickened tenosynovium was identified in this area so the tendons were carefully debrided free of this thickened tenosynovium.  Samples of this tenosynovium were sent to pathology.  The tendons themselves were intact.  Again, no discrete soft tissue masses are identified in this area.  The wounds were irrigated thoroughly with sterile saline solution before being closed using 4-0 Prolene interrupted sutures. A total of 10 cc of 0.5% plain Sensorcaine was injected in and around the two incisions before a sterile bulky dressing was applied to the wrist. The patient was placed into a volar wrist splint before being awakened, extubated, and returned to the recovery room in satisfactory condition after tolerating the procedure well.

## 2017-12-02 NOTE — Transfer of Care (Signed)
Immediate Anesthesia Transfer of Care Note  Patient: Morgan Moore  Procedure(s) Performed: CARPAL TUNNEL RELEASE ENDOSCOPIC (Right ) REMOVAL GANGLION OF WRIST (Right )  Patient Location: PACU  Anesthesia Type:General  Level of Consciousness: sedated  Airway & Oxygen Therapy: Patient Spontanous Breathing and Patient connected to face mask oxygen  Post-op Assessment: Report given to RN and Post -op Vital signs reviewed and stable  Post vital signs: Reviewed and stable  Last Vitals:  Vitals:   12/02/17 1227 12/02/17 1502  BP: 123/61 133/63  Pulse: 66 (!) 57  Resp: 16 17  Temp: 37.4 C 36.6 C  SpO2: 97% 100%    Last Pain:  Vitals:   12/02/17 1227  TempSrc: Tympanic  PainSc: 0-No pain         Complications: No apparent anesthesia complications

## 2017-12-02 NOTE — Discharge Instructions (Addendum)
AMBULATORY SURGERY  DISCHARGE INSTRUCTIONS   1) The drugs that you were given will stay in your system until tomorrow so for the next 24 hours you should not:  A) Drive an automobile B) Make any legal decisions C) Drink any alcoholic beverage   2) You may resume regular meals tomorrow.  Today it is better to start with liquids and gradually work up to solid foods.  You may eat anything you prefer, but it is better to start with liquids, then soup and crackers, and gradually work up to solid foods.   3) Please notify your doctor immediately if you have any unusual bleeding, trouble breathing, redness and pain at the surgery site, drainage, fever, or pain not relieved by medication. 4)   5) Your post-operative visit with Dr.                                     is: Date:                        Time:    Please call to schedule your post-operative visit.  6) Additional Instructions:     Keep dressing dry and intact. Keep hand elevated above heart level. May shower after dressing removed on postop day 4 (Monday). Cover sutures with Band-Aids after drying off, then reapply Velcro splint. Apply ice to affected area frequently. Take ibuprofen 600-800 mg TID with meals for 7-10 days, then as necessary. Take ES Tylenol or pain medication as prescribed when needed.  Return for follow-up in 10-14 days or as scheduled.  AMBULATORY SURGERY  DISCHARGE INSTRUCTIONS   7) The drugs that you were given will stay in your system until tomorrow so for the next 24 hours you should not:  D) Drive an automobile E) Make any legal decisions F) Drink any alcoholic beverage   8) You may resume regular meals tomorrow.  Today it is better to start with liquids and gradually work up to solid foods.  You may eat anything you prefer, but it is better to start with liquids, then soup and crackers, and gradually work up to solid foods.   9) Please notify your doctor immediately if you have any  unusual bleeding, trouble breathing, redness and pain at the surgery site, drainage, fever, or pain not relieved by medication.    10) Additional Instructions:        Please contact your physician with any problems or Same Day Surgery at [phone], Monday through Friday 6 am to 4 pm, or [place] at [facility] number at [phone].

## 2017-12-02 NOTE — Anesthesia Preprocedure Evaluation (Addendum)
Anesthesia Evaluation  Patient identified by MRN, date of birth, ID band Patient awake    Reviewed: Allergy & Precautions, NPO status , Patient's Chart, lab work & pertinent test results  History of Anesthesia Complications (+) PONV and history of anesthetic complications  Airway Mallampati: II  TM Distance: <3 FB Neck ROM: Full    Dental no notable dental hx.    Pulmonary neg sleep apnea, neg COPD,    breath sounds clear to auscultation- rhonchi (-) wheezing      Cardiovascular hypertension, + CAD, + Past MI and + Cardiac Stents (PCI and stent at [facility] to circumflex August 2017 )   Rhythm:Regular Rate:Normal - Systolic murmurs and - Diastolic murmurs    Neuro/Psych  Headaches, PSYCHIATRIC DISORDERS Anxiety Depression    GI/Hepatic Neg liver ROS, GERD  ,  Endo/Other  negative endocrine ROSneg diabetes  Renal/GU Renal disease: hx of nephrolithiasis.     Musculoskeletal negative musculoskeletal ROS (+)   Abdominal (+) - obese,   Peds  Hematology negative hematology ROS (+)   Anesthesia Other Findings Past Medical History: No date: Allergy No date: Anxiety No date: Chicken pox No date: Complication of anesthesia     Comment:  vomitting No date: Coronary artery disease No date: Crohn disease ([place]) 2017: DVT (deep venous thrombosis) ([facility]) No date: Dyspnea     Comment:  on excertion 3567: Dysrhythmia     Comment:  palpitations No date: GERD (gastroesophageal reflux disease) No date: Heart disease No date: Heart murmur No date: Heart murmur No date: Hyperlipidemia No date: Hypertension No date: Inflammatory bowel disease No date: Migraines     Comment:  hormonal, puberty No date: Myocardial infarction ([place]) No date: Nephrolithiasis No date: Phlebitis   Reproductive/Obstetrics                            Anesthesia Physical Anesthesia Plan  ASA: III  Anesthesia Plan: General    Post-op Pain Management:    Induction: Intravenous  PONV Risk Score and Plan: 3 and Ondansetron, Dexamethasone, Midazolam and Scopolamine patch - Pre-op  Airway Management Planned: LMA  Additional Equipment:   Intra-op Plan:   Post-operative Plan:   Informed Consent: I have reviewed the patients History and Physical, chart, labs and discussed the procedure including the risks, benefits and alternatives for the proposed anesthesia with the patient or authorized representative who has indicated his/her understanding and acceptance.   Dental advisory given  Plan Discussed with: CRNA and Anesthesiologist  Anesthesia Plan Comments:         Anesthesia Quick Evaluation

## 2017-12-02 NOTE — Anesthesia Post-op Follow-up Note (Signed)
Anesthesia QCDR form completed.        

## 2017-12-02 NOTE — H&P (Signed)
Paper H&P to be scanned into permanent record. H&P reviewed and patient re-examined. No changes. 

## 2017-12-02 NOTE — Anesthesia Postprocedure Evaluation (Signed)
Anesthesia Post Note  Patient: Morgan Moore  Procedure(s) Performed: CARPAL TUNNEL RELEASE ENDOSCOPIC (Right ) REMOVAL GANGLION OF WRIST (Right )  Patient location during evaluation: PACU Anesthesia Type: General Level of consciousness: awake and alert and oriented Pain management: pain level controlled Vital Signs Assessment: post-procedure vital signs reviewed and stable Respiratory status: spontaneous breathing, nonlabored ventilation and respiratory function stable Cardiovascular status: blood pressure returned to baseline and stable Postop Assessment: no signs of nausea or vomiting Anesthetic complications: no     Last Vitals:  Vitals:   12/02/17 1547 12/02/17 1557  BP: 130/69 134/64  Pulse: 64 64  Resp: 18 16  Temp: 36.7 C 36.7 C  SpO2: 99% 100%    Last Pain:  Vitals:   12/02/17 1557  TempSrc: Temporal  PainSc: 7                  Evanie Buckle

## 2017-12-03 ENCOUNTER — Encounter: Payer: Self-pay | Admitting: Surgery

## 2017-12-03 DIAGNOSIS — M659 Synovitis and tenosynovitis, unspecified: Secondary | ICD-10-CM | POA: Insufficient documentation

## 2017-12-06 LAB — SURGICAL PATHOLOGY

## 2017-12-17 ENCOUNTER — Other Ambulatory Visit (INDEPENDENT_AMBULATORY_CARE_PROVIDER_SITE_OTHER): Payer: BC Managed Care – PPO

## 2017-12-17 DIAGNOSIS — E7800 Pure hypercholesterolemia, unspecified: Secondary | ICD-10-CM | POA: Diagnosis not present

## 2017-12-17 DIAGNOSIS — I10 Essential (primary) hypertension: Secondary | ICD-10-CM

## 2017-12-17 LAB — LIPID PANEL
CHOL/HDL RATIO: 4
CHOLESTEROL: 179 mg/dL (ref 0–200)
HDL: 43.8 mg/dL (ref >39.00)
LDL CALC: 114 mg/dL — AB (ref 0–99)
NonHDL: 135.24
Triglycerides: 105 mg/dL (ref 0.0–149.0)
VLDL: 21 mg/dL (ref 0.0–40.0)

## 2017-12-17 LAB — HEPATIC FUNCTION PANEL
ALT: 21 U/L (ref 0–35)
AST: 17 U/L (ref 0–37)
Albumin: 4.1 g/dL (ref 3.5–5.2)
Alkaline Phosphatase: 88 U/L (ref 39–117)
BILIRUBIN TOTAL: 0.6 mg/dL (ref 0.2–1.2)
Bilirubin, Direct: 0.1 mg/dL (ref 0.0–0.3)
Total Protein: 7.1 g/dL (ref 6.0–8.3)

## 2017-12-17 LAB — BASIC METABOLIC PANEL
BUN: 20 mg/dL (ref 6–23)
CHLORIDE: 105 meq/L (ref 96–112)
CO2: 29 meq/L (ref 19–32)
CREATININE: 0.98 mg/dL (ref 0.40–1.20)
Calcium: 9.3 mg/dL (ref 8.4–10.5)
GFR: 60.95 mL/min (ref >60.00)
Glucose, Bld: 98 mg/dL (ref 70–99)
POTASSIUM: 3.8 meq/L (ref 3.5–5.1)
Sodium: 140 mEq/L (ref 135–145)

## 2017-12-23 ENCOUNTER — Encounter: Payer: Self-pay | Admitting: Internal Medicine

## 2017-12-23 ENCOUNTER — Ambulatory Visit: Payer: BC Managed Care – PPO | Admitting: Internal Medicine

## 2017-12-23 VITALS — BP 128/70 | HR 82 | Temp 98.5°F | Resp 16 | Wt 168.2 lb

## 2017-12-23 DIAGNOSIS — K219 Gastro-esophageal reflux disease without esophagitis: Secondary | ICD-10-CM

## 2017-12-23 DIAGNOSIS — F439 Reaction to severe stress, unspecified: Secondary | ICD-10-CM | POA: Diagnosis not present

## 2017-12-23 DIAGNOSIS — K50919 Crohn's disease, unspecified, with unspecified complications: Secondary | ICD-10-CM

## 2017-12-23 DIAGNOSIS — F32A Depression, unspecified: Secondary | ICD-10-CM

## 2017-12-23 DIAGNOSIS — I10 Essential (primary) hypertension: Secondary | ICD-10-CM | POA: Diagnosis not present

## 2017-12-23 DIAGNOSIS — R238 Other skin changes: Secondary | ICD-10-CM

## 2017-12-23 DIAGNOSIS — R42 Dizziness and giddiness: Secondary | ICD-10-CM | POA: Diagnosis not present

## 2017-12-23 DIAGNOSIS — I2510 Atherosclerotic heart disease of native coronary artery without angina pectoris: Secondary | ICD-10-CM

## 2017-12-23 DIAGNOSIS — D649 Anemia, unspecified: Secondary | ICD-10-CM | POA: Diagnosis not present

## 2017-12-23 DIAGNOSIS — F329 Major depressive disorder, single episode, unspecified: Secondary | ICD-10-CM

## 2017-12-23 DIAGNOSIS — R233 Spontaneous ecchymoses: Secondary | ICD-10-CM

## 2017-12-23 DIAGNOSIS — E7800 Pure hypercholesterolemia, unspecified: Secondary | ICD-10-CM

## 2017-12-23 LAB — CBC WITH DIFFERENTIAL/PLATELET
BASOS ABS: 0 10*3/uL (ref 0.0–0.1)
Basophils Relative: 0.5 % (ref 0.0–3.0)
EOS PCT: 3.7 % (ref 0.0–5.0)
Eosinophils Absolute: 0.3 10*3/uL (ref 0.0–0.7)
HEMATOCRIT: 35 % — AB (ref 36.0–46.0)
Hemoglobin: 11.7 g/dL — ABNORMAL LOW (ref 12.0–15.0)
LYMPHS ABS: 2 10*3/uL (ref 0.7–4.0)
LYMPHS PCT: 25.3 % (ref 12.0–46.0)
MCHC: 33.3 g/dL (ref 30.0–36.0)
MCV: 88.1 fl (ref 78.0–100.0)
Monocytes Absolute: 0.7 10*3/uL (ref 0.1–1.0)
Monocytes Relative: 8.9 % (ref 3.0–12.0)
NEUTROS ABS: 4.8 10*3/uL (ref 1.4–7.7)
Neutrophils Relative %: 61.6 % (ref 43.0–77.0)
PLATELETS: 338 10*3/uL (ref 150.0–400.0)
RBC: 3.97 Mil/uL (ref 3.87–5.11)
RDW: 15.8 % — ABNORMAL HIGH (ref 11.5–15.5)
WBC: 7.8 10*3/uL (ref 4.0–10.5)

## 2017-12-23 LAB — FERRITIN: Ferritin: 8.6 ng/mL — ABNORMAL LOW (ref 10.0–291.0)

## 2017-12-23 LAB — PROTIME-INR
INR: 1 ratio (ref 0.8–1.0)
PROTHROMBIN TIME: 11.3 s (ref 9.6–13.1)

## 2017-12-23 LAB — VITAMIN B12: Vitamin B-12: 393 pg/mL (ref 211–911)

## 2017-12-23 LAB — APTT: aPTT: 27.7 s (ref 23.4–32.7)

## 2017-12-23 NOTE — Progress Notes (Signed)
Patient ID: Morgan Moore, female   DOB: [birth year], 62 y.o.   MRN: [id number]   Subjective:    Patient ID: Morgan Moore, female    DOB: 1956/08/14, 62 y.o.   MRN: [id number]  HPI  Patient here for a scheduled follow up.  She reports noticing increased bruising recently.   Is s/p carpal tunnel surgery.  Was off plavix.  Back on now and feels she is bruising more.  Has bruises on her right arm.  Doing well from her surgery.  Does report some increased discomfort/fullnes - right popliteal region.  Appears to be c/w Bakers cyst.  No leg swelling or redness.  No calf tenderness.  She does report noticing a little light headedness at times.  Intermittent.  Will notice occasionally after has been sitting and then stands up.  Trying to stay hydrated.  No vomiting.  No diarrhea.  Just saw Dr Clayborn Bigness 10/25/17.  No changes made.  Discussed the light headedness with him.  Overall she feels she is doing relatively well and desires not to make any changes.     Past Medical History:  Diagnosis Date  . Allergy   . Anxiety   . Chicken pox   . Complication of anesthesia    vomitting  . Coronary artery disease   . Crohn disease ([place])   . DVT (deep venous thrombosis) ([place]) 2017  . Dyspnea    on excertion  . Dysrhythmia 2019   palpitations  . GERD (gastroesophageal reflux disease)   . Heart disease   . Heart murmur   . Heart murmur   . Hyperlipidemia   . Hypertension   . Inflammatory bowel disease   . Migraines    hormonal, puberty  . Myocardial infarction ([place])   . Nephrolithiasis   . Phlebitis    Past Surgical History:  Procedure Laterality Date  . BREAST CYST ASPIRATION     unsure of side  . CARPAL TUNNEL RELEASE Right 12/02/2017   Procedure: CARPAL TUNNEL RELEASE ENDOSCOPIC;  Surgeon: Corky Mull, MD;  Location: [facility] ORS;  Service: Orthopedics;  Laterality: Right;  . CORONARY ANGIOPLASTY WITH STENT PLACEMENT  2017   x 2  . EYE SURGERY Bilateral    cataract surgery  .  GANGLION CYST EXCISION Right 12/02/2017   Procedure: REMOVAL GANGLION OF WRIST;  Surgeon: Corky Mull, MD;  Location: [facility] ORS;  Service: Orthopedics;  Laterality: Right;  . heart murmur    . [place]  . TUBAL LIGATION  1993  . tubaligation  1990   Family History  Problem Relation Age of Onset  . Heart disease Father   . Heart disease Brother   . Cancer Maternal Aunt   . Breast cancer Maternal Aunt   . Stroke Mother   . Kidney disease Neg Hx   . GU problems Neg Hx   . Kidney cancer Neg Hx    Social History   Socioeconomic History  . Marital status: Single    Spouse name: None  . Number of children: None  . Years of education: None  . Highest education level: None  Social Needs  . Financial resource strain: None  . Food insecurity - worry: None  . Food insecurity - inability: None  . Transportation needs - medical: None  . Transportation needs - non-medical: None  Occupational History  . None  Tobacco Use  . Smoking status: Never Smoker  . Smokeless tobacco: Never Used  Substance and Sexual  Activity  . Alcohol use: No    Alcohol/week: 0.0 oz  . Drug use: No  . Sexual activity: None  Other Topics Concern  . None  Social History Narrative  . None    Outpatient Encounter Medications as of 12/23/2017  Medication Sig  . aspirin EC 81 MG tablet Take 81 mg by mouth daily.  . carvedilol (COREG) 3.125 MG tablet Take 1.5625 mg by mouth 2 (two) times daily with a meal.   . clopidogrel (PLAVIX) 75 MG tablet Take 75 mg by mouth daily.  Marland Kitchen EPINEPHrine 0.3 mg/0.3 mL IJ SOAJ injection Inject 0.3 mg into the muscle once.   . escitalopram (LEXAPRO) 20 MG tablet TAKE 1 TABLET BY MOUTH ONCE DAILY.  Marland Kitchen HYDROcodone-acetaminophen (NORCO/VICODIN) 5-325 MG tablet Take 1-2 tablets by mouth every 6 (six) hours as needed for moderate pain.  . Melatonin 3-10 MG TABS Take 3 mg by mouth at bedtime.  . mesalamine (LIALDA) 1.2 g EC tablet Take 1.2 g by mouth 2 (two)  times daily.   . nitroGLYCERIN (NITROSTAT) 0.4 MG SL tablet Place 0.4 mg under the tongue every 5 (five) minutes as needed for chest pain.  . prednisoLONE acetate (PRED FORTE) 1 % ophthalmic suspension Place 1 drop into both eyes 2 (two) times daily as needed (for inflammation).  . ranitidine (ZANTAC) 150 MG tablet Take 1 tablet (150 mg total) by mouth 2 (two) times daily.  . rizatriptan (MAXALT) 5 MG tablet USE AS DIRECTED (Patient taking differently: Take 5 mg by mouth as needed for migraine)  . rosuvastatin (CRESTOR) 5 MG tablet TAKE 1 TABLET BY MOUTH ONCE DAILY.  . verapamil (CALAN-SR) 120 MG CR tablet Take 120 mg by mouth at bedtime.   No facility-administered encounter medications on file as of 12/23/2017.     Review of Systems  Constitutional: Negative for appetite change and unexpected weight change.  HENT: Negative for congestion and sinus pressure.   Respiratory: Negative for cough and chest tightness.        Breathing stable.    Cardiovascular: Negative for chest pain, palpitations and leg swelling.  Gastrointestinal: Negative for abdominal pain, diarrhea, nausea and vomiting.  Genitourinary: Negative for difficulty urinating and dysuria.  Musculoskeletal: Negative for myalgias.       Right posterior knee - discomfort as outlined.    Skin: Negative for color change and rash.  Neurological: Positive for light-headedness. Negative for headaches.  Psychiatric/Behavioral: Negative for agitation and dysphoric mood.       Objective:     Blood pressure standing:  132/72  Physical Exam  Constitutional: She appears well-developed and well-nourished. No distress.  HENT:  Nose: Nose normal.  Mouth/Throat: Oropharynx is clear and moist.  Neck: Neck supple. No thyromegaly present.  Cardiovascular: Normal rate and regular rhythm.  Pulmonary/Chest: Breath sounds normal. No respiratory distress. She has no wheezes.  Abdominal: Soft. Bowel sounds are normal. There is no tenderness.    Musculoskeletal: She exhibits no edema or tenderness.  Lymphadenopathy:    She has no cervical adenopathy.  Skin: No rash noted. No erythema.  Psychiatric: She has a normal mood and affect. Her behavior is normal.    BP 128/70 (BP Location: Left Arm, Patient Position: Sitting, Cuff Size: Normal)   Pulse 82   Temp 98.5 F (36.9 C) (Oral)   Resp 16   Wt 168 lb 3.2 oz (76.3 kg)   SpO2 98%   BMI 29.80 kg/m  Wt Readings from Last 3 Encounters:  12/23/17 168  lb 3.2 oz (76.3 kg)  11/19/17 167 lb (75.8 kg)  09/21/17 170 lb 3.2 oz (77.2 kg)     Lab Results  Component Value Date   WBC 7.8 12/23/2017   HGB 11.7 (L) 12/23/2017   HCT 35.0 (L) 12/23/2017   PLT 338.0 12/23/2017   GLUCOSE 98 12/17/2017   CHOL 179 12/17/2017   TRIG 105.0 12/17/2017   HDL 43.80 12/17/2017   LDLCALC 114 (H) 12/17/2017   ALT 21 12/17/2017   AST 17 12/17/2017   NA 140 12/17/2017   K 3.8 12/17/2017   CL 105 12/17/2017   CREATININE 0.98 12/17/2017   BUN 20 12/17/2017   CO2 29 12/17/2017   TSH 4.03 08/12/2017   INR 1.0 12/23/2017       Assessment & Plan:   Problem List Items Addressed This Visit    Anemia - Primary    Recheck cbc.       Relevant Orders   CBC with Differential/Platelet (Completed)   Ferritin (Completed)   Vitamin B12 (Completed)   CAD (coronary artery disease)    Followed by cardiology.  Just evaluated 10/25/17.  Stable.  No changes made.  Follow.       Crohn disease ([place])    Stable.        Depression    Doing well on current regimen.  Follow.       Easy bruising    Bruising as outlined.  Recent liver panel wnl.  Check cbc, PTT, PT/INR.        Relevant Orders   Protime-INR (Completed)   PTT (Completed)   Essential hypertension    Blood pressure under good control.  Continue same medication regimen.  Follow pressures.  Follow metabolic panel.  No significant drop in blood pressure with standing.  Hold on making changes.  Follow.        GERD (gastroesophageal  reflux disease)    Controlled on current regimen.  Follow.       Hypercholesterolemia    On crestor.  Low cholesterol diet and exercise.  Follow lipid panel and liver function tests.  Discussed increasing frequency.  Discussed recent lab results.        Light headedness    Notices occasionally.  Discussed with her today.  No significant drop in her pressure with standing.  Staying hydrated.  Eating well.  Just saw cardiology.  No changes made.  Overall she feels things are stable. Hold on making adjustments.  Follow.        Stress    Overall handling things relatively well.  Follow.           Einar Pheasant, MD

## 2017-12-25 ENCOUNTER — Other Ambulatory Visit: Payer: Self-pay | Admitting: Internal Medicine

## 2017-12-25 DIAGNOSIS — D509 Iron deficiency anemia, unspecified: Secondary | ICD-10-CM

## 2017-12-25 NOTE — Progress Notes (Signed)
Order placed for referral to GI

## 2017-12-26 ENCOUNTER — Encounter: Payer: Self-pay | Admitting: Internal Medicine

## 2017-12-26 NOTE — Assessment & Plan Note (Signed)
Bruising as outlined.  Recent liver panel wnl.  Check cbc, PTT, PT/INR.

## 2017-12-26 NOTE — Assessment & Plan Note (Signed)
Doing well on current regimen.  Follow.

## 2017-12-26 NOTE — Assessment & Plan Note (Signed)
Blood pressure under good control.  Continue same medication regimen.  Follow pressures.  Follow metabolic panel.  No significant drop in blood pressure with standing.  Hold on making changes.  Follow.

## 2017-12-26 NOTE — Assessment & Plan Note (Signed)
Controlled on current regimen.  Follow.  

## 2017-12-26 NOTE — Assessment & Plan Note (Signed)
Stable

## 2017-12-26 NOTE — Assessment & Plan Note (Signed)
Followed by cardiology.  Just evaluated 10/25/17.  Stable.  No changes made.  Follow.

## 2017-12-26 NOTE — Assessment & Plan Note (Signed)
Overall handling things relatively well.  Follow.

## 2017-12-26 NOTE — Assessment & Plan Note (Signed)
Recheck cbc.  

## 2017-12-26 NOTE — Assessment & Plan Note (Addendum)
On crestor.  Low cholesterol diet and exercise.  Follow lipid panel and liver function tests.  Discussed increasing frequency.  Discussed recent lab results.

## 2017-12-26 NOTE — Assessment & Plan Note (Signed)
Notices occasionally.  Discussed with her today.  No significant drop in her pressure with standing.  Staying hydrated.  Eating well.  Just saw cardiology.  No changes made.  Overall she feels things are stable. Hold on making adjustments.  Follow.

## 2018-01-17 DIAGNOSIS — D508 Other iron deficiency anemias: Secondary | ICD-10-CM | POA: Insufficient documentation

## 2018-01-18 ENCOUNTER — Ambulatory Visit: Payer: BC Managed Care – PPO | Admitting: Internal Medicine

## 2018-01-18 ENCOUNTER — Encounter: Payer: Self-pay | Admitting: Internal Medicine

## 2018-01-18 VITALS — BP 136/68 | HR 76 | Temp 98.2°F | Ht 63.0 in | Wt 169.2 lb

## 2018-01-18 DIAGNOSIS — E559 Vitamin D deficiency, unspecified: Secondary | ICD-10-CM | POA: Diagnosis not present

## 2018-01-18 DIAGNOSIS — B029 Zoster without complications: Secondary | ICD-10-CM

## 2018-01-18 DIAGNOSIS — I2510 Atherosclerotic heart disease of native coronary artery without angina pectoris: Secondary | ICD-10-CM | POA: Diagnosis not present

## 2018-01-18 DIAGNOSIS — K50919 Crohn's disease, unspecified, with unspecified complications: Secondary | ICD-10-CM

## 2018-01-18 DIAGNOSIS — R238 Other skin changes: Secondary | ICD-10-CM | POA: Diagnosis not present

## 2018-01-18 DIAGNOSIS — R233 Spontaneous ecchymoses: Secondary | ICD-10-CM

## 2018-01-18 MED ORDER — VALACYCLOVIR HCL 500 MG PO TABS
500.0000 mg | ORAL_TABLET | Freq: Three times a day (TID) | ORAL | 0 refills | Status: DC
Start: 2018-01-18 — End: 2018-01-18

## 2018-01-18 MED ORDER — VALACYCLOVIR HCL 500 MG PO TABS: ORAL_TABLET | ORAL | 0 refills | Status: DC

## 2018-01-18 NOTE — Progress Notes (Signed)
Chief Complaint  Patient presents with  . Herpes Zoster   Follow up  1. Pt reports last Thursday, Friday, Sunday she had left lateral upper back, left flank burning sensation that was constant and yesterday and today it has been on and off. 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 She does report relative with shingles 1-2 weeks ago who she was exposed to.   2. Left lower leg bruising is some better per comparison photo pt showed me. She is on aspirin 81 mg qd and plavix 75 mg qd (of note since CAD s/p stents x 2 in 2017) but denies h/o trauma to leg   3. H/o Crohns saw [facility] recently and they want to schedule colonoscopy per her per review of note    Review of Systems  Constitutional: Negative for weight loss.  HENT: Negative for hearing loss.   Eyes: Negative for blurred vision.  Respiratory: Negative for shortness of breath.   Cardiovascular: Negative for chest pain.  Skin: Negative for rash.       +burning skin  Endo/Heme/Allergies: Bruises/bleeds easily.   Past Medical History:  Diagnosis Date  . Allergy   . Anxiety   . Chicken pox   . Complication of anesthesia    vomitting  . Coronary artery disease   . Crohn disease ([place])   . DVT (deep venous thrombosis) ([place]) 2017  . Dyspnea    on excertion  . Dysrhythmia 2019   palpitations  . GERD (gastroesophageal reflux disease)   . Heart disease   . Heart murmur   . Heart murmur   . Hyperlipidemia   . Hypertension   . Inflammatory bowel disease   . Migraines    hormonal, puberty  . Myocardial infarction ([place])   . Nephrolithiasis   . Phlebitis    Past Surgical History:  Procedure Laterality Date  . BREAST CYST ASPIRATION     unsure of side  . CARPAL TUNNEL RELEASE Right 12/02/2017   Procedure: CARPAL TUNNEL RELEASE ENDOSCOPIC;  Surgeon: Corky Mull, MD;  Location: [facility]  ORS;  Service: Orthopedics;  Laterality: Right;  . CORONARY ANGIOPLASTY WITH STENT PLACEMENT  2017   x 2  . EYE SURGERY Bilateral    cataract surgery  . GANGLION CYST EXCISION Right 12/02/2017   Procedure: REMOVAL GANGLION OF WRIST;  Surgeon: Corky Mull, MD;  Location: [facility] ORS;  Service: Orthopedics;  Laterality: Right;  . heart murmur    . [place]  . TUBAL LIGATION  1993  . tubaligation  1990   Family History  Problem Relation Age of Onset  . Heart disease Father   . Heart disease Brother   . Cancer Maternal Aunt   . Breast cancer Maternal Aunt   . Stroke Mother   . Kidney disease Neg Hx   . GU problems Neg Hx   . Kidney cancer Neg Hx    Social History   Socioeconomic History  . Marital status: Single    Spouse name: Not on file  . Number of children: Not on file  . Years of education: Not on file  . Highest education level: Not on file  Social Needs  . Financial resource strain: Not on file  . Food insecurity - worry: Not on file  . Food insecurity - inability: Not on file  .  Transportation needs - medical: Not on file  . Transportation needs - non-medical: Not on file  Occupational History  . Not on file  Tobacco Use  . Smoking status: Never Smoker  . Smokeless tobacco: Never Used  Substance and Sexual Activity  . Alcohol use: No    Alcohol/week: 0.0 oz  . Drug use: No  . Sexual activity: Not on file  Other Topics Concern  . Not on file  Social History Narrative   Had 2 kids. Daughter died in 01/20/12 son living    Former Chartered loss adjuster    Current Meds  Medication Sig  . aspirin EC 81 MG tablet Take 81 mg by mouth daily.  . carvedilol (COREG) 3.125 MG tablet Take 1.5625 mg by mouth 2 (two) times daily with a meal.   . clopidogrel (PLAVIX) 75 MG tablet Take 75 mg by mouth daily.  Marland Kitchen EPINEPHrine 0.3 mg/0.3 mL IJ SOAJ injection Inject 0.3 mg into the muscle once.   . escitalopram (LEXAPRO) 20 MG tablet TAKE 1 TABLET BY MOUTH  ONCE DAILY.  Marland Kitchen HYDROcodone-acetaminophen (NORCO/VICODIN) 5-325 MG tablet Take 1-2 tablets by mouth every 6 (six) hours as needed for moderate pain.  . Melatonin 3-10 MG TABS Take 3 mg by mouth at bedtime.  . mesalamine (LIALDA) 1.2 g EC tablet Take 1.2 g by mouth 2 (two) times daily.   . nitroGLYCERIN (NITROSTAT) 0.4 MG SL tablet Place 0.4 mg under the tongue every 5 (five) minutes as needed for chest pain.  . prednisoLONE acetate (PRED FORTE) 1 % ophthalmic suspension Place 1 drop into both eyes 2 (two) times daily as needed (for inflammation).  . ranitidine (ZANTAC) 150 MG tablet Take 1 tablet (150 mg total) by mouth 2 (two) times daily.  . rizatriptan (MAXALT) 5 MG tablet USE AS DIRECTED (Patient taking differently: Take 5 mg by mouth as needed for migraine)  . rosuvastatin (CRESTOR) 5 MG tablet TAKE 1 TABLET BY MOUTH ONCE DAILY.  . verapamil (CALAN-SR) 120 MG CR tablet Take 120 mg by mouth at bedtime.   Allergies  Allergen Reactions  . Ciprofloxacin Anaphylaxis  . Keflex [Cephalexin] Anaphylaxis  . Lisinopril Anaphylaxis  . Meclizine Anaphylaxis  . Sulfa Antibiotics Anaphylaxis  . Tessalon [Benzonatate] Anaphylaxis  . Monurol [Fosfomycin Tromethamine] Rash    Splotchy rash all over body. Breathing became difficult/tightened. Took benadryl immediately so she did not swell   . Codeine Nausea And Vomiting   Recent Results (from the past 19-Jan-2159 hour(s))  CBC     Status: Abnormal   Collection Time: 11/19/17 12:17 PM  Result Value Ref Range   WBC 8.1 3.6 - 11.0 K/uL   RBC 4.08 3.80 - 5.20 MIL/uL   Hemoglobin 11.9 (L) 12.0 - 16.0 g/dL   HCT 36.0 35.0 - 47.0 %   MCV 88.1 80.0 - 100.0 fL   MCH 29.2 26.0 - 34.0 pg   MCHC 33.1 32.0 - 36.0 g/dL   RDW 15.2 (H) 11.5 - 14.5 %   Platelets 327 150 - 440 K/uL    Comment: Performed at [facility], [address], [place], [place] [address]  Basic metabolic panel     Status: None   Collection Time: 11/19/17 12:17 PM  Result Value Ref  Range   Sodium 138 135 - 145 mmol/L   Potassium 4.1 3.5 - 5.1 mmol/L   Chloride 104 101 - 111 mmol/L   CO2 25 22 - 32 mmol/L   Glucose, Bld 92 65 - 99 mg/dL  BUN 19 6 - 20 mg/dL   Creatinine, Ser 0.87 0.44 - 1.00 mg/dL   Calcium 9.1 8.9 - 10.3 mg/dL   GFR calc non Af Amer >60 >60 mL/min   GFR calc Af Amer >60 >60 mL/min    Comment: (NOTE) The eGFR has been calculated using the CKD EPI equation. This calculation has not been validated in all clinical situations. eGFR's persistently <60 mL/min signify possible Chronic Kidney Disease.    Anion gap 9 5 - 15    Comment: Performed at [facility], [address] [place], [place] [address]  Surgical pathology     Status: None   Collection Time: 12/02/17  2:34 PM  Result Value Ref Range   SURGICAL PATHOLOGY      Surgical Pathology CASE: [id number] PATIENT: Oswaldo Done Surgical Pathology Report     SPECIMEN SUBMITTED: A. Tendon, tenosynovium flexor, right wrist  CLINICAL HISTORY: None provided  PRE-OPERATIVE DIAGNOSIS: Ganglion of right wrist, carpal tunnel syndrome  POST-OPERATIVE DIAGNOSIS: None provided.     DIAGNOSIS: A.  TENDON, TENOSYNOVIUM FLEXOR, RIGHT WRIST; EXCISION: - TENDON WITH FOCAL CHANGES CONSISTENT WITH GANGLION CYST.   GROSS DESCRIPTION:  A. Labeled: tenosynovium flexor tendon right wrist  Tissue fragment(s): 3  Size: aggregate, 3.2 x 1.4 x 0.2 cm  Description: in formalin, yellow to tan soft tissue fragments  Entirely submitted in 1 cassette(s).          Final Diagnosis performed by Delorse Lek, MD.  Electronically signed 12/06/2017 4:45:29PM    The electronic signature indicates that the named Attending Pathologist has evaluated the specimen  Technical component performed at [facility], [address] [place], [place]  [address] Lab: [phone] Dir: Rush Farmer, MD, MMM  Professional component performed at [facility], [facility], [place], [place], [place] [address] Lab: [phone] Dir: Dellia Nims. Reuel Derby, MD    Basic metabolic panel     Status: None   Collection Time: 12/17/17  8:57 AM  Result Value Ref Range   Sodium 140 135 - 145 mEq/L   Potassium 3.8 3.5 - 5.1 mEq/L   Chloride 105 96 - 112 mEq/L   CO2 29 19 - 32 mEq/L   Glucose, Bld 98 70 - 99 mg/dL   BUN 20 6 - 23 mg/dL   Creatinine, Ser 0.98 0.40 - 1.20 mg/dL   Calcium 9.3 8.4 - 10.5 mg/dL   GFR 60.95 >60.00 mL/min  Lipid panel     Status: Abnormal   Collection Time: 12/17/17  8:57 AM  Result Value Ref Range   Cholesterol 179 0 - 200 mg/dL    Comment: ATP III Classification       Desirable:  < 200 mg/dL               Borderline High:  200 - 239 mg/dL          High:  > = 240 mg/dL   Triglycerides 105.0 0.0 - 149.0 mg/dL    Comment: Normal:  <150 mg/dLBorderline High:  150 - 199 mg/dL   HDL 43.80 >39.00 mg/dL   VLDL 21.0 0.0 - 40.0 mg/dL   LDL Cholesterol 114 (H) 0 - 99 mg/dL   Total CHOL/HDL Ratio 4     Comment:                Men          Women1/2 Average Risk     3.4          3.3Average  Risk          5.0          4.42X Average Risk          9.6          7.13X Average Risk          15.0          11.0                       NonHDL 135.24     Comment: NOTE:  Non-HDL goal should be 30 mg/dL higher than patient's LDL goal (i.e. LDL goal of < 70 mg/dL, would have non-HDL goal of < 100 mg/dL)  Hepatic function panel     Status: None   Collection Time: 12/17/17  8:57 AM  Result Value Ref Range   Total Bilirubin 0.6 0.2 - 1.2 mg/dL   Bilirubin, Direct 0.1 0.0 - 0.3 mg/dL   Alkaline Phosphatase 88 39 - 117 U/L   AST 17 0 - 37 U/L   ALT 21 0 - 35 U/L   Total Protein 7.1 6.0 - 8.3 g/dL   Albumin 4.1 3.5 - 5.2 g/dL  CBC with Differential/Platelet     Status: Abnormal   Collection Time: 12/23/17  2:19 PM  Result Value Ref Range   WBC 7.8 4.0 - 10.5 K/uL   RBC 3.97 3.87 - 5.11 Mil/uL   Hemoglobin 11.7 (L) 12.0 - 15.0 g/dL   HCT 35.0 (L) 36.0 - 46.0 %    MCV 88.1 78.0 - 100.0 fl   MCHC 33.3 30.0 - 36.0 g/dL   RDW 15.8 (H) 11.5 - 15.5 %   Platelets 338.0 150.0 - 400.0 K/uL   Neutrophils Relative % 61.6 43.0 - 77.0 %   Lymphocytes Relative 25.3 12.0 - 46.0 %   Monocytes Relative 8.9 3.0 - 12.0 %   Eosinophils Relative 3.7 0.0 - 5.0 %   Basophils Relative 0.5 0.0 - 3.0 %   Neutro Abs 4.8 1.4 - 7.7 K/uL   Lymphs Abs 2.0 0.7 - 4.0 K/uL   Monocytes Absolute 0.7 0.1 - 1.0 K/uL   Eosinophils Absolute 0.3 0.0 - 0.7 K/uL   Basophils Absolute 0.0 0.0 - 0.1 K/uL  Ferritin     Status: Abnormal   Collection Time: 12/23/17  2:19 PM  Result Value Ref Range   Ferritin 8.6 (L) 10.0 - 291.0 ng/mL  Protime-INR     Status: None   Collection Time: 12/23/17  2:19 PM  Result Value Ref Range   INR 1.0 0.8 - 1.0 ratio   Prothrombin Time 11.3 9.6 - 13.1 sec  Vitamin B12     Status: None   Collection Time: 12/23/17  2:19 PM  Result Value Ref Range   Vitamin B-12 393 211 - 911 pg/mL  PTT     Status: None   Collection Time: 12/23/17  2:19 PM  Result Value Ref Range   aPTT 27.7 23.4 - 32.7 SEC   Objective  Body mass index is 29.97 kg/m. Wt Readings from Last 3 Encounters:  01/18/18 169 lb 3.2 oz (76.7 kg)  12/23/17 168 lb 3.2 oz (76.3 kg)  11/19/17 167 lb (75.8 kg)   Temp Readings from Last 3 Encounters:  01/18/18 98.2 F (36.8 C) (Oral)  12/23/17 98.5 F (36.9 C) (Oral)  12/02/17 98 F (36.7 C) (Temporal)   BP Readings from Last 3 Encounters:  01/18/18 136/68  12/23/17 128/70  12/02/17 134/64  Pulse Readings from Last 3 Encounters:  01/18/18 76  12/23/17 82  12/02/17 64   O2 sat room air 92%  Physical Exam  Constitutional: She is oriented to person, place, and time and well-developed, well-nourished, and in no distress. Vital signs are normal.  HENT:  Head: Normocephalic and atraumatic.  Mouth/Throat: Oropharynx is clear and moist and mucous membranes are normal.  Eyes: Conjunctivae are normal. Pupils are equal, round, and reactive  to light.  Cardiovascular: Normal rate, regular rhythm and normal heart sounds.  Pulmonary/Chest: Effort normal and breath sounds normal.  Neurological: She is alert and oriented to person, place, and time. Gait normal. Gait normal.  Skin: Skin is warm and dry. No rash noted.     Psychiatric: Mood, memory, affect and judgment normal.  Nursing note and vitals reviewed.   Assessment   1.concerned with shingles due to burning sensation of skin though no rash as of yet  2. H/o vit D def  3. Left lower leg bruising improved though still present  4. H/o crohns  5. H/o CAD s/p stent x 2 in 2017  Plan  1.  Pt c/w taking medications due to allergy history advised she can try 500 mg valtrex to see if reacts but dose would be 1000 mg (2 pills) tid x 7 days  F/u PCP in 2 weeks  2. rec at some pt check vit D she has h/o osteoporosis and low vit D though do no see recent check  3. Monitor and f/u PCP in 2 weeks  4. [facility] wants to sch upcoming colonoscopy for pt though she is c/w having procedure due to not wanting to miss doses of blood thinners. Today I disc colon cancer surveillance rec with IBD and would rec she pursue colonoscopy  Cardiology will need to clear  5. On aspirin 81 and plavix 75 mg qd  Provider: Dr. Olivia Mackie McLean-Scocuzza-Internal Medicine

## 2018-01-18 NOTE — Patient Instructions (Addendum)
Follow up in 2 weeks with PCP   Shingles Shingles, which is also known as herpes zoster, is an infection that causes a painful skin rash and fluid-filled blisters. Shingles is not related to genital herpes, which is a sexually transmitted infection. Shingles only develops in people who:  Have had chickenpox.  Have received the chickenpox vaccine. (This is rare.)  What are the causes? Shingles is caused by varicella-zoster virus (VZV). This is the same virus that causes chickenpox. After exposure to VZV, the virus stays in the body in an inactive (dormant) state. Shingles develops if the virus reactivates. This can happen many years after the initial exposure to VZV. It is not known what causes this virus to reactivate. What increases the risk? People who have had chickenpox or received the chickenpox vaccine are at risk for shingles. Infection is more common in people who:  Are older than age 37.  Have a weakened defense (immune) system, such as those with HIV, AIDS, or cancer.  Are taking medicines that weaken the immune system, such as transplant medicines.  Are under great stress.  What are the signs or symptoms? Early symptoms of this condition include itching, tingling, and pain in an area on your skin. Pain may be described as burning, stabbing, or throbbing. A few days or weeks after symptoms start, a painful red rash appears, usually on one side of the body in a bandlike or beltlike pattern. The rash eventually turns into fluid-filled blisters that break open, scab over, and dry up in about 2-3 weeks. At any time during the infection, you may also develop:  A fever.  Chills.  A headache.  An upset stomach.  How is this diagnosed? This condition is diagnosed with a skin exam. Sometimes, skin or fluid samples are taken from the blisters before a diagnosis is made. These samples are examined under a microscope or sent to a lab for testing. How is this treated? There is no  specific cure for this condition. Your health care provider will probably prescribe medicines to help you manage pain, recover more quickly, and avoid long-term problems. Medicines may include:  Antiviral drugs.  Anti-inflammatory drugs.  Pain medicines.  If the area involved is on your face, you may be referred to a specialist, such as an eye doctor (ophthalmologist) or an ear, nose, and throat (ENT) doctor to help you avoid eye problems, chronic pain, or disability. Follow these instructions at home: Medicines  Take medicines only as directed by your health care provider.  Apply an anti-itch or numbing cream to the affected area as directed by your health care provider. Blister and Rash Care  Take a cool bath or apply cool compresses to the area of the rash or blisters as directed by your health care provider. This may help with pain and itching.  Keep your rash covered with a loose bandage (dressing). Wear loose-fitting clothing to help ease the pain of material rubbing against the rash.  Keep your rash and blisters clean with mild soap and cool water or as directed by your health care provider.  Check your rash every day for signs of infection. These include redness, swelling, and pain that lasts or increases.  Do not pick your blisters.  Do not scratch your rash. General instructions  Rest as directed by your health care provider.  Keep all follow-up visits as directed by your health care provider. This is important.  Until your blisters scab over, your infection can cause chickenpox in  people who have never had it or been vaccinated against it. To prevent this from happening, avoid contact with other people, especially: ? Babies. ? Pregnant women. ? Children who have eczema. ? Elderly people who have transplants. ? People who have chronic illnesses, such as leukemia or AIDS. Contact a health care provider if:  Your pain is not relieved with prescribed  medicines.  Your pain does not get better after the rash heals.  Your rash looks infected. Signs of infection include redness, swelling, and pain that lasts or increases. Get help right away if:  The rash is on your face or nose.  You have facial pain, pain around your eye area, or loss of feeling on one side of your face.  You have ear pain or you have ringing in your ear.  You have loss of taste.  Your condition gets worse. This information is not intended to replace advice given to you by your health care provider. Make sure you discuss any questions you have with your health care provider. Document Released: 10/26/2005 Document Revised: 06/21/2016 Document Reviewed: 09/06/2014 Elsevier Interactive Patient Education  2018 [employer].

## 2018-01-18 NOTE — Progress Notes (Signed)
Pre visit review using our clinic review tool, if applicable. No additional management support is needed unless otherwise documented below in the visit note. 

## 2018-01-19 ENCOUNTER — Telehealth: Payer: Self-pay

## 2018-01-19 NOTE — Telephone Encounter (Signed)
Copied from [place] [id number]. Topic: General - Other >> Jan 19, 2018  9:23 AM Carolyn Stare wrote:  Pt call to say she think the doctor told her to take 2 pills 3 times a day but when she picked up her rx that is not what it said. She would like to know what is the correct way. Call back number [phone]

## 2018-01-19 NOTE — Telephone Encounter (Signed)
Patient called back and advised of below, she states she tolerated 1 pill and will precede as follows   Valtrex 500 mg x 1 pill and tolerated the dose would be 2 pills 3x per day x 1 week the Rx I sent in says different but she should have enough pills to do this

## 2018-01-19 NOTE — Telephone Encounter (Signed)
Copied from [place] [id number]. Topic: Referral - Question >> Jan 19, 2018 11:06 AM Vernona Rieger wrote:   Ander Purpura from [facility] called and said they need all labs that patient had done sent to them.  Fax number [phone] Any questions please call [phone]

## 2018-01-19 NOTE — Telephone Encounter (Signed)
Please fax labs from 12/2017 to numbers below   [place]

## 2018-01-20 NOTE — Telephone Encounter (Signed)
Routed labs x2. Lauren still has not received but she says she can see labs in Care Everywhere. Advised Lauren to call me back if she does not receive the labs that I faxed.

## 2018-01-20 NOTE — Telephone Encounter (Signed)
Lauren from [facility] called to say the fax she received did not include Lab results, she wanted to speak with someone at office.  Lauren states its urgent.  Her contact number is [phone]  Fax [phone]

## 2018-01-24 ENCOUNTER — Encounter: Payer: Self-pay | Admitting: Internal Medicine

## 2018-02-04 ENCOUNTER — Ambulatory Visit: Payer: BC Managed Care – PPO | Admitting: Internal Medicine

## 2018-02-07 ENCOUNTER — Encounter: Payer: Self-pay | Admitting: Internal Medicine

## 2018-02-07 ENCOUNTER — Ambulatory Visit (INDEPENDENT_AMBULATORY_CARE_PROVIDER_SITE_OTHER): Payer: BC Managed Care – PPO | Admitting: Internal Medicine

## 2018-02-07 VITALS — BP 128/72 | HR 64 | Temp 98.1°F | Resp 16 | Wt 168.5 lb

## 2018-02-07 DIAGNOSIS — I2510 Atherosclerotic heart disease of native coronary artery without angina pectoris: Secondary | ICD-10-CM

## 2018-02-07 DIAGNOSIS — B029 Zoster without complications: Secondary | ICD-10-CM | POA: Diagnosis not present

## 2018-02-07 DIAGNOSIS — F32A Depression, unspecified: Secondary | ICD-10-CM

## 2018-02-07 DIAGNOSIS — D509 Iron deficiency anemia, unspecified: Secondary | ICD-10-CM

## 2018-02-07 DIAGNOSIS — I10 Essential (primary) hypertension: Secondary | ICD-10-CM | POA: Diagnosis not present

## 2018-02-07 DIAGNOSIS — F329 Major depressive disorder, single episode, unspecified: Secondary | ICD-10-CM

## 2018-02-07 DIAGNOSIS — E7800 Pure hypercholesterolemia, unspecified: Secondary | ICD-10-CM

## 2018-02-07 NOTE — Progress Notes (Signed)
Patient ID: Morgan Moore, female   DOB: 21-Jun-1956, 62 y.o.   MRN: [id number]   Subjective:    Patient ID: Morgan Moore, female    DOB: November 26, 1955, 62 y.o.   MRN: [id number]  HPI  Patient here as a work in to follow up regarding recent diagnosis of shingles.  Was seen 01/18/18.  Diagnosed with shingles.  Placed on valtrex.  Never had rash.  States she is doing better.  Feels better. No significant pain now.  No rash.  Increased stress.  Her nephew and his son were in a plane crash recently.  They both survived.  Her nephew is still in the hospital.  Discussed with her today.  States she has good support.  Does not feel she needs anything more at this time.  Trying to stay active.  No chest pain.  No sob.  No acid reflux.  Had nose bleed recently.  Discussed if a persistent issue, would need ENT evaluation.  She sees Dr Kathyrn Sheriff and states she will call if needs to.     Past Medical History:  Diagnosis Date  . Allergy   . Anxiety   . Chicken pox   . Complication of anesthesia    vomitting  . Coronary artery disease   . Crohn disease ([place])   . DVT (deep venous thrombosis) ([place]) 2017  . Dyspnea    on excertion  . Dysrhythmia 2019   palpitations  . GERD (gastroesophageal reflux disease)   . Heart disease   . Heart murmur   . Heart murmur   . Hyperlipidemia   . Hypertension   . Inflammatory bowel disease   . Migraines    hormonal, puberty  . Myocardial infarction ([place])   . Nephrolithiasis   . Phlebitis    Past Surgical History:  Procedure Laterality Date  . BREAST CYST ASPIRATION     unsure of side  . CARPAL TUNNEL RELEASE Right 12/02/2017   Procedure: CARPAL TUNNEL RELEASE ENDOSCOPIC;  Surgeon: Corky Mull, MD;  Location: [facility] ORS;  Service: Orthopedics;  Laterality: Right;  . CORONARY ANGIOPLASTY WITH STENT PLACEMENT  2017   x 2  . EYE SURGERY Bilateral    cataract surgery  . GANGLION CYST EXCISION Right 12/02/2017   Procedure: REMOVAL GANGLION OF WRIST;   Surgeon: Corky Mull, MD;  Location: [facility] ORS;  Service: Orthopedics;  Laterality: Right;  . heart murmur    . [place]  . TUBAL LIGATION  1993  . tubaligation  1990   Family History  Problem Relation Age of Onset  . Heart disease Father   . Heart disease Brother   . Cancer Maternal Aunt   . Breast cancer Maternal Aunt   . Stroke Mother   . Kidney disease Neg Hx   . GU problems Neg Hx   . Kidney cancer Neg Hx    Social History   Socioeconomic History  . Marital status: Single    Spouse name: Not on file  . Number of children: Not on file  . Years of education: Not on file  . Highest education level: Not on file  Occupational History  . Not on file  Social Needs  . Financial resource strain: Not on file  . Food insecurity:    Worry: Not on file    Inability: Not on file  . Transportation needs:    Medical: Not on file    Non-medical: Not on file  Tobacco Use  .  Smoking status: Never Smoker  . Smokeless tobacco: Never Used  Substance and Sexual Activity  . Alcohol use: No    Alcohol/week: 0.0 oz  . Drug use: No  . Sexual activity: Not on file  Lifestyle  . Physical activity:    Days per week: Not on file    Minutes per session: Not on file  . Stress: Not on file  Relationships  . Social connections:    Talks on phone: Not on file    Gets together: Not on file    Attends religious service: Not on file    Active member of club or organization: Not on file    Attends meetings of clubs or organizations: Not on file    Relationship status: Not on file  Other Topics Concern  . Not on file  Social History Narrative   Had 2 kids. Daughter died in January 25, 2012 son living    Former [employer] loss adjuster     Outpatient Encounter Medications as of 02/07/2018  Medication Sig  . aspirin EC 81 MG tablet Take 81 mg by mouth daily.  . carvedilol (COREG) 3.125 MG tablet Take 1.5625 mg by mouth 2 (two) times daily with a meal.   . clopidogrel (PLAVIX)  75 MG tablet Take 75 mg by mouth daily.  Marland Kitchen EPINEPHrine 0.3 mg/0.3 mL IJ SOAJ injection Inject 0.3 mg into the muscle once.   . escitalopram (LEXAPRO) 20 MG tablet TAKE 1 TABLET BY MOUTH ONCE DAILY.  Marland Kitchen HYDROcodone-acetaminophen (NORCO/VICODIN) 5-325 MG tablet Take 1-2 tablets by mouth every 6 (six) hours as needed for moderate pain.  . Melatonin 3-10 MG TABS Take 3 mg by mouth at bedtime.  . mesalamine (LIALDA) 1.2 g EC tablet Take 1.2 g by mouth 2 (two) times daily.   . nitroGLYCERIN (NITROSTAT) 0.4 MG SL tablet Place 0.4 mg under the tongue every 5 (five) minutes as needed for chest pain.  . prednisoLONE acetate (PRED FORTE) 1 % ophthalmic suspension Place 1 drop into both eyes 2 (two) times daily as needed (for inflammation).  . ranitidine (ZANTAC) 150 MG tablet Take 1 tablet (150 mg total) by mouth 2 (two) times daily.  . rizatriptan (MAXALT) 5 MG tablet USE AS DIRECTED (Patient taking differently: Take 5 mg by mouth as needed for migraine)  . rosuvastatin (CRESTOR) 5 MG tablet TAKE 1 TABLET BY MOUTH ONCE DAILY.  . valACYclovir (VALTREX) 500 MG tablet 2 pills tid x 1 week  . verapamil (CALAN-SR) 120 MG CR tablet Take 120 mg by mouth at bedtime.   No facility-administered encounter medications on file as of 02/07/2018.     Review of Systems  Constitutional: Negative for appetite change and unexpected weight change.  HENT: Negative for congestion and sinus pressure.   Respiratory: Negative for cough, chest tightness and shortness of breath.   Cardiovascular: Negative for chest pain, palpitations and leg swelling.  Gastrointestinal: Negative for abdominal pain, diarrhea, nausea and vomiting.  Genitourinary: Negative for difficulty urinating and dysuria.  Musculoskeletal: Negative for joint swelling and myalgias.  Skin: Negative for color change and rash.  Neurological: Negative for dizziness, light-headedness and headaches.  Psychiatric/Behavioral: Negative for agitation and dysphoric mood.         Objective:    Physical Exam  Constitutional: She appears well-developed and well-nourished. No distress.  HENT:  Nose: Nose normal.  Mouth/Throat: Oropharynx is clear and moist.  Neck: Neck supple. No thyromegaly present.  Cardiovascular: Normal rate and regular rhythm.  Pulmonary/Chest: Breath sounds normal. No  respiratory distress. She has no wheezes.  Abdominal: Soft. Bowel sounds are normal. There is no tenderness.  Musculoskeletal: She exhibits no edema or tenderness.  Lymphadenopathy:    She has no cervical adenopathy.  Skin: No rash noted. No erythema.  Psychiatric: She has a normal mood and affect. Her behavior is normal.    BP 128/72 (BP Location: Left Arm, Patient Position: Sitting, Cuff Size: Normal)   Pulse 64   Temp 98.1 F (36.7 C) (Oral)   Resp 16   Wt 168 lb 8 oz (76.4 kg)   SpO2 98%   BMI 29.85 kg/m  Wt Readings from Last 3 Encounters:  02/07/18 168 lb 8 oz (76.4 kg)  01/18/18 169 lb 3.2 oz (76.7 kg)  12/23/17 168 lb 3.2 oz (76.3 kg)     Lab Results  Component Value Date   WBC 7.8 12/23/2017   HGB 11.7 (L) 12/23/2017   HCT 35.0 (L) 12/23/2017   PLT 338.0 12/23/2017   GLUCOSE 98 12/17/2017   CHOL 179 12/17/2017   TRIG 105.0 12/17/2017   HDL 43.80 12/17/2017   LDLCALC 114 (H) 12/17/2017   ALT 21 12/17/2017   AST 17 12/17/2017   NA 140 12/17/2017   K 3.8 12/17/2017   CL 105 12/17/2017   CREATININE 0.98 12/17/2017   BUN 20 12/17/2017   CO2 29 12/17/2017   TSH 4.03 08/12/2017   INR 1.0 12/23/2017       Assessment & Plan:   Problem List Items Addressed This Visit    Anemia    Follow cbc.       Relevant Orders   CBC with Differential/Platelet   Ferritin   CAD (coronary artery disease)    Followed by cardiology.  Currently doing well.  Continue risk factor modification.        Depression    Doing well on current regimen.  Recent stress as outlined.  Discussed with her today.  She does not feel needs anything more.  Has good  support.  Follow.       Essential hypertension    Blood pressure as outlined.  Follow pressures.  Same medication regimen.  Follow metabolic panel.       Relevant Orders   Basic metabolic panel   Hypercholesterolemia    On crestor.  Low cholesterol diet and exercise.  Follow lipid panel and liver function tests.        Relevant Orders   Hepatic function panel   Lipid panel    Other Visit Diagnoses    Herpes zoster without complication    -  Primary   was diagnosed recently.  no rash.  no significant pain now.  follow.        Einar Pheasant, MD

## 2018-02-14 ENCOUNTER — Encounter: Payer: Self-pay | Admitting: Internal Medicine

## 2018-02-14 NOTE — Assessment & Plan Note (Signed)
Follow cbc.  

## 2018-02-14 NOTE — Assessment & Plan Note (Signed)
Doing well on current regimen.  Recent stress as outlined.  Discussed with her today.  She does not feel needs anything more.  Has good support.  Follow.

## 2018-02-14 NOTE — Assessment & Plan Note (Signed)
Blood pressure as outlined.  Follow pressures.  Same medication regimen.  Follow metabolic panel.

## 2018-02-14 NOTE — Assessment & Plan Note (Signed)
On crestor.  Low cholesterol diet and exercise.  Follow lipid panel and liver function tests.

## 2018-02-14 NOTE — Assessment & Plan Note (Signed)
Followed by cardiology.  Currently doing well.  Continue risk factor modification.

## 2018-03-23 ENCOUNTER — Other Ambulatory Visit: Payer: Self-pay | Admitting: Internal Medicine

## 2018-03-30 ENCOUNTER — Other Ambulatory Visit: Payer: Self-pay | Admitting: Internal Medicine

## 2018-03-30 DIAGNOSIS — Z1231 Encounter for screening mammogram for malignant neoplasm of breast: Secondary | ICD-10-CM

## 2018-04-11 ENCOUNTER — Other Ambulatory Visit (INDEPENDENT_AMBULATORY_CARE_PROVIDER_SITE_OTHER): Payer: BC Managed Care – PPO

## 2018-04-11 DIAGNOSIS — D509 Iron deficiency anemia, unspecified: Secondary | ICD-10-CM

## 2018-04-11 DIAGNOSIS — E7800 Pure hypercholesterolemia, unspecified: Secondary | ICD-10-CM | POA: Diagnosis not present

## 2018-04-11 DIAGNOSIS — I10 Essential (primary) hypertension: Secondary | ICD-10-CM

## 2018-04-11 LAB — CBC WITH DIFFERENTIAL/PLATELET
BASOS ABS: 0.1 10*3/uL (ref 0.0–0.1)
Basophils Relative: 0.8 % (ref 0.0–3.0)
Eosinophils Absolute: 0.4 10*3/uL (ref 0.0–0.7)
Eosinophils Relative: 4.4 % (ref 0.0–5.0)
HCT: 38.8 % (ref 36.0–46.0)
Hemoglobin: 13.2 g/dL (ref 12.0–15.0)
LYMPHS ABS: 1.5 10*3/uL (ref 0.7–4.0)
LYMPHS PCT: 18.4 % (ref 12.0–46.0)
MCHC: 33.9 g/dL (ref 30.0–36.0)
MCV: 93.4 fl (ref 78.0–100.0)
MONOS PCT: 9.5 % (ref 3.0–12.0)
Monocytes Absolute: 0.8 10*3/uL (ref 0.1–1.0)
NEUTROS PCT: 66.9 % (ref 43.0–77.0)
Neutro Abs: 5.5 10*3/uL (ref 1.4–7.7)
Platelets: 301 10*3/uL (ref 150.0–400.0)
RBC: 4.15 Mil/uL (ref 3.87–5.11)
RDW: 15.1 % (ref 11.5–15.5)
WBC: 8.2 10*3/uL (ref 4.0–10.5)

## 2018-04-11 LAB — HEPATIC FUNCTION PANEL
ALBUMIN: 4 g/dL (ref 3.5–5.2)
ALT: 18 U/L (ref 0–35)
AST: 18 U/L (ref 0–37)
Alkaline Phosphatase: 88 U/L (ref 39–117)
Bilirubin, Direct: 0 mg/dL (ref 0.0–0.3)
Total Bilirubin: 0.4 mg/dL (ref 0.2–1.2)
Total Protein: 6.8 g/dL (ref 6.0–8.3)

## 2018-04-11 LAB — BASIC METABOLIC PANEL
BUN: 14 mg/dL (ref 6–23)
CHLORIDE: 104 meq/L (ref 96–112)
CO2: 29 meq/L (ref 19–32)
Calcium: 9.8 mg/dL (ref 8.4–10.5)
Creatinine, Ser: 0.87 mg/dL (ref 0.40–1.20)
GFR: 69.86 mL/min (ref >60.00)
GLUCOSE: 97 mg/dL (ref 70–99)
POTASSIUM: 4.5 meq/L (ref 3.5–5.1)
SODIUM: 140 meq/L (ref 135–145)

## 2018-04-11 LAB — LIPID PANEL
CHOL/HDL RATIO: 4
Cholesterol: 177 mg/dL (ref 0–200)
HDL: 45 mg/dL (ref >39.00)
LDL Cholesterol: 100 mg/dL — ABNORMAL HIGH (ref 0–99)
NONHDL: 132.21
Triglycerides: 161 mg/dL — ABNORMAL HIGH (ref 0.0–149.0)
VLDL: 32.2 mg/dL (ref 0.0–40.0)

## 2018-04-11 LAB — FERRITIN: FERRITIN: 14.1 ng/mL (ref 10.0–291.0)

## 2018-04-14 ENCOUNTER — Encounter: Payer: Self-pay | Admitting: Internal Medicine

## 2018-04-14 ENCOUNTER — Ambulatory Visit (INDEPENDENT_AMBULATORY_CARE_PROVIDER_SITE_OTHER): Payer: BC Managed Care – PPO | Admitting: Internal Medicine

## 2018-04-14 DIAGNOSIS — F439 Reaction to severe stress, unspecified: Secondary | ICD-10-CM | POA: Diagnosis not present

## 2018-04-14 DIAGNOSIS — D509 Iron deficiency anemia, unspecified: Secondary | ICD-10-CM | POA: Diagnosis not present

## 2018-04-14 DIAGNOSIS — F329 Major depressive disorder, single episode, unspecified: Secondary | ICD-10-CM

## 2018-04-14 DIAGNOSIS — E559 Vitamin D deficiency, unspecified: Secondary | ICD-10-CM

## 2018-04-14 DIAGNOSIS — K50919 Crohn's disease, unspecified, with unspecified complications: Secondary | ICD-10-CM | POA: Diagnosis not present

## 2018-04-14 DIAGNOSIS — I2510 Atherosclerotic heart disease of native coronary artery without angina pectoris: Secondary | ICD-10-CM | POA: Diagnosis not present

## 2018-04-14 DIAGNOSIS — I10 Essential (primary) hypertension: Secondary | ICD-10-CM

## 2018-04-14 DIAGNOSIS — F32A Depression, unspecified: Secondary | ICD-10-CM

## 2018-04-14 DIAGNOSIS — E7800 Pure hypercholesterolemia, unspecified: Secondary | ICD-10-CM

## 2018-04-14 NOTE — Progress Notes (Signed)
Patient ID: Morgan Moore, female   DOB: 10-16-1956, 62 y.o.   MRN: [id number]   Subjective:    Patient ID: Morgan Moore, female    DOB: [birth year], 62 y.o.   MRN: [id number]  HPI  Patient here for a scheduled follow up. She saw GI recently for f/u Crohn's, GERD and for evaluation of IDA.  Recommended colonoscopy and f/u in 6 months.  Overall she feels her colon and GI tract - stable/doing well.  She was also having problems with persistent nose bleeds.  Saw ENT.  Bleeding better.  No chest pain.  Breathing stable.  No acid reflux.  Handling stress.  Overall she feels she is doing relatively well.     Past Medical History:  Diagnosis Date  . Allergy   . Anxiety   . Chicken pox   . Complication of anesthesia    vomitting  . Coronary artery disease   . Crohn disease ([place])   . DVT (deep venous thrombosis) ([place]) 2017  . Dyspnea    on excertion  . Dysrhythmia 2019   palpitations  . GERD (gastroesophageal reflux disease)   . Heart disease   . Heart murmur   . Heart murmur   . Hyperlipidemia   . Hypertension   . Inflammatory bowel disease   . Migraines    hormonal, puberty  . Myocardial infarction ([place])   . Nephrolithiasis   . Phlebitis    Past Surgical History:  Procedure Laterality Date  . BREAST CYST ASPIRATION     unsure of side  . CARPAL TUNNEL RELEASE Right 12/02/2017   Procedure: CARPAL TUNNEL RELEASE ENDOSCOPIC;  Surgeon: Corky Mull, MD;  Location: [facility] ORS;  Service: Orthopedics;  Laterality: Right;  . CORONARY ANGIOPLASTY WITH STENT PLACEMENT  2017   x 2  . EYE SURGERY Bilateral    cataract surgery  . GANGLION CYST EXCISION Right 12/02/2017   Procedure: REMOVAL GANGLION OF WRIST;  Surgeon: Corky Mull, MD;  Location: [facility] ORS;  Service: Orthopedics;  Laterality: Right;  . heart murmur    . [place]  . TUBAL LIGATION  1993  . tubaligation  1990   Family History  Problem Relation Age of Onset  . Heart disease Father    . Heart disease Brother   . Cancer Maternal Aunt   . Breast cancer Maternal Aunt   . Stroke Mother   . Kidney disease Neg Hx   . GU problems Neg Hx   . Kidney cancer Neg Hx    Social History   Socioeconomic History  . Marital status: Single    Spouse name: Not on file  . Number of children: Not on file  . Years of education: Not on file  . Highest education level: Not on file  Occupational History  . Not on file  Social Needs  . Financial resource strain: Not on file  . Food insecurity:    Worry: Not on file    Inability: Not on file  . Transportation needs:    Medical: Not on file    Non-medical: Not on file  Tobacco Use  . Smoking status: Never Smoker  . Smokeless tobacco: Never Used  Substance and Sexual Activity  . Alcohol use: No    Alcohol/week: 0.0 oz  . Drug use: No  . Sexual activity: Not on file  Lifestyle  . Physical activity:    Days per week: Not on file    Minutes per session:  Not on file  . Stress: Not on file  Relationships  . Social connections:    Talks on phone: Not on file    Gets together: Not on file    Attends religious service: Not on file    Active member of club or organization: Not on file    Attends meetings of clubs or organizations: Not on file    Relationship status: Not on file  Other Topics Concern  . Not on file  Social History Narrative   Had 2 kids. Daughter died in 01/07/2012 son living    Former Chartered loss adjuster     Outpatient Encounter Medications as of 04/14/2018  Medication Sig  . aspirin EC 81 MG tablet Take 81 mg by mouth daily.  . carvedilol (COREG) 3.125 MG tablet Take 1.5625 mg by mouth 2 (two) times daily with a meal.   . clopidogrel (PLAVIX) 75 MG tablet Take 75 mg by mouth daily.  Marland Kitchen EPINEPHrine 0.3 mg/0.3 mL IJ SOAJ injection Inject 0.3 mg into the muscle once.   . escitalopram (LEXAPRO) 20 MG tablet TAKE 1 TABLET BY MOUTH ONCE DAILY.  . Melatonin 3-10 MG TABS Take 3 mg by mouth at bedtime.  . mesalamine  (LIALDA) 1.2 g EC tablet Take 1.2 g by mouth 2 (two) times daily.   . nitroGLYCERIN (NITROSTAT) 0.4 MG SL tablet Place 0.4 mg under the tongue every 5 (five) minutes as needed for chest pain.  . prednisoLONE acetate (PRED FORTE) 1 % ophthalmic suspension Place 1 drop into both eyes 2 (two) times daily as needed (for inflammation).  . ranitidine (ZANTAC) 150 MG tablet Take 1 tablet (150 mg total) by mouth 2 (two) times daily.  . rizatriptan (MAXALT) 5 MG tablet USE AS DIRECTED (Patient taking differently: Take 5 mg by mouth as needed for migraine)  . rosuvastatin (CRESTOR) 5 MG tablet TAKE 1 TABLET BY MOUTH ONCE DAILY.  . verapamil (CALAN-SR) 120 MG CR tablet Take 120 mg by mouth at bedtime.  . [DISCONTINUED] HYDROcodone-acetaminophen (NORCO/VICODIN) 5-325 MG tablet Take 1-2 tablets by mouth every 6 (six) hours as needed for moderate pain.  . [DISCONTINUED] valACYclovir (VALTREX) 500 MG tablet 2 pills tid x 1 week   No facility-administered encounter medications on file as of 04/14/2018.     Review of Systems  Constitutional: Negative for appetite change and unexpected weight change.  HENT: Negative for congestion and sinus pressure.   Respiratory: Negative for cough and chest tightness.        Breathing stable.   Cardiovascular: Negative for chest pain, palpitations and leg swelling.  Gastrointestinal: Negative for abdominal pain, diarrhea, nausea and vomiting.  Genitourinary: Negative for difficulty urinating and dysuria.  Musculoskeletal: Negative for joint swelling and myalgias.  Skin: Negative for color change and rash.  Neurological: Negative for dizziness, light-headedness and headaches.  Psychiatric/Behavioral: Negative for agitation and dysphoric mood.       Objective:     Blood pressure rechecked by me:  116/68  Physical Exam  Constitutional: She appears well-developed and well-nourished. No distress.  HENT:  Nose: Nose normal.  Mouth/Throat: Oropharynx is clear and moist.    Neck: Neck supple. No thyromegaly present.  Cardiovascular: Normal rate and regular rhythm.  Pulmonary/Chest: Breath sounds normal. No respiratory distress. She has no wheezes.  Abdominal: Soft. Bowel sounds are normal. There is no tenderness.  Musculoskeletal: She exhibits no edema or tenderness.  Lymphadenopathy:    She has no cervical adenopathy.  Skin: No rash noted. No erythema.  Psychiatric: She has a normal mood and affect. Her behavior is normal.    BP 118/72 (BP Location: Left Arm, Patient Position: Sitting, Cuff Size: Normal)   Pulse 64   Temp 98.2 F (36.8 C) (Oral)   Resp 18   Wt 167 lb 6.4 oz (75.9 kg)   SpO2 97%   BMI 29.65 kg/m  Wt Readings from Last 3 Encounters:  04/14/18 167 lb 6.4 oz (75.9 kg)  02/07/18 168 lb 8 oz (76.4 kg)  01/18/18 169 lb 3.2 oz (76.7 kg)     Lab Results  Component Value Date   WBC 8.2 04/11/2018   HGB 13.2 04/11/2018   HCT 38.8 04/11/2018   PLT 301.0 04/11/2018   GLUCOSE 97 04/11/2018   CHOL 177 04/11/2018   TRIG 161.0 (H) 04/11/2018   HDL 45.00 04/11/2018   LDLCALC 100 (H) 04/11/2018   ALT 18 04/11/2018   AST 18 04/11/2018   NA 140 04/11/2018   K 4.5 04/11/2018   CL 104 04/11/2018   CREATININE 0.87 04/11/2018   BUN 14 04/11/2018   CO2 29 04/11/2018   TSH 4.03 08/12/2017   INR 1.0 12/23/2017       Assessment & Plan:   Problem List Items Addressed This Visit    Anemia    Just saw GI.  Follow cbc.  Has f/u 05/2018. Plans to discuss colonoscopy.        Relevant Orders   CBC with Differential/Platelet   Ferritin   CAD (coronary artery disease)    Followed by cardiology.  Currently doing well.  Continue risk factor modification.        Crohn disease ([place])    Just evaluated by GI.  Stable.  Has f/u planned in 05/2018.        Depression    Doing well on current regimen.  Follow.        Essential hypertension    Blood pressure under good control.  Continue same medication regimen.  Follow pressures.  Follow  metabolic panel.        Relevant Orders   TSH   Basic metabolic panel   Hypercholesterolemia    On crestor.  Low cholesterol diet and exercise.  Follow lipid panel and liver function tests.        Relevant Orders   Hepatic function panel   Lipid panel   Stress    Overall she feels she is handling things relatively well.  Does not feel needs any further intervention.  Has good support.  Follow.        Vitamin D deficiency    Follow vitamin D level.        Relevant Orders   VITAMIN D 25 Hydroxy (Vit-D Deficiency, Fractures)       Einar Pheasant, MD

## 2018-04-17 ENCOUNTER — Encounter: Payer: Self-pay | Admitting: Internal Medicine

## 2018-04-17 NOTE — Assessment & Plan Note (Signed)
On crestor.  Low cholesterol diet and exercise.  Follow lipid panel and liver function tests.

## 2018-04-17 NOTE — Assessment & Plan Note (Signed)
Just saw GI.  Follow cbc.  Has f/u 05/2018. Plans to discuss colonoscopy.

## 2018-04-17 NOTE — Assessment & Plan Note (Signed)
Follow vitamin D level.

## 2018-04-17 NOTE — Assessment & Plan Note (Signed)
Blood pressure under good control.  Continue same medication regimen.  Follow pressures.  Follow metabolic panel.   

## 2018-04-17 NOTE — Assessment & Plan Note (Signed)
Followed by cardiology.  Currently doing well.  Continue risk factor modification.

## 2018-04-17 NOTE — Assessment & Plan Note (Signed)
Overall she feels she is handling things relatively well.  Does not feel needs any further intervention.  Has good support.  Follow.

## 2018-04-17 NOTE — Assessment & Plan Note (Signed)
Doing well on current regimen.  Follow.

## 2018-04-17 NOTE — Assessment & Plan Note (Signed)
Just evaluated by GI.  Stable.  Has f/u planned in 05/2018.

## 2018-05-18 ENCOUNTER — Telehealth: Payer: Self-pay

## 2018-05-18 NOTE — Telephone Encounter (Signed)
Called patient and scheduled with NP for Friday , Patient denied any further bleeding has been keeping leg elevated today as advised by [facility] ER [place]. Patient up to date on TD, patient was  Advised to change bandage today, but she is scared to do so due to she is afraid bleeding will start again.Quick Clot was applied with non stick telfa looks like rom note in [facility].

## 2018-05-18 NOTE — Telephone Encounter (Signed)
Copied from [place] [id number]. Topic: Inquiry >> May 18, 2018  1:48 PM Conception Chancy, NT wrote: Reason for CRM: patient is calling and states she punctured her leg yesterday 05/17/18 at 1pm and she is on blood thinners. She states her leg would not stop bleeding and her nephew is a first responder and wrapped it again at 5. She states she bled through it by 9pm so she went to the ER in [place] and they were able to clot it off but is wanting her to do a hospital follow up. Dr. Nicki Reaper schedule is full. Please contact patient to schedule.

## 2018-05-18 NOTE — Telephone Encounter (Signed)
Agree with need for f/u.  Does she need anything more now.  If no, ok for f/u.

## 2018-05-18 NOTE — Telephone Encounter (Signed)
Patient notified she should leave bandage in place until she sees NP on Friday discussed verbally with PCP to also keep clean and dry and use elevation. Patient voiced understanding.

## 2018-05-20 ENCOUNTER — Encounter: Payer: Self-pay | Admitting: Family

## 2018-05-20 ENCOUNTER — Ambulatory Visit: Payer: BC Managed Care – PPO | Admitting: Family

## 2018-05-20 VITALS — BP 138/68 | HR 68 | Temp 98.4°F | Wt 167.2 lb

## 2018-05-20 DIAGNOSIS — S81801D Unspecified open wound, right lower leg, subsequent encounter: Secondary | ICD-10-CM | POA: Diagnosis not present

## 2018-05-20 MED ORDER — CLINDAMYCIN HCL 300 MG PO CAPS: 300.0000 mg | ORAL_CAPSULE | Freq: Three times a day (TID) | ORAL | 0 refills | Status: DC

## 2018-05-20 NOTE — Progress Notes (Signed)
Subjective:    Patient ID: Morgan Moore, female    DOB: 11-13-1955, 62 y.o.   MRN: [id number]  CC: Morgan Moore is a 62 y.o. female who presents today for follow up.   HPI: Right lower leg  Wound for 3 days, improving. Notes she had redness, 'some heat' over wound however this has improved today.  No purulent discharge, streaking.  No calf swelling or calf pain. Bleeding has stopped. Has been wearing dressing at home. NO SOB, Fever, N, V.   Has extensive antibiotic allergies including anaphylaxis, and she is very cautious of antibiotics.   H/o of DVT- on plavix, ASA  Wound to right lower extremity - ED 05/17/18. Xray tib/fib no evidence of foreign object Quik Clot given in ED. Tetanus UTD   HISTORY:  Past Medical History:  Diagnosis Date  . Allergy   . Anxiety   . Chicken pox   . Complication of anesthesia    vomitting  . Coronary artery disease   . Crohn disease ([place])   . DVT (deep venous thrombosis) ([place]) 2017  . Dyspnea    on excertion  . Dysrhythmia 2019   palpitations  . GERD (gastroesophageal reflux disease)   . Heart disease   . Heart murmur   . Heart murmur   . Hyperlipidemia   . Hypertension   . Inflammatory bowel disease   . Migraines    hormonal, puberty  . Myocardial infarction ([place])   . Nephrolithiasis   . Phlebitis    Past Surgical History:  Procedure Laterality Date  . BREAST CYST ASPIRATION     unsure of side  . CARPAL TUNNEL RELEASE Right 12/02/2017   Procedure: CARPAL TUNNEL RELEASE ENDOSCOPIC;  Surgeon: Corky Mull, MD;  Location: [facility] ORS;  Service: Orthopedics;  Laterality: Right;  . CORONARY ANGIOPLASTY WITH STENT PLACEMENT  2017   x 2  . EYE SURGERY Bilateral    cataract surgery  . GANGLION CYST EXCISION Right 12/02/2017   Procedure: REMOVAL GANGLION OF WRIST;  Surgeon: Corky Mull, MD;  Location: [facility] ORS;  Service: Orthopedics;  Laterality: Right;  . heart murmur    . [place]  . TUBAL  LIGATION  1993  . tubaligation  1990   Family History  Problem Relation Age of Onset  . Heart disease Father   . Heart disease Brother   . Cancer Maternal Aunt   . Breast cancer Maternal Aunt   . Stroke Mother   . Kidney disease Neg Hx   . GU problems Neg Hx   . Kidney cancer Neg Hx     Allergies: Ciprofloxacin; Keflex [cephalexin]; Lisinopril; Meclizine; Sulfa antibiotics; Tessalon [benzonatate]; Monurol [fosfomycin tromethamine]; and Codeine Current Outpatient Medications on File Prior to Visit  Medication Sig Dispense Refill  . aspirin EC 81 MG tablet Take 81 mg by mouth daily.    . carvedilol (COREG) 3.125 MG tablet Take 1.5625 mg by mouth 2 (two) times daily with a meal.     . clopidogrel (PLAVIX) 75 MG tablet Take 75 mg by mouth daily.    Marland Kitchen EPINEPHrine 0.3 mg/0.3 mL IJ SOAJ injection Inject 0.3 mg into the muscle once.     . escitalopram (LEXAPRO) 20 MG tablet TAKE 1 TABLET BY MOUTH ONCE DAILY. 90 tablet 1  . Melatonin 3-10 MG TABS Take 3 mg by mouth at bedtime.    . mesalamine (LIALDA) 1.2 g EC tablet Take 1.2 g by mouth 2 (two) times  daily.     . nitroGLYCERIN (NITROSTAT) 0.4 MG SL tablet Place 0.4 mg under the tongue every 5 (five) minutes as needed for chest pain.    . prednisoLONE acetate (PRED FORTE) 1 % ophthalmic suspension Place 1 drop into both eyes 2 (two) times daily as needed (for inflammation).    . ranitidine (ZANTAC) 150 MG tablet Take 1 tablet (150 mg total) by mouth 2 (two) times daily. 60 tablet 1  . rizatriptan (MAXALT) 5 MG tablet USE AS DIRECTED (Patient taking differently: Take 5 mg by mouth as needed for migraine) 10 tablet 0  . rosuvastatin (CRESTOR) 5 MG tablet TAKE 1 TABLET BY MOUTH ONCE DAILY. 30 tablet 0  . verapamil (CALAN-SR) 120 MG CR tablet Take 120 mg by mouth at bedtime.     No current facility-administered medications on file prior to visit.     Social History   Tobacco Use  . Smoking status: Never Smoker  . Smokeless tobacco: Never  Used  Substance Use Topics  . Alcohol use: No    Alcohol/week: 0.0 oz  . Drug use: No    Review of Systems  Constitutional: Negative for chills and fever.  Respiratory: Negative for cough and shortness of breath.   Cardiovascular: Negative for chest pain, palpitations and leg swelling.  Gastrointestinal: Negative for nausea and vomiting.  Skin: Positive for wound. Negative for rash.      Objective:    BP 138/68 (BP Location: Left Arm, Patient Position: Sitting, Cuff Size: Normal)   Pulse 68   Temp 98.4 F (36.9 C) (Oral)   Wt 167 lb 4 oz (75.9 kg)   SpO2 99%   BMI 29.63 kg/m  BP Readings from Last 3 Encounters:  05/20/18 138/68  04/14/18 118/72  02/07/18 128/72   Wt Readings from Last 3 Encounters:  05/20/18 167 lb 4 oz (75.9 kg)  04/14/18 167 lb 6.4 oz (75.9 kg)  02/07/18 168 lb 8 oz (76.4 kg)    Physical Exam  Constitutional: She appears well-developed and well-nourished.  Eyes: Conjunctivae are normal.  Cardiovascular: Normal rate, regular rhythm, normal heart sounds and normal pulses.  No LE edema, palpable cords or masses. No erythema or increased warmth. No asymmetry in calf size when compared bilaterally LE hair growth symmetric and present. No discoloration of varicosities noted. LE warm and palpable pedal pulses.   Pulmonary/Chest: Effort normal and breath sounds normal. She has no wheezes. She has no rhonchi. She has no rales.  Neurological: She is alert.  Skin: Skin is warm and dry.     2-3 cm scab noted right lower extremity as noted on diagram.  No purulent discharge.  Increased erythema when compared to left lower extremity.  Localized redness proximal to scab.  No fluctuance appreciated or streaking.  Psychiatric: She has a normal mood and affect. Her speech is normal and behavior is normal. Thought content normal.  Vitals reviewed.      Assessment & Plan:   1. Wound of right lower extremity, subsequent encounter Patient is well-appearing.   She is nontoxic in appearance.  I am pleased that the bleeding has been controlled; only scant bleeding noted on dressings since leaving emergency room.  Discussed with patient at great length her history of antibiotic allergies, anaphylaxis.  We jointly decided at this time since symptomatically the redness, warmth has improved over the past day, we would not treat with oral antibiotic unless necessary.    Patient will stay vigilant, and if any concerns of  worsening which were addressed in detail with patient including heat, warmth, redness, tenderness patient understands to start clindamycin and to take a probiotic . She has use clindamycin topically in the past without allergic reaction.    She will remain vigilant for allergies to clindamycin as well.  Patient let me know if no improvement - clindamycin (CLEOCIN) 300 MG capsule; Take 1 capsule (300 mg total) by mouth 3 (three) times daily.  Dispense: 21 capsule; Refill: 0   I am having Mehek B. Tallent start on clindamycin. I am also having her maintain her EPINEPHrine, rizatriptan, clopidogrel, verapamil, nitroGLYCERIN, carvedilol, mesalamine, ranitidine, aspirin EC, prednisoLONE acetate, Melatonin, rosuvastatin, and escitalopram.   Meds ordered this encounter  Medications  . clindamycin (CLEOCIN) 300 MG capsule    Sig: Take 1 capsule (300 mg total) by mouth 3 (three) times daily.    Dispense:  21 capsule    Refill:  0    Order Specific Question:   Supervising Provider    Answer:   Crecencio Mc [[id number]]    Return precautions given.   Risks, benefits, and alternatives of the medications and treatment plan prescribed today were discussed, and patient expressed understanding.   Education regarding symptom management and diagnosis given to patient on AVS.  Continue to follow with Einar Pheasant, MD for routine health maintenance.   [name] and I agreed with plan.   Mable Paris, FNP

## 2018-05-20 NOTE — Patient Instructions (Addendum)
  Start antibiotic ( clindamycin) if any concern of infection.  Hopefully you will not need since you have had some improvement.   If you start antibiotic - be sure to start probiotics as clindamycin can be associated with severe diarrheal infections.   Ensure to take probiotics while on antibiotics and also for 2 weeks after completion. It is important to re-colonize the gut with good bacteria and also to prevent any diarrheal infections associated with antibiotic use.   Let us know if not better

## 2018-05-23 ENCOUNTER — Ambulatory Visit
Admission: RE | Admit: 2018-05-23 | Discharge: 2018-05-23 | Disposition: A | Discharge status: Home / Self Care | Payer: BC Managed Care – PPO | Source: Ambulatory Visit | Attending: Internal Medicine | Admitting: Internal Medicine

## 2018-05-23 DIAGNOSIS — Z1231 Encounter for screening mammogram for malignant neoplasm of breast: Secondary | ICD-10-CM | POA: Insufficient documentation

## 2018-05-31 ENCOUNTER — Ambulatory Visit: Payer: BC Managed Care – PPO | Admitting: Family Medicine

## 2018-06-20 ENCOUNTER — Other Ambulatory Visit: Payer: Self-pay | Admitting: Internal Medicine

## 2018-08-05 ENCOUNTER — Telehealth: Payer: Self-pay

## 2018-08-05 NOTE — Telephone Encounter (Signed)
Copied from [place] [id number]. Topic: Inquiry >> Aug 05, 2018  3:51 PM Oliver Pila B wrote: Reason for CRM: pt called b/c she's had Melodie Bouillon; pt wonders if anything more should be checked for when she comes in for her lab appt on the 14th of OCT; contact pt to advise

## 2018-08-08 NOTE — Telephone Encounter (Signed)
Patient is not having any sx at this time. Patient says she just wanted to be sure that she did not need to have any additional labs done while she was coming in for labs.

## 2018-08-08 NOTE — Telephone Encounter (Signed)
If doing fine, does not need additional labs.  Can get information for our records on where she was diagnosed and evaluated.

## 2018-08-10 NOTE — Telephone Encounter (Signed)
LMTCB

## 2018-08-11 NOTE — Telephone Encounter (Signed)
Patient returned call

## 2018-08-11 NOTE — Telephone Encounter (Signed)
Patient was dx by ENT Dr. Charolett Bumpers. Will call and request records.

## 2018-08-11 NOTE — Telephone Encounter (Signed)
Patient called to speak to [place]. Larena Glassman was a lunch. Please call patient back.

## 2018-08-12 NOTE — Telephone Encounter (Signed)
Sent electronic request for records

## 2018-08-22 ENCOUNTER — Other Ambulatory Visit (INDEPENDENT_AMBULATORY_CARE_PROVIDER_SITE_OTHER): Payer: BC Managed Care – PPO

## 2018-08-22 DIAGNOSIS — D509 Iron deficiency anemia, unspecified: Secondary | ICD-10-CM

## 2018-08-22 DIAGNOSIS — E7800 Pure hypercholesterolemia, unspecified: Secondary | ICD-10-CM

## 2018-08-22 DIAGNOSIS — E559 Vitamin D deficiency, unspecified: Secondary | ICD-10-CM | POA: Diagnosis not present

## 2018-08-22 DIAGNOSIS — I10 Essential (primary) hypertension: Secondary | ICD-10-CM | POA: Diagnosis not present

## 2018-08-22 LAB — CBC WITH DIFFERENTIAL/PLATELET
BASOS PCT: 1.1 % (ref 0.0–3.0)
Basophils Absolute: 0.1 10*3/uL (ref 0.0–0.1)
EOS PCT: 3.2 % (ref 0.0–5.0)
Eosinophils Absolute: 0.2 10*3/uL (ref 0.0–0.7)
HCT: 37.1 % (ref 36.0–46.0)
HEMOGLOBIN: 12.2 g/dL (ref 12.0–15.0)
Lymphocytes Relative: 25.6 % (ref 12.0–46.0)
Lymphs Abs: 1.7 10*3/uL (ref 0.7–4.0)
MCHC: 33 g/dL (ref 30.0–36.0)
MCV: 91.5 fl (ref 78.0–100.0)
MONO ABS: 0.8 10*3/uL (ref 0.1–1.0)
Monocytes Relative: 11.4 % (ref 3.0–12.0)
Neutro Abs: 3.9 10*3/uL (ref 1.4–7.7)
Neutrophils Relative %: 58.7 % (ref 43.0–77.0)
Platelets: 291 10*3/uL (ref 150.0–400.0)
RBC: 4.06 Mil/uL (ref 3.87–5.11)
RDW: 14.2 % (ref 11.5–15.5)
WBC: 6.7 10*3/uL (ref 4.0–10.5)

## 2018-08-22 LAB — HEPATIC FUNCTION PANEL
ALT: 15 U/L (ref 0–35)
AST: 14 U/L (ref 0–37)
Albumin: 4 g/dL (ref 3.5–5.2)
Alkaline Phosphatase: 86 U/L (ref 39–117)
BILIRUBIN DIRECT: 0 mg/dL (ref 0.0–0.3)
BILIRUBIN TOTAL: 0.4 mg/dL (ref 0.2–1.2)
Total Protein: 6.8 g/dL (ref 6.0–8.3)

## 2018-08-22 LAB — BASIC METABOLIC PANEL
BUN: 17 mg/dL (ref 6–23)
CALCIUM: 9.1 mg/dL (ref 8.4–10.5)
CO2: 30 mEq/L (ref 19–32)
CREATININE: 0.85 mg/dL (ref 0.40–1.20)
Chloride: 104 mEq/L (ref 96–112)
GFR: 71.68 mL/min (ref >60.00)
Glucose, Bld: 71 mg/dL (ref 70–99)
Potassium: 4.3 mEq/L (ref 3.5–5.1)
SODIUM: 140 meq/L (ref 135–145)

## 2018-08-22 LAB — LIPID PANEL
CHOLESTEROL: 162 mg/dL (ref 0–200)
HDL: 46.6 mg/dL (ref >39.00)
LDL Cholesterol: 86 mg/dL (ref 0–99)
NonHDL: 115.31
Total CHOL/HDL Ratio: 3
Triglycerides: 146 mg/dL (ref 0.0–149.0)
VLDL: 29.2 mg/dL (ref 0.0–40.0)

## 2018-08-22 LAB — FERRITIN: FERRITIN: 11.3 ng/mL (ref 10.0–291.0)

## 2018-08-22 LAB — TSH: TSH: 2.63 u[IU]/mL (ref 0.35–4.50)

## 2018-08-22 LAB — VITAMIN D 25 HYDROXY (VIT D DEFICIENCY, FRACTURES): VITD: 24.78 ng/mL — ABNORMAL LOW (ref 30.00–100.00)

## 2018-08-26 ENCOUNTER — Other Ambulatory Visit: Payer: BC Managed Care – PPO

## 2018-08-29 ENCOUNTER — Encounter: Payer: BC Managed Care – PPO | Admitting: Internal Medicine

## 2018-09-02 ENCOUNTER — Ambulatory Visit (INDEPENDENT_AMBULATORY_CARE_PROVIDER_SITE_OTHER): Payer: BC Managed Care – PPO | Admitting: Internal Medicine

## 2018-09-02 ENCOUNTER — Ambulatory Visit (INDEPENDENT_AMBULATORY_CARE_PROVIDER_SITE_OTHER): Payer: BC Managed Care – PPO

## 2018-09-02 VITALS — BP 118/70 | HR 56 | Temp 98.4°F | Resp 15 | Ht 62.0 in | Wt 168.5 lb

## 2018-09-02 DIAGNOSIS — E559 Vitamin D deficiency, unspecified: Secondary | ICD-10-CM

## 2018-09-02 DIAGNOSIS — I2510 Atherosclerotic heart disease of native coronary artery without angina pectoris: Secondary | ICD-10-CM

## 2018-09-02 DIAGNOSIS — D509 Iron deficiency anemia, unspecified: Secondary | ICD-10-CM | POA: Diagnosis not present

## 2018-09-02 DIAGNOSIS — Z0000 Encounter for general adult medical examination without abnormal findings: Secondary | ICD-10-CM | POA: Diagnosis not present

## 2018-09-02 DIAGNOSIS — K50919 Crohn's disease, unspecified, with unspecified complications: Secondary | ICD-10-CM

## 2018-09-02 DIAGNOSIS — M79671 Pain in right foot: Secondary | ICD-10-CM

## 2018-09-02 DIAGNOSIS — E7800 Pure hypercholesterolemia, unspecified: Secondary | ICD-10-CM

## 2018-09-02 DIAGNOSIS — K219 Gastro-esophageal reflux disease without esophagitis: Secondary | ICD-10-CM

## 2018-09-02 DIAGNOSIS — I10 Essential (primary) hypertension: Secondary | ICD-10-CM

## 2018-09-02 DIAGNOSIS — F439 Reaction to severe stress, unspecified: Secondary | ICD-10-CM

## 2018-09-02 DIAGNOSIS — F32A Depression, unspecified: Secondary | ICD-10-CM

## 2018-09-02 DIAGNOSIS — F329 Major depressive disorder, single episode, unspecified: Secondary | ICD-10-CM

## 2018-09-02 NOTE — Progress Notes (Signed)
Subjective:    Patient ID: Morgan Moore, female    DOB: 01-03-1956, 62 y.o.   MRN: [id number]  HPI  Patient here for her physical exam.  She reports she is doing relatively well.  Saw Dr Kathyrn Sheriff 05/2018.  Had issues with her ear.  States right eardrum burst.  Doing better now.  Trying to stay active.  Feels better.  No chest pain.  Breathing stable.  No acid reflux.  No abdominal pain.  Bowels moving.  Had colonoscopy 08/26/18 - Dr Cephas Darby.  Discussed labs.  Decreased ferritin and vitamin D.  Discussed starting vitamin D3 and integra.  Persistent pain - right medial heel.  Given persistent issue, wants evaluated.     Past Medical History:  Diagnosis Date  . Allergy   . Anxiety   . Chicken pox   . Complication of anesthesia    vomitting  . Coronary artery disease   . Crohn disease ([place])   . DVT (deep venous thrombosis) ([place]) 2017  . Dyspnea    on excertion  . Dysrhythmia 2019   palpitations  . GERD (gastroesophageal reflux disease)   . Heart disease   . Heart murmur   . Heart murmur   . Hyperlipidemia   . Hypertension   . Inflammatory bowel disease   . Migraines    hormonal, puberty  . Myocardial infarction ([place])   . Nephrolithiasis   . Phlebitis    Past Surgical History:  Procedure Laterality Date  . BREAST CYST ASPIRATION     unsure of side  . CARPAL TUNNEL RELEASE Right 12/02/2017   Procedure: CARPAL TUNNEL RELEASE ENDOSCOPIC;  Surgeon: Corky Mull, MD;  Location: [facility] ORS;  Service: Orthopedics;  Laterality: Right;  . CORONARY ANGIOPLASTY WITH STENT PLACEMENT  2017   x 2  . EYE SURGERY Bilateral    cataract surgery  . GANGLION CYST EXCISION Right 12/02/2017   Procedure: REMOVAL GANGLION OF WRIST;  Surgeon: Corky Mull, MD;  Location: [facility] ORS;  Service: Orthopedics;  Laterality: Right;  . heart murmur    . [place]  . TUBAL LIGATION  1993  . tubaligation  1990   Family History  Problem Relation Age of Onset  . Heart  disease Father   . Heart disease Brother   . Cancer Maternal Aunt   . Breast cancer Maternal Aunt   . Stroke Mother   . Kidney disease Neg Hx   . GU problems Neg Hx   . Kidney cancer Neg Hx    Social History   Socioeconomic History  . Marital status: Single    Spouse name: Not on file  . Number of children: Not on file  . Years of education: Not on file  . Highest education level: Not on file  Occupational History  . Not on file  Social Needs  . Financial resource strain: Not on file  . Food insecurity:    Worry: Not on file    Inability: Not on file  . Transportation needs:    Medical: Not on file    Non-medical: Not on file  Tobacco Use  . Smoking status: Never Smoker  . Smokeless tobacco: Never Used  Substance and Sexual Activity  . Alcohol use: No    Alcohol/week: 0.0 standard drinks  . Drug use: No  . Sexual activity: Not on file  Lifestyle  . Physical activity:    Days per week: Not on file    Minutes per  session: Not on file  . Stress: Not on file  Relationships  . Social connections:    Talks on phone: Not on file    Gets together: Not on file    Attends religious service: Not on file    Active member of club or organization: Not on file    Attends meetings of clubs or organizations: Not on file    Relationship status: Not on file  Other Topics Concern  . Not on file  Social History Narrative   Had 2 kids. Daughter died in 01/09/12 son living    Former Chartered loss adjuster     Outpatient Encounter Medications as of 09/02/2018  Medication Sig  . aspirin EC 81 MG tablet Take 81 mg by mouth daily.  . carvedilol (COREG) 3.125 MG tablet Take 1.5625 mg by mouth 2 (two) times daily with a meal.   . clopidogrel (PLAVIX) 75 MG tablet Take 75 mg by mouth daily.  Marland Kitchen EPINEPHrine 0.3 mg/0.3 mL IJ SOAJ injection Inject 0.3 mg into the muscle once.   . escitalopram (LEXAPRO) 20 MG tablet TAKE 1 TABLET BY MOUTH ONCE DAILY.  . Melatonin 3-10 MG TABS Take 3 mg by mouth at  bedtime.  . mesalamine (LIALDA) 1.2 g EC tablet Take 1.2 g by mouth 2 (two) times daily.   . nitroGLYCERIN (NITROSTAT) 0.4 MG SL tablet Place 0.4 mg under the tongue every 5 (five) minutes as needed for chest pain.  . prednisoLONE acetate (PRED FORTE) 1 % ophthalmic suspension Place 1 drop into both eyes 2 (two) times daily as needed (for inflammation).  . ranitidine (ZANTAC) 150 MG tablet Take 1 tablet (150 mg total) by mouth 2 (two) times daily.  . rizatriptan (MAXALT) 5 MG tablet USE AS DIRECTED (Patient taking differently: Take 5 mg by mouth as needed for migraine)  . rosuvastatin (CRESTOR) 5 MG tablet TAKE 1 TABLET BY MOUTH ONCE DAILY.  . verapamil (CALAN-SR) 120 MG CR tablet Take 120 mg by mouth at bedtime.  . [DISCONTINUED] clindamycin (CLEOCIN) 300 MG capsule Take 1 capsule (300 mg total) by mouth 3 (three) times daily. (Patient not taking: Reported on 09/02/2018)   No facility-administered encounter medications on file as of 09/02/2018.     Review of Systems  Constitutional: Negative for appetite change and unexpected weight change.  HENT: Negative for congestion and sinus pressure.   Eyes: Negative for pain and visual disturbance.  Respiratory: Negative for cough, chest tightness and shortness of breath.   Cardiovascular: Negative for chest pain and palpitations.  Gastrointestinal: Negative for abdominal pain, diarrhea, nausea and vomiting.  Genitourinary: Negative for difficulty urinating and dysuria.  Musculoskeletal: Negative for joint swelling and myalgias.       Right heel pain as outlined.    Skin: Negative for color change and rash.  Neurological: Negative for dizziness, light-headedness and headaches.  Hematological: Negative for adenopathy. Does not bruise/bleed easily.  Psychiatric/Behavioral: Negative for agitation and dysphoric mood.       Objective:    Physical Exam  Constitutional: She is oriented to person, place, and time. She appears well-developed and  well-nourished. No distress.  HENT:  Nose: Nose normal.  Mouth/Throat: Oropharynx is clear and moist.  Eyes: Right eye exhibits no discharge. Left eye exhibits no discharge. No scleral icterus.  Neck: Neck supple. No thyromegaly present.  Cardiovascular: Normal rate and regular rhythm.  Pulmonary/Chest: Breath sounds normal. No accessory muscle usage. No tachypnea. No respiratory distress. She has no decreased breath sounds. She has  no wheezes. She has no rhonchi. Right breast exhibits no inverted nipple, no mass, no nipple discharge and no tenderness (no axillary adenopathy). Left breast exhibits no inverted nipple, no mass, no nipple discharge and no tenderness (no axilarry adenopathy).  Abdominal: Soft. Bowel sounds are normal. There is no tenderness.  Musculoskeletal: She exhibits no edema or tenderness.  Lymphadenopathy:    She has no cervical adenopathy.  Neurological: She is alert and oriented to person, place, and time.  Skin: No rash noted. No erythema.  Psychiatric: She has a normal mood and affect. Her behavior is normal.    BP 118/70 (BP Location: Left Arm, Patient Position: Sitting, Cuff Size: Large)   Pulse (!) 56   Temp 98.4 F (36.9 C) (Oral)   Resp 15   Ht 5' 2"  (1.575 m)   Wt 168 lb 8 oz (76.4 kg)   SpO2 97%   BMI 30.82 kg/m  Wt Readings from Last 3 Encounters:  09/02/18 168 lb 8 oz (76.4 kg)  05/20/18 167 lb 4 oz (75.9 kg)  04/14/18 167 lb 6.4 oz (75.9 kg)     Lab Results  Component Value Date   WBC 6.7 08/22/2018   HGB 12.2 08/22/2018   HCT 37.1 08/22/2018   PLT 291.0 08/22/2018   GLUCOSE 71 08/22/2018   CHOL 162 08/22/2018   TRIG 146.0 08/22/2018   HDL 46.60 08/22/2018   LDLCALC 86 08/22/2018   ALT 15 08/22/2018   AST 14 08/22/2018   NA 140 08/22/2018   K 4.3 08/22/2018   CL 104 08/22/2018   CREATININE 0.85 08/22/2018   BUN 17 08/22/2018   CO2 30 08/22/2018   TSH 2.63 08/22/2018   INR 1.0 12/23/2017    Mm 3d Screen Breast  Bilateral  Result Date: 05/23/2018 CLINICAL DATA:  Screening. EXAM: DIGITAL SCREENING BILATERAL MAMMOGRAM WITH TOMO AND CAD COMPARISON:  Previous exam(s). ACR Breast Density Category c: The breast tissue is heterogeneously dense, which may obscure small masses. FINDINGS: There are no findings suspicious for malignancy. Images were processed with CAD. IMPRESSION: No mammographic evidence of malignancy. A result letter of this screening mammogram will be mailed directly to the patient. RECOMMENDATION: Screening mammogram in one year. (Code:SM-B-01Y) BI-RADS CATEGORY  1: Negative. Electronically Signed   By: Franki Cabot M.D.   On: 05/23/2018 15:34       Assessment & Plan:   Problem List Items Addressed This Visit    Anemia    S/p colonoscopy.  Recent hgb wnl.  Ferritin - low.  Start integra.  Follow cbc and ferritin.        Relevant Orders   CBC with Differential/Platelet   Ferritin   CAD (coronary artery disease)    Followed by cardiology.  Continue risk factor modification.  Currently doing well.        Crohn disease ([place])    Followed by GI.  Just had colonoscopy.  Stable.        Depression    Doing well on current regimen.  Follow.        Essential hypertension    Blood pressure under good control.  Continue same medication regimen.  Follow pressures.  Follow metabolic panel.        Relevant Orders   Basic metabolic panel   GERD (gastroesophageal reflux disease)    Controlled on current regimen.  Follow.        Health care maintenance    Physical today 09/02/18.  PAP 01/24/16.  Mammogram 05/23/18 -  birads I.  Colonoscopy 08/2018.        Hypercholesterolemia    On crestor.  Low cholesterol diet and exercise.  Follow  Lipid panel and liver function tests.        Relevant Orders   Hepatic function panel   Lipid panel   Stress    Increased stress.  Discussed with her today.  On lexapro.  Doing well.  Has good support.  Follow.        Vitamin D deficiency    Start  vitamin D supplements.  Follow.         Other Visit Diagnoses    Routine general medical examination at a health care facility    -  Primary   Pain of right heel       Check xray.  May need referral to podiatry.     Relevant Orders   DG Foot 2 Views Right (Completed)       Einar Pheasant, MD

## 2018-09-04 ENCOUNTER — Encounter: Payer: Self-pay | Admitting: Internal Medicine

## 2018-09-04 NOTE — Assessment & Plan Note (Signed)
Blood pressure under good control.  Continue same medication regimen.  Follow pressures.  Follow metabolic panel.   

## 2018-09-04 NOTE — Assessment & Plan Note (Signed)
Increased stress.  Discussed with her today.  On lexapro.  Doing well.  Has good support.  Follow.

## 2018-09-04 NOTE — Assessment & Plan Note (Signed)
On crestor.  Low cholesterol diet and exercise.  Follow  Lipid panel and liver function tests.

## 2018-09-04 NOTE — Assessment & Plan Note (Signed)
Controlled on current regimen.  Follow.  

## 2018-09-04 NOTE — Assessment & Plan Note (Signed)
Followed by cardiology.  Continue risk factor modification.  Currently doing well.

## 2018-09-04 NOTE — Assessment & Plan Note (Signed)
Start vitamin D supplements.  Follow.

## 2018-09-04 NOTE — Assessment & Plan Note (Signed)
S/p colonoscopy.  Recent hgb wnl.  Ferritin - low.  Start integra.  Follow cbc and ferritin.

## 2018-09-04 NOTE — Assessment & Plan Note (Signed)
Doing well on current regimen.  Follow.

## 2018-09-04 NOTE — Assessment & Plan Note (Signed)
Followed by GI.  Just had colonoscopy.  Stable.

## 2018-09-04 NOTE — Assessment & Plan Note (Signed)
Physical today 09/02/18.  PAP 01/24/16.  Mammogram 05/23/18 - birads I.  Colonoscopy 08/2018.

## 2018-09-06 ENCOUNTER — Other Ambulatory Visit: Payer: Self-pay | Admitting: Internal Medicine

## 2018-09-06 DIAGNOSIS — M79671 Pain in right foot: Secondary | ICD-10-CM

## 2018-09-06 NOTE — Progress Notes (Signed)
Order placed for podiatry referral.

## 2018-09-23 ENCOUNTER — Encounter: Payer: Self-pay | Admitting: Podiatry

## 2018-09-23 ENCOUNTER — Ambulatory Visit: Payer: BC Managed Care – PPO

## 2018-09-23 ENCOUNTER — Ambulatory Visit (INDEPENDENT_AMBULATORY_CARE_PROVIDER_SITE_OTHER): Payer: BC Managed Care – PPO | Admitting: Podiatry

## 2018-09-23 VITALS — BP 125/62 | HR 69

## 2018-09-23 DIAGNOSIS — M722 Plantar fascial fibromatosis: Secondary | ICD-10-CM | POA: Diagnosis not present

## 2018-09-23 DIAGNOSIS — M2170 Unequal limb length (acquired), unspecified site: Secondary | ICD-10-CM | POA: Diagnosis not present

## 2018-09-23 DIAGNOSIS — M79671 Pain in right foot: Secondary | ICD-10-CM

## 2018-09-26 NOTE — Progress Notes (Signed)
   Subjective: 62 year old female presenting today as a new patient with a chief complaint of pain to the plantar aspect of the medial right heel that began 3-4 weeks ago. She states she has always had issues with pain in her right foot. She reports wearing orthotics but needs new ones. Walking and standing for prolonged periods of time increases the pain. She has not had any recent treatment. Patient is here for further evaluation and treatment.   Past Medical History:  Diagnosis Date  . Allergy   . Anxiety   . Chicken pox   . Complication of anesthesia    vomitting  . Coronary artery disease   . Crohn disease ([place])   . DVT (deep venous thrombosis) ([place]) 2017  . Dyspnea    on excertion  . Dysrhythmia 2019   palpitations  . GERD (gastroesophageal reflux disease)   . Heart disease   . Heart murmur   . Heart murmur   . Hyperlipidemia   . Hypertension   . Inflammatory bowel disease   . Migraines    hormonal, puberty  . Myocardial infarction ([place])   . Nephrolithiasis   . Phlebitis      Objective: Physical Exam General: The patient is alert and oriented x3 in no acute distress.  Dermatology: Skin is warm, dry and supple bilateral lower extremities. Negative for open lesions or macerations bilateral.   Vascular: Dorsalis Pedis and Posterior Tibial pulses palpable bilateral.  Capillary fill time is immediate to all digits.  Neurological: Epicritic and protective threshold intact bilateral.   Musculoskeletal: Limb length discrepancy noted with right lower extremity longer than left. Tenderness to palpation to the plantar aspect of the right heel along the plantar fascia. All other joints range of motion within normal limits bilateral. Strength 5/5 in all groups bilateral.   Radiographic exam: Normal osseous mineralization. Joint spaces preserved. No fracture/dislocation/boney destruction. No other soft tissue abnormalities or radiopaque foreign bodies.   Assessment: 1.  Plantar fasciitis right 2. Limb length discrepancy, right longer than left   Plan of Care:  1. Patient evaluated. Xrays reviewed.   2. Declined injection.  3. Declined oral NSAIDs.  4. Recommended OTC Motrin as needed.  5. Appointment with Liliane Channel, Pedorthist, for custom molded orthotics.  6. Return to clinic as needed.    Edrick Kins, DPM [facility]  Dr. Edrick Kins, DPM    [address], [place] [address]                Office [phone]  Fax [phone]

## 2018-10-04 ENCOUNTER — Ambulatory Visit: Payer: Self-pay

## 2018-10-04 ENCOUNTER — Other Ambulatory Visit: Payer: Self-pay | Admitting: Internal Medicine

## 2018-10-04 MED ORDER — OSELTAMIVIR PHOSPHATE 75 MG PO CAPS: 75.0000 mg | ORAL_CAPSULE | Freq: Two times a day (BID) | ORAL | 0 refills | Status: DC

## 2018-10-04 NOTE — Telephone Encounter (Signed)
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 Reports she is concerned because of her history. She does not have any symptoms. Wants to know if Dr. Nicki Reaper thinks she would benefit from Tamiflu. She uses [employer]. Please advise pt.

## 2018-10-04 NOTE — Telephone Encounter (Signed)
Please advise asap

## 2018-10-04 NOTE — Telephone Encounter (Signed)
Called pt and discussed.  She states she was not in direct contact with the student.  She is concerned about possible exposure.  Discussed prophylaxis.  She does not feel needs prophylaxis.  rx sent in for tamiflu if needed.

## 2018-10-04 NOTE — Progress Notes (Signed)
rx sent in for tamiflu #10 with no refills.

## 2018-10-12 ENCOUNTER — Ambulatory Visit (INDEPENDENT_AMBULATORY_CARE_PROVIDER_SITE_OTHER): Payer: BC Managed Care – PPO | Admitting: Orthotics

## 2018-10-12 DIAGNOSIS — M2170 Unequal limb length (acquired), unspecified site: Secondary | ICD-10-CM

## 2018-10-12 DIAGNOSIS — M722 Plantar fascial fibromatosis: Secondary | ICD-10-CM | POA: Diagnosis not present

## 2018-10-12 NOTE — Progress Notes (Signed)

## 2018-10-26 ENCOUNTER — Ambulatory Visit: Payer: BC Managed Care – PPO | Admitting: Orthotics

## 2018-10-26 DIAGNOSIS — M722 Plantar fascial fibromatosis: Secondary | ICD-10-CM

## 2018-10-26 NOTE — Progress Notes (Signed)
Patient came in today to pick up custom made foot orthotics.  The goals were accomplished and the patient reported no dissatisfaction with said orthotics.  Patient was advised of breakin period and how to report any issues. 

## 2018-10-27 ENCOUNTER — Telehealth: Payer: Self-pay | Admitting: Podiatry

## 2018-10-27 NOTE — Telephone Encounter (Signed)
Late entry from 12.18.19 pt left voicemail stating she seen Liliane Channel in the am and she called the insurance company for a second pair that were dress pair of orthotics and they said its based on medical necessity and we would need to file a prior authorization. Pt stated she did not want the 2nd pair if insurance was not going to pay for them.   I returned call and per [facility] and billing we would not be able to get the prior authorization back before the end of year to file the insurance and it would not be a quarantee of payment and we could not quarantee that she would not have to pay for the second pair. She said she did not want to proceed.

## 2018-10-28 DIAGNOSIS — K621 Rectal polyp: Secondary | ICD-10-CM | POA: Insufficient documentation

## 2018-11-07 ENCOUNTER — Other Ambulatory Visit: Payer: Self-pay | Admitting: Internal Medicine

## 2018-11-11 DIAGNOSIS — K921 Melena: Secondary | ICD-10-CM | POA: Insufficient documentation

## 2018-11-13 MED ORDER — RANITIDINE HCL 150 MG PO TABS
150.00 | ORAL_TABLET | ORAL | Status: DC
Start: 2018-11-13 — End: 2018-11-13

## 2018-11-13 MED ORDER — CARVEDILOL 3.125 MG PO TABS
1.56 | ORAL_TABLET | ORAL | Status: DC
Start: 2018-11-13 — End: 2018-11-13

## 2018-11-13 MED ORDER — POLYETHYLENE GLYCOL 3350 17 G PO PACK
17.00 | PACK | ORAL | Status: DC
Start: 2018-11-14 — End: 2018-11-13

## 2018-11-13 MED ORDER — LIDOCAINE HCL (PF) 1 % IJ SOLN
.50 | INTRAMUSCULAR | Status: DC
Start: ? — End: 2018-11-13

## 2018-11-13 MED ORDER — ACETAMINOPHEN 325 MG PO TABS
650.00 | ORAL_TABLET | ORAL | Status: DC
Start: ? — End: 2018-11-13

## 2018-11-13 MED ORDER — ASPIRIN 81 MG PO CHEW
81.00 | CHEWABLE_TABLET | ORAL | Status: DC
Start: 2018-11-14 — End: 2018-11-13

## 2018-11-13 MED ORDER — VERAPAMIL HCL ER 120 MG PO CP24
120.00 | ORAL_CAPSULE | ORAL | Status: DC
Start: 2018-11-13 — End: 2018-11-13

## 2018-11-13 MED ORDER — MESALAMINE ER 250 MG PO CPCR
1000.00 | ORAL_CAPSULE | ORAL | Status: DC
Start: 2018-11-13 — End: 2018-11-13

## 2018-11-13 MED ORDER — ROSUVASTATIN CALCIUM 5 MG PO TABS
5.00 | ORAL_TABLET | ORAL | Status: DC
Start: 2018-11-14 — End: 2018-11-13

## 2018-11-13 MED ORDER — PREDNISOLONE ACETATE 1 % OP SUSP
1.00 | OPHTHALMIC | Status: DC
Start: ? — End: 2018-11-13

## 2018-11-13 MED ORDER — ESCITALOPRAM OXALATE 10 MG PO TABS
20.00 | ORAL_TABLET | ORAL | Status: DC
Start: 2018-11-13 — End: 2018-11-13

## 2018-11-13 MED ORDER — SODIUM CHLORIDE FLUSH 0.9 % IV SOLN
5.00 | INTRAVENOUS | Status: DC
Start: ? — End: 2018-11-13

## 2018-11-13 MED ORDER — DOCUSATE SODIUM 100 MG PO CAPS
100.00 | ORAL_CAPSULE | ORAL | Status: DC
Start: 2018-11-13 — End: 2018-11-13

## 2018-11-13 MED ORDER — GABAPENTIN 100 MG PO CAPS
100.00 | ORAL_CAPSULE | ORAL | Status: DC
Start: 2018-11-13 — End: 2018-11-13

## 2018-12-20 ENCOUNTER — Other Ambulatory Visit: Payer: Self-pay | Admitting: Internal Medicine

## 2019-01-02 ENCOUNTER — Other Ambulatory Visit (INDEPENDENT_AMBULATORY_CARE_PROVIDER_SITE_OTHER): Payer: BC Managed Care – PPO

## 2019-01-02 DIAGNOSIS — D509 Iron deficiency anemia, unspecified: Secondary | ICD-10-CM

## 2019-01-02 DIAGNOSIS — E7800 Pure hypercholesterolemia, unspecified: Secondary | ICD-10-CM

## 2019-01-02 DIAGNOSIS — I10 Essential (primary) hypertension: Secondary | ICD-10-CM

## 2019-01-02 LAB — CBC WITH DIFFERENTIAL/PLATELET
Basophils Absolute: 0 10*3/uL (ref 0.0–0.1)
Basophils Relative: 0.6 % (ref 0.0–3.0)
EOS PCT: 3.2 % (ref 0.0–5.0)
Eosinophils Absolute: 0.2 10*3/uL (ref 0.0–0.7)
HCT: 36.4 % (ref 36.0–46.0)
Hemoglobin: 11.9 g/dL — ABNORMAL LOW (ref 12.0–15.0)
Lymphocytes Relative: 21.9 % (ref 12.0–46.0)
Lymphs Abs: 1.5 10*3/uL (ref 0.7–4.0)
MCHC: 32.8 g/dL (ref 30.0–36.0)
MCV: 88.4 fl (ref 78.0–100.0)
Monocytes Absolute: 0.8 10*3/uL (ref 0.1–1.0)
Monocytes Relative: 11.4 % (ref 3.0–12.0)
Neutro Abs: 4.4 10*3/uL (ref 1.4–7.7)
Neutrophils Relative %: 62.9 % (ref 43.0–77.0)
Platelets: 349 10*3/uL (ref 150.0–400.0)
RBC: 4.12 Mil/uL (ref 3.87–5.11)
RDW: 15.8 % — ABNORMAL HIGH (ref 11.5–15.5)
WBC: 7 10*3/uL (ref 4.0–10.5)

## 2019-01-02 LAB — BASIC METABOLIC PANEL
BUN: 15 mg/dL (ref 6–23)
CO2: 29 mEq/L (ref 19–32)
Calcium: 9.5 mg/dL (ref 8.4–10.5)
Chloride: 105 mEq/L (ref 96–112)
Creatinine, Ser: 0.92 mg/dL (ref 0.40–1.20)
GFR: 61.48 mL/min (ref >60.00)
Glucose, Bld: 89 mg/dL (ref 70–99)
Potassium: 4.3 mEq/L (ref 3.5–5.1)
SODIUM: 141 meq/L (ref 135–145)

## 2019-01-02 LAB — LIPID PANEL
Cholesterol: 168 mg/dL (ref 0–200)
HDL: 44.6 mg/dL (ref >39.00)
LDL Cholesterol: 92 mg/dL (ref 0–99)
NonHDL: 123.19
Total CHOL/HDL Ratio: 4
Triglycerides: 158 mg/dL — ABNORMAL HIGH (ref 0.0–149.0)
VLDL: 31.6 mg/dL (ref 0.0–40.0)

## 2019-01-02 LAB — HEPATIC FUNCTION PANEL
ALT: 21 U/L (ref 0–35)
AST: 21 U/L (ref 0–37)
Albumin: 4.1 g/dL (ref 3.5–5.2)
Alkaline Phosphatase: 90 U/L (ref 39–117)
Bilirubin, Direct: 0.1 mg/dL (ref 0.0–0.3)
Total Bilirubin: 0.4 mg/dL (ref 0.2–1.2)
Total Protein: 7 g/dL (ref 6.0–8.3)

## 2019-01-02 LAB — FERRITIN: Ferritin: 13.7 ng/mL (ref 10.0–291.0)

## 2019-01-06 ENCOUNTER — Encounter: Payer: Self-pay | Admitting: Internal Medicine

## 2019-01-06 ENCOUNTER — Ambulatory Visit: Payer: BC Managed Care – PPO | Admitting: Internal Medicine

## 2019-01-06 DIAGNOSIS — F329 Major depressive disorder, single episode, unspecified: Secondary | ICD-10-CM

## 2019-01-06 DIAGNOSIS — K50919 Crohn's disease, unspecified, with unspecified complications: Secondary | ICD-10-CM

## 2019-01-06 DIAGNOSIS — I2510 Atherosclerotic heart disease of native coronary artery without angina pectoris: Secondary | ICD-10-CM

## 2019-01-06 DIAGNOSIS — I10 Essential (primary) hypertension: Secondary | ICD-10-CM

## 2019-01-06 DIAGNOSIS — E559 Vitamin D deficiency, unspecified: Secondary | ICD-10-CM

## 2019-01-06 DIAGNOSIS — D509 Iron deficiency anemia, unspecified: Secondary | ICD-10-CM

## 2019-01-06 DIAGNOSIS — E7800 Pure hypercholesterolemia, unspecified: Secondary | ICD-10-CM

## 2019-01-06 DIAGNOSIS — N859 Noninflammatory disorder of uterus, unspecified: Secondary | ICD-10-CM

## 2019-01-06 DIAGNOSIS — K219 Gastro-esophageal reflux disease without esophagitis: Secondary | ICD-10-CM

## 2019-01-06 DIAGNOSIS — F32A Depression, unspecified: Secondary | ICD-10-CM

## 2019-01-06 MED ORDER — INTEGRA 62.5-62.5-40-3 MG PO CAPS: ORAL_CAPSULE | ORAL | 1 refills | Status: DC

## 2019-01-06 NOTE — Progress Notes (Signed)
Patient ID: Morgan Moore, female   DOB: 1956/03/24, 63 y.o.   MRN: [id number]   Subjective:    Patient ID: Morgan Moore, female    DOB: Aug 29, 1956, 63 y.o.   MRN: [id number]  HPI  Patient here for a scheduled follow up.  Is s/p transanal resection of a distal rectal lesion 10/31/18 - tubular adenoma.  Recommended proctoscopy in 6 months.  Seeing Dr Sheryn Bison.  Bowels moving.  No abdominal pain.  Is s/p gum graft.  Affecting her diet.  Eating soft foods.  Has f/u planned next week.  Was in the hospital 11/10/18-11/13/18 with BRBPR - persistent.  Had vasovagal episode with nausea and lightheadedness.  She was started on fluids and made NPO.  hgb followed. Bleeding stopped while in the hospital.  plavix held.  Diet advanced.  No further bleeding.  Seeing Dr Sheryn Bison as outlined.  Overall she feels she is doing relatively well.  Feels things overall are stable.  Breathing stable.  No abdominal pain.  No acid reflux.  While in the hospital, had CT scan which revealed mild wall thickening of the transverse and descending colon.  Also noted trace endometrial fluid.  Recommended pelvic ultrasound for further evaluation.  She denies any vaginal bleeding or pain.     Past Medical History:  Diagnosis Date  . Allergy   . Anxiety   . Chicken pox   . Complication of anesthesia    vomitting  . Coronary artery disease   . Crohn disease ([place])   . DVT (deep venous thrombosis) ([place]) 2017  . Dyspnea    on excertion  . Dysrhythmia 2019   palpitations  . GERD (gastroesophageal reflux disease)   . Heart disease   . Heart murmur   . Heart murmur   . Hyperlipidemia   . Hypertension   . Inflammatory bowel disease   . Migraines    hormonal, puberty  . Myocardial infarction ([place])   . Nephrolithiasis   . Phlebitis    Past Surgical History:  Procedure Laterality Date  . BREAST CYST ASPIRATION     unsure of side  . CARPAL TUNNEL RELEASE Right 12/02/2017   Procedure: CARPAL TUNNEL RELEASE ENDOSCOPIC;   Surgeon: Corky Mull, MD;  Location: [facility] ORS;  Service: Orthopedics;  Laterality: Right;  . CORONARY ANGIOPLASTY WITH STENT PLACEMENT  2017   x 2  . EYE SURGERY Bilateral    cataract surgery  . GANGLION CYST EXCISION Right 12/02/2017   Procedure: REMOVAL GANGLION OF WRIST;  Surgeon: Corky Mull, MD;  Location: [facility] ORS;  Service: Orthopedics;  Laterality: Right;  . heart murmur    . [place]  . TUBAL LIGATION  1993  . tubaligation  1990   Family History  Problem Relation Age of Onset  . Heart disease Father   . Heart disease Brother   . Cancer Maternal Aunt   . Breast cancer Maternal Aunt   . Stroke Mother   . Kidney disease Neg Hx   . GU problems Neg Hx   . Kidney cancer Neg Hx    Social History   Socioeconomic History  . Marital status: Single    Spouse name: Not on file  . Number of children: Not on file  . Years of education: Not on file  . Highest education level: Not on file  Occupational History  . Not on file  Social Needs  . Financial resource strain: Not on file  . Food insecurity:  Worry: Not on file    Inability: Not on file  . Transportation needs:    Medical: Not on file    Non-medical: Not on file  Tobacco Use  . Smoking status: Never Smoker  . Smokeless tobacco: Never Used  Substance and Sexual Activity  . Alcohol use: No    Alcohol/week: 0.0 standard drinks  . Drug use: No  . Sexual activity: Not on file  Lifestyle  . Physical activity:    Days per week: Not on file    Minutes per session: Not on file  . Stress: Not on file  Relationships  . Social connections:    Talks on phone: Not on file    Gets together: Not on file    Attends religious service: Not on file    Active member of club or organization: Not on file    Attends meetings of clubs or organizations: Not on file    Relationship status: Not on file  Other Topics Concern  . Not on file  Social History Narrative   Had 2 kids. Daughter died in  January 10, 2012 son living    Former Chartered loss adjuster     Outpatient Encounter Medications as of 01/06/2019  Medication Sig  . aspirin EC 81 MG tablet Take 81 mg by mouth daily.  . carvedilol (COREG) 3.125 MG tablet Take 1.5625 mg by mouth 2 (two) times daily with a meal.   . clopidogrel (PLAVIX) 75 MG tablet Take 75 mg by mouth daily.  . diphenhydrAMINE (BENADRYL) 12.5 MG/5ML elixir Take by mouth.  . EPINEPHrine 0.3 mg/0.3 mL IJ SOAJ injection Inject 0.3 mg into the muscle once.   . escitalopram (LEXAPRO) 20 MG tablet TAKE 1 TABLET BY MOUTH ONCE DAILY.  Marland Kitchen Fe Fum-FePoly-Vit C-Vit B3 (INTEGRA) 62.5-62.5-40-3 MG CAPS One tablet daily  . Melatonin 3 MG TABS Take by mouth.  . mesalamine (LIALDA) 1.2 g EC tablet Take 1.2 g by mouth 2 (two) times daily.   . nitroGLYCERIN (NITROSTAT) 0.4 MG SL tablet DISSOLVE (1) TABLET UNDER TONGUE AS NEEDED TO RELIEVE CHEST PAIN. MAYREPEAT EVERY 5 MINUTES.  Marland Kitchen ondansetron (ZOFRAN-ODT) 4 MG disintegrating tablet   . prednisoLONE acetate (PRED FORTE) 1 % ophthalmic suspension Place 1 drop into both eyes 2 (two) times daily as needed (for inflammation).  . ranitidine (ZANTAC) 150 MG tablet Take 1 tablet (150 mg total) by mouth 2 (two) times daily.  . rizatriptan (MAXALT) 5 MG tablet USE AS DIRECTED (Patient taking differently: Take 5 mg by mouth as needed for migraine)  . rosuvastatin (CRESTOR) 5 MG tablet TAKE 1 TABLET BY MOUTH ONCE DAILY.  . verapamil (CALAN-SR) 120 MG CR tablet Take 120 mg by mouth at bedtime.  . [DISCONTINUED] benzonatate (TESSALON) 200 MG capsule   . [DISCONTINUED] clindamycin (CLEOCIN) 300 MG capsule   . [DISCONTINUED] guaifenesin (HUMIBID E) 400 MG TABS tablet   . [DISCONTINUED] levofloxacin (LEVAQUIN) 500 MG tablet   . [DISCONTINUED] Melatonin 3-10 MG TABS Take 3 mg by mouth at bedtime.  . [DISCONTINUED] oseltamivir (TAMIFLU) 75 MG capsule Take 1 capsule (75 mg total) by mouth 2 (two) times daily.  . [DISCONTINUED] oxyCODONE (OXY IR/ROXICODONE) 5 MG  immediate release tablet   . [DISCONTINUED] predniSONE (DELTASONE) 5 MG tablet    No facility-administered encounter medications on file as of 01/06/2019.     Review of Systems  Constitutional: Negative for appetite change and unexpected weight change.  HENT: Negative for congestion and sinus pressure.   Respiratory: Negative for cough,  chest tightness and shortness of breath.   Cardiovascular: Negative for chest pain, palpitations and leg swelling.  Gastrointestinal: Negative for abdominal pain, diarrhea, nausea and vomiting.  Genitourinary: Negative for difficulty urinating and dysuria.  Musculoskeletal: Negative for joint swelling and myalgias.  Skin: Negative for color change and rash.  Neurological: Negative for dizziness, light-headedness and headaches.  Psychiatric/Behavioral: Negative for agitation and dysphoric mood.       Objective:    Physical Exam Constitutional:      General: She is not in acute distress.    Appearance: Normal appearance.  HENT:     Nose: Nose normal. No congestion.     Mouth/Throat:     Pharynx: No oropharyngeal exudate or posterior oropharyngeal erythema.  Neck:     Musculoskeletal: Neck supple. No muscular tenderness.     Thyroid: No thyromegaly.  Cardiovascular:     Rate and Rhythm: Normal rate and regular rhythm.  Pulmonary:     Effort: No respiratory distress.     Breath sounds: Normal breath sounds. No wheezing.  Abdominal:     General: Bowel sounds are normal.     Palpations: Abdomen is soft.     Tenderness: There is no abdominal tenderness.  Musculoskeletal:        General: No swelling or tenderness.  Lymphadenopathy:     Cervical: No cervical adenopathy.  Skin:    Findings: No erythema or rash.  Neurological:     Mental Status: She is alert.  Psychiatric:        Mood and Affect: Mood normal.        Behavior: Behavior normal.     BP 122/70   Pulse 71   Temp 98 F (36.7 C) (Oral)   Resp 16   Wt 168 lb 6.4 oz (76.4 kg)    SpO2 96%   BMI 30.80 kg/m  Wt Readings from Last 3 Encounters:  01/06/19 168 lb 6.4 oz (76.4 kg)  09/02/18 168 lb 8 oz (76.4 kg)  05/20/18 167 lb 4 oz (75.9 kg)     Lab Results  Component Value Date   WBC 7.0 01/02/2019   HGB 11.9 (L) 01/02/2019   HCT 36.4 01/02/2019   PLT 349.0 01/02/2019   GLUCOSE 89 01/02/2019   CHOL 168 01/02/2019   TRIG 158.0 (H) 01/02/2019   HDL 44.60 01/02/2019   LDLCALC 92 01/02/2019   ALT 21 01/02/2019   AST 21 01/02/2019   NA 141 01/02/2019   K 4.3 01/02/2019   CL 105 01/02/2019   CREATININE 0.92 01/02/2019   BUN 15 01/02/2019   CO2 29 01/02/2019   TSH 2.63 08/22/2018   INR 1.0 12/23/2017    Mm 3d Screen Breast Bilateral  Result Date: 05/23/2018 CLINICAL DATA:  Screening. EXAM: DIGITAL SCREENING BILATERAL MAMMOGRAM WITH TOMO AND CAD COMPARISON:  Previous exam(s). ACR Breast Density Category c: The breast tissue is heterogeneously dense, which may obscure small masses. FINDINGS: There are no findings suspicious for malignancy. Images were processed with CAD. IMPRESSION: No mammographic evidence of malignancy. A result letter of this screening mammogram will be mailed directly to the patient. RECOMMENDATION: Screening mammogram in one year. (Code:SM-B-01Y) BI-RADS CATEGORY  1: Negative. Electronically Signed   By: Franki Cabot M.D.   On: 05/23/2018 15:34       Assessment & Plan:   Problem List Items Addressed This Visit    Anemia    Slightly decreased hgb.  Just admitted with rectal bleeding, etc.  Seeing Dr Sheryn Bison. Planning  for f/u proctoscopy in 6 months.  Follow cbc and ferritin.  Start integra.        Relevant Medications   Fe Fum-FePoly-Vit C-Vit B3 (INTEGRA) 62.5-62.5-40-3 MG CAPS   Other Relevant Orders   CBC with Differential/Platelet   Ferritin   CAD (coronary artery disease)    Continue risk factor modification.  Feels things are stable.  Continue f/u with cardiology.        Crohn disease ([place])    Followed by GI. On  lialda.        Depression    Doing well on current regimen.  Follow.        Essential hypertension    Blood pressure under good control.  Continue same medication regimen.  Follow pressures.  Follow metabolic panel.        Fluid in endometrial cavity    Found incidentally on recent CT scan.  Recommended pelvic ultrasound.  Refer to gyn for further evaluation and f/u pelvic ultrasound.        Relevant Orders   Ambulatory referral to Gynecology   GERD (gastroesophageal reflux disease)    Controlled.        Hypercholesterolemia    On crestor.  Low cholesterol diet and exercise.  Follow lipid panel and liver function tests.        Vitamin D deficiency    Follow vitamin D level.            Einar Pheasant, MD

## 2019-01-08 ENCOUNTER — Encounter: Payer: Self-pay | Admitting: Internal Medicine

## 2019-01-08 DIAGNOSIS — N859 Noninflammatory disorder of uterus, unspecified: Secondary | ICD-10-CM | POA: Insufficient documentation

## 2019-01-08 NOTE — Assessment & Plan Note (Signed)
Followed by GI. On lialda.

## 2019-01-08 NOTE — Assessment & Plan Note (Signed)
On crestor.  Low cholesterol diet and exercise.  Follow lipid panel and liver function tests.

## 2019-01-08 NOTE — Assessment & Plan Note (Signed)
Follow vitamin D level.

## 2019-01-08 NOTE — Assessment & Plan Note (Signed)
Controlled.  

## 2019-01-08 NOTE — Assessment & Plan Note (Signed)
Slightly decreased hgb.  Just admitted with rectal bleeding, etc.  Seeing Dr Sheryn Bison. Planning for f/u proctoscopy in 6 months.  Follow cbc and ferritin.  Start integra.

## 2019-01-08 NOTE — Assessment & Plan Note (Signed)
Found incidentally on recent CT scan.  Recommended pelvic ultrasound.  Refer to gyn for further evaluation and f/u pelvic ultrasound.

## 2019-01-08 NOTE — Assessment & Plan Note (Signed)
Doing well on current regimen.  Follow.

## 2019-01-08 NOTE — Assessment & Plan Note (Signed)
Continue risk factor modification.  Feels things are stable.  Continue f/u with cardiology.

## 2019-01-08 NOTE — Assessment & Plan Note (Signed)
Blood pressure under good control.  Continue same medication regimen.  Follow pressures.  Follow metabolic panel.   

## 2019-03-06 ENCOUNTER — Other Ambulatory Visit (INDEPENDENT_AMBULATORY_CARE_PROVIDER_SITE_OTHER): Payer: BC Managed Care – PPO

## 2019-03-06 ENCOUNTER — Other Ambulatory Visit: Payer: Self-pay

## 2019-03-06 DIAGNOSIS — D509 Iron deficiency anemia, unspecified: Secondary | ICD-10-CM

## 2019-03-06 LAB — CBC WITH DIFFERENTIAL/PLATELET
Basophils Absolute: 0.1 10*3/uL (ref 0.0–0.1)
Basophils Relative: 0.8 % (ref 0.0–3.0)
Eosinophils Absolute: 0.4 10*3/uL (ref 0.0–0.7)
Eosinophils Relative: 4.3 % (ref 0.0–5.0)
HCT: 36.1 % (ref 36.0–46.0)
Hemoglobin: 12.1 g/dL (ref 12.0–15.0)
Lymphocytes Relative: 20.5 % (ref 12.0–46.0)
Lymphs Abs: 1.7 10*3/uL (ref 0.7–4.0)
MCHC: 33.5 g/dL (ref 30.0–36.0)
MCV: 88.3 fl (ref 78.0–100.0)
Monocytes Absolute: 1 10*3/uL (ref 0.1–1.0)
Monocytes Relative: 11.3 % (ref 3.0–12.0)
Neutro Abs: 5.3 10*3/uL (ref 1.4–7.7)
Neutrophils Relative %: 63.1 % (ref 43.0–77.0)
Platelets: 321 10*3/uL (ref 150.0–400.0)
RBC: 4.08 Mil/uL (ref 3.87–5.11)
RDW: 16.1 % — ABNORMAL HIGH (ref 11.5–15.5)
WBC: 8.4 10*3/uL (ref 4.0–10.5)

## 2019-03-06 LAB — FERRITIN: Ferritin: 8 ng/mL — ABNORMAL LOW (ref 10.0–291.0)

## 2019-03-15 ENCOUNTER — Other Ambulatory Visit: Payer: Self-pay | Admitting: Internal Medicine

## 2019-04-11 ENCOUNTER — Other Ambulatory Visit: Payer: Self-pay | Admitting: Internal Medicine

## 2019-05-10 ENCOUNTER — Telehealth: Payer: Self-pay | Admitting: *Deleted

## 2019-05-10 ENCOUNTER — Other Ambulatory Visit: Payer: Self-pay | Admitting: Internal Medicine

## 2019-05-10 DIAGNOSIS — I10 Essential (primary) hypertension: Secondary | ICD-10-CM

## 2019-05-10 DIAGNOSIS — Z1231 Encounter for screening mammogram for malignant neoplasm of breast: Secondary | ICD-10-CM

## 2019-05-10 DIAGNOSIS — D509 Iron deficiency anemia, unspecified: Secondary | ICD-10-CM

## 2019-05-10 DIAGNOSIS — E7800 Pure hypercholesterolemia, unspecified: Secondary | ICD-10-CM

## 2019-05-10 NOTE — Telephone Encounter (Signed)
Orders placed for labs

## 2019-05-10 NOTE — Telephone Encounter (Signed)
Please place future orders for lab appt.  

## 2019-05-11 ENCOUNTER — Other Ambulatory Visit: Payer: Self-pay

## 2019-05-11 ENCOUNTER — Other Ambulatory Visit (INDEPENDENT_AMBULATORY_CARE_PROVIDER_SITE_OTHER): Payer: BC Managed Care – PPO

## 2019-05-11 DIAGNOSIS — E7800 Pure hypercholesterolemia, unspecified: Secondary | ICD-10-CM | POA: Diagnosis not present

## 2019-05-11 DIAGNOSIS — I10 Essential (primary) hypertension: Secondary | ICD-10-CM

## 2019-05-11 DIAGNOSIS — D509 Iron deficiency anemia, unspecified: Secondary | ICD-10-CM

## 2019-05-11 LAB — BASIC METABOLIC PANEL
BUN: 18 mg/dL (ref 6–23)
CO2: 27 mEq/L (ref 19–32)
Calcium: 9.3 mg/dL (ref 8.4–10.5)
Chloride: 104 mEq/L (ref 96–112)
Creatinine, Ser: 0.95 mg/dL (ref 0.40–1.20)
GFR: 59.18 mL/min — ABNORMAL LOW (ref >60.00)
Glucose, Bld: 96 mg/dL (ref 70–99)
Potassium: 4 mEq/L (ref 3.5–5.1)
Sodium: 140 mEq/L (ref 135–145)

## 2019-05-11 LAB — LIPID PANEL
Cholesterol: 185 mg/dL (ref 0–200)
HDL: 52.2 mg/dL (ref >39.00)
LDL Cholesterol: 105 mg/dL — ABNORMAL HIGH (ref 0–99)
NonHDL: 133
Total CHOL/HDL Ratio: 4
Triglycerides: 140 mg/dL (ref 0.0–149.0)
VLDL: 28 mg/dL (ref 0.0–40.0)

## 2019-05-11 LAB — HEPATIC FUNCTION PANEL
ALT: 20 U/L (ref 0–35)
AST: 16 U/L (ref 0–37)
Albumin: 4.3 g/dL (ref 3.5–5.2)
Alkaline Phosphatase: 91 U/L (ref 39–117)
Bilirubin, Direct: 0.1 mg/dL (ref 0.0–0.3)
Total Bilirubin: 0.4 mg/dL (ref 0.2–1.2)
Total Protein: 6.7 g/dL (ref 6.0–8.3)

## 2019-05-11 LAB — CBC WITH DIFFERENTIAL/PLATELET
Basophils Absolute: 0.1 10*3/uL (ref 0.0–0.1)
Basophils Relative: 0.7 % (ref 0.0–3.0)
Eosinophils Absolute: 0.4 10*3/uL (ref 0.0–0.7)
Eosinophils Relative: 4.9 % (ref 0.0–5.0)
HCT: 39.3 % (ref 36.0–46.0)
Hemoglobin: 13.1 g/dL (ref 12.0–15.0)
Lymphocytes Relative: 20.1 % (ref 12.0–46.0)
Lymphs Abs: 1.5 10*3/uL (ref 0.7–4.0)
MCHC: 33.3 g/dL (ref 30.0–36.0)
MCV: 93.3 fl (ref 78.0–100.0)
Monocytes Absolute: 0.8 10*3/uL (ref 0.1–1.0)
Monocytes Relative: 10.8 % (ref 3.0–12.0)
Neutro Abs: 4.8 10*3/uL (ref 1.4–7.7)
Neutrophils Relative %: 63.5 % (ref 43.0–77.0)
Platelets: 290 10*3/uL (ref 150.0–400.0)
RBC: 4.21 Mil/uL (ref 3.87–5.11)
RDW: 16.9 % — ABNORMAL HIGH (ref 11.5–15.5)
WBC: 7.6 10*3/uL (ref 4.0–10.5)

## 2019-05-11 LAB — FERRITIN: Ferritin: 25.1 ng/mL (ref 10.0–291.0)

## 2019-05-16 ENCOUNTER — Ambulatory Visit (INDEPENDENT_AMBULATORY_CARE_PROVIDER_SITE_OTHER): Payer: BC Managed Care – PPO | Admitting: Internal Medicine

## 2019-05-16 ENCOUNTER — Other Ambulatory Visit: Payer: Self-pay

## 2019-05-16 DIAGNOSIS — D509 Iron deficiency anemia, unspecified: Secondary | ICD-10-CM | POA: Diagnosis not present

## 2019-05-16 DIAGNOSIS — I2510 Atherosclerotic heart disease of native coronary artery without angina pectoris: Secondary | ICD-10-CM

## 2019-05-16 DIAGNOSIS — F320 Major depressive disorder, single episode, mild: Secondary | ICD-10-CM

## 2019-05-16 DIAGNOSIS — I10 Essential (primary) hypertension: Secondary | ICD-10-CM

## 2019-05-16 DIAGNOSIS — K219 Gastro-esophageal reflux disease without esophagitis: Secondary | ICD-10-CM

## 2019-05-16 DIAGNOSIS — K50919 Crohn's disease, unspecified, with unspecified complications: Secondary | ICD-10-CM | POA: Diagnosis not present

## 2019-05-16 DIAGNOSIS — E7800 Pure hypercholesterolemia, unspecified: Secondary | ICD-10-CM

## 2019-05-16 DIAGNOSIS — F32A Depression, unspecified: Secondary | ICD-10-CM

## 2019-05-16 MED ORDER — METRONIDAZOLE 1 % EX GEL: Freq: Two times a day (BID) | CUTANEOUS | 0 refills | Status: DC | PRN

## 2019-05-16 NOTE — Progress Notes (Signed)
Patient ID: Morgan Moore, female   DOB: 09/22/1956, 63 y.o.   MRN: [id number]   Virtual Visit via telephone Note  This visit type was conducted due to national recommendations for restrictions regarding the COVID-19 pandemic (e.g. social distancing).  This format is felt to be most appropriate for this patient at this time.  All issues noted in this document were discussed and addressed.  No physical exam was performed (except for noted visual exam findings with Video Visits).   I connected with Oswaldo Done by telephone and verified that I am speaking with the correct person using two identifiers. Location patient: home Location provider: work Persons participating in the telephone visit: patient, provider  I discussed the limitations, risks, security and privacy concerns of performing an evaluation and management service by telephone and the availability of in person appointments.  The patient expressed understanding and agreed to proceed.   Reason for visit: scheduled follow up.   HPI: She reports she is doing relatively well.  Saw cardiology 04/27/19 for f/u CAD.  Stable.  No changes made.  Recommended f/u in 6 months.  She has been evaluated by gyn - thickened endometrial stripe.  S/p biopsy.  Needs to schedule f/u with gyn. She will schedule.  She had noticed previously that her left ankle was red and inflamed. Wore compression hose.  No redness now.  No inflammation now.  No swelling.  Desires no further intervention at this time.  Previous spider bite.  Resolved now.  Has noticed some hip, wrist and ankle pain.  Discussed exercise and stretches.  Desires no further evaluation or intervention.  No headache.  No fever.  No chest pain, cough, chest congestion or sob.  No acid reflux.  No abdominal pain.  Bowels moving.  Handling stress.     ROS: See pertinent positives and negatives per HPI.  Past Medical History:  Diagnosis Date  . Allergy   . Anxiety   . Chicken pox   .  Complication of anesthesia    vomitting  . Coronary artery disease   . Crohn disease ([place])   . DVT (deep venous thrombosis) ([place]) 2017  . Dyspnea    on excertion  . Dysrhythmia 2019   palpitations  . GERD (gastroesophageal reflux disease)   . Heart disease   . Heart murmur   . Heart murmur   . Hyperlipidemia   . Hypertension   . Inflammatory bowel disease   . Migraines    hormonal, puberty  . Myocardial infarction ([place])   . Nephrolithiasis   . Phlebitis     Past Surgical History:  Procedure Laterality Date  . BREAST CYST ASPIRATION     unsure of side  . CARPAL TUNNEL RELEASE Right 12/02/2017   Procedure: CARPAL TUNNEL RELEASE ENDOSCOPIC;  Surgeon: Corky Mull, MD;  Location: [facility] ORS;  Service: Orthopedics;  Laterality: Right;  . CORONARY ANGIOPLASTY WITH STENT PLACEMENT  2017   x 2  . EYE SURGERY Bilateral    cataract surgery  . GANGLION CYST EXCISION Right 12/02/2017   Procedure: REMOVAL GANGLION OF WRIST;  Surgeon: Corky Mull, MD;  Location: [facility] ORS;  Service: Orthopedics;  Laterality: Right;  . heart murmur    . [place]  . TUBAL LIGATION  1993  . tubaligation  1990    Family History  Problem Relation Age of Onset  . Heart disease Father   . Heart disease Brother   . Cancer Maternal Aunt   .  Breast cancer Maternal Aunt   . Stroke Mother   . Kidney disease Neg Hx   . GU problems Neg Hx   . Kidney cancer Neg Hx     SOCIAL HX: reviewed.    Current Outpatient Medications:  .  pantoprazole (PROTONIX) 20 MG tablet, Take by mouth., Disp: , Rfl:  .  aspirin EC 81 MG tablet, Take 81 mg by mouth daily., Disp: , Rfl:  .  carvedilol (COREG) 3.125 MG tablet, Take 1.5625 mg by mouth 2 (two) times daily with a meal. , Disp: , Rfl:  .  clopidogrel (PLAVIX) 75 MG tablet, Take 75 mg by mouth daily., Disp: , Rfl:  .  diphenhydrAMINE (BENADRYL) 12.5 MG/5ML elixir, Take by mouth., Disp: , Rfl:  .  EPINEPHrine 0.3 mg/0.3 mL IJ SOAJ  injection, Inject 0.3 mg into the muscle once. , Disp: , Rfl:  .  escitalopram (LEXAPRO) 20 MG tablet, TAKE 1 TABLET BY MOUTH ONCE DAILY., Disp: 90 tablet, Rfl: 0 .  Fe Fum-FePoly-Vit C-Vit B3 (INTEGRA) 62.5-62.5-40-3 MG CAPS, TAKE (1) CAPSULE BY MOUTH ONCE DAILY., Disp: 30 capsule, Rfl: 2 .  Melatonin 3 MG TABS, Take by mouth., Disp: , Rfl:  .  mesalamine (LIALDA) 1.2 g EC tablet, Take 1.2 g by mouth 2 (two) times daily. , Disp: , Rfl:  .  metroNIDAZOLE (METROGEL) 1 % gel, Apply topically 2 (two) times daily as needed., Disp: 45 g, Rfl: 0 .  nitroGLYCERIN (NITROSTAT) 0.4 MG SL tablet, DISSOLVE (1) TABLET UNDER TONGUE AS NEEDED TO RELIEVE CHEST PAIN. MAYREPEAT EVERY 5 MINUTES., Disp: 25 tablet, Rfl: 0 .  ondansetron (ZOFRAN-ODT) 4 MG disintegrating tablet, , Disp: , Rfl:  .  prednisoLONE acetate (PRED FORTE) 1 % ophthalmic suspension, Place 1 drop into both eyes 2 (two) times daily as needed (for inflammation)., Disp: , Rfl:  .  rizatriptan (MAXALT) 5 MG tablet, USE AS DIRECTED (Patient taking differently: Take 5 mg by mouth as needed for migraine), Disp: 10 tablet, Rfl: 0 .  rosuvastatin (CRESTOR) 5 MG tablet, TAKE 1 TABLET BY MOUTH ONCE DAILY., Disp: 30 tablet, Rfl: 0 .  verapamil (CALAN-SR) 120 MG CR tablet, Take 120 mg by mouth at bedtime., Disp: , Rfl:   EXAM:  GENERAL: alert, oriented.  Sounds to be in no acute distress.  Answering questions appropriately.    PSYCH/NEURO: pleasant and cooperative, no obvious depression or anxiety, speech and thought processing grossly intact  ASSESSMENT AND PLAN:  Discussed the following assessment and plan:  Anemia S/p resection of distal rectal lesion (Dr Sheryn Bison).  Last evaluated 10/31/18.  Recommended f/u in 6 months.  Follow cbc.   CAD (coronary artery disease) Just evaluated by cardiology. Stable.  Continue risk factor modification.   Crohn disease ([place]) Followed by GI.  On lialda.   Mild depression ([place]) On lexapro.  Doing well.     Essential hypertension Blood pressure has been doing well.  Continue current medication regimen. f ollow pressures.  Follow metabolic panel.   GERD (gastroesophageal reflux disease) Controlled on current regimen.    Hypercholesterolemia On crestor.  Low cholesterol diet and exercise.  Follow lipid panel and liver function tests.      I discussed the assessment and treatment plan with the patient. The patient was provided an opportunity to ask questions and all were answered. The patient agreed with the plan and demonstrated an understanding of the instructions.   The patient was advised to call back or seek an in-person evaluation if the  symptoms worsen or if the condition fails to improve as anticipated.  I provided 25 minutes of non-face-to-face time during this encounter.   Einar Pheasant, MD

## 2019-05-21 ENCOUNTER — Encounter: Payer: Self-pay | Admitting: Internal Medicine

## 2019-05-21 NOTE — Assessment & Plan Note (Signed)
Blood pressure has been doing well.  Continue current medication regimen. f ollow pressures.  Follow metabolic panel.

## 2019-05-21 NOTE — Assessment & Plan Note (Signed)
Controlled on current regimen.   

## 2019-05-21 NOTE — Assessment & Plan Note (Signed)
Just evaluated by cardiology. Stable.  Continue risk factor modification.

## 2019-05-21 NOTE — Assessment & Plan Note (Signed)
On crestor.  Low cholesterol diet and exercise.  Follow lipid panel and liver function tests.

## 2019-05-21 NOTE — Assessment & Plan Note (Signed)
On lexapro.  Doing well.

## 2019-05-21 NOTE — Assessment & Plan Note (Signed)
Followed by GI.  On lialda.

## 2019-05-21 NOTE — Assessment & Plan Note (Addendum)
S/p resection of distal rectal lesion (Dr Sheryn Bison).  Last evaluated 10/31/18.  Recommended f/u in 6 months.  Follow cbc.

## 2019-06-19 ENCOUNTER — Other Ambulatory Visit: Payer: Self-pay | Admitting: Internal Medicine

## 2019-06-19 ENCOUNTER — Ambulatory Visit
Admission: RE | Admit: 2019-06-19 | Discharge: 2019-06-19 | Disposition: A | Discharge status: Home / Self Care | Payer: BC Managed Care – PPO | Source: Ambulatory Visit | Attending: Internal Medicine | Admitting: Internal Medicine

## 2019-06-19 DIAGNOSIS — Z1231 Encounter for screening mammogram for malignant neoplasm of breast: Secondary | ICD-10-CM

## 2019-06-19 DIAGNOSIS — R928 Other abnormal and inconclusive findings on diagnostic imaging of breast: Secondary | ICD-10-CM

## 2019-06-19 DIAGNOSIS — N6489 Other specified disorders of breast: Secondary | ICD-10-CM

## 2019-06-20 ENCOUNTER — Other Ambulatory Visit: Payer: Self-pay | Admitting: Internal Medicine

## 2019-06-20 DIAGNOSIS — R928 Other abnormal and inconclusive findings on diagnostic imaging of breast: Secondary | ICD-10-CM

## 2019-06-20 NOTE — Progress Notes (Signed)
Order placed for f/u left breast mammogram and ultrasound.

## 2019-06-27 ENCOUNTER — Ambulatory Visit
Admission: RE | Admit: 2019-06-27 | Discharge: 2019-06-27 | Disposition: A | Discharge status: Home / Self Care | Payer: BC Managed Care – PPO | Source: Ambulatory Visit | Attending: Internal Medicine | Admitting: Internal Medicine

## 2019-06-27 ENCOUNTER — Other Ambulatory Visit: Payer: Self-pay | Admitting: Internal Medicine

## 2019-06-27 ENCOUNTER — Other Ambulatory Visit: Payer: Self-pay

## 2019-06-27 DIAGNOSIS — N6320 Unspecified lump in the left breast, unspecified quadrant: Secondary | ICD-10-CM

## 2019-06-27 DIAGNOSIS — R928 Other abnormal and inconclusive findings on diagnostic imaging of breast: Secondary | ICD-10-CM

## 2019-06-27 DIAGNOSIS — N6489 Other specified disorders of breast: Secondary | ICD-10-CM | POA: Diagnosis present

## 2019-07-05 ENCOUNTER — Other Ambulatory Visit: Payer: Self-pay

## 2019-07-05 ENCOUNTER — Ambulatory Visit
Admission: RE | Admit: 2019-07-05 | Discharge: 2019-07-05 | Disposition: A | Discharge status: Home / Self Care | Payer: BC Managed Care – PPO | Source: Ambulatory Visit | Attending: Internal Medicine | Admitting: Internal Medicine

## 2019-07-05 ENCOUNTER — Other Ambulatory Visit: Payer: Self-pay | Admitting: Internal Medicine

## 2019-07-05 DIAGNOSIS — R928 Other abnormal and inconclusive findings on diagnostic imaging of breast: Secondary | ICD-10-CM

## 2019-07-05 DIAGNOSIS — N6320 Unspecified lump in the left breast, unspecified quadrant: Secondary | ICD-10-CM

## 2019-07-05 HISTORY — PX: BREAST BIOPSY: SHX20

## 2019-07-06 LAB — SURGICAL PATHOLOGY

## 2019-07-09 ENCOUNTER — Other Ambulatory Visit: Payer: Self-pay | Admitting: Internal Medicine

## 2019-07-09 DIAGNOSIS — R928 Other abnormal and inconclusive findings on diagnostic imaging of breast: Secondary | ICD-10-CM

## 2019-07-09 NOTE — Progress Notes (Signed)
Orders placed for f/u left breast mammogram and ultrasound

## 2019-08-16 ENCOUNTER — Encounter: Payer: Self-pay | Admitting: Internal Medicine

## 2019-09-05 ENCOUNTER — Telehealth: Payer: Self-pay | Admitting: *Deleted

## 2019-09-05 DIAGNOSIS — M25552 Pain in left hip: Secondary | ICD-10-CM

## 2019-09-05 DIAGNOSIS — M25551 Pain in right hip: Secondary | ICD-10-CM

## 2019-09-05 NOTE — Telephone Encounter (Signed)
Patient says she has addressed you with this before, said now coming up steps has to real careful hurts really bad. Patient stated keeps her up at night, Bilateral hips. Squating makes it worse, and going up steps.Patient says she has to use two hands to pull herself up steps. Patient would like to see ortho but will see PCP first if she needs to.

## 2019-09-05 NOTE — Telephone Encounter (Signed)
Pt called you back to returning your call.

## 2019-09-05 NOTE — Telephone Encounter (Signed)
Copied from [place] [id number]. Topic: General - Inquiry >> Sep 05, 2019  1:36 PM Richardo Priest, [place] wrote: Reason for CRM: Pt called stating her hips have been bothering again that started about a few months ago. Pt stated it hurts when taking steps as well as doing yard work. Pt would like to have an x-ray done to see what the issue is. Please advise. Call back is [phone].

## 2019-09-05 NOTE — Telephone Encounter (Signed)
Given persistent pain, I am ok with referring her to ortho.  She does not need to see me first unless she feels she needs to.  Let me know if she has a preference of which orthopedist she wants to see.  If more acute pain, can go to [facility].

## 2019-09-06 NOTE — Telephone Encounter (Signed)
Order placed for ortho referral

## 2019-09-06 NOTE — Telephone Encounter (Signed)
Patient called back with orthopedist name:  Dr. Danella Sensing

## 2019-10-10 ENCOUNTER — Telehealth: Payer: Self-pay | Admitting: Podiatry

## 2019-10-10 DIAGNOSIS — M2170 Unequal limb length (acquired), unspecified site: Secondary | ICD-10-CM | POA: Diagnosis not present

## 2019-10-10 DIAGNOSIS — M722 Plantar fascial fibromatosis: Secondary | ICD-10-CM | POA: Diagnosis not present

## 2019-10-10 NOTE — Telephone Encounter (Signed)
Pt called and left message for me to call back to discuss appt scheduled for 12.2.2020 in [place].  I returned call and pt asked if she needed to keep her appt for 12.2 if she is just wanting to order 2 pr of orthotics for this year. She has [employer] and they usually cover 2 pr per yr. She would like a dress pair and a pair like the last ones.  I spoke to [place] and she did not need to come in so I will have him order them and they will call when they come in. Pt was very appreciative since she has an auto- immune disorder and said thank you very much.

## 2019-10-11 ENCOUNTER — Other Ambulatory Visit: Payer: BC Managed Care – PPO | Admitting: Orthotics

## 2019-10-23 ENCOUNTER — Other Ambulatory Visit (INDEPENDENT_AMBULATORY_CARE_PROVIDER_SITE_OTHER): Payer: BC Managed Care – PPO

## 2019-10-23 ENCOUNTER — Other Ambulatory Visit: Payer: Self-pay

## 2019-10-23 DIAGNOSIS — I10 Essential (primary) hypertension: Secondary | ICD-10-CM | POA: Diagnosis not present

## 2019-10-23 DIAGNOSIS — D509 Iron deficiency anemia, unspecified: Secondary | ICD-10-CM | POA: Diagnosis not present

## 2019-10-23 DIAGNOSIS — E7800 Pure hypercholesterolemia, unspecified: Secondary | ICD-10-CM | POA: Diagnosis not present

## 2019-10-23 LAB — CBC WITH DIFFERENTIAL/PLATELET
Basophils Absolute: 0.1 10*3/uL (ref 0.0–0.1)
Basophils Relative: 0.7 % (ref 0.0–3.0)
Eosinophils Absolute: 0.2 10*3/uL (ref 0.0–0.7)
Eosinophils Relative: 3.4 % (ref 0.0–5.0)
HCT: 37.6 % (ref 36.0–46.0)
Hemoglobin: 12.5 g/dL (ref 12.0–15.0)
Lymphocytes Relative: 18.4 % (ref 12.0–46.0)
Lymphs Abs: 1.3 10*3/uL (ref 0.7–4.0)
MCHC: 33.2 g/dL (ref 30.0–36.0)
MCV: 96.1 fl (ref 78.0–100.0)
Monocytes Absolute: 0.7 10*3/uL (ref 0.1–1.0)
Monocytes Relative: 9.8 % (ref 3.0–12.0)
Neutro Abs: 4.9 10*3/uL (ref 1.4–7.7)
Neutrophils Relative %: 67.7 % (ref 43.0–77.0)
Platelets: 284 10*3/uL (ref 150.0–400.0)
RBC: 3.91 Mil/uL (ref 3.87–5.11)
RDW: 13.6 % (ref 11.5–15.5)
WBC: 7.3 10*3/uL (ref 4.0–10.5)

## 2019-10-23 LAB — BASIC METABOLIC PANEL
BUN: 14 mg/dL (ref 6–23)
CO2: 28 mEq/L (ref 19–32)
Calcium: 9 mg/dL (ref 8.4–10.5)
Chloride: 104 mEq/L (ref 96–112)
Creatinine, Ser: 0.87 mg/dL (ref 0.40–1.20)
GFR: 65.41 mL/min (ref >60.00)
Glucose, Bld: 102 mg/dL — ABNORMAL HIGH (ref 70–99)
Potassium: 3.9 mEq/L (ref 3.5–5.1)
Sodium: 140 mEq/L (ref 135–145)

## 2019-10-23 LAB — HEPATIC FUNCTION PANEL
ALT: 19 U/L (ref 0–35)
AST: 16 U/L (ref 0–37)
Albumin: 4.1 g/dL (ref 3.5–5.2)
Alkaline Phosphatase: 87 U/L (ref 39–117)
Bilirubin, Direct: 0.1 mg/dL (ref 0.0–0.3)
Total Bilirubin: 0.5 mg/dL (ref 0.2–1.2)
Total Protein: 6.4 g/dL (ref 6.0–8.3)

## 2019-10-23 LAB — FERRITIN: Ferritin: 40.4 ng/mL (ref 10.0–291.0)

## 2019-10-23 LAB — LIPID PANEL
Cholesterol: 167 mg/dL (ref 0–200)
HDL: 48 mg/dL (ref >39.00)
LDL Cholesterol: 97 mg/dL (ref 0–99)
NonHDL: 119.26
Total CHOL/HDL Ratio: 3
Triglycerides: 111 mg/dL (ref 0.0–149.0)
VLDL: 22.2 mg/dL (ref 0.0–40.0)

## 2019-10-23 LAB — TSH: TSH: 3.45 u[IU]/mL (ref 0.35–4.50)

## 2019-10-25 ENCOUNTER — Encounter: Payer: Self-pay | Admitting: Internal Medicine

## 2019-10-25 ENCOUNTER — Other Ambulatory Visit: Payer: Self-pay

## 2019-10-25 ENCOUNTER — Ambulatory Visit (INDEPENDENT_AMBULATORY_CARE_PROVIDER_SITE_OTHER): Payer: BC Managed Care – PPO | Admitting: Internal Medicine

## 2019-10-25 DIAGNOSIS — K219 Gastro-esophageal reflux disease without esophagitis: Secondary | ICD-10-CM

## 2019-10-25 DIAGNOSIS — E559 Vitamin D deficiency, unspecified: Secondary | ICD-10-CM

## 2019-10-25 DIAGNOSIS — M25559 Pain in unspecified hip: Secondary | ICD-10-CM

## 2019-10-25 DIAGNOSIS — E2839 Other primary ovarian failure: Secondary | ICD-10-CM | POA: Diagnosis not present

## 2019-10-25 DIAGNOSIS — I2510 Atherosclerotic heart disease of native coronary artery without angina pectoris: Secondary | ICD-10-CM

## 2019-10-25 DIAGNOSIS — D509 Iron deficiency anemia, unspecified: Secondary | ICD-10-CM

## 2019-10-25 DIAGNOSIS — K50919 Crohn's disease, unspecified, with unspecified complications: Secondary | ICD-10-CM | POA: Diagnosis not present

## 2019-10-25 DIAGNOSIS — E7800 Pure hypercholesterolemia, unspecified: Secondary | ICD-10-CM

## 2019-10-25 DIAGNOSIS — M810 Age-related osteoporosis without current pathological fracture: Secondary | ICD-10-CM

## 2019-10-25 DIAGNOSIS — R9389 Abnormal findings on diagnostic imaging of other specified body structures: Secondary | ICD-10-CM

## 2019-10-25 DIAGNOSIS — F320 Major depressive disorder, single episode, mild: Secondary | ICD-10-CM

## 2019-10-25 DIAGNOSIS — F32A Depression, unspecified: Secondary | ICD-10-CM

## 2019-10-25 DIAGNOSIS — I10 Essential (primary) hypertension: Secondary | ICD-10-CM

## 2019-10-25 NOTE — Progress Notes (Signed)
Patient ID: Morgan Moore, female   DOB: [birth year], 63 y.o.   MRN: [id number]   Virtual Visit via telephone Note  This visit type was conducted due to national recommendations for restrictions regarding the COVID-19 pandemic (e.g. social distancing).  This format is felt to be most appropriate for this patient at this time.  All issues noted in this document were discussed and addressed.  No physical exam was performed (except for noted visual exam findings with Video Visits).   I connected with Morgan Moore today at  by telephone and verified that I am speaking with the correct person using two identifiers. Location patient: home Location provider: work Persons participating in the telephonel visit: patient, provider  I discussed the limitations, risks, security and privacy concerns of performing an evaluation and management service by telephone and the availability of in person appointments.  The patient expressed understanding and agreed to proceed.   Reason for visit: scheduled follow up.   HPI: She reports she is doing relatively well.  Increased stress, but she is coping well.  Stays busy.  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 Handling stress by staying busy.  No chest pain.  No sob.  No acid reflux. No abdominal pain.  No report of bowel change.  Saw ortho.  Has bursitis in both of hips.  Planning to go to therapy today.  Planning to get orthotics.  Also has a f/u with Dr Clayborn Bigness today. Was previously evaluated by gyn for thickened endometrium.  S/p biopsy.  Did not have f/u appt. Ok to reschedule.  Had colonoscopy last week.  States GI physician f/u - so she does not feel needs f/u with Dr Sheryn Bison.  States blood pressure has been doing well.  Discussed bone density.  Will schedule.     ROS: See pertinent positives and negatives per HPI.  Past Medical History:  Diagnosis Date  . Allergy   . Anxiety   . Chicken pox   . Complication of  anesthesia    vomitting  . Coronary artery disease   . Crohn disease ([place])   . DVT (deep venous thrombosis) ([place]) 2017  . Dyspnea    on excertion  . Dysrhythmia 2019   palpitations  . GERD (gastroesophageal reflux disease)   . Heart disease   . Heart murmur   . Heart murmur   . Hyperlipidemia   . Hypertension   . Inflammatory bowel disease   . Migraines    hormonal, puberty  . Myocardial infarction ([place])   . Nephrolithiasis   . Phlebitis     Past Surgical History:  Procedure Laterality Date  . BREAST CYST ASPIRATION     unsure of side  . CARPAL TUNNEL RELEASE Right 12/02/2017   Procedure: CARPAL TUNNEL RELEASE ENDOSCOPIC;  Surgeon: Corky Mull, MD;  Location: [facility] ORS;  Service: Orthopedics;  Laterality: Right;  . CORONARY ANGIOPLASTY WITH STENT PLACEMENT  2017   x 2  . EYE SURGERY Bilateral    cataract surgery  . GANGLION CYST EXCISION Right 12/02/2017   Procedure: REMOVAL GANGLION OF WRIST;  Surgeon: Corky Mull, MD;  Location: [facility] ORS;  Service: Orthopedics;  Laterality: Right;  . heart murmur    . [place]  . TUBAL LIGATION  1993  . tubaligation  1990    Family History  Problem Relation Age of Onset  . Heart disease Father   . Heart disease Brother   .  Cancer Maternal Aunt   . Breast cancer Maternal Aunt   . Stroke Mother   . Kidney disease Neg Hx   . GU problems Neg Hx   . Kidney cancer Neg Hx     SOCIAL HX: reviewed   Current Outpatient Medications:  .  aspirin EC 81 MG tablet, Take 81 mg by mouth daily., Disp: , Rfl:  .  carvedilol (COREG) 3.125 MG tablet, Take 1.5625 mg by mouth 2 (two) times daily with a meal. , Disp: , Rfl:  .  Cholecalciferol 25 MCG (1000 UT) tablet, Take 1,000 Units by mouth 2 (two) times daily., Disp: , Rfl:  .  clopidogrel (PLAVIX) 75 MG tablet, Take 75 mg by mouth daily., Disp: , Rfl:  .  cyanocobalamin 1000 MCG tablet, Take 1,000 mcg by mouth daily., Disp: , Rfl:  .  EPINEPHrine 0.3 mg/0.3  mL IJ SOAJ injection, Inject 0.3 mg into the muscle once. , Disp: , Rfl:  .  escitalopram (LEXAPRO) 20 MG tablet, TAKE 1 TABLET BY MOUTH ONCE DAILY., Disp: 90 tablet, Rfl: 0 .  Fe Fum-FePoly-Vit C-Vit B3 (INTEGRA) 62.5-62.5-40-3 MG CAPS, TAKE (1) CAPSULE BY MOUTH ONCE DAILY., Disp: 30 capsule, Rfl: 2 .  Melatonin-Pyridoxine 3-10 MG TABS, Take 1 tablet by mouth at bedtime., Disp: , Rfl:  .  mesalamine (LIALDA) 1.2 g EC tablet, Take 1.2 g by mouth 2 (two) times daily. , Disp: , Rfl:  .  metroNIDAZOLE (METROGEL) 1 % gel, Apply topically 2 (two) times daily as needed., Disp: 45 g, Rfl: 0 .  nitroGLYCERIN (NITROSTAT) 0.4 MG SL tablet, DISSOLVE (1) TABLET UNDER TONGUE AS NEEDED TO RELIEVE CHEST PAIN. MAYREPEAT EVERY 5 MINUTES., Disp: 25 tablet, Rfl: 0 .  ondansetron (ZOFRAN-ODT) 4 MG disintegrating tablet, , Disp: , Rfl:  .  pantoprazole (PROTONIX) 20 MG tablet, Take by mouth., Disp: , Rfl:  .  prednisoLONE acetate (PRED FORTE) 1 % ophthalmic suspension, Place 1 drop into both eyes 2 (two) times daily as needed (for inflammation)., Disp: , Rfl:  .  rizatriptan (MAXALT) 5 MG tablet, USE AS DIRECTED (Patient taking differently: Take 5 mg by mouth as needed for migraine), Disp: 10 tablet, Rfl: 0 .  rosuvastatin (CRESTOR) 5 MG tablet, TAKE 1 TABLET BY MOUTH ONCE DAILY., Disp: 30 tablet, Rfl: 0 .  verapamil (CALAN-SR) 120 MG CR tablet, Take 120 mg by mouth at bedtime., Disp: , Rfl:   EXAM:  GENERAL: alert.  Sounds to be in no acute distress.  Answering questions appropriately.   PSYCH/NEURO: pleasant and cooperative, no obvious depression or anxiety, speech and thought processing grossly intact  ASSESSMENT AND PLAN:  Discussed the following assessment and plan:  Anemia S/p resection of distal rectal lesion (Dr Sheryn Bison).  Had recommended f/u.  She saw her GI physician and is s/p colonoscopy - last week.  States was told f/u with Dr Sheryn Bison not necessary.  Recent hgb and ferritin wnl.  Change iron to  qod.  Follow cbc and ferritin.    CAD (coronary artery disease) Has f/u with cardiology today.  Feels stable.  Continue risk factor modification.    Crohn disease ([place]) Followed by GI.  Just had colonoscopy last week.  Obtain records.  Bowels stable.    Essential hypertension Blood pressure under good control.  Continue same medication regimen.  Follow pressures.  Follow metabolic panel.    GERD (gastroesophageal reflux disease) Controlled on current regimen.  Follow.    Hypercholesterolemia On crestor.  Low cholesterol diet and  exercise.  Follow lipid panel and liver function tests.    Mild depression ([place]) Discussed with her today.  On lexapro.  Does not feel needs any further intervention.  Follow.    Vitamin D deficiency Follow vitamin D level.    Hip pain Recently evaluated by ortho.  Has bursitis in both hips.  Planning physical therapy - starts today.  Planning to get orthotics.    Osteoporosis without current pathological fracture Needs f/u bone density.  Discussed calcium, vitamin D and weight bearing exercise.    Thickened endometrium Saw Dr Leafy Ro for evaluation of thickened endometrium.  Recommended f/u.  Overdue.  Schedule.  Pt agreeable.      I discussed the assessment and treatment plan with the patient. The patient was provided an opportunity to ask questions and all were answered. The patient agreed with the plan and demonstrated an understanding of the instructions.   The patient was advised to call back or seek an in-person evaluation if the symptoms worsen or if the condition fails to improve as anticipated.  I provided 25 minutes of non-face-to-face time during this encounter.   Einar Pheasant, MD

## 2019-10-29 ENCOUNTER — Encounter: Payer: Self-pay | Admitting: Internal Medicine

## 2019-10-29 DIAGNOSIS — R9389 Abnormal findings on diagnostic imaging of other specified body structures: Secondary | ICD-10-CM | POA: Insufficient documentation

## 2019-10-29 NOTE — Assessment & Plan Note (Signed)
Followed by GI.  Just had colonoscopy last week.  Obtain records.  Bowels stable.

## 2019-10-29 NOTE — Assessment & Plan Note (Signed)
Recently evaluated by ortho.  Has bursitis in both hips.  Planning physical therapy - starts today.  Planning to get orthotics.

## 2019-10-29 NOTE — Assessment & Plan Note (Signed)
Saw Dr Leafy Ro for evaluation of thickened endometrium.  Recommended f/u.  Overdue.  Schedule.  Pt agreeable.

## 2019-10-29 NOTE — Assessment & Plan Note (Signed)
Blood pressure under good control.  Continue same medication regimen.  Follow pressures.  Follow metabolic panel.   

## 2019-10-29 NOTE — Assessment & Plan Note (Signed)
Needs f/u bone density.  Discussed calcium, vitamin D and weight bearing exercise.

## 2019-10-29 NOTE — Assessment & Plan Note (Addendum)
S/p resection of distal rectal lesion (Dr Sheryn Bison).  Had recommended f/u.  She saw her GI physician and is s/p colonoscopy - last week.  States was told f/u with Dr Sheryn Bison not necessary.  Recent hgb and ferritin wnl.  Change iron to qod.  Follow cbc and ferritin.

## 2019-10-29 NOTE — Assessment & Plan Note (Signed)
Follow vitamin D level.

## 2019-10-29 NOTE — Assessment & Plan Note (Signed)
Has f/u with cardiology today.  Feels stable.  Continue risk factor modification.

## 2019-10-29 NOTE — Assessment & Plan Note (Signed)
Controlled on current regimen.  Follow.  

## 2019-10-29 NOTE — Assessment & Plan Note (Signed)
Discussed with her today.  On lexapro.  Does not feel needs any further intervention.  Follow.

## 2019-10-29 NOTE — Assessment & Plan Note (Addendum)
On crestor.  Low cholesterol diet and exercise.  Follow lipid panel and liver function tests.

## 2019-10-30 DIAGNOSIS — M2170 Unequal limb length (acquired), unspecified site: Secondary | ICD-10-CM

## 2019-10-30 DIAGNOSIS — M722 Plantar fascial fibromatosis: Secondary | ICD-10-CM

## 2019-11-16 ENCOUNTER — Telehealth: Payer: Self-pay | Admitting: Podiatry

## 2019-11-16 NOTE — Telephone Encounter (Signed)
Pt left message yesterday checking on status of orthotics for [place] office. They were ordered beginning of December.  Upon checking they are lost with [employer] and I have contacted Angela Cox and they are remaking both pair and I notified pt of the situation and told pt  I will call pt when they arrive.

## 2019-11-21 ENCOUNTER — Encounter: Payer: Self-pay | Admitting: Internal Medicine

## 2019-12-02 ENCOUNTER — Encounter: Payer: Self-pay | Admitting: Internal Medicine

## 2019-12-02 DIAGNOSIS — R928 Other abnormal and inconclusive findings on diagnostic imaging of breast: Secondary | ICD-10-CM | POA: Insufficient documentation

## 2019-12-08 ENCOUNTER — Other Ambulatory Visit: Payer: Self-pay

## 2019-12-08 ENCOUNTER — Encounter: Payer: Self-pay | Admitting: Internal Medicine

## 2019-12-08 ENCOUNTER — Ambulatory Visit (INDEPENDENT_AMBULATORY_CARE_PROVIDER_SITE_OTHER): Payer: BC Managed Care – PPO | Admitting: Internal Medicine

## 2019-12-08 ENCOUNTER — Other Ambulatory Visit: Payer: Self-pay | Admitting: Internal Medicine

## 2019-12-08 DIAGNOSIS — B029 Zoster without complications: Secondary | ICD-10-CM

## 2019-12-08 DIAGNOSIS — F439 Reaction to severe stress, unspecified: Secondary | ICD-10-CM

## 2019-12-08 MED ORDER — VALACYCLOVIR HCL 1 G PO TABS: 1000.0000 mg | ORAL_TABLET | Freq: Three times a day (TID) | ORAL | 0 refills | Status: DC

## 2019-12-08 NOTE — Progress Notes (Signed)
Patient ID: Morgan Moore, female   DOB: 06/14/1956, 64 y.o.   MRN: [id number]   Virtual Visit via video Note  This visit type was conducted due to national recommendations for restrictions regarding the COVID-19 pandemic (e.g. social distancing).  This format is felt to be most appropriate for this patient at this time.  All issues noted in this document were discussed and addressed.  No physical exam was performed (except for noted visual exam findings with Video Visits).   I connected with Oswaldo Done by a video enabled telemedicine application and verified that I am speaking with the correct person using two identifiers. Location patient: home Location provider: work Persons participating in the virtual visit: patient, provider  the limitations, risks, security and privacy concerns of performing an evaluation and management service by video and the availability of in person appointments have been discussed.  The patient expressed understanding and agreed to proceed.   Reason for visit: work in appt  HPI: Work in appt with concerns regarding possible shingles.  She reports that she noticed pain under her right arm/soreness.  Noticed deep burning.  Hurts under arm and around to back.  Small cluster of lesion - posterior right back.  Started with one blister and now small cluster.  Hurts to push on area.  Some burning.  No fever.  No cough or congestion.  No chest pain or sob.  Handling stress.     ROS: See pertinent positives and negatives per HPI.  Past Medical History:  Diagnosis Date  . Allergy   . Anxiety   . Chicken pox   . Complication of anesthesia    vomitting  . Coronary artery disease   . Crohn disease ([place])   . DVT (deep venous thrombosis) ([place]) 2017  . Dyspnea    on excertion  . Dysrhythmia 2019   palpitations  . GERD (gastroesophageal reflux disease)   . Heart disease   . Heart murmur   . Heart murmur   . Hyperlipidemia   . Hypertension   .  Inflammatory bowel disease   . Migraines    hormonal, puberty  . Myocardial infarction ([place])   . Nephrolithiasis   . Phlebitis     Past Surgical History:  Procedure Laterality Date  . BREAST CYST ASPIRATION     unsure of side  . CARPAL TUNNEL RELEASE Right 12/02/2017   Procedure: CARPAL TUNNEL RELEASE ENDOSCOPIC;  Surgeon: Corky Mull, MD;  Location: [facility] ORS;  Service: Orthopedics;  Laterality: Right;  . CORONARY ANGIOPLASTY WITH STENT PLACEMENT  2017   x 2  . EYE SURGERY Bilateral    cataract surgery  . GANGLION CYST EXCISION Right 12/02/2017   Procedure: REMOVAL GANGLION OF WRIST;  Surgeon: Corky Mull, MD;  Location: [facility] ORS;  Service: Orthopedics;  Laterality: Right;  . heart murmur    . [place]  . TUBAL LIGATION  1993  . tubaligation  1990    Family History  Problem Relation Age of Onset  . Heart disease Father   . Heart disease Brother   . Cancer Maternal Aunt   . Breast cancer Maternal Aunt   . Stroke Mother   . Kidney disease Neg Hx   . GU problems Neg Hx   . Kidney cancer Neg Hx     SOCIAL HX: reviewed.    Current Outpatient Medications:  .  aspirin EC 81 MG tablet, Take 81 mg by mouth daily., Disp: , Rfl:  .  carvedilol (COREG) 3.125 MG tablet, Take 1.5625 mg by mouth 2 (two) times daily with a meal. , Disp: , Rfl:  .  Cholecalciferol 25 MCG (1000 UT) tablet, Take 1,000 Units by mouth 2 (two) times daily., Disp: , Rfl:  .  clopidogrel (PLAVIX) 75 MG tablet, Take 75 mg by mouth daily., Disp: , Rfl:  .  cyanocobalamin 1000 MCG tablet, Take 1,000 mcg by mouth daily., Disp: , Rfl:  .  EPINEPHrine 0.3 mg/0.3 mL IJ SOAJ injection, Inject 0.3 mg into the muscle once. , Disp: , Rfl:  .  escitalopram (LEXAPRO) 20 MG tablet, TAKE 1 TABLET BY MOUTH ONCE DAILY., Disp: 90 tablet, Rfl: 0 .  Melatonin-Pyridoxine 3-10 MG TABS, Take 1 tablet by mouth at bedtime., Disp: , Rfl:  .  mesalamine (LIALDA) 1.2 g EC tablet, Take 1.2 g by mouth 2  (two) times daily. , Disp: , Rfl:  .  metroNIDAZOLE (METROGEL) 1 % gel, Apply topically 2 (two) times daily as needed., Disp: 45 g, Rfl: 0 .  nitroGLYCERIN (NITROSTAT) 0.4 MG SL tablet, DISSOLVE (1) TABLET UNDER TONGUE AS NEEDED TO RELIEVE CHEST PAIN. MAYREPEAT EVERY 5 MINUTES., Disp: 25 tablet, Rfl: 0 .  ondansetron (ZOFRAN-ODT) 4 MG disintegrating tablet, , Disp: , Rfl:  .  pantoprazole (PROTONIX) 20 MG tablet, Take by mouth., Disp: , Rfl:  .  prednisoLONE acetate (PRED FORTE) 1 % ophthalmic suspension, Place 1 drop into both eyes 2 (two) times daily as needed (for inflammation)., Disp: , Rfl:  .  rizatriptan (MAXALT) 5 MG tablet, USE AS DIRECTED (Patient taking differently: Take 5 mg by mouth as needed for migraine), Disp: 10 tablet, Rfl: 0 .  rosuvastatin (CRESTOR) 5 MG tablet, TAKE 1 TABLET BY MOUTH ONCE DAILY., Disp: 30 tablet, Rfl: 0 .  verapamil (CALAN-SR) 120 MG CR tablet, Take 120 mg by mouth at bedtime., Disp: , Rfl:  .  Fe Fum-FePoly-Vit C-Vit B3 (INTEGRA) 62.5-62.5-40-3 MG CAPS, TAKE (1) CAPSULE BY MOUTH ONCE DAILY., Disp: 90 capsule, Rfl: 0 .  valACYclovir (VALTREX) 1000 MG tablet, Take 1 tablet (1,000 mg total) by mouth 3 (three) times daily., Disp: 21 tablet, Rfl: 0  EXAM:  GENERAL: alert, oriented, appears well and in no acute distress  HEENT: atraumatic, conjunttiva clear, no obvious abnormalities on inspection of external nose and ears  NECK: normal movements of the head and neck  LUNGS: on inspection no signs of respiratory distress, breathing rate appears normal, no obvious gross SOB, gasping or wheezing  CV: no obvious cyanosis  MS: moves all visible extremities without noticeable abnormality  SKIN:  Small cluster - vesicles.  Minimal erythema.   PSYCH/NEURO: pleasant and cooperative, no obvious depression or anxiety, speech and thought processing grossly intact  ASSESSMENT AND PLAN:  Discussed the following assessment and plan:  Shingles Pain and rash appears  to be c/w shingles.  Treat with valtrex as outlined.  Tylenol if needed for pain.  Does not feel needs any further medication.  Follow.    Stress Handling stress. Discussed with her today.  Follow.     Meds ordered this encounter  Medications  . valACYclovir (VALTREX) 1000 MG tablet    Sig: Take 1 tablet (1,000 mg total) by mouth 3 (three) times daily.    Dispense:  21 tablet    Refill:  0     I discussed the assessment and treatment plan with the patient. The patient was provided an opportunity to ask questions and all were answered. The patient agreed  with the plan and demonstrated an understanding of the instructions.   The patient was advised to call back or seek an in-person evaluation if the symptoms worsen or if the condition fails to improve as anticipated.   Einar Pheasant, MD

## 2019-12-10 ENCOUNTER — Encounter: Payer: Self-pay | Admitting: Internal Medicine

## 2019-12-10 DIAGNOSIS — B029 Zoster without complications: Secondary | ICD-10-CM | POA: Insufficient documentation

## 2019-12-10 NOTE — Assessment & Plan Note (Signed)
Pain and rash appears to be c/w shingles.  Treat with valtrex as outlined.  Tylenol if needed for pain.  Does not feel needs any further medication.  Follow.

## 2019-12-10 NOTE — Assessment & Plan Note (Signed)
Handling stress. Discussed with her today.  Follow.

## 2020-01-08 ENCOUNTER — Ambulatory Visit
Admission: RE | Admit: 2020-01-08 | Discharge: 2020-01-08 | Disposition: A | Discharge status: Home / Self Care | Payer: BC Managed Care – PPO | Source: Ambulatory Visit | Attending: Internal Medicine | Admitting: Internal Medicine

## 2020-01-08 DIAGNOSIS — R928 Other abnormal and inconclusive findings on diagnostic imaging of breast: Secondary | ICD-10-CM | POA: Insufficient documentation

## 2020-01-09 ENCOUNTER — Other Ambulatory Visit: Payer: Self-pay | Admitting: Internal Medicine

## 2020-01-09 DIAGNOSIS — Z1231 Encounter for screening mammogram for malignant neoplasm of breast: Secondary | ICD-10-CM

## 2020-01-09 NOTE — Progress Notes (Signed)
Order placed for screening mammogram

## 2020-02-20 ENCOUNTER — Encounter: Payer: Self-pay | Admitting: Internal Medicine

## 2020-03-11 ENCOUNTER — Other Ambulatory Visit: Payer: Self-pay

## 2020-03-11 ENCOUNTER — Other Ambulatory Visit (INDEPENDENT_AMBULATORY_CARE_PROVIDER_SITE_OTHER): Payer: Medicare PPO

## 2020-03-11 DIAGNOSIS — D509 Iron deficiency anemia, unspecified: Secondary | ICD-10-CM

## 2020-03-11 DIAGNOSIS — E7800 Pure hypercholesterolemia, unspecified: Secondary | ICD-10-CM

## 2020-03-11 DIAGNOSIS — I10 Essential (primary) hypertension: Secondary | ICD-10-CM | POA: Diagnosis not present

## 2020-03-11 LAB — CBC WITH DIFFERENTIAL/PLATELET
Basophils Absolute: 0.1 10*3/uL (ref 0.0–0.1)
Basophils Relative: 0.8 % (ref 0.0–3.0)
Eosinophils Absolute: 0.2 10*3/uL (ref 0.0–0.7)
Eosinophils Relative: 3 % (ref 0.0–5.0)
HCT: 40.5 % (ref 36.0–46.0)
Hemoglobin: 13.6 g/dL (ref 12.0–15.0)
Lymphocytes Relative: 20.8 % (ref 12.0–46.0)
Lymphs Abs: 1.6 10*3/uL (ref 0.7–4.0)
MCHC: 33.7 g/dL (ref 30.0–36.0)
MCV: 96.3 fl (ref 78.0–100.0)
Monocytes Absolute: 0.7 10*3/uL (ref 0.1–1.0)
Monocytes Relative: 9.3 % (ref 3.0–12.0)
Neutro Abs: 5 10*3/uL (ref 1.4–7.7)
Neutrophils Relative %: 66.1 % (ref 43.0–77.0)
Platelets: 305 10*3/uL (ref 150.0–400.0)
RBC: 4.21 Mil/uL (ref 3.87–5.11)
RDW: 14.1 % (ref 11.5–15.5)
WBC: 7.5 10*3/uL (ref 4.0–10.5)

## 2020-03-11 LAB — BASIC METABOLIC PANEL
BUN: 16 mg/dL (ref 6–23)
CO2: 27 mEq/L (ref 19–32)
Calcium: 9.5 mg/dL (ref 8.4–10.5)
Chloride: 104 mEq/L (ref 96–112)
Creatinine, Ser: 0.82 mg/dL (ref 0.40–1.20)
GFR: 69.95 mL/min (ref >60.00)
Glucose, Bld: 100 mg/dL — ABNORMAL HIGH (ref 70–99)
Potassium: 4 mEq/L (ref 3.5–5.1)
Sodium: 138 mEq/L (ref 135–145)

## 2020-03-11 LAB — LIPID PANEL
Cholesterol: 193 mg/dL (ref 0–200)
HDL: 48.7 mg/dL (ref >39.00)
LDL Cholesterol: 114 mg/dL — ABNORMAL HIGH (ref 0–99)
NonHDL: 144.71
Total CHOL/HDL Ratio: 4
Triglycerides: 153 mg/dL — ABNORMAL HIGH (ref 0.0–149.0)
VLDL: 30.6 mg/dL (ref 0.0–40.0)

## 2020-03-11 LAB — HEPATIC FUNCTION PANEL
ALT: 24 U/L (ref 0–35)
AST: 21 U/L (ref 0–37)
Albumin: 4.2 g/dL (ref 3.5–5.2)
Alkaline Phosphatase: 95 U/L (ref 39–117)
Bilirubin, Direct: 0.1 mg/dL (ref 0.0–0.3)
Total Bilirubin: 0.5 mg/dL (ref 0.2–1.2)
Total Protein: 6.7 g/dL (ref 6.0–8.3)

## 2020-03-11 LAB — FERRITIN: Ferritin: 53.3 ng/mL (ref 10.0–291.0)

## 2020-03-13 ENCOUNTER — Other Ambulatory Visit: Payer: Self-pay

## 2020-03-13 ENCOUNTER — Ambulatory Visit (INDEPENDENT_AMBULATORY_CARE_PROVIDER_SITE_OTHER): Payer: Medicare PPO | Admitting: Internal Medicine

## 2020-03-13 ENCOUNTER — Encounter: Payer: Self-pay | Admitting: Internal Medicine

## 2020-03-13 VITALS — BP 138/78 | HR 63 | Temp 97.4°F | Resp 16 | Ht 63.0 in | Wt 173.2 lb

## 2020-03-13 DIAGNOSIS — E559 Vitamin D deficiency, unspecified: Secondary | ICD-10-CM

## 2020-03-13 DIAGNOSIS — I2510 Atherosclerotic heart disease of native coronary artery without angina pectoris: Secondary | ICD-10-CM

## 2020-03-13 DIAGNOSIS — I10 Essential (primary) hypertension: Secondary | ICD-10-CM

## 2020-03-13 DIAGNOSIS — F320 Major depressive disorder, single episode, mild: Secondary | ICD-10-CM

## 2020-03-13 DIAGNOSIS — K50919 Crohn's disease, unspecified, with unspecified complications: Secondary | ICD-10-CM

## 2020-03-13 DIAGNOSIS — R9389 Abnormal findings on diagnostic imaging of other specified body structures: Secondary | ICD-10-CM

## 2020-03-13 DIAGNOSIS — K219 Gastro-esophageal reflux disease without esophagitis: Secondary | ICD-10-CM

## 2020-03-13 DIAGNOSIS — F439 Reaction to severe stress, unspecified: Secondary | ICD-10-CM

## 2020-03-13 DIAGNOSIS — Z0000 Encounter for general adult medical examination without abnormal findings: Secondary | ICD-10-CM

## 2020-03-13 DIAGNOSIS — E7800 Pure hypercholesterolemia, unspecified: Secondary | ICD-10-CM

## 2020-03-13 DIAGNOSIS — F32A Depression, unspecified: Secondary | ICD-10-CM

## 2020-03-13 DIAGNOSIS — L989 Disorder of the skin and subcutaneous tissue, unspecified: Secondary | ICD-10-CM

## 2020-03-13 MED ORDER — MUPIROCIN 2 % EX OINT: TOPICAL_OINTMENT | CUTANEOUS | 0 refills | Status: DC

## 2020-03-13 MED ORDER — ROSUVASTATIN CALCIUM 5 MG PO TABS: ORAL_TABLET | ORAL | 0 refills | Status: DC

## 2020-03-13 NOTE — Patient Instructions (Signed)
Increase crestor to 77m two days per week and continue 523mall other days.

## 2020-03-13 NOTE — Assessment & Plan Note (Signed)
Physical today 03/13/20.  Pelvic exam - per Dr Leafy Ro.  Evaluated yesterday.  Mammogram 06/19/19 - Birads 0.  F/u left breast 01/2020 - birads II.  Due screening bilateral mammogram in 06/2020.  Schedule.  Colonoscopy 08/2018.

## 2020-03-13 NOTE — Progress Notes (Signed)
Patient ID: Morgan Moore, female   DOB: 05/11/1956, 64 y.o.   MRN: [id number]   Subjective:    Patient ID: Morgan Moore, female    DOB: Nov 26, 1955, 64 y.o.   MRN: [id number]  HPI This visit occurred during the SARS-CoV-2 public health emergency.  Safety protocols were in place, including screening questions prior to the visit, additional usage of staff PPE, and extensive cleaning of exam room while observing appropriate contact time as indicated for disinfecting solutions.  Patient here for her physical exam. She reports she is doing relatively well.  Saw Dr Leafy Ro 03/12/20 - endometrial thickening - endometrial mass.  Planning for [facility] and hysteroscopy.  Sees Dr Clayborn Bigness for f/u of her underlying heart issues.  Discussed f/u with him for pre op evaluation.  No chest pain.  Breathing stable.  Eating.  No nausea or vomiting reported.  No abdominal pain or bowel change reported.  Right lower leg lesion.  Discussed recent labs. Discussed trying to increase crestor.  Due f/u mammogram in 06/2020.     Past Medical History:  Diagnosis Date  . Allergy   . Anxiety   . Chicken pox   . Complication of anesthesia    vomitting  . Coronary artery disease   . Crohn disease ([place])   . DVT (deep venous thrombosis) ([place]) 2017  . Dyspnea    on excertion  . Dysrhythmia 2019   palpitations  . GERD (gastroesophageal reflux disease)   . Heart disease   . Heart murmur   . Heart murmur   . Hyperlipidemia   . Hypertension   . Inflammatory bowel disease   . Migraines    hormonal, puberty  . Myocardial infarction ([place])   . Nephrolithiasis   . Phlebitis    Past Surgical History:  Procedure Laterality Date  . BREAST BIOPSY Left 07/05/2019   FIBROEPITHELIAL lesion/neg  . BREAST CYST ASPIRATION     unsure of side  . CARPAL TUNNEL RELEASE Right 12/02/2017   Procedure: CARPAL TUNNEL RELEASE ENDOSCOPIC;  Surgeon: Corky Mull, MD;  Location: [facility] ORS;  Service: Orthopedics;  Laterality: Right;   . CORONARY ANGIOPLASTY WITH STENT PLACEMENT  2017   x 2  . EYE SURGERY Bilateral    cataract surgery  . GANGLION CYST EXCISION Right 12/02/2017   Procedure: REMOVAL GANGLION OF WRIST;  Surgeon: Corky Mull, MD;  Location: [facility] ORS;  Service: Orthopedics;  Laterality: Right;  . heart murmur    . [place]  . TUBAL LIGATION  1993  . tubaligation  1990   Family History  Problem Relation Age of Onset  . Heart disease Father   . Heart disease Brother   . Cancer Maternal Aunt   . Breast cancer Maternal Aunt   . Stroke Mother   . Kidney disease Neg Hx   . GU problems Neg Hx   . Kidney cancer Neg Hx    Social History   Socioeconomic History  . Marital status: Single    Spouse name: Not on file  . Number of children: Not on file  . Years of education: Not on file  . Highest education level: Not on file  Occupational History  . Not on file  Tobacco Use  . Smoking status: Never Smoker  . Smokeless tobacco: Never Used  Substance and Sexual Activity  . Alcohol use: No    Alcohol/week: 0.0 standard drinks  . Drug use: No  . Sexual activity: Not on file  Other Topics Concern  . Not on file  Social History Narrative   Had 2 kids. Daughter died in 2012/01/09 son living    Former Chartered loss adjuster    Social Determinants of Radio broadcast assistant Strain:   . Difficulty of Paying Living Expenses:   Food Insecurity:   . Worried About Charity fundraiser in the Last Year:   . Arboriculturist in the Last Year:   Transportation Needs:   . Film/video editor (Medical):   Marland Kitchen Lack of Transportation (Non-Medical):   Physical Activity:   . Days of Exercise per Week:   . Minutes of Exercise per Session:   Stress:   . Feeling of Stress :   Social Connections:   . Frequency of Communication with Friends and Family:   . Frequency of Social Gatherings with Friends and Family:   . Attends Religious Services:   . Active Member of Clubs or Organizations:    . Attends Archivist Meetings:   Marland Kitchen Marital Status:     Outpatient Encounter Medications as of 03/13/2020  Medication Sig  . aspirin EC 81 MG tablet Take 81 mg by mouth daily.  . carvedilol (COREG) 3.125 MG tablet Take 1.5625 mg by mouth 2 (two) times daily with a meal.   . Cholecalciferol 25 MCG (1000 UT) tablet Take 1,000 Units by mouth 2 (two) times daily.  . clopidogrel (PLAVIX) 75 MG tablet Take 75 mg by mouth daily.  . cyanocobalamin 1000 MCG tablet Take 1,000 mcg by mouth daily.  Marland Kitchen EPINEPHrine 0.3 mg/0.3 mL IJ SOAJ injection Inject 0.3 mg into the muscle once.   . escitalopram (LEXAPRO) 20 MG tablet TAKE 1 TABLET BY MOUTH ONCE DAILY.  Marland Kitchen Fe Fum-FePoly-Vit C-Vit B3 (INTEGRA) 62.5-62.5-40-3 MG CAPS TAKE (1) CAPSULE BY MOUTH ONCE DAILY.  . Melatonin-Pyridoxine 3-10 MG TABS Take 1 tablet by mouth at bedtime.  . mesalamine (LIALDA) 1.2 g EC tablet Take 1.2 g by mouth 2 (two) times daily.   . metroNIDAZOLE (METROGEL) 1 % gel Apply topically 2 (two) times daily as needed.  . mupirocin ointment (BACTROBAN) 2 % Apply to affected area bid  . nitroGLYCERIN (NITROSTAT) 0.4 MG SL tablet DISSOLVE (1) TABLET UNDER TONGUE AS NEEDED TO RELIEVE CHEST PAIN. MAYREPEAT EVERY 5 MINUTES.  Marland Kitchen ondansetron (ZOFRAN-ODT) 4 MG disintegrating tablet   . pantoprazole (PROTONIX) 20 MG tablet Take by mouth.  . prednisoLONE acetate (PRED FORTE) 1 % ophthalmic suspension Place 1 drop into both eyes 2 (two) times daily as needed (for inflammation).  . rizatriptan (MAXALT) 5 MG tablet USE AS DIRECTED (Patient taking differently: Take 5 mg by mouth as needed for migraine)  . rosuvastatin (CRESTOR) 5 MG tablet Take 10 mg by mouth 2 days per week and take 5 mg by mouth all other days.  . valACYclovir (VALTREX) 1000 MG tablet Take 1 tablet (1,000 mg total) by mouth 3 (three) times daily.  . verapamil (CALAN-SR) 120 MG CR tablet Take 120 mg by mouth at bedtime.  . [DISCONTINUED] rosuvastatin (CRESTOR) 5 MG tablet TAKE  1 TABLET BY MOUTH ONCE DAILY.   No facility-administered encounter medications on file as of 03/13/2020.   Review of Systems  Constitutional: Negative for appetite change and unexpected weight change.  HENT: Negative for congestion and sinus pressure.   Respiratory: Negative for cough, chest tightness and shortness of breath.   Cardiovascular: Negative for chest pain and palpitations.  Gastrointestinal: Negative for abdominal pain, diarrhea, nausea and  vomiting.  Genitourinary: Negative for difficulty urinating and dysuria.  Musculoskeletal: Negative for joint swelling and myalgias.  Skin: Negative for color change and rash.  Neurological: Negative for dizziness, light-headedness and headaches.  Psychiatric/Behavioral: Negative for agitation and dysphoric mood.       Objective:    Physical Exam Vitals reviewed.  Constitutional:      General: She is not in acute distress.    Appearance: Normal appearance. She is well-developed.  HENT:     Head: Normocephalic and atraumatic.     Right Ear: External ear normal.     Left Ear: External ear normal.  Eyes:     General: No scleral icterus.       Right eye: No discharge.        Left eye: No discharge.     Conjunctiva/sclera: Conjunctivae normal.  Neck:     Thyroid: No thyromegaly.  Cardiovascular:     Rate and Rhythm: Normal rate and regular rhythm.  Pulmonary:     Effort: No tachypnea, accessory muscle usage or respiratory distress.     Breath sounds: Normal breath sounds. No decreased breath sounds or wheezing.  Chest:     Breasts:        Right: No inverted nipple, mass, nipple discharge or tenderness (no axillary adenopathy).        Left: No inverted nipple, mass, nipple discharge or tenderness (no axilarry adenopathy).  Abdominal:     General: Bowel sounds are normal.     Palpations: Abdomen is soft.     Tenderness: There is no abdominal tenderness.  Musculoskeletal:        General: No swelling or tenderness.     Cervical  back: Neck supple. No tenderness.  Lymphadenopathy:     Cervical: No cervical adenopathy.  Skin:    Findings: No erythema or rash.     Comments: Right lower leg lesion.   Neurological:     Mental Status: She is alert and oriented to person, place, and time.  Psychiatric:        Mood and Affect: Mood normal.        Behavior: Behavior normal.     BP 138/78   Pulse 63   Temp (!) 97.4 F (36.3 C)   Resp 16   Ht 5' 3"  (1.6 m)   Wt 173 lb 3.2 oz (78.6 kg)   SpO2 98%   BMI 30.68 kg/m  Wt Readings from Last 3 Encounters:  03/13/20 173 lb 3.2 oz (78.6 kg)  12/08/19 169 lb (76.7 kg)  10/25/19 167 lb (75.8 kg)     Lab Results  Component Value Date   WBC 7.5 03/11/2020   HGB 13.6 03/11/2020   HCT 40.5 03/11/2020   PLT 305.0 03/11/2020   GLUCOSE 100 (H) 03/11/2020   CHOL 193 03/11/2020   TRIG 153.0 (H) 03/11/2020   HDL 48.70 03/11/2020   LDLCALC 114 (H) 03/11/2020   ALT 24 03/11/2020   AST 21 03/11/2020   NA 138 03/11/2020   K 4.0 03/11/2020   CL 104 03/11/2020   CREATININE 0.82 03/11/2020   BUN 16 03/11/2020   CO2 27 03/11/2020   TSH 3.45 10/23/2019   INR 1.0 12/23/2017    US BREAST LTD UNI LEFT INC AXILLA  Result Date: 01/08/2020 CLINICAL DATA:  64 year old patient had a left breast biopsy of a mass at 10 o'clock position in August 2020, with benign pathology results showing a benign fibroepithelial lesion. Six-month follow-up diagnostic mammogram and left breast  ultrasound were recommended. Patient is asymptomatic. EXAM: DIGITAL DIAGNOSTIC LEFT MAMMOGRAM WITH CAD AND TOMO ULTRASOUND LEFT BREAST COMPARISON:  Previous exam(s). ACR Breast Density Category c: The breast tissue is heterogeneously dense, which may obscure small masses. FINDINGS: Ribbon shaped biopsy clip remains stable position in the upper inner quadrant of the left breast. The mass is best visualized on ultrasound. The no suspicious mass, distortion, or suspicious microcalcification in the left breast.  Mammographic images were processed with CAD. Targeted ultrasound is performed, showing a parallel circumscribed homogeneously hypoechoic oval mass at 10 o'clock position 5 cm from nipple measuring 0.9 x 1.0 x 0.5 cm. Internal biopsy clip artifact is noted. Previously in August 2020 this mass was measured to be 0.9 x 1.1 x 0.5 cm. It remains stable in size and appearance. IMPRESSION: Stable appearance of a biopsy-proven benign fibroepithelial lesion left breast 10 o'clock position. No evidence of malignancy in the left breast. RECOMMENDATION: Bilateral screening mammogram is recommended in August 2021. I have discussed the findings and recommendations with the patient. If applicable, a reminder letter will be sent to the patient regarding the next appointment. BI-RADS CATEGORY  2: Benign. Electronically Signed   By: Curlene Dolphin M.D.   On: 01/08/2020 10:26   MM DIAG BREAST TOMO UNI LEFT  Result Date: 01/08/2020 CLINICAL DATA:  64 year old patient had a left breast biopsy of a mass at 10 o'clock position in August 2020, with benign pathology results showing a benign fibroepithelial lesion. Six-month follow-up diagnostic mammogram and left breast ultrasound were recommended. Patient is asymptomatic. EXAM: DIGITAL DIAGNOSTIC LEFT MAMMOGRAM WITH CAD AND TOMO ULTRASOUND LEFT BREAST COMPARISON:  Previous exam(s). ACR Breast Density Category c: The breast tissue is heterogeneously dense, which may obscure small masses. FINDINGS: Ribbon shaped biopsy clip remains stable position in the upper inner quadrant of the left breast. The mass is best visualized on ultrasound. The no suspicious mass, distortion, or suspicious microcalcification in the left breast. Mammographic images were processed with CAD. Targeted ultrasound is performed, showing a parallel circumscribed homogeneously hypoechoic oval mass at 10 o'clock position 5 cm from nipple measuring 0.9 x 1.0 x 0.5 cm. Internal biopsy clip artifact is noted. Previously in  August 2020 this mass was measured to be 0.9 x 1.1 x 0.5 cm. It remains stable in size and appearance. IMPRESSION: Stable appearance of a biopsy-proven benign fibroepithelial lesion left breast 10 o'clock position. No evidence of malignancy in the left breast. RECOMMENDATION: Bilateral screening mammogram is recommended in August 2021. I have discussed the findings and recommendations with the patient. If applicable, a reminder letter will be sent to the patient regarding the next appointment. BI-RADS CATEGORY  2: Benign. Electronically Signed   By: Curlene Dolphin M.D.   On: 01/08/2020 10:26       Assessment & Plan:   Problem List Items Addressed This Visit    CAD (coronary artery disease)    Sees cardiology.  On crestor.  Currently stable.  Will need pre op cardiology evaluation.        Relevant Medications   rosuvastatin (CRESTOR) 5 MG tablet   Crohn disease (HCC)    Bowels stable.  Followed by GI.        Essential hypertension    Blood pressure doing well.  Continue coreg. Follow.        Relevant Medications   rosuvastatin (CRESTOR) 5 MG tablet   Other Relevant Orders   Basic metabolic panel   GERD (gastroesophageal reflux disease)    Controlled  on protonix.        Health care maintenance    Physical today 03/13/20.  Pelvic exam - per Dr Leafy Ro.  Evaluated yesterday.  Mammogram 06/19/19 - Birads 0.  F/u left breast 01/2020 - birads II.  Due screening bilateral mammogram in 06/2020.  Schedule.  Colonoscopy 08/2018.        Hypercholesterolemia    Discussed recent labs.  On crestor.  Increased to 30m 2 days per week and continue 522mall other days.  Low cholesterol diet and exercise.  Follow lipid panel and liver function tests.        Relevant Medications   rosuvastatin (CRESTOR) 5 MG tablet   Other Relevant Orders   Hepatic function panel   Lipid panel   Leg skin lesion, right    Bactroban.  Notify me if persistent.        Mild depression ([place]   On lexapro.  Appears to  be doing well.  Has good support.  Started a book mobile.  Follow.        Stress    Overall appears to be handling things well.  Has started a book mobile.  Stays active.  Follow.        Thickened endometrium    [facility] Dr [facility]  Plans to see cardiology for cardiac clearance.        Vitamin D deficiency    Follow vitamin D level.           ChEinar PheasantMD

## 2020-03-21 ENCOUNTER — Other Ambulatory Visit: Payer: Self-pay | Admitting: Obstetrics and Gynecology

## 2020-03-24 ENCOUNTER — Encounter: Payer: Self-pay | Admitting: Internal Medicine

## 2020-03-24 DIAGNOSIS — L989 Disorder of the skin and subcutaneous tissue, unspecified: Secondary | ICD-10-CM | POA: Insufficient documentation

## 2020-03-24 NOTE — Assessment & Plan Note (Signed)
Follow vitamin D level.

## 2020-03-24 NOTE — Assessment & Plan Note (Signed)
Blood pressure doing well.  Continue coreg. Follow.

## 2020-03-24 NOTE — Assessment & Plan Note (Signed)
Seeing Dr Leafy Ro.  Planning for [facility] and hysteroscopy.  Plans to see cardiology for cardiac clearance.

## 2020-03-24 NOTE — Assessment & Plan Note (Signed)
Discussed recent labs.  On crestor.  Increased to 2m 2 days per week and continue 511mall other days.  Low cholesterol diet and exercise.  Follow lipid panel and liver function tests.

## 2020-03-24 NOTE — Assessment & Plan Note (Signed)
Overall appears to be handling things well.  Has started a book mobile.  Stays active.  Follow.

## 2020-03-24 NOTE — Assessment & Plan Note (Signed)
Bactroban.  Notify me if persistent.

## 2020-03-24 NOTE — Assessment & Plan Note (Signed)
On lexapro.  Appears to be doing well.  Has good support.  Started a book mobile.  Follow.

## 2020-03-24 NOTE — Assessment & Plan Note (Signed)
Sees cardiology.  On crestor.  Currently stable.  Will need pre op cardiology evaluation.

## 2020-03-24 NOTE — Assessment & Plan Note (Signed)
Controlled on protonix.   

## 2020-03-24 NOTE — Assessment & Plan Note (Signed)
Bowels stable.  Followed by GI.

## 2020-04-04 NOTE — H&P (Signed)
Morgan Moore is a 64 y.o. female here for Pre Op Consulting  History of Present Illness: Patient returns for a preoperative visit for a D&C Hysteroscopy and Possible Polypectomy. Indications for this surgery are her thickened endometrial stripe and endometrial mass/possible polyp,butwithout postmenopausal bleeding present.   Her pelvic US in 02/2019 incidentally showed a thickened endometrium of 9.37m with an endometrial mass/possible polyp, EMBx benign. Repeat [place] 02[place] showed thickened endometrium of 9.223mand endometrial mass still present as well, see [place].  She also had pelvic organ prolapse with bladder symptoms present. She wonders if she should go ahead with an anterior repair or not.  Workup: Pap: 03/2020 neg/neg EMBx 02/2019: Atrophic endometrium. Benign endocervical tissue. TVUS 02/2020:  Uterus anteverted Uterus=5.90 x 2.81 x 4.05cm Thickened endometrium=9.7m77mndometrial mass=1.20 x 1.00 x 1.27cm No free fluid seen B/L ovaries appear wnl  Pertinent Hx: -Rectal Mass Resection 10/31/2018, rectal bleeding after surgery resolved then had BRBPR  -Hx of DVT, with recurrent thrombophlebitis associated with pregnancy  -Hx of BTL (only pelvic surgery) -Hx of Crohn's disease  Past Medical History:  has a past medical history of Acute myocardial infarction of anterolateral wall, initial episode of care (CMS-HCC), Anemia, Arrhythmia, Arthritis, CATARACT, Coronary artery disease, Episcleritis, Erythema nodosum, GERD (gastroesophageal reflux disease), Grief at loss of child (2011), H/O supraventricular tachycardia, Heart murmur, History of Crohn's disease (diagnosed 1999), History of DVT (deep vein thrombosis), Hypertension, Migraines, OSTEOPOROSIS, Other and unspecified hyperlipidemia (05/29/2011), P-ANCA and MPO antibodies positive, Phosphate kidney stone, PONV (postoperative nausea and vomiting), and Shoulder fracture.  Past Surgical History:  has a past  surgical history that includes Tubal ligation; Tonsillectomy; Cataract extraction (2013); colonoscopy w/biopsy (04/28/2013); esophagogastrodoudenoscopy w/biopsy (N/A, 04/26/2015); cardiac stents; Endoscopic right carpal tunnel release, flexor tenosynovectomy,right wrist (Right, 12/02/2017); colonoscopy w/biopsy (N/A, 08/26/2018); excision rectal tumor transanal (N/A, 10/31/2018); anorectal exam (N/A, 10/31/2018); colonoscopy w/biopsy (N/A, 10/20/2019); and Breast biopsy (06/2019). Family History: family history includes Coronary Artery Disease (Blocked arteries around heart) in her brother and another family member; Heart disease in her father; High blood pressure (Hypertension) in her mother; Stroke in her mother. Social History:  reports that she has never smoked. She has never used smokeless tobacco. She reports that she does not drink alcohol and does not use drugs. OB/GYN History:          OB History    Gravida  2   Para  2   Term  0   Preterm      AB      Living  2     SAB      TAB      Ectopic      Molar      Multiple      Live Births           Allergies: is allergic to benzonatate, keflex [cephalexin], lisinopril, meclizine, sulfabenzamide (bulk), sulfamethoxazole-trimethoprim, ciprofloxacin hcl, fosfomycin tromethamine, and codeine. Medications:  Current Outpatient Medications:  .  aspirin 81 MG EC tablet, Take 1 tablet (81 mg total) by mouth every morning Last dose 2 days prior to surgery, Disp: , Rfl:  .  carvediloL (COREG) 3.125 MG tablet, Take 1 tablet (3.125 mg total) by mouth 2 (two) times daily with meals, Disp: 90 tablet, Rfl: 3 .  cholecalciferol (CHOLECALCIFEROL) 1000 unit tablet, Take by mouth 2 (two) times daily   , Disp: , Rfl:  .  clopidogreL (PLAVIX) 75 mg tablet, Take 1 tablet (75 mg total) by mouth once daily,  Disp: 90 tablet, Rfl: 3 .  cyanocobalamin (VITAMIN B12) 1000 MCG tablet, Take 1,000 mcg by mouth once daily, Disp: , Rfl:  .   diphenhydrAMINE (BENADRYL ALLERGY) 12.5 mg/5 mL solution, Take 12.5 mg by mouth every 6 (six) hours as needed for Allergies.  , Disp: , Rfl:  .  EPINEPHrine (EPIPEN) 0.3 mg/0.3 mL pen injector, Inject 0.3 mg into the muscle once as needed for Anaphylaxis.  , Disp: , Rfl:  .  escitalopram oxalate (LEXAPRO) 20 MG tablet, Take 1 tablet (20 mg total) by mouth nightly, Disp: 90 tablet, Rfl: 3 .  INTEGRA 125-40-3 mg Cap, Take 1 capsule by mouth 3 (three) times a week   , Disp: , Rfl:  .  KETOTIFEN FUMARATE (ZYRTEC ITCHY EYE DROPS, KETO, OPHTH), Apply 1 drop to eye once daily as needed (itchy eyes)., Disp: , Rfl:  .  melatonin-pyridoxine HCl, B6, 3-10 mg Tab, Take 1 tablet by mouth every evening   , Disp: , Rfl:  .  mesalamine (LIALDA) 1.2 gram EC tablet, TAKE 4 TABLET BY MOUTH IN THE MORNING WITH BREAKFAST., Disp: 360 tablet, Rfl: 3 .  metroNIDAZOLE (METROGEL) 1 % gel, Apply topically as needed   , Disp: , Rfl:  .  ondansetron (ZOFRAN-ODT) 4 MG disintegrating tablet, Take 1 tablet (4 mg total) by mouth every 8 (eight) hours as needed for Nausea., Disp: 20 tablet, Rfl: 1 .  pantoprazole (PROTONIX) 20 MG DR tablet, Take 1 tablet (20 mg total) by mouth once daily, Disp: 90 tablet, Rfl: 3 .  prednisoLONE acetate (PRED FORTE) 1 % ophthalmic suspension, Place 1 drop into both eyes as needed. , Disp: , Rfl:  .  rosuvastatin (CRESTOR) 5 MG tablet, Take 1 tablet (5 mg total) by mouth every morning, Disp: 90 tablet, Rfl: 3 .  verapamiL (VERELAN) 120 MG SR capsule, Take 1 capsule (120 mg total) by mouth once daily, Disp: 90 capsule, Rfl: 3   Exam:   BP 141/66   Pulse 64   Ht 160 cm (5' 3" )   Wt 77.5 kg (170 lb 12.8 oz)   BMI 30.26 kg/m   General: Patient is well-groomed, well-nourished, appears stated age in no acute distress  HEENT: head is atraumatic and normocephalic, trachea is midline, neck is supple with no palpable nodules  CV: Regular rhythm and normal heart rate, no murmur  Pulm: Clear to  auscultation throughout lung fields with no wheezing, crackles, or rhonchi. No increased work of breathing  Abdomen: soft , no mass, non-tender, no rebound tenderness, no hepatomegaly  Pelvic: deferred  Impression:   The primary encounter diagnosis was Pre-operative clearance. Diagnoses of Endometrial mass and Endometrial thickening on ultrasound were also pertinent to this visit.  Plan:   1. Preoperative visit: D&C hysteroscopy, Possible Polypectomy. Consents signed today.  -Indications are: endometrial thickening, endometrial Mass -Risks of surgery were discussed with the patient including but not limited to: bleeding which may require transfusion; infection which may require antibiotics; injury to uterus or surrounding organs; intrauterine scarring which may impair future fertility; need for additional procedures including laparotomy or laparoscopy; and other postoperative/anesthesia complications. Written informed consent was obtained.  -Hx of heavy bleeding on anticoagulants             -She was cleared by cardio on 03/21/20; per note, "Instructed the patient to hold her aspirin and Plavix 1 week prior to procedure(approximately April 12, 2020)and to not restart until 1 week after her procedure (approximately April 27, 2020)."  Specific  Peri-operative Considerations: - Consent: obtained today - Health Maintenance:na - Labs: CBC, CMP preoperatively - Studies: EKG, CXR preoperatively - Bowel Preparation: None required - QQV:ZDGL - VTE ppx: SCDs perioperatively - Glucose Protocol:n/a - Beta-blockade:n/a  Of note: -Patient worried about multiple allergies and prior nausea with anesthesia, but states last several colonoscopies have worked well.  -Some anesthetics make patient nauseous but not the ones that she has had in her last few colonoscopies. -She uses compression stockings and mentions this today for me to keep in mind -Bleeding can be heavy, will come off  coagulation ASA and clopidogrel   Diagnoses and all orders for this visit:  Pre-operative clearance  Endometrial mass  Endometrial thickening on ultrasound

## 2020-04-09 ENCOUNTER — Other Ambulatory Visit: Payer: Self-pay

## 2020-04-09 ENCOUNTER — Encounter
Admission: RE | Admit: 2020-04-09 | Discharge: 2020-04-09 | Disposition: A | Discharge status: Home / Self Care | Payer: Medicare PPO | Source: Ambulatory Visit | Attending: Obstetrics and Gynecology | Admitting: Obstetrics and Gynecology

## 2020-04-09 ENCOUNTER — Encounter: Payer: Self-pay | Admitting: Obstetrics and Gynecology

## 2020-04-09 NOTE — Patient Instructions (Signed)
Your procedure is scheduled on: Friday 6/11 Report to Day Surgery. To find out your arrival time please call [phone] between 1PM - 3PM on .Thurs.6/10  Remember: Instructions that are not followed completely may result in serious medical risk,  up to and including death, or upon the discretion of your surgeon and anesthesiologist your  surgery may need to be rescheduled.     _X__ 1. Do not eat food after midnight the night before your procedure.                 No gum chewing or hard candies. You may drink clear liquids up to 2 hours                 before you are scheduled to arrive for your surgery- DO not drink clear                 liquids within 2 hours of the start of your surgery.                 Clear Liquids include:  water, apple juice without pulp, clear Gatorade, G2 or                  Gatorade Zero (avoid Red/Purple/Blue), Black Coffee or Tea (Do not add                 anything to coffee or tea). __x___2.   Complete the carbohydrate drink provided to you, 2 hours before arrival.  __X__2.  On the morning of surgery brush your teeth with toothpaste and water, you                may rinse your mouth with mouthwash if you wish.  Do not swallow any toothpaste of mouthwash.     ___ 3.  No Alcohol for 24 hours before or after surgery.   ___ 4.  Do Not Smoke or use e-cigarettes For 24 Hours Prior to Your Surgery.                 Do not use any chewable tobacco products for at least 6 hours prior to                 Surgery.  ___  5.  Do not use any recreational drugs (marijuana, cocaine, heroin, ecstacy, MDMA or other)                For at least one week prior to your surgery.  Combination of these drugs with anesthesia                May have life threatening results.  ____  6.  Bring all medications with you on the day of surgery if instructed.   _x___  7.  Notify your doctor if there is any change in your medical condition      (cold, fever, infections).     Do not  wear jewelry, make-up, hairpins, clips or nail polish. Do not wear lotions, powders, or perfumes. You may wear deodorant. Do not shave 48 hours prior to surgery. Do not bring valuables to the hospital.    [facility] is not responsible for any belongings or valuables.  Contacts, dentures or bridgework may not be worn into surgery. Leave your suitcase in the car. After surgery it may be brought to your room. For patients admitted to the hospital, discharge time is determined by your treatment team.   Patients discharged the day  of surgery will not be allowed to drive home.   Make arrangements for someone to be with you for the first 24 hours of your Same Day Discharge.    Please read over the following fact sheets that you were given:    _x___ Take these medicines the morning of surgery with A SIP OF WATER  1. carvedilol (COREG) 3.125 MG tablet  2. mesalamine (LIALDA) 1.2 g EC tablet  3. pantoprazole (PROTONIX) 20 MG tablet  Take a dose te night before and one the morning of surgery  4.rosuvastatin (CRESTOR) 5 MG tablet  5.verapamil (CALAN-SR) 120 MG CR tablet  6.  ____ Fleet Enema (as directed)   _x___ Shower the night before and morning of surgery as directed  ____ Use Benzoyl Peroxide Gel as instructed  ____ Use inhalers on the day of surgery  ____ Stop metformin 2 days prior to surgery    ____ Take 1/2 of usual insulin dose the night before surgery. No insulin the morning          of surgery.   __x__ Stop Plavix and aspirin on 5/4 and and resume 1 week after the surgery  ____ Stop Anti-inflammatories on   __x__ Stop supplements until after surgery.  Melatonin-Pyridoxine 3-10 MG TABS on 6/4 may take an occasional dose if needed.  ____ Bring C-Pap to the hospital.

## 2020-04-15 ENCOUNTER — Other Ambulatory Visit: Payer: Medicare PPO

## 2020-04-17 ENCOUNTER — Other Ambulatory Visit: Payer: Self-pay

## 2020-04-17 ENCOUNTER — Other Ambulatory Visit
Admission: RE | Admit: 2020-04-17 | Discharge: 2020-04-17 | Disposition: A | Discharge status: Home / Self Care | Payer: Medicare PPO | Source: Ambulatory Visit | Attending: Obstetrics and Gynecology | Admitting: Obstetrics and Gynecology

## 2020-04-17 DIAGNOSIS — Z20822 Contact with and (suspected) exposure to covid-19: Secondary | ICD-10-CM | POA: Insufficient documentation

## 2020-04-17 DIAGNOSIS — Z01812 Encounter for preprocedural laboratory examination: Secondary | ICD-10-CM | POA: Diagnosis present

## 2020-04-17 LAB — TYPE AND SCREEN
ABO/RH(D): A POS
Antibody Screen: NEGATIVE

## 2020-04-17 LAB — SARS CORONAVIRUS 2 (TAT 6-24 HRS): SARS Coronavirus 2: NEGATIVE

## 2020-04-19 ENCOUNTER — Ambulatory Visit: Payer: Medicare PPO | Admitting: Registered Nurse

## 2020-04-19 ENCOUNTER — Encounter
Admission: RE | Disposition: A | Discharge status: Home / Self Care | Payer: Self-pay | Source: Home / Self Care | Attending: Obstetrics and Gynecology

## 2020-04-19 ENCOUNTER — Ambulatory Visit
Admission: RE | Admit: 2020-04-19 | Discharge: 2020-04-19 | Disposition: A | Discharge status: Home / Self Care | Payer: Medicare PPO | Attending: Obstetrics and Gynecology | Admitting: Obstetrics and Gynecology

## 2020-04-19 ENCOUNTER — Other Ambulatory Visit: Payer: Self-pay

## 2020-04-19 ENCOUNTER — Encounter: Payer: Self-pay | Admitting: Obstetrics and Gynecology

## 2020-04-19 DIAGNOSIS — I252 Old myocardial infarction: Secondary | ICD-10-CM | POA: Diagnosis not present

## 2020-04-19 DIAGNOSIS — E785 Hyperlipidemia, unspecified: Secondary | ICD-10-CM | POA: Insufficient documentation

## 2020-04-19 DIAGNOSIS — I2510 Atherosclerotic heart disease of native coronary artery without angina pectoris: Secondary | ICD-10-CM | POA: Insufficient documentation

## 2020-04-19 DIAGNOSIS — I10 Essential (primary) hypertension: Secondary | ICD-10-CM | POA: Diagnosis not present

## 2020-04-19 DIAGNOSIS — F329 Major depressive disorder, single episode, unspecified: Secondary | ICD-10-CM | POA: Insufficient documentation

## 2020-04-19 DIAGNOSIS — Z9851 Tubal ligation status: Secondary | ICD-10-CM | POA: Insufficient documentation

## 2020-04-19 DIAGNOSIS — Z79899 Other long term (current) drug therapy: Secondary | ICD-10-CM | POA: Insufficient documentation

## 2020-04-19 DIAGNOSIS — Z7902 Long term (current) use of antithrombotics/antiplatelets: Secondary | ICD-10-CM | POA: Insufficient documentation

## 2020-04-19 DIAGNOSIS — Z86718 Personal history of other venous thrombosis and embolism: Secondary | ICD-10-CM | POA: Diagnosis not present

## 2020-04-19 DIAGNOSIS — K5090 Crohn's disease, unspecified, without complications: Secondary | ICD-10-CM | POA: Insufficient documentation

## 2020-04-19 DIAGNOSIS — Z7982 Long term (current) use of aspirin: Secondary | ICD-10-CM | POA: Diagnosis not present

## 2020-04-19 DIAGNOSIS — K219 Gastro-esophageal reflux disease without esophagitis: Secondary | ICD-10-CM | POA: Diagnosis not present

## 2020-04-19 DIAGNOSIS — Z955 Presence of coronary angioplasty implant and graft: Secondary | ICD-10-CM | POA: Insufficient documentation

## 2020-04-19 DIAGNOSIS — N840 Polyp of corpus uteri: Secondary | ICD-10-CM | POA: Insufficient documentation

## 2020-04-19 DIAGNOSIS — F419 Anxiety disorder, unspecified: Secondary | ICD-10-CM | POA: Diagnosis not present

## 2020-04-19 HISTORY — DX: Anemia, unspecified: D64.9

## 2020-04-19 HISTORY — DX: Other specified postprocedural states: R11.2

## 2020-04-19 HISTORY — PX: HYSTEROSCOPY WITH D & C: SHX1775

## 2020-04-19 HISTORY — DX: Other specified postprocedural states: Z98.890

## 2020-04-19 HISTORY — DX: Personal history of urinary calculi: Z87.442

## 2020-04-19 HISTORY — DX: Nausea with vomiting, unspecified: R11.2

## 2020-04-19 LAB — ABO/RH: ABO/RH(D): A POS

## 2020-04-19 SURGERY — DILATATION AND CURETTAGE /HYSTEROSCOPY
Anesthesia: General

## 2020-04-19 MED ORDER — DEXAMETHASONE SODIUM PHOSPHATE 10 MG/ML IJ SOLN
INTRAMUSCULAR | Status: DC | PRN
  Administered 2020-04-19: 8 mg via INTRAVENOUS

## 2020-04-19 MED ORDER — FENTANYL CITRATE (PF) 100 MCG/2ML IJ SOLN
INTRAMUSCULAR | Status: AC
  Filled 2020-04-19: qty 2

## 2020-04-19 MED ORDER — DEXMEDETOMIDINE HCL 200 MCG/2ML IV SOLN
INTRAVENOUS | Status: DC | PRN
  Administered 2020-04-19: 12 ug via INTRAVENOUS

## 2020-04-19 MED ORDER — SILVER NITRATE-POT NITRATE 75-25 % EX MISC
CUTANEOUS | Status: AC
  Filled 2020-04-19: qty 10

## 2020-04-19 MED ORDER — FENTANYL CITRATE (PF) 100 MCG/2ML IJ SOLN
INTRAMUSCULAR | Status: DC | PRN
  Administered 2020-04-19: 25 ug via INTRAVENOUS

## 2020-04-19 MED ORDER — DEXAMETHASONE SODIUM PHOSPHATE 10 MG/ML IJ SOLN
INTRAMUSCULAR | Status: AC
  Filled 2020-04-19: qty 1

## 2020-04-19 MED ORDER — ONDANSETRON HCL 4 MG/2ML IJ SOLN
INTRAMUSCULAR | Status: DC | PRN
  Administered 2020-04-19: 4 mg via INTRAVENOUS

## 2020-04-19 MED ORDER — ORAL CARE MOUTH RINSE: 15.0000 mL | Freq: Once | OROMUCOSAL | Status: AC

## 2020-04-19 MED ORDER — EPHEDRINE SULFATE 50 MG/ML IJ SOLN
INTRAMUSCULAR | Status: DC | PRN
  Administered 2020-04-19 (×2): 10 mg via INTRAVENOUS

## 2020-04-19 MED ORDER — DEXMEDETOMIDINE HCL IN NACL 80 MCG/20ML IV SOLN
INTRAVENOUS | Status: AC
  Filled 2020-04-19: qty 20

## 2020-04-19 MED ORDER — SUCCINYLCHOLINE CHLORIDE 200 MG/10ML IV SOSY
PREFILLED_SYRINGE | INTRAVENOUS | Status: AC
  Filled 2020-04-19: qty 10

## 2020-04-19 MED ORDER — LIDOCAINE HCL (PF) 2 % IJ SOLN
INTRAMUSCULAR | Status: AC
  Filled 2020-04-19: qty 5

## 2020-04-19 MED ORDER — SODIUM CHLORIDE (PF) 0.9 % IJ SOLN
INTRAMUSCULAR | Status: AC
  Filled 2020-04-19: qty 10

## 2020-04-19 MED ORDER — CHLORHEXIDINE GLUCONATE 0.12 % MT SOLN: 15.0000 mL | Freq: Once | OROMUCOSAL | Status: AC

## 2020-04-19 MED ORDER — KETAMINE HCL 10 MG/ML IJ SOLN
INTRAMUSCULAR | Status: DC | PRN
Start: 2020-04-19 — End: 2020-04-19
  Administered 2020-04-19: 20 mg via INTRAVENOUS

## 2020-04-19 MED ORDER — ACETAMINOPHEN 10 MG/ML IV SOLN
INTRAVENOUS | Status: DC | PRN
  Administered 2020-04-19: 1000 mg via INTRAVENOUS

## 2020-04-19 MED ORDER — PHENYLEPHRINE HCL (PRESSORS) 10 MG/ML IV SOLN
INTRAVENOUS | Status: DC | PRN
  Administered 2020-04-19: 100 ug via INTRAVENOUS

## 2020-04-19 MED ORDER — OXYCODONE HCL 5 MG PO TABS: 5.0000 mg | ORAL_TABLET | Freq: Once | ORAL | Status: AC | PRN

## 2020-04-19 MED ORDER — ROCURONIUM BROMIDE 10 MG/ML (PF) SYRINGE
PREFILLED_SYRINGE | INTRAVENOUS | Status: AC
  Filled 2020-04-19: qty 10

## 2020-04-19 MED ORDER — PHENYLEPHRINE HCL (PRESSORS) 10 MG/ML IV SOLN
INTRAVENOUS | Status: AC
  Filled 2020-04-19: qty 1

## 2020-04-19 MED ORDER — LACTATED RINGERS IV SOLN: INTRAVENOUS | Status: DC

## 2020-04-19 MED ORDER — OXYCODONE HCL 5 MG PO TABS
ORAL_TABLET | ORAL | Status: AC
  Administered 2020-04-19: 5 mg via ORAL
  Filled 2020-04-19: qty 1

## 2020-04-19 MED ORDER — FENTANYL CITRATE (PF) 100 MCG/2ML IJ SOLN: 25.0000 ug | INTRAMUSCULAR | Status: DC | PRN

## 2020-04-19 MED ORDER — GLYCOPYRROLATE 0.2 MG/ML IJ SOLN
INTRAMUSCULAR | Status: DC | PRN
  Administered 2020-04-19: .2 mg via INTRAVENOUS

## 2020-04-19 MED ORDER — PROPOFOL 500 MG/50ML IV EMUL
INTRAVENOUS | Status: DC | PRN
  Administered 2020-04-19: 200 ug/kg/min via INTRAVENOUS

## 2020-04-19 MED ORDER — ONDANSETRON HCL 4 MG/2ML IJ SOLN
INTRAMUSCULAR | Status: AC
  Filled 2020-04-19: qty 2

## 2020-04-19 MED ORDER — ACETAMINOPHEN 10 MG/ML IV SOLN
INTRAVENOUS | Status: AC
  Filled 2020-04-19: qty 100

## 2020-04-19 MED ORDER — LIDOCAINE HCL (CARDIAC) PF 100 MG/5ML IV SOSY
PREFILLED_SYRINGE | INTRAVENOUS | Status: DC | PRN
  Administered 2020-04-19: 80 mg via INTRAVENOUS

## 2020-04-19 MED ORDER — PROPOFOL 10 MG/ML IV BOLUS
INTRAVENOUS | Status: DC | PRN
  Administered 2020-04-19: 130 mg via INTRAVENOUS

## 2020-04-19 MED ORDER — CHLORHEXIDINE GLUCONATE 0.12 % MT SOLN
OROMUCOSAL | Status: AC
  Administered 2020-04-19: 15 mL via OROMUCOSAL
  Filled 2020-04-19: qty 15

## 2020-04-19 MED ORDER — KETAMINE HCL 50 MG/ML IJ SOLN
INTRAMUSCULAR | Status: AC
  Filled 2020-04-19: qty 10

## 2020-04-19 MED ORDER — GLYCOPYRROLATE 0.2 MG/ML IJ SOLN
INTRAMUSCULAR | Status: AC
  Filled 2020-04-19: qty 1

## 2020-04-19 MED ORDER — PROPOFOL 10 MG/ML IV BOLUS
INTRAVENOUS | Status: AC
  Filled 2020-04-19: qty 20

## 2020-04-19 MED ORDER — PROMETHAZINE HCL 25 MG/ML IJ SOLN: 6.2500 mg | INTRAMUSCULAR | Status: DC | PRN

## 2020-04-19 MED ORDER — PROPOFOL 500 MG/50ML IV EMUL
INTRAVENOUS | Status: AC
  Filled 2020-04-19: qty 50

## 2020-04-19 SURGICAL SUPPLY — 21 items
BAG INFUSER PRESSURE 100CC (MISCELLANEOUS) IMPLANT
CANISTER SUCT 3000ML PPV (MISCELLANEOUS) ×3 IMPLANT
CATH ROBINSON RED A/P 16FR (CATHETERS) ×3 IMPLANT
COVER WAND RF STERILE (DRAPES) ×3 IMPLANT
DEVICE MYOSURE LITE (MISCELLANEOUS) ×3 IMPLANT
ELECT REM PT RETURN 9FT ADLT (ELECTROSURGICAL) ×3
ELECTRODE REM PT RTRN 9FT ADLT (ELECTROSURGICAL) ×1 IMPLANT
GAUZE 4X4 16PLY RFD (DISPOSABLE) ×3 IMPLANT
GLOVE BIO SURGEON STRL SZ7 (GLOVE) ×3 IMPLANT
GLOVE INDICATOR 7.5 STRL GRN (GLOVE) ×3 IMPLANT
GOWN STRL REUS W/ TWL LRG LVL3 (GOWN DISPOSABLE) ×2 IMPLANT
GOWN STRL REUS W/TWL LRG LVL3 (GOWN DISPOSABLE) ×4
KIT PROCEDURE FLUENT (KITS) ×3 IMPLANT
KIT TURNOVER CYSTO (KITS) ×3 IMPLANT
PACK DNC HYST (MISCELLANEOUS) ×3 IMPLANT
PAD OB MATERNITY 4.3X12.25 (PERSONAL CARE ITEMS) ×3 IMPLANT
PAD PREP 24X41 OB/GYN DISP (PERSONAL CARE ITEMS) ×3 IMPLANT
SOL .9 NS 3000ML IRR  AL (IV SOLUTION) ×2
SOL .9 NS 3000ML IRR UROMATIC (IV SOLUTION) ×1 IMPLANT
TUBING CONNECTING 10 (TUBING) ×2 IMPLANT
TUBING CONNECTING 10' (TUBING) ×1

## 2020-04-19 NOTE — Interval H&P Note (Signed)
History and Physical Interval Note:  04/19/2020 7:18 AM  Morgan Moore  has presented today for surgery, with the diagnosis of thickened endometrium.  The various methods of treatment have been discussed with the patient and family. After consideration of risks, benefits and other options for treatment, the patient has consented to  Procedure(s): DILATATION AND CURETTAGE /HYSTEROSCOPY, POSSIBLE  POLYPECTOMY (N/A) as a surgical intervention.  The patient's history has been reviewed, patient examined, no change in status, stable for surgery.  I have reviewed the patient's chart and labs.  Questions were answered to the patient's satisfaction.     Benjaman Kindler

## 2020-04-19 NOTE — Discharge Instructions (Signed)
Discharge instructions after a hysteroscopy with dilation and curettage  Signs and Symptoms to Report  Call our office at [phone] if you have any of the following:   . Fever over 100.4 degrees or higher . Severe stomach pain not relieved with pain medications . Bright red bleeding that's heavier than a period that does not slow with rest after the first 24 hours . To go the bathroom a lot (frequency), you can't hold your urine (urgency), or it hurts when you empty your bladder (urinate) . Chest pain . Shortness of breath . Pain in the calves of your legs . Severe nausea and vomiting not relieved with anti-nausea medications . Any concerns  What You Can Expect after Surgery . You may see some pink tinged, bloody fluid. This is normal. You may also have cramping for several days.   Activities after Your Discharge Follow these guidelines to help speed your recovery at home: . Don't drive if you are in pain or taking narcotic pain medicine. You may drive when you can safely slam on the brakes, turn the wheel forcefully, and rotate your torso comfortably. This is typically 4-7 days. Practice in a parking lot or side street prior to attempting to drive regularly.  . Ask others to help with household chores for 4 weeks. . Don't do strenuous activities, exercises, or sports like vacuuming, tennis, squash, etc. until your doctor says it is safe to do so. . Walk as you feel able. Rest often since it may take a week or two for your energy level to return to normal.  . You may climb stairs . Avoid constipation:   -Eat fruits, vegetables, and whole grains. Eat small meals as your appetite will take time to return to normal.   -Drink 6 to 8 glasses of water each day unless your doctor has told you to limit your fluids.   -Use a laxative or stool softener as needed if constipation becomes a problem. You may take Miralax, metamucil, Citrucil, Colace, Senekot, FiberCon, etc. If this does not  relieve the constipation, try two tablespoons of Milk Of Magnesia every 8 hours until your bowels move.  . You may shower.  . Do not get in a hot tub, swimming pool, etc. until your doctor agrees. . Do not douche, use tampons, or have sex until your doctor says it is okay, usually about 2 weeks. . Take your pain medicine when you need it. The medicine may not work as well if the pain is bad.  Take the medicines you were taking before surgery. Other medications you might need are pain medications (ibuprofen), medications for constipation (Colace) and nausea medications (Zofran).        AMBULATORY SURGERY  DISCHARGE INSTRUCTIONS   1) The drugs that you were given will stay in your system until tomorrow so for the next 24 hours you should not:  A) Drive an automobile B) Make any legal decisions C) Drink any alcoholic beverage   2) You may resume regular meals tomorrow.  Today it is better to start with liquids and gradually work up to solid foods.  You may eat anything you prefer, but it is better to start with liquids, then soup and crackers, and gradually work up to solid foods.   3) Please notify your doctor immediately if you have any unusual bleeding, trouble breathing, redness and pain at the surgery site, drainage, fever, or pain not relieved by medication.    4) Additional Instructions:  Please contact your physician with any problems or Same Day Surgery at [phone], Monday through Friday 6 am to 4 pm, or [place] at [facility] number at [phone].

## 2020-04-19 NOTE — Anesthesia Preprocedure Evaluation (Addendum)
Anesthesia Evaluation  Patient identified by MRN, date of birth, ID band Patient awake    Reviewed: Allergy & Precautions, H&P , NPO status , Patient's Chart, lab work & pertinent test results  History of Anesthesia Complications (+) PONV  Airway Mallampati: III  TM Distance: <3 FB     Dental  (+) Chipped   Pulmonary neg pulmonary ROS, neg COPD,    breath sounds clear to auscultation       Cardiovascular hypertension, (-) angina+ CAD, + Past MI and + Cardiac Stents (2017)  + dysrhythmias (palpitations)  Rhythm:regular Rate:Normal     Neuro/Psych  Headaches, PSYCHIATRIC DISORDERS Anxiety Depression    GI/Hepatic Neg liver ROS, GERD  Controlled,  Endo/Other  negative endocrine ROS  Renal/GU      Musculoskeletal   Abdominal   Peds  Hematology negative hematology ROS (+)   Anesthesia Other Findings Past Medical History: No date: Allergy No date: Anemia No date: Anxiety No date: Chicken pox No date: Complication of anesthesia     Comment:  vomitting No date: Coronary artery disease No date: Crohn disease ([place]) 2017: DVT (deep venous thrombosis) ([facility]) No date: Dyspnea     Comment:  on excertion 5859: Dysrhythmia     Comment:  palpitations No date: GERD (gastroesophageal reflux disease) No date: Heart disease No date: Heart murmur No date: Heart murmur No date: History of kidney stones No date: Hyperlipidemia No date: Hypertension No date: Inflammatory bowel disease No date: Migraines     Comment:  hormonal, puberty No date: Myocardial infarction ([place]) No date: Phlebitis No date: PONV (postoperative nausea and vomiting)  Past Surgical History: 07/05/2019: BREAST BIOPSY; Left     Comment:  FIBROEPITHELIAL lesion/neg No date: BREAST CYST ASPIRATION     Comment:  unsure of side 12/02/2017: CARPAL TUNNEL RELEASE; Right     Comment:  Procedure: CARPAL TUNNEL RELEASE ENDOSCOPIC;  Surgeon:                Corky Mull, MD;  Location: [facility] ORS;  Service:               Orthopedics;  Laterality: Right; 2017: CORONARY ANGIOPLASTY WITH STENT PLACEMENT     Comment:  x 2 No date: EYE SURGERY; Bilateral     Comment:  cataract surgery 12/02/2017: GANGLION CYST EXCISION; Right     Comment:  Procedure: REMOVAL GANGLION OF WRIST;  Surgeon: Corky Mull, MD;  Location: [facility] ORS;  Service: Orthopedics;                Laterality: Right; No date: heart murmur 1962: TONSILLECTOMY AND ADENOIDECTOMY 1993: TUBAL LIGATION 1990: tubaligation  BMI    Body Mass Index: 30.11 kg/m      Reproductive/Obstetrics negative OB ROS                           Anesthesia Physical Anesthesia Plan  ASA: III  Anesthesia Plan: General LMA   Post-op Pain Management:    Induction:   PONV Risk Score and Plan: Dexamethasone, Ondansetron and Treatment may vary due to age or medical condition  Airway Management Planned:   Additional Equipment:   Intra-op Plan:   Post-operative Plan:   Informed Consent: I have reviewed the patients History and Physical, chart, labs and discussed the procedure including the risks, benefits and alternatives for the proposed anesthesia with the patient  or authorized representative who has indicated his/her understanding and acceptance.     Dental Advisory Given  Plan Discussed with: Anesthesiologist, CRNA and Surgeon  Anesthesia Plan Comments:         Anesthesia Quick Evaluation

## 2020-04-19 NOTE — Transfer of Care (Signed)
Immediate Anesthesia Transfer of Care Note  Patient: Morgan Moore  Procedure(s) Performed: DILATATION AND CURETTAGE /HYSTEROSCOPY, POSSIBLE  POLYPECTOMY (N/A )  Patient Location: PACU  Anesthesia Type:General  Level of Consciousness: drowsy  Airway & Oxygen Therapy: Patient Spontanous Breathing and Patient connected to face mask oxygen  Post-op Assessment: Report given to RN and Post -op Vital signs reviewed and stable  Post vital signs: Reviewed and stable  Last Vitals:  Vitals Value Taken Time  BP 120/59 04/19/20 0829  Temp    Pulse 63 04/19/20 0832  Resp 17 04/19/20 0832  SpO2 100 % 04/19/20 0832  Vitals shown include unvalidated device data.  Last Pain:  Vitals:   04/19/20 0629  TempSrc: Temporal  PainSc: 0-No pain         Complications: No complications documented.

## 2020-04-19 NOTE — Anesthesia Procedure Notes (Signed)
Procedure Name: LMA Insertion Date/Time: 04/19/2020 7:38 AM Performed by: Lia Foyer, CRNA Pre-anesthesia Checklist: Patient identified, Emergency Drugs available, Suction available and Patient being monitored Patient Re-evaluated:Patient Re-evaluated prior to induction Oxygen Delivery Method: Circle system utilized Preoxygenation: Pre-oxygenation with 100% oxygen Induction Type: IV induction Ventilation: Mask ventilation without difficulty LMA: LMA inserted LMA Size: 4.0 Tube type: Oral Number of attempts: 1 Airway Equipment and Method: Oral airway and Patient positioned with wedge pillow Placement Confirmation: positive ETCO2 and breath sounds checked- equal and bilateral Tube secured with: Tape Dental Injury: Teeth and Oropharynx as per pre-operative assessment

## 2020-04-19 NOTE — Op Note (Signed)
Operative Report Hysteroscopy with Dilation and Curettage   Indications: Thickened stripe   Pre-operative Diagnosis:  Endometrial polyp   Post-operative Diagnosis: same.  Procedure: 1. Exam under anesthesia 2. Fractional D&C 3. Hysteroscopy 4. Myosure polypectomy  Surgeon: Benjaman Kindler, MD  Assistant(s):  None  Anesthesia: General LMA anesthesia  Anesthesiologist: No responsible provider has been recorded for the case. Anesthesiologist: Tera Mater, MD CRNA: Lia Foyer, CRNA  Estimated Blood Loss:  Minimal         Intraoperative medications: none         Total IV Fluids: 427m  Urine Output: 538m Total Fluid Deficit:  65 mL          Specimens: Endocervical curettings, endometrial curettings         Complications:  None; patient tolerated the procedure well.         Disposition: PACU - hemodynamically stable.         Condition: stable  Findings: Uterus measuring 8 cm by sound; normal cervix, vagina, perineum. Endometrial polyp on right near fundus.  Indication for procedure/Consents: 89.o. F  here for scheduled surgery for the aforementioned diagnoses.   Risks of surgery were discussed with the patient including but not limited to: bleeding which may require transfusion; infection which may require antibiotics; injury to uterus or surrounding organs; intrauterine scarring which may impair future fertility; need for additional procedures including laparotomy or laparoscopy; and other postoperative/anesthesia complications. Written informed consent was obtained.    Procedure Details:  D&C/ Myosure  The patient was taken to the operating room where anesthesia was administered and was found to be adequate. After a formal and adequate timeout was performed, she was placed in the dorsal lithotomy position and examined with the above findings. She was then prepped and draped in the sterile manner. Her bladder was catheterized for an estimated amount  of clear, yellow urine. A weighed speculum was then placed in the patient's vagina and a single tooth tenaculum was applied to the anterior lip of the cervix.  Her cervix was serially dilated to 15 FrPakistansing Hanks dilators. An ECC was performed. The hysteroscope was introduced under direct observation  Using lactated ringers as a distention medium to reveal the above findings. The uterine cavity was carefully examined, both ostia were recognized, and diffusely atrophic endometrium with the polyp above were noted.   This was resected using the Myosure device.  After further careful visualization of the uterine cavity, the hysteroscope was removed under direct visualization.  A sharp curettage was then performed until there was a gritty texture in all four quadrants. The tenaculum was removed from the anterior lip of the cervix and the vaginal speculum was removed after applying silver nitrate for good hemostasis.   The patient tolerated the procedure well and was taken to the recovery area awake and in stable condition. She received iv acetaminophen and Toradol prior to leaving the OR.  The patient will be discharged to home as per PACU criteria. Routine postoperative instructions given. She was prescribed Ibuprofen and Colace. She will follow up in the clinic in two weeks for postoperative evaluation.

## 2020-04-22 ENCOUNTER — Encounter: Payer: Self-pay | Admitting: Obstetrics and Gynecology

## 2020-04-22 ENCOUNTER — Other Ambulatory Visit: Payer: Medicare PPO

## 2020-04-22 LAB — SURGICAL PATHOLOGY

## 2020-04-22 NOTE — Anesthesia Postprocedure Evaluation (Signed)
Anesthesia Post Note  Patient: Morgan Moore  Procedure(s) Performed: DILATATION AND CURETTAGE /HYSTEROSCOPY, POSSIBLE  POLYPECTOMY (N/A )  Anesthesia Type: General   No complications documented.   Last Pain:  Vitals:   04/22/20 0836  TempSrc:   PainSc: 0-No pain                 Tera Mater

## 2020-04-24 ENCOUNTER — Other Ambulatory Visit: Payer: Medicare PPO

## 2020-05-15 ENCOUNTER — Ambulatory Visit: Payer: Medicare PPO | Admitting: Physical Therapy

## 2020-05-15 ENCOUNTER — Other Ambulatory Visit: Payer: Self-pay

## 2020-05-15 DIAGNOSIS — T6391XA Toxic effect of contact with unspecified venomous animal, accidental (unintentional), initial encounter: Secondary | ICD-10-CM | POA: Diagnosis not present

## 2020-05-20 ENCOUNTER — Ambulatory Visit: Payer: Medicare PPO | Attending: Obstetrics and Gynecology | Admitting: Physical Therapy

## 2020-05-20 ENCOUNTER — Encounter: Payer: Self-pay | Admitting: Physical Therapy

## 2020-05-20 ENCOUNTER — Other Ambulatory Visit: Payer: Self-pay

## 2020-05-20 DIAGNOSIS — R278 Other lack of coordination: Secondary | ICD-10-CM | POA: Insufficient documentation

## 2020-05-20 DIAGNOSIS — M6281 Muscle weakness (generalized): Secondary | ICD-10-CM | POA: Insufficient documentation

## 2020-05-20 DIAGNOSIS — R32 Unspecified urinary incontinence: Secondary | ICD-10-CM | POA: Diagnosis not present

## 2020-05-20 NOTE — Therapy (Signed)
[place] [facility] [address] [place], [place], [address] Phone: [phone]   Fax:  [phone]  Physical Therapy Evaluation  Patient Details  Name: Aeriana Speece MRN: [id number] Date of Birth: 1956-07-09 Referring Provider (PT): Benjaman Kindler   Encounter Date: 05/20/2020   PT End of Session - 05/20/20 1550    Visit Number 1    Number of Visits 12    Date for PT Re-Evaluation 08/12/20    Authorization Type IE 05/20/2020    PT Start Time 1300    PT Stop Time 1355    PT Time Calculation (min) 55 min    Activity Tolerance Patient tolerated treatment well    Behavior During Therapy [facility] for tasks assessed/performed;Anxious           Past Medical History:  Diagnosis Date   Allergy    Anemia    Anxiety    Chicken pox    Complication of anesthesia    vomitting   Coronary artery disease    Crohn disease ([place])    DVT (deep venous thrombosis) ([place]) 2017   Dyspnea    on excertion   Dysrhythmia 2019   palpitations   GERD (gastroesophageal reflux disease)    Heart disease    Heart murmur    Heart murmur    History of kidney stones    Hyperlipidemia    Hypertension    Inflammatory bowel disease    Migraines    hormonal, puberty   Myocardial infarction ([place])    Phlebitis    PONV (postoperative nausea and vomiting)     Past Surgical History:  Procedure Laterality Date   BREAST BIOPSY Left 07/05/2019   FIBROEPITHELIAL lesion/neg   BREAST CYST ASPIRATION     unsure of side   CARPAL TUNNEL RELEASE Right 12/02/2017   Procedure: CARPAL TUNNEL RELEASE ENDOSCOPIC;  Surgeon: Corky Mull, MD;  Location: [facility] ORS;  Service: Orthopedics;  Laterality: Right;   CORONARY ANGIOPLASTY WITH STENT PLACEMENT  2017   x 2   EYE SURGERY Bilateral    cataract surgery   GANGLION CYST EXCISION Right 12/02/2017   Procedure: REMOVAL GANGLION OF WRIST;  Surgeon: Corky Mull, MD;  Location: [facility] ORS;   Service: Orthopedics;  Laterality: Right;   heart murmur     HYSTEROSCOPY WITH D & C N/A 04/19/2020   Procedure: DILATATION AND CURETTAGE /HYSTEROSCOPY, POSSIBLE  POLYPECTOMY;  Surgeon: Benjaman Kindler, MD;  Location: [facility] ORS;  Service: Gynecology;  Laterality: N/A;   TONSILLECTOMY AND ADENOIDECTOMY  [employer]   tubaligation  1990    There were no vitals filed for this visit.        [facility] PT Assessment - 05/20/20 0001      Assessment   Medical Diagnosis Female genital prolapse    Referring Provider (PT) Benjaman Kindler    Prior Therapy None for this dx            PELVIC HEALTH PHYSICAL THERAPY EVALUATION  SCREENING Red Flags: None Have you had any night sweats? Unexplained weight loss? Saddle anesthesia? Unexplained changes in bowel or bladder habits?  Precautions: Osteoporosis  SUBJECTIVE  Chief Complaint: Patient notes that her primary concern is bladder prolapse and she is unable to delay/suprress the urge to urinate. Patient adds that when she is working in the garden (carrying harvest and digging) she can sense her bladder dropping. She has hx of vertebral fractures (T8/9). Patient had mass removed  which was benign and is not interested in surgical intervention for POP if it can be avoided. Patient notes that after urinating when she stands up, she will feel the urge to urinate again.   Pertinent History:  Falls Negative.  Scoliosis Negative. Pulmonary disease/dysfunction Negative. Surgical history: Positive for D&C for benign mass.    Obstetrical History: G2P2 Deliveries: vaginal Tearing/Episiotomy: lateral incision  Gynecological History: Hysterectomy: No Pain with exam: No   Urinary History: Incontinence: Positive. Onset: 2019 Triggers: visualization of toilet/urgency; sitting. Amount: Min. Fluid Intake: 2-3 16.9 oz bottles H20, 1-2 glasses tea caffeinated Nocturia: 0-1x/night Frequency of urination: every 3-4 hours Pain  with urination: Negative Difficulty initiating urination: Negative Frequent UTI: Negative.   Gastrointestinal History: [place] Stool Chart: Type 4 Frequency of BMs: 1x/day Pain with defecation: Negative (unless presence of Crohn flare up)  Straining with defecation: Negative Incontinence: Negative.   Presence of Crohn disease  Sexual activity/pain: Pain with intercourse: Negative.    Location of pain: n/a    Patient assessment of present state: My bladder is out of position  Current activities:  Building a book mobile, gardening/farming, community activities  Patient Goals:  Hopefully avoid surgery  OBJECTIVE  Mental Status Patient is oriented to person, place and time.  Recent memory is intact.  Remote memory is intact.  Attention span and concentration are intact.  Expressive speech is intact.  Patient's fund of knowledge is within normal limits for educational level.  POSTURE/OBSERVATIONS:  Lumbar lordosis: WNL Iliac crest height: equal bilaterally Lumbar lateral shift: negative Pelvic obliquity: negative  GAIT: WFL; no gross deficits or aberrations   RANGE OF MOTION:    LEFT RIGHT  Lumbar forward flexion (65):  WNL    Lumbar extension (30): WNL    Lumbar lateral flexion (25):  WNL WNL  Thoracic and Lumbar rotation (30 degrees):    WNL WNL  Hip Flexion (0-125):   WNL WNL  Hip IR (0-45):  WNL WNL  Hip ER (0-45):  WNL WNL  Hip Abduction (0-40):  WNL WNL  Hip extension (0-15):  WNL WNL    SENSATION: Grossly intact to light touch bilateral LEs as determined by testing dermatomes L2-S2 Proprioception and hot/cold testing deferred on this date  STRENGTH: MMT   RLE LLE  Hip Flexion 4 4  Hip Extension 4 4  Hip Abduction  4 4  Hip Adduction  4 4  Hip ER  4 4  Hip IR  4 4  Knee Extension 4 4  Knee Flexion 4 4  Dorsiflexion  4 4  Plantarflexion (seated) 4 4   ABDOMINAL: deferred 2/2 to time constraints Palpation: Diastasis: Scar mobility: Rib  flare:  PHYSICAL PERFORMANCE MEASURES: STS: WNL  EXTERNAL PELVIC EXAM: deferred 2/2 to time constraints Palpation: Breath coordination: Cued Lengthen: Cued Contraction: Cough:  INTERNAL VAGINAL EXAM: deferred 2/2 to time constraints Introitus Appears:  Skin integrity:  Scar mobility: Strength (PERF):  Symmetry: Palpation: Prolapse:   OUTCOME MEASURES: FOTO Urinary 62   ASSESSMENT Patient is a 64 year old presenting to clinic with chief complaints of bladder prolapse and UUI. Upon examination, patient demonstrates deficits in IAP management, PFM strength, PFM coordination, and bladder habits as evidenced by bladder prolapse and increased pressure/heaviness with lifting/physical activity, occasional inability to suppress urge to urinate, chest breathing and breath holding habits. Patient's responses on FOTO outcome measures (62) indicate moderate functional limitations/disability/distress. Patient's progress may be limited due to stage of prolapse and persistent mismanagement of IAP; however, patient's motivation  is advantageous. Patient was able to achieve basic understanding of IAP during today's evaluation and responded positively to educational interventions. Patient will benefit from continued skilled therapeutic intervention to address deficits in IAP management, PFM strength, PFM coordination, and bladder habits in order to increase function and improve overall QOL.  EDUCATION Patient educated on prognosis, POC, and provided with HEP including: diaphragmatic breathing. Patient articulated understanding and returned demonstration. Patient will benefit from further education in order to maximize compliance and understanding for long-term therapeutic gains.  TREATMENT Neuromuscular Re-education: Patient educated on primary functions of the pelvic floor including: posture/balance, sexual pleasure, storage and elimination of waste from the body, abdominal cavity closure, and breath  coordination. Seated diaphragmatic breathing with VC and TC PRN   Objective measurements completed on examination: See above findings.                    PT Long Term Goals - 05/20/20 1527      PT LONG TERM GOAL #1   Title Patient will demonstrate improved function as evidenced by a score of 70 on FOTO measure for full participation in activities at home and in the community.    Baseline IE: 78    Time 12    Period Weeks    Status New    Target Date 08/12/20      PT LONG TERM GOAL #2   Title Patient will demonstrate improved urinary urge suppression as evidenced by ability to delay > 30 minutes to improve participation in the community.    Baseline IE: 10 min    Time 12    Period Weeks    Status New    Target Date 08/12/20      PT LONG TERM GOAL #3   Title Patient will report POP causes "no difficulty" with participation in physical and social activities to return to PLOF and improve QOL.    Baseline IE: a little bit of difficulty    Time 12    Period Weeks    Status New    Target Date 08/12/20      PT LONG TERM GOAL #4   Title Patient will demonstrate circumferential and sequential contraction of >4/5 MMT, > 6 sec hold x10 and 5 consecutive quick flicks with </= 10 min rest between testing bouts, and relaxation of the PFM coordinated with breath for improved management of intra-abdominal pressure and POP without the presence of pain nor incontinence in order to improve participation at home and in the community.    Baseline IE: not demonstrated    Time 12    Period Weeks    Status New    Target Date 08/12/20                  Plan - 05/20/20 1547    Clinical Impression Statement Patient is a 64 year old presenting to clinic with chief complaints of bladder prolapse and UUI. Upon examination, patient demonstrates deficits in IAP management, PFM strength, PFM coordination, and bladder habits as evidenced by bladder prolapse and increased  pressure/heaviness with lifting/physical activity, occasional inability to suppress urge to urinate, chest breathing and breath holding habits. Patient's responses on FOTO outcome measures (62) indicate moderate functional limitations/disability/distress. Patient's progress may be limited due to stage of prolapse and persistent mismanagement of IAP; however, patient's motivation is advantageous. Patient was able to achieve basic understanding of IAP during today's evaluation and responded positively to educational interventions. Patient will benefit from continued skilled therapeutic  intervention to address deficits in IAP management, PFM strength, PFM coordination, and bladder habits in order to increase function and improve overall QOL.    Personal Factors and Comorbidities Age;Behavior Pattern;Comorbidity 3+;Fitness;Past/Current Experience;Time since onset of injury/illness/exacerbation    Comorbidities HTN, migraines, CAD, GERD, Crohn disease, osteoporosis, leg length inequality, mild depression, hx of DVT, anxiety    Examination-Activity Limitations Continence;Carry;Stairs;Squat;Lift;Bend    Examination-Participation Restrictions Interpersonal Relationship;Yard Work;Cleaning;Laundry;Community Activity    Stability/Clinical Decision Making Evolving/Moderate complexity    Clinical Decision Making Moderate    Rehab Potential Fair    PT Frequency 1x / week    PT Duration 12 weeks    PT Treatment/Interventions Electrical Stimulation;ADLs/Self Care Home Management;Biofeedback;Moist Heat;Cryotherapy;Therapeutic activities;Functional mobility training;Stair training;Gait training;Therapeutic exercise;Balance training;Neuromuscular re-education;Taping;Dry needling;Passive range of motion;Manual techniques;Orthotic Fit/Training;Patient/family education;Scar mobilization;Spinal Manipulations;Joint Manipulations    PT Next Visit Plan internal/external PFM assessment; strengthening program    PT Home Exercise  Plan diaphragmatic breathing    Consulted and Agree with Plan of Care Patient           Patient will benefit from skilled therapeutic intervention in order to improve the following deficits and impairments:  Decreased endurance, Difficulty walking, Improper body mechanics, Decreased range of motion, Decreased activity tolerance, Decreased coordination, Decreased strength, Postural dysfunction, Decreased balance  Visit Diagnosis: Muscle weakness (generalized)  Other lack of coordination  Urinary incontinence, unspecified type     Problem List Patient Active Problem List   Diagnosis Date Noted   Leg skin lesion, right 03/24/2020   Shingles 12/10/2019   Abnormal mammogram 12/02/2019   Thickened endometrium 10/29/2019   Fluid in endometrial cavity 01/08/2019   Vitamin D deficiency 01/18/2018   Iron deficiency anemia secondary to inadequate dietary iron intake 01/17/2018   Easy bruising 12/23/2017   Anemia 12/23/2017   Flexor carpi ulnaris tenosynovitis 12/03/2017   Carpal tunnel syndrome, right 10/15/2017   Ganglion of right wrist 10/15/2017   Leg length inequality 10/15/2017   Lumbar spondylosis 10/15/2017   Osteoporosis without current pathological fracture 09/09/2017   Heart murmur 09/09/2017   Mild depression ([place]) 09/09/2017   Myocardial infarction ([place])    Crohn disease ([place])    Chest pain 04/12/2017   Right arm pain 01/16/2017   History of frequent urinary tract infections 08/09/2016   Heart palpitations 08/03/2016   Weakness 07/02/2016   CAD (coronary artery disease) 04/17/2016   Light headedness 04/17/2016   Hip pain 04/13/2016   Health care maintenance 01/19/2016   Essential hypertension 11/07/2015   Hypercholesterolemia 11/07/2015   Migraine headache 11/07/2015   Nephrolithiasis 11/07/2015   GERD (gastroesophageal reflux disease) 11/07/2015   Mitral regurgitation 11/07/2015   Stress 11/07/2015   Varicose veins of both  lower extremities with pain 05/10/2015   Pharyngoesophageal dysphagia 03/11/2015   Pancreatic lesion 04/25/2014   Hemangioma of liver 03/12/2014   Arthralgia 05/29/2011   Myles Gip PT, DPT [phone] 05/20/2020, 3:51 PM  [facility] [address] [place], [place], [address] Phone: [phone]   Fax:  [phone]  Name: Lovene Maret MRN: [id number] Date of Birth: 20-May-1956

## 2020-05-29 ENCOUNTER — Encounter: Payer: Self-pay | Admitting: Physical Therapy

## 2020-05-29 ENCOUNTER — Ambulatory Visit: Payer: Medicare PPO | Admitting: Physical Therapy

## 2020-05-29 ENCOUNTER — Other Ambulatory Visit: Payer: Self-pay

## 2020-05-29 DIAGNOSIS — R278 Other lack of coordination: Secondary | ICD-10-CM

## 2020-05-29 DIAGNOSIS — M6281 Muscle weakness (generalized): Secondary | ICD-10-CM

## 2020-05-29 DIAGNOSIS — R32 Unspecified urinary incontinence: Secondary | ICD-10-CM | POA: Diagnosis not present

## 2020-05-29 NOTE — Therapy (Signed)
[facility] [address] [place], [place], [address] Phone: [phone]   Fax:  [phone]  Physical Therapy Treatment  Patient Details  Name: Morgan Moore MRN: [id number] Date of Birth: 1956/07/03 Referring Provider (PT): Benjaman Kindler   Encounter Date: 05/29/2020   PT End of Session - 05/29/20 1507    Visit Number 2    Number of Visits 12    Date for PT Re-Evaluation 08/12/20    Authorization Type IE 05/20/2020    PT Start Time 1500    PT Stop Time 1555    PT Time Calculation (min) 55 min    Activity Tolerance Patient tolerated treatment well    Behavior During Therapy [facility] for tasks assessed/performed           Past Medical History:  Diagnosis Date  . Allergy   . Anemia   . Anxiety   . Chicken pox   . Complication of anesthesia    vomitting  . Coronary artery disease   . Crohn disease ([place])   . DVT (deep venous thrombosis) ([place]) 2017  . Dyspnea    on excertion  . Dysrhythmia 2019   palpitations  . GERD (gastroesophageal reflux disease)   . Heart disease   . Heart murmur   . Heart murmur   . History of kidney stones   . Hyperlipidemia   . Hypertension   . Inflammatory bowel disease   . Migraines    hormonal, puberty  . Myocardial infarction ([place])   . Phlebitis   . PONV (postoperative nausea and vomiting)     Past Surgical History:  Procedure Laterality Date  . BREAST BIOPSY Left 07/05/2019   FIBROEPITHELIAL lesion/neg  . BREAST CYST ASPIRATION     unsure of side  . CARPAL TUNNEL RELEASE Right 12/02/2017   Procedure: CARPAL TUNNEL RELEASE ENDOSCOPIC;  Surgeon: Corky Mull, MD;  Location: [facility] ORS;  Service: Orthopedics;  Laterality: Right;  . CORONARY ANGIOPLASTY WITH STENT PLACEMENT  2017   x 2  . EYE SURGERY Bilateral    cataract surgery  . GANGLION CYST EXCISION Right 12/02/2017   Procedure: REMOVAL GANGLION OF WRIST;  Surgeon: Corky Mull, MD;  Location: [facility] ORS;  Service:  Orthopedics;  Laterality: Right;  . heart murmur    . HYSTEROSCOPY WITH D & C N/A 04/19/2020   Procedure: DILATATION AND CURETTAGE /HYSTEROSCOPY, POSSIBLE  POLYPECTOMY;  Surgeon: Benjaman Kindler, MD;  Location: [facility] ORS;  Service: Gynecology;  Laterality: N/A;  . TONSILLECTOMY AND ADENOIDECTOMY  1962  . TUBAL LIGATION  1993  . tubaligation  1990    There were no vitals filed for this visit.   Subjective Assessment - 05/29/20 1501    Subjective Patient notes that she has been working on diaphragmatic breathing but doesn't see any difference. She does add that she has known for sometime that she has not had a pattern of breathing deeply and with cardiac conditions she was dealing with SOB. Patient notes that she feels she has good control and awareness of her PFM.    Currently in Pain? No/denies            TREATMENT  Pre-treatment assessment: EXTERNAL PELVIC EXAM: Patient educated on the purpose of the pelvic exam and articulated understanding; patient consented to the exam verbally. Breath coordination: faintly present, but patient unable to sense Cued Lengthen: abdominal and breath holding compensations, no PFM movement Cued Contraction: adductor, gluteal, and abdominal compensations; PFM lag, but  do activate 1/5 MMT Cough: paradoxical coordination  Neuromuscular Re-education: Supine hooklying diaphragmatic breathing with VCs and TCs for downregulation of the nervous system and improved management of IAP Seated diaphragmatic breathing with TC via washcloth roll for improved PFM proprioception Patient education on factors impacting ability to utilize diaphragm for breathing: stress, exertion, pain, long-standing patterns. Patient education on importance of diaphragmatic breathing for: PFM coordination and proprioception, pain modulation, IAP management.   Patient educated throughout session on appropriate technique and form using multi-modal cueing, HEP, and activity modification.  Patient articulated understanding and returned demonstration.  Patient Response to interventions: Patient comfortable with working on diaphragmatic breathing and PFM proprioception activities for two weeks before return.  ASSESSMENT Patient presents to clinic with excellent motivation to participate in therapy. Patient demonstrates deficits in IAP management, PFM strength, PFM coordination, and bladder habits. Patient able to achieve diaphragmatic breathing for minimal repetitions in supine and seated postures during today's session and responded positively to educational interventions. Patient will benefit from continued skilled therapeutic intervention to address remaining deficits in IAP management, PFM strength, PFM coordination, and bladder habits in order to increase function and improve overall QOL.           PT Long Term Goals - 05/20/20 1527      PT LONG TERM GOAL #1   Title Patient will demonstrate improved function as evidenced by a score of 70 on FOTO measure for full participation in activities at home and in the community.    Baseline IE: 81    Time 12    Period Weeks    Status New    Target Date 08/12/20      PT LONG TERM GOAL #2   Title Patient will demonstrate improved urinary urge suppression as evidenced by ability to delay > 30 minutes to improve participation in the community.    Baseline IE: 10 min    Time 12    Period Weeks    Status New    Target Date 08/12/20      PT LONG TERM GOAL #3   Title Patient will report POP causes "no difficulty" with participation in physical and social activities to return to PLOF and improve QOL.    Baseline IE: "a little bit of difficulty"    Time 12    Period Weeks    Status New    Target Date 08/12/20      PT LONG TERM GOAL #4   Title Patient will demonstrate circumferential and sequential contraction of >4/5 MMT, > 6 sec hold x10 and 5 consecutive quick flicks with </= 10 min rest between testing bouts, and  relaxation of the PFM coordinated with breath for improved management of intra-abdominal pressure and POP without the presence of pain nor incontinence in order to improve participation at home and in the community.    Baseline IE: not demonstrated    Time 12    Period Weeks    Status New    Target Date 08/12/20                 Plan - 05/29/20 1507    Clinical Impression Statement Patient presents to clinic with excellent motivation to participate in therapy. Patient demonstrates deficits in IAP management, PFM strength, PFM coordination, and bladder habits. Patient able to achieve diaphragmatic breathing for minimal repetitions in supine and seated postures during today's session and responded positively to educational interventions. Patient will benefit from continued skilled therapeutic intervention to address remaining deficits in  IAP management, PFM strength, PFM coordination, and bladder habits in order to increase function and improve overall QOL.    Personal Factors and Comorbidities Age;Behavior Pattern;Comorbidity 3+;Fitness;Past/Current Experience;Time since onset of injury/illness/exacerbation    Comorbidities HTN, migraines, CAD, GERD, Crohn disease, osteoporosis, leg length inequality, mild depression, hx of DVT, anxiety    Examination-Activity Limitations Continence;Carry;Stairs;Squat;Lift;Bend    Examination-Participation Restrictions Interpersonal Relationship;Yard Work;Cleaning;Laundry;Community Activity    Stability/Clinical Decision Making Evolving/Moderate complexity    Rehab Potential Fair    PT Frequency 1x / week    PT Duration 12 weeks    PT Treatment/Interventions Electrical Stimulation;ADLs/Self Care Home Management;Biofeedback;Moist Heat;Cryotherapy;Therapeutic activities;Functional mobility training;Stair training;Gait training;Therapeutic exercise;Balance training;Neuromuscular re-education;Taping;Dry needling;Passive range of motion;Manual techniques;Orthotic  Fit/Training;Patient/family education;Scar mobilization;Spinal Manipulations;Joint Manipulations    PT Next Visit Plan internal/external PFM assessment; strengthening program    PT Home Exercise Plan diaphragmatic breathing    Consulted and Agree with Plan of Care Patient           Patient will benefit from skilled therapeutic intervention in order to improve the following deficits and impairments:  Decreased endurance, Difficulty walking, Improper body mechanics, Decreased range of motion, Decreased activity tolerance, Decreased coordination, Decreased strength, Postural dysfunction, Decreased balance  Visit Diagnosis: Muscle weakness (generalized)  Other lack of coordination  Urinary incontinence, unspecified type     Problem List Patient Active Problem List   Diagnosis Date Noted  . Leg skin lesion, right 03/24/2020  . Shingles 12/10/2019  . Abnormal mammogram 12/02/2019  . Thickened endometrium 10/29/2019  . Fluid in endometrial cavity 01/08/2019  . Vitamin D deficiency 01/18/2018  . Iron deficiency anemia secondary to inadequate dietary iron intake 01/17/2018  . Easy bruising 12/23/2017  . Anemia 12/23/2017  . Flexor carpi ulnaris tenosynovitis 12/03/2017  . Carpal tunnel syndrome, right 10/15/2017  . Ganglion of right wrist 10/15/2017  . Leg length inequality 10/15/2017  . Lumbar spondylosis 10/15/2017  . Osteoporosis without current pathological fracture 09/09/2017  . Heart murmur 09/09/2017  . Mild depression ([place]) 09/09/2017  . Myocardial infarction ([place])   . Crohn disease ([place])   . Chest pain 04/12/2017  . Right arm pain 01/16/2017  . History of frequent urinary tract infections 08/09/2016  . Heart palpitations 08/03/2016  . Weakness 07/02/2016  . CAD (coronary artery disease) 04/17/2016  . Light headedness 04/17/2016  . Hip pain 04/13/2016  . Health care maintenance 01/19/2016  . Essential hypertension 11/07/2015  . Hypercholesterolemia 11/07/2015  .  Migraine headache 11/07/2015  . Nephrolithiasis 11/07/2015  . GERD (gastroesophageal reflux disease) 11/07/2015  . Mitral regurgitation 11/07/2015  . Stress 11/07/2015  . Varicose veins of both lower extremities with pain 05/10/2015  . Pharyngoesophageal dysphagia 03/11/2015  . Pancreatic lesion 04/25/2014  . Hemangioma of liver 03/12/2014  . Arthralgia 05/29/2011   Myles Gip PT, DPT [phone] 05/29/2020, 4:42 PM  [place] [address] [place], [place], [address] Phone: [phone]   Fax:  [phone]  Name: Morgan Moore MRN: [id number] Date of Birth: Jan 04, 1956

## 2020-06-04 ENCOUNTER — Ambulatory Visit: Payer: Medicare PPO | Admitting: Physical Therapy

## 2020-06-12 ENCOUNTER — Encounter: Payer: Self-pay | Admitting: Physical Therapy

## 2020-06-12 ENCOUNTER — Ambulatory Visit: Payer: Medicare PPO | Attending: Obstetrics and Gynecology | Admitting: Physical Therapy

## 2020-06-12 ENCOUNTER — Other Ambulatory Visit: Payer: Self-pay

## 2020-06-12 DIAGNOSIS — M6281 Muscle weakness (generalized): Secondary | ICD-10-CM | POA: Diagnosis not present

## 2020-06-12 DIAGNOSIS — R32 Unspecified urinary incontinence: Secondary | ICD-10-CM | POA: Diagnosis not present

## 2020-06-12 DIAGNOSIS — R278 Other lack of coordination: Secondary | ICD-10-CM | POA: Diagnosis not present

## 2020-06-12 NOTE — Therapy (Signed)
[facility] [address] [place], [place], [address] Phone: [phone]   Fax:  [phone]  Physical Therapy Treatment  Patient Details  Name: Morgan Moore MRN: [id number] Date of Birth: 09-06-1956 Referring Provider (PT): Benjaman Kindler   Encounter Date: 06/12/2020   PT End of Session - 06/12/20 1508    Visit Number 3    Number of Visits 12    Date for PT Re-Evaluation 08/12/20    Authorization Type IE 05/20/2020    PT Start Time 1500    PT Stop Time 1555    PT Time Calculation (min) 55 min    Activity Tolerance Patient tolerated treatment well    Behavior During Therapy [facility] for tasks assessed/performed           Past Medical History:  Diagnosis Date   Allergy    Anemia    Anxiety    Chicken pox    Complication of anesthesia    vomitting   Coronary artery disease    Crohn disease ([place])    DVT (deep venous thrombosis) ([place]) 2017   Dyspnea    on excertion   Dysrhythmia 2019   palpitations   GERD (gastroesophageal reflux disease)    Heart disease    Heart murmur    Heart murmur    History of kidney stones    Hyperlipidemia    Hypertension    Inflammatory bowel disease    Migraines    hormonal, puberty   Myocardial infarction ([place])    Phlebitis    PONV (postoperative nausea and vomiting)     Past Surgical History:  Procedure Laterality Date   BREAST BIOPSY Left 07/05/2019   FIBROEPITHELIAL lesion/neg   BREAST CYST ASPIRATION     unsure of side   CARPAL TUNNEL RELEASE Right 12/02/2017   Procedure: CARPAL TUNNEL RELEASE ENDOSCOPIC;  Surgeon: Corky Mull, MD;  Location: [facility] ORS;  Service: Orthopedics;  Laterality: Right;   CORONARY ANGIOPLASTY WITH STENT PLACEMENT  2017   x 2   EYE SURGERY Bilateral    cataract surgery   GANGLION CYST EXCISION Right 12/02/2017   Procedure: REMOVAL GANGLION OF WRIST;  Surgeon: Corky Mull, MD;  Location: [facility] ORS;  Service:  Orthopedics;  Laterality: Right;   heart murmur     HYSTEROSCOPY WITH D & C N/A 04/19/2020   Procedure: DILATATION AND CURETTAGE /HYSTEROSCOPY, POSSIBLE  POLYPECTOMY;  Surgeon: Benjaman Kindler, MD;  Location: [facility] ORS;  Service: Gynecology;  Laterality: N/A;   TONSILLECTOMY AND ADENOIDECTOMY  [place]   tubaligation  1990    There were no vitals filed for this visit.   Subjective Assessment - 06/12/20 1501    Subjective Patient states that she has been working on her exercises. Patient reports that she has one main BM/day but has other smaller BMs throughout the day; she notes this is dependent on her Crohn's disease. Patient notes that she feels her PFM awareness is improving. She is even able to do Kegels without using her glutes or other muscles. Patient notes she had some urgency on arrival after not emptying her bladder since 10A.    Currently in Pain? No/denies          TREATMENT  Neuromuscular Re-education: Supine hooklying diaphragmatic breathing with VCs and TCs for downregulation of the nervous system and improved management of IAP Supine hooklying, PFM lengthening with inhalation. VCs and TCs to decrease compensatory patterns and encourage optimal  relaxation of the PFM. Supine hooklying, PFM contractions (2x5) with exhalation, hips elevated. VCs and TCs to decrease compensatory patterns and encourage activation of the PFM. Patient education on HEP progression and provided with heel lift to help create more symmetry in PFM forces for improved function/coordination.     Patient educated throughout session on appropriate technique and form using multi-modal cueing, HEP, and activity modification. Patient articulated understanding and returned demonstration.  Patient Response to interventions: Patient confident with PFM strengthening HEP  ASSESSMENT Patient presents to clinic with excellent motivation to participate in therapy. Patient demonstrates  deficits in IAP management, PFM strength, PFM coordination, and bladder habits. Patient able to achieve 2 sets of 5 PFM contractions with palpable squeeze and lift during today's session and responded positively to educational and active interventions. Patient will benefit from continued skilled therapeutic intervention to address remaining deficits in IAP management, PFM strength, PFM coordination, and bladder habits in order to increase function and improve overall QOL.     PT Long Term Goals - 05/20/20 1527      PT LONG TERM GOAL #1   Title Patient will demonstrate improved function as evidenced by a score of 70 on FOTO measure for full participation in activities at home and in the community.    Baseline IE: 36    Time 12    Period Weeks    Status New    Target Date 08/12/20      PT LONG TERM GOAL #2   Title Patient will demonstrate improved urinary urge suppression as evidenced by ability to delay > 30 minutes to improve participation in the community.    Baseline IE: 10 min    Time 12    Period Weeks    Status New    Target Date 08/12/20      PT LONG TERM GOAL #3   Title Patient will report POP causes "no difficulty" with participation in physical and social activities to return to PLOF and improve QOL.    Baseline IE: a little bit of difficulty    Time 12    Period Weeks    Status New    Target Date 08/12/20      PT LONG TERM GOAL #4   Title Patient will demonstrate circumferential and sequential contraction of >4/5 MMT, > 6 sec hold x10 and 5 consecutive quick flicks with </= 10 min rest between testing bouts, and relaxation of the PFM coordinated with breath for improved management of intra-abdominal pressure and POP without the presence of pain nor incontinence in order to improve participation at home and in the community.    Baseline IE: not demonstrated    Time 12    Period Weeks    Status New    Target Date 08/12/20                 Plan - 06/12/20 1508     Clinical Impression Statement Patient presents to clinic with excellent motivation to participate in therapy. Patient demonstrates deficits in IAP management, PFM strength, PFM coordination, and bladder habits. Patient able to achieve 2 sets of 5 PFM contractions with palpable squeeze and lift during today's session and responded positively to educational and active interventions. Patient will benefit from continued skilled therapeutic intervention to address remaining deficits in IAP management, PFM strength, PFM coordination, and bladder habits in order to increase function and improve overall QOL.    Personal Factors and Comorbidities Age;Behavior Pattern;Comorbidity 3+;Fitness;Past/Current Experience;Time since onset of injury/illness/exacerbation  Comorbidities HTN, migraines, CAD, GERD, Crohn disease, osteoporosis, leg length inequality, mild depression, hx of DVT, anxiety    Examination-Activity Limitations Continence;Carry;Stairs;Squat;Lift;Bend    Examination-Participation Restrictions Interpersonal Relationship;Yard Work;Cleaning;Laundry;Community Activity    Stability/Clinical Decision Making Evolving/Moderate complexity    Rehab Potential Fair    PT Frequency 1x / week    PT Duration 12 weeks    PT Treatment/Interventions Electrical Stimulation;ADLs/Self Care Home Management;Biofeedback;Moist Heat;Cryotherapy;Therapeutic activities;Functional mobility training;Stair training;Gait training;Therapeutic exercise;Balance training;Neuromuscular re-education;Taping;Dry needling;Passive range of motion;Manual techniques;Orthotic Fit/Training;Patient/family education;Scar mobilization;Spinal Manipulations;Joint Manipulations    PT Next Visit Plan internal/external PFM assessment; strengthening program    PT Home Exercise Plan diaphragmatic breathing    Consulted and Agree with Plan of Care Patient           Patient will benefit from skilled therapeutic intervention in order to improve the  following deficits and impairments:  Decreased endurance, Difficulty walking, Improper body mechanics, Decreased range of motion, Decreased activity tolerance, Decreased coordination, Decreased strength, Postural dysfunction, Decreased balance  Visit Diagnosis: Muscle weakness (generalized)  Other lack of coordination  Urinary incontinence, unspecified type     Problem List Patient Active Problem List   Diagnosis Date Noted   Leg skin lesion, right 03/24/2020   Shingles 12/10/2019   Abnormal mammogram 12/02/2019   Thickened endometrium 10/29/2019   Fluid in endometrial cavity 01/08/2019   Vitamin D deficiency 01/18/2018   Iron deficiency anemia secondary to inadequate dietary iron intake 01/17/2018   Easy bruising 12/23/2017   Anemia 12/23/2017   Flexor carpi ulnaris tenosynovitis 12/03/2017   Carpal tunnel syndrome, right 10/15/2017   Ganglion of right wrist 10/15/2017   Leg length inequality 10/15/2017   Lumbar spondylosis 10/15/2017   Osteoporosis without current pathological fracture 09/09/2017   Heart murmur 09/09/2017   Mild depression ([place]) 09/09/2017   Myocardial infarction ([place])    Crohn disease ([place])    Chest pain 04/12/2017   Right arm pain 01/16/2017   History of frequent urinary tract infections 08/09/2016   Heart palpitations 08/03/2016   Weakness 07/02/2016   CAD (coronary artery disease) 04/17/2016   Light headedness 04/17/2016   Hip pain 04/13/2016   Health care maintenance 01/19/2016   Essential hypertension 11/07/2015   Hypercholesterolemia 11/07/2015   Migraine headache 11/07/2015   Nephrolithiasis 11/07/2015   GERD (gastroesophageal reflux disease) 11/07/2015   Mitral regurgitation 11/07/2015   Stress 11/07/2015   Varicose veins of both lower extremities with pain 05/10/2015   Pharyngoesophageal dysphagia 03/11/2015   Pancreatic lesion 04/25/2014   Hemangioma of liver 03/12/2014   Arthralgia 05/29/2011    Myles Gip PT, DPT [phone] 06/12/2020, 4:23 PM  [facility] [facility] [address] [place], [place], [address] Phone: [phone]   Fax:  [phone]  Name: Morgan Moore MRN: [id number] Date of Birth: 09-01-1956

## 2020-06-18 ENCOUNTER — Ambulatory Visit: Payer: Medicare PPO | Admitting: Physical Therapy

## 2020-06-19 ENCOUNTER — Other Ambulatory Visit: Payer: Self-pay

## 2020-06-19 ENCOUNTER — Ambulatory Visit
Admission: RE | Admit: 2020-06-19 | Discharge: 2020-06-19 | Disposition: A | Discharge status: Home / Self Care | Payer: Medicare PPO | Source: Ambulatory Visit | Attending: Internal Medicine | Admitting: Internal Medicine

## 2020-06-19 DIAGNOSIS — Z1231 Encounter for screening mammogram for malignant neoplasm of breast: Secondary | ICD-10-CM | POA: Insufficient documentation

## 2020-06-26 ENCOUNTER — Other Ambulatory Visit: Payer: Self-pay

## 2020-06-26 ENCOUNTER — Ambulatory Visit: Payer: Medicare PPO | Admitting: Physical Therapy

## 2020-06-26 ENCOUNTER — Encounter: Payer: Self-pay | Admitting: Physical Therapy

## 2020-06-26 DIAGNOSIS — R278 Other lack of coordination: Secondary | ICD-10-CM | POA: Diagnosis not present

## 2020-06-26 DIAGNOSIS — M6281 Muscle weakness (generalized): Secondary | ICD-10-CM | POA: Diagnosis not present

## 2020-06-26 DIAGNOSIS — R32 Unspecified urinary incontinence: Secondary | ICD-10-CM

## 2020-06-26 NOTE — Therapy (Signed)
[facility] [address] [place], [place], [address] Phone: [phone]   Fax:  [phone]  Physical Therapy Treatment  Patient Details  Name: Morgan Moore MRN: [id number] Date of Birth: 1956/06/16 Referring Provider (PT): Benjaman Kindler   Encounter Date: 06/26/2020   PT End of Session - 06/26/20 1513    Visit Number 4    Number of Visits 12    Date for PT Re-Evaluation 08/12/20    Authorization Type IE 05/20/2020    PT Start Time 1500    PT Stop Time 1555    PT Time Calculation (min) 55 min    Activity Tolerance Patient tolerated treatment well    Behavior During Therapy [facility] for tasks assessed/performed           Past Medical History:  Diagnosis Date  . Allergy   . Anemia   . Anxiety   . Chicken pox   . Complication of anesthesia    vomitting  . Coronary artery disease   . Crohn disease ([place])   . DVT (deep venous thrombosis) ([place]) 2017  . Dyspnea    on excertion  . Dysrhythmia 2019   palpitations  . GERD (gastroesophageal reflux disease)   . Heart disease   . Heart murmur   . Heart murmur   . History of kidney stones   . Hyperlipidemia   . Hypertension   . Inflammatory bowel disease   . Migraines    hormonal, puberty  . Myocardial infarction ([place])   . Phlebitis   . PONV (postoperative nausea and vomiting)     Past Surgical History:  Procedure Laterality Date  . BREAST BIOPSY Left 07/05/2019   FIBROEPITHELIAL lesion/neg  . BREAST CYST ASPIRATION     unsure of side  . CARPAL TUNNEL RELEASE Right 12/02/2017   Procedure: CARPAL TUNNEL RELEASE ENDOSCOPIC;  Surgeon: Corky Mull, MD;  Location: [facility] ORS;  Service: Orthopedics;  Laterality: Right;  . CORONARY ANGIOPLASTY WITH STENT PLACEMENT  2017   x 2  . EYE SURGERY Bilateral    cataract surgery  . GANGLION CYST EXCISION Right 12/02/2017   Procedure: REMOVAL GANGLION OF WRIST;  Surgeon: Corky Mull, MD;  Location: [facility] ORS;  Service:  Orthopedics;  Laterality: Right;  . heart murmur    . HYSTEROSCOPY WITH D & C N/A 04/19/2020   Procedure: DILATATION AND CURETTAGE /HYSTEROSCOPY, POSSIBLE  POLYPECTOMY;  Surgeon: Benjaman Kindler, MD;  Location: [facility] ORS;  Service: Gynecology;  Laterality: N/A;  . TONSILLECTOMY AND ADENOIDECTOMY  1962  . TUBAL LIGATION  1993  . tubaligation  1990    There were no vitals filed for this visit.   Subjective Assessment - 06/26/20 1508    Subjective Patient reports that she has been working on her PFM exercises and feels she has much improved control. Patient adds that she cannot recall if she has leaked but does not think she has. Patient notes she has been very wound up because of her sister's health concerns.    Currently in Pain? No/denies           TREATMENT  Neuromuscular Re-education: Supine hooklying diaphragmatic breathing with VCs and TCs for downregulation of the nervous system and improved management of IAP Supine hooklying, PFM lengthening with inhalation. VCs and TCs to decrease compensatory patterns and encourage optimal relaxation of the PFM. Supine hooklying, PFM contractions (2x5) with exhalation, hips elevated. VCs and TCs to decrease compensatory patterns and encourage activation of the  PFM.    Patient educated throughout session on appropriate technique and form using multi-modal cueing, HEP, and activity modification. Patient articulated understanding and returned demonstration.  Patient Response to interventions: Patient confident to continue with PFM strengthening HEP  ASSESSMENT Patient presents to clinic with excellent motivation to participate in therapy. Patient demonstrates deficits in IAP management, PFM strength, PFM coordination, and bladder habits. Patient able to achieve 2 sets of 5 PFM contractions (MMT 2-3/5) during today's session and responded positively to educational and active interventions. Patient will benefit from continued skilled therapeutic  intervention to address remaining deficits in IAP management, PFM strength, PFM coordination, and bladder habits in order to increase function and improve overall QOL.     PT Long Term Goals - 05/20/20 1527      PT LONG TERM GOAL #1   Title Patient will demonstrate improved function as evidenced by a score of 70 on FOTO measure for full participation in activities at home and in the community.    Baseline IE: 58    Time 12    Period Weeks    Status New    Target Date 08/12/20      PT LONG TERM GOAL #2   Title Patient will demonstrate improved urinary urge suppression as evidenced by ability to delay > 30 minutes to improve participation in the community.    Baseline IE: 10 min    Time 12    Period Weeks    Status New    Target Date 08/12/20      PT LONG TERM GOAL #3   Title Patient will report POP causes "no difficulty" with participation in physical and social activities to return to PLOF and improve QOL.    Baseline IE: "a little bit of difficulty"    Time 12    Period Weeks    Status New    Target Date 08/12/20      PT LONG TERM GOAL #4   Title Patient will demonstrate circumferential and sequential contraction of >4/5 MMT, > 6 sec hold x10 and 5 consecutive quick flicks with </= 10 min rest between testing bouts, and relaxation of the PFM coordinated with breath for improved management of intra-abdominal pressure and POP without the presence of pain nor incontinence in order to improve participation at home and in the community.    Baseline IE: not demonstrated    Time 12    Period Weeks    Status New    Target Date 08/12/20                 Plan - 06/26/20 1514    Clinical Impression Statement Patient presents to clinic with excellent motivation to participate in therapy. Patient demonstrates deficits in IAP management, PFM strength, PFM coordination, and bladder habits. Patient able to achieve 2 sets of 5 PFM contractions (MMT 2-3/5) during today's session and  responded positively to educational and active interventions. Patient will benefit from continued skilled therapeutic intervention to address remaining deficits in IAP management, PFM strength, PFM coordination, and bladder habits in order to increase function and improve overall QOL.    Personal Factors and Comorbidities Age;Behavior Pattern;Comorbidity 3+;Fitness;Past/Current Experience;Time since onset of injury/illness/exacerbation    Comorbidities HTN, migraines, CAD, GERD, Crohn disease, osteoporosis, leg length inequality, mild depression, hx of DVT, anxiety    Examination-Activity Limitations Continence;Carry;Stairs;Squat;Lift;Bend    Examination-Participation Restrictions Interpersonal Relationship;Yard Work;Cleaning;Laundry;Community Activity    Stability/Clinical Decision Making Evolving/Moderate complexity    Rehab Potential Fair  PT Frequency 1x / week    PT Duration 12 weeks    PT Treatment/Interventions Electrical Stimulation;ADLs/Self Care Home Management;Biofeedback;Moist Heat;Cryotherapy;Therapeutic activities;Functional mobility training;Stair training;Gait training;Therapeutic exercise;Balance training;Neuromuscular re-education;Taping;Dry needling;Passive range of motion;Manual techniques;Orthotic Fit/Training;Patient/family education;Scar mobilization;Spinal Manipulations;Joint Manipulations    PT Next Visit Plan internal/external PFM assessment; strengthening program    PT Home Exercise Plan diaphragmatic breathing    Consulted and Agree with Plan of Care Patient           Patient will benefit from skilled therapeutic intervention in order to improve the following deficits and impairments:  Decreased endurance, Difficulty walking, Improper body mechanics, Decreased range of motion, Decreased activity tolerance, Decreased coordination, Decreased strength, Postural dysfunction, Decreased balance  Visit Diagnosis: Muscle weakness (generalized)  Other lack of  coordination  Urinary incontinence, unspecified type     Problem List Patient Active Problem List   Diagnosis Date Noted  . Leg skin lesion, right 03/24/2020  . Shingles 12/10/2019  . Abnormal mammogram 12/02/2019  . Thickened endometrium 10/29/2019  . Fluid in endometrial cavity 01/08/2019  . Vitamin D deficiency 01/18/2018  . Iron deficiency anemia secondary to inadequate dietary iron intake 01/17/2018  . Easy bruising 12/23/2017  . Anemia 12/23/2017  . Flexor carpi ulnaris tenosynovitis 12/03/2017  . Carpal tunnel syndrome, right 10/15/2017  . Ganglion of right wrist 10/15/2017  . Leg length inequality 10/15/2017  . Lumbar spondylosis 10/15/2017  . Osteoporosis without current pathological fracture 09/09/2017  . Heart murmur 09/09/2017  . Mild depression ([place]) 09/09/2017  . Myocardial infarction ([place])   . Crohn disease ([place])   . Chest pain 04/12/2017  . Right arm pain 01/16/2017  . History of frequent urinary tract infections 08/09/2016  . Heart palpitations 08/03/2016  . Weakness 07/02/2016  . CAD (coronary artery disease) 04/17/2016  . Light headedness 04/17/2016  . Hip pain 04/13/2016  . Health care maintenance 01/19/2016  . Essential hypertension 11/07/2015  . Hypercholesterolemia 11/07/2015  . Migraine headache 11/07/2015  . Nephrolithiasis 11/07/2015  . GERD (gastroesophageal reflux disease) 11/07/2015  . Mitral regurgitation 11/07/2015  . Stress 11/07/2015  . Varicose veins of both lower extremities with pain 05/10/2015  . Pharyngoesophageal dysphagia 03/11/2015  . Pancreatic lesion 04/25/2014  . Hemangioma of liver 03/12/2014  . Arthralgia 05/29/2011   Myles Gip PT, DPT [phone]  06/26/2020, 4:34 PM  [place] [facility] [address] [place], [place], [address] Phone: [phone]   Fax:  [phone]  Name: Morgan Moore MRN: [id number] Date of Birth: [birth year]

## 2020-07-11 ENCOUNTER — Other Ambulatory Visit: Payer: Self-pay

## 2020-07-11 ENCOUNTER — Encounter: Payer: Self-pay | Admitting: Physical Therapy

## 2020-07-11 ENCOUNTER — Ambulatory Visit: Payer: Medicare PPO | Attending: Obstetrics and Gynecology | Admitting: Physical Therapy

## 2020-07-11 DIAGNOSIS — M6281 Muscle weakness (generalized): Secondary | ICD-10-CM | POA: Diagnosis not present

## 2020-07-11 DIAGNOSIS — R32 Unspecified urinary incontinence: Secondary | ICD-10-CM | POA: Insufficient documentation

## 2020-07-11 DIAGNOSIS — R278 Other lack of coordination: Secondary | ICD-10-CM | POA: Insufficient documentation

## 2020-07-11 NOTE — Therapy (Signed)
[facility] [address] [place], [place], [address] Phone: [phone]   Fax:  [phone]  Physical Therapy Treatment  Patient Details  Name: Morgan Moore MRN: [id number] Date of Birth: 10-24-1956 Referring Provider (PT): Benjaman Kindler   Encounter Date: 07/11/2020   PT End of Session - 07/11/20 1521    Visit Number 5    Number of Visits 12    Date for PT Re-Evaluation 08/12/20    Authorization Type IE 05/20/2020    PT Start Time 1500    PT Stop Time 1555    PT Time Calculation (min) 55 min    Activity Tolerance Patient tolerated treatment well    Behavior During Therapy [facility] for tasks assessed/performed           Past Medical History:  Diagnosis Date  . Allergy   . Anemia   . Anxiety   . Chicken pox   . Complication of anesthesia    vomitting  . Coronary artery disease   . Crohn disease ([place])   . DVT (deep venous thrombosis) ([place]) 2017  . Dyspnea    on excertion  . Dysrhythmia 2019   palpitations  . GERD (gastroesophageal reflux disease)   . Heart disease   . Heart murmur   . Heart murmur   . History of kidney stones   . Hyperlipidemia   . Hypertension   . Inflammatory bowel disease   . Migraines    hormonal, puberty  . Myocardial infarction ([place])   . Phlebitis   . PONV (postoperative nausea and vomiting)     Past Surgical History:  Procedure Laterality Date  . BREAST BIOPSY Left 07/05/2019   FIBROEPITHELIAL lesion/neg  . BREAST CYST ASPIRATION     unsure of side  . CARPAL TUNNEL RELEASE Right 12/02/2017   Procedure: CARPAL TUNNEL RELEASE ENDOSCOPIC;  Surgeon: Corky Mull, MD;  Location: [facility] ORS;  Service: Orthopedics;  Laterality: Right;  . CORONARY ANGIOPLASTY WITH STENT PLACEMENT  2017   x 2  . EYE SURGERY Bilateral    cataract surgery  . GANGLION CYST EXCISION Right 12/02/2017   Procedure: REMOVAL GANGLION OF WRIST;  Surgeon: Corky Mull, MD;  Location: [facility] ORS;  Service:  Orthopedics;  Laterality: Right;  . heart murmur    . HYSTEROSCOPY WITH D & C N/A 04/19/2020   Procedure: DILATATION AND CURETTAGE /HYSTEROSCOPY, POSSIBLE  POLYPECTOMY;  Surgeon: Benjaman Kindler, MD;  Location: [facility] ORS;  Service: Gynecology;  Laterality: N/A;  . TONSILLECTOMY AND ADENOIDECTOMY  1962  . TUBAL LIGATION  1993  . tubaligation  1990    There were no vitals filed for this visit.   Subjective Assessment - 07/11/20 1509    Subjective Patient states that she has been caring for her sister post-neurosurgery. Patient has been doing her exercises when she can and has been able to use PFM contractions to delay urge.    Currently in Pain? No/denies           TREATMENT  Neuromuscular Re-education: Supine hooklying diaphragmatic breathing with VCs and TCs for downregulation of the nervous system and improved management of IAP Supine hooklying, PFM lengthening with inhalation. VCs and TCs to decrease compensatory patterns and encourage optimal relaxation of the PFM. Supine hooklying, PFM contractions with exhalation, hips elevated. VCs and TCs to decrease compensatory patterns and encourage activation of the PFM. Seated TrA activation with exhalation. Patient requires VC and TC for sequencing and decreased compensatory pelvic movement.  Seated PFM contractions with exhalation. VCs and TCs to decrease compensatory patterns and encourage activation of the PFM.    Patient educated throughout session on appropriate technique and form using multi-modal cueing, HEP, and activity modification. Patient articulated understanding and returned demonstration.  Patient Response to interventions: Patient confident to continue with PFM strengthening HEP  ASSESSMENT Patient presents to clinic with excellent motivation to participate in therapy. Patient demonstrates deficits in IAP management, PFM strength, PFM coordination, and bladder habits. Patient able to achieve coordinated PFM and TrA  activation without compensations during today's session and responded positively to educational and active interventions. Patient will benefit from continued skilled therapeutic intervention to address remaining deficits in IAP management, PFM strength, PFM coordination, and bladder habits in order to increase function and improve overall QOL.      PT Long Term Goals - 05/20/20 1527      PT LONG TERM GOAL #1   Title Patient will demonstrate improved function as evidenced by a score of 70 on FOTO measure for full participation in activities at home and in the community.    Baseline IE: 43    Time 12    Period Weeks    Status New    Target Date 08/12/20      PT LONG TERM GOAL #2   Title Patient will demonstrate improved urinary urge suppression as evidenced by ability to delay > 30 minutes to improve participation in the community.    Baseline IE: 10 min    Time 12    Period Weeks    Status New    Target Date 08/12/20      PT LONG TERM GOAL #3   Title Patient will report POP causes "no difficulty" with participation in physical and social activities to return to PLOF and improve QOL.    Baseline IE: "a little bit of difficulty"    Time 12    Period Weeks    Status New    Target Date 08/12/20      PT LONG TERM GOAL #4   Title Patient will demonstrate circumferential and sequential contraction of >4/5 MMT, > 6 sec hold x10 and 5 consecutive quick flicks with </= 10 min rest between testing bouts, and relaxation of the PFM coordinated with breath for improved management of intra-abdominal pressure and POP without the presence of pain nor incontinence in order to improve participation at home and in the community.    Baseline IE: not demonstrated    Time 12    Period Weeks    Status New    Target Date 08/12/20                 Plan - 07/11/20 1522    Clinical Impression Statement Patient presents to clinic with excellent motivation to participate in therapy. Patient  demonstrates deficits in IAP management, PFM strength, PFM coordination, and bladder habits. Patient able to achieve coordinated PFM and TrA activation without compensations during today's session and responded positively to educational and active interventions. Patient will benefit from continued skilled therapeutic intervention to address remaining deficits in IAP management, PFM strength, PFM coordination, and bladder habits in order to increase function and improve overall QOL.    Personal Factors and Comorbidities Age;Behavior Pattern;Comorbidity 3+;Fitness;Past/Current Experience;Time since onset of injury/illness/exacerbation    Comorbidities HTN, migraines, CAD, GERD, Crohn disease, osteoporosis, leg length inequality, mild depression, hx of DVT, anxiety    Examination-Activity Limitations Continence;Carry;Stairs;Squat;Lift;Bend    Examination-Participation Restrictions Interpersonal Relationship;Yard Work;Cleaning;Laundry;Community Activity  Stability/Clinical Decision Making Evolving/Moderate complexity    Rehab Potential Fair    PT Frequency 1x / week    PT Duration 12 weeks    PT Treatment/Interventions Electrical Stimulation;ADLs/Self Care Home Management;Biofeedback;Moist Heat;Cryotherapy;Therapeutic activities;Functional mobility training;Stair training;Gait training;Therapeutic exercise;Balance training;Neuromuscular re-education;Taping;Dry needling;Passive range of motion;Manual techniques;Orthotic Fit/Training;Patient/family education;Scar mobilization;Spinal Manipulations;Joint Manipulations    PT Next Visit Plan internal/external PFM assessment; strengthening program    PT Home Exercise Plan diaphragmatic breathing    Consulted and Agree with Plan of Care Patient           Patient will benefit from skilled therapeutic intervention in order to improve the following deficits and impairments:  Decreased endurance, Difficulty walking, Improper body mechanics, Decreased range of  motion, Decreased activity tolerance, Decreased coordination, Decreased strength, Postural dysfunction, Decreased balance  Visit Diagnosis: Muscle weakness (generalized)  Other lack of coordination  Urinary incontinence, unspecified type     Problem List Patient Active Problem List   Diagnosis Date Noted  . Leg skin lesion, right 03/24/2020  . Shingles 12/10/2019  . Abnormal mammogram 12/02/2019  . Thickened endometrium 10/29/2019  . Fluid in endometrial cavity 01/08/2019  . Vitamin D deficiency 01/18/2018  . Iron deficiency anemia secondary to inadequate dietary iron intake 01/17/2018  . Easy bruising 12/23/2017  . Anemia 12/23/2017  . Flexor carpi ulnaris tenosynovitis 12/03/2017  . Carpal tunnel syndrome, right 10/15/2017  . Ganglion of right wrist 10/15/2017  . Leg length inequality 10/15/2017  . Lumbar spondylosis 10/15/2017  . Osteoporosis without current pathological fracture 09/09/2017  . Heart murmur 09/09/2017  . Mild depression ([place]) 09/09/2017  . Myocardial infarction ([place])   . Crohn disease ([place])   . Chest pain 04/12/2017  . Right arm pain 01/16/2017  . History of frequent urinary tract infections 08/09/2016  . Heart palpitations 08/03/2016  . Weakness 07/02/2016  . CAD (coronary artery disease) 04/17/2016  . Light headedness 04/17/2016  . Hip pain 04/13/2016  . Health care maintenance 01/19/2016  . Essential hypertension 11/07/2015  . Hypercholesterolemia 11/07/2015  . Migraine headache 11/07/2015  . Nephrolithiasis 11/07/2015  . GERD (gastroesophageal reflux disease) 11/07/2015  . Mitral regurgitation 11/07/2015  . Stress 11/07/2015  . Varicose veins of both lower extremities with pain 05/10/2015  . Pharyngoesophageal dysphagia 03/11/2015  . Pancreatic lesion 04/25/2014  . Hemangioma of liver 03/12/2014  . Arthralgia 05/29/2011   Myles Gip PT, DPT [phone]  07/11/2020, 6:21 PM  [facility] [address] [place], [place], [address] Phone: [phone]   Fax:  [phone]  Name: Morgan Moore MRN: [id number] Date of Birth: November 01, 1956

## 2020-07-16 ENCOUNTER — Other Ambulatory Visit: Payer: Self-pay

## 2020-07-16 ENCOUNTER — Other Ambulatory Visit (INDEPENDENT_AMBULATORY_CARE_PROVIDER_SITE_OTHER): Payer: Medicare PPO

## 2020-07-16 DIAGNOSIS — I10 Essential (primary) hypertension: Secondary | ICD-10-CM

## 2020-07-16 DIAGNOSIS — E7800 Pure hypercholesterolemia, unspecified: Secondary | ICD-10-CM | POA: Diagnosis not present

## 2020-07-16 LAB — LIPID PANEL
Cholesterol: 180 mg/dL (ref 0–200)
HDL: 45.1 mg/dL (ref >39.00)
LDL Cholesterol: 104 mg/dL — ABNORMAL HIGH (ref 0–99)
NonHDL: 135.01
Total CHOL/HDL Ratio: 4
Triglycerides: 156 mg/dL — ABNORMAL HIGH (ref 0.0–149.0)
VLDL: 31.2 mg/dL (ref 0.0–40.0)

## 2020-07-16 LAB — BASIC METABOLIC PANEL
BUN: 15 mg/dL (ref 6–23)
CO2: 29 mEq/L (ref 19–32)
Calcium: 9.3 mg/dL (ref 8.4–10.5)
Chloride: 104 mEq/L (ref 96–112)
Creatinine, Ser: 0.9 mg/dL (ref 0.40–1.20)
GFR: 62.76 mL/min (ref >60.00)
Glucose, Bld: 90 mg/dL (ref 70–99)
Potassium: 4.3 mEq/L (ref 3.5–5.1)
Sodium: 140 mEq/L (ref 135–145)

## 2020-07-16 LAB — HEPATIC FUNCTION PANEL
ALT: 21 U/L (ref 0–35)
AST: 17 U/L (ref 0–37)
Albumin: 4.1 g/dL (ref 3.5–5.2)
Alkaline Phosphatase: 75 U/L (ref 39–117)
Bilirubin, Direct: 0.1 mg/dL (ref 0.0–0.3)
Total Bilirubin: 0.5 mg/dL (ref 0.2–1.2)
Total Protein: 6.4 g/dL (ref 6.0–8.3)

## 2020-07-18 ENCOUNTER — Other Ambulatory Visit: Payer: Self-pay

## 2020-07-18 ENCOUNTER — Encounter: Payer: Self-pay | Admitting: Internal Medicine

## 2020-07-18 ENCOUNTER — Ambulatory Visit (INDEPENDENT_AMBULATORY_CARE_PROVIDER_SITE_OTHER): Payer: Medicare PPO | Admitting: Internal Medicine

## 2020-07-18 DIAGNOSIS — D509 Iron deficiency anemia, unspecified: Secondary | ICD-10-CM

## 2020-07-18 DIAGNOSIS — I2510 Atherosclerotic heart disease of native coronary artery without angina pectoris: Secondary | ICD-10-CM | POA: Diagnosis not present

## 2020-07-18 DIAGNOSIS — E7800 Pure hypercholesterolemia, unspecified: Secondary | ICD-10-CM

## 2020-07-18 DIAGNOSIS — E559 Vitamin D deficiency, unspecified: Secondary | ICD-10-CM | POA: Diagnosis not present

## 2020-07-18 DIAGNOSIS — F320 Major depressive disorder, single episode, mild: Secondary | ICD-10-CM | POA: Diagnosis not present

## 2020-07-18 DIAGNOSIS — I10 Essential (primary) hypertension: Secondary | ICD-10-CM | POA: Diagnosis not present

## 2020-07-18 DIAGNOSIS — I6529 Occlusion and stenosis of unspecified carotid artery: Secondary | ICD-10-CM | POA: Diagnosis not present

## 2020-07-18 DIAGNOSIS — F32A Depression, unspecified: Secondary | ICD-10-CM

## 2020-07-18 DIAGNOSIS — K219 Gastro-esophageal reflux disease without esophagitis: Secondary | ICD-10-CM

## 2020-07-18 DIAGNOSIS — K50919 Crohn's disease, unspecified, with unspecified complications: Secondary | ICD-10-CM | POA: Diagnosis not present

## 2020-07-18 MED ORDER — ROSUVASTATIN CALCIUM 5 MG PO TABS: ORAL_TABLET | ORAL | 3 refills | Status: DC

## 2020-07-18 NOTE — Progress Notes (Signed)
Patient ID: Morgan Moore, female   DOB: Dec 14, 1955, 64 y.o.   MRN: [id number]   Subjective:    Patient ID: Morgan Moore, female    DOB: August 02, 1956, 64 y.o.   MRN: [id number]  HPI This visit occurred during the SARS-CoV-2 public health emergency.  Safety protocols were in place, including screening questions prior to the visit, additional usage of staff PPE, and extensive cleaning of exam room while observing appropriate contact time as indicated for disinfecting solutions.  Patient here for a scheduled follow up. She reports she is doing relatively well.  Staying busy.  No chest pain or sob reported with increased activity or exertion.  No cough or congestion.  Is s/p D&C hysteroscopy with myosure polypectomy 04/19/20.  Seeing gyn.  Had f/u 05/08/20.  Recommended pelvic floor PT.  This has gone well.  No abdominal pain.  Bowels moving.  Some right ear change.  No increased pain.  Blood pressure had been elevated recently.  Increased stress with her sister's health issues.  Appears to be better now.  She has good support.  Appears to be handling stress well.  Discussed labs.  Discussed changing dose of cholesterol medication.     Past Medical History:  Diagnosis Date  . Allergy   . Anemia   . Anxiety   . Chicken pox   . Complication of anesthesia    vomitting  . Coronary artery disease   . Crohn disease ([place])   . DVT (deep venous thrombosis) ([place]) 2017  . Dyspnea    on excertion  . Dysrhythmia 2019   palpitations  . GERD (gastroesophageal reflux disease)   . Heart disease   . Heart murmur   . Heart murmur   . History of kidney stones   . Hyperlipidemia   . Hypertension   . Inflammatory bowel disease   . Migraines    hormonal, puberty  . Myocardial infarction ([place])   . Phlebitis   . PONV (postoperative nausea and vomiting)    Past Surgical History:  Procedure Laterality Date  . BREAST BIOPSY Left 07/05/2019   FIBROEPITHELIAL lesion/neg  . BREAST CYST ASPIRATION      unsure of side  . CARPAL TUNNEL RELEASE Right 12/02/2017   Procedure: CARPAL TUNNEL RELEASE ENDOSCOPIC;  Surgeon: Corky Mull, MD;  Location: [facility] ORS;  Service: Orthopedics;  Laterality: Right;  . CORONARY ANGIOPLASTY WITH STENT PLACEMENT  2017   x 2  . EYE SURGERY Bilateral    cataract surgery  . GANGLION CYST EXCISION Right 12/02/2017   Procedure: REMOVAL GANGLION OF WRIST;  Surgeon: Corky Mull, MD;  Location: [facility] ORS;  Service: Orthopedics;  Laterality: Right;  . heart murmur    . HYSTEROSCOPY WITH D & C N/A 04/19/2020   Procedure: DILATATION AND CURETTAGE /HYSTEROSCOPY, POSSIBLE  POLYPECTOMY;  Surgeon: Benjaman Kindler, MD;  Location: [facility] ORS;  Service: Gynecology;  Laterality: N/A;  . TONSILLECTOMY AND ADENOIDECTOMY  1962  . TUBAL LIGATION  1993  . tubaligation  1990   Family History  Problem Relation Age of Onset  . Heart disease Father   . Heart disease Brother   . Cancer Maternal Aunt   . Breast cancer Maternal Aunt   . Stroke Mother   . Kidney disease Neg Hx   . GU problems Neg Hx   . Kidney cancer Neg Hx    Social History   Socioeconomic History  . Marital status: Single    Spouse name: Not on file  .  Number of children: Not on file  . Years of education: Not on file  . Highest education level: Not on file  Occupational History  . Not on file  Tobacco Use  . Smoking status: Never Smoker  . Smokeless tobacco: Never Used  Vaping Use  . Vaping Use: Never used  Substance and Sexual Activity  . Alcohol use: No    Alcohol/week: 0.0 standard drinks  . Drug use: No  . Sexual activity: Not on file  Other Topics Concern  . Not on file  Social History Narrative   Had 2 kids. Daughter died in 01/24/2012 son living    Former Chartered loss adjuster    Social Determinants of Health   Financial Resource Strain:   . Difficulty of Paying Living Expenses: Not on file  Food Insecurity:   . Worried About Charity fundraiser in the Last Year: Not on file  . Ran Out of  Food in the Last Year: Not on file  Transportation Needs:   . Lack of Transportation (Medical): Not on file  . Lack of Transportation (Non-Medical): Not on file  Physical Activity:   . Days of Exercise per Week: Not on file  . Minutes of Exercise per Session: Not on file  Stress:   . Feeling of Stress : Not on file  Social Connections:   . Frequency of Communication with Friends and Family: Not on file  . Frequency of Social Gatherings with Friends and Family: Not on file  . Attends Religious Services: Not on file  . Active Member of Clubs or Organizations: Not on file  . Attends Archivist Meetings: Not on file  . Marital Status: Not on file    Outpatient Encounter Medications as of 07/18/2020  Medication Sig  . aspirin EC 81 MG tablet Take 81 mg by mouth daily.  . carvedilol (COREG) 3.125 MG tablet Take 1.5625 mg by mouth 2 (two) times daily with a meal.   . Cholecalciferol 25 MCG (1000 UT) tablet Take 1,000 Units by mouth 2 (two) times daily.  . clopidogrel (PLAVIX) 75 MG tablet Take 75 mg by mouth daily.  . cyanocobalamin 1000 MCG tablet Take 1,000 mcg by mouth daily at 12 noon.   . diphenhydrAMINE (BENADRYL) 12.5 MG/5ML elixir Take 12.5 mg by mouth every 6 (six) hours as needed (allergic reaction).  Marland Kitchen EPINEPHrine 0.3 mg/0.3 mL IJ SOAJ injection Inject 0.3 mg into the muscle once.   . escitalopram (LEXAPRO) 20 MG tablet TAKE 1 TABLET BY MOUTH ONCE DAILY. (Patient taking differently: Take 20 mg by mouth at bedtime. )  . Fe Fum-FePoly-Vit C-Vit B3 (INTEGRA) 62.5-62.5-40-3 MG CAPS TAKE (1) CAPSULE BY MOUTH ONCE DAILY. (Patient taking differently: Take 1 tablet by mouth 2 (two) times a week. )  . Melatonin-Pyridoxine 3-10 MG TABS Take 1 tablet by mouth at bedtime.  . mesalamine (LIALDA) 1.2 g EC tablet Take 1.2 g by mouth 3 (three) times daily with meals.   . mupirocin ointment (BACTROBAN) 2 % Apply to affected area bid (Patient taking differently: Apply 1 application topically  2 (two) times daily as needed (rash in head). )  . nitroGLYCERIN (NITROSTAT) 0.4 MG SL tablet DISSOLVE (1) TABLET UNDER TONGUE AS NEEDED TO RELIEVE CHEST PAIN. MAYREPEAT EVERY 5 MINUTES. (Patient taking differently: Place 0.4 mg under the tongue every 5 (five) minutes as needed for chest pain. Usual dose is q 5 minutes x 3 doses.)  . ondansetron (ZOFRAN-ODT) 4 MG disintegrating tablet Take 4 mg  by mouth daily as needed for nausea.   . prednisoLONE acetate (PRED FORTE) 1 % ophthalmic suspension Place 1 drop into both eyes 2 (two) times daily as needed (for inflammation).  . rizatriptan (MAXALT) 5 MG tablet USE AS DIRECTED  . rosuvastatin (CRESTOR) 5 MG tablet Take 10 mg by mouth 2 days per week and take 5 mg by mouth all other days.  . valACYclovir (VALTREX) 1000 MG tablet Take 1 tablet (1,000 mg total) by mouth 3 (three) times daily.  . verapamil (CALAN-SR) 120 MG CR tablet Take 120 mg by mouth every evening.   . [DISCONTINUED] rosuvastatin (CRESTOR) 5 MG tablet Take 10 mg by mouth 2 days per week and take 5 mg by mouth all other days. (Patient taking differently: Take 5 mg by mouth daily. )  . pantoprazole (PROTONIX) 20 MG tablet Take 20 mg by mouth daily.    No facility-administered encounter medications on file as of 07/18/2020.    Review of Systems  Constitutional: Negative for appetite change and unexpected weight change.  HENT: Negative for congestion and sinus pressure.   Respiratory: Negative for cough, chest tightness and shortness of breath.   Cardiovascular: Negative for chest pain, palpitations and leg swelling.  Gastrointestinal: Negative for abdominal pain, diarrhea, nausea and vomiting.  Genitourinary: Negative for difficulty urinating and dysuria.  Musculoskeletal: Negative for joint swelling and myalgias.  Skin: Negative for color change and rash.  Neurological: Negative for dizziness, light-headedness and headaches.  Psychiatric/Behavioral: Negative for agitation and dysphoric  mood.       Increased stress as outlined.        Objective:    Physical Exam Constitutional:      General: She is not in acute distress.    Appearance: Normal appearance.  HENT:     Head: Normocephalic and atraumatic.     Right Ear: Tympanic membrane, ear canal and external ear normal.     Left Ear: Tympanic membrane, ear canal and external ear normal.  Eyes:     Extraocular Movements: Extraocular movements intact.     Conjunctiva/sclera: Conjunctivae normal.     Pupils: Pupils are equal, round, and reactive to light.  Neck:     Thyroid: No thyromegaly.  Cardiovascular:     Rate and Rhythm: Normal rate and regular rhythm.  Pulmonary:     Effort: No respiratory distress.     Breath sounds: Normal breath sounds. No wheezing.  Abdominal:     General: Bowel sounds are normal.     Palpations: Abdomen is soft.     Tenderness: There is no abdominal tenderness.  Musculoskeletal:        General: No swelling or tenderness.     Cervical back: Neck supple. No tenderness.  Lymphadenopathy:     Cervical: No cervical adenopathy.  Skin:    Findings: No erythema or rash.  Neurological:     Mental Status: She is alert.  Psychiatric:        Mood and Affect: Mood normal.        Behavior: Behavior normal.     BP 130/72 (BP Location: Left Arm, Patient Position: Sitting)   Pulse 61   Temp 98.3 F (36.8 C)   Ht 5' 2.99" (1.6 m)   Wt 171 lb 12.8 oz (77.9 kg)   SpO2 98%   BMI 30.44 kg/m  Wt Readings from Last 3 Encounters:  07/18/20 171 lb 12.8 oz (77.9 kg)  04/09/20 170 lb (77.1 kg)  03/13/20 173 lb 3.2 oz (  78.6 kg)     Lab Results  Component Value Date   WBC 7.5 03/11/2020   HGB 13.6 03/11/2020   HCT 40.5 03/11/2020   PLT 305.0 03/11/2020   GLUCOSE 90 07/16/2020   CHOL 180 07/16/2020   TRIG 156.0 (H) 07/16/2020   HDL 45.10 07/16/2020   LDLCALC 104 (H) 07/16/2020   ALT 21 07/16/2020   AST 17 07/16/2020   NA 140 07/16/2020   K 4.3 07/16/2020   CL 104 07/16/2020    CREATININE 0.90 07/16/2020   BUN 15 07/16/2020   CO2 29 07/16/2020   TSH 3.45 10/23/2019   INR 1.0 12/23/2017    MM 3D SCREEN BREAST BILATERAL  Result Date: 06/19/2020 CLINICAL DATA:  Screening. EXAM: DIGITAL SCREENING BILATERAL MAMMOGRAM WITH TOMO AND CAD COMPARISON:  Previous exam(s). ACR Breast Density Category c: The breast tissue is heterogeneously dense, which may obscure small masses. FINDINGS: There are no findings suspicious for malignancy. Images were processed with CAD. IMPRESSION: No mammographic evidence of malignancy. A result letter of this screening mammogram will be mailed directly to the patient. RECOMMENDATION: Screening mammogram in one year. (Code:SM-B-01Y) BI-RADS CATEGORY  1: Negative. Electronically Signed   By: Ammie Ferrier M.D.   On: 06/19/2020 11:25       Assessment & Plan:   Problem List Items Addressed This Visit    Vitamin D deficiency    Follow vitamin D level.        Mild depression ([place])    On lexapro.  Doing well.  Has good support.  Follow.       Hypercholesterolemia    Discussed recent labs.  On crestor.  Increase to 92m three days per week and continue 512mall other days.  Follow lipid panel and liver function tests.  Low cholesterol diet and exercise.        Relevant Medications   rosuvastatin (CRESTOR) 5 MG tablet   Other Relevant Orders   Hepatic function panel   Lipid panel   GERD (gastroesophageal reflux disease)    No upper symptoms reported. On protonix.       Essential hypertension    Blood pressure doing better now.  Elevated recently with increased stress.  Continue on calan, coreg.  Follow pressures.  Follow metabolic panel.       Relevant Medications   rosuvastatin (CRESTOR) 5 MG tablet   Other Relevant Orders   TSH   Basic metabolic panel   [place]   Bowels stable.  Followed by GI.       Carotid stenosis    Noted on previous scan.  Schedule f/u carotid ultrasound.  Continue crestor and  aspirin/plavix.        Relevant Medications   rosuvastatin (CRESTOR) 5 MG tablet   Other Relevant Orders   VAS [place]   CAD (coronary artery disease)    Followed by cardiology.  Stable.  Continue risk factor modification.  On crestor low dose.  Increase slightly as outlined.        Relevant Medications   rosuvastatin (CRESTOR) 5 MG tablet   Anemia    S/p resection of distal rectal lesion (Dr ThSheryn Bison  Had recommended f/u.  Saw GI.  S/p colonoscopy.  States was told f/u with Dr ThSheryn Bisonas not necessary.  Follow hgb and iron studies.        Relevant Orders   CBC with Differential/Platelet   IBC + Ferritin       ChEinar PheasantMD

## 2020-07-28 ENCOUNTER — Encounter: Payer: Self-pay | Admitting: Internal Medicine

## 2020-07-28 DIAGNOSIS — I779 Disorder of arteries and arterioles, unspecified: Secondary | ICD-10-CM | POA: Insufficient documentation

## 2020-07-28 DIAGNOSIS — I6529 Occlusion and stenosis of unspecified carotid artery: Secondary | ICD-10-CM | POA: Insufficient documentation

## 2020-07-28 NOTE — Assessment & Plan Note (Signed)
On lexapro.  Doing well.  Has good support.  Follow.

## 2020-07-28 NOTE — Assessment & Plan Note (Signed)
No upper symptoms reported. On protonix.

## 2020-07-28 NOTE — Assessment & Plan Note (Signed)
Discussed recent labs.  On crestor.  Increase to 29m three days per week and continue 534mall other days.  Follow lipid panel and liver function tests.  Low cholesterol diet and exercise.

## 2020-07-28 NOTE — Assessment & Plan Note (Signed)
Blood pressure doing better now.  Elevated recently with increased stress.  Continue on calan, coreg.  Follow pressures.  Follow metabolic panel.

## 2020-07-28 NOTE — Assessment & Plan Note (Signed)
Bowels stable.  Followed by GI.

## 2020-07-28 NOTE — Assessment & Plan Note (Signed)
S/p resection of distal rectal lesion (Dr Sheryn Bison).  Had recommended f/u.  Saw GI.  S/p colonoscopy.  States was told f/u with Dr Sheryn Bison was not necessary.  Follow hgb and iron studies.

## 2020-07-28 NOTE — Assessment & Plan Note (Signed)
Followed by cardiology.  Stable.  Continue risk factor modification.  On crestor low dose.  Increase slightly as outlined.

## 2020-07-28 NOTE — Assessment & Plan Note (Signed)
Noted on previous scan.  Schedule f/u carotid ultrasound.  Continue crestor and aspirin/plavix.

## 2020-07-28 NOTE — Assessment & Plan Note (Signed)
Follow vitamin D level.

## 2020-08-06 ENCOUNTER — Telehealth: Payer: Self-pay | Admitting: Internal Medicine

## 2020-08-06 NOTE — Telephone Encounter (Signed)
Pt is following up on she is supposed to receive a call regarding having her carotid artery check. She also needs a bone density test done, does she need new orders?  Pt also said her pharmacy still hasn't received her prescription for rosuvastatin? She would like a call back.

## 2020-08-07 ENCOUNTER — Other Ambulatory Visit: Payer: Self-pay

## 2020-08-07 MED ORDER — ROSUVASTATIN CALCIUM 5 MG PO TABS: ORAL_TABLET | ORAL | 3 refills | Status: DC

## 2020-08-07 NOTE — Telephone Encounter (Signed)
Carotid artery check being processed, crestor sent in and dexa order is good until December. Pt is aware

## 2020-08-08 ENCOUNTER — Ambulatory Visit: Payer: Medicare PPO | Admitting: Physical Therapy

## 2020-08-08 ENCOUNTER — Other Ambulatory Visit: Payer: Self-pay

## 2020-08-08 ENCOUNTER — Encounter: Payer: Self-pay | Admitting: Physical Therapy

## 2020-08-08 DIAGNOSIS — M6281 Muscle weakness (generalized): Secondary | ICD-10-CM

## 2020-08-08 DIAGNOSIS — R278 Other lack of coordination: Secondary | ICD-10-CM

## 2020-08-08 DIAGNOSIS — R32 Unspecified urinary incontinence: Secondary | ICD-10-CM

## 2020-08-08 NOTE — Therapy (Signed)
[facility] [address] [place], [place], [address] Phone: [phone]   Fax:  [phone]  Physical Therapy Treatment  Patient Details  Name: Morgan Moore MRN: [id number] Date of Birth: April 15, 1956 Referring Provider (PT): Benjaman Kindler   Encounter Date: 08/08/2020   PT End of Session - 08/08/20 1500    Visit Number 6    Number of Visits 12    Date for PT Re-Evaluation 08/12/20    Authorization Type IE 05/20/2020    PT Start Time 1500    PT Stop Time 1530    PT Time Calculation (min) 30 min    Activity Tolerance Patient tolerated treatment well    Behavior During Therapy [facility] for tasks assessed/performed           Past Medical History:  Diagnosis Date  . Allergy   . Anemia   . Anxiety   . Chicken pox   . Complication of anesthesia    vomitting  . Coronary artery disease   . Crohn disease ([place])   . DVT (deep venous thrombosis) ([place]) 2017  . Dyspnea    on excertion  . Dysrhythmia 2019   palpitations  . GERD (gastroesophageal reflux disease)   . Heart disease   . Heart murmur   . Heart murmur   . History of kidney stones   . Hyperlipidemia   . Hypertension   . Inflammatory bowel disease   . Migraines    hormonal, puberty  . Myocardial infarction ([place])   . Phlebitis   . PONV (postoperative nausea and vomiting)     Past Surgical History:  Procedure Laterality Date  . BREAST BIOPSY Left 07/05/2019   FIBROEPITHELIAL lesion/neg  . BREAST CYST ASPIRATION     unsure of side  . CARPAL TUNNEL RELEASE Right 12/02/2017   Procedure: CARPAL TUNNEL RELEASE ENDOSCOPIC;  Surgeon: Corky Mull, MD;  Location: [facility] ORS;  Service: Orthopedics;  Laterality: Right;  . CORONARY ANGIOPLASTY WITH STENT PLACEMENT  2017   x 2  . EYE SURGERY Bilateral    cataract surgery  . GANGLION CYST EXCISION Right 12/02/2017   Procedure: REMOVAL GANGLION OF WRIST;  Surgeon: Corky Mull, MD;  Location: [facility] ORS;  Service:  Orthopedics;  Laterality: Right;  . heart murmur    . HYSTEROSCOPY WITH D & C N/A 04/19/2020   Procedure: DILATATION AND CURETTAGE /HYSTEROSCOPY, POSSIBLE  POLYPECTOMY;  Surgeon: Benjaman Kindler, MD;  Location: [facility] ORS;  Service: Gynecology;  Laterality: N/A;  . TONSILLECTOMY AND ADENOIDECTOMY  1962  . TUBAL LIGATION  1993  . tubaligation  1990    There were no vitals filed for this visit.   Subjective Assessment - 08/08/20 1501    Subjective Patient notes that she has had a lot going on. She does note that she has been out of breath lately and feeling a bit fatigued. Patient states she has a handle on what she needs to do and what isi helping. She notes some inconsistency with her exercises. She feels that she will continue to utilize the exercise; she does not think the problem is solved but she does feel like she is better and is comfortable to self-manage. Additionally, patient is reluctant to be in the community with the pandemic still such a threat and her at risk status.    Currently in Pain? No/denies              TREATMENT  Neuromuscular Re-education: Reviewed HEP and goals.  Patient education on exercise progression for core mm:   Sahrmann Abdominal Rehabilitation phase 1-2  Standing Pilates Abdominal Rehabilitation/Postural Control Exercises    Patient educated throughout session on appropriate technique and form using multi-modal cueing, HEP, and activity modification. Patient articulated understanding and returned demonstration.  Patient Response to interventions: Patient confident to continue independently  ASSESSMENT Patient presents to clinic with excellent motivation to discharge from therapy to self-manage. Patient continues to demonstrate deficits in IAP management, PFM strength, PFM coordination, and bladder habits, but desires discharge secondary to at risk status in the presence of the Covid-19 pandemic and her inability to receive the vaccine. Patient may  benefit from continued skilled therapeutic intervention in the future to address remaining deficits in IAP management, PFM strength, PFM coordination, and bladder habits in order to increase function and improve overall QOL.      PT Long Term Goals - 08/08/20 1535      PT LONG TERM GOAL #1   Title Patient will demonstrate improved function as evidenced by a score of 70 on FOTO measure for full participation in activities at home and in the community.    Baseline IE: 62; 9/30: 67    Time 12    Period Weeks    Status Not Met      PT LONG TERM GOAL #2   Title Patient will demonstrate improved urinary urge suppression as evidenced by ability to delay > 30 minutes to improve participation in the community.    Baseline IE: 10 min; 9/30: 20 min    Time 12    Period Weeks    Status Not Met      PT LONG TERM GOAL #3   Title Patient will report POP causes "no difficulty" with participation in physical and social activities to return to PLOF and improve QOL.    Baseline IE: "a little bit of difficulty"; 9/30: no difficulty    Time 12    Period Weeks    Status Achieved      PT LONG TERM GOAL #4   Title Patient will demonstrate circumferential and sequential contraction of >4/5 MMT, > 6 sec hold x10 and 5 consecutive quick flicks with </= 10 min rest between testing bouts, and relaxation of the PFM coordinated with breath for improved management of intra-abdominal pressure and POP without the presence of pain nor incontinence in order to improve participation at home and in the community.    Baseline IE: not demonstrated; 9/29: 3/5 MMT, 2 sec x3    Time 12    Period Weeks    Status Not Met                 Plan - 08/08/20 1500    Clinical Impression Statement Patient presents to clinic with excellent motivation to discharge from therapy to self-manage. Patient continues to demonstrate deficits in IAP management, PFM strength, PFM coordination, and bladder habits, but desires discharge  secondary to at risk status in the presence of the Covid-19 pandemic and her inability to receive the vaccine. Patient may benefit from continued skilled therapeutic intervention in the future to address remaining deficits in IAP management, PFM strength, PFM coordination, and bladder habits in order to increase function and improve overall QOL.    Personal Factors and Comorbidities Age;Behavior Pattern;Comorbidity 3+;Fitness;Past/Current Experience;Time since onset of injury/illness/exacerbation    Comorbidities HTN, migraines, CAD, GERD, Crohn disease, osteoporosis, leg length inequality, mild depression, hx of DVT, anxiety    Examination-Activity Limitations Continence;Carry;Stairs;Squat;Lift;Bend  Examination-Participation Restrictions Interpersonal Relationship;Yard Work;Cleaning;Laundry;Community Activity    Stability/Clinical Decision Making Evolving/Moderate complexity    Rehab Potential Fair    PT Frequency 1x / week    PT Duration 12 weeks    PT Treatment/Interventions Electrical Stimulation;ADLs/Self Care Home Management;Biofeedback;Moist Heat;Cryotherapy;Therapeutic activities;Functional mobility training;Stair training;Gait training;Therapeutic exercise;Balance training;Neuromuscular re-education;Taping;Dry needling;Passive range of motion;Manual techniques;Orthotic Fit/Training;Patient/family education;Scar mobilization;Spinal Manipulations;Joint Manipulations    PT Next Visit Plan --    PT Home Exercise Plan --    Consulted and Agree with Plan of Care Patient           Patient will benefit from skilled therapeutic intervention in order to improve the following deficits and impairments:  Decreased endurance, Difficulty walking, Improper body mechanics, Decreased range of motion, Decreased activity tolerance, Decreased coordination, Decreased strength, Postural dysfunction, Decreased balance  Visit Diagnosis: Other lack of coordination  Muscle weakness (generalized)  Urinary  incontinence, unspecified type     Problem List Patient Active Problem List   Diagnosis Date Noted  . Carotid stenosis 07/28/2020  . Leg skin lesion, right 03/24/2020  . Shingles 12/10/2019  . Abnormal mammogram 12/02/2019  . Thickened endometrium 10/29/2019  . Fluid in endometrial cavity 01/08/2019  . Vitamin D deficiency 01/18/2018  . Iron deficiency anemia secondary to inadequate dietary iron intake 01/17/2018  . Easy bruising 12/23/2017  . Anemia 12/23/2017  . Flexor carpi ulnaris tenosynovitis 12/03/2017  . Carpal tunnel syndrome, right 10/15/2017  . Ganglion of right wrist 10/15/2017  . Leg length inequality 10/15/2017  . Lumbar spondylosis 10/15/2017  . Osteoporosis without current pathological fracture 09/09/2017  . Heart murmur 09/09/2017  . Mild depression ([place]) 09/09/2017  . Myocardial infarction ([place])   . Crohn disease ([place])   . Chest pain 04/12/2017  . Right arm pain 01/16/2017  . History of frequent urinary tract infections 08/09/2016  . Heart palpitations 08/03/2016  . Weakness 07/02/2016  . CAD (coronary artery disease) 04/17/2016  . Light headedness 04/17/2016  . Hip pain 04/13/2016  . Health care maintenance 01/19/2016  . Essential hypertension 11/07/2015  . Hypercholesterolemia 11/07/2015  . Migraine headache 11/07/2015  . Nephrolithiasis 11/07/2015  . GERD (gastroesophageal reflux disease) 11/07/2015  . Mitral regurgitation 11/07/2015  . Stress 11/07/2015  . Varicose veins of both lower extremities with pain 05/10/2015  . Pharyngoesophageal dysphagia 03/11/2015  . Pancreatic lesion 04/25/2014  . Hemangioma of liver 03/12/2014  . Arthralgia 05/29/2011   Myles Gip PT, DPT [phone]  08/08/2020, 3:47 PM  [facility] [address] [place], [place], [address] Phone: [phone]   Fax:  [phone]  Name: Morgan Moore MRN: [id number] Date of Birth: 1956-07-08

## 2020-09-03 NOTE — Telephone Encounter (Signed)
The order is under CV procedure - because it is a vascular procedure.  (it was ordered 07/28/20).

## 2020-09-03 NOTE — Telephone Encounter (Signed)
Pt is checking on why she hasn't received a call about having her carotid artery checked? I let her know I will have someone check on this for her and give her a call back.

## 2020-09-03 NOTE — Telephone Encounter (Signed)
I cannot see order for Carotid artery check or doppler?

## 2020-09-04 NOTE — Telephone Encounter (Signed)
Good afternoon!  The order is under active requests. It has been faxed to [facility]. They will contact pt to schedule.

## 2020-09-05 NOTE — Telephone Encounter (Signed)
Patient aware.

## 2020-09-17 ENCOUNTER — Ambulatory Visit
Admission: RE | Admit: 2020-09-17 | Discharge: 2020-09-17 | Disposition: A | Discharge status: Home / Self Care | Payer: Medicare PPO | Source: Ambulatory Visit | Attending: Internal Medicine | Admitting: Internal Medicine

## 2020-09-17 ENCOUNTER — Other Ambulatory Visit: Payer: Self-pay

## 2020-09-17 DIAGNOSIS — E2839 Other primary ovarian failure: Secondary | ICD-10-CM | POA: Insufficient documentation

## 2020-09-17 DIAGNOSIS — Z780 Asymptomatic menopausal state: Secondary | ICD-10-CM | POA: Diagnosis not present

## 2020-09-17 DIAGNOSIS — M8589 Other specified disorders of bone density and structure, multiple sites: Secondary | ICD-10-CM | POA: Diagnosis not present

## 2020-09-17 DIAGNOSIS — R29890 Loss of height: Secondary | ICD-10-CM | POA: Diagnosis not present

## 2020-09-25 DIAGNOSIS — K219 Gastro-esophageal reflux disease without esophagitis: Secondary | ICD-10-CM | POA: Diagnosis not present

## 2020-09-25 DIAGNOSIS — K5010 Crohn's disease of large intestine without complications: Secondary | ICD-10-CM | POA: Diagnosis not present

## 2020-09-25 DIAGNOSIS — D508 Other iron deficiency anemias: Secondary | ICD-10-CM | POA: Diagnosis not present

## 2020-10-07 ENCOUNTER — Ambulatory Visit (INDEPENDENT_AMBULATORY_CARE_PROVIDER_SITE_OTHER): Payer: Medicare PPO

## 2020-10-07 ENCOUNTER — Other Ambulatory Visit: Payer: Self-pay

## 2020-10-07 ENCOUNTER — Encounter (INDEPENDENT_AMBULATORY_CARE_PROVIDER_SITE_OTHER): Payer: Self-pay | Admitting: Vascular Surgery

## 2020-10-07 ENCOUNTER — Ambulatory Visit (INDEPENDENT_AMBULATORY_CARE_PROVIDER_SITE_OTHER): Payer: Medicare PPO | Admitting: Vascular Surgery

## 2020-10-07 VITALS — BP 126/74 | HR 68 | Ht 61.0 in | Wt 168.0 lb

## 2020-10-07 DIAGNOSIS — I10 Essential (primary) hypertension: Secondary | ICD-10-CM

## 2020-10-07 DIAGNOSIS — I6529 Occlusion and stenosis of unspecified carotid artery: Secondary | ICD-10-CM

## 2020-10-07 DIAGNOSIS — E7800 Pure hypercholesterolemia, unspecified: Secondary | ICD-10-CM | POA: Diagnosis not present

## 2020-10-07 DIAGNOSIS — I2510 Atherosclerotic heart disease of native coronary artery without angina pectoris: Secondary | ICD-10-CM

## 2020-10-07 DIAGNOSIS — I83813 Varicose veins of bilateral lower extremities with pain: Secondary | ICD-10-CM | POA: Diagnosis not present

## 2020-10-08 ENCOUNTER — Encounter (INDEPENDENT_AMBULATORY_CARE_PROVIDER_SITE_OTHER): Payer: Self-pay | Admitting: Vascular Surgery

## 2020-10-08 NOTE — Progress Notes (Signed)
MRN : [id number]  Morgan Moore is a 64 y.o. (Jul 31, 1956) female who presents with chief complaint of  Chief Complaint  Moore presents with  . New Moore (Initial Visit)    Morgan Moore  .  History of Present Illness:   The Moore is seen for evaluation of carotid stenosis. The carotid stenosis was identified after Dr. Nicki Moore auscultated a carotid bruit.  The Moore denies amaurosis fugax. There is no recent history of TIA symptoms or focal motor deficits. There is no prior documented CVA.  There is no history of migraine headaches. There is no history of seizures.  The Moore is taking enteric-coated aspirin 81 mg daily.  The Moore has a history of coronary artery disease, no recent episodes of angina or shortness of breath. The Moore denies PAD or claudication symptoms. There is a history of hyperlipidemia which is being treated with a statin.  Duplex ultrasound carotid arteries demonstrates no evidence of stenosis in the right ICA and 40 to 59% stenosis in the left ICA.  Vertebral arteries are antegrade bilaterally.   Current Meds  Medication Sig  . aspirin EC 81 MG tablet Take 81 mg by mouth daily.  . carvedilol (COREG) 3.125 MG tablet Take 1.5625 mg by mouth 2 (two) times daily with a meal.   . Cholecalciferol 25 MCG (1000 UT) tablet Take 1,000 Units by mouth 2 (two) times daily.  . clopidogrel (PLAVIX) 75 MG tablet Take 75 mg by mouth daily.  . cyanocobalamin 1000 MCG tablet Take 1,000 mcg by mouth daily at 12 noon.   . diphenhydrAMINE (BENADRYL) 12.5 MG/5ML elixir Take 12.5 mg by mouth every 6 (six) hours as needed (allergic reaction).  Marland Kitchen EPINEPHrine 0.3 mg/0.3 mL IJ SOAJ injection Inject 0.3 mg into the muscle once.   . escitalopram (LEXAPRO) 20 MG tablet TAKE 1 TABLET BY MOUTH ONCE DAILY. (Moore taking differently: Take 20 mg by mouth at bedtime. )  . Fe Fum-FePoly-Vit C-Vit B3 (INTEGRA) 62.5-62.5-40-3 MG CAPS TAKE (1) CAPSULE BY MOUTH ONCE DAILY.  (Moore taking differently: Take 1 tablet by mouth 2 (two) times a week. )  . Melatonin-Pyridoxine 3-10 MG TABS Take 1 tablet by mouth at bedtime.  . mesalamine (LIALDA) 1.2 g EC tablet Take 1.2 g by mouth 3 (three) times daily with meals.   . mupirocin ointment (BACTROBAN) 2 % Apply to affected area bid (Moore taking differently: Apply 1 application topically 2 (two) times daily as needed (rash in head). )  . nitroGLYCERIN (NITROSTAT) 0.4 MG SL tablet DISSOLVE (1) TABLET UNDER TONGUE AS NEEDED TO RELIEVE CHEST PAIN. MAYREPEAT EVERY 5 MINUTES. (Moore taking differently: Place 0.4 mg under the tongue every 5 (five) minutes as needed for chest pain. Usual dose is q 5 minutes x 3 doses.)  . ondansetron (ZOFRAN-ODT) 4 MG disintegrating tablet Take 4 mg by mouth daily as needed for nausea.   . prednisoLONE acetate (PRED FORTE) 1 % ophthalmic suspension Place 1 drop into both eyes 2 (two) times daily as needed (for inflammation).  . rizatriptan (MAXALT) 5 MG tablet USE AS DIRECTED  . rosuvastatin (CRESTOR) 5 MG tablet Take 10 mg by mouth 2 days per week and take 5 mg by mouth all other days.  . verapamil (CALAN-SR) 120 MG CR tablet Take 120 mg by mouth every evening.     Past Medical History:  Diagnosis Date  . Allergy   . Anemia   . Anxiety   . Chicken pox   . Complication of  anesthesia    vomitting  . Coronary artery disease   . Crohn disease (Morgan Moore)   . DVT (deep venous thrombosis) (Morgan Moore) 2017  . Dyspnea    on excertion  . Dysrhythmia 2019   palpitations  . GERD (gastroesophageal reflux disease)   . Heart disease   . Heart murmur   . Heart murmur   . History of kidney stones   . Hyperlipidemia   . Hypertension   . Inflammatory bowel disease   . Migraines    hormonal, puberty  . Myocardial infarction (Morgan Moore)   . Phlebitis   . PONV (postoperative nausea and vomiting)     Past Surgical History:  Procedure Laterality Date  . BREAST BIOPSY Left 07/05/2019   FIBROEPITHELIAL  lesion/neg  . BREAST CYST ASPIRATION     unsure of side  . CARPAL TUNNEL RELEASE Right 12/02/2017   Procedure: CARPAL TUNNEL RELEASE ENDOSCOPIC;  Surgeon: Morgan Mull, MD;  Location: [facility] ORS;  Service: Orthopedics;  Laterality: Right;  . CORONARY ANGIOPLASTY WITH STENT PLACEMENT  2017   x 2  . EYE SURGERY Bilateral    cataract surgery  . GANGLION CYST EXCISION Right 12/02/2017   Procedure: REMOVAL GANGLION OF WRIST;  Surgeon: Morgan Mull, MD;  Location: [facility] ORS;  Service: Orthopedics;  Laterality: Right;  . heart murmur    . HYSTEROSCOPY WITH D & C N/A 04/19/2020   Procedure: DILATATION AND CURETTAGE /HYSTEROSCOPY, POSSIBLE  POLYPECTOMY;  Surgeon: Morgan Kindler, MD;  Location: [facility] ORS;  Service: Gynecology;  Laterality: N/A;  . TONSILLECTOMY AND ADENOIDECTOMY  1962  . TUBAL LIGATION  1993  . tubaligation  1990    Social History Social History   Tobacco Use  . Smoking status: Never Smoker  . Smokeless tobacco: Never Used  Vaping Use  . Vaping Use: Never used  Substance Use Topics  . Alcohol use: No    Alcohol/week: 0.0 standard drinks  . Drug use: No    Family History Family History  Problem Relation Age of Onset  . Heart disease Father   . Heart disease Brother   . Cancer Maternal Aunt   . Breast cancer Maternal Aunt   . Stroke Mother   . Kidney disease Neg Hx   . GU problems Neg Hx   . Kidney cancer Neg Hx   No family history of bleeding/clotting disorders, porphyria or autoimmune disease   Allergies  Allergen Reactions  . Ciprofloxacin Anaphylaxis  . Keflex [Cephalexin] Anaphylaxis  . Lisinopril Anaphylaxis  . Meclizine Anaphylaxis  . Monurol [Fosfomycin Tromethamine  (Obsolete)] Rash    Splotchy rash all over body. Breathing became difficult/tightened. Took benadryl immediately so she did not swell   . Sulfa Antibiotics Anaphylaxis  . Tessalon [Benzonatate] Anaphylaxis  . Flu Virus Vaccine Other (See Comments)    History of Morgan Moore  . Codeine  Nausea And Vomiting     REVIEW OF SYSTEMS (Negative unless checked)  Constitutional: [] Weight loss  [] Fever  [] Chills Cardiac: [] Chest pain   [] Chest pressure   [] Palpitations   [] Shortness of breath when laying flat   [] Shortness of breath with exertion. Vascular:  [] Pain in legs with walking   [x] Pain in legs at rest  [] History of DVT   [] Phlebitis   [x] Swelling in legs   [x] Varicose veins   [] Non-healing ulcers Pulmonary:   [] Uses home oxygen   [] Productive cough   [] Hemoptysis   [] Wheeze  [] COPD   [] Asthma Neurologic:  [] Dizziness   [] Seizures   []   History of stroke   [] History of TIA  [] Aphasia   [] Vissual changes   [] Weakness or numbness in arm   [] Weakness or numbness in leg Musculoskeletal:   [] Joint swelling   [x] Joint pain   [] Low back pain Hematologic:  [] Easy bruising  [] Easy bleeding   [] Hypercoagulable state   [] Anemic Gastrointestinal:  [] Diarrhea   [] Vomiting  [x] Gastroesophageal reflux/heartburn   [] Difficulty swallowing. Genitourinary:  [] Chronic kidney disease   [] Difficult urination  [] Frequent urination   [] Blood in urine Skin:  [] Rashes   [] Ulcers  Psychological:  [] History of anxiety   []  History of major depression.  Physical Examination  Vitals:   10/07/20 1123  BP: 126/74  Pulse: 68  Weight: 168 lb (76.2 kg)  Height: 5' 1"  (1.549 m)   Body mass index is 31.74 kg/m. Gen: WD/WN, NAD Head: [place]/AT, No temporalis wasting.  Ear/Nose/Throat: Hearing grossly intact, nares w/o erythema or drainage, poor dentition Eyes: PER, EOMI, sclera nonicteric.  Neck: Supple, no masses.  No bruit or JVD.  Pulmonary:  Good air movement, clear to auscultation bilaterally, no use of accessory muscles.  Cardiac: RRR, normal S1, S2, no Murmurs. Vascular: Left carotid bruit auscultated.  Large varicosities present extensively greater than 6-8 mm bilaterally.  Mild venous stasis changes to the legs bilaterally.  2+ soft pitting edema. Vessel Right Left  Radial Palpable Palpable   Brachial Palpable Palpable  Carotid Palpable Palpable  Gastrointestinal: soft, non-distended. No guarding/no peritoneal signs.  Musculoskeletal: M/S 5/5 throughout.  No deformity or atrophy.  Neurologic: CN 2-12 intact. Pain and light touch intact in extremities.  Symmetrical.  Speech is fluent. Motor exam as listed above. Psychiatric: Judgment intact, Mood & affect appropriate for pt's clinical situation. Dermatologic: Mild venous rashes no ulcers noted.  No changes consistent with cellulitis.   CBC Lab Results  Component Value Date   WBC 7.5 03/11/2020   HGB 13.6 03/11/2020   HCT 40.5 03/11/2020   MCV 96.3 03/11/2020   PLT 305.0 03/11/2020    BMET    Component Value Date/Time   NA 140 07/16/2020 0902   NA 135 02/25/2015 0950   K 4.3 07/16/2020 0902   K 3.8 02/25/2015 0950   CL 104 07/16/2020 0902   CL 97 (L) 02/25/2015 0950   CO2 29 07/16/2020 0902   CO2 29 02/25/2015 0950   GLUCOSE 90 07/16/2020 0902   GLUCOSE 100 (H) 02/25/2015 0950   BUN 15 07/16/2020 0902   BUN 12 02/25/2015 0950   CREATININE 0.90 07/16/2020 0902   CREATININE 0.83 02/25/2015 0950   CALCIUM 9.3 07/16/2020 0902   CALCIUM 8.9 02/25/2015 0950   GFRNONAA >60 11/19/2017 1217   GFRNONAA >60 02/25/2015 0950   GFRAA >60 11/19/2017 1217   GFRAA >60 02/25/2015 0950   CrCl cannot be calculated (Moore's most recent lab result is older than the maximum 21 days allowed.).  COAG Lab Results  Component Value Date   INR 1.0 12/23/2017   INR 1.1 09/21/2014    Radiology DG Bone Density  Result Date: 09/17/2020 EXAM: DUAL X-RAY ABSORPTIOMETRY (DXA) FOR BONE MINERAL DENSITY IMPRESSION: Your Moore Morgan Moore completed a BMD test on 09/17/2020 using the [place] (software version: 14.10) manufactured by [employer]. The following summarizes the results of our evaluation. Technologist:VLM Moore BIOGRAPHICAL: Name: Morgan Moore, Morgan Moore Moore ID: [id number] Birth Date: July 21, 1956  Height: 61.5 in. Gender: Female Exam Date: 09/17/2020 Weight: 171.8 lbs. Indications: Caucasian, Height Loss, Postmenopausal Fractures: Treatments: Vitamin D DENSITOMETRY  RESULTS: Site      Region    Measured Date Measured Age WHO Classification Young Adult T-score BMD         %Change vs. Previous Significant Change (*) AP Spine L1-L4 09/17/2020 65.5 Osteopenia -1.3 1.028 g/cm2 - - DualFemur Neck Left 09/17/2020 65.5 Normal -1.0 0.899 g/cm2 - - ASSESSMENT: The BMD measured at AP Spine L1-L4 is 1.028 g/cm2 with a T-score of -1.3. This Moore is considered osteopenic according to [facility]) criteria. The scan quality is good. World Pharmacologist [facility]) criteria for post-menopausal, Caucasian Women: Normal:                   T-score at or above -1 SD Osteopenia/low bone mass: T-score between -1 and -2.5 SD Osteoporosis:             T-score at or below -2.5 SD RECOMMENDATIONS: 1. All patients should optimize calcium and vitamin D intake. 2. Consider FDA-approved medical therapies in postmenopausal women and men aged 73 years and older, based on the following: a. A hip or vertebral(clinical or morphometric) fracture b. T-score < -2.5 at the femoral neck or spine after appropriate evaluation to exclude secondary causes c. Low bone mass (T-score between -1.0 and -2.5 at the femoral neck or spine) and a 10-year probability of a hip fracture > 3% or a 10-year probability of a major osteoporosis-related fracture > 20% based on the [place]-adapted WHO algorithm 3. Clinician judgment and/or Moore preferences may indicate treatment for people with 10-year fracture probabilities above or below these levels FOLLOW-UP: People with diagnosed cases of osteoporosis or at high risk for fracture should have regular bone mineral density tests. For patients eligible for Medicare, routine testing is allowed once every 2 years. The testing frequency can be increased to one year for patients who have rapidly  progressing disease, those who are receiving or discontinuing medical therapy to restore bone mass, or have additional risk factors. I have reviewed this report, and agree with the above findings. [facility] Dear Morgan Moore, Your Moore Morgan Moore completed a FRAX assessment on 09/17/2020 using the [place] (analysis version: 14.10) manufactured by [employer]. The following summarizes the results of our evaluation. Moore BIOGRAPHICAL: Name: Morgan Moore, Morgan Moore Moore ID: [id number] Birth Date: 09-Mar-1956 Height:    61.5 in. Gender:     Female    [age]       Weight:    171.8 lbs. Ethnicity:  White                            Exam Date: 09/17/2020 FRAX* RESULTS:  (version: 3.5) 10-year Probability of Fracture1 Major Osteoporotic Fracture2 Hip Fracture 7.7% 0.6% Population: [place] (Caucasian) Risk Factors: None Based on Femur (Left) Neck BMD 1 -The 10-year probability of fracture may be lower than reported if the Moore has received treatment. 2 -Major Osteoporotic Fracture: Clinical Spine, Forearm, Hip or Shoulder *FRAX is a Materials engineer of the [employer] of [employer] for Metabolic Bone Disease, a [place] (WHO) [facility]. ASSESSMENT: The probability of a major osteoporotic fracture is 7.7% within the next ten years. The probability of a hip fracture is 0.6% within the next ten years. . Electronically Signed   By: Rolm Baptise M.D.   On: 09/17/2020 16:12     Assessment/Plan 1. Stenosis of carotid artery, unspecified laterality Recommend:  Given the Moore's asymptomatic subcritical stenosis no further invasive testing or surgery at this time.  Duplex ultrasound shows RICA <79% and LICA 02-40% stenosis.  Continue antiplatelet therapy as prescribed Continue management of CAD, HTN and Hyperlipidemia Healthy heart diet,  encouraged exercise at least 4 times per week Follow up in 6 months with duplex  ultrasound and physical exam.   - VAS US CAROTID; Future  2. Varicose veins of both lower extremities with pain  Recommend:  The Moore has large symptomatic varicose veins that are painful and associated with swelling.  I have had a long discussion with the Moore regarding  varicose veins and why they cause symptoms.  Moore will begin wearing graduated compression stockings class 1 on a daily basis, beginning first thing in the morning and removing them in the evening. The Moore is instructed specifically not to sleep in the stockings.    The Moore  will also begin using over-the-counter analgesics such as Motrin 600 mg po TID to help control the symptoms.    In addition, behavioral modification including elevation during the day will be initiated.    Pending the results of these changes the  Moore will be reevaluated in 6 months.   An  ultrasound of the venous system will be obtained.   Further plans will be based on the ultrasound results and whether conservative therapies are successful at eliminating the pain and swelling.  - VAS [place] LOWER EXTREMITY VENOUS REFLUX; Future  3. Coronary artery disease involving native coronary artery of native heart without angina pectoris Continue cardiac and antihypertensive medications as already ordered and reviewed, no changes at this time.  Continue statin as ordered and reviewed, no changes at this time  Nitrates PRN for chest pain   4. Essential hypertension Continue antihypertensive medications as already ordered, these medications have been reviewed and there are no changes at this time.   5. Hypercholesterolemia Continue statin as ordered and reviewed, no changes at this time     Hortencia Pilar, MD  10/08/2020 12:32 PM

## 2020-11-14 ENCOUNTER — Other Ambulatory Visit (INDEPENDENT_AMBULATORY_CARE_PROVIDER_SITE_OTHER): Payer: Medicare PPO

## 2020-11-14 ENCOUNTER — Other Ambulatory Visit: Payer: Self-pay

## 2020-11-14 DIAGNOSIS — E7800 Pure hypercholesterolemia, unspecified: Secondary | ICD-10-CM

## 2020-11-14 DIAGNOSIS — D509 Iron deficiency anemia, unspecified: Secondary | ICD-10-CM

## 2020-11-14 DIAGNOSIS — I10 Essential (primary) hypertension: Secondary | ICD-10-CM

## 2020-11-14 LAB — IBC + FERRITIN
Ferritin: 72.3 ng/mL (ref 10.0–291.0)
Iron: 83 ug/dL (ref 42–145)
Saturation Ratios: 29.6 % (ref 20.0–50.0)
Transferrin: 200 mg/dL — ABNORMAL LOW (ref 212.0–360.0)

## 2020-11-14 LAB — LIPID PANEL
Cholesterol: 172 mg/dL (ref 0–200)
HDL: 40.8 mg/dL (ref >39.00)
LDL Cholesterol: 102 mg/dL — ABNORMAL HIGH (ref 0–99)
NonHDL: 130.92
Total CHOL/HDL Ratio: 4
Triglycerides: 146 mg/dL (ref 0.0–149.0)
VLDL: 29.2 mg/dL (ref 0.0–40.0)

## 2020-11-14 LAB — HEPATIC FUNCTION PANEL
ALT: 14 U/L (ref 0–35)
AST: 15 U/L (ref 0–37)
Albumin: 4.3 g/dL (ref 3.5–5.2)
Alkaline Phosphatase: 80 U/L (ref 39–117)
Bilirubin, Direct: 0.1 mg/dL (ref 0.0–0.3)
Total Bilirubin: 0.5 mg/dL (ref 0.2–1.2)
Total Protein: 6.6 g/dL (ref 6.0–8.3)

## 2020-11-14 LAB — CBC WITH DIFFERENTIAL/PLATELET
Basophils Absolute: 0.1 10*3/uL (ref 0.0–0.1)
Basophils Relative: 0.8 % (ref 0.0–3.0)
Eosinophils Absolute: 0.4 10*3/uL (ref 0.0–0.7)
Eosinophils Relative: 4.7 % (ref 0.0–5.0)
HCT: 39.8 % (ref 36.0–46.0)
Hemoglobin: 13.5 g/dL (ref 12.0–15.0)
Lymphocytes Relative: 14.6 % (ref 12.0–46.0)
Lymphs Abs: 1.2 10*3/uL (ref 0.7–4.0)
MCHC: 33.8 g/dL (ref 30.0–36.0)
MCV: 93 fl (ref 78.0–100.0)
Monocytes Absolute: 0.9 10*3/uL (ref 0.1–1.0)
Monocytes Relative: 10.3 % (ref 3.0–12.0)
Neutro Abs: 5.8 10*3/uL (ref 1.4–7.7)
Neutrophils Relative %: 69.6 % (ref 43.0–77.0)
Platelets: 341 10*3/uL (ref 150.0–400.0)
RBC: 4.28 Mil/uL (ref 3.87–5.11)
RDW: 13.6 % (ref 11.5–15.5)
WBC: 8.4 10*3/uL (ref 4.0–10.5)

## 2020-11-14 LAB — BASIC METABOLIC PANEL
BUN: 13 mg/dL (ref 6–23)
CO2: 29 mEq/L (ref 19–32)
Calcium: 9.5 mg/dL (ref 8.4–10.5)
Chloride: 104 mEq/L (ref 96–112)
Creatinine, Ser: 0.83 mg/dL (ref 0.40–1.20)
GFR: 73.81 mL/min (ref >60.00)
Glucose, Bld: 95 mg/dL (ref 70–99)
Potassium: 4.1 mEq/L (ref 3.5–5.1)
Sodium: 140 mEq/L (ref 135–145)

## 2020-11-14 LAB — TSH: TSH: 2.4 u[IU]/mL (ref 0.35–4.50)

## 2020-11-15 ENCOUNTER — Other Ambulatory Visit: Payer: Medicare PPO

## 2020-11-19 ENCOUNTER — Encounter: Payer: Self-pay | Admitting: Internal Medicine

## 2020-11-19 ENCOUNTER — Ambulatory Visit (INDEPENDENT_AMBULATORY_CARE_PROVIDER_SITE_OTHER): Payer: Medicare PPO | Admitting: Internal Medicine

## 2020-11-19 ENCOUNTER — Other Ambulatory Visit: Payer: Self-pay

## 2020-11-19 DIAGNOSIS — E559 Vitamin D deficiency, unspecified: Secondary | ICD-10-CM

## 2020-11-19 DIAGNOSIS — D509 Iron deficiency anemia, unspecified: Secondary | ICD-10-CM

## 2020-11-19 DIAGNOSIS — M545 Low back pain, unspecified: Secondary | ICD-10-CM

## 2020-11-19 DIAGNOSIS — I2510 Atherosclerotic heart disease of native coronary artery without angina pectoris: Secondary | ICD-10-CM

## 2020-11-19 DIAGNOSIS — R9389 Abnormal findings on diagnostic imaging of other specified body structures: Secondary | ICD-10-CM

## 2020-11-19 DIAGNOSIS — K219 Gastro-esophageal reflux disease without esophagitis: Secondary | ICD-10-CM

## 2020-11-19 DIAGNOSIS — F439 Reaction to severe stress, unspecified: Secondary | ICD-10-CM

## 2020-11-19 DIAGNOSIS — F320 Major depressive disorder, single episode, mild: Secondary | ICD-10-CM | POA: Diagnosis not present

## 2020-11-19 DIAGNOSIS — K50919 Crohn's disease, unspecified, with unspecified complications: Secondary | ICD-10-CM

## 2020-11-19 DIAGNOSIS — I10 Essential (primary) hypertension: Secondary | ICD-10-CM | POA: Diagnosis not present

## 2020-11-19 DIAGNOSIS — F32A Depression, unspecified: Secondary | ICD-10-CM

## 2020-11-19 DIAGNOSIS — E7800 Pure hypercholesterolemia, unspecified: Secondary | ICD-10-CM

## 2020-11-19 DIAGNOSIS — I6529 Occlusion and stenosis of unspecified carotid artery: Secondary | ICD-10-CM | POA: Diagnosis not present

## 2020-11-19 DIAGNOSIS — B351 Tinea unguium: Secondary | ICD-10-CM

## 2020-11-19 MED ORDER — ROSUVASTATIN CALCIUM 5 MG PO TABS: ORAL_TABLET | ORAL | 4 refills | Status: DC

## 2020-11-19 MED ORDER — CICLOPIROX 8 % EX SOLN: Freq: Every day | CUTANEOUS | 1 refills | Status: DC

## 2020-11-19 NOTE — Progress Notes (Signed)
Patient ID: Morgan Moore, female   DOB: 1956-05-04, 65 y.o.   MRN: [id number]   Subjective:    Patient ID: Morgan Moore, female    DOB: [birth year], 65 y.o.   MRN: [id number]  HPI This visit occurred during the SARS-CoV-2 public health emergency.  Safety protocols were in place, including screening questions prior to the visit, additional usage of staff PPE, and extensive cleaning of exam room while observing appropriate contact time as indicated for disinfecting solutions.  Patient here for a scheduled follow up.  Here to follow up regarding her blood pressure and cholesterol.  Has IBD - Crohn's. .  Saw GI 09/25/20.  Due colonoscopy 2023.  On lialda.  Bowels stable.  Carotid - RICA <82% and LICA 50-03%.  Recommended f/u duplex in 6 months.  Stays active.  No chest pain reported.  Breathing stable.  Occasionally will feel quiver.  More upper body.  Only last a few seconds.  No tremor.  No fever or chills.  Just occurs occasionally.  Some left lower back pain - intermittent.  PT has helped. Discussed exercises.  Sore spot - low abdomen.  Tender to palpation.  No other abdominal pain.  Discussed labs.  Discussed adjusting crestor dose.  Concern regarding fungus - second toe. Wants to try penlac.    Past Medical History:  Diagnosis Date  . Allergy   . Anemia   . Anxiety   . Chicken pox   . Complication of anesthesia    vomitting  . Coronary artery disease   . Crohn disease ([place])   . DVT (deep venous thrombosis) ([place]) 2017  . Dyspnea    on excertion  . Dysrhythmia 2019   palpitations  . GERD (gastroesophageal reflux disease)   . Heart disease   . Heart murmur   . Heart murmur   . History of kidney stones   . Hyperlipidemia   . Hypertension   . Inflammatory bowel disease   . Migraines    hormonal, puberty  . Myocardial infarction ([place])   . Phlebitis   . PONV (postoperative nausea and vomiting)    Past Surgical History:  Procedure Laterality Date  . BREAST BIOPSY Left  07/05/2019   FIBROEPITHELIAL lesion/neg  . BREAST CYST ASPIRATION     unsure of side  . CARPAL TUNNEL RELEASE Right 12/02/2017   Procedure: CARPAL TUNNEL RELEASE ENDOSCOPIC;  Surgeon: Corky Mull, MD;  Location: [facility] ORS;  Service: Orthopedics;  Laterality: Right;  . CORONARY ANGIOPLASTY WITH STENT PLACEMENT  2017   x 2  . EYE SURGERY Bilateral    cataract surgery  . GANGLION CYST EXCISION Right 12/02/2017   Procedure: REMOVAL GANGLION OF WRIST;  Surgeon: Corky Mull, MD;  Location: [facility] ORS;  Service: Orthopedics;  Laterality: Right;  . heart murmur    . HYSTEROSCOPY WITH D & C N/A 04/19/2020   Procedure: DILATATION AND CURETTAGE /HYSTEROSCOPY, POSSIBLE  POLYPECTOMY;  Surgeon: Benjaman Kindler, MD;  Location: [facility] ORS;  Service: Gynecology;  Laterality: N/A;  . TONSILLECTOMY AND ADENOIDECTOMY  1962  . TUBAL LIGATION  1993  . tubaligation  1990   Family History  Problem Relation Age of Onset  . Heart disease Father   . Heart disease Brother   . Cancer Maternal Aunt   . Breast cancer Maternal Aunt   . Stroke Mother   . Kidney disease Neg Hx   . GU problems Neg Hx   . Kidney cancer Neg Hx    Social  History   Socioeconomic History  . Marital status: Single    Spouse name: Not on file  . Number of children: Not on file  . Years of education: Not on file  . Highest education level: Not on file  Occupational History  . Not on file  Tobacco Use  . Smoking status: Never Smoker  . Smokeless tobacco: Never Used  Vaping Use  . Vaping Use: Never used  Substance and Sexual Activity  . Alcohol use: No    Alcohol/week: 0.0 standard drinks  . Drug use: No  . Sexual activity: Not on file  Other Topics Concern  . Not on file  Social History Narrative   Had 2 kids. Daughter died in 2012/01/24 son living    Former Chartered loss adjuster    Social Determinants of Health   Financial Resource Strain: Not on file  Food Insecurity: Not on file  Transportation Needs: Not on file  Physical  Activity: Not on file  Stress: Not on file  Social Connections: Not on file    Outpatient Encounter Medications as of 11/19/2020  Medication Sig  . ciclopirox (PENLAC) 8 % solution Apply topically at bedtime. Apply over nail and surrounding skin. Apply daily over previous coat. After seven (7) days, may remove with alcohol and continue cycle.  Marland Kitchen aspirin EC 81 MG tablet Take 81 mg by mouth daily.  . carvedilol (COREG) 3.125 MG tablet Take 1.5625 mg by mouth 2 (two) times daily with a meal.   . Cholecalciferol 25 MCG (1000 UT) tablet Take 1,000 Units by mouth 2 (two) times daily.  . clopidogrel (PLAVIX) 75 MG tablet Take 75 mg by mouth daily.  . cyanocobalamin 1000 MCG tablet Take 1,000 mcg by mouth daily at 12 noon.   . diphenhydrAMINE (BENADRYL) 12.5 MG/5ML elixir Take 12.5 mg by mouth every 6 (six) hours as needed (allergic reaction).  Marland Kitchen EPINEPHrine 0.3 mg/0.3 mL IJ SOAJ injection Inject 0.3 mg into the muscle once.   . escitalopram (LEXAPRO) 20 MG tablet TAKE 1 TABLET BY MOUTH ONCE DAILY. (Patient taking differently: Take 20 mg by mouth at bedtime. )  . Fe Fum-FePoly-Vit C-Vit B3 (INTEGRA) 62.5-62.5-40-3 MG CAPS TAKE (1) CAPSULE BY MOUTH ONCE DAILY. (Patient taking differently: Take 1 tablet by mouth 2 (two) times a week. )  . Melatonin-Pyridoxine 3-10 MG TABS Take 1 tablet by mouth at bedtime.  . mesalamine (LIALDA) 1.2 g EC tablet Take 1.2 g by mouth 3 (three) times daily with meals.   . mupirocin ointment (BACTROBAN) 2 % Apply to affected area bid (Patient taking differently: Apply 1 application topically 2 (two) times daily as needed (rash in head). )  . nitroGLYCERIN (NITROSTAT) 0.4 MG SL tablet DISSOLVE (1) TABLET UNDER TONGUE AS NEEDED TO RELIEVE CHEST PAIN. MAYREPEAT EVERY 5 MINUTES. (Patient taking differently: Place 0.4 mg under the tongue every 5 (five) minutes as needed for chest pain. Usual dose is q 5 minutes x 3 doses.)  . ondansetron (ZOFRAN-ODT) 4 MG disintegrating tablet Take  4 mg by mouth daily as needed for nausea.   . pantoprazole (PROTONIX) 20 MG tablet Take 20 mg by mouth daily.   . prednisoLONE acetate (PRED FORTE) 1 % ophthalmic suspension Place 1 drop into both eyes 2 (two) times daily as needed (for inflammation).  . rizatriptan (MAXALT) 5 MG tablet USE AS DIRECTED  . rosuvastatin (CRESTOR) 5 MG tablet Take 1-2 tablets q day.  . valACYclovir (VALTREX) 1000 MG tablet Take 1 tablet (1,000 mg total)  by mouth 3 (three) times daily. (Patient not taking: Reported on 10/07/2020)  . verapamil (CALAN-SR) 120 MG CR tablet Take 120 mg by mouth every evening.   . [DISCONTINUED] rosuvastatin (CRESTOR) 5 MG tablet Take 10 mg by mouth 2 days per week and take 5 mg by mouth all other days.   No facility-administered encounter medications on file as of 11/19/2020.    Review of Systems  Constitutional: Negative for appetite change and unexpected weight change.  HENT: Negative for congestion and sinus pressure.   Respiratory: Negative for cough, chest tightness and shortness of breath.   Cardiovascular: Negative for chest pain, palpitations and leg swelling.  Gastrointestinal: Negative for abdominal pain, diarrhea, nausea and vomiting.  Genitourinary: Negative for difficulty urinating and dysuria.  Musculoskeletal: Negative for joint swelling and myalgias.       Low back pain as outlined.  PT helping.   Neurological: Negative for dizziness, light-headedness and headaches.  Psychiatric/Behavioral: Negative for agitation and dysphoric mood.       Objective:    Physical Exam Vitals reviewed.  Constitutional:      General: She is not in acute distress.    Appearance: Normal appearance.  HENT:     Head: Normocephalic and atraumatic.     Right Ear: External ear normal.     Left Ear: External ear normal.     Mouth/Throat:     Mouth: Oropharynx is clear and moist.  Eyes:     General: No scleral icterus.       Right eye: No discharge.        Left eye: No discharge.      Conjunctiva/sclera: Conjunctivae normal.  Neck:     Thyroid: No thyromegaly.  Cardiovascular:     Rate and Rhythm: Normal rate and regular rhythm.  Pulmonary:     Effort: No respiratory distress.     Breath sounds: Normal breath sounds. No wheezing.  Abdominal:     General: Bowel sounds are normal.     Palpations: Abdomen is soft.     Tenderness: There is no abdominal tenderness.  Musculoskeletal:        General: No swelling, tenderness or edema.     Cervical back: Neck supple. No tenderness.  Lymphadenopathy:     Cervical: No cervical adenopathy.  Skin:    Findings: No erythema or rash.  Neurological:     Mental Status: She is alert.  Psychiatric:        Mood and Affect: Mood normal.        Behavior: Behavior normal.     BP 122/70   Pulse 72   Temp 98 F (36.7 C) (Oral)   Resp 16   Ht 5' 1"  (1.549 m)   Wt 166 lb (75.3 kg)   SpO2 98%   BMI 31.37 kg/m  Wt Readings from Last 3 Encounters:  11/19/20 166 lb (75.3 kg)  10/07/20 168 lb (76.2 kg)  07/18/20 171 lb 12.8 oz (77.9 kg)     Lab Results  Component Value Date   WBC 8.4 11/14/2020   HGB 13.5 11/14/2020   HCT 39.8 11/14/2020   PLT 341.0 11/14/2020   GLUCOSE 95 11/14/2020   CHOL 172 11/14/2020   TRIG 146.0 11/14/2020   HDL 40.80 11/14/2020   LDLCALC 102 (H) 11/14/2020   ALT 14 11/14/2020   AST 15 11/14/2020   NA 140 11/14/2020   K 4.1 11/14/2020   CL 104 11/14/2020   CREATININE 0.83 11/14/2020   BUN 13  11/14/2020   CO2 29 11/14/2020   TSH 2.40 11/14/2020   INR 1.0 12/23/2017    DG Bone Density  Result Date: 09/17/2020 EXAM: DUAL X-RAY ABSORPTIOMETRY (DXA) FOR BONE MINERAL DENSITY IMPRESSION: Your patient Morgan Moore completed a BMD test on 09/17/2020 using the [place] (software version: 14.10) manufactured by [employer]. The following summarizes the results of our evaluation. Technologist:VLM PATIENT BIOGRAPHICAL: Name: Morgan Moore, Morgan Moore Patient ID: [id number]  Birth Date: Apr 24, 1956 Height: 61.5 in. Gender: Female Exam Date: 09/17/2020 Weight: 171.8 lbs. Indications: Caucasian, Height Loss, Postmenopausal Fractures: Treatments: Vitamin D DENSITOMETRY RESULTS: Site      Region    Measured Date Measured Age WHO Classification Young Adult T-score BMD         %Change vs. Previous Significant Change (*) AP Spine L1-L4 09/17/2020 65.5 Osteopenia -1.3 1.028 g/cm2 - - DualFemur Neck Left 09/17/2020 65.5 Normal -1.0 0.899 g/cm2 - - ASSESSMENT: The BMD measured at AP Spine L1-L4 is 1.028 g/cm2 with a T-score of -1.3. This patient is considered osteopenic according to [place] [facility]) criteria. The scan quality is good. [facility]) criteria for post-menopausal, Caucasian Women: Normal:                   T-score at or above -1 SD Osteopenia/low bone mass: T-score between -1 and -2.5 SD Osteoporosis:             T-score at or below -2.5 SD RECOMMENDATIONS: 1. All patients should optimize calcium and vitamin D intake. 2. Consider FDA-approved medical therapies in postmenopausal women and men aged 57 years and older, based on the following: a. A hip or vertebral(clinical or morphometric) fracture b. T-score < -2.5 at the femoral neck or spine after appropriate evaluation to exclude secondary causes c. Low bone mass (T-score between -1.0 and -2.5 at the femoral neck or spine) and a 10-year probability of a hip fracture > 3% or a 10-year probability of a major osteoporosis-related fracture > 20% based on the [place]-adapted WHO algorithm 3. Clinician judgment and/or patient preferences may indicate treatment for people with 10-year fracture probabilities above or below these levels FOLLOW-UP: People with diagnosed cases of osteoporosis or at high risk for fracture should have regular bone mineral density tests. For patients eligible for Medicare, routine testing is allowed once every 2 years. The testing frequency can be increased to one year for patients who  have rapidly progressing disease, those who are receiving or discontinuing medical therapy to restore bone mass, or have additional risk factors. I have reviewed this report, and agree with the above findings. [facility] Dear Einar Pheasant, Your patient Morgan Moore completed a FRAX assessment on 09/17/2020 using the [place] (analysis version: 14.10) manufactured by [employer]. The following summarizes the results of our evaluation. PATIENT BIOGRAPHICAL: Name: Morgan Moore, Morgan Moore Patient ID: [id number] Birth Date: 1956-08-24 Height:    61.5 in. Gender:     Female    [age]       Weight:    171.8 lbs. Ethnicity:  White                            Exam Date: 09/17/2020 FRAX* RESULTS:  (version: 3.5) 10-year Probability of Fracture1 Major Osteoporotic Fracture2 Hip Fracture 7.7% 0.6% Population: [place] (Caucasian) Risk Factors: None Based on Femur (Left) Neck BMD 1 -The  10-year probability of fracture may be lower than reported if the patient has received treatment. 2 -Major Osteoporotic Fracture: Clinical Spine, Forearm, Hip or Shoulder *FRAX is a Materials engineer of the [employer] for Metabolic Bone Disease, a [place] (WHO) [facility]. ASSESSMENT: The probability of a major osteoporotic fracture is 7.7% within the next ten years. The probability of a hip fracture is 0.6% within the next ten years. . Electronically Signed   By: Rolm Baptise M.D.   On: 09/17/2020 16:12       Assessment & Plan:   Problem List Items Addressed This Visit    Anemia    Follow hgb and iron studies.        Relevant Orders   CBC with Differential/Platelet   IBC + Ferritin   Back pain    PT has helped.  Follow.        CAD (coronary artery disease)    Continue crestor and aspirin.  Continue risk factor modification.  Followed by cardiology.  Currently without symptoms.        Relevant Medications   rosuvastatin (CRESTOR) 5 MG  tablet   Carotid artery stenosis    Continue crestor and aspirin/plavix.  Saw [facility]. Ultrasound as outlined.  Recommended f/u in 6 months (10/07/20)      Relevant Medications   rosuvastatin (CRESTOR) 5 MG tablet   Crohn disease ([place])    Followed by GI.  Bowels stable.  Due colonoscopy 2023.       Essential hypertension    Blood pressure doing well on calan and coreg.  Continue current medication regimen.  Follow pressures.  Follow metabolic panel.        Relevant Medications   rosuvastatin (CRESTOR) 5 MG tablet   Other Relevant Orders   Basic metabolic panel   GERD (gastroesophageal reflux disease)    On protonix.  No upper symptoms reported.        Hypercholesterolemia    On crestor. Discussed adjusting dose.  Low cholesterol diet and exercise.  Cholesterol not at goal.  Follow lipid panel.       Relevant Medications   rosuvastatin (CRESTOR) 5 MG tablet   Other Relevant Orders   Hepatic function panel   Lipid panel   Mild depression ([place])    On lexapro.  Has good support.  Appears to be doing well.       Stress    Overall appears to be handling things relatively well.  Follow       Thickened endometrium    Saw Dr Leafy Ro.  W/up unrevealing.  Note reviewed.  No further w/up warranted.       Toenail fungus    penlac as directed.  Follow.        Relevant Medications   ciclopirox (PENLAC) 8 % solution   Vitamin D deficiency    Continue vitamin D Supplements.           Einar Pheasant, MD

## 2020-11-24 ENCOUNTER — Encounter: Payer: Self-pay | Admitting: Internal Medicine

## 2020-11-24 DIAGNOSIS — M549 Dorsalgia, unspecified: Secondary | ICD-10-CM | POA: Insufficient documentation

## 2020-11-24 DIAGNOSIS — B351 Tinea unguium: Secondary | ICD-10-CM | POA: Insufficient documentation

## 2020-11-24 NOTE — Assessment & Plan Note (Signed)
Overall appears to be handling things relatively well.  Follow

## 2020-11-24 NOTE — Assessment & Plan Note (Signed)
On protonix.  No upper symptoms reported.

## 2020-11-24 NOTE — Assessment & Plan Note (Signed)
Follow hgb and iron studies.

## 2020-11-24 NOTE — Assessment & Plan Note (Signed)
Saw Dr Leafy Ro.  W/up unrevealing.  Note reviewed.  No further w/up warranted.

## 2020-11-24 NOTE — Assessment & Plan Note (Signed)
Followed by GI.  Bowels stable.  Due colonoscopy 2023.

## 2020-11-24 NOTE — Assessment & Plan Note (Signed)
On crestor. Discussed adjusting dose.  Low cholesterol diet and exercise.  Cholesterol not at goal.  Follow lipid panel.

## 2020-11-24 NOTE — Assessment & Plan Note (Signed)
PT has helped.  Follow.

## 2020-11-24 NOTE — Assessment & Plan Note (Signed)
penlac as directed.  Follow.

## 2020-11-24 NOTE — Assessment & Plan Note (Signed)
On lexapro.  Has good support.  Appears to be doing well.

## 2020-11-24 NOTE — Assessment & Plan Note (Signed)
Blood pressure doing well on calan and coreg.  Continue current medication regimen.  Follow pressures.  Follow metabolic panel.

## 2020-11-24 NOTE — Assessment & Plan Note (Signed)
Continue vitamin D Supplements.

## 2020-11-24 NOTE — Assessment & Plan Note (Signed)
Continue crestor and aspirin/plavix.  Saw [facility]. Ultrasound as outlined.  Recommended f/u in 6 months (10/07/20)

## 2020-11-24 NOTE — Assessment & Plan Note (Signed)
Continue crestor and aspirin.  Continue risk factor modification.  Followed by cardiology.  Currently without symptoms.

## 2020-11-28 ENCOUNTER — Telehealth: Payer: Self-pay | Admitting: Internal Medicine

## 2020-11-28 NOTE — Telephone Encounter (Signed)
Given a change in symptoms, please see if she is agreeable for virtual visit to discuss.

## 2020-11-28 NOTE — Telephone Encounter (Signed)
Please call and confirm with pt no acute symptoms.  If persistent pain, can schedule scan, but I want to make sure nothing changed and no acute symptoms.

## 2020-11-28 NOTE — Telephone Encounter (Signed)
Spoken to [patient, she saw PCP two weeks ago. She stated she is having sharp lower right abdominal pain, she stated she refused scan then. She stated she feels like this is a kidney stone and feels very similar to the past. Patient stated she doesn't want to go to ED/UC. Patient would like Flomax for her sx and a scan in case. Sx haven't changed too much, she hurt worse after PCP was pressing during visit then went back to normal.

## 2020-11-28 NOTE — Telephone Encounter (Signed)
Spoke with pt and she has been scheduled for a virtual tomorrow morning with Dr. Nicki Reaper.

## 2020-11-28 NOTE — Telephone Encounter (Signed)
Patient saw Dr. Nicki Reaper on 11/19/20. Dr Nicki Reaper wanted her to call if she still had the lower rt adbomen pain . Pain is still there and she thinks she should know find out what it.

## 2020-11-29 ENCOUNTER — Telehealth (INDEPENDENT_AMBULATORY_CARE_PROVIDER_SITE_OTHER): Payer: Medicare PPO | Admitting: Internal Medicine

## 2020-11-29 ENCOUNTER — Other Ambulatory Visit: Payer: Self-pay

## 2020-11-29 ENCOUNTER — Encounter: Payer: Self-pay | Admitting: Internal Medicine

## 2020-11-29 DIAGNOSIS — R1031 Right lower quadrant pain: Secondary | ICD-10-CM | POA: Diagnosis not present

## 2020-11-29 DIAGNOSIS — M545 Low back pain, unspecified: Secondary | ICD-10-CM | POA: Diagnosis not present

## 2020-11-29 DIAGNOSIS — K50919 Crohn's disease, unspecified, with unspecified complications: Secondary | ICD-10-CM

## 2020-11-29 NOTE — Progress Notes (Signed)
Patient ID: Morgan Moore, female   DOB: 08/12/1956, 65 y.o.   MRN: [id number]   Virtual Visit via video Note  This visit type was conducted due to national recommendations for restrictions regarding the COVID-19 pandemic (e.g. social distancing).  This format is felt to be most appropriate for this patient at this time.  All issues noted in this document were discussed and addressed.  No physical exam was performed (except for noted visual exam findings with Video Visits).   I connected with Oswaldo Done by a video enabled telemedicine application or telephone and verified that I am speaking with the correct person using two identifiers. Location patient: home Location provider: home office Persons participating in the virtual visit: patient, provider  The limitations, risks, security and privacy concerns of performing an evaluation and management service by video and the availability of in person appointments have been discussed.  It has also been discussed with the patient that there may be a patient responsible charge related to this service. The patient expressed understanding and agreed to proceed.   Reason for visit: work in appt  HPI: Work in appt with concerns regarding right flank and lower abdominal pain.  Was having some tenderness last visit. Wanted to monitor.  Has a history of Crohn's.  Sees GI.  On lialda.  Her "Crohn's symptoms" have cleared.  Bowels doing well.  States still has the right lower abdominal pain.  Also has now developed pain in her right flank.  No dysuria or hematuria.  No vaginal symptoms. Has a history of kidney stones.  Feels similar to previous flares.  Discussed further evaluation.  Has seen urology previously.  Last stone - 2018.     ROS: See pertinent positives and negatives per HPI.  Past Medical History:  Diagnosis Date  . Allergy   . Anemia   . Anxiety   . Chicken pox   . Complication of anesthesia    vomitting  . Coronary artery  disease   . Crohn disease ([place])   . DVT (deep venous thrombosis) ([place]) 2017  . Dyspnea    on excertion  . Dysrhythmia 2019   palpitations  . GERD (gastroesophageal reflux disease)   . Heart disease   . Heart murmur   . Heart murmur   . History of kidney stones   . Hyperlipidemia   . Hypertension   . Inflammatory bowel disease   . Migraines    hormonal, puberty  . Myocardial infarction ([place])   . Phlebitis   . PONV (postoperative nausea and vomiting)     Past Surgical History:  Procedure Laterality Date  . BREAST BIOPSY Left 07/05/2019   FIBROEPITHELIAL lesion/neg  . BREAST CYST ASPIRATION     unsure of side  . CARPAL TUNNEL RELEASE Right 12/02/2017   Procedure: CARPAL TUNNEL RELEASE ENDOSCOPIC;  Surgeon: Corky Mull, MD;  Location: [facility] ORS;  Service: Orthopedics;  Laterality: Right;  . CORONARY ANGIOPLASTY WITH STENT PLACEMENT  2017   x 2  . EYE SURGERY Bilateral    cataract surgery  . GANGLION CYST EXCISION Right 12/02/2017   Procedure: REMOVAL GANGLION OF WRIST;  Surgeon: Corky Mull, MD;  Location: [facility] ORS;  Service: Orthopedics;  Laterality: Right;  . heart murmur    . HYSTEROSCOPY WITH D & C N/A 04/19/2020   Procedure: DILATATION AND CURETTAGE /HYSTEROSCOPY, POSSIBLE  POLYPECTOMY;  Surgeon: Benjaman Kindler, MD;  Location: [facility] ORS;  Service: Gynecology;  Laterality: N/A;  . TONSILLECTOMY AND ADENOIDECTOMY  [place]  . tubaligation  1990    Family History  Problem Relation Age of Onset  . Heart disease Father   . Heart disease Brother   . Cancer Maternal Aunt   . Breast cancer Maternal Aunt   . Stroke Mother   . Kidney disease Neg Hx   . GU problems Neg Hx   . Kidney cancer Neg Hx     SOCIAL HX: reviewed.    Current Outpatient Medications:  .  aspirin EC 81 MG tablet, Take 81 mg by mouth daily., Disp: , Rfl:  .  carvedilol (COREG) 3.125 MG tablet, Take 1.5625 mg by mouth 2 (two) times daily with a meal. , Disp: , Rfl:  .   Cholecalciferol 25 MCG (1000 UT) tablet, Take 1,000 Units by mouth 2 (two) times daily., Disp: , Rfl:  .  ciclopirox (PENLAC) 8 % solution, Apply topically at bedtime. Apply over nail and surrounding skin. Apply daily over previous coat. After seven (7) days, may remove with alcohol and continue cycle., Disp: 6.6 mL, Rfl: 1 .  clopidogrel (PLAVIX) 75 MG tablet, Take 75 mg by mouth daily., Disp: , Rfl:  .  cyanocobalamin 1000 MCG tablet, Take 1,000 mcg by mouth daily at 12 noon. , Disp: , Rfl:  .  diphenhydrAMINE (BENADRYL) 12.5 MG/5ML elixir, Take 12.5 mg by mouth every 6 (six) hours as needed (allergic reaction)., Disp: , Rfl:  .  EPINEPHrine 0.3 mg/0.3 mL IJ SOAJ injection, Inject 0.3 mg into the muscle once. , Disp: , Rfl:  .  escitalopram (LEXAPRO) 20 MG tablet, TAKE 1 TABLET BY MOUTH ONCE DAILY. (Patient taking differently: Take 20 mg by mouth at bedtime.), Disp: 90 tablet, Rfl: 0 .  Fe Fum-FePoly-Vit C-Vit B3 (INTEGRA) 62.5-62.5-40-3 MG CAPS, TAKE (1) CAPSULE BY MOUTH ONCE DAILY. (Patient taking differently: Take 1 tablet by mouth 2 (two) times a week.), Disp: 90 capsule, Rfl: 0 .  Melatonin-Pyridoxine 3-10 MG TABS, Take 1 tablet by mouth at bedtime., Disp: , Rfl:  .  mesalamine (LIALDA) 1.2 g EC tablet, Take 1.2 g by mouth 3 (three) times daily with meals. , Disp: , Rfl:  .  mupirocin ointment (BACTROBAN) 2 %, Apply to affected area bid (Patient taking differently: Apply 1 application topically 2 (two) times daily as needed (rash in head).), Disp: 22 g, Rfl: 0 .  nitroGLYCERIN (NITROSTAT) 0.4 MG SL tablet, DISSOLVE (1) TABLET UNDER TONGUE AS NEEDED TO RELIEVE CHEST PAIN. MAYREPEAT EVERY 5 MINUTES. (Patient taking differently: Place 0.4 mg under the tongue every 5 (five) minutes as needed for chest pain. Usual dose is q 5 minutes x 3 doses.), Disp: 25 tablet, Rfl: 0 .  ondansetron (ZOFRAN-ODT) 4 MG disintegrating tablet, Take 4 mg by mouth daily as needed for nausea. , Disp: , Rfl:  .   prednisoLONE acetate (PRED FORTE) 1 % ophthalmic suspension, Place 1 drop into both eyes 2 (two) times daily as needed (for inflammation)., Disp: , Rfl:  .  rizatriptan (MAXALT) 5 MG tablet, USE AS DIRECTED, Disp: 10 tablet, Rfl: 0 .  rosuvastatin (CRESTOR) 5 MG tablet, Take 1-2 tablets q day., Disp: 60 tablet, Rfl: 4 .  valACYclovir (VALTREX) 1000 MG tablet, Take 1 tablet (1,000 mg total) by mouth 3 (three) times daily., Disp: 21 tablet, Rfl: 0 .  verapamil (CALAN-SR) 120 MG CR tablet, Take 120 mg by mouth every evening. , Disp: , Rfl:  .  pantoprazole (PROTONIX) 20 MG tablet, Take 20  mg by mouth daily. , Disp: , Rfl:   EXAM:  GENERAL: alert, oriented, appears well and in no acute distress  HEENT: atraumatic, conjunttiva clear, no obvious abnormalities on inspection of external nose and ears  NECK: normal movements of the head and neck  LUNGS: on inspection no signs of respiratory distress, breathing rate appears normal, no obvious gross SOB, gasping or wheezing  CV: no obvious cyanosis  PSYCH/NEURO: pleasant and cooperative, no obvious depression or anxiety, speech and thought processing grossly intact  ASSESSMENT AND PLAN:  Discussed the following assessment and plan:  Problem List Items Addressed This Visit    Abdominal pain    Right lower quadrant pain and right flank pain.  Has a history of Crohn's.  Currently feels bowels are stable.  Pain feels similar to previous pain with kidney stones.  Persistent/worsening pain.  Check CT - renal stone.  Discussed possible flomax. Further w/up pending resutls.  She is eating.  No nausea or vomiting.  Follow.       Relevant Orders   CT RENAL STONE STUDY   Back pain    Previous low back pain.  PT helped.  Now with right flank pain and right lower abdominal pain that feels c/w pain she has felt with previous kidney stones. Check CT - renal stone CT as outlined.  Follow.       Crohn disease ([place])    On lialda.  Bowels doing well.  No  acute symptoms currently.  Follow.           I discussed the assessment and treatment plan with the patient. The patient was provided an opportunity to ask questions and all were answered. The patient agreed with the plan and demonstrated an understanding of the instructions.   The patient was advised to call back or seek an in-person evaluation if the symptoms worsen or if the condition fails to improve as anticipated.    Einar Pheasant, MD

## 2020-11-30 ENCOUNTER — Encounter: Payer: Self-pay | Admitting: Internal Medicine

## 2020-11-30 DIAGNOSIS — R109 Unspecified abdominal pain: Secondary | ICD-10-CM | POA: Insufficient documentation

## 2020-11-30 NOTE — Assessment & Plan Note (Signed)
Previous low back pain.  PT helped.  Now with right flank pain and right lower abdominal pain that feels c/w pain she has felt with previous kidney stones. Check CT - renal stone CT as outlined.  Follow.

## 2020-11-30 NOTE — Assessment & Plan Note (Signed)
On lialda.  Bowels doing well.  No acute symptoms currently.  Follow.

## 2020-11-30 NOTE — Assessment & Plan Note (Signed)
Right lower quadrant pain and right flank pain.  Has a history of Crohn's.  Currently feels bowels are stable.  Pain feels similar to previous pain with kidney stones.  Persistent/worsening pain.  Check CT - renal stone.  Discussed possible flomax. Further w/up pending resutls.  She is eating.  No nausea or vomiting.  Follow.

## 2020-12-03 ENCOUNTER — Ambulatory Visit
Admission: RE | Admit: 2020-12-03 | Discharge: 2020-12-03 | Disposition: A | Discharge status: Home / Self Care | Payer: Medicare PPO | Source: Ambulatory Visit | Attending: Internal Medicine | Admitting: Internal Medicine

## 2020-12-03 ENCOUNTER — Other Ambulatory Visit: Payer: Self-pay

## 2020-12-03 DIAGNOSIS — R1031 Right lower quadrant pain: Secondary | ICD-10-CM | POA: Insufficient documentation

## 2020-12-03 DIAGNOSIS — N200 Calculus of kidney: Secondary | ICD-10-CM | POA: Diagnosis not present

## 2020-12-04 ENCOUNTER — Telehealth: Payer: Self-pay | Admitting: Internal Medicine

## 2020-12-04 NOTE — Telephone Encounter (Signed)
Pt called to get [place] results

## 2020-12-04 NOTE — Telephone Encounter (Signed)
See result note.  

## 2020-12-05 ENCOUNTER — Other Ambulatory Visit: Payer: Self-pay | Admitting: Internal Medicine

## 2020-12-05 DIAGNOSIS — N200 Calculus of kidney: Secondary | ICD-10-CM

## 2020-12-05 NOTE — Progress Notes (Signed)
Order placed for urology referral.

## 2020-12-09 ENCOUNTER — Other Ambulatory Visit: Payer: Self-pay | Admitting: Internal Medicine

## 2020-12-17 ENCOUNTER — Ambulatory Visit (INDEPENDENT_AMBULATORY_CARE_PROVIDER_SITE_OTHER): Payer: Medicare PPO | Admitting: Urology

## 2020-12-17 ENCOUNTER — Other Ambulatory Visit: Payer: Self-pay

## 2020-12-17 VITALS — BP 121/75 | HR 66 | Ht 60.0 in | Wt 164.0 lb

## 2020-12-17 DIAGNOSIS — N200 Calculus of kidney: Secondary | ICD-10-CM

## 2020-12-17 NOTE — Progress Notes (Signed)
12/17/2020 11:02 AM   Morgan Moore 28-Jun-1956 [id number]  Referring provider: Einar Pheasant, [address] [place],  [place] [address]  No chief complaint on file.   HPI: 65 year old female referred for further evaluation of kidney stones.  She has a personal history of Crohn's disease.  She underwent a CT stone protocol 12/03/2020 that showed a punctate left renal stone without right-sided stones.  That she had no obstructing ureteral calculi.  No other GU pathology was identified.  She was last seen by urology in 2018 by Dr. Pilar Jarvis.  At that time, she had a 5 mm stone which she subsequently passed.  Stone composition at the time was primarily calcium oxalate monohydrate.  She also have a 24-hour urine around the same time which showed very low urine volume of 1.53.  It appears that she never returned to discuss these results.  She reports when she had a CT scan, she is primarily having some discomfort in her left lower quadrant, now on the right.  She denies any stone episodes since the aforementioned.  No urinary symptoms including no dysuria or gross hematuria.  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  She does not need a high oxalate diet.  Her Crohn's is been fairly well controlled.  She mentions today that she has had a lot of stressors over the past several years.  She tragically lost her daughter.  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  She is a retired Pharmacist, hospital.   PMH: Past Medical History:  Diagnosis Date   Allergy    Anemia    Anxiety    Chicken pox    Complication of anesthesia    vomitting   Coronary artery disease    Crohn disease ([place])    DVT (deep venous thrombosis) ([place]) 2017   Dyspnea    on excertion   Dysrhythmia 2019   palpitations   GERD (gastroesophageal reflux disease)     Heart disease    Heart murmur    Heart murmur    History of kidney stones    Hyperlipidemia    Hypertension    Inflammatory bowel disease    Migraines    hormonal, puberty   Myocardial infarction ([place])    Phlebitis    PONV (postoperative nausea and vomiting)     Surgical History: Past Surgical History:  Procedure Laterality Date   BREAST BIOPSY Left 07/05/2019   FIBROEPITHELIAL lesion/neg   BREAST CYST ASPIRATION     unsure of side   CARPAL TUNNEL RELEASE Right 12/02/2017   Procedure: CARPAL TUNNEL RELEASE ENDOSCOPIC;  Surgeon: Corky Mull, MD;  Location: [facility] ORS;  Service: Orthopedics;  Laterality: Right;   CORONARY ANGIOPLASTY WITH STENT PLACEMENT  2017   x 2   EYE SURGERY Bilateral    cataract surgery   GANGLION CYST EXCISION Right 12/02/2017   Procedure: REMOVAL GANGLION OF WRIST;  Surgeon: Corky Mull, MD;  Location: [facility] OR[facility];  Service: Orthopedics;  Laterality: Right;   heart murmur     HYSTEROSCOPY WITH D & C N/A 04/19/2020   Procedure: DILATATION AND CURETTAGE /HYSTEROSCOPY, POSSIBLE  POLYPECTOMY;  Surgeon: Benjaman Kindler, MD;  Location: [facility] ORS;  Service: Gynecology;  Laterality: N/A;   [place]   tubaligation  1990    Home Medications:  Allergies as of 12/17/2020      Reactions   Ciprofloxacin Anaphylaxis   Keflex [cephalexin] Anaphylaxis   Lisinopril Anaphylaxis   Meclizine Anaphylaxis   Monurol [fosfomycin Tromethamine  (obsolete)] Rash   Splotchy rash all over body. Breathing became difficult/tightened. Took benadryl immediately so she did not swell    Sulfa Antibiotics Anaphylaxis   Tessalon [benzonatate] Anaphylaxis   Influenza Virus Vaccine Other (See Comments)   History of Epstein Barr   Codeine Nausea And Vomiting      Medication List       Accurate as of December 17, 2020 11:59 PM. If you have any questions, ask your nurse or doctor.        STOP taking these  medications   valACYclovir 1000 MG tablet Commonly known as: VALTREX Stopped by: Hollice Espy, MD     TAKE these medications   aspirin EC 81 MG tablet Take 81 mg by mouth daily.   carvedilol 3.125 MG tablet Commonly known as: COREG Take 1.5625 mg by mouth 2 (two) times daily with a meal.   Cholecalciferol 25 MCG (1000 UT) tablet Take 1,000 Units by mouth 2 (two) times daily.   ciclopirox 8 % solution Commonly known as: Penlac Apply topically at bedtime. Apply over nail and surrounding skin. Apply daily over previous coat. After seven (7) days, may remove with alcohol and continue cycle.   clopidogrel 75 MG tablet Commonly known as: PLAVIX Take 75 mg by mouth daily.   cyanocobalamin 1000 MCG tablet Take 1,000 mcg by mouth daily at 12 noon.   diphenhydrAMINE 12.5 MG/5ML elixir Commonly known as: BENADRYL Take 12.5 mg by mouth every 6 (six) hours as needed (allergic reaction).   EPINEPHrine 0.3 mg/0.3 mL Soaj injection Commonly known as: EPI-PEN Inject 0.3 mg into the muscle once.   escitalopram 20 MG tablet Commonly known as: LEXAPRO TAKE 1 TABLET BY MOUTH ONCE DAILY. What changed: when to take this   Integra 62.5-62.5-40-3 MG Caps TAKE (1) CAPSULE BY MOUTH ONCE DAILY. What changed: See the new instructions.   Melatonin-Pyridoxine 3-10 MG Tabs Take 1 tablet by mouth at bedtime.   mesalamine 1.2 g EC tablet Commonly known as: LIALDA Take 1.2 g by mouth 3 (three) times daily with meals.   mupirocin ointment 2 % Commonly known as: Bactroban Apply to affected area bid What changed:   how much to take  how to take this  when to take this  reasons to take this  additional instructions   nitroGLYCERIN 0.4 MG SL tablet Commonly known as: NITROSTAT DISSOLVE (1) TABLET UNDER TONGUE AS NEEDED TO RELIEVE CHEST PAIN. MAYREPEAT EVERY 5 MINUTES.   ondansetron 4 MG disintegrating tablet Commonly known as: ZOFRAN-ODT Take 4 mg by mouth daily as needed for  nausea.   pantoprazole 20 MG tablet Commonly known as: PROTONIX Take 20 mg by mouth daily.   prednisoLONE acetate 1 % ophthalmic suspension Commonly known as: PRED FORTE Place 1 drop into both eyes 2 (two) times daily as needed (for inflammation).   rizatriptan 5 MG tablet Commonly known as: MAXALT USE AS DIRECTED   rosuvastatin 5 MG tablet Commonly known as: CRESTOR Take 1-2 tablets q day.   verapamil 120 MG CR tablet Commonly known as: CALAN-SR Take 120 mg by mouth every evening.       Allergies:  Allergies  Allergen Reactions   Ciprofloxacin Anaphylaxis   Keflex [Cephalexin] Anaphylaxis   Lisinopril Anaphylaxis   Meclizine  Anaphylaxis   Monurol [Fosfomycin Tromethamine  (Obsolete)] Rash    Splotchy rash all over body. Breathing became difficult/tightened. Took benadryl immediately so she did not swell    Sulfa Antibiotics Anaphylaxis   Tessalon [Benzonatate] Anaphylaxis   Influenza Virus Vaccine Other (See Comments)    History of Epstein Barr   Codeine Nausea And Vomiting    Family History: Family History  Problem Relation Age of Onset   Heart disease Father    Heart disease Brother    Cancer Maternal Aunt    Breast cancer Maternal Aunt    Stroke Mother    Kidney disease Neg Hx    GU problems Neg Hx    Kidney cancer Neg Hx     Social History:  reports that she has never smoked. She has never used smokeless tobacco. She reports that she does not drink alcohol and does not use drugs.   Physical Exam: BP 121/75    Pulse 66    Ht 5' (1.524 m)    Wt 164 lb (74.4 kg)    BMI 32.03 kg/m   Constitutional:  Alert and oriented, No acute distress. HEENT: [place] AT, moist mucus membranes.  Trachea midline, no masses. Cardiovascular: No clubbing, cyanosis, or edema. Respiratory: Normal respiratory effort, no increased work of breathing. Skin: No rashes, bruises or suspicious lesions. Neurologic: Grossly intact, no focal deficits, moving all 4  extremities. Psychiatric: Normal mood and affect.  Laboratory Data: Lab Results  Component Value Date   WBC 8.4 11/14/2020   HGB 13.5 11/14/2020   HCT 39.8 11/14/2020   MCV 93.0 11/14/2020   PLT 341.0 11/14/2020    Lab Results  Component Value Date   CREATININE 0.83 11/14/2020   Urinalysis Results for orders placed or performed in visit on 12/17/20  Microscopic Examination   Urine  Result Value Ref Range   WBC, UA 11-30 (A) 0 - 5 /hpf   RBC 0-2 0 - 2 /hpf   Epithelial Cells (non renal) >10 (A) 0 - 10 /hpf   Bacteria, UA Few None seen/Few  Urinalysis, Complete  Result Value Ref Range   Specific Gravity, UA 1.025 1.005 - 1.030   pH, UA 5.5 5.0 - 7.5   Color, UA Yellow Yellow   Appearance Ur Cloudy (A) Clear   Leukocytes,UA 1+ (A) Negative   Protein,UA Negative Negative/Trace   Glucose, UA Negative Negative   Ketones, UA Negative Negative   RBC, UA Negative Negative   Bilirubin, UA Negative Negative   Urobilinogen, Ur 0.2 0.2 - 1.0 mg/dL   Nitrite, UA Negative Negative   Microscopic Examination See below:     Pertinent Imaging: \ CT RENAL STONE STUDY  Narrative CLINICAL DATA:  Flank pain, kidney stone suspected. History of kidney stones, right pelvic pain and right back pain.  EXAM: CT ABDOMEN AND PELVIS WITHOUT CONTRAST  TECHNIQUE: Multidetector CT imaging of the abdomen and pelvis was performed following the standard protocol without IV contrast.  COMPARISON:  CT abdomen pelvis Mar 28, 2017  FINDINGS: Lower chest: No acute abnormality.  Hepatobiliary: Decreased size of the low-density lesion seen along the posterior aspect of the left lobe of the liver which now measures 2.5 cm previously 4.0 cm (series 2, image 15). Also decreased in size is the hypoattenuating lesion seen within the left hepatic parenchyma which now measures 2.1 cm previously 2.9 cm (series 2 image 15). Calcified splenic granuloma left lobe of the liver. Gallbladder is  unremarkable. No biliary ductal dilatation.  Pancreas:  Unremarkable. No pancreatic ductal dilatation or surrounding inflammatory changes.  Spleen: Normal in size without focal abnormality.  Adrenals/Urinary Tract: Adrenal glands are unremarkable. Punctate LEFT renal calculi. No RIGHT renal calculi. No focal lesion, or hydronephrosis. Bladder is unremarkable.  Stomach/Bowel: Radiopaque tablets in the stomach. No suspicious gastric wall thickening. Appendix appears normal. Scattered sigmoid diverticula. No evidence of bowel wall thickening, distention, or inflammatory changes.  Vascular/Lymphatic: Aortic atherosclerosis. No enlarged abdominal or pelvic lymph nodes.  Reproductive: Uterus and bilateral adnexa are unremarkable.  Other: No abdominopelvic ascites.  Musculoskeletal: Mild degenerative change of the lumbar spine. No suspicious lytic or blastic lesion of bone. No acute osseous abnormality.  IMPRESSION: 1. Punctate nonobstructing LEFT renal calculi. No RIGHT renal calculi. No hydronephrosis. 2. Decreased size of the low-density lesions within the left lobe of the liver, which are incompletely evaluated in the absence of intravenous contrast, but likely represent a benign etiology such as focal fatty infiltration. However, a more complete assessment of these lesions could be obtained with liver protocol MRI or CT with and without contrast. 3. Scattered sigmoid diverticula without evidence of acute diverticulitis. 4. Aortic atherosclerosis.  Aortic Atherosclerosis (ICD10-I70.0).   Electronically Signed By: Dahlia Bailiff MD On: 12/03/2020 11:00  CT scan was personally reviewed today, agree with radiologic interpretation.  Assessment & Plan:    1. Kidney stones Punctate incidental left renal calculus, asymptomatic  Do not recommend any further surveillance or intervention for stone  We discussed general stone prevention techniques including drinking plenty  water with goal of producing 2.5 L urine daily, increased citric acid intake, avoidance of high oxalate containing foods, and decreased salt intake.  Information about dietary recommendations given today.  We did discuss her risk factors was on the regimen history of Crohn's.  She will follow up as needed.  - Urinalysis, Complete    Hollice Espy, MD  [facility] [address] [place], [place] [address] [phone]

## 2020-12-18 ENCOUNTER — Telehealth: Payer: Self-pay

## 2020-12-18 LAB — URINALYSIS, COMPLETE
Bilirubin, UA: NEGATIVE
Glucose, UA: NEGATIVE
Ketones, UA: NEGATIVE
Nitrite, UA: NEGATIVE
Protein,UA: NEGATIVE
RBC, UA: NEGATIVE
Specific Gravity, UA: 1.025 (ref 1.005–1.030)
Urobilinogen, Ur: 0.2 mg/dL (ref 0.2–1.0)
pH, UA: 5.5 (ref 5.0–7.5)

## 2020-12-18 LAB — MICROSCOPIC EXAMINATION: Epithelial Cells (non renal): >10 /hpf — AB (ref 0–10)

## 2020-12-18 NOTE — Telephone Encounter (Signed)
Patient called today stating she would like a printed copy of her CTscan report sent to her as well as the diet information discussed at her visit with Dr. Erlene Quan yesterday. The Low Oxalate diet info and ct report was mailed to the patient

## 2021-02-27 ENCOUNTER — Telehealth: Payer: Self-pay | Admitting: Internal Medicine

## 2021-02-27 NOTE — Telephone Encounter (Signed)
Left message for patient to call back and schedule Medicare Annual Wellness Visit (AWV)   Please schedule on the [facility] template  No hx of previous AWV. please schedule at anytime.  This should be a 45 minute visit!  AWV-I PER [place] AS OF 02/07/21

## 2021-03-04 ENCOUNTER — Other Ambulatory Visit: Payer: Self-pay | Admitting: Internal Medicine

## 2021-03-17 ENCOUNTER — Other Ambulatory Visit: Payer: Self-pay

## 2021-03-17 ENCOUNTER — Other Ambulatory Visit (INDEPENDENT_AMBULATORY_CARE_PROVIDER_SITE_OTHER): Payer: Medicare PPO

## 2021-03-17 DIAGNOSIS — E7800 Pure hypercholesterolemia, unspecified: Secondary | ICD-10-CM

## 2021-03-17 DIAGNOSIS — I10 Essential (primary) hypertension: Secondary | ICD-10-CM

## 2021-03-17 DIAGNOSIS — D509 Iron deficiency anemia, unspecified: Secondary | ICD-10-CM

## 2021-03-17 LAB — LIPID PANEL
Cholesterol: 174 mg/dL (ref 0–200)
HDL: 50.3 mg/dL (ref >39.00)
LDL Cholesterol: 101 mg/dL — ABNORMAL HIGH (ref 0–99)
NonHDL: 124.08
Total CHOL/HDL Ratio: 3
Triglycerides: 117 mg/dL (ref 0.0–149.0)
VLDL: 23.4 mg/dL (ref 0.0–40.0)

## 2021-03-17 LAB — CBC WITH DIFFERENTIAL/PLATELET
Basophils Absolute: 0 10*3/uL (ref 0.0–0.1)
Basophils Relative: 0.6 % (ref 0.0–3.0)
Eosinophils Absolute: 0.2 10*3/uL (ref 0.0–0.7)
Eosinophils Relative: 2.8 % (ref 0.0–5.0)
HCT: 40.3 % (ref 36.0–46.0)
Hemoglobin: 13.8 g/dL (ref 12.0–15.0)
Lymphocytes Relative: 14.5 % (ref 12.0–46.0)
Lymphs Abs: 1.1 10*3/uL (ref 0.7–4.0)
MCHC: 34.4 g/dL (ref 30.0–36.0)
MCV: 94.4 fl (ref 78.0–100.0)
Monocytes Absolute: 0.6 10*3/uL (ref 0.1–1.0)
Monocytes Relative: 7.5 % (ref 3.0–12.0)
Neutro Abs: 5.7 10*3/uL (ref 1.4–7.7)
Neutrophils Relative %: 74.6 % (ref 43.0–77.0)
Platelets: 307 10*3/uL (ref 150.0–400.0)
RBC: 4.27 Mil/uL (ref 3.87–5.11)
RDW: 13.9 % (ref 11.5–15.5)
WBC: 7.6 10*3/uL (ref 4.0–10.5)

## 2021-03-17 LAB — BASIC METABOLIC PANEL
BUN: 17 mg/dL (ref 6–23)
CO2: 27 mEq/L (ref 19–32)
Calcium: 9.5 mg/dL (ref 8.4–10.5)
Chloride: 106 mEq/L (ref 96–112)
Creatinine, Ser: 0.79 mg/dL (ref 0.40–1.20)
GFR: 78.13 mL/min (ref >60.00)
Glucose, Bld: 101 mg/dL — ABNORMAL HIGH (ref 70–99)
Potassium: 4 mEq/L (ref 3.5–5.1)
Sodium: 141 mEq/L (ref 135–145)

## 2021-03-17 LAB — HEPATIC FUNCTION PANEL
ALT: 18 U/L (ref 0–35)
AST: 16 U/L (ref 0–37)
Albumin: 4.4 g/dL (ref 3.5–5.2)
Alkaline Phosphatase: 79 U/L (ref 39–117)
Bilirubin, Direct: 0.1 mg/dL (ref 0.0–0.3)
Total Bilirubin: 0.6 mg/dL (ref 0.2–1.2)
Total Protein: 6.7 g/dL (ref 6.0–8.3)

## 2021-03-17 LAB — IBC + FERRITIN
Ferritin: 53.7 ng/mL (ref 10.0–291.0)
Iron: 105 ug/dL (ref 42–145)
Saturation Ratios: 34.6 % (ref 20.0–50.0)
Transferrin: 217 mg/dL (ref 212.0–360.0)

## 2021-03-18 ENCOUNTER — Other Ambulatory Visit: Payer: Self-pay | Admitting: Internal Medicine

## 2021-03-18 DIAGNOSIS — I25118 Atherosclerotic heart disease of native coronary artery with other forms of angina pectoris: Secondary | ICD-10-CM | POA: Diagnosis not present

## 2021-03-18 DIAGNOSIS — R42 Dizziness and giddiness: Secondary | ICD-10-CM | POA: Diagnosis not present

## 2021-03-18 DIAGNOSIS — I10 Essential (primary) hypertension: Secondary | ICD-10-CM | POA: Diagnosis not present

## 2021-03-18 DIAGNOSIS — R011 Cardiac murmur, unspecified: Secondary | ICD-10-CM | POA: Diagnosis not present

## 2021-03-18 DIAGNOSIS — Z823 Family history of stroke: Secondary | ICD-10-CM | POA: Diagnosis not present

## 2021-03-18 DIAGNOSIS — Z955 Presence of coronary angioplasty implant and graft: Secondary | ICD-10-CM | POA: Diagnosis not present

## 2021-03-18 DIAGNOSIS — R001 Bradycardia, unspecified: Secondary | ICD-10-CM | POA: Diagnosis not present

## 2021-03-18 DIAGNOSIS — I471 Supraventricular tachycardia: Secondary | ICD-10-CM | POA: Diagnosis not present

## 2021-03-18 DIAGNOSIS — R600 Localized edema: Secondary | ICD-10-CM | POA: Diagnosis not present

## 2021-03-19 ENCOUNTER — Other Ambulatory Visit: Payer: Self-pay

## 2021-03-19 ENCOUNTER — Encounter: Payer: Self-pay | Admitting: Internal Medicine

## 2021-03-19 ENCOUNTER — Ambulatory Visit (INDEPENDENT_AMBULATORY_CARE_PROVIDER_SITE_OTHER): Payer: Medicare PPO | Admitting: Internal Medicine

## 2021-03-19 DIAGNOSIS — K50919 Crohn's disease, unspecified, with unspecified complications: Secondary | ICD-10-CM

## 2021-03-19 DIAGNOSIS — R42 Dizziness and giddiness: Secondary | ICD-10-CM | POA: Diagnosis not present

## 2021-03-19 DIAGNOSIS — E7800 Pure hypercholesterolemia, unspecified: Secondary | ICD-10-CM

## 2021-03-19 DIAGNOSIS — F320 Major depressive disorder, single episode, mild: Secondary | ICD-10-CM | POA: Diagnosis not present

## 2021-03-19 DIAGNOSIS — F439 Reaction to severe stress, unspecified: Secondary | ICD-10-CM

## 2021-03-19 DIAGNOSIS — I10 Essential (primary) hypertension: Secondary | ICD-10-CM | POA: Diagnosis not present

## 2021-03-19 DIAGNOSIS — K219 Gastro-esophageal reflux disease without esophagitis: Secondary | ICD-10-CM

## 2021-03-19 DIAGNOSIS — I2510 Atherosclerotic heart disease of native coronary artery without angina pectoris: Secondary | ICD-10-CM | POA: Diagnosis not present

## 2021-03-19 DIAGNOSIS — R238 Other skin changes: Secondary | ICD-10-CM

## 2021-03-19 DIAGNOSIS — R233 Spontaneous ecchymoses: Secondary | ICD-10-CM

## 2021-03-19 DIAGNOSIS — F32A Depression, unspecified: Secondary | ICD-10-CM

## 2021-03-19 DIAGNOSIS — I6529 Occlusion and stenosis of unspecified carotid artery: Secondary | ICD-10-CM

## 2021-03-19 DIAGNOSIS — G43009 Migraine without aura, not intractable, without status migrainosus: Secondary | ICD-10-CM

## 2021-03-19 DIAGNOSIS — D509 Iron deficiency anemia, unspecified: Secondary | ICD-10-CM

## 2021-03-19 NOTE — Progress Notes (Signed)
Patient ID: Morgan Moore, female   DOB: [birth year], 65 y.o.   MRN: [id number]   Subjective:    Patient ID: Morgan Moore, female    DOB: 11/03/1956, 65 y.o.   MRN: [id number]  HPI This visit occurred during the SARS-CoV-2 public health emergency.  Safety protocols were in place, including screening questions prior to the visit, additional usage of staff PPE, and extensive cleaning of exam room while observing appropriate contact time as indicated for disinfecting solutions.  Patient here for a scheduled follow up.  Here to follow up regarding increased stress, elevated blood pressure and cholesterol.  She just saw cardiology this week for f/u appt.  Reported to them some persistent intermittent chest discomfort- sting in her chest. Also reported occasional light headedness.  Had two recent falls - fell over a shovel - working in yard.  Some bruising.  Feels she was not remembering names as well.  No focal neurological symptoms.  No significant headache.  Cardiology scheduled her for CTA.  Recommended continuing aspirin, coreg and crestor.  Did recommend she could stop aspirin.  She was also scheduled for echocardiogram.  Her carotids - ok - followed by vascular surgery.  She stays active.  Volunteers.  Eating.  No nausea or vomiting.  Bowels moving. Remains on lexapro.  Overall stable.  Has good support.    Past Medical History:  Diagnosis Date  . Allergy   . Anemia   . Anxiety   . Chicken pox   . Complication of anesthesia    vomitting  . Coronary artery disease   . Crohn disease ([place])   . DVT (deep venous thrombosis) ([place]) 2017  . Dyspnea    on excertion  . Dysrhythmia 2019   palpitations  . GERD (gastroesophageal reflux disease)   . Heart disease   . Heart murmur   . Heart murmur   . History of kidney stones   . Hyperlipidemia   . Hypertension   . Inflammatory bowel disease   . Migraines    hormonal, puberty  . Myocardial infarction ([place])   . Phlebitis   . PONV  (postoperative nausea and vomiting)    Past Surgical History:  Procedure Laterality Date  . BREAST BIOPSY Left 07/05/2019   FIBROEPITHELIAL lesion/neg  . BREAST CYST ASPIRATION     unsure of side  . CARPAL TUNNEL RELEASE Right 12/02/2017   Procedure: CARPAL TUNNEL RELEASE ENDOSCOPIC;  Surgeon: Corky Mull, MD;  Location: [facility] ORS;  Service: Orthopedics;  Laterality: Right;  . CORONARY ANGIOPLASTY WITH STENT PLACEMENT  2017   x 2  . EYE SURGERY Bilateral    cataract surgery  . GANGLION CYST EXCISION Right 12/02/2017   Procedure: REMOVAL GANGLION OF WRIST;  Surgeon: Corky Mull, MD;  Location: [facility] ORS;  Service: Orthopedics;  Laterality: Right;  . heart murmur    . HYSTEROSCOPY WITH D & C N/A 04/19/2020   Procedure: DILATATION AND CURETTAGE /HYSTEROSCOPY, POSSIBLE  POLYPECTOMY;  Surgeon: Benjaman Kindler, MD;  Location: [facility] ORS;  Service: Gynecology;  Laterality: N/A;  . TONSILLECTOMY AND ADENOIDECTOMY  1962  . TUBAL LIGATION  1993  . tubaligation  1990   Family History  Problem Relation Age of Onset  . Heart disease Father   . Heart disease Brother   . Cancer Maternal Aunt   . Breast cancer Maternal Aunt   . Stroke Mother   . Kidney disease Neg Hx   . GU problems Neg Hx   . Kidney  cancer Neg Hx    Social History   Socioeconomic History  . Marital status: Single    Spouse name: Not on file  . Number of children: Not on file  . Years of education: Not on file  . Highest education level: Not on file  Occupational History  . Not on file  Tobacco Use  . Smoking status: Never Smoker  . Smokeless tobacco: Never Used  Vaping Use  . Vaping Use: Never used  Substance and Sexual Activity  . Alcohol use: No    Alcohol/week: 0.0 standard drinks  . Drug use: No  . Sexual activity: Not on file  Other Topics Concern  . Not on file  Social History Narrative   Had 2 kids. Daughter died in 01-08-2012 son living    Former Chartered loss adjuster    Social Determinants of Health    Financial Resource Strain: Not on file  Food Insecurity: Not on file  Transportation Needs: Not on file  Physical Activity: Not on file  Stress: Not on file  Social Connections: Not on file    Outpatient Encounter Medications as of 03/19/2021  Medication Sig  . aspirin EC 81 MG tablet Take 81 mg by mouth daily.  . carvedilol (COREG) 3.125 MG tablet Take 1.5625 mg by mouth 2 (two) times daily with a meal.   . Cholecalciferol 25 MCG (1000 UT) tablet Take 1,000 Units by mouth 2 (two) times daily.  . ciclopirox (PENLAC) 8 % solution Apply topically at bedtime. Apply over nail and surrounding skin. Apply daily over previous coat. After seven (7) days, may remove with alcohol and continue cycle.  . clopidogrel (PLAVIX) 75 MG tablet Take 75 mg by mouth daily.  . cyanocobalamin 1000 MCG tablet Take 1,000 mcg by mouth daily at 12 noon.   . diphenhydrAMINE (BENADRYL) 12.5 MG/5ML elixir Take 12.5 mg by mouth every 6 (six) hours as needed (allergic reaction).  Marland Kitchen EPINEPHrine 0.3 mg/0.3 mL IJ SOAJ injection Inject 0.3 mg into the muscle once.   . escitalopram (LEXAPRO) 20 MG tablet TAKE 1 TABLET BY MOUTH ONCE DAILY. (Patient taking differently: Take 20 mg by mouth at bedtime.)  . Fe Fum-FePoly-Vit C-Vit B3 (INTEGRA) 62.5-62.5-40-3 MG CAPS TAKE (1) CAPSULE BY MOUTH ONCE DAILY.  . Melatonin-Pyridoxine 3-10 MG TABS Take 1 tablet by mouth at bedtime.  . mesalamine (LIALDA) 1.2 g EC tablet Take 1.2 g by mouth 3 (three) times daily with meals.   . mupirocin ointment (BACTROBAN) 2 % Apply to affected area bid (Patient taking differently: Apply 1 application topically 2 (two) times daily as needed (rash in head).)  . nitroGLYCERIN (NITROSTAT) 0.4 MG SL tablet DISSOLVE (1) TABLET UNDER TONGUE AS NEEDED TO RELIEVE CHEST PAIN. MAYREPEAT EVERY 5 MINUTES.  Marland Kitchen ondansetron (ZOFRAN-ODT) 4 MG disintegrating tablet Take 4 mg by mouth daily as needed for nausea.   . pantoprazole (PROTONIX) 20 MG tablet Take 20 mg by mouth  daily.   . prednisoLONE acetate (PRED FORTE) 1 % ophthalmic suspension Place 1 drop into both eyes 2 (two) times daily as needed (for inflammation).  . rizatriptan (MAXALT) 5 MG tablet USE AS DIRECTED  . rosuvastatin (CRESTOR) 5 MG tablet Take 1-2 tablets q day.  . verapamil (CALAN-SR) 120 MG CR tablet Take 120 mg by mouth every evening.    No facility-administered encounter medications on file as of 03/19/2021.    Review of Systems  Constitutional: Negative for appetite change and unexpected weight change.  HENT: Negative for congestion and  sinus pressure.   Respiratory: Negative for cough and chest tightness.        Breathing overall stable.   Cardiovascular: Negative for chest pain and palpitations.       No increased swelling.   Gastrointestinal: Negative for abdominal pain, diarrhea, nausea and vomiting.  Genitourinary: Negative for difficulty urinating and dysuria.  Musculoskeletal: Negative for joint swelling and myalgias.  Skin: Negative for color change and rash.  Neurological: Negative for dizziness and headaches.       Previous light headedness.    Psychiatric/Behavioral: Negative for agitation and dysphoric mood.       Objective:    Physical Exam Vitals reviewed.  Constitutional:      General: She is not in acute distress.    Appearance: Normal appearance.  HENT:     Head: Normocephalic and atraumatic.     Right Ear: External ear normal.     Left Ear: External ear normal.  Eyes:     General: No scleral icterus.       Right eye: No discharge.        Left eye: No discharge.     Conjunctiva/sclera: Conjunctivae normal.  Neck:     Thyroid: No thyromegaly.  Cardiovascular:     Rate and Rhythm: Normal rate and regular rhythm.  Pulmonary:     Effort: No respiratory distress.     Breath sounds: Normal breath sounds. No wheezing.  Abdominal:     General: Bowel sounds are normal.     Palpations: Abdomen is soft.     Tenderness: There is no abdominal tenderness.   Musculoskeletal:        General: No swelling or tenderness.     Cervical back: Neck supple. No tenderness.  Lymphadenopathy:     Cervical: No cervical adenopathy.  Skin:    Findings: No erythema or rash.  Neurological:     Mental Status: She is alert.  Psychiatric:        Mood and Affect: Mood normal.        Behavior: Behavior normal.     BP (!) 148/78   Pulse 69   Temp (!) 96.9 F (36.1 C)   Resp 16   Ht 5' (1.524 m)   Wt 167 lb 3.2 oz (75.8 kg)   SpO2 97%   BMI 32.65 kg/m  Wt Readings from Last 3 Encounters:  03/19/21 167 lb 3.2 oz (75.8 kg)  12/17/20 164 lb (74.4 kg)  11/29/20 166 lb (75.3 kg)     Lab Results  Component Value Date   WBC 7.6 03/17/2021   HGB 13.8 03/17/2021   HCT 40.3 03/17/2021   PLT 307.0 03/17/2021   GLUCOSE 101 (H) 03/17/2021   CHOL 174 03/17/2021   TRIG 117.0 03/17/2021   HDL 50.30 03/17/2021   LDLCALC 101 (H) 03/17/2021   ALT 18 03/17/2021   AST 16 03/17/2021   NA 141 03/17/2021   K 4.0 03/17/2021   CL 106 03/17/2021   CREATININE 0.79 03/17/2021   BUN 17 03/17/2021   CO2 27 03/17/2021   TSH 2.40 11/14/2020   INR 1.0 12/23/2017    CT RENAL STONE STUDY  Result Date: 12/03/2020 CLINICAL DATA:  Flank pain, kidney stone suspected. History of kidney stones, right pelvic pain and right back pain. EXAM: CT ABDOMEN AND PELVIS WITHOUT CONTRAST TECHNIQUE: Multidetector CT imaging of the abdomen and pelvis was performed following the standard protocol without IV contrast. COMPARISON:  CT abdomen pelvis Mar 28, 2017 FINDINGS: Lower chest:  No acute abnormality. Hepatobiliary: Decreased size of the low-density lesion seen along the posterior aspect of the left lobe of the liver which now measures 2.5 cm previously 4.0 cm (series 2, image 15). Also decreased in size is the hypoattenuating lesion seen within the left hepatic parenchyma which now measures 2.1 cm previously 2.9 cm (series 2 image 15). Calcified splenic granuloma left lobe of the liver.  Gallbladder is unremarkable. No biliary ductal dilatation. Pancreas: Unremarkable. No pancreatic ductal dilatation or surrounding inflammatory changes. Spleen: Normal in size without focal abnormality. Adrenals/Urinary Tract: Adrenal glands are unremarkable. Punctate LEFT renal calculi. No RIGHT renal calculi. No focal lesion, or hydronephrosis. Bladder is unremarkable. Stomach/Bowel: Radiopaque tablets in the stomach. No suspicious gastric wall thickening. Appendix appears normal. Scattered sigmoid diverticula. No evidence of bowel wall thickening, distention, or inflammatory changes. Vascular/Lymphatic: Aortic atherosclerosis. No enlarged abdominal or pelvic lymph nodes. Reproductive: Uterus and bilateral adnexa are unremarkable. Other: No abdominopelvic ascites. Musculoskeletal: Mild degenerative change of the lumbar spine. No suspicious lytic or blastic lesion of bone. No acute osseous abnormality. IMPRESSION: 1. Punctate nonobstructing LEFT renal calculi. No RIGHT renal calculi. No hydronephrosis. 2. Decreased size of the low-density lesions within the left lobe of the liver, which are incompletely evaluated in the absence of intravenous contrast, but likely represent a benign etiology such as focal fatty infiltration. However, a more complete assessment of these lesions could be obtained with liver protocol MRI or CT with and without contrast. 3. Scattered sigmoid diverticula without evidence of acute diverticulitis. 4. Aortic atherosclerosis. Aortic Atherosclerosis (ICD10-I70.0). Electronically Signed   By: Dahlia Bailiff MD   On: 12/03/2020 11:00       Assessment & Plan:   Problem List Items Addressed This Visit    Anemia    Follow cbc.       CAD (coronary artery disease)    Continue crestor and aspirin.  Continue risk factor modification.  Symptoms as outlined.  Saw cardiology this week.  Planning for CTA.        Carotid artery stenosis    Continue crestor and aspirin.  Saw [facility].  Being  followed by vascular.        Crohn disease ([place])    On lialda.  Bowels appear to be stable.  Follow.       Easy bruising    Has been on aspirin and plavix.  Per cardiology, can stop plavix and remain on aspirin.        Essential hypertension    Blood pressure as outlined.  Cardiology considering decreasing coreg.  Will continue current medication regimen for now.  Follow pressures.  Follow metabolic panel.       GERD (gastroesophageal reflux disease)    No upper symptoms reported.  On protonix.       Hypercholesterolemia    Continue crestor.  Low cholesterol diet and exercise. Follow lipid panel and liver function tests.        Light headedness    Discussed with cardiology.  Planning for CTA and echo.  They are considering possibly adjusting coreg.  She wants to continue current medication for now until after CTA.  Follow.        Migraine headache    Has seen neurology previously.  Felt had "silent migraine" recently.  No headache now.  Wants to monitor.       Mild depression ([place])    Continue lexapro.  Has good support.  Follow.       Stress  Increase stress - handling.  Continue lexapro.            Einar Pheasant, MD

## 2021-03-27 ENCOUNTER — Encounter (INDEPENDENT_AMBULATORY_CARE_PROVIDER_SITE_OTHER): Payer: Medicare PPO

## 2021-03-27 ENCOUNTER — Ambulatory Visit (INDEPENDENT_AMBULATORY_CARE_PROVIDER_SITE_OTHER): Payer: Medicare PPO | Admitting: Vascular Surgery

## 2021-03-29 NOTE — Assessment & Plan Note (Signed)
Discussed with cardiology.  Planning for CTA and echo.  They are considering possibly adjusting coreg.  She wants to continue current medication for now until after CTA.  Follow.

## 2021-03-29 NOTE — Assessment & Plan Note (Signed)
On lialda.  Bowels appear to be stable.  Follow.

## 2021-03-29 NOTE — Assessment & Plan Note (Signed)
Continue crestor and aspirin.  Saw [facility].  Being followed by vascular.

## 2021-03-29 NOTE — Assessment & Plan Note (Signed)
Continue crestor and aspirin.  Continue risk factor modification.  Symptoms as outlined.  Saw cardiology this week.  Planning for CTA.

## 2021-03-29 NOTE — Assessment & Plan Note (Signed)
Follow cbc.  

## 2021-03-29 NOTE — Assessment & Plan Note (Signed)
Increase stress - handling.  Continue lexapro.

## 2021-03-29 NOTE — Assessment & Plan Note (Signed)
Continue crestor.  Low cholesterol diet and exercise. Follow lipid panel and liver function tests.

## 2021-03-29 NOTE — Assessment & Plan Note (Signed)
No upper symptoms reported.  On protonix.

## 2021-03-29 NOTE — Assessment & Plan Note (Signed)
Blood pressure as outlined.  Cardiology considering decreasing coreg.  Will continue current medication regimen for now.  Follow pressures.  Follow metabolic panel.

## 2021-03-29 NOTE — Assessment & Plan Note (Signed)
Has been on aspirin and plavix.  Per cardiology, can stop plavix and remain on aspirin.

## 2021-03-29 NOTE — Assessment & Plan Note (Signed)
Has seen neurology previously.  Felt had "silent migraine" recently.  No headache now.  Wants to monitor.

## 2021-03-29 NOTE — Assessment & Plan Note (Signed)
Continue lexapro.  Has good support.  Follow.

## 2021-04-01 ENCOUNTER — Other Ambulatory Visit: Payer: Self-pay

## 2021-04-01 DIAGNOSIS — M549 Dorsalgia, unspecified: Secondary | ICD-10-CM | POA: Diagnosis not present

## 2021-04-01 DIAGNOSIS — R1111 Vomiting without nausea: Secondary | ICD-10-CM | POA: Diagnosis not present

## 2021-04-01 DIAGNOSIS — Z79899 Other long term (current) drug therapy: Secondary | ICD-10-CM | POA: Insufficient documentation

## 2021-04-01 DIAGNOSIS — R531 Weakness: Secondary | ICD-10-CM | POA: Diagnosis not present

## 2021-04-01 DIAGNOSIS — Z7982 Long term (current) use of aspirin: Secondary | ICD-10-CM | POA: Insufficient documentation

## 2021-04-01 DIAGNOSIS — I10 Essential (primary) hypertension: Secondary | ICD-10-CM | POA: Insufficient documentation

## 2021-04-01 DIAGNOSIS — E876 Hypokalemia: Secondary | ICD-10-CM | POA: Insufficient documentation

## 2021-04-01 DIAGNOSIS — G4489 Other headache syndrome: Secondary | ICD-10-CM | POA: Diagnosis not present

## 2021-04-01 DIAGNOSIS — Z7902 Long term (current) use of antithrombotics/antiplatelets: Secondary | ICD-10-CM | POA: Insufficient documentation

## 2021-04-01 DIAGNOSIS — R52 Pain, unspecified: Secondary | ICD-10-CM | POA: Diagnosis not present

## 2021-04-01 DIAGNOSIS — Z951 Presence of aortocoronary bypass graft: Secondary | ICD-10-CM | POA: Diagnosis not present

## 2021-04-01 DIAGNOSIS — R55 Syncope and collapse: Secondary | ICD-10-CM | POA: Diagnosis not present

## 2021-04-01 DIAGNOSIS — I2510 Atherosclerotic heart disease of native coronary artery without angina pectoris: Secondary | ICD-10-CM | POA: Diagnosis not present

## 2021-04-01 LAB — CBC
HCT: 39.3 % (ref 36.0–46.0)
Hemoglobin: 13.7 g/dL (ref 12.0–15.0)
MCH: 32.9 pg (ref 26.0–34.0)
MCHC: 34.9 g/dL (ref 30.0–36.0)
MCV: 94.5 fL (ref 80.0–100.0)
Platelets: 295 10*3/uL (ref 150–400)
RBC: 4.16 MIL/uL (ref 3.87–5.11)
RDW: 13.2 % (ref 11.5–15.5)
WBC: 12.5 10*3/uL — ABNORMAL HIGH (ref 4.0–10.5)
nRBC: 0 % (ref 0.0–0.2)

## 2021-04-01 LAB — BASIC METABOLIC PANEL
Anion gap: 11 (ref 5–15)
BUN: 19 mg/dL (ref 8–23)
CO2: 24 mmol/L (ref 22–32)
Calcium: 9.4 mg/dL (ref 8.9–10.3)
Chloride: 102 mmol/L (ref 98–111)
Creatinine, Ser: 1 mg/dL (ref 0.44–1.00)
GFR, Estimated: >60 mL/min (ref >60)
Glucose, Bld: 155 mg/dL — ABNORMAL HIGH (ref 70–99)
Potassium: 3.2 mmol/L — ABNORMAL LOW (ref 3.5–5.1)
Sodium: 137 mmol/L (ref 135–145)

## 2021-04-01 LAB — TROPONIN I (HIGH SENSITIVITY): Troponin I (High Sensitivity): 5 ng/L (ref <18)

## 2021-04-01 NOTE — ED Triage Notes (Signed)
Pt to ED via EMS from home, pt states she began to feel like she was going to pass out and felt "weird and tingly" followed by vomiting multiple times. Pt was given 62m zofran by ems. Pt denies abd pain, states she feels nauseous and she has a headache.

## 2021-04-02 ENCOUNTER — Telehealth (HOSPITAL_COMMUNITY): Payer: Self-pay | Admitting: Emergency Medicine

## 2021-04-02 ENCOUNTER — Emergency Department
Admission: EM | Admit: 2021-04-02 | Discharge: 2021-04-02 | Disposition: A | Discharge status: Home / Self Care | Payer: Medicare PPO | Attending: Emergency Medicine | Admitting: Emergency Medicine

## 2021-04-02 DIAGNOSIS — R55 Syncope and collapse: Secondary | ICD-10-CM

## 2021-04-02 DIAGNOSIS — E876 Hypokalemia: Secondary | ICD-10-CM

## 2021-04-02 DIAGNOSIS — R531 Weakness: Secondary | ICD-10-CM

## 2021-04-02 LAB — TROPONIN I (HIGH SENSITIVITY): Troponin I (High Sensitivity): 8 ng/L (ref <18)

## 2021-04-02 MED ORDER — POTASSIUM CHLORIDE CRYS ER 20 MEQ PO TBCR
40.0000 meq | EXTENDED_RELEASE_TABLET | Freq: Once | ORAL | Status: AC
  Administered 2021-04-02: 40 meq via ORAL
  Filled 2021-04-02: qty 2

## 2021-04-02 NOTE — Discharge Instructions (Addendum)
Call your cardiology team tomorrow to get a follow-up scheduled.  You may want to discuss a Holter monitor and/or new echo.  Here your heart rates and blood pressures looked normal.  Return to the ER if you develop recurrent symptoms and feel like you are to pass out again, develop chest pain, shortness of breath or any other

## 2021-04-02 NOTE — Telephone Encounter (Signed)
Attempted to call patient regarding upcoming cardiac CT appointment. °Left message on voicemail with name and callback number °Reuven Braver RN Navigator Cardiac Imaging °[facility] °[phone] Office °[phone] Cell ° °

## 2021-04-02 NOTE — ED Provider Notes (Signed)
[facility] Emergency Department Provider Note  ____________________________________________   Event Date/Time   First MD Initiated Contact with Patient 04/02/21 0155     (approximate)  I have reviewed the triage vital signs and the nursing notes.   HISTORY  Chief Complaint Weakness and Near Syncope    HPI Morgan Moore is a 65 y.o. female with coronary disease, DVT who comes in for weakness and near syncope.  Patient reports that she was sitting in her recliner when all of a sudden she felt tingly all over her body, had generalized weakness, felt like she was going to pass out and then proceeded to vomit multiple times.  Patient states that her blood pressure was high with EMS.  She states that recently her heart rates have been ranging in the low 50s and she saw her cardiologist no symptoms felt like she most likely will need pacemaker and consider coming down on her carvedilol given.  She does have a history of SVT but no recent recurrences.  She reports that she is getting an recent CTA of her chest to evaluate for stents given she plans to have a reevaluation of her left carotid stenosis at 60%.  She currently denies any numbness, tingling, chest pain, shortness of breath, abdominal pain.  She reports having history of orthostatic hypotension in the past but again states that this happened while sitting.     Past Medical History:  Diagnosis Date  . Allergy   . Anemia   . Anxiety   . Chicken pox   . Complication of anesthesia    vomitting  . Coronary artery disease   . Crohn disease ([place])   . DVT (deep venous thrombosis) ([place]) 2017  . Dyspnea    on excertion  . Dysrhythmia 2019   palpitations  . GERD (gastroesophageal reflux disease)   . Heart disease   . Heart murmur   . Heart murmur   . History of kidney stones   . Hyperlipidemia   . Hypertension   . Inflammatory bowel disease   . Migraines    hormonal, puberty  . Myocardial  infarction ([place])   . Phlebitis   . PONV (postoperative nausea and vomiting)     Patient Active Problem List   Diagnosis Date Noted  . Abdominal pain 11/30/2020  . Toenail fungus 11/24/2020  . Back pain 11/24/2020  . Carotid artery stenosis 07/28/2020  . Leg skin lesion, right 03/24/2020  . Shingles 12/10/2019  . Abnormal mammogram 12/02/2019  . Thickened endometrium 10/29/2019  . Fluid in endometrial cavity 01/08/2019  . Hematochezia 11/11/2018  . Rectal polyp 10/28/2018  . Vitamin D deficiency 01/18/2018  . Iron deficiency anemia secondary to inadequate dietary iron intake 01/17/2018  . Easy bruising 12/23/2017  . Anemia 12/23/2017  . Flexor carpi ulnaris tenosynovitis 12/03/2017  . Carpal tunnel syndrome, right 10/15/2017  . Ganglion of right wrist 10/15/2017  . Leg length inequality 10/15/2017  . Lumbar spondylosis 10/15/2017  . Osteoporosis without current pathological fracture 09/09/2017  . Heart murmur 09/09/2017  . Mild depression ([place]) 09/09/2017  . Myocardial infarction ([place])   . Crohn disease ([place])   . Chest pain 04/12/2017  . Right arm pain 01/16/2017  . History of frequent urinary tract infections 08/09/2016  . Heart palpitations 08/03/2016  . Weakness 07/02/2016  . CAD (coronary artery disease) 04/17/2016  . Light headedness 04/17/2016  . Hip pain 04/13/2016  . Health care maintenance 01/19/2016  . Essential hypertension 11/07/2015  .  Hypercholesterolemia 11/07/2015  . Migraine headache 11/07/2015  . Nephrolithiasis 11/07/2015  . GERD (gastroesophageal reflux disease) 11/07/2015  . Mitral regurgitation 11/07/2015  . Stress 11/07/2015  . Varicose veins of both lower extremities with pain 05/10/2015  . Pharyngoesophageal dysphagia 03/11/2015  . Pancreatic lesion 04/25/2014  . Hemangioma of liver 03/12/2014  . Arthralgia 05/29/2011    Past Surgical History:  Procedure Laterality Date  . BREAST BIOPSY Left 07/05/2019   FIBROEPITHELIAL lesion/neg  .  BREAST CYST ASPIRATION     unsure of side  . CARPAL TUNNEL RELEASE Right 12/02/2017   Procedure: CARPAL TUNNEL RELEASE ENDOSCOPIC;  Surgeon: Corky Mull, MD;  Location: [facility] ORS;  Service: Orthopedics;  Laterality: Right;  . CORONARY ANGIOPLASTY WITH STENT PLACEMENT  2017   x 2  . EYE SURGERY Bilateral    cataract surgery  . GANGLION CYST EXCISION Right 12/02/2017   Procedure: REMOVAL GANGLION OF WRIST;  Surgeon: Corky Mull, MD;  Location: [facility] ORS;  Service: Orthopedics;  Laterality: Right;  . heart murmur    . HYSTEROSCOPY WITH D & C N/A 04/19/2020   Procedure: DILATATION AND CURETTAGE /HYSTEROSCOPY, POSSIBLE  POLYPECTOMY;  Surgeon: Benjaman Kindler, MD;  Location: [facility] ORS;  Service: Gynecology;  Laterality: N/A;  . TONSILLECTOMY AND ADENOIDECTOMY  1962  . TUBAL LIGATION  1993  . tubaligation  1990    Prior to Admission medications   Medication Sig Start Date End Date Taking? Authorizing Provider  aspirin EC 81 MG tablet Take 81 mg by mouth daily.    [provider]  carvedilol (COREG) 3.125 MG tablet Take 1.5625 mg by mouth 2 (two) times daily with a meal.     [provider]  Cholecalciferol 25 MCG (1000 UT) tablet Take 1,000 Units by mouth 2 (two) times daily.    [provider]  ciclopirox (PENLAC) 8 % solution Apply topically at bedtime. Apply over nail and surrounding skin. Apply daily over previous coat. After seven (7) days, may remove with alcohol and continue cycle. 11/19/20   Einar Pheasant, MD  clopidogrel (PLAVIX) 75 MG tablet Take 75 mg by mouth daily.    [provider]  cyanocobalamin 1000 MCG tablet Take 1,000 mcg by mouth daily at 12 noon.     [provider]  diphenhydrAMINE (BENADRYL) 12.5 MG/5ML elixir Take 12.5 mg by mouth every 6 (six) hours as needed (allergic reaction).    [provider]  EPINEPHrine 0.3 mg/0.3 mL IJ SOAJ injection Inject 0.3 mg into the muscle once.  12/18/13   [provider]   escitalopram (LEXAPRO) 20 MG tablet TAKE 1 TABLET BY MOUTH ONCE DAILY. Patient taking differently: Take 20 mg by mouth at bedtime. 03/15/19   Einar Pheasant, MD  Fe Fum-FePoly-Vit C-Vit B3 (INTEGRA) 62.5-62.5-40-3 MG CAPS TAKE (1) CAPSULE BY MOUTH ONCE DAILY. 03/04/21   Einar Pheasant, MD  Melatonin-Pyridoxine 3-10 MG TABS Take 1 tablet by mouth at bedtime.    [provider]  mesalamine (LIALDA) 1.2 g EC tablet Take 1.2 g by mouth 3 (three) times daily with meals.     [provider]  mupirocin ointment (BACTROBAN) 2 % Apply to affected area bid Patient taking differently: Apply 1 application topically 2 (two) times daily as needed (rash in head). 03/13/20   Einar Pheasant, MD  nitroGLYCERIN (NITROSTAT) 0.4 MG SL tablet DISSOLVE (1) TABLET UNDER TONGUE AS NEEDED TO RELIEVE CHEST PAIN. MAYREPEAT EVERY 5 MINUTES. 12/09/20   Einar Pheasant, MD  ondansetron (ZOFRAN-ODT) 4 MG  disintegrating tablet Take 4 mg by mouth daily as needed for nausea.  09/14/14   [provider]  pantoprazole (PROTONIX) 20 MG tablet Take 20 mg by mouth daily.  03/01/19 04/01/20  [provider]  prednisoLONE acetate (PRED FORTE) 1 % ophthalmic suspension Place 1 drop into both eyes 2 (two) times daily as needed (for inflammation).    [provider]  rizatriptan (MAXALT) 5 MG tablet USE AS DIRECTED 04/28/16   Einar Pheasant, MD  rosuvastatin (CRESTOR) 5 MG tablet Take 1-2 tablets q day. 11/19/20   Einar Pheasant, MD  verapamil (CALAN-SR) 120 MG CR tablet Take 120 mg by mouth every evening.     [provider]    Allergies Ciprofloxacin, Keflex [cephalexin], Lisinopril, Meclizine, Monurol [fosfomycin tromethamine  (obsolete)], Sulfa antibiotics, Tessalon [benzonatate], Influenza virus vaccine, and Codeine  Family History  Problem Relation Age of Onset  . Heart disease Father   . Heart disease Brother   . Cancer Maternal Aunt   . Breast cancer Maternal Aunt   . Stroke  Mother   . Kidney disease Neg Hx   . GU problems Neg Hx   . Kidney cancer Neg Hx     Social History Social History   Tobacco Use  . Smoking status: Never Smoker  . Smokeless tobacco: Never Used  Vaping Use  . Vaping Use: Never used  Substance Use Topics  . Alcohol use: No    Alcohol/week: 0.0 standard drinks  . Drug use: No      Review of Systems Constitutional: No fever/chills, lightheadedness Eyes: No visual changes. ENT: No sore throat. Cardiovascular: Denies chest pain. Respiratory: Denies shortness of breath. Gastrointestinal: No abdominal pain.  Positive nausea and vomiting no diarrhea.  No constipation. Genitourinary: Negative for dysuria. Musculoskeletal: Negative for back pain. Skin: Negative for rash. Neurological: Negative for headaches, focal weakness.  Tingling sensation All other ROS negative ____________________________________________   PHYSICAL EXAM:  VITAL SIGNS: ED Triage Vitals  Enc Vitals Group     BP 04/01/21 2227 (!) 182/75     Pulse Rate 04/01/21 2227 79     Resp 04/01/21 2227 18     Temp 04/01/21 2227 98.7 F (37.1 C)     Temp Source 04/01/21 2227 Oral     SpO2 04/01/21 2227 94 %     Weight 04/01/21 2222 167 lb 3.2 oz (75.8 kg)     Height 04/01/21 2222 5' (1.524 m)     Head Circumference --      Peak Flow --      Pain Score 04/01/21 2221 8     Pain Loc --      Pain Edu? --      Excl. in [place]? --     Constitutional: Alert and oriented. Well appearing and in no acute distress. Eyes: Conjunctivae are normal. EOMI. Head: Atraumatic. Nose: No congestion/rhinnorhea. Mouth/Throat: Mucous membranes are moist.   Neck: No stridor. Trachea Midline. FROM Cardiovascular: Normal rate, regular rhythm. Grossly normal heart sounds.  Good peripheral circulation. Respiratory: Normal respiratory effort.  No retractions. Lungs CTAB. Gastrointestinal: Soft and nontender. No distention. No abdominal bruits.  Musculoskeletal: No lower extremity  tenderness nor edema.  No joint effusions. Neurologic:  Normal speech and language. No gross focal neurologic deficits are appreciated.  Cranial nerves II through XII are intact.  Equal strength in arms and legs.  Sensation equal throughout Skin:  Skin is warm, dry and intact. No rash noted. Psychiatric: Mood and affect are normal. Speech  and behavior are normal. GU: Deferred   ____________________________________________   LABS (all labs ordered are listed, but only abnormal results are displayed)  Labs Reviewed  BASIC METABOLIC PANEL - Abnormal; Notable for the following components:      Result Value   Potassium 3.2 (*)    Glucose, Bld 155 (*)    All other components within normal limits  CBC - Abnormal; Notable for the following components:   WBC 12.5 (*)    All other components within normal limits  TROPONIN I (HIGH SENSITIVITY)  TROPONIN I (HIGH SENSITIVITY)   ____________________________________________   ED ECG REPORT I, Vanessa Mauldin, the attending physician, personally viewed and interpreted this ECG.  Normal sinus rate of 68, no ST elevation, no T wave inversions, normal intervals  ____________________________________________     PROCEDURES  Procedure(s) performed (including Critical Care):  .1-3 Lead EKG Interpretation Performed by: Vanessa [place], MD Authorized by: Vanessa [place], MD     Interpretation: normal     ECG rate:  60s   ECG rate assessment: normal     Rhythm: sinus rhythm     Ectopy: none     Conduction: normal       ____________________________________________   INITIAL IMPRESSION / ASSESSMENT AND PLAN / ED COURSE  Morgan Moore was evaluated in Emergency Department on 04/02/2021 for the symptoms described in the history of present illness. She was evaluated in the context of the global COVID-19 pandemic, which necessitated consideration that the patient might be at risk for infection with the SARS-CoV-2 virus that causes  COVID-19. Institutional protocols and algorithms that pertain to the evaluation of patients at risk for COVID-19 are in a state of rapid change based on information released by regulatory bodies including the CDC and federal and state organizations. These policies and algorithms were followed during the patient's care in the ED.    Patient comes in for near syncopal episode.  Labs ordered to evaluate for Electra abnormalities, AKI, troponins to evaluate for ACS.  Patient will be kept on the cardiac monitor.  Patient is neuro exam is reassuring therefore unlikely stroke.  Does not really sound like a TIA.  Could be related to patient's low heart rates with a right now her heart rates are normal.  Discussed with patient that she needs to follow-up with her cardiology doctor for consideration of Holter monitor and consideration of repeat echo.  Her last echo in 2017 was normal.  She denies any shortness of breath or symptoms to suggest COVID.  Cardiac markers are negative x2.  Potassium slightly low at 3.2 will give some oral repletion.  Cardiac monitor while I was in the room showed only sinus with rates in the 60s.  Orthostatics were negative.  Reevaluated patient she states that she is doing much better denies any symptoms at this time.  Patient feels comfortable following up with her cardiologist and will return to the ER if she has recurrent symptoms or any other concerns  I discussed the provisional nature of ED diagnosis, the treatment so far, the ongoing plan of care, follow up appointments and return precautions with the patient and any family or support people present. They expressed understanding and agreed with the plan, discharged home.           ____________________________________________   FINAL CLINICAL IMPRESSION(S) / ED DIAGNOSES   Final diagnoses:  Near syncope  Weakness  Hypokalemia      MEDICATIONS GIVEN DURING THIS VISIT:  Medications  potassium chloride SA  (KLOR-CON) CR tablet 40 mEq (40 mEq Oral Given 04/02/21 0346)     ED Discharge Orders    None       Note:  This document was prepared using Dragon voice recognition software and may include unintentional dictation errors.   Vanessa [place], MD 04/02/21 [phone]

## 2021-04-02 NOTE — Telephone Encounter (Signed)
Pt returning phone call regarding upcoming cardiac imaging study; pt verbalizes understanding of appt date/time, parking situation and where to check in, pre-test NPO status and medications ordered, and verified current allergies; name and call back number provided for further questions should they arise Marchia Bond RN Navigator Cardiac Imaging [facility] [phone] office [phone] cell  Daily meds per usual

## 2021-04-03 ENCOUNTER — Other Ambulatory Visit: Payer: Self-pay

## 2021-04-03 ENCOUNTER — Ambulatory Visit
Admission: RE | Admit: 2021-04-03 | Discharge: 2021-04-03 | Disposition: A | Discharge status: Home / Self Care | Payer: Medicare PPO | Source: Ambulatory Visit | Attending: Internal Medicine | Admitting: Internal Medicine

## 2021-04-03 ENCOUNTER — Other Ambulatory Visit: Payer: Self-pay | Admitting: Internal Medicine

## 2021-04-03 DIAGNOSIS — R931 Abnormal findings on diagnostic imaging of heart and coronary circulation: Secondary | ICD-10-CM

## 2021-04-03 DIAGNOSIS — I25118 Atherosclerotic heart disease of native coronary artery with other forms of angina pectoris: Secondary | ICD-10-CM | POA: Diagnosis not present

## 2021-04-03 DIAGNOSIS — I6529 Occlusion and stenosis of unspecified carotid artery: Secondary | ICD-10-CM | POA: Insufficient documentation

## 2021-04-03 DIAGNOSIS — I2510 Atherosclerotic heart disease of native coronary artery without angina pectoris: Secondary | ICD-10-CM | POA: Diagnosis not present

## 2021-04-03 MED ORDER — NITROGLYCERIN 0.4 MG SL SUBL
0.8000 mg | SUBLINGUAL_TABLET | Freq: Once | SUBLINGUAL | Status: AC
  Administered 2021-04-03: 0.8 mg via SUBLINGUAL

## 2021-04-03 MED ORDER — DILTIAZEM HCL 25 MG/5ML IV SOLN
10.0000 mg | Freq: Once | INTRAVENOUS | Status: AC
  Administered 2021-04-03: 10 mg via INTRAVENOUS

## 2021-04-03 MED ORDER — IOHEXOL 350 MG/ML SOLN
90.0000 mL | Freq: Once | INTRAVENOUS | Status: AC | PRN
  Administered 2021-04-03: 90 mL via INTRAVENOUS

## 2021-04-03 MED ORDER — METOPROLOL TARTRATE 5 MG/5ML IV SOLN
10.0000 mg | Freq: Once | INTRAVENOUS | Status: AC
  Administered 2021-04-03: 10 mg via INTRAVENOUS

## 2021-04-03 MED ORDER — METOPROLOL TARTRATE 5 MG/5ML IV SOLN
5.0000 mg | Freq: Once | INTRAVENOUS | Status: AC
  Administered 2021-04-03: 5 mg via INTRAVENOUS

## 2021-04-03 NOTE — Progress Notes (Signed)
Patient tolerated CT well. Drank water after. Vital signs stable encourage to drink water throughout day.Reasons explained and verbalized understanding. Ambulated steady gait.

## 2021-04-08 ENCOUNTER — Ambulatory Visit (INDEPENDENT_AMBULATORY_CARE_PROVIDER_SITE_OTHER): Payer: Medicare PPO

## 2021-04-08 ENCOUNTER — Other Ambulatory Visit (INDEPENDENT_AMBULATORY_CARE_PROVIDER_SITE_OTHER): Payer: Self-pay | Admitting: Vascular Surgery

## 2021-04-08 VITALS — Ht 60.0 in | Wt 167.0 lb

## 2021-04-08 DIAGNOSIS — I6529 Occlusion and stenosis of unspecified carotid artery: Secondary | ICD-10-CM

## 2021-04-08 DIAGNOSIS — Z1231 Encounter for screening mammogram for malignant neoplasm of breast: Secondary | ICD-10-CM | POA: Diagnosis not present

## 2021-04-08 DIAGNOSIS — Z0000 Encounter for general adult medical examination without abnormal findings: Secondary | ICD-10-CM

## 2021-04-08 DIAGNOSIS — I83813 Varicose veins of bilateral lower extremities with pain: Secondary | ICD-10-CM

## 2021-04-08 NOTE — Addendum Note (Signed)
Addended by: Dia Crawford on: 04/08/2021 09:28 AM   Modules accepted: Orders, SmartSet

## 2021-04-08 NOTE — Progress Notes (Signed)
MRN : [id number]  Morgan Moore is a 65 y.o. ([birth year]) female who presents with chief complaint of No chief complaint on file. Marland Kitchen  History of Present Illness:   The patient is seen for follow up evaluation of carotid stenosis. The carotid stenosis followed by ultrasound.   The patient denies amaurosis fugax. There is no recent history of TIA symptoms or focal motor deficits. There is no prior documented CVA.  The patient is taking enteric-coated aspirin 81 mg daily.  There is no history of migraine headaches. There is no history of seizures.  She is also being followed for painful varicose veins. The patient returns for followup evaluation 6 months after the initial visit. The patient continues to have pain in the lower extremities with dependency. The pain is lessened with elevation. Graduated compression stockings, Class I (20-30 mmHg), have been worn but the stockings do not eliminate the leg pain. Over-the-counter analgesics do not improve the symptoms. The degree of discomfort continues to interfere with daily activities. The patient notes the pain in the legs is causing problems with daily exercise, at the workplace and even with household activities and maintenance such as standing in the kitchen preparing meals and doing dishes.   The patient has a history of coronary artery disease, no recent episodes of angina or shortness of breath. The patient denies PAD or claudication symptoms. There is a history of hyperlipidemia which is being treated with a statin.    Carotid Duplex done today shows LTJQ=3-00% and LICA=40-59%.  No change compared to last study in 10/07/2020  (Previous study Duplex ultrasound carotid arteries demonstrates no evidence of stenosis in the right ICA and 40 to 59% stenosis in the left ICA.  Vertebral arteries are antegrade bilaterally.)  Venous ultrasound shows normal deep venous system, no evidence of acute or chronic DVT.  Superficial reflux is not  present    No outpatient medications have been marked as taking for the 04/10/21 encounter (Appointment) with Delana Meyer, Dolores Lory, MD.    Past Medical History:  Diagnosis Date  . Allergy   . Anemia   . Anxiety   . Chicken pox   . Complication of anesthesia    vomitting  . Coronary artery disease   . Crohn disease ([place])   . DVT (deep venous thrombosis) ([place]) 2017  . Dyspnea    on excertion  . Dysrhythmia 2019   palpitations  . GERD (gastroesophageal reflux disease)   . Heart disease   . Heart murmur   . Heart murmur   . History of kidney stones   . Hyperlipidemia   . Hypertension   . Inflammatory bowel disease   . Migraines    hormonal, puberty  . Myocardial infarction ([place])   . Phlebitis   . PONV (postoperative nausea and vomiting)     Past Surgical History:  Procedure Laterality Date  . BREAST BIOPSY Left 07/05/2019   FIBROEPITHELIAL lesion/neg  . BREAST CYST ASPIRATION     unsure of side  . CARPAL TUNNEL RELEASE Right 12/02/2017   Procedure: CARPAL TUNNEL RELEASE ENDOSCOPIC;  Surgeon: Corky Mull, MD;  Location: [facility] ORS;  Service: Orthopedics;  Laterality: Right;  . CORONARY ANGIOPLASTY WITH STENT PLACEMENT  2017   x 2  . EYE SURGERY Bilateral    cataract surgery  . GANGLION CYST EXCISION Right 12/02/2017   Procedure: REMOVAL GANGLION OF WRIST;  Surgeon: Corky Mull, MD;  Location: [facility];  Service: Orthopedics;  Laterality: Right;  .  heart murmur    . HYSTEROSCOPY WITH D & C N/A 04/19/2020   Procedure: DILATATION AND CURETTAGE /HYSTEROSCOPY, POSSIBLE  POLYPECTOMY;  Surgeon: Benjaman Kindler, MD;  Location: [facility] OR[facility];  Service: Gynecology;  Laterality: N/A;  . TONSILLECTOMY AND ADENOIDECTOMY  1962  . TUBAL LIGATION  1993  . tubaligation  1990    Social History Social History   Tobacco Use  . Smoking status: Never Smoker  . Smokeless tobacco: Never Used  Vaping Use  . Vaping Use: Never used  Substance Use Topics  . Alcohol use: No    Alcohol/week:  0.0 standard drinks  . Drug use: No    Family History Family History  Problem Relation Age of Onset  . Heart disease Father   . Heart disease Brother   . Cancer Maternal Aunt   . Breast cancer Maternal Aunt   . Stroke Mother   . Kidney disease Neg Hx   . GU problems Neg Hx   . Kidney cancer Neg Hx     Allergies  Allergen Reactions  . Ciprofloxacin Anaphylaxis  . Keflex [Cephalexin] Anaphylaxis  . Lisinopril Anaphylaxis  . Meclizine Anaphylaxis  . Monurol [Fosfomycin Tromethamine  (Obsolete)] Rash    Splotchy rash all over body. Breathing became difficult/tightened. Took benadryl immediately so she did not swell   . Sulfa Antibiotics Anaphylaxis  . Tessalon [Benzonatate] Anaphylaxis  . Influenza Virus Vaccine Other (See Comments)    History of [name]  . Codeine Nausea And Vomiting     REVIEW OF SYSTEMS (Negative unless checked)  Constitutional: [] Weight loss  [] Fever  [] Chills Cardiac: [] Chest pain   [] Chest pressure   [] Palpitations   [] Shortness of breath when laying flat   [] Shortness of breath with exertion. Vascular:  [] Pain in legs with walking   [] Pain in legs at rest  [] History of DVT   [] Phlebitis   [x] Swelling in legs   [x] Varicose veins   [] Non-healing ulcers Pulmonary:   [] Uses home oxygen   [] Productive cough   [] Hemoptysis   [] Wheeze  [] COPD   [] Asthma Neurologic:  [] Dizziness   [] Seizures   [] History of stroke   [] History of TIA  [] Aphasia   [] Vissual changes   [] Weakness or numbness in arm   [] Weakness or numbness in leg Musculoskeletal:   [] Joint swelling   [] Joint pain   [] Low back pain Hematologic:  [] Easy bruising  [] Easy bleeding   [] Hypercoagulable state   [] Anemic Gastrointestinal:  [] Diarrhea   [] Vomiting  [] Gastroesophageal reflux/heartburn   [] Difficulty swallowing. Genitourinary:  [] Chronic kidney disease   [] Difficult urination  [] Frequent urination   [] Blood in urine Skin:  [] Rashes   [] Ulcers  Psychological:  [] History of anxiety   []   History of major depression.  Physical Examination  There were no vitals filed for this visit. There is no height or weight on file to calculate BMI. Gen: WD/WN, NAD Head: [place]/AT, No temporalis wasting.  Ear/Nose/Throat: Hearing grossly intact, nares w/o erythema or drainage Eyes: PER, EOMI, sclera nonicteric.  Neck: Supple, no large masses.   Pulmonary:  Good air movement, no audible wheezing bilaterally, no use of accessory muscles.  Cardiac: RRR, no JVD Vascular: left carotid bruit noted; scattered small varicosities present.  1+ soft pitting edema Vessel Right Left  Radial Palpable Palpable  Carotid Palpable Palpable  PT Palpable Palpable  DP Palpable Palpable  Gastrointestinal: Non-distended. No guarding/no peritoneal signs.  Musculoskeletal: M/S 5/5 throughout.  No deformity or atrophy.  Neurologic: CN 2-12 intact. Symmetrical.  Speech  is fluent. Motor exam as listed above. Psychiatric: Judgment intact, Mood & affect appropriate for pt's clinical situation. Dermatologic: Mild venous rashes no ulcers noted.  No changes consistent with cellulitis. Lymph : No lichenification or skin changes of chronic lymphedema.  CBC Lab Results  Component Value Date   WBC 12.5 (H) 04/01/2021   HGB 13.7 04/01/2021   HCT 39.3 04/01/2021   MCV 94.5 04/01/2021   PLT 295 04/01/2021    BMET    Component Value Date/Time   NA 137 04/01/2021 2228   NA 135 02/25/2015 0950   K 3.2 (L) 04/01/2021 2228   K 3.8 02/25/2015 0950   CL 102 04/01/2021 2228   CL 97 (L) 02/25/2015 0950   CO2 24 04/01/2021 2228   CO2 29 02/25/2015 0950   GLUCOSE 155 (H) 04/01/2021 2228   GLUCOSE 100 (H) 02/25/2015 0950   BUN 19 04/01/2021 2228   BUN 12 02/25/2015 0950   CREATININE 1.00 04/01/2021 2228   CREATININE 0.83 02/25/2015 0950   CALCIUM 9.4 04/01/2021 2228   CALCIUM 8.9 02/25/2015 0950   GFRNONAA >60 04/01/2021 2228   GFRNONAA >60 02/25/2015 0950   GFRAA >60 11/19/2017 1217   GFRAA >60 02/25/2015 0950    Estimated Creatinine Clearance: 50.3 mL/min (by C-G formula based on SCr of 1 mg/dL).  COAG Lab Results  Component Value Date   INR 1.0 12/23/2017   INR 1.1 09/21/2014    Radiology CT CORONARY MORPH W/CTA COR W/SCORE W/CA W/CM &/OR WO/CM  Addendum Date: 04/04/2021   ADDENDUM REPORT: 04/04/2021 16:29 EXAM: OVER-READ INTERPRETATION  CT CHEST The following report is an over-read performed by radiologist Dr. Rebekah Chesterfield [facility] on 04/04/2021. This over-read does not include interpretation of cardiac or coronary anatomy or pathology. The coronary calcium score and cardiac CTA interpretation by the cardiologist is attached. COMPARISON:  None. FINDINGS: Within the visualized portions of the thorax there are no suspicious appearing pulmonary nodules or masses, there is no acute consolidative airspace disease, no pleural effusions, no pneumothorax and no lymphadenopathy. Visualized portions of the upper abdomen demonstrates some hypervascular lesions in the right lobe of the liver which are incompletely characterized on today's arterial phase examination. There are no aggressive appearing lytic or blastic lesions noted in the visualized portions of the skeleton. IMPRESSION: 1. Hypervascular lesions in the right lobe of the liver, incompletely characterized on today's arterial phase examination. Further evaluation with nonemergent abdominal MRI with and without IV gadolinium is strongly recommended in the near future to exclude neoplasm. Electronically Signed   By: Vinnie Langton M.D.   On: 04/04/2021 16:29   Result Date: 04/04/2021 CLINICAL DATA:  Chest pain, hx of cad/pci to LCx EXAM: Cardiac/Coronary  CTA TECHNIQUE: The patient was scanned on a Siemens Somatom go.Top scanner. : A retrospective scan was triggered in the descending thoracic aorta. Axial non-contrast 3 mm slices were carried out through the heart. The data set was analyzed on a dedicated work station and scored using  the [place]. Gantry rotation speed was 330 msecs and collimation was .6 mm. 111m of metoprolol and 0.8 mg of sl NTG was given. The 3D data set was reconstructed in 5% intervals of the 60-95 % of the R-R cycle. Diastolic phases were analyzed on a dedicated work station using MPR, MIP and VRT modes. The patient received 90 cc of contrast. FINDINGS: Aorta:  Normal size.  No calcifications.  No dissection. Aortic Valve:  Trileaflet.  No calcifications. Coronary Arteries:  Normal  coronary origin.  Right dominance. RCA is a dominant artery that gives rise to PDA and PLA. There is calcified plaque in the proximal segment causing minimal (<25%) stenosis. Left main is a large artery that gives rise to LAD and LCX arteries. LAD has calcified plaque in the proximal segment causing severe stenosis (>70%) LCX is a non-dominant artery that gives rise to three obtuse marginal branches. There is a stent in the proximal to mid LCx. Cannot evaluate for in-stent stenosis due to calcium/metal artifacts. Other findings: Normal pulmonary vein drainage into the left atrium. Normal left atrial appendage without a thrombus. Normal size of the pulmonary artery. IMPRESSION: 1. Coronary calcium score not obtained due to presence of LCx stent (results will be falsely elevated) 2. Normal coronary origin with right dominance. 3. Severe stenosis in the proximal LAD 4. Coronary stents present in the LCx, obstructive CAD (in stent stenosis) can't be excluded in the LCx 5. CAD-RADS 4 Severe stenosis. (70-99% or > 50% left main). Cardiac catheterization or CT FFR is recommended. CT FFR will be submitted and reported separately. Consider symptom-guided anti-ischemic pharmacotherapy as well as risk factor modification per guideline directed care. Electronically Signed: By: Kate Sable M.D. On: 04/03/2021 14:12     Assessment/Plan 1. Stenosis of carotid artery, unspecified laterality Recommend:  Given the patient's asymptomatic  subcritical stenosis no further invasive testing or surgery at this time.  Carotid Duplex done today shows RFFM=3-84% and LICA=40-59%.   Continue antiplatelet therapy as prescribed Continue management of CAD, HTN and Hyperlipidemia Healthy heart diet,  encouraged exercise at least 4 times per week Follow up in 12 months with duplex ultrasound and physical exam  - VAS US CAROTID; Future  2. Varicose veins of both lower extremities with pain Recommend:  The patient is complaining of varicose veins.    I have had a long discussion with the patient regarding  varicose veins and why they cause symptoms.  Patient will begin wearing graduated compression stockings on a daily basis, beginning first thing in the morning and removing them in the evening. The patient is instructed specifically not to sleep in the stockings.    The patient  will also begin using over-the-counter analgesics such as Motrin 600 mg po TID to help control the symptoms as needed.    In addition, behavioral modification including elevation during the day will be initiated, utilizing a recliner was recommended.  The patient is also instructed to continue exercising such as walking 4-5 times per week.  At this time the patient wishes to continue conservative therapy and is not interested in more invasive treatments such as laser ablation and sclerotherapy.  3. Essential hypertension Continue antihypertensive medications as already ordered, these medications have been reviewed and there are no changes at this time.   4. Coronary artery disease involving native coronary artery of native heart without angina pectoris Continue cardiac and antihypertensive medications as already ordered and reviewed, no changes at this time.  Continue statin as ordered and reviewed, no changes at this time  Nitrates PRN for chest pain   5. Hypercholesterolemia Continue statin as ordered and reviewed, no changes at this time    Hortencia Pilar, MD  04/08/2021 8:48 AM

## 2021-04-08 NOTE — Progress Notes (Signed)
Subjective:   Paislynn Hegstrom is a 65 y.o. female who presents for an Initial Medicare Annual Wellness Visit.  Review of Systems    No ROS.  Medicare Wellness Virtual Visit.          Objective:    Today's Vitals   04/08/21 0823  Weight: 167 lb (75.8 kg)  Height: 5' (1.524 m)   Body mass index is 32.61 kg/m.  Advanced Directives 04/08/2021 04/01/2021 04/19/2020 04/09/2020 11/19/2017 08/13/2017 04/12/2017  Does Patient Have a Medical Advance Directive? Yes No No Yes No No No  Type of Advance Directive Healthcare Power of [place]  Does patient want to make changes to medical advance directive? No - Patient declined - - No - Patient declined - - -  Copy of [place] in Chart? No - copy requested - - No - copy requested - - -  Would patient like information on creating a medical advance directive? - - No - Patient declined No - Patient declined No - Patient declined No - Patient declined No - Patient declined    Current Medications (verified) Outpatient Encounter Medications as of 04/08/2021  Medication Sig  . aspirin EC 81 MG tablet Take 81 mg by mouth daily.  . carvedilol (COREG) 3.125 MG tablet Take 1.5625 mg by mouth 2 (two) times daily with a meal.  (Patient not taking: Reported on 04/03/2021)  . Cholecalciferol 25 MCG (1000 UT) tablet Take 1,000 Units by mouth 2 (two) times daily.  . ciclopirox (PENLAC) 8 % solution Apply topically at bedtime. Apply over nail and surrounding skin. Apply daily over previous coat. After seven (7) days, may remove with alcohol and continue cycle.  . clopidogrel (PLAVIX) 75 MG tablet Take 75 mg by mouth daily.  . cyanocobalamin 1000 MCG tablet Take 1,000 mcg by mouth daily at 12 noon.   . diphenhydrAMINE (BENADRYL) 12.5 MG/5ML elixir Take 12.5 mg by mouth every 6 (six) hours as needed (allergic reaction).  Marland Kitchen EPINEPHrine 0.3 mg/0.3 mL IJ SOAJ injection Inject 0.3 mg into the muscle once.   .  escitalopram (LEXAPRO) 20 MG tablet TAKE 1 TABLET BY MOUTH ONCE DAILY. (Patient taking differently: Take 20 mg by mouth at bedtime.)  . Fe Fum-FePoly-Vit C-Vit B3 (INTEGRA) 62.5-62.5-40-3 MG CAPS TAKE (1) CAPSULE BY MOUTH ONCE DAILY.  . Melatonin-Pyridoxine 3-10 MG TABS Take 1 tablet by mouth at bedtime.  . mesalamine (LIALDA) 1.2 g EC tablet Take 1.2 g by mouth 3 (three) times daily with meals.   . mupirocin ointment (BACTROBAN) 2 % Apply to affected area bid (Patient taking differently: Apply 1 application topically 2 (two) times daily as needed (rash in head).)  . nitroGLYCERIN (NITROSTAT) 0.4 MG SL tablet DISSOLVE (1) TABLET UNDER TONGUE AS NEEDED TO RELIEVE CHEST PAIN. MAYREPEAT EVERY 5 MINUTES.  Marland Kitchen ondansetron (ZOFRAN-ODT) 4 MG disintegrating tablet Take 4 mg by mouth daily as needed for nausea.   . pantoprazole (PROTONIX) 20 MG tablet Take 20 mg by mouth daily.   . prednisoLONE acetate (PRED FORTE) 1 % ophthalmic suspension Place 1 drop into both eyes 2 (two) times daily as needed (for inflammation).  . rizatriptan (MAXALT) 5 MG tablet USE AS DIRECTED  . rosuvastatin (CRESTOR) 5 MG tablet Take 1-2 tablets q day.  . verapamil (CALAN-SR) 120 MG CR tablet Take 120 mg by mouth every evening.    No facility-administered encounter medications on file as of 04/08/2021.  Allergies (verified) Ciprofloxacin, Keflex [cephalexin], Lisinopril, Meclizine, Monurol [fosfomycin tromethamine  (obsolete)], Sulfa antibiotics, Tessalon [benzonatate], Influenza virus vaccine, and Codeine   History: Past Medical History:  Diagnosis Date  . Allergy   . Anemia   . Anxiety   . Chicken pox   . Complication of anesthesia    vomitting  . Coronary artery disease   . Crohn disease ([place])   . DVT (deep venous thrombosis) ([place]) 01-06-2016  . Dyspnea    on excertion  . Dysrhythmia 01/05/18   palpitations  . GERD (gastroesophageal reflux disease)   . Heart disease   . Heart murmur   . Heart murmur   . History of  kidney stones   . Hyperlipidemia   . Hypertension   . Inflammatory bowel disease   . Migraines    hormonal, puberty  . Myocardial infarction ([place])   . Phlebitis   . PONV (postoperative nausea and vomiting)    Past Surgical History:  Procedure Laterality Date  . BREAST BIOPSY Left 07/05/2019   FIBROEPITHELIAL lesion/neg  . BREAST CYST ASPIRATION     unsure of side  . CARPAL TUNNEL RELEASE Right 12/02/2017   Procedure: CARPAL TUNNEL RELEASE ENDOSCOPIC;  Surgeon: Corky Mull, MD;  Location: [facility] ORS;  Service: Orthopedics;  Laterality: Right;  . CORONARY ANGIOPLASTY WITH STENT PLACEMENT  2016/01/06   x 2  . EYE SURGERY Bilateral    cataract surgery  . GANGLION CYST EXCISION Right 12/02/2017   Procedure: REMOVAL GANGLION OF WRIST;  Surgeon: Corky Mull, MD;  Location: [facility] ORS;  Service: Orthopedics;  Laterality: Right;  . heart murmur    . HYSTEROSCOPY WITH D & C N/A 04/19/2020   Procedure: DILATATION AND CURETTAGE /HYSTEROSCOPY, POSSIBLE  POLYPECTOMY;  Surgeon: Benjaman Kindler, MD;  Location: [facility] ORS;  Service: Gynecology;  Laterality: N/A;  . TONSILLECTOMY AND ADENOIDECTOMY  1962  . TUBAL LIGATION  1993  . tubaligation  1990   Family History  Problem Relation Age of Onset  . Heart disease Father   . Heart disease Brother   . Cancer Maternal Aunt   . Breast cancer Maternal Aunt   . Stroke Mother   . Kidney disease Neg Hx   . GU problems Neg Hx   . Kidney cancer Neg Hx    Social History   Socioeconomic History  . Marital status: Single    Spouse name: Not on file  . Number of children: Not on file  . Years of education: Not on file  . Highest education level: Not on file  Occupational History  . Not on file  Tobacco Use  . Smoking status: Never Smoker  . Smokeless tobacco: Never Used  Vaping Use  . Vaping Use: Never used  Substance and Sexual Activity  . Alcohol use: No    Alcohol/week: 0.0 standard drinks  . Drug use: No  . Sexual activity: Not on file  Other  Topics Concern  . Not on file  Social History Narrative   Had 2 kids. Daughter died in January 06, 2012 son living    Former Chartered loss adjuster    Social Determinants of Health   Financial Resource Strain: [place]   . Difficulty of Paying Living Expenses: Not hard at all  Food Insecurity: No Food Insecurity  . Worried About Charity fundraiser in the Last Year: Never true  . Ran Out of Food in the Last Year: Never true  Transportation Needs: No Transportation Needs  . Lack of Transportation (  Medical): No  . Lack of Transportation (Non-Medical): No  Physical Activity: Sufficiently Active  . Days of Exercise per Week: 5 days  . Minutes of Exercise per Session: 30 min  Stress: No Stress Concern Present  . Feeling of Stress : Not at all  Social Connections: Unknown  . Frequency of Communication with Friends and Family: More than three times a week  . Frequency of Social Gatherings with Friends and Family: More than three times a week  . Attends Religious Services: More than 4 times per year  . Active Member of Clubs or Organizations: Yes  . Attends Archivist Meetings: More than 4 times per year  . Marital Status: Not on file    Tobacco Counseling Counseling given: Not Answered   Clinical Intake:  Pre-visit preparation completed: Yes        Diabetes: No  How often do you need to have someone help you when you read instructions, pamphlets, or other written materials from your doctor or pharmacy?: 1 - Never   Interpreter Needed?: No      Activities of Daily Living In your present state of health, do you have any difficulty performing the following activities: 04/09/2020  Hearing? N  Vision? N  Difficulty concentrating or making decisions? N  Walking or climbing stairs? N  Dressing or bathing? N  Doing errands, shopping? N  Some recent data might be hidden    Patient Care Team: Einar Pheasant, MD as PCP - General (Internal Medicine)  Indicate any recent  Medical Services you may have received from other than [facility] providers in the past year (date may be approximate).     Assessment:   This is a routine wellness examination for Mahealani.  I connected with Lidia today by telephone and verified that I am speaking with the correct person using two identifiers. Location patient: home Location provider: work Persons participating in the virtual visit: patient, Marine scientist.    I discussed the limitations, risks, security and privacy concerns of performing an evaluation and management service by telephone and the availability of in person appointments. The patient expressed understanding and verbally consented to this telephonic visit.    Interactive audio and video telecommunications were attempted between this provider and patient, however failed, due to patient having technical difficulties OR patient did not have access to video capability.  We continued and completed visit with audio only.  Some vital signs may be absent or patient reported.   Hearing/Vision screen  Hearing Screening   125Hz  250Hz  500Hz  1000Hz  2000Hz  3000Hz  4000Hz  6000Hz  8000Hz   Right ear:           Left ear:           Comments: Patient is able to hear conversational tones without difficulty.  No issues reported.  Vision Screening Comments: Followed by [facility] Wears corrective lenses when reading Cataract extraction, bilateral Visual acuity not assessed, virtual visit.  They have seen their ophthalmologist in the last 12 months.     Dietary issues and exercise activities discussed:    Regular diet Fair water intake  Goals Addressed              This Visit's Progress     Patient Stated   .  Follow up with Primary Care Provider (pt-stated)        Monitor blood pressure  Stay hydrated      Depression Screen [facility] 2/9 Scores 04/08/2021 03/19/2021 11/29/2020 07/18/2020 12/08/2019 10/25/2019 05/16/2019  PHQ -  2 Score 0 0 0 0 0 - 0  PHQ- 9 Score 0 1 0 - 0 - 0   Exception Documentation - - - - - Other- indicate reason in comment box -  Not completed - - - - - pt declined depression screening, feeling down due to [place] and lost her daughter. -    Fall Risk Fall Risk  11/29/2020 07/18/2020 10/25/2019 01/18/2018 10/05/2016  Falls in the past year? 0 0 1 No No  Number falls in past yr: 0 0 0 - -  Injury with Fall? 0 0 0 - -  Risk Factor Category  - - - - -  Risk for fall due to : - - - - -  Risk for fall due to: Comment - - - - -  Follow up Falls evaluation completed Falls evaluation completed Falls evaluation completed - -    FALL RISK PREVENTION PERTAINING TO THE HOME: Handrails in use when climbing stairs? Yes Home free of loose throw rugs in walkways, pet beds, electrical cords, etc? Yes  Adequate lighting in your home to reduce risk of falls? Yes   ASSISTIVE DEVICES UTILIZED TO PREVENT FALLS: Life alert? No  Use of a cane, walker or w/c? No   TIMED UP AND GO: Was the test performed? No .   Cognitive Function:  Patient is alert and oriented x3.  Denies difficulty focusing, making decisions, memory loss.  Enjoys socializing with others, volunteering in a greenhouse, sodoku and other brain health activities.  MMSE/6CIT deferred. Normal by direct communication/observation.      Immunizations Immunization History  Administered Date(s) Administered  . Tdap 05/26/2010   Tdap, influenza, shingles, PNA vaccines discontinued per patient.   Health Maintenance Health Maintenance  Topic Date Due  . MAMMOGRAM  06/19/2022  . COLONOSCOPY (Pts 45-32yr Insurance coverage will need to be confirmed)  10/19/2029  . DEXA SCAN  Completed  . Hepatitis C Screening  Completed  . HPV VACCINES  Aged Out  . INFLUENZA VACCINE  Discontinued  . TETANUS/TDAP  Discontinued  . COVID-19 Vaccine  Discontinued  . PNA vac Low Risk Adult  Discontinued  . Zoster Vaccines- Shingrix  Discontinued   Colorectal cancer screening: Type of screening: Colonoscopy.  Completed 10/20/19. Repeat every 10 years  Mammogram status: Completed 06/19/20. Repeat every year  Lung Cancer Screening: (Low Dose CT Chest recommended if Age 66-80years, 30 pack-year currently smoking OR have quit w/in 15years.) does not qualify.   Vision Screening: Recommended annual ophthalmology exams for early detection of glaucoma and other disorders of the eye. Is the patient up to date with their annual eye exam?  Yes  Who is the provider or what is the name of the office in which the patient attends annual eye exams? Dr. BMordecai Rasmussen  Dental Screening: Recommended annual dental exams for proper oral hygiene.  Community Resource Referral / Chronic Care Management: CRR required this visit?  No   CCM required this visit?  No      Plan:   Keep all routine maintenance appointments.   I have personally reviewed and noted the following in the patient's chart:   . Medical and social history . Use of alcohol, tobacco or illicit drugs  . Current medications and supplements including opioid prescriptions. Patient is not currently taking opioid prescriptions. . Functional ability and status . Nutritional status . Physical activity . Advanced directives . List of other physicians . Hospitalizations, surgeries, and ER visits in previous 12 months . Vitals .  Screenings to include cognitive, depression, and falls . Referrals and appointments  In addition, I have reviewed and discussed with patient certain preventive protocols, quality metrics, and best practice recommendations. A written personalized care plan for preventive services as well as general preventive health recommendations were provided to patient via Nunzio Cory, LPN   [id number]/[id number]

## 2021-04-08 NOTE — Patient Instructions (Addendum)
[name] , Thank you for taking time to come for your Medicare Wellness Visit. I appreciate your ongoing commitment to your health goals. Please review the following plan we discussed and let me know if I can assist you in the future.   These are the goals we discussed: Goals      Patient Stated   .  Follow up with Primary Care Provider (pt-stated)      Monitor blood pressure  Stay hydrated       This is a list of the screening recommended for you and due dates:  Health Maintenance  Topic Date Due  . Mammogram  06/19/2022  . Colon Cancer Screening  10/19/2029  . DEXA scan (bone density measurement)  Completed  . Hepatitis C Screening: USPSTF Recommendation to screen - Ages 62-79 yo.  Completed  . HPV Vaccine  Aged Out  . Flu Shot  Discontinued  . Tetanus Vaccine  Discontinued  . COVID-19 Vaccine  Discontinued  . Pneumonia vaccines  Discontinued  . Zoster (Shingles) Vaccine  Discontinued   Advanced directives: End of life planning; Advance aging; Advanced directives discussed.  Copy of current HCPOA/Living Will requested.    Conditions/risks identified: none new.  Follow up in one year for your annual wellness visit.   Preventive Care 19 Years and Older, Female Preventive care refers to lifestyle choices and visits with your health care provider that can promote health and wellness. What does preventive care include?  A yearly physical exam. This is also called an annual well check.  Dental exams once or twice a year.  Routine eye exams. Ask your health care provider how often you should have your eyes checked.  Personal lifestyle choices, including:  Daily care of your teeth and gums.  Regular physical activity.  Eating a healthy diet.  Avoiding tobacco and drug use.  Limiting alcohol use.  Practicing safe sex.  Taking low-dose aspirin every day.  Taking vitamin and mineral supplements as recommended by your health care provider. What happens during  an annual well check? The services and screenings done by your health care provider during your annual well check will depend on your age, overall health, lifestyle risk factors, and family history of disease. Counseling  Your health care provider may ask you questions about your:  Alcohol use.  Tobacco use.  Drug use.  Emotional well-being.  Home and relationship well-being.  Sexual activity.  Eating habits.  History of falls.  Memory and ability to understand (cognition).  Work and work Statistician.  Reproductive health. Screening  You may have the following tests or measurements:  Height, weight, and BMI.  Blood pressure.  Lipid and cholesterol levels. These may be checked every 5 years, or more frequently if you are over 32 years old.  Skin check.  Lung cancer screening. You may have this screening every year starting at age 78 if you have a 30-pack-year history of smoking and currently smoke or have quit within the past 15 years.  Fecal occult blood test (FOBT) of the stool. You may have this test every year starting at age 22.  Flexible sigmoidoscopy or colonoscopy. You may have a sigmoidoscopy every 5 years or a colonoscopy every 10 years starting at age 26.  Hepatitis C blood test.  Hepatitis B blood test.  Sexually transmitted disease (STD) testing.  Diabetes screening. This is done by checking your blood sugar (glucose) after you have not eaten for a while (fasting). You may have this done every  1-3 years.  Bone density scan. This is done to screen for osteoporosis. You may have this done starting at age 54.  Mammogram. This may be done every 1-2 years. Talk to your health care provider about how often you should have regular mammograms. Talk with your health care provider about your test results, treatment options, and if necessary, the need for more tests. Vaccines  Your health care provider may recommend certain vaccines, such as:  Influenza  vaccine. This is recommended every year.  Tetanus, diphtheria, and acellular pertussis (Tdap, Td) vaccine. You may need a Td booster every 10 years.  Zoster vaccine. You may need this after age 18.  Pneumococcal 13-valent conjugate (PCV13) vaccine. One dose is recommended after age 18.  Pneumococcal polysaccharide (PPSV23) vaccine. One dose is recommended after age 17. Talk to your health care provider about which screenings and vaccines you need and how often you need them. This information is not intended to replace advice given to you by your health care provider. Make sure you discuss any questions you have with your health care provider. Document Released: 11/22/2015 Document Revised: 07/15/2016 Document Reviewed: 08/27/2015 Elsevier Interactive Patient Education  2017 [place] Prevention in the Home Falls can cause injuries. They can happen to people of all ages. There are many things you can do to make your home safe and to help prevent falls. What can I do on the outside of my home?  Regularly fix the edges of walkways and driveways and fix any cracks.  Remove anything that might make you trip as you walk through a door, such as a raised step or threshold.  Trim any bushes or trees on the path to your home.  Use bright outdoor lighting.  Clear any walking paths of anything that might make someone trip, such as rocks or tools.  Regularly check to see if handrails are loose or broken. Make sure that both sides of any steps have handrails.  Any raised decks and porches should have guardrails on the edges.  Have any leaves, snow, or ice cleared regularly.  Use sand or salt on walking paths during winter.  Clean up any spills in your garage right away. This includes oil or grease spills. What can I do in the bathroom?  Use night lights.  Install grab bars by the toilet and in the tub and shower. Do not use towel bars as grab bars.  Use non-skid mats or decals  in the tub or shower.  If you need to sit down in the shower, use a plastic, non-slip stool.  Keep the floor dry. Clean up any water that spills on the floor as soon as it happens.  Remove soap buildup in the tub or shower regularly.  Attach bath mats securely with double-sided non-slip rug tape.  Do not have throw rugs and other things on the floor that can make you trip. What can I do in the bedroom?  Use night lights.  Make sure that you have a light by your bed that is easy to reach.  Do not use any sheets or blankets that are too big for your bed. They should not hang down onto the floor.  Have a firm chair that has side arms. You can use this for support while you get dressed.  Do not have throw rugs and other things on the floor that can make you trip. What can I do in the kitchen?  Clean up any spills right away.  Avoid walking on wet floors.  Keep items that you use a lot in easy-to-reach places.  If you need to reach something above you, use a strong step stool that has a grab bar.  Keep electrical cords out of the way.  Do not use floor polish or wax that makes floors slippery. If you must use wax, use non-skid floor wax.  Do not have throw rugs and other things on the floor that can make you trip. What can I do with my stairs?  Do not leave any items on the stairs.  Make sure that there are handrails on both sides of the stairs and use them. Fix handrails that are broken or loose. Make sure that handrails are as long as the stairways.  Check any carpeting to make sure that it is firmly attached to the stairs. Fix any carpet that is loose or worn.  Avoid having throw rugs at the top or bottom of the stairs. If you do have throw rugs, attach them to the floor with carpet tape.  Make sure that you have a light switch at the top of the stairs and the bottom of the stairs. If you do not have them, ask someone to add them for you. What else can I do to help  prevent falls?  Wear shoes that:  Do not have high heels.  Have rubber bottoms.  Are comfortable and fit you well.  Are closed at the toe. Do not wear sandals.  If you use a stepladder:  Make sure that it is fully opened. Do not climb a closed stepladder.  Make sure that both sides of the stepladder are locked into place.  Ask someone to hold it for you, if possible.  Clearly mark and make sure that you can see:  Any grab bars or handrails.  First and last steps.  Where the edge of each step is.  Use tools that help you move around (mobility aids) if they are needed. These include:  Canes.  Walkers.  Scooters.  Crutches.  Turn on the lights when you go into a dark area. Replace any light bulbs as soon as they burn out.  Set up your furniture so you have a clear path. Avoid moving your furniture around.  If any of your floors are uneven, fix them.  If there are any pets around you, be aware of where they are.  Review your medicines with your doctor. Some medicines can make you feel dizzy. This can increase your chance of falling. Ask your doctor what other things that you can do to help prevent falls. This information is not intended to replace advice given to you by your health care provider. Make sure you discuss any questions you have with your health care provider. Document Released: 08/22/2009 Document Revised: 04/02/2016 Document Reviewed: 11/30/2014 Elsevier Interactive Patient Education  2017 [employer].

## 2021-04-10 ENCOUNTER — Ambulatory Visit (INDEPENDENT_AMBULATORY_CARE_PROVIDER_SITE_OTHER): Payer: Medicare PPO

## 2021-04-10 ENCOUNTER — Other Ambulatory Visit: Payer: Self-pay

## 2021-04-10 ENCOUNTER — Encounter (INDEPENDENT_AMBULATORY_CARE_PROVIDER_SITE_OTHER): Payer: Self-pay | Admitting: Vascular Surgery

## 2021-04-10 ENCOUNTER — Ambulatory Visit (INDEPENDENT_AMBULATORY_CARE_PROVIDER_SITE_OTHER): Payer: Medicare PPO | Admitting: Vascular Surgery

## 2021-04-10 VITALS — BP 133/75 | HR 67 | Ht 62.0 in | Wt 165.0 lb

## 2021-04-10 DIAGNOSIS — E7800 Pure hypercholesterolemia, unspecified: Secondary | ICD-10-CM

## 2021-04-10 DIAGNOSIS — I6529 Occlusion and stenosis of unspecified carotid artery: Secondary | ICD-10-CM

## 2021-04-10 DIAGNOSIS — I83813 Varicose veins of bilateral lower extremities with pain: Secondary | ICD-10-CM

## 2021-04-10 DIAGNOSIS — I10 Essential (primary) hypertension: Secondary | ICD-10-CM | POA: Diagnosis not present

## 2021-04-10 DIAGNOSIS — I2510 Atherosclerotic heart disease of native coronary artery without angina pectoris: Secondary | ICD-10-CM

## 2021-04-22 DIAGNOSIS — R011 Cardiac murmur, unspecified: Secondary | ICD-10-CM | POA: Diagnosis not present

## 2021-04-25 DIAGNOSIS — K219 Gastro-esophageal reflux disease without esophagitis: Secondary | ICD-10-CM | POA: Diagnosis not present

## 2021-04-25 DIAGNOSIS — Z955 Presence of coronary angioplasty implant and graft: Secondary | ICD-10-CM | POA: Diagnosis not present

## 2021-04-25 DIAGNOSIS — R001 Bradycardia, unspecified: Secondary | ICD-10-CM | POA: Diagnosis not present

## 2021-04-25 DIAGNOSIS — I25118 Atherosclerotic heart disease of native coronary artery with other forms of angina pectoris: Secondary | ICD-10-CM | POA: Diagnosis not present

## 2021-04-25 DIAGNOSIS — I10 Essential (primary) hypertension: Secondary | ICD-10-CM | POA: Diagnosis not present

## 2021-04-25 DIAGNOSIS — R011 Cardiac murmur, unspecified: Secondary | ICD-10-CM | POA: Diagnosis not present

## 2021-04-25 DIAGNOSIS — R531 Weakness: Secondary | ICD-10-CM | POA: Diagnosis not present

## 2021-04-25 DIAGNOSIS — I471 Supraventricular tachycardia: Secondary | ICD-10-CM | POA: Diagnosis not present

## 2021-05-01 ENCOUNTER — Ambulatory Visit: Payer: Medicare PPO | Admitting: Internal Medicine

## 2021-05-05 ENCOUNTER — Other Ambulatory Visit
Admission: RE | Admit: 2021-05-05 | Discharge: 2021-05-05 | Disposition: A | Discharge status: Home / Self Care | Payer: Medicare PPO | Source: Ambulatory Visit | Attending: Internal Medicine | Admitting: Internal Medicine

## 2021-05-05 DIAGNOSIS — R002 Palpitations: Secondary | ICD-10-CM | POA: Diagnosis not present

## 2021-05-05 DIAGNOSIS — Z79899 Other long term (current) drug therapy: Secondary | ICD-10-CM | POA: Insufficient documentation

## 2021-05-05 DIAGNOSIS — Z01818 Encounter for other preprocedural examination: Secondary | ICD-10-CM | POA: Diagnosis not present

## 2021-05-05 DIAGNOSIS — I2510 Atherosclerotic heart disease of native coronary artery without angina pectoris: Secondary | ICD-10-CM | POA: Diagnosis not present

## 2021-05-05 LAB — BRAIN NATRIURETIC PEPTIDE: B Natriuretic Peptide: 36.4 pg/mL (ref 0.0–100.0)

## 2021-05-07 ENCOUNTER — Encounter: Payer: Self-pay | Admitting: Internal Medicine

## 2021-05-07 DIAGNOSIS — Z955 Presence of coronary angioplasty implant and graft: Secondary | ICD-10-CM | POA: Diagnosis not present

## 2021-05-07 DIAGNOSIS — E785 Hyperlipidemia, unspecified: Secondary | ICD-10-CM | POA: Diagnosis not present

## 2021-05-07 DIAGNOSIS — I10 Essential (primary) hypertension: Secondary | ICD-10-CM | POA: Diagnosis not present

## 2021-05-07 DIAGNOSIS — Z95820 Peripheral vascular angioplasty status with implants and grafts: Secondary | ICD-10-CM | POA: Diagnosis not present

## 2021-05-07 DIAGNOSIS — I6529 Occlusion and stenosis of unspecified carotid artery: Secondary | ICD-10-CM | POA: Diagnosis not present

## 2021-05-07 DIAGNOSIS — I2510 Atherosclerotic heart disease of native coronary artery without angina pectoris: Secondary | ICD-10-CM | POA: Diagnosis not present

## 2021-05-07 DIAGNOSIS — R9439 Abnormal result of other cardiovascular function study: Secondary | ICD-10-CM | POA: Diagnosis not present

## 2021-05-07 DIAGNOSIS — K5090 Crohn's disease, unspecified, without complications: Secondary | ICD-10-CM | POA: Diagnosis not present

## 2021-05-07 DIAGNOSIS — I25118 Atherosclerotic heart disease of native coronary artery with other forms of angina pectoris: Secondary | ICD-10-CM | POA: Diagnosis not present

## 2021-05-07 HISTORY — PX: CORONARY ANGIOPLASTY WITH STENT PLACEMENT: SHX49

## 2021-05-26 ENCOUNTER — Ambulatory Visit: Payer: Medicare PPO | Admitting: Internal Medicine

## 2021-06-02 ENCOUNTER — Other Ambulatory Visit: Payer: Self-pay

## 2021-06-02 ENCOUNTER — Encounter: Payer: Medicare PPO | Attending: Internal Medicine | Admitting: *Deleted

## 2021-06-02 DIAGNOSIS — Z955 Presence of coronary angioplasty implant and graft: Secondary | ICD-10-CM

## 2021-06-02 NOTE — Progress Notes (Signed)
Initial telephone orientation completed. Diagnosis can be found in [facility] 6/29. EP orientation scheduled for Wednesday 8/3 at 11am.

## 2021-06-04 DIAGNOSIS — Z955 Presence of coronary angioplasty implant and graft: Secondary | ICD-10-CM | POA: Diagnosis not present

## 2021-06-04 DIAGNOSIS — I25118 Atherosclerotic heart disease of native coronary artery with other forms of angina pectoris: Secondary | ICD-10-CM | POA: Diagnosis not present

## 2021-06-04 DIAGNOSIS — I10 Essential (primary) hypertension: Secondary | ICD-10-CM | POA: Diagnosis not present

## 2021-06-04 DIAGNOSIS — R001 Bradycardia, unspecified: Secondary | ICD-10-CM | POA: Diagnosis not present

## 2021-06-04 DIAGNOSIS — I6523 Occlusion and stenosis of bilateral carotid arteries: Secondary | ICD-10-CM | POA: Diagnosis not present

## 2021-06-04 DIAGNOSIS — R600 Localized edema: Secondary | ICD-10-CM | POA: Diagnosis not present

## 2021-06-04 DIAGNOSIS — I471 Supraventricular tachycardia: Secondary | ICD-10-CM | POA: Diagnosis not present

## 2021-06-04 DIAGNOSIS — E782 Mixed hyperlipidemia: Secondary | ICD-10-CM | POA: Diagnosis not present

## 2021-06-04 DIAGNOSIS — I5189 Other ill-defined heart diseases: Secondary | ICD-10-CM | POA: Diagnosis not present

## 2021-06-11 ENCOUNTER — Other Ambulatory Visit: Payer: Self-pay

## 2021-06-11 ENCOUNTER — Encounter: Payer: Medicare PPO | Attending: Internal Medicine

## 2021-06-11 VITALS — Ht 62.0 in | Wt 168.9 lb

## 2021-06-11 DIAGNOSIS — Z955 Presence of coronary angioplasty implant and graft: Secondary | ICD-10-CM | POA: Insufficient documentation

## 2021-06-11 NOTE — Progress Notes (Signed)
Incomplete Session Note  Patient Details  Name: Morgan Moore MRN: [id number] Date of Birth: November 23, 1955 Referring Provider:   Flowsheet Row Cardiac Rehab from 07/06/2016 in [facility]  Referring Provider Lujean Amel MD       [name] did not complete her orientation.  [name] presented to Cardiac Rehab for orientation and had open-toed shoes, unfortunately we were unable to complete her 6MWT and resistance training. All paperwork was completed along with set of resting vitals (BP: 122/72, HR: 70, O2: 96), and program was explained in detail. Patient asked questions and will come back next Tuesday to complete 6MWT.

## 2021-06-17 ENCOUNTER — Other Ambulatory Visit: Payer: Self-pay

## 2021-06-17 VITALS — Ht 62.0 in | Wt 169.6 lb

## 2021-06-17 DIAGNOSIS — Z955 Presence of coronary angioplasty implant and graft: Secondary | ICD-10-CM | POA: Diagnosis not present

## 2021-06-17 NOTE — Patient Instructions (Addendum)
Patient Instructions  Patient Details  Name: Morgan Moore MRN: [id number] Date of Birth: 1956/09/11 Referring Provider:  Yolonda Kida, MD  Below are your personal goals for exercise, nutrition, and risk factors. Our goal is to help you stay on track towards obtaining and maintaining these goals. We will be discussing your progress on these goals with you throughout the program.  Initial Exercise Prescription:  Initial Exercise Prescription - 06/17/21 1100       Date of Initial Exercise RX and Referring Provider   Date 06/17/21    Referring Provider Lujean Amel MD      Treadmill   MPH 2.3    Grade 0.5    Minutes 15    METs 2.92      NuStep   Level 2    SPM 80    Minutes 15    METs 2.7      Biostep-RELP   Level 2    SPM 50    Minutes 15    METs 2      Prescription Details   Frequency (times per week) 2    Duration Progress to 30 minutes of continuous aerobic without signs/symptoms of physical distress      Intensity   THRR 40-80% of Max Heartrate 100-136    Ratings of Perceived Exertion 11-13    Perceived Dyspnea 0-4      Progression   Progression Continue to progress workloads to maintain intensity without signs/symptoms of physical distress.      Resistance Training   Training Prescription Yes    Weight 3 lb    Reps 10-15             Exercise Goals: Frequency: Be able to perform aerobic exercise two to three times per week in program working toward 2-5 days per week of home exercise.  Intensity: Work with a perceived exertion of 11 (fairly light) - 15 (hard) while following your exercise prescription.  We will make changes to your prescription with you as you progress through the program.   Duration: Be able to do 30 to 45 minutes of continuous aerobic exercise in addition to a 5 minute warm-up and a 5 minute cool-down routine.   Nutrition Goals: Your personal nutrition goals will be established when you do your nutrition analysis  with the dietician.  The following are general nutrition guidelines to follow: Cholesterol < 253m/day Sodium < 15064mday Fiber: Women over 50 yrs - 21 grams per day  Personal Goals:  Personal Goals and Risk Factors at Admission - 06/17/21 1119       Core Components/Risk Factors/Patient Goals on Admission    Weight Management Yes;Obesity;Weight Loss    Intervention Weight Management: Develop a combined nutrition and exercise program designed to reach desired caloric intake, while maintaining appropriate intake of nutrient and fiber, sodium and fats, and appropriate energy expenditure required for the weight goal.;Weight Management: Provide education and appropriate resources to help participant work on and attain dietary goals.;Weight Management/Obesity: Establish reasonable short term and long term weight goals.;Obesity: Provide education and appropriate resources to help participant work on and attain dietary goals.    Admit Weight 169 lb 9.6 oz (76.9 kg)    Goal Weight: Short Term 164 lb (74.4 kg)    Goal Weight: Long Term 160 lb (72.6 kg)    Expected Outcomes Short Term: Continue to assess and modify interventions until short term weight is achieved;Long Term: Adherence to nutrition and physical activity/exercise program aimed toward attainment of  established weight goal;Weight Loss: Understanding of general recommendations for a balanced deficit meal plan, which promotes 1-2 lb weight loss per week and includes a negative energy balance of [id number] kcal/d;Understanding recommendations for meals to include 15-35% energy as protein, 25-35% energy from fat, 35-60% energy from carbohydrates, less than 265m of dietary cholesterol, 20-35 gm of total fiber daily;Understanding of distribution of calorie intake throughout the day with the consumption of 4-5 meals/snacks    Hypertension Yes    Intervention Provide education on lifestyle modifcations including regular physical activity/exercise,  weight management, moderate sodium restriction and increased consumption of fresh fruit, vegetables, and low fat dairy, alcohol moderation, and smoking cessation.;Monitor prescription use compliance.    Expected Outcomes Short Term: Continued assessment and intervention until BP is < 140/93mHG in hypertensive participants. < 130/8081mG in hypertensive participants with diabetes, heart failure or chronic kidney disease.;Long Term: Maintenance of blood pressure at goal levels.    Lipids Yes    Intervention Provide education and support for participant on nutrition & aerobic/resistive exercise along with prescribed medications to achieve LDL <32m44mDL >40mg3m Expected Outcomes Short Term: Participant states understanding of desired cholesterol values and is compliant with medications prescribed. Participant is following exercise prescription and nutrition guidelines.;Long Term: Cholesterol controlled with medications as prescribed, with individualized exercise RX and with personalized nutrition plan. Value goals: LDL < 32mg,5m > 40 mg.             Tobacco Use Initial Evaluation: Social History   Tobacco Use  Smoking Status Never  Smokeless Tobacco Never    Exercise Goals and Review:  Exercise Goals     Row Name 06/17/21 1118             Exercise Goals   Increase Physical Activity Yes       Intervention Provide advice, education, support and counseling about physical activity/exercise needs.;Develop an individualized exercise prescription for aerobic and resistive training based on initial evaluation findings, risk stratification, comorbidities and participant's personal goals.       Expected Outcomes Short Term: Attend rehab on a regular basis to increase amount of physical activity.;Long Term: Add in home exercise to make exercise part of routine and to increase amount of physical activity.;Long Term: Exercising regularly at least 3-5 days a week.       Increase Strength and  Stamina Yes       Intervention Provide advice, education, support and counseling about physical activity/exercise needs.;Develop an individualized exercise prescription for aerobic and resistive training based on initial evaluation findings, risk stratification, comorbidities and participant's personal goals.       Expected Outcomes Short Term: Increase workloads from initial exercise prescription for resistance, speed, and METs.;Short Term: Perform resistance training exercises routinely during rehab and add in resistance training at home;Long Term: Improve cardiorespiratory fitness, muscular endurance and strength as measured by increased METs and functional capacity (6MWT)       Able to understand and use rate of perceived exertion (RPE) scale Yes       Intervention Provide education and explanation on how to use RPE scale       Expected Outcomes Short Term: Able to use RPE daily in rehab to express subjective intensity level;Long Term:  Able to use RPE to guide intensity level when exercising independently       Able to understand and use Dyspnea scale Yes       Intervention Provide education and explanation on how to use Dyspnea  scale       Expected Outcomes Short Term: Able to use Dyspnea scale daily in rehab to express subjective sense of shortness of breath during exertion;Long Term: Able to use Dyspnea scale to guide intensity level when exercising independently       Knowledge and understanding of Target Heart Rate Range (THRR) Yes       Intervention Provide education and explanation of THRR including how the numbers were predicted and where they are located for reference       Expected Outcomes Short Term: Able to state/look up THRR;Short Term: Able to use daily as guideline for intensity in rehab;Long Term: Able to use THRR to govern intensity when exercising independently       Able to check pulse independently Yes       Intervention Provide education and demonstration on how to check pulse  in carotid and radial arteries.;Review the importance of being able to check your own pulse for safety during independent exercise       Expected Outcomes Short Term: Able to explain why pulse checking is important during independent exercise;Long Term: Able to check pulse independently and accurately       Understanding of Exercise Prescription Yes       Intervention Provide education, explanation, and written materials on patient's individual exercise prescription       Expected Outcomes Long Term: Able to explain home exercise prescription to exercise independently;Short Term: Able to explain program exercise prescription                Copy of goals given to participant.

## 2021-06-17 NOTE — Progress Notes (Signed)
Daily Session Note  Patient Details  Name: Morgan Moore MRN: [id number] Date of Birth: June 01, 1956 Referring Provider:   Flowsheet Row Cardiac Rehab from 07/06/2016 in [facility]  Referring Provider Lujean Amel MD       Encounter Date: 06/17/2021  Check In:  Session Check In - 06/17/21 1059       Check-In   Supervising physician immediately available to respond to emergencies See telemetry face sheet for immediately available ER MD    Location [facility]    Staff Present Birdie Sons, MPA, RN;Jessica [place], MA, RCEP, CCRP, CCET;Amanda Sommer, BA, ACSM CEP, Exercise Physiologist;Melissa Caiola, RDN, LDN    Virtual Visit No    Medication changes reported     No    Fall or balance concerns reported    No    Warm-up and Cool-down Performed on first and last piece of equipment    Resistance Training Performed Yes    VAD Patient? No    PAD/SET Patient? No      VAD patient   Has back up controller? No      Pain Assessment   Currently in Pain? No/denies                Social History   Tobacco Use  Smoking Status Never  Smokeless Tobacco Never    Goals Met:  Independence with exercise equipment Exercise tolerated well No report of cardiac concerns or symptoms Strength training completed today  Goals Unmet:  Not Applicable  Comments: First full day of exercise!  Patient was oriented to gym and equipment including functions, settings, policies, and procedures.  Patient's individual exercise prescription and treatment plan were reviewed.  All starting workloads were established based on the results of the 6 minute walk test done at initial orientation visit.  The plan for exercise progression was also introduced and progression will be customized based on patient's performance and goals.     Dr. Emily Filbert is Medical Director for [facility].  Dr. Ottie Glazier is Medical Director for  [facility].

## 2021-06-17 NOTE — Progress Notes (Signed)
Cardiac Individual Treatment Plan  Patient Details  Name: Morgan Moore MRN: [id number] Date of Birth: Apr 10, 1956 Referring Provider:   Flowsheet Row Cardiac Rehab from 06/17/2021 in [facility] Cardiac and Pulmonary Rehab  Referring Provider Lujean Amel MD       Initial Encounter Date:  Flowsheet Row Cardiac Rehab from 06/17/2021 in [facility] Cardiac and Pulmonary Rehab  Date 06/17/21       Visit Diagnosis: Status post coronary artery stent placement  Patient's Home Medications on Admission:  Current Outpatient Medications:    aspirin EC 81 MG tablet, Take 81 mg by mouth daily., Disp: , Rfl:    carvedilol (COREG) 3.125 MG tablet, Take 1.5625 mg by mouth 2 (two) times daily with a meal. (Patient not taking: Reported on 06/02/2021), Disp: , Rfl:    Cholecalciferol 25 MCG (1000 UT) tablet, Take 1,000 Units by mouth 2 (two) times daily., Disp: , Rfl:    ciclopirox (PENLAC) 8 % solution, Apply topically at bedtime. Apply over nail and surrounding skin. Apply daily over previous coat. After seven (7) days, may remove with alcohol and continue cycle., Disp: 6.6 mL, Rfl: 1   clopidogrel (PLAVIX) 75 MG tablet, Take 75 mg by mouth daily., Disp: , Rfl:    cyanocobalamin 1000 MCG tablet, Take 1,000 mcg by mouth daily at 12 noon. , Disp: , Rfl:    diphenhydrAMINE (BENADRYL) 12.5 MG/5ML elixir, Take 12.5 mg by mouth every 6 (six) hours as needed (allergic reaction)., Disp: , Rfl:    EPINEPHrine 0.3 mg/0.3 mL IJ SOAJ injection, Inject 0.3 mg into the muscle once. , Disp: , Rfl:    escitalopram (LEXAPRO) 20 MG tablet, TAKE 1 TABLET BY MOUTH ONCE DAILY. (Patient taking differently: Take 20 mg by mouth at bedtime.), Disp: 90 tablet, Rfl: 0   Fe Fum-FePoly-Vit C-Vit B3 (INTEGRA) 62.5-62.5-40-3 MG CAPS, TAKE (1) CAPSULE BY MOUTH ONCE DAILY., Disp: 90 capsule, Rfl: 0   Melatonin-Pyridoxine 3-10 MG TABS, Take 1 tablet by mouth at bedtime., Disp: , Rfl:    mesalamine (LIALDA) 1.2 g EC tablet, Take 1.2 g by  mouth 3 (three) times daily with meals. , Disp: , Rfl:    mupirocin ointment (BACTROBAN) 2 %, Apply to affected area bid (Patient taking differently: Apply 1 application topically 2 (two) times daily as needed (rash in head).), Disp: 22 g, Rfl: 0   nitroGLYCERIN (NITROSTAT) 0.4 MG SL tablet, DISSOLVE (1) TABLET UNDER TONGUE AS NEEDED TO RELIEVE CHEST PAIN. MAYREPEAT EVERY 5 MINUTES., Disp: 25 tablet, Rfl: 0   ondansetron (ZOFRAN-ODT) 4 MG disintegrating tablet, Take 4 mg by mouth daily as needed for nausea. , Disp: , Rfl:    pantoprazole (PROTONIX) 20 MG tablet, Take 20 mg by mouth daily. , Disp: , Rfl:    prednisoLONE acetate (PRED FORTE) 1 % ophthalmic suspension, Place 1 drop into both eyes 2 (two) times daily as needed (for inflammation)., Disp: , Rfl:    rizatriptan (MAXALT) 5 MG tablet, USE AS DIRECTED, Disp: 10 tablet, Rfl: 0   rosuvastatin (CRESTOR) 5 MG tablet, Take 1-2 tablets q day., Disp: 60 tablet, Rfl: 4   verapamil (CALAN-SR) 120 MG CR tablet, Take 120 mg by mouth every evening. , Disp: , Rfl:   Past Medical History: Past Medical History:  Diagnosis Date   Allergy    Anemia    Anxiety    Chicken pox    Complication of anesthesia    vomitting   Coronary artery disease    Crohn disease ([place])  DVT (deep venous thrombosis) ([place]) 2017   Dyspnea    on excertion   Dysrhythmia 2019   palpitations   GERD (gastroesophageal reflux disease)    Heart disease    Heart murmur    Heart murmur    History of kidney stones    Hyperlipidemia    Hypertension    Inflammatory bowel disease    Migraines    hormonal, puberty   Myocardial infarction ([place])    Phlebitis    PONV (postoperative nausea and vomiting)     Tobacco Use: Social History   Tobacco Use  Smoking Status Never  Smokeless Tobacco Never    Labs: Recent Review Flowsheet Data     Labs for ITP Cardiac and Pulmonary Rehab Latest Ref Rng & Units 10/23/2019 03/11/2020 07/16/2020 11/14/2020 03/17/2021   Cholestrol 0 - 200  mg/dL 167 193 180 172 174   LDLCALC 0 - 99 mg/dL 97 114(H) 104(H) 102(H) 101(H)   HDL >39.00 mg/dL 48.00 48.70 45.10 40.80 50.30   Trlycerides 0.0 - 149.0 mg/dL 111.0 153.0(H) 156.0(H) 146.0 117.0        Exercise Target Goals: Exercise Program Goal: Individual exercise prescription set using results from initial 6 min walk test and THRR while considering  patient's activity barriers and safety.   Exercise Prescription Goal: Initial exercise prescription builds to 30-45 minutes a day of aerobic activity, 2-3 days per week.  Home exercise guidelines will be given to patient during program as part of exercise prescription that the participant will acknowledge.   Education: Aerobic Exercise: - Group verbal and visual presentation on the components of exercise prescription. Introduces F.I.T.T principle from ACSM for exercise prescriptions.  Reviews F.I.T.T. principles of aerobic exercise including progression. Written material given at graduation. Flowsheet Row Cardiac Rehab from 09/14/2016 in [facility] Cardiac and Pulmonary Rehab  Date 08/12/16  Educator AS  Instruction Review Code (retired) 2- meets goals/outcomes       Education: Resistance Exercise: - Group verbal and visual presentation on the components of exercise prescription. Introduces F.I.T.T principle from ACSM for exercise prescriptions  Reviews F.I.T.T. principles of resistance exercise including progression. Written material given at graduation.    Education: Exercise & Equipment Safety: - Individual verbal instruction and demonstration of equipment use and safety with use of the equipment. Flowsheet Row Cardiac Rehab from 06/11/2021 in [facility] Cardiac and Pulmonary Rehab  Education need identified 06/11/21  Date 06/11/21  Educator [place]  Instruction Review Code 1- Verbalizes Understanding       Education: Exercise Physiology & General Exercise Guidelines: - Group verbal and written instruction with models to review the  exercise physiology of the cardiovascular system and associated critical values. Provides general exercise guidelines with specific guidelines to those with heart or lung disease.  Flowsheet Row Cardiac Rehab from 09/14/2016 in [facility] Cardiac and Pulmonary Rehab  Date 08/10/16  Educator [facility]  Instruction Review Code (retired) 2- meets goals/outcomes       Education: Flexibility, Insurance underwriter, Mind/Body Relaxation: - Group verbal and visual presentation with interactive activity on the components of exercise prescription. Introduces F.I.T.T principle from ACSM for exercise prescriptions. Reviews F.I.T.T. principles of flexibility and balance exercise training including progression. Also discusses the mind body connection.  Reviews various relaxation techniques to help reduce and manage stress (i.e. Deep breathing, progressive muscle relaxation, and visualization). Balance handout provided to take home. Written material given at graduation. Flowsheet Row Cardiac Rehab from 09/14/2016 in [facility] Cardiac and Pulmonary Rehab  Date 08/17/16  Educator [employer]  Instruction Review Code (retired) 2- meets goals/outcomes       Activity Barriers & Risk Stratification:  Activity Barriers & Cardiac Risk Stratification - 06/11/21 1224       Activity Barriers & Cardiac Risk Stratification   Activity Barriers Muscular Weakness;Deconditioning;Other (comment)    Comments Limited ROM moving arms with flexion    Cardiac Risk Stratification High             6 Minute Walk:  6 Minute Walk     Row Name 06/17/21 1113         6 Minute Walk   Phase Initial     Distance 1275 feet     Walk Time 6 minutes     # of Rest Breaks 0     MPH 2.41     METS 2.79     RPE 11     Perceived Dyspnea  1     VO2 Peak 9.76     Symptoms Yes (comment)     Comments SOB     Resting HR 64 bpm     Resting BP 134/66     Resting Oxygen Saturation  96 %     Exercise Oxygen Saturation  during 6 min walk 96 %     Max Ex. HR 94 bpm      Max Ex. BP 138/74     2 Minute Post BP 118/60              Oxygen Initial Assessment:   Oxygen Re-Evaluation:   Oxygen Discharge (Final Oxygen Re-Evaluation):   Initial Exercise Prescription:  Initial Exercise Prescription - 06/17/21 1100       Date of Initial Exercise RX and Referring Provider   Date 06/17/21    Referring Provider Lujean Amel MD      Treadmill   MPH 2.3    Grade 0.5    Minutes 15    METs 2.92      NuStep   Level 2    SPM 80    Minutes 15    METs 2.7      Biostep-RELP   Level 2    SPM 50    Minutes 15    METs 2      Prescription Details   Frequency (times per week) 2    Duration Progress to 30 minutes of continuous aerobic without signs/symptoms of physical distress      Intensity   THRR 40-80% of Max Heartrate 100-136    Ratings of Perceived Exertion 11-13    Perceived Dyspnea 0-4      Progression   Progression Continue to progress workloads to maintain intensity without signs/symptoms of physical distress.      Resistance Training   Training Prescription Yes    Weight 3 lb    Reps 10-15             Perform Capillary Blood Glucose checks as needed.  Exercise Prescription Changes:   Exercise Prescription Changes     Row Name 06/17/21 1100             Response to Exercise   Blood Pressure (Admit) 134/66       Blood Pressure (Exercise) 138/74       Blood Pressure (Exit) 118/60       Heart Rate (Admit) 64 bpm       Heart Rate (Exercise) 94 bpm       Heart Rate (Exit) 71 bpm  Oxygen Saturation (Admit) 96 %       Oxygen Saturation (Exercise) 96 %       Rating of Perceived Exertion (Exercise) 11       Perceived Dyspnea (Exercise) 1       Symptoms SOB       Comments walk test results                Exercise Comments:   Exercise Comments     Row Name 06/17/21 1101           Exercise Comments First full day of exercise!  Patient was oriented to gym and equipment including functions, settings,  policies, and procedures.  Patient's individual exercise prescription and treatment plan were reviewed.  All starting workloads were established based on the results of the 6 minute walk test done at initial orientation visit.  The plan for exercise progression was also introduced and progression will be customized based on patient's performance and goals.                Exercise Goals and Review:   Exercise Goals     Row Name 06/17/21 1118             Exercise Goals   Increase Physical Activity Yes       Intervention Provide advice, education, support and counseling about physical activity/exercise needs.;Develop an individualized exercise prescription for aerobic and resistive training based on initial evaluation findings, risk stratification, comorbidities and participant's personal goals.       Expected Outcomes Short Term: Attend rehab on a regular basis to increase amount of physical activity.;Long Term: Add in home exercise to make exercise part of routine and to increase amount of physical activity.;Long Term: Exercising regularly at least 3-5 days a week.       Increase Strength and Stamina Yes       Intervention Provide advice, education, support and counseling about physical activity/exercise needs.;Develop an individualized exercise prescription for aerobic and resistive training based on initial evaluation findings, risk stratification, comorbidities and participant's personal goals.       Expected Outcomes Short Term: Increase workloads from initial exercise prescription for resistance, speed, and METs.;Short Term: Perform resistance training exercises routinely during rehab and add in resistance training at home;Long Term: Improve cardiorespiratory fitness, muscular endurance and strength as measured by increased METs and functional capacity (6MWT)       Able to understand and use rate of perceived exertion (RPE) scale Yes       Intervention Provide education and explanation  on how to use RPE scale       Expected Outcomes Short Term: Able to use RPE daily in rehab to express subjective intensity level;Long Term:  Able to use RPE to guide intensity level when exercising independently       Able to understand and use Dyspnea scale Yes       Intervention Provide education and explanation on how to use Dyspnea scale       Expected Outcomes Short Term: Able to use Dyspnea scale daily in rehab to express subjective sense of shortness of breath during exertion;Long Term: Able to use Dyspnea scale to guide intensity level when exercising independently       Knowledge and understanding of Target Heart Rate Range (THRR) Yes       Intervention Provide education and explanation of THRR including how the numbers were predicted and where they are located for reference       Expected  Outcomes Short Term: Able to state/look up THRR;Short Term: Able to use daily as guideline for intensity in rehab;Long Term: Able to use THRR to govern intensity when exercising independently       Able to check pulse independently Yes       Intervention Provide education and demonstration on how to check pulse in carotid and radial arteries.;Review the importance of being able to check your own pulse for safety during independent exercise       Expected Outcomes Short Term: Able to explain why pulse checking is important during independent exercise;Long Term: Able to check pulse independently and accurately       Understanding of Exercise Prescription Yes       Intervention Provide education, explanation, and written materials on patient's individual exercise prescription       Expected Outcomes Long Term: Able to explain home exercise prescription to exercise independently;Short Term: Able to explain program exercise prescription                Exercise Goals Re-Evaluation :   Discharge Exercise Prescription (Final Exercise Prescription Changes):  Exercise Prescription Changes - 06/17/21 1100        Response to Exercise   Blood Pressure (Admit) 134/66    Blood Pressure (Exercise) 138/74    Blood Pressure (Exit) 118/60    Heart Rate (Admit) 64 bpm    Heart Rate (Exercise) 94 bpm    Heart Rate (Exit) 71 bpm    Oxygen Saturation (Admit) 96 %    Oxygen Saturation (Exercise) 96 %    Rating of Perceived Exertion (Exercise) 11    Perceived Dyspnea (Exercise) 1    Symptoms SOB    Comments walk test results             Nutrition:  Target Goals: Understanding of nutrition guidelines, daily intake of sodium <159m, cholesterol <2026m calories 30% from fat and 7% or less from saturated fats, daily to have 5 or more servings of fruits and vegetables.  Education: All About Nutrition: -Group instruction provided by verbal, written material, interactive activities, discussions, models, and posters to present general guidelines for heart healthy nutrition including fat, fiber, MyPlate, the role of sodium in heart healthy nutrition, utilization of the nutrition label, and utilization of this knowledge for meal planning. Follow up email sent as well. Written material given at graduation. [facility]  Date 07/27/16  Educator P. InLeonel RamsayRDNulatoInstruction Review Code (retired) 2- meets goals/outcomes       Biometrics:  Pre Biometrics - 06/17/21 1117       Pre Biometrics   Height 5' 2"  (1.575 m)    Weight 169 lb 9.6 oz (76.9 kg)    BMI (Calculated) 31.01    Single Leg Stand 9.7 seconds              Nutrition Therapy Plan and Nutrition Goals:  Nutrition Therapy & Goals - 06/17/21 1120       Intervention Plan   Intervention Prescribe, educate and counsel regarding individualized specific dietary modifications aiming towards targeted core components such as weight, hypertension, lipid management, diabetes, heart failure and other comorbidities.    Expected Outcomes Short Term Goal: Understand basic  principles of dietary content, such as calories, fat, sodium, cholesterol and nutrients.;Short Term Goal: A plan has been developed with personal nutrition goals set during dietitian appointment.;Long Term Goal: Adherence to prescribed nutrition plan.  Nutrition Assessments:  MEDIFICTS Score Key: ?70 Need to make dietary changes  40-70 Heart Healthy Diet ? 40 Therapeutic Level Cholesterol Diet  Flowsheet Row Cardiac Rehab from 06/11/2021 in [facility] Cardiac and Pulmonary Rehab  Picture Your Plate Total Score on Admission 57      Picture Your Plate Scores: <09 Unhealthy dietary pattern with much room for improvement. 41-50 Dietary pattern unlikely to meet recommendations for good health and room for improvement. 51-60 More healthful dietary pattern, with some room for improvement.  >60 Healthy dietary pattern, although there may be some specific behaviors that could be improved.    Nutrition Goals Re-Evaluation:   Nutrition Goals Discharge (Final Nutrition Goals Re-Evaluation):   Psychosocial: Target Goals: Acknowledge presence or absence of significant depression and/or stress, maximize coping skills, provide positive support system. Participant is able to verbalize types and ability to use techniques and skills needed for reducing stress and depression.   Education: Stress, Anxiety, and Depression - Group verbal and visual presentation to define topics covered.  Reviews how body is impacted by stress, anxiety, and depression.  Also discusses healthy ways to reduce stress and to treat/manage anxiety and depression.  Written material given at graduation. Flowsheet Row Cardiac Rehab from 09/14/2016 in [facility] Cardiac and Pulmonary Rehab  Date 07/29/16  Educator [facility]  Instruction Review Code (retired) 2- meets goals/outcomes       Education: Sleep Hygiene -Provides group verbal and written instruction about how sleep can affect your health.  Define sleep hygiene, discuss  sleep cycles and impact of sleep habits. Review good sleep hygiene tips.    Initial Review & Psychosocial Screening:  Initial Psych Review & Screening - 06/02/21 1427       Initial Review   Current issues with Current Stress Concerns    Source of Stress Concerns Unable to perform yard/household activities      [place]? Yes   son (lives out of town), close by family, friends and church family     Barriers   Psychosocial barriers to participate in program There are no identifiable barriers or psychosocial needs.;The patient should benefit from training in stress management and relaxation.      Screening Interventions   Interventions Encouraged to exercise;Provide feedback about the scores to participant;To provide support and resources with identified psychosocial needs    Expected Outcomes Short Term goal: Utilizing psychosocial counselor, staff and physician to assist with identification of specific Stressors or current issues interfering with healing process. Setting desired goal for each stressor or current issue identified.;Long Term Goal: Stressors or current issues are controlled or eliminated.;Short Term goal: Identification and review with participant of any Quality of Life or Depression concerns found by scoring the questionnaire.;Long Term goal: The participant improves quality of Life and PHQ9 Scores as seen by post scores and/or verbalization of changes             Quality of Life Scores:   Quality of Life - 06/11/21 1233       Quality of Life Scores   Health/Function Pre 25.14 %    Socioeconomic Pre 28.57 %    Psych/Spiritual Pre 25.64 %    Family Pre 26.88 %    GLOBAL Pre 26.22 %            Scores of 19 and below usually indicate a poorer quality of life in these areas.  A difference of  2-3 points is a clinically meaningful difference.  A difference of  2-3 points in the total score of the Quality of Life Index has been associated  with significant improvement in overall quality of life, self-image, physical symptoms, and general health in studies assessing change in quality of life.  PHQ-9: Recent Review Flowsheet Data     Depression screen [facility] 2/9 06/11/2021 04/08/2021 03/19/2021 11/29/2020 07/18/2020   Decreased Interest 0 0 0 0 0   Down, Depressed, Hopeless 0 0 0 0 0   PHQ - 2 Score 0 0 0 0 0   Altered sleeping 0 0 0 0 -   Tired, decreased energy 0 0 1 0 -   Change in appetite 0 0 0 0 -   Feeling bad or failure about yourself  0 0 0 0 -   Trouble concentrating 0 0 0 0 -   Moving slowly or fidgety/restless 0 0 0 0 -   Suicidal thoughts 0 - - 0 -   PHQ-9 Score 0 0 1 0 -   Difficult doing work/chores Not difficult at all Not difficult at all Not difficult at all Not difficult at all -      Interpretation of Total Score  Total Score Depression Severity:  1-4 = Minimal depression, 5-9 = Mild depression, 10-14 = Moderate depression, 15-19 = Moderately severe depression, 20-27 = Severe depression   Psychosocial Evaluation and Intervention:  Psychosocial Evaluation - 06/02/21 1429       Psychosocial Evaluation & Interventions   Interventions Encouraged to exercise with the program and follow exercise prescription    Comments Ms. Mcinerney is returning to cardiac rehab (was here in 2017) after receiving two stents. She states her stamina needs improving and is hoping this program will help with that. She feels confident in her healthy lifestyle management skills, but needs some encouragement and guidance on increasing her stamina. She lives by herself and does all her own housework/yardwork. 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 She does have a son that lives out of town, is surrounded by family and friends and is very involved in her church family. She states she feels blessed to be surrounded by such an amazing support system, especially after dealing with the passing of  her daughter in a car accident years ago and a lot of her family passing due to heart disease.    Expected Outcomes Short: attend cardiac rehab for education and exercise. Long: develop and maintain positive self care habits    Continue Psychosocial Services  Follow up required by staff             Psychosocial Re-Evaluation:   Psychosocial Discharge (Final Psychosocial Re-Evaluation):   Vocational Rehabilitation: Provide vocational rehab assistance to qualifying candidates.   Vocational Rehab Evaluation & Intervention:  Vocational Rehab - 06/02/21 1427       Initial Vocational Rehab Evaluation & Intervention   Assessment shows need for Vocational Rehabilitation No             Education: Education Goals: Education classes will be provided on a variety of topics geared toward better understanding of heart health and risk factor modification. Participant will state understanding/return demonstration of topics presented as noted by education test scores.  Learning Barriers/Preferences:  Learning Barriers/Preferences - 06/02/21 1426       Learning Barriers/Preferences   Learning Barriers None    Learning Preferences None             General Cardiac Education Topics:  AED/CPR: -  Group verbal and written instruction with the use of models to demonstrate the basic use of the AED with the basic ABC's of resuscitation.   Anatomy and Cardiac Procedures: - Group verbal and visual presentation and models provide information about basic cardiac anatomy and function. Reviews the testing methods done to diagnose heart disease and the outcomes of the test results. Describes the treatment choices: Medical Management, Angioplasty, or Coronary Bypass Surgery for treating various heart conditions including Myocardial Infarction, Angina, Valve Disease, and Cardiac Arrhythmias.  Written material given at graduation. Flowsheet Row Cardiac Rehab from 09/14/2016 in [facility] Cardiac and  Pulmonary Rehab  Date 08/24/16  Educator CE  Instruction Review Code (retired) 2- meets goals/outcomes       Medication Safety: - Group verbal and visual instruction to review commonly prescribed medications for heart and lung disease. Reviews the medication, class of the drug, and side effects. Includes the steps to properly store meds and maintain the prescription regimen.  Written material given at graduation. Flowsheet Row Cardiac Rehab from 09/14/2016 in [facility] Cardiac and Pulmonary Rehab  Date 09/07/16  Educator SB  Instruction Review Code (retired) 2- meets goals/outcomes       Intimacy: - Group verbal instruction through game format to discuss how heart and lung disease can affect sexual intimacy. Written material given at graduation.. Flowsheet Row Cardiac Rehab from 09/14/2016 in [facility] Cardiac and Pulmonary Rehab  Date 09/14/16  Educator Foster Simpson  Instruction Review Code (retired) 2- meets goals/outcomes       Know Your Numbers and Heart Failure: - Group verbal and visual instruction to discuss disease risk factors for cardiac and pulmonary disease and treatment options.  Reviews associated critical values for Overweight/Obesity, Hypertension, Cholesterol, and Diabetes.  Discusses basics of heart failure: signs/symptoms and treatments.  Introduces Heart Failure Zone chart for action plan for heart failure.  Written material given at graduation. Flowsheet Row Cardiac Rehab from 09/14/2016 in [facility] Cardiac and Pulmonary Rehab  Date 08/24/16  Educator CE  Instruction Review Code (retired) 2- meets goals/outcomes       Infection Prevention: - Provides verbal and written material to individual with discussion of infection control including proper hand washing and proper equipment cleaning during exercise session. Flowsheet Row Cardiac Rehab from 06/11/2021 in [facility] Cardiac and Pulmonary Rehab  Education need identified 06/11/21  Date 06/11/21  Educator [place]  Instruction Review  Code 1- Verbalizes Understanding       Falls Prevention: - Provides verbal and written material to individual with discussion of falls prevention and safety. Flowsheet Row Cardiac Rehab from 06/11/2021 in [facility] Cardiac and Pulmonary Rehab  Education need identified 06/11/21  Date 06/11/21  Educator [place]  Instruction Review Code 1- Verbalizes Understanding       Other: -Provides group and verbal instruction on various topics (see comments)   Knowledge Questionnaire Score:  Knowledge Questionnaire Score - 06/11/21 1232       Knowledge Questionnaire Score   Pre Score 25/26: Exercise             Core Components/Risk Factors/Patient Goals at Admission:  Personal Goals and Risk Factors at Admission - 06/17/21 1119       Core Components/Risk Factors/Patient Goals on Admission    Weight Management Yes;Obesity;Weight Loss    Intervention Weight Management: Develop a combined nutrition and exercise program designed to reach desired caloric intake, while maintaining appropriate intake of nutrient and fiber, sodium and fats, and appropriate energy expenditure required for the weight goal.;Weight Management: Provide education  and appropriate resources to help participant work on and attain dietary goals.;Weight Management/Obesity: Establish reasonable short term and long term weight goals.;Obesity: Provide education and appropriate resources to help participant work on and attain dietary goals.    Admit Weight 169 lb 9.6 oz (76.9 kg)    Goal Weight: Short Term 164 lb (74.4 kg)    Goal Weight: Long Term 160 lb (72.6 kg)    Expected Outcomes Short Term: Continue to assess and modify interventions until short term weight is achieved;Long Term: Adherence to nutrition and physical activity/exercise program aimed toward attainment of established weight goal;Weight Loss: Understanding of general recommendations for a balanced deficit meal plan, which promotes 1-2 lb weight loss per week and  includes a negative energy balance of [phone] kcal/d;Understanding recommendations for meals to include 15-35% energy as protein, 25-35% energy from fat, 35-60% energy from carbohydrates, less than 232m of dietary cholesterol, 20-35 gm of total fiber daily;Understanding of distribution of calorie intake throughout the day with the consumption of 4-5 meals/snacks    Hypertension Yes    Intervention Provide education on lifestyle modifcations including regular physical activity/exercise, weight management, moderate sodium restriction and increased consumption of fresh fruit, vegetables, and low fat dairy, alcohol moderation, and smoking cessation.;Monitor prescription use compliance.    Expected Outcomes Short Term: Continued assessment and intervention until BP is < 140/937mHG in hypertensive participants. < 130/8058mG in hypertensive participants with diabetes, heart failure or chronic kidney disease.;Long Term: Maintenance of blood pressure at goal levels.    Lipids Yes    Intervention Provide education and support for participant on nutrition & aerobic/resistive exercise along with prescribed medications to achieve LDL <35m43mDL >40mg8m Expected Outcomes Short Term: Participant states understanding of desired cholesterol values and is compliant with medications prescribed. Participant is following exercise prescription and nutrition guidelines.;Long Term: Cholesterol controlled with medications as prescribed, with individualized exercise RX and with personalized nutrition plan. Value goals: LDL < 35mg,28m > 40 mg.             Education:Diabetes - Individual verbal and written instruction to review signs/symptoms of diabetes, desired ranges of glucose level fasting, after meals and with exercise. Acknowledge that pre and post exercise glucose checks will be done for 3 sessions at entry of program.   Core Components/Risk Factors/Patient Goals Review:    Core Components/Risk  Factors/Patient Goals at Discharge (Final Review):    ITP Comments:  ITP Comments     Row Name 06/02/21 1402 06/11/21 1213 06/17/21 1101       ITP Comments Initial telephone orientation completed. Diagnosis can be found in [facility] EP orientation scheduled for Wednesday 8/3 at 11am. [place]hab for gym orientation- 6MWT was not completed as patient had open-toes shoes. Will complete 6MWT next week on 8/9. First full day of exercise!  Patient was oriented to gym and equipment including functions, settings, policies, and procedures.  Patient's individual exercise prescription and treatment plan were reviewed.  All starting workloads were established based on the results of the 6 minute walk test done at initial orientation visit.  The plan for exercise progression was also introduced and progression will be customized based on patient's performance and goals.              Comments: Initial ITP

## 2021-06-18 ENCOUNTER — Encounter: Payer: Self-pay | Admitting: *Deleted

## 2021-06-18 DIAGNOSIS — R1314 Dysphagia, pharyngoesophageal phase: Secondary | ICD-10-CM | POA: Diagnosis not present

## 2021-06-18 DIAGNOSIS — D508 Other iron deficiency anemias: Secondary | ICD-10-CM | POA: Diagnosis not present

## 2021-06-18 DIAGNOSIS — K219 Gastro-esophageal reflux disease without esophagitis: Secondary | ICD-10-CM | POA: Diagnosis not present

## 2021-06-18 DIAGNOSIS — Z955 Presence of coronary angioplasty implant and graft: Secondary | ICD-10-CM

## 2021-06-18 DIAGNOSIS — K5010 Crohn's disease of large intestine without complications: Secondary | ICD-10-CM | POA: Diagnosis not present

## 2021-06-18 NOTE — Progress Notes (Signed)
Cardiac Individual Treatment Plan  Patient Details  Name: Morgan Moore MRN: [id number] Date of Birth: 08/17/1956 Referring Provider:   Flowsheet Row Cardiac Rehab from 06/17/2021 in [facility] Cardiac and Pulmonary Rehab  Referring Provider Lujean Amel MD       Initial Encounter Date:  Flowsheet Row Cardiac Rehab from 06/17/2021 in [facility] Cardiac and Pulmonary Rehab  Date 06/17/21       Visit Diagnosis: Status post coronary artery stent placement  Patient's Home Medications on Admission:  Current Outpatient Medications:    aspirin EC 81 MG tablet, Take 81 mg by mouth daily., Disp: , Rfl:    carvedilol (COREG) 3.125 MG tablet, Take 1.5625 mg by mouth 2 (two) times daily with a meal. (Patient not taking: Reported on 06/02/2021), Disp: , Rfl:    Cholecalciferol 25 MCG (1000 UT) tablet, Take 1,000 Units by mouth 2 (two) times daily., Disp: , Rfl:    ciclopirox (PENLAC) 8 % solution, Apply topically at bedtime. Apply over nail and surrounding skin. Apply daily over previous coat. After seven (7) days, may remove with alcohol and continue cycle., Disp: 6.6 mL, Rfl: 1   clopidogrel (PLAVIX) 75 MG tablet, Take 75 mg by mouth daily., Disp: , Rfl:    cyanocobalamin 1000 MCG tablet, Take 1,000 mcg by mouth daily at 12 noon. , Disp: , Rfl:    diphenhydrAMINE (BENADRYL) 12.5 MG/5ML elixir, Take 12.5 mg by mouth every 6 (six) hours as needed (allergic reaction)., Disp: , Rfl:    EPINEPHrine 0.3 mg/0.3 mL IJ SOAJ injection, Inject 0.3 mg into the muscle once. , Disp: , Rfl:    escitalopram (LEXAPRO) 20 MG tablet, TAKE 1 TABLET BY MOUTH ONCE DAILY. (Patient taking differently: Take 20 mg by mouth at bedtime.), Disp: 90 tablet, Rfl: 0   Fe Fum-FePoly-Vit C-Vit B3 (INTEGRA) 62.5-62.5-40-3 MG CAPS, TAKE (1) CAPSULE BY MOUTH ONCE DAILY., Disp: 90 capsule, Rfl: 0   Melatonin-Pyridoxine 3-10 MG TABS, Take 1 tablet by mouth at bedtime., Disp: , Rfl:    mesalamine (LIALDA) 1.2 g EC tablet, Take 1.2 g by  mouth 3 (three) times daily with meals. , Disp: , Rfl:    mupirocin ointment (BACTROBAN) 2 %, Apply to affected area bid (Patient taking differently: Apply 1 application topically 2 (two) times daily as needed (rash in head).), Disp: 22 g, Rfl: 0   nitroGLYCERIN (NITROSTAT) 0.4 MG SL tablet, DISSOLVE (1) TABLET UNDER TONGUE AS NEEDED TO RELIEVE CHEST PAIN. MAYREPEAT EVERY 5 MINUTES., Disp: 25 tablet, Rfl: 0   ondansetron (ZOFRAN-ODT) 4 MG disintegrating tablet, Take 4 mg by mouth daily as needed for nausea. , Disp: , Rfl:    pantoprazole (PROTONIX) 20 MG tablet, Take 20 mg by mouth daily. , Disp: , Rfl:    prednisoLONE acetate (PRED FORTE) 1 % ophthalmic suspension, Place 1 drop into both eyes 2 (two) times daily as needed (for inflammation)., Disp: , Rfl:    rizatriptan (MAXALT) 5 MG tablet, USE AS DIRECTED, Disp: 10 tablet, Rfl: 0   rosuvastatin (CRESTOR) 5 MG tablet, Take 1-2 tablets q day., Disp: 60 tablet, Rfl: 4   verapamil (CALAN-SR) 120 MG CR tablet, Take 120 mg by mouth every evening. , Disp: , Rfl:   Past Medical History: Past Medical History:  Diagnosis Date   Allergy    Anemia    Anxiety    Chicken pox    Complication of anesthesia    vomitting   Coronary artery disease    Crohn disease ([place])  DVT (deep venous thrombosis) ([place]) 2017   Dyspnea    on excertion   Dysrhythmia 2019   palpitations   GERD (gastroesophageal reflux disease)    Heart disease    Heart murmur    Heart murmur    History of kidney stones    Hyperlipidemia    Hypertension    Inflammatory bowel disease    Migraines    hormonal, puberty   Myocardial infarction ([place])    Phlebitis    PONV (postoperative nausea and vomiting)     Tobacco Use: Social History   Tobacco Use  Smoking Status Never  Smokeless Tobacco Never    Labs: Recent Review Flowsheet Data     Labs for ITP Cardiac and Pulmonary Rehab Latest Ref Rng & Units 10/23/2019 03/11/2020 07/16/2020 11/14/2020 03/17/2021   Cholestrol 0 - 200  mg/dL 167 193 180 172 174   LDLCALC 0 - 99 mg/dL 97 114(H) 104(H) 102(H) 101(H)   HDL >39.00 mg/dL 48.00 48.70 45.10 40.80 50.30   Trlycerides 0.0 - 149.0 mg/dL 111.0 153.0(H) 156.0(H) 146.0 117.0        Exercise Target Goals: Exercise Program Goal: Individual exercise prescription set using results from initial 6 min walk test and THRR while considering  patient's activity barriers and safety.   Exercise Prescription Goal: Initial exercise prescription builds to 30-45 minutes a day of aerobic activity, 2-3 days per week.  Home exercise guidelines will be given to patient during program as part of exercise prescription that the participant will acknowledge.   Education: Aerobic Exercise: - Group verbal and visual presentation on the components of exercise prescription. Introduces F.I.T.T principle from ACSM for exercise prescriptions.  Reviews F.I.T.T. principles of aerobic exercise including progression. Written material given at graduation. Flowsheet Row Cardiac Rehab from 09/14/2016 in [facility] Cardiac and Pulmonary Rehab  Date 08/12/16  Educator AS  Instruction Review Code (retired) 2- meets goals/outcomes       Education: Resistance Exercise: - Group verbal and visual presentation on the components of exercise prescription. Introduces F.I.T.T principle from ACSM for exercise prescriptions  Reviews F.I.T.T. principles of resistance exercise including progression. Written material given at graduation.    Education: Exercise & Equipment Safety: - Individual verbal instruction and demonstration of equipment use and safety with use of the equipment. Flowsheet Row Cardiac Rehab from 06/11/2021 in [facility] Cardiac and Pulmonary Rehab  Education need identified 06/11/21  Date 06/11/21  Educator [place]  Instruction Review Code 1- Verbalizes Understanding       Education: Exercise Physiology & General Exercise Guidelines: - Group verbal and written instruction with models to review the  exercise physiology of the cardiovascular system and associated critical values. Provides general exercise guidelines with specific guidelines to those with heart or lung disease.  Flowsheet Row Cardiac Rehab from 09/14/2016 in [facility] Cardiac and Pulmonary Rehab  Date 08/10/16  Educator [facility]  Instruction Review Code (retired) 2- meets goals/outcomes       Education: Flexibility, Insurance underwriter, Mind/Body Relaxation: - Group verbal and visual presentation with interactive activity on the components of exercise prescription. Introduces F.I.T.T principle from ACSM for exercise prescriptions. Reviews F.I.T.T. principles of flexibility and balance exercise training including progression. Also discusses the mind body connection.  Reviews various relaxation techniques to help reduce and manage stress (i.e. Deep breathing, progressive muscle relaxation, and visualization). Balance handout provided to take home. Written material given at graduation. Flowsheet Row Cardiac Rehab from 09/14/2016 in [facility] Cardiac and Pulmonary Rehab  Date 08/17/16  Educator [employer]  Instruction Review Code (retired) 2- meets goals/outcomes       Activity Barriers & Risk Stratification:  Activity Barriers & Cardiac Risk Stratification - 06/11/21 1224       Activity Barriers & Cardiac Risk Stratification   Activity Barriers Muscular Weakness;Deconditioning;Other (comment)    Comments Limited ROM moving arms with flexion    Cardiac Risk Stratification High             6 Minute Walk:  6 Minute Walk     Row Name 06/17/21 1113         6 Minute Walk   Phase Initial     Distance 1275 feet     Walk Time 6 minutes     # of Rest Breaks 0     MPH 2.41     METS 2.79     RPE 11     Perceived Dyspnea  1     VO2 Peak 9.76     Symptoms Yes (comment)     Comments SOB     Resting HR 64 bpm     Resting BP 134/66     Resting Oxygen Saturation  96 %     Exercise Oxygen Saturation  during 6 min walk 96 %     Max Ex. HR 94 bpm      Max Ex. BP 138/74     2 Minute Post BP 118/60              Oxygen Initial Assessment:   Oxygen Re-Evaluation:   Oxygen Discharge (Final Oxygen Re-Evaluation):   Initial Exercise Prescription:  Initial Exercise Prescription - 06/17/21 1100       Date of Initial Exercise RX and Referring Provider   Date 06/17/21    Referring Provider Lujean Amel MD      Treadmill   MPH 2.3    Grade 0.5    Minutes 15    METs 2.92      NuStep   Level 2    SPM 80    Minutes 15    METs 2.7      Biostep-RELP   Level 2    SPM 50    Minutes 15    METs 2      Prescription Details   Frequency (times per week) 2    Duration Progress to 30 minutes of continuous aerobic without signs/symptoms of physical distress      Intensity   THRR 40-80% of Max Heartrate 100-136    Ratings of Perceived Exertion 11-13    Perceived Dyspnea 0-4      Progression   Progression Continue to progress workloads to maintain intensity without signs/symptoms of physical distress.      Resistance Training   Training Prescription Yes    Weight 3 lb    Reps 10-15             Perform Capillary Blood Glucose checks as needed.  Exercise Prescription Changes:   Exercise Prescription Changes     Row Name 06/17/21 1100             Response to Exercise   Blood Pressure (Admit) 134/66       Blood Pressure (Exercise) 138/74       Blood Pressure (Exit) 118/60       Heart Rate (Admit) 64 bpm       Heart Rate (Exercise) 94 bpm       Heart Rate (Exit) 71 bpm  Oxygen Saturation (Admit) 96 %       Oxygen Saturation (Exercise) 96 %       Rating of Perceived Exertion (Exercise) 11       Perceived Dyspnea (Exercise) 1       Symptoms SOB       Comments walk test results                Exercise Comments:   Exercise Comments     Row Name 06/17/21 1101           Exercise Comments First full day of exercise!  Patient was oriented to gym and equipment including functions, settings,  policies, and procedures.  Patient's individual exercise prescription and treatment plan were reviewed.  All starting workloads were established based on the results of the 6 minute walk test done at initial orientation visit.  The plan for exercise progression was also introduced and progression will be customized based on patient's performance and goals.                Exercise Goals and Review:   Exercise Goals     Row Name 06/17/21 1118             Exercise Goals   Increase Physical Activity Yes       Intervention Provide advice, education, support and counseling about physical activity/exercise needs.;Develop an individualized exercise prescription for aerobic and resistive training based on initial evaluation findings, risk stratification, comorbidities and participant's personal goals.       Expected Outcomes Short Term: Attend rehab on a regular basis to increase amount of physical activity.;Long Term: Add in home exercise to make exercise part of routine and to increase amount of physical activity.;Long Term: Exercising regularly at least 3-5 days a week.       Increase Strength and Stamina Yes       Intervention Provide advice, education, support and counseling about physical activity/exercise needs.;Develop an individualized exercise prescription for aerobic and resistive training based on initial evaluation findings, risk stratification, comorbidities and participant's personal goals.       Expected Outcomes Short Term: Increase workloads from initial exercise prescription for resistance, speed, and METs.;Short Term: Perform resistance training exercises routinely during rehab and add in resistance training at home;Long Term: Improve cardiorespiratory fitness, muscular endurance and strength as measured by increased METs and functional capacity (6MWT)       Able to understand and use rate of perceived exertion (RPE) scale Yes       Intervention Provide education and explanation  on how to use RPE scale       Expected Outcomes Short Term: Able to use RPE daily in rehab to express subjective intensity level;Long Term:  Able to use RPE to guide intensity level when exercising independently       Able to understand and use Dyspnea scale Yes       Intervention Provide education and explanation on how to use Dyspnea scale       Expected Outcomes Short Term: Able to use Dyspnea scale daily in rehab to express subjective sense of shortness of breath during exertion;Long Term: Able to use Dyspnea scale to guide intensity level when exercising independently       Knowledge and understanding of Target Heart Rate Range (THRR) Yes       Intervention Provide education and explanation of THRR including how the numbers were predicted and where they are located for reference       Expected  Outcomes Short Term: Able to state/look up THRR;Short Term: Able to use daily as guideline for intensity in rehab;Long Term: Able to use THRR to govern intensity when exercising independently       Able to check pulse independently Yes       Intervention Provide education and demonstration on how to check pulse in carotid and radial arteries.;Review the importance of being able to check your own pulse for safety during independent exercise       Expected Outcomes Short Term: Able to explain why pulse checking is important during independent exercise;Long Term: Able to check pulse independently and accurately       Understanding of Exercise Prescription Yes       Intervention Provide education, explanation, and written materials on patient's individual exercise prescription       Expected Outcomes Long Term: Able to explain home exercise prescription to exercise independently;Short Term: Able to explain program exercise prescription                Exercise Goals Re-Evaluation :  Exercise Goals Re-Evaluation     [place] Name 06/17/21 1141             Exercise Goal Re-Evaluation   Exercise Goals  Review Able to understand and use rate of perceived exertion (RPE) scale;Increase Physical Activity;Knowledge and understanding of Target Heart Rate Range (THRR);Understanding of Exercise Prescription;Increase Strength and Stamina;Able to understand and use Dyspnea scale;Able to check pulse independently       Comments Reviewed RPE and dyspnea scales, THR and program prescription with pt today.  Pt voiced understanding and was given a copy of goals to take home.       Expected Outcomes Short: Use RPE daily to regulate intensity. Long: Follow program prescription in THR.                Discharge Exercise Prescription (Final Exercise Prescription Changes):  Exercise Prescription Changes - 06/17/21 1100       Response to Exercise   Blood Pressure (Admit) 134/66    Blood Pressure (Exercise) 138/74    Blood Pressure (Exit) 118/60    Heart Rate (Admit) 64 bpm    Heart Rate (Exercise) 94 bpm    Heart Rate (Exit) 71 bpm    Oxygen Saturation (Admit) 96 %    Oxygen Saturation (Exercise) 96 %    Rating of Perceived Exertion (Exercise) 11    Perceived Dyspnea (Exercise) 1    Symptoms SOB    Comments walk test results             Nutrition:  Target Goals: Understanding of nutrition guidelines, daily intake of sodium <1538m, cholesterol <2067m calories 30% from fat and 7% or less from saturated fats, daily to have 5 or more servings of fruits and vegetables.  Education: All About Nutrition: -Group instruction provided by verbal, written material, interactive activities, discussions, models, and posters to present general guidelines for heart healthy nutrition including fat, fiber, MyPlate, the role of sodium in heart healthy nutrition, utilization of the nutrition label, and utilization of this knowledge for meal planning. Follow up email sent as well. Written material given at graduation. [facility]  Date 07/27/16   Educator P. InLeonel RamsayRDAshlandInstruction Review Code (retired) 2- meets goals/outcomes       Biometrics:  Pre Biometrics - 06/17/21 1117       Pre Biometrics   Height 5' 2"  (1.575 m)  Weight 169 lb 9.6 oz (76.9 kg)    BMI (Calculated) 31.01    Single Leg Stand 9.7 seconds              Nutrition Therapy Plan and Nutrition Goals:  Nutrition Therapy & Goals - 06/17/21 1120       Intervention Plan   Intervention Prescribe, educate and counsel regarding individualized specific dietary modifications aiming towards targeted core components such as weight, hypertension, lipid management, diabetes, heart failure and other comorbidities.    Expected Outcomes Short Term Goal: Understand basic principles of dietary content, such as calories, fat, sodium, cholesterol and nutrients.;Short Term Goal: A plan has been developed with personal nutrition goals set during dietitian appointment.;Long Term Goal: Adherence to prescribed nutrition plan.             Nutrition Assessments:  MEDIFICTS Score Key: ?70 Need to make dietary changes  40-70 Heart Healthy Diet ? 40 Therapeutic Level Cholesterol Diet  Flowsheet Row Cardiac Rehab from 06/11/2021 in [facility] Cardiac and Pulmonary Rehab  Picture Your Plate Total Score on Admission 57      Picture Your Plate Scores: <38 Unhealthy dietary pattern with much room for improvement. 41-50 Dietary pattern unlikely to meet recommendations for good health and room for improvement. 51-60 More healthful dietary pattern, with some room for improvement.  >60 Healthy dietary pattern, although there may be some specific behaviors that could be improved.    Nutrition Goals Re-Evaluation:   Nutrition Goals Discharge (Final Nutrition Goals Re-Evaluation):   Psychosocial: Target Goals: Acknowledge presence or absence of significant depression and/or stress, maximize coping skills, provide positive support system. Participant is able to verbalize  types and ability to use techniques and skills needed for reducing stress and depression.   Education: Stress, Anxiety, and Depression - Group verbal and visual presentation to define topics covered.  Reviews how body is impacted by stress, anxiety, and depression.  Also discusses healthy ways to reduce stress and to treat/manage anxiety and depression.  Written material given at graduation. Flowsheet Row Cardiac Rehab from 09/14/2016 in [facility] Cardiac and Pulmonary Rehab  Date 07/29/16  Educator [facility]  Instruction Review Code (retired) 2- meets goals/outcomes       Education: Sleep Hygiene -Provides group verbal and written instruction about how sleep can affect your health.  Define sleep hygiene, discuss sleep cycles and impact of sleep habits. Review good sleep hygiene tips.    Initial Review & Psychosocial Screening:  Initial Psych Review & Screening - 06/02/21 1427       Initial Review   Current issues with Current Stress Concerns    Source of Stress Concerns Unable to perform yard/household activities      [place]? Yes   son (lives out of town), close by family, friends and church family     Barriers   Psychosocial barriers to participate in program There are no identifiable barriers or psychosocial needs.;The patient should benefit from training in stress management and relaxation.      Screening Interventions   Interventions Encouraged to exercise;Provide feedback about the scores to participant;To provide support and resources with identified psychosocial needs    Expected Outcomes Short Term goal: Utilizing psychosocial counselor, staff and physician to assist with identification of specific Stressors or current issues interfering with healing process. Setting desired goal for each stressor or current issue identified.;Long Term Goal: Stressors or current issues are controlled or eliminated.;Short Term goal: Identification and review with participant  of  any Quality of Life or Depression concerns found by scoring the questionnaire.;Long Term goal: The participant improves quality of Life and PHQ9 Scores as seen by post scores and/or verbalization of changes             Quality of Life Scores:   Quality of Life - 06/11/21 1233       Quality of Life Scores   Health/Function Pre 25.14 %    Socioeconomic Pre 28.57 %    Psych/Spiritual Pre 25.64 %    Family Pre 26.88 %    GLOBAL Pre 26.22 %            Scores of 19 and below usually indicate a poorer quality of life in these areas.  A difference of  2-3 points is a clinically meaningful difference.  A difference of 2-3 points in the total score of the Quality of Life Index has been associated with significant improvement in overall quality of life, self-image, physical symptoms, and general health in studies assessing change in quality of life.  PHQ-9: Recent Review Flowsheet Data     Depression screen [facility] 2/9 06/11/2021 04/08/2021 03/19/2021 11/29/2020 07/18/2020   Decreased Interest 0 0 0 0 0   Down, Depressed, Hopeless 0 0 0 0 0   PHQ - 2 Score 0 0 0 0 0   Altered sleeping 0 0 0 0 -   Tired, decreased energy 0 0 1 0 -   Change in appetite 0 0 0 0 -   Feeling bad or failure about yourself  0 0 0 0 -   Trouble concentrating 0 0 0 0 -   Moving slowly or fidgety/restless 0 0 0 0 -   Suicidal thoughts 0 - - 0 -   PHQ-9 Score 0 0 1 0 -   Difficult doing work/chores Not difficult at all Not difficult at all Not difficult at all Not difficult at all -      Interpretation of Total Score  Total Score Depression Severity:  1-4 = Minimal depression, 5-9 = Mild depression, 10-14 = Moderate depression, 15-19 = Moderately severe depression, 20-27 = Severe depression   Psychosocial Evaluation and Intervention:  Psychosocial Evaluation - 06/02/21 1429       Psychosocial Evaluation & Interventions   Interventions Encouraged to exercise with the program and follow exercise prescription     Comments Ms. Watt is returning to cardiac rehab (was here in 2017) after receiving two stents. She states her stamina needs improving and is hoping this program will help with that. She feels confident in her healthy lifestyle management skills, but needs some encouragement and guidance on increasing her stamina. She lives by herself and does all her own housework/yardwork. 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 She does have a son that lives out of town, is surrounded by family and friends and is very involved in her church family. She states she feels blessed to be surrounded by such an amazing support system, especially after dealing with the passing of her daughter in a car accident years ago and a lot of her family passing due to heart disease.    Expected Outcomes Short: attend cardiac rehab for education and exercise. Long: develop and maintain positive self care habits    Continue Psychosocial Services  Follow up required by staff             Psychosocial Re-Evaluation:   Psychosocial Discharge (Final Psychosocial Re-Evaluation):  Vocational Rehabilitation: Provide vocational rehab assistance to qualifying candidates.   Vocational Rehab Evaluation & Intervention:  Vocational Rehab - 06/02/21 1427       Initial Vocational Rehab Evaluation & Intervention   Assessment shows need for Vocational Rehabilitation No             Education: Education Goals: Education classes will be provided on a variety of topics geared toward better understanding of heart health and risk factor modification. Participant will state understanding/return demonstration of topics presented as noted by education test scores.  Learning Barriers/Preferences:  Learning Barriers/Preferences - 06/02/21 1426       Learning Barriers/Preferences   Learning Barriers None    Learning Preferences None             General Cardiac Education  Topics:  AED/CPR: - Group verbal and written instruction with the use of models to demonstrate the basic use of the AED with the basic ABC's of resuscitation.   Anatomy and Cardiac Procedures: - Group verbal and visual presentation and models provide information about basic cardiac anatomy and function. Reviews the testing methods done to diagnose heart disease and the outcomes of the test results. Describes the treatment choices: Medical Management, Angioplasty, or Coronary Bypass Surgery for treating various heart conditions including Myocardial Infarction, Angina, Valve Disease, and Cardiac Arrhythmias.  Written material given at graduation. Flowsheet Row Cardiac Rehab from 09/14/2016 in [facility] Cardiac and Pulmonary Rehab  Date 08/24/16  Educator CE  Instruction Review Code (retired) 2- meets goals/outcomes       Medication Safety: - Group verbal and visual instruction to review commonly prescribed medications for heart and lung disease. Reviews the medication, class of the drug, and side effects. Includes the steps to properly store meds and maintain the prescription regimen.  Written material given at graduation. Flowsheet Row Cardiac Rehab from 09/14/2016 in [facility] Cardiac and Pulmonary Rehab  Date 09/07/16  Educator SB  Instruction Review Code (retired) 2- meets goals/outcomes       Intimacy: - Group verbal instruction through game format to discuss how heart and lung disease can affect sexual intimacy. Written material given at graduation.. Flowsheet Row Cardiac Rehab from 09/14/2016 in [facility] Cardiac and Pulmonary Rehab  Date 09/14/16  Educator Foster Simpson  Instruction Review Code (retired) 2- meets goals/outcomes       Know Your Numbers and Heart Failure: - Group verbal and visual instruction to discuss disease risk factors for cardiac and pulmonary disease and treatment options.  Reviews associated critical values for Overweight/Obesity, Hypertension, Cholesterol, and Diabetes.   Discusses basics of heart failure: signs/symptoms and treatments.  Introduces Heart Failure Zone chart for action plan for heart failure.  Written material given at graduation. Flowsheet Row Cardiac Rehab from 09/14/2016 in [facility] Cardiac and Pulmonary Rehab  Date 08/24/16  Educator CE  Instruction Review Code (retired) 2- meets goals/outcomes       Infection Prevention: - Provides verbal and written material to individual with discussion of infection control including proper hand washing and proper equipment cleaning during exercise session. Flowsheet Row Cardiac Rehab from 06/11/2021 in [facility] Cardiac and Pulmonary Rehab  Education need identified 06/11/21  Date 06/11/21  Educator [place]  Instruction Review Code 1- Verbalizes Understanding       Falls Prevention: - Provides verbal and written material to individual with discussion of falls prevention and safety. Flowsheet Row Cardiac Rehab from 06/11/2021 in [facility] Cardiac and Pulmonary Rehab  Education need identified 06/11/21  Date 06/11/21  Educator Craig Staggers  Instruction Review Code 1- Verbalizes Understanding       Other: -Provides group and verbal instruction on various topics (see comments)   Knowledge Questionnaire Score:  Knowledge Questionnaire Score - 06/11/21 1232       Knowledge Questionnaire Score   Pre Score 25/26: Exercise             Core Components/Risk Factors/Patient Goals at Admission:  Personal Goals and Risk Factors at Admission - 06/17/21 1119       Core Components/Risk Factors/Patient Goals on Admission    Weight Management Yes;Obesity;Weight Loss    Intervention Weight Management: Develop a combined nutrition and exercise program designed to reach desired caloric intake, while maintaining appropriate intake of nutrient and fiber, sodium and fats, and appropriate energy expenditure required for the weight goal.;Weight Management: Provide education and appropriate resources to help participant work on and  attain dietary goals.;Weight Management/Obesity: Establish reasonable short term and long term weight goals.;Obesity: Provide education and appropriate resources to help participant work on and attain dietary goals.    Admit Weight 169 lb 9.6 oz (76.9 kg)    Goal Weight: Short Term 164 lb (74.4 kg)    Goal Weight: Long Term 160 lb (72.6 kg)    Expected Outcomes Short Term: Continue to assess and modify interventions until short term weight is achieved;Long Term: Adherence to nutrition and physical activity/exercise program aimed toward attainment of established weight goal;Weight Loss: Understanding of general recommendations for a balanced deficit meal plan, which promotes 1-2 lb weight loss per week and includes a negative energy balance of [phone] kcal/d;Understanding recommendations for meals to include 15-35% energy as protein, 25-35% energy from fat, 35-60% energy from carbohydrates, less than 214m of dietary cholesterol, 20-35 gm of total fiber daily;Understanding of distribution of calorie intake throughout the day with the consumption of 4-5 meals/snacks    Hypertension Yes    Intervention Provide education on lifestyle modifcations including regular physical activity/exercise, weight management, moderate sodium restriction and increased consumption of fresh fruit, vegetables, and low fat dairy, alcohol moderation, and smoking cessation.;Monitor prescription use compliance.    Expected Outcomes Short Term: Continued assessment and intervention until BP is < 140/989mHG in hypertensive participants. < 130/8029mG in hypertensive participants with diabetes, heart failure or chronic kidney disease.;Long Term: Maintenance of blood pressure at goal levels.    Lipids Yes    Intervention Provide education and support for participant on nutrition & aerobic/resistive exercise along with prescribed medications to achieve LDL <86m22mDL >40mg3m Expected Outcomes Short Term: Participant states  understanding of desired cholesterol values and is compliant with medications prescribed. Participant is following exercise prescription and nutrition guidelines.;Long Term: Cholesterol controlled with medications as prescribed, with individualized exercise RX and with personalized nutrition plan. Value goals: LDL < 86mg,102m > 40 mg.             Education:Diabetes - Individual verbal and written instruction to review signs/symptoms of diabetes, desired ranges of glucose level fasting, after meals and with exercise. Acknowledge that pre and post exercise glucose checks will be done for 3 sessions at entry of program.   Core Components/Risk Factors/Patient Goals Review:    Core Components/Risk Factors/Patient Goals at Discharge (Final Review):    ITP Comments:  ITP Comments     Row Name 06/02/21 1402 06/11/21 1213 06/17/21 1101 06/18/21 0654     ITP Comments Initial telephone orientation completed. Diagnosis can be found in [facility] EP orientation scheduled for Wednesday 8/3 at  11amSantiago Glad presented to rehab for gym orientation- 6MWT was not completed as patient had open-toes shoes. Will complete 6MWT next week on 8/9. First full day of exercise!  Patient was oriented to gym and equipment including functions, settings, policies, and procedures.  Patient's individual exercise prescription and treatment plan were reviewed.  All starting workloads were established based on the results of the 6 minute walk test done at initial orientation visit.  The plan for exercise progression was also introduced and progression will be customized based on patient's performance and goals. 30 Day review completed. Medical Director ITP review done, changes made as directed, and signed approval by Medical Director.    New to program             Comments:

## 2021-06-19 ENCOUNTER — Encounter: Payer: Medicare PPO | Admitting: *Deleted

## 2021-06-19 ENCOUNTER — Other Ambulatory Visit: Payer: Self-pay

## 2021-06-19 DIAGNOSIS — Z955 Presence of coronary angioplasty implant and graft: Secondary | ICD-10-CM | POA: Diagnosis not present

## 2021-06-19 NOTE — Progress Notes (Signed)
Daily Session Note  Patient Details  Name: Morgan Moore MRN: [id number] Date of Birth: 1956/09/16 Referring Provider:   Flowsheet Row Cardiac Rehab from 06/17/2021 in [facility] Cardiac and Pulmonary Rehab  Referring Provider Lujean Amel MD       Encounter Date: 06/19/2021  Check In:  Session Check In - 06/19/21 1116       Check-In   Supervising physician immediately available to respond to emergencies See telemetry face sheet for immediately available ER MD    Location [facility]-Cardiac & Pulmonary Rehab    Staff Present Heath Lark, RN, BSN, CCRP;Melissa [place], RDN, Rowe Pavy, BA, ACSM CEP, Exercise Physiologist;Kelly Rosalia Hammers, MPA, Nino Glow, MS, ASCM CEP, Exercise Physiologist    Virtual Visit No    Medication changes reported     No    Fall or balance concerns reported    No    Warm-up and Cool-down Performed on first and last piece of equipment    Resistance Training Performed Yes    VAD Patient? No    PAD/SET Patient? No      VAD patient   Has back up controller? No      Pain Assessment   Currently in Pain? No/denies                Social History   Tobacco Use  Smoking Status Never  Smokeless Tobacco Never    Goals Met:  Independence with exercise equipment Exercise tolerated well Personal goals reviewed No report of cardiac concerns or symptoms  Goals Unmet:  Not Applicable  Comments: Pt able to follow exercise prescription today without complaint.  Will continue to monitor for progression.    Dr. Emily Filbert is Medical Director for [facility].  Dr. Ottie Glazier is Medical Director for [facility] Pulmonary Rehabilitation.

## 2021-06-24 ENCOUNTER — Other Ambulatory Visit: Payer: Self-pay

## 2021-06-24 DIAGNOSIS — Z955 Presence of coronary angioplasty implant and graft: Secondary | ICD-10-CM | POA: Diagnosis not present

## 2021-06-24 NOTE — Progress Notes (Signed)
Daily Session Note  Patient Details  Name: Morgan Moore MRN: [id number] Date of Birth: October 10, 1956 Referring Provider:   Flowsheet Row Cardiac Rehab from 06/17/2021 in [facility] Cardiac and Pulmonary Rehab  Referring Provider Lujean Amel MD       Encounter Date: 06/24/2021  Check In:  Session Check In - 06/24/21 1119       Check-In   Supervising physician immediately available to respond to emergencies See telemetry face sheet for immediately available ER MD    Location [facility]-Cardiac & Pulmonary Rehab    Staff Present Birdie Sons, MPA, RN;Melissa [place], RDN, Rowe Pavy, BA, ACSM CEP, Exercise Physiologist    Virtual Visit No    Medication changes reported     No    Fall or balance concerns reported    No    Warm-up and Cool-down Performed on first and last piece of equipment    Resistance Training Performed Yes    VAD Patient? No    PAD/SET Patient? No      Pain Assessment   Currently in Pain? No/denies                Social History   Tobacco Use  Smoking Status Never  Smokeless Tobacco Never    Goals Met:  Independence with exercise equipment Exercise tolerated well No report of cardiac concerns or symptoms Strength training completed today  Goals Unmet:  Not Applicable  Comments: Pt able to follow exercise prescription today without complaint.  Will continue to monitor for progression.    Dr. Emily Filbert is Medical Director for [facility].  Dr. Ottie Glazier is Medical Director for [facility] Pulmonary Rehabilitation.

## 2021-06-27 ENCOUNTER — Telehealth: Payer: Self-pay | Admitting: Internal Medicine

## 2021-06-27 ENCOUNTER — Telehealth: Payer: Self-pay | Admitting: *Deleted

## 2021-06-27 DIAGNOSIS — R0602 Shortness of breath: Secondary | ICD-10-CM | POA: Diagnosis not present

## 2021-06-27 DIAGNOSIS — Z885 Allergy status to narcotic agent status: Secondary | ICD-10-CM | POA: Diagnosis not present

## 2021-06-27 DIAGNOSIS — K5090 Crohn's disease, unspecified, without complications: Secondary | ICD-10-CM | POA: Diagnosis not present

## 2021-06-27 DIAGNOSIS — I739 Peripheral vascular disease, unspecified: Secondary | ICD-10-CM | POA: Diagnosis not present

## 2021-06-27 DIAGNOSIS — Z882 Allergy status to sulfonamides status: Secondary | ICD-10-CM | POA: Diagnosis not present

## 2021-06-27 DIAGNOSIS — Z7902 Long term (current) use of antithrombotics/antiplatelets: Secondary | ICD-10-CM | POA: Diagnosis not present

## 2021-06-27 DIAGNOSIS — R059 Cough, unspecified: Secondary | ICD-10-CM | POA: Diagnosis not present

## 2021-06-27 DIAGNOSIS — Z7982 Long term (current) use of aspirin: Secondary | ICD-10-CM | POA: Diagnosis not present

## 2021-06-27 DIAGNOSIS — K219 Gastro-esophageal reflux disease without esophagitis: Secondary | ICD-10-CM | POA: Diagnosis not present

## 2021-06-27 DIAGNOSIS — E785 Hyperlipidemia, unspecified: Secondary | ICD-10-CM | POA: Diagnosis not present

## 2021-06-27 DIAGNOSIS — Z8616 Personal history of COVID-19: Secondary | ICD-10-CM

## 2021-06-27 DIAGNOSIS — R0600 Dyspnea, unspecified: Secondary | ICD-10-CM | POA: Diagnosis not present

## 2021-06-27 DIAGNOSIS — M7918 Myalgia, other site: Secondary | ICD-10-CM | POA: Diagnosis not present

## 2021-06-27 DIAGNOSIS — U071 COVID-19: Secondary | ICD-10-CM | POA: Diagnosis not present

## 2021-06-27 HISTORY — DX: Personal history of COVID-19: Z86.16

## 2021-06-27 NOTE — Telephone Encounter (Signed)
Grenda called program to inform them she tested positive for COVID today. Information given on return date in two weeks.

## 2021-06-27 NOTE — Telephone Encounter (Signed)
Patient informed, Due to the high volume of calls and your symptoms we have to forward your call to our Triage Nurse to expedient your call. Please hold for the transfer.  Patient transferred to Access Nurse. Due to running a fever since Tuesday night and testing positive for COVID this morning.She is unsure what to take for this due to having a stint put in recently.No openings in office or virtual.

## 2021-06-27 NOTE — Telephone Encounter (Signed)
Patient was instructed to go to UC and patient stated she would comply per access nurse. Sx have worsen and patient stated she will def go.

## 2021-06-28 ENCOUNTER — Other Ambulatory Visit: Payer: Self-pay | Admitting: Internal Medicine

## 2021-06-28 NOTE — Progress Notes (Signed)
Opened in error.  Tried to call pt. Unable to reach.  Busy.

## 2021-06-29 NOTE — Telephone Encounter (Signed)
Late entry.  Tried to call pt.  Unable to reach - line busy.  Reviewed chart.  Confirm pt was evaluated.  Per note, was feeling better.  Please call and confirm she is doing ok.

## 2021-06-30 NOTE — Telephone Encounter (Signed)
Patient went to the ED, has no fever is afebrile, and speaking in full sentences, has been walking outside, but has noticed her 02 sats are staying about 92 % on room air. She was not given any new medications due to her medication allergies. Patient says she does not feel SOB, but still feels a little lite headed, says she feels this due to not having her strength back.  Patient says she has very little coughing at this time and feels she is ok. I advised if 02 drops below 90% and stay there with rest she needs to call the office and advise, if after hours she should call and speak with on call.  Also advised drops below 88% she needs to be seen. Patient did state she walked 3 laps around yard yesterday without difficulty, but was tired afterwards and rested.

## 2021-07-01 NOTE — Telephone Encounter (Signed)
Spoke with pt and she stated that she is starting to feel better. She stated that her O2 all day yesterday was 92% and this morning it has came up to 93%.

## 2021-07-01 NOTE — Telephone Encounter (Signed)
Reviewed.  Needs to keep [place] posted regarding symptoms and if feels needs evaluation or if oxygent remains low.

## 2021-07-02 NOTE — Telephone Encounter (Signed)
Symptoms improved.  Continue to monitor.

## 2021-07-10 ENCOUNTER — Telehealth: Payer: Self-pay

## 2021-07-10 ENCOUNTER — Encounter: Payer: Medicare PPO | Attending: Internal Medicine

## 2021-07-10 DIAGNOSIS — Z955 Presence of coronary angioplasty implant and graft: Secondary | ICD-10-CM | POA: Insufficient documentation

## 2021-07-10 NOTE — Telephone Encounter (Signed)
Called Morgan Moore as she was due back to rehab 8/30 after having COVID and wanted to check in as we have not heard from her.

## 2021-07-16 ENCOUNTER — Encounter: Payer: Self-pay | Admitting: *Deleted

## 2021-07-16 DIAGNOSIS — Z955 Presence of coronary angioplasty implant and graft: Secondary | ICD-10-CM

## 2021-07-16 NOTE — Progress Notes (Signed)
Cardiac Individual Treatment Plan  Patient Details  Name: Morgan Moore MRN: [id number] Date of Birth: December 05, 1955 Referring Provider:   Flowsheet Row Cardiac Rehab from 06/17/2021 in [facility] Cardiac and Pulmonary Rehab  Referring Provider Lujean Amel MD       Initial Encounter Date:  Flowsheet Row Cardiac Rehab from 06/17/2021 in [facility] Cardiac and Pulmonary Rehab  Date 06/17/21       Visit Diagnosis: Status post coronary artery stent placement  Patient's Home Medications on Admission:  Current Outpatient Medications:    aspirin EC 81 MG tablet, Take 81 mg by mouth daily., Disp: , Rfl:    carvedilol (COREG) 3.125 MG tablet, Take 1.5625 mg by mouth 2 (two) times daily with a meal. (Patient not taking: Reported on 06/02/2021), Disp: , Rfl:    Cholecalciferol 25 MCG (1000 UT) tablet, Take 1,000 Units by mouth 2 (two) times daily., Disp: , Rfl:    ciclopirox (PENLAC) 8 % solution, Apply topically at bedtime. Apply over nail and surrounding skin. Apply daily over previous coat. After seven (7) days, may remove with alcohol and continue cycle., Disp: 6.6 mL, Rfl: 1   clopidogrel (PLAVIX) 75 MG tablet, Take 75 mg by mouth daily., Disp: , Rfl:    cyanocobalamin 1000 MCG tablet, Take 1,000 mcg by mouth daily at 12 noon. , Disp: , Rfl:    diphenhydrAMINE (BENADRYL) 12.5 MG/5ML elixir, Take 12.5 mg by mouth every 6 (six) hours as needed (allergic reaction)., Disp: , Rfl:    EPINEPHrine 0.3 mg/0.3 mL IJ SOAJ injection, Inject 0.3 mg into the muscle once. , Disp: , Rfl:    escitalopram (LEXAPRO) 20 MG tablet, TAKE 1 TABLET BY MOUTH ONCE DAILY. (Patient taking differently: Take 20 mg by mouth at bedtime.), Disp: 90 tablet, Rfl: 0   Fe Fum-FePoly-Vit C-Vit B3 (INTEGRA) 62.5-62.5-40-3 MG CAPS, TAKE (1) CAPSULE BY MOUTH ONCE DAILY., Disp: 90 capsule, Rfl: 0   Melatonin-Pyridoxine 3-10 MG TABS, Take 1 tablet by mouth at bedtime., Disp: , Rfl:    mesalamine (LIALDA) 1.2 g EC tablet, Take 1.2 g by  mouth 3 (three) times daily with meals. , Disp: , Rfl:    mupirocin ointment (BACTROBAN) 2 %, Apply to affected area bid (Patient taking differently: Apply 1 application topically 2 (two) times daily as needed (rash in head).), Disp: 22 g, Rfl: 0   nitroGLYCERIN (NITROSTAT) 0.4 MG SL tablet, DISSOLVE (1) TABLET UNDER TONGUE AS NEEDED TO RELIEVE CHEST PAIN. MAYREPEAT EVERY 5 MINUTES., Disp: 25 tablet, Rfl: 0   ondansetron (ZOFRAN-ODT) 4 MG disintegrating tablet, Take 4 mg by mouth daily as needed for nausea. , Disp: , Rfl:    pantoprazole (PROTONIX) 20 MG tablet, Take 20 mg by mouth daily. , Disp: , Rfl:    prednisoLONE acetate (PRED FORTE) 1 % ophthalmic suspension, Place 1 drop into both eyes 2 (two) times daily as needed (for inflammation)., Disp: , Rfl:    rizatriptan (MAXALT) 5 MG tablet, USE AS DIRECTED, Disp: 10 tablet, Rfl: 0   rosuvastatin (CRESTOR) 5 MG tablet, Take 1-2 tablets q day., Disp: 60 tablet, Rfl: 4   verapamil (CALAN-SR) 120 MG CR tablet, Take 120 mg by mouth every evening. , Disp: , Rfl:   Past Medical History: Past Medical History:  Diagnosis Date   Allergy    Anemia    Anxiety    Chicken pox    Complication of anesthesia    vomitting   Coronary artery disease    Crohn disease ([place])  DVT (deep venous thrombosis) ([place]) 2017   Dyspnea    on excertion   Dysrhythmia 2019   palpitations   GERD (gastroesophageal reflux disease)    Heart disease    Heart murmur    Heart murmur    History of kidney stones    Hyperlipidemia    Hypertension    Inflammatory bowel disease    Migraines    hormonal, puberty   Myocardial infarction ([place])    Phlebitis    PONV (postoperative nausea and vomiting)     Tobacco Use: Social History   Tobacco Use  Smoking Status Never  Smokeless Tobacco Never    Labs: Recent Review Flowsheet Data     Labs for ITP Cardiac and Pulmonary Rehab Latest Ref Rng & Units 10/23/2019 03/11/2020 07/16/2020 11/14/2020 03/17/2021   Cholestrol 0 - 200  mg/dL 167 193 180 172 174   LDLCALC 0 - 99 mg/dL 97 114(H) 104(H) 102(H) 101(H)   HDL >39.00 mg/dL 48.00 48.70 45.10 40.80 50.30   Trlycerides 0.0 - 149.0 mg/dL 111.0 153.0(H) 156.0(H) 146.0 117.0        Exercise Target Goals: Exercise Program Goal: Individual exercise prescription set using results from initial 6 min walk test and THRR while considering  patient's activity barriers and safety.   Exercise Prescription Goal: Initial exercise prescription builds to 30-45 minutes a day of aerobic activity, 2-3 days per week.  Home exercise guidelines will be given to patient during program as part of exercise prescription that the participant will acknowledge.   Education: Aerobic Exercise: - Group verbal and visual presentation on the components of exercise prescription. Introduces F.I.T.T principle from ACSM for exercise prescriptions.  Reviews F.I.T.T. principles of aerobic exercise including progression. Written material given at graduation. 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 Introduces F.I.T.T principle from ACSM for exercise prescriptions  Reviews F.I.T.T. principles of resistance exercise including progression. Written material given at graduation.    Education: Exercise & Equipment Safety: - Individual verbal instruction and demonstration of equipment use and safety with use of the equipment. Flowsheet Row Cardiac Rehab from 06/11/2021 in [facility] Cardiac and Pulmonary Rehab  Education need identified 06/11/21  Date 06/11/21  Educator [place]  Instruction Review Code 1- Verbalizes Understanding       Education: Exercise Physiology & General Exercise Guidelines: - Group verbal and written instruction with models to review the  exercise physiology of the cardiovascular system and associated critical values. Provides general exercise guidelines with specific guidelines to those with heart or lung disease.  Flowsheet Row Cardiac Rehab from 09/14/2016 in [facility] Cardiac and Pulmonary Rehab  Date 08/10/16  Educator [facility]  Instruction Review Code (retired) 2- meets goals/outcomes       Education: Flexibility, Insurance underwriter, Mind/Body Relaxation: - Group verbal and visual presentation with interactive activity on the components of exercise prescription. Introduces F.I.T.T principle from ACSM for exercise prescriptions. Reviews F.I.T.T. principles of flexibility and balance exercise training including progression. Also discusses the mind body connection.  Reviews various relaxation techniques to help reduce and manage stress (i.e. Deep breathing, progressive muscle relaxation, and visualization). Balance handout provided to take home. Written material given at graduation. Flowsheet Row Cardiac Rehab from 09/14/2016 in [facility] Cardiac and Pulmonary Rehab  Date 08/17/16  Educator [employer]  Instruction Review Code (retired) 2- meets goals/outcomes       Activity Barriers & Risk Stratification:  Activity Barriers & Cardiac Risk Stratification - 06/11/21 1224       Activity Barriers & Cardiac Risk Stratification   Activity Barriers Muscular Weakness;Deconditioning;Other (comment)    Comments Limited ROM moving arms with flexion    Cardiac Risk Stratification High             6 Minute Walk:  6 Minute Walk     Row Name 06/17/21 1113         6 Minute Walk   Phase Initial     Distance 1275 feet     Walk Time 6 minutes     # of Rest Breaks 0     MPH 2.41     METS 2.79     RPE 11     Perceived Dyspnea  1     VO2 Peak 9.76     Symptoms Yes (comment)     Comments SOB     Resting HR 64 bpm     Resting BP 134/66     Resting Oxygen Saturation  96 %     Exercise Oxygen Saturation  during 6 min walk 96 %     Max Ex. HR 94 bpm      Max Ex. BP 138/74     2 Minute Post BP 118/60              Oxygen Initial Assessment:   Oxygen Re-Evaluation:   Oxygen Discharge (Final Oxygen Re-Evaluation):   Initial Exercise Prescription:  Initial Exercise Prescription - 06/17/21 1100       Date of Initial Exercise RX and Referring Provider   Date 06/17/21    Referring Provider Lujean Amel MD      Treadmill   MPH 2.3    Grade 0.5    Minutes 15    METs 2.92      NuStep   Level 2    SPM 80    Minutes 15    METs 2.7      Biostep-RELP   Level 2    SPM 50    Minutes 15    METs 2      Prescription Details   Frequency (times per week) 2    Duration Progress to 30 minutes of continuous aerobic without signs/symptoms of physical distress      Intensity   THRR 40-80% of Max Heartrate 100-136    Ratings of Perceived Exertion 11-13    Perceived Dyspnea 0-4      Progression   Progression Continue to progress workloads to maintain intensity without signs/symptoms of physical distress.      Resistance Training   Training Prescription Yes    Weight 3 lb    Reps 10-15             Perform Capillary Blood Glucose checks as needed.  Exercise Prescription Changes:   Exercise Prescription Changes     Row Name 06/17/21 1100 06/30/21 1400           Response to Exercise   Blood Pressure (Admit) 134/66 120/60      Blood Pressure (Exercise) 138/74 122/64      Blood Pressure (Exit) 118/60 118/62      Heart Rate (Admit) 64 bpm 72 bpm      Heart Rate (Exercise) 94 bpm 93 bpm      Heart Rate (Exit) 71 bpm 74 bpm  Oxygen Saturation (Admit) 96 % --      Oxygen Saturation (Exercise) 96 % --      Rating of Perceived Exertion (Exercise) 11 12      Perceived Dyspnea (Exercise) 1 --      Symptoms SOB none      Comments walk test results second day      Duration -- Progress to 30 minutes of  aerobic without signs/symptoms of physical distress      Intensity -- THRR unchanged             Progression    Progression -- Continue to progress workloads to maintain intensity without signs/symptoms of physical distress.      Average METs -- 2.7             Resistance Training   Training Prescription -- Yes      Weight -- 2 lb      Reps -- 10-15             Treadmill   MPH -- 2.3      Grade -- 0.5      Minutes -- 15      METs -- 2.92             NuStep   Level -- 2      Minutes -- 15      METs -- 2.5               Exercise Comments:   Exercise Comments     Row Name 06/17/21 1101           Exercise Comments First full day of exercise!  Patient was oriented to gym and equipment including functions, settings, policies, and procedures.  Patient's individual exercise prescription and treatment plan were reviewed.  All starting workloads were established based on the results of the 6 minute walk test done at initial orientation visit.  The plan for exercise progression was also introduced and progression will be customized based on patient's performance and goals.                Exercise Goals and Review:   Exercise Goals     Row Name 06/17/21 1118             Exercise Goals   Increase Physical Activity Yes       Intervention Provide advice, education, support and counseling about physical activity/exercise needs.;Develop an individualized exercise prescription for aerobic and resistive training based on initial evaluation findings, risk stratification, comorbidities and participant's personal goals.       Expected Outcomes Short Term: Attend rehab on a regular basis to increase amount of physical activity.;Long Term: Add in home exercise to make exercise part of routine and to increase amount of physical activity.;Long Term: Exercising regularly at least 3-5 days a week.       Increase Strength and Stamina Yes       Intervention Provide advice, education, support and counseling about physical activity/exercise needs.;Develop an individualized exercise prescription  for aerobic and resistive training based on initial evaluation findings, risk stratification, comorbidities and participant's personal goals.       Expected Outcomes Short Term: Increase workloads from initial exercise prescription for resistance, speed, and METs.;Short Term: Perform resistance training exercises routinely during rehab and add in resistance training at home;Long Term: Improve cardiorespiratory fitness, muscular endurance and strength as measured by increased METs and functional capacity (6MWT)       Able to understand and use  rate of perceived exertion (RPE) scale Yes       Intervention Provide education and explanation on how to use RPE scale       Expected Outcomes Short Term: Able to use RPE daily in rehab to express subjective intensity level;Long Term:  Able to use RPE to guide intensity level when exercising independently       Able to understand and use Dyspnea scale Yes       Intervention Provide education and explanation on how to use Dyspnea scale       Expected Outcomes Short Term: Able to use Dyspnea scale daily in rehab to express subjective sense of shortness of breath during exertion;Long Term: Able to use Dyspnea scale to guide intensity level when exercising independently       Knowledge and understanding of Target Heart Rate Range (THRR) Yes       Intervention Provide education and explanation of THRR including how the numbers were predicted and where they are located for reference       Expected Outcomes Short Term: Able to state/look up THRR;Short Term: Able to use daily as guideline for intensity in rehab;Long Term: Able to use THRR to govern intensity when exercising independently       Able to check pulse independently Yes       Intervention Provide education and demonstration on how to check pulse in carotid and radial arteries.;Review the importance of being able to check your own pulse for safety during independent exercise       Expected Outcomes Short Term:  Able to explain why pulse checking is important during independent exercise;Long Term: Able to check pulse independently and accurately       Understanding of Exercise Prescription Yes       Intervention Provide education, explanation, and written materials on patient's individual exercise prescription       Expected Outcomes Long Term: Able to explain home exercise prescription to exercise independently;Short Term: Able to explain program exercise prescription                Exercise Goals Re-Evaluation :  Exercise Goals Re-Evaluation     [place] Name 06/17/21 1141 06/30/21 1403           Exercise Goal Re-Evaluation   Exercise Goals Review Able to understand and use rate of perceived exertion (RPE) scale;Increase Physical Activity;Knowledge and understanding of Target Heart Rate Range (THRR);Understanding of Exercise Prescription;Increase Strength and Stamina;Able to understand and use Dyspnea scale;Able to check pulse independently Increase Physical Activity;Increase Strength and Stamina      Comments Reviewed RPE and dyspnea scales, THR and program prescription with pt today.  Pt voiced understanding and was given a copy of goals to take home. Kelci has done well in her first couple sessions.  We will monitor progress when she returns to class. (out until 8/29/)      Expected Outcomes Short: Use RPE daily to regulate intensity. Long: Follow program prescription in THR. Short:  attend consistently Long: improve overall stamina               Discharge Exercise Prescription (Final Exercise Prescription Changes):  Exercise Prescription Changes - 06/30/21 1400       Response to Exercise   Blood Pressure (Admit) 120/60    Blood Pressure (Exercise) 122/64    Blood Pressure (Exit) 118/62    Heart Rate (Admit) 72 bpm    Heart Rate (Exercise) 93 bpm    Heart Rate (Exit) 74 bpm  Rating of Perceived Exertion (Exercise) 12    Symptoms none    Comments second day    Duration Progress  to 30 minutes of  aerobic without signs/symptoms of physical distress    Intensity THRR unchanged      Progression   Progression Continue to progress workloads to maintain intensity without signs/symptoms of physical distress.    Average METs 2.7      Resistance Training   Training Prescription Yes    Weight 2 lb    Reps 10-15      Treadmill   MPH 2.3    Grade 0.5    Minutes 15    METs 2.92      NuStep   Level 2    Minutes 15    METs 2.5             Nutrition:  Target Goals: Understanding of nutrition guidelines, daily intake of sodium <1558m, cholesterol <201m calories 30% from fat and 7% or less from saturated fats, daily to have 5 or more servings of fruits and vegetables.  Education: All About Nutrition: -Group instruction provided by verbal, written material, interactive activities, discussions, models, and posters to present general guidelines for heart healthy nutrition including fat, fiber, MyPlate, the role of sodium in heart healthy nutrition, utilization of the nutrition label, and utilization of this knowledge for meal planning. Follow up email sent as well. Written material given at graduation. 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 She would also like to be re-evalutated by the GI MD - she has Crohns and has recently been diagnosed with diverticulosis             Nutrition Assessments:  MEDIFICTS Score Key: ?70 Need to make dietary changes  40-70 Heart Healthy Diet ? 40 Therapeutic Level Cholesterol Diet  [facility]  Picture Your Plate Total Score on Admission 57      Picture Your Plate Scores: <4<31nhealthy dietary pattern with much room for improvement. 41-50 Dietary pattern unlikely to meet recommendations for good health and room for improvement. 51-60 More healthful dietary pattern, with some room for improvement.  >60 Healthy dietary pattern, although there may be some specific behaviors that could be improved.    Nutrition Goals Re-Evaluation:   Nutrition Goals Discharge (Final Nutrition Goals Re-Evaluation):   Psychosocial: Target Goals: Acknowledge presence or absence of significant depression and/or stress, maximize coping skills, provide positive support system. Participant is able to verbalize types and ability to use techniques and skills needed for reducing stress and depression.   Education: Stress, Anxiety, and Depression - Group verbal and visual presentation to define topics covered.  Reviews how body is impacted by stress, anxiety, and depression.  Also discusses healthy ways to reduce stress and to treat/manage anxiety and depression.  Written material given at graduation. Flowsheet Row Cardiac Rehab from 09/14/2016 in [facility] Cardiac and Pulmonary Rehab  Date 07/29/16  Educator [facility]  Instruction Review Code (retired) 2- meets goals/outcomes       Education: Sleep Hygiene -Provides group verbal and written instruction about how sleep can affect your health.  Define sleep hygiene, discuss sleep cycles and impact of sleep habits. Review good sleep hygiene tips.    Initial Review & Psychosocial Screening:  Initial Psych Review & Screening - 06/02/21 1427       Initial Review   Current issues with Current Stress Concerns    Source of Stress  Concerns Unable to perform yard/household activities      [place]? Yes   son (lives out of town), close by family, friends and church family     Barriers   Psychosocial barriers to participate in program There are no identifiable barriers or psychosocial needs.;The patient should benefit from training in stress management and relaxation.      Screening Interventions   Interventions Encouraged to exercise;Provide feedback about the scores to participant;To provide support and resources with identified psychosocial needs    Expected Outcomes Short Term goal: Utilizing psychosocial counselor, staff and physician to assist with identification of specific Stressors or current issues interfering with healing process. Setting desired goal for each stressor or current issue identified.;Long Term Goal: Stressors or current issues are controlled or eliminated.;Short Term goal: Identification and review with participant of any Quality of Life or Depression concerns found by scoring the questionnaire.;Long Term goal: The participant improves quality of Life and PHQ9 Scores as seen by post scores and/or verbalization of changes             Quality of Life Scores:   Quality of Life - 06/11/21 1233       Quality of Life Scores   Health/Function Pre 25.14 %    Socioeconomic Pre 28.57 %    Psych/Spiritual Pre 25.64 %    Family Pre 26.88 %    GLOBAL Pre 26.22 %            Scores of 19 and below usually indicate a poorer quality of life in these areas.  A difference of  2-3 points is a clinically meaningful difference.  A difference of 2-3 points in the total score of the Quality of Life Index has been associated with significant improvement in overall quality of life, self-image, physical symptoms, and general health in studies assessing change in quality of life.  PHQ-9: Recent Review Flowsheet Data     Depression screen [facility] 2/9 06/11/2021 04/08/2021 03/19/2021  11/29/2020 07/18/2020   Decreased Interest 0 0 0 0 0   Down, Depressed, Hopeless 0 0 0 0 0   PHQ - 2 Score 0 0 0 0 0   Altered sleeping 0 0 0 0 -   Tired, decreased energy 0 0 1 0 -   Change in appetite 0 0 0 0 -   Feeling bad or failure about yourself  0 0 0 0 -   Trouble concentrating 0 0 0 0 -   Moving slowly or fidgety/restless 0 0 0 0 -   Suicidal thoughts 0 - - 0 -   PHQ-9 Score 0 0 1 0 -   Difficult doing work/chores Not difficult at  all Not difficult at all Not difficult at all Not difficult at all -      Interpretation of Total Score  Total Score Depression Severity:  1-4 = Minimal depression, 5-9 = Mild depression, 10-14 = Moderate depression, 15-19 = Moderately severe depression, 20-27 = Severe depression   Psychosocial Evaluation and Intervention:  Psychosocial Evaluation - 06/02/21 1429       Psychosocial Evaluation & Interventions   Interventions Encouraged to exercise with the program and follow exercise prescription    Comments Ms. Kanitz is returning to cardiac rehab (was here in 2017) after receiving two stents. She states her stamina needs improving and is hoping this program will help with that. She feels confident in her healthy lifestyle management skills, but needs some encouragement and guidance on increasing her stamina. She lives by herself and does all her own housework/yardwork. 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 She does have a son that lives out of town, is surrounded by family and friends and is very involved in her church family. She states she feels blessed to be surrounded by such an amazing support system, especially after dealing with the passing of her daughter in a car accident years ago and a lot of her family passing due to heart disease.    Expected Outcomes Short: attend cardiac rehab for education and exercise. Long: develop and maintain positive self care habits    Continue Psychosocial  Services  Follow up required by staff             Psychosocial Re-Evaluation:   Psychosocial Discharge (Final Psychosocial Re-Evaluation):   Vocational Rehabilitation: Provide vocational rehab assistance to qualifying candidates.   Vocational Rehab Evaluation & Intervention:  Vocational Rehab - 06/02/21 1427       Initial Vocational Rehab Evaluation & Intervention   Assessment shows need for Vocational Rehabilitation No             Education: Education Goals: Education classes will be provided on a variety of topics geared toward better understanding of heart health and risk factor modification. Participant will state understanding/return demonstration of topics presented as noted by education test scores.  Learning Barriers/Preferences:  Learning Barriers/Preferences - 06/02/21 1426       Learning Barriers/Preferences   Learning Barriers None    Learning Preferences None             General Cardiac Education Topics:  AED/CPR: - Group verbal and written instruction with the use of models to demonstrate the basic use of the AED with the basic ABC's of resuscitation.   Anatomy and Cardiac Procedures: - Group verbal and visual presentation and models provide information about basic cardiac anatomy and function. Reviews the testing methods done to diagnose heart disease and the outcomes of the test results. Describes the treatment choices: Medical Management, Angioplasty, or Coronary Bypass Surgery for treating various heart conditions including Myocardial Infarction, Angina, Valve Disease, and Cardiac Arrhythmias.  Written material given at graduation. Flowsheet Row Cardiac Rehab from 09/14/2016 in [facility] Cardiac and Pulmonary Rehab  Date 08/24/16  Educator CE  Instruction Review Code (retired) 2- meets goals/outcomes       Medication Safety: - Group verbal and visual instruction to review commonly prescribed medications for heart and lung disease. Reviews  the medication, class of the drug, and side effects. Includes the steps to properly store meds and maintain the prescription regimen.  Written material given at graduation. Flowsheet Row Cardiac  Rehab from 09/14/2016 in [facility] Cardiac and Pulmonary Rehab  Date 09/07/16  Educator SB  Instruction Review Code (retired) 2- meets goals/outcomes       Intimacy: - Group verbal instruction through game format to discuss how heart and lung disease can affect sexual intimacy. Written material given at graduation.. Flowsheet Row Cardiac Rehab from 09/14/2016 in [facility] Cardiac and Pulmonary Rehab  Date 09/14/16  Educator [place]  Instruction Review Code (retired) 2- meets goals/outcomes       Know Your Numbers and Heart Failure: - Group verbal and visual instruction to discuss disease risk factors for cardiac and pulmonary disease and treatment options.  Reviews associated critical values for Overweight/Obesity, Hypertension, Cholesterol, and Diabetes.  Discusses basics of heart failure: signs/symptoms and treatments.  Introduces Heart Failure Zone chart for action plan for heart failure.  Written material given at graduation. Flowsheet Row Cardiac Rehab from 09/14/2016 in [facility] Cardiac and Pulmonary Rehab  Date 08/24/16  Educator CE  Instruction Review Code (retired) 2- meets goals/outcomes       Infection Prevention: - Provides verbal and written material to individual with discussion of infection control including proper hand washing and proper equipment cleaning during exercise session. Flowsheet Row Cardiac Rehab from 06/11/2021 in [facility] Cardiac and Pulmonary Rehab  Education need identified 06/11/21  Date 06/11/21  Educator [place]  Instruction Review Code 1- Verbalizes Understanding       Falls Prevention: - Provides verbal and written material to individual with discussion of falls prevention and safety. Flowsheet Row Cardiac Rehab from 06/11/2021 in [facility] Cardiac and Pulmonary Rehab  Education  need identified 06/11/21  Date 06/11/21  Educator [place]  Instruction Review Code 1- Verbalizes Understanding       Other: -Provides group and verbal instruction on various topics (see comments)   Knowledge Questionnaire Score:  Knowledge Questionnaire Score - 06/11/21 1232       Knowledge Questionnaire Score   Pre Score 25/26: Exercise             Core Components/Risk Factors/Patient Goals at Admission:  Personal Goals and Risk Factors at Admission - 06/17/21 1119       Core Components/Risk Factors/Patient Goals on Admission    Weight Management Yes;Obesity;Weight Loss    Intervention Weight Management: Develop a combined nutrition and exercise program designed to reach desired caloric intake, while maintaining appropriate intake of nutrient and fiber, sodium and fats, and appropriate energy expenditure required for the weight goal.;Weight Management: Provide education and appropriate resources to help participant work on and attain dietary goals.;Weight Management/Obesity: Establish reasonable short term and long term weight goals.;Obesity: Provide education and appropriate resources to help participant work on and attain dietary goals.    Admit Weight 169 lb 9.6 oz (76.9 kg)    Goal Weight: Short Term 164 lb (74.4 kg)    Goal Weight: Long Term 160 lb (72.6 kg)    Expected Outcomes Short Term: Continue to assess and modify interventions until short term weight is achieved;Long Term: Adherence to nutrition and physical activity/exercise program aimed toward attainment of established weight goal;Weight Loss: Understanding of general recommendations for a balanced deficit meal plan, which promotes 1-2 lb weight loss per week and includes a negative energy balance of [phone] kcal/d;Understanding recommendations for meals to include 15-35% energy as protein, 25-35% energy from fat, 35-60% energy from carbohydrates, less than 248m of dietary cholesterol, 20-35 gm of total fiber  daily;Understanding of distribution of calorie intake throughout the day with the consumption of 4-5 meals/snacks  Hypertension Yes    Intervention Provide education on lifestyle modifcations including regular physical activity/exercise, weight management, moderate sodium restriction and increased consumption of fresh fruit, vegetables, and low fat dairy, alcohol moderation, and smoking cessation.;Monitor prescription use compliance.    Expected Outcomes Short Term: Continued assessment and intervention until BP is < 140/15m HG in hypertensive participants. < 130/854mHG in hypertensive participants with diabetes, heart failure or chronic kidney disease.;Long Term: Maintenance of blood pressure at goal levels.    Lipids Yes    Intervention Provide education and support for participant on nutrition & aerobic/resistive exercise along with prescribed medications to achieve LDL <7021mHDL >40m34m  Expected Outcomes Short Term: Participant states understanding of desired cholesterol values and is compliant with medications prescribed. Participant is following exercise prescription and nutrition guidelines.;Long Term: Cholesterol controlled with medications as prescribed, with individualized exercise RX and with personalized nutrition plan. Value goals: LDL < 70mg69mL > 40 mg.             Education:Diabetes - Individual verbal and written instruction to review signs/symptoms of diabetes, desired ranges of glucose level fasting, after meals and with exercise. Acknowledge that pre and post exercise glucose checks will be done for 3 sessions at entry of program.   Core Components/Risk Factors/Patient Goals Review:    Core Components/Risk Factors/Patient Goals at Discharge (Final Review):    ITP Comments:  ITP Comments     Row Name 06/02/21 1402 06/11/21 1213 06/17/21 1101 06/18/21 0654 07/16/21 0602   ITP Comments Initial telephone orientation completed. Diagnosis can be found in [facility]. EP  orientation scheduled for Wednesday 8/3 at 11am.Butler Beachented to rehab for gym orientation- 6MWT was not completed as patient had open-toes shoes. Will complete 6MWT next week on 8/9. First full day of exercise!  Patient was oriented to gym and equipment including functions, settings, policies, and procedures.  Patient's individual exercise prescription and treatment plan were reviewed.  All starting workloads were established based on the results of the 6 minute walk test done at initial orientation visit.  The plan for exercise progression was also introduced and progression will be customized based on patient's performance and goals. 30 Day review completed. Medical Director ITP review done, changes made as directed, and signed approval by Medical Director.    New to program 30 Day review completed. Medical Director ITP review done, changes made as directed, and signed approval by Medical Director.            Comments:

## 2021-07-22 ENCOUNTER — Other Ambulatory Visit: Payer: Self-pay

## 2021-07-22 DIAGNOSIS — Z955 Presence of coronary angioplasty implant and graft: Secondary | ICD-10-CM

## 2021-07-22 NOTE — Progress Notes (Signed)
Daily Session Note  Patient Details  Name: Morgan Moore MRN: [id number] Date of Birth: 1956/02/23 Referring Provider:   Flowsheet Row Cardiac Rehab from 06/17/2021 in [facility] Cardiac and Pulmonary Rehab  Referring Provider Lujean Amel MD       Encounter Date: 07/22/2021  Check In:  Session Check In - 07/22/21 1109       Check-In   Supervising physician immediately available to respond to emergencies See telemetry face sheet for immediately available ER MD    Location [facility]-Cardiac & Pulmonary Rehab    Staff Present Birdie Sons, MPA, RN;Jessica [place], MA, RCEP, CCRP, CCET;Amanda Sommer, BA, ACSM CEP, Exercise Physiologist    Virtual Visit No    Medication changes reported     No    Fall or balance concerns reported    No    Warm-up and Cool-down Performed on first and last piece of equipment    Resistance Training Performed Yes    VAD Patient? No    PAD/SET Patient? No      Pain Assessment   Currently in Pain? No/denies                Social History   Tobacco Use  Smoking Status Never  Smokeless Tobacco Never    Goals Met:  Independence with exercise equipment Exercise tolerated well No report of concerns or symptoms today Strength training completed today  Goals Unmet:  Not Applicable  Comments: Pt able to follow exercise prescription today without complaint.  Will continue to monitor for progression.    Dr. Emily Filbert is Medical Director for [place].  Dr. Ottie Glazier is Medical Director for [facility] Pulmonary Rehabilitation.

## 2021-07-24 ENCOUNTER — Other Ambulatory Visit: Payer: Self-pay

## 2021-07-24 DIAGNOSIS — Z955 Presence of coronary angioplasty implant and graft: Secondary | ICD-10-CM

## 2021-07-24 NOTE — Progress Notes (Signed)
Daily Session Note  Patient Details  Name: Mariadelosang Wynns MRN: [id number] Date of Birth: December 12, 1955 Referring Provider:   Flowsheet Row Cardiac Rehab from 06/17/2021 in [facility] Cardiac and Pulmonary Rehab  Referring Provider Lujean Amel MD       Encounter Date: 07/24/2021  Check In:  Session Check In - 07/24/21 1107       Check-In   Supervising physician immediately available to respond to emergencies See telemetry face sheet for immediately available ER MD    Location [facility]-Cardiac & Pulmonary Rehab    Staff Present Birdie Sons, MPA, RN;Jessica [place], MA, RCEP, CCRP, CCET;Joseph [place], [place]    Virtual Visit No    Medication changes reported     No    Fall or balance concerns reported    No    Warm-up and Cool-down Performed on first and last piece of equipment    Resistance Training Performed Yes    VAD Patient? No    PAD/SET Patient? No      Pain Assessment   Currently in Pain? No/denies                Social History   Tobacco Use  Smoking Status Never  Smokeless Tobacco Never    Goals Met:  Independence with exercise equipment Exercise tolerated well No report of concerns or symptoms today Strength training completed today  Goals Unmet:  Not Applicable  Comments: Pt able to follow exercise prescription today without complaint.  Will continue to monitor for progression.    Dr. Emily Filbert is Medical Director for [place].  Dr. Ottie Glazier is Medical Director for [facility] Pulmonary Rehabilitation.

## 2021-07-29 ENCOUNTER — Other Ambulatory Visit: Payer: Self-pay

## 2021-07-29 DIAGNOSIS — H903 Sensorineural hearing loss, bilateral: Secondary | ICD-10-CM | POA: Diagnosis not present

## 2021-07-29 DIAGNOSIS — Z955 Presence of coronary angioplasty implant and graft: Secondary | ICD-10-CM

## 2021-07-29 DIAGNOSIS — H6981 Other specified disorders of Eustachian tube, right ear: Secondary | ICD-10-CM | POA: Diagnosis not present

## 2021-07-29 DIAGNOSIS — H6983 Other specified disorders of Eustachian tube, bilateral: Secondary | ICD-10-CM | POA: Diagnosis not present

## 2021-07-29 NOTE — Progress Notes (Signed)
Daily Session Note  Patient Details  Name: Morgan Moore MRN: [id number] Date of Birth: 20-Feb-1956 Referring Provider:   Flowsheet Row Cardiac Rehab from 06/17/2021 in [facility] Cardiac and Pulmonary Rehab  Referring Provider Lujean Amel MD       Encounter Date: 07/29/2021  Check In:  Session Check In - 07/29/21 1108       Check-In   Supervising physician immediately available to respond to emergencies See telemetry face sheet for immediately available ER MD    Location [facility]-Cardiac & Pulmonary Rehab    Staff Present Birdie Sons, MPA, RN;Jessica [place], MA, RCEP, CCRP, CCET;Amanda Sommer, BA, ACSM CEP, Exercise Physiologist    Virtual Visit No    Medication changes reported     No    Fall or balance concerns reported    No    Warm-up and Cool-down Performed on first and last piece of equipment    Resistance Training Performed Yes    VAD Patient? No    PAD/SET Patient? No      Pain Assessment   Currently in Pain? No/denies                Social History   Tobacco Use  Smoking Status Never  Smokeless Tobacco Never    Goals Met:  Independence with exercise equipment Exercise tolerated well No report of concerns or symptoms today Strength training completed today  Goals Unmet:  Not Applicable  Comments: Pt able to follow exercise prescription today without complaint.  Will continue to monitor for progression.    Dr. Emily Filbert is Medical Director for [place].  Dr. Ottie Glazier is Medical Director for [facility] Pulmonary Rehabilitation.

## 2021-07-31 ENCOUNTER — Other Ambulatory Visit: Payer: Self-pay

## 2021-07-31 DIAGNOSIS — Z955 Presence of coronary angioplasty implant and graft: Secondary | ICD-10-CM | POA: Diagnosis not present

## 2021-07-31 NOTE — Progress Notes (Signed)
Daily Session Note  Patient Details  Name: Morgan Moore MRN: [id number] Date of Birth: [birth year] Referring Provider:   Flowsheet Row Cardiac Rehab from 06/17/2021 in [facility] Cardiac and Pulmonary Rehab  Referring Provider Lujean Amel MD       Encounter Date: 07/31/2021  Check In:  Session Check In - 07/31/21 1100       Check-In   Supervising physician immediately available to respond to emergencies See telemetry face sheet for immediately available ER MD    Location [facility]-Cardiac & Pulmonary Rehab    Staff Present Birdie Sons, MPA, RN;Amanda Sommer, BA, ACSM CEP, Exercise Physiologist;Jessica Luan [place], MA, RCEP, CCRP, CCET    Virtual Visit No    Medication changes reported     No    Fall or balance concerns reported    No    Warm-up and Cool-down Performed on first and last piece of equipment    Resistance Training Performed Yes    VAD Patient? No    PAD/SET Patient? No      VAD patient   Has back up controller? No      Pain Assessment   Currently in Pain? No/denies                Social History   Tobacco Use  Smoking Status Never  Smokeless Tobacco Never    Goals Met:  Independence with exercise equipment Exercise tolerated well No report of concerns or symptoms today Strength training completed today  Goals Unmet:  Not Applicable  Comments: Pt able to follow exercise prescription today without complaint.  Will continue to monitor for progression.    Dr. Emily Filbert is Medical Director for [place].  Dr. Ottie Glazier is Medical Director for [facility] Pulmonary Rehabilitation.

## 2021-08-01 ENCOUNTER — Ambulatory Visit
Admission: RE | Admit: 2021-08-01 | Discharge: 2021-08-01 | Disposition: A | Discharge status: Home / Self Care | Payer: Medicare PPO | Source: Ambulatory Visit | Attending: Internal Medicine | Admitting: Internal Medicine

## 2021-08-01 ENCOUNTER — Encounter: Payer: Self-pay | Admitting: Internal Medicine

## 2021-08-01 ENCOUNTER — Ambulatory Visit: Payer: Medicare PPO | Admitting: Internal Medicine

## 2021-08-01 ENCOUNTER — Other Ambulatory Visit: Payer: Self-pay

## 2021-08-01 DIAGNOSIS — E559 Vitamin D deficiency, unspecified: Secondary | ICD-10-CM

## 2021-08-01 DIAGNOSIS — I2510 Atherosclerotic heart disease of native coronary artery without angina pectoris: Secondary | ICD-10-CM | POA: Diagnosis not present

## 2021-08-01 DIAGNOSIS — K219 Gastro-esophageal reflux disease without esophagitis: Secondary | ICD-10-CM

## 2021-08-01 DIAGNOSIS — D509 Iron deficiency anemia, unspecified: Secondary | ICD-10-CM

## 2021-08-01 DIAGNOSIS — F32A Depression, unspecified: Secondary | ICD-10-CM

## 2021-08-01 DIAGNOSIS — E7800 Pure hypercholesterolemia, unspecified: Secondary | ICD-10-CM

## 2021-08-01 DIAGNOSIS — G43009 Migraine without aura, not intractable, without status migrainosus: Secondary | ICD-10-CM

## 2021-08-01 DIAGNOSIS — F439 Reaction to severe stress, unspecified: Secondary | ICD-10-CM | POA: Diagnosis not present

## 2021-08-01 DIAGNOSIS — F320 Major depressive disorder, single episode, mild: Secondary | ICD-10-CM | POA: Diagnosis not present

## 2021-08-01 DIAGNOSIS — I10 Essential (primary) hypertension: Secondary | ICD-10-CM | POA: Diagnosis not present

## 2021-08-01 DIAGNOSIS — I779 Disorder of arteries and arterioles, unspecified: Secondary | ICD-10-CM | POA: Diagnosis not present

## 2021-08-01 DIAGNOSIS — Z1231 Encounter for screening mammogram for malignant neoplasm of breast: Secondary | ICD-10-CM | POA: Insufficient documentation

## 2021-08-01 DIAGNOSIS — K50919 Crohn's disease, unspecified, with unspecified complications: Secondary | ICD-10-CM

## 2021-08-01 DIAGNOSIS — Z8616 Personal history of COVID-19: Secondary | ICD-10-CM

## 2021-08-01 NOTE — Progress Notes (Signed)
Patient ID: Missey Hasley, female   DOB: 1956-07-14, 65 y.o.   MRN: [id number]   Subjective:    Patient ID: Jordis Repetto, female    DOB: 07/13/1956, 64 y.o.   MRN: [id number]  This visit occurred during the SARS-CoV-2 public health emergency.  Safety protocols were in place, including screening questions prior to the visit, additional usage of staff PPE, and extensive cleaning of exam room while observing appropriate contact time as indicated for disinfecting solutions.   Patient here for a scheduled follow up.   Chief Complaint  Patient presents with   Hypertension   Hyperlipidemia   .   HPI Recently diagnosed with covid.  Feeling better.  Saw ENT yesterday.  Right ear - loss of hearing.  Some cough in am.  No increased chest pain.  Breathing overall stable.  Sees cardiology.  S/p PCI/DES - to LAD and mid RCA 04/2021.  Continues on aspirin and plavix. Going to cardiac rehab.  Eating.  No abdominal pain reported.  Compression hose.     Past Medical History:  Diagnosis Date   Allergy    Anemia    Anxiety    Chicken pox    Complication of anesthesia    vomitting   Coronary artery disease    Crohn disease ([place])    DVT (deep venous thrombosis) ([place]) 2017   Dyspnea    on excertion   Dysrhythmia 2019   palpitations   GERD (gastroesophageal reflux disease)    Heart disease    Heart murmur    Heart murmur    History of kidney stones    Hyperlipidemia    Hypertension    Inflammatory bowel disease    Migraines    hormonal, puberty   Myocardial infarction ([place])    Phlebitis    PONV (postoperative nausea and vomiting)    Past Surgical History:  Procedure Laterality Date   BREAST BIOPSY Left 07/05/2019   FIBROEPITHELIAL lesion/neg   BREAST CYST ASPIRATION     unsure of side   CARPAL TUNNEL RELEASE Right 12/02/2017   Procedure: CARPAL TUNNEL RELEASE ENDOSCOPIC;  Surgeon: Corky Mull, MD;  Location: [facility] ORS;  Service: Orthopedics;  Laterality: Right;   CORONARY  ANGIOPLASTY WITH STENT PLACEMENT  2017   x 2   EYE SURGERY Bilateral    cataract surgery   GANGLION CYST EXCISION Right 12/02/2017   Procedure: REMOVAL GANGLION OF WRIST;  Surgeon: Corky Mull, MD;  Location: [facility] OR[facility];  Service: Orthopedics;  Laterality: Right;   heart murmur     HYSTEROSCOPY WITH D & C N/A 04/19/2020   Procedure: DILATATION AND CURETTAGE /HYSTEROSCOPY, POSSIBLE  POLYPECTOMY;  Surgeon: Benjaman Kindler, MD;  Location: [facility] OR[facility];  Service: Gynecology;  Laterality: N/A;   TONSILLECTOMY AND ADENOIDECTOMY  1962   TUBAL LIGATION  1993   tubaligation  1990   Family History  Problem Relation Age of Onset   Heart disease Father    Heart disease Brother    Cancer Maternal Aunt    Breast cancer Maternal Aunt    Stroke Mother    Kidney disease Neg Hx    GU problems Neg Hx    Kidney cancer Neg Hx    Social History   Socioeconomic History   Marital status: Single    Spouse name: Not on file   Number of children: Not on file   Years of education: Not on file   Highest education level: Not on file  Occupational History  Not on file  Tobacco Use   Smoking status: Never   Smokeless tobacco: Never  Vaping Use   Vaping Use: Never used  Substance and Sexual Activity   Alcohol use: No    Alcohol/week: 0.0 standard drinks   Drug use: No   Sexual activity: Not on file  Other Topics Concern   Not on file  Social History Narrative   Had 2 kids. Daughter died in 2012-02-01 son living    Former Chartered loss adjuster    Social Determinants of Radio broadcast assistant Strain: Low Risk    Difficulty of Paying Living Expenses: Not hard at all  Food Insecurity: No Food Insecurity   Worried About Charity fundraiser in the Last Year: Never true   Arboriculturist in the Last Year: Never true  Transportation Needs: No Transportation Needs   Lack of Transportation (Medical): No   Lack of Transportation (Non-Medical): No  Physical Activity: Sufficiently Active   Days of Exercise  per Week: 5 days   Minutes of Exercise per Session: 30 min  Stress: No Stress Concern Present   Feeling of Stress : Not at all  Social Connections: Unknown   Frequency of Communication with Friends and Family: More than three times a week   Frequency of Social Gatherings with Friends and Family: More than three times a week   Attends Religious Services: More than 4 times per year   Active Member of [employer] or Organizations: Yes   Attends Music therapist: More than 4 times per year   Marital Status: Not on file     Review of Systems  Constitutional:  Negative for appetite change and unexpected weight change.  HENT:  Negative for congestion and sinus pressure.   Respiratory:  Negative for chest tightness and shortness of breath.        Cough in am.   Cardiovascular:  Negative for chest pain and palpitations.       Compression hose to help with leg swelling.   Gastrointestinal:  Negative for abdominal pain, diarrhea, nausea and vomiting.  Genitourinary:  Negative for difficulty urinating and dysuria.  Musculoskeletal:  Negative for joint swelling and myalgias.  Skin:  Negative for color change and rash.  Neurological:  Negative for dizziness, light-headedness and headaches.  Psychiatric/Behavioral:  Negative for agitation and dysphoric mood.       Objective:     BP 138/76   Pulse 71   Temp 98.2 F (36.8 C)   Resp 16   Ht 5' 2"  (1.575 m)   Wt 167 lb (75.8 kg)   SpO2 98%   BMI 30.54 kg/m  Wt Readings from Last 3 Encounters:  08/01/21 167 lb (75.8 kg)  06/17/21 169 lb 9.6 oz (76.9 kg)  06/11/21 168 lb 14.4 oz (76.6 kg)    Physical Exam Vitals reviewed.  Constitutional:      General: She is not in acute distress.    Appearance: Normal appearance.  HENT:     Head: Normocephalic and atraumatic.     Right Ear: External ear normal.     Left Ear: External ear normal.  Eyes:     General: No scleral icterus.       Right eye: No discharge.        Left eye: No  discharge.     Conjunctiva/sclera: Conjunctivae normal.  Neck:     Thyroid: No thyromegaly.  Cardiovascular:     Rate and Rhythm: Normal rate and  regular rhythm.  Pulmonary:     Effort: No respiratory distress.     Breath sounds: Normal breath sounds. No wheezing.  Abdominal:     General: Bowel sounds are normal.     Palpations: Abdomen is soft.     Tenderness: There is no abdominal tenderness.  Musculoskeletal:        General: No swelling or tenderness.     Cervical back: Neck supple. No tenderness.  Lymphadenopathy:     Cervical: No cervical adenopathy.  Skin:    Findings: No erythema or rash.  Neurological:     Mental Status: She is alert.  Psychiatric:        Mood and Affect: Mood normal.        Behavior: Behavior normal.     Outpatient Encounter Medications as of 08/01/2021  Medication Sig   aspirin EC 81 MG tablet Take 81 mg by mouth daily.   Cholecalciferol 25 MCG (1000 UT) tablet Take 1,000 Units by mouth 2 (two) times daily.   ciclopirox (PENLAC) 8 % solution Apply topically at bedtime. Apply over nail and surrounding skin. Apply daily over previous coat. After seven (7) days, may remove with alcohol and continue cycle.   clopidogrel (PLAVIX) 75 MG tablet Take 75 mg by mouth daily.   cyanocobalamin 1000 MCG tablet Take 1,000 mcg by mouth daily at 12 noon.    diphenhydrAMINE (BENADRYL) 12.5 MG/5ML elixir Take 12.5 mg by mouth every 6 (six) hours as needed (allergic reaction).   EPINEPHrine 0.3 mg/0.3 mL IJ SOAJ injection Inject 0.3 mg into the muscle once.    escitalopram (LEXAPRO) 20 MG tablet TAKE 1 TABLET BY MOUTH ONCE DAILY. (Patient taking differently: Take 20 mg by mouth at bedtime.)   Fe Fum-FePoly-Vit C-Vit B3 (INTEGRA) 62.5-62.5-40-3 MG CAPS TAKE (1) CAPSULE BY MOUTH ONCE DAILY.   Melatonin-Pyridoxine 3-10 MG TABS Take 1 tablet by mouth at bedtime.   mesalamine (LIALDA) 1.2 g EC tablet Take 1.2 g by mouth 3 (three) times daily with meals.    mupirocin ointment  (BACTROBAN) 2 % Apply to affected area bid (Patient taking differently: Apply 1 application topically 2 (two) times daily as needed (rash in head).)   nitroGLYCERIN (NITROSTAT) 0.4 MG SL tablet DISSOLVE (1) TABLET UNDER TONGUE AS NEEDED TO RELIEVE CHEST PAIN. MAYREPEAT EVERY 5 MINUTES.   ondansetron (ZOFRAN-ODT) 4 MG disintegrating tablet Take 4 mg by mouth daily as needed for nausea.    pantoprazole (PROTONIX) 20 MG tablet Take 20 mg by mouth daily.    prednisoLONE acetate (PRED FORTE) 1 % ophthalmic suspension Place 1 drop into both eyes 2 (two) times daily as needed (for inflammation).   rizatriptan (MAXALT) 5 MG tablet USE AS DIRECTED   rosuvastatin (CRESTOR) 5 MG tablet Take 1-2 tablets q day.   verapamil (CALAN-SR) 120 MG CR tablet Take 120 mg by mouth every evening.    [DISCONTINUED] carvedilol (COREG) 3.125 MG tablet Take 1.5625 mg by mouth 2 (two) times daily with a meal. (Patient not taking: Reported on 06/02/2021)   No facility-administered encounter medications on file as of 08/01/2021.     Lab Results  Component Value Date   WBC 12.5 (H) 04/01/2021   HGB 13.7 04/01/2021   HCT 39.3 04/01/2021   PLT 295 04/01/2021   GLUCOSE 155 (H) 04/01/2021   CHOL 174 03/17/2021   TRIG 117.0 03/17/2021   HDL 50.30 03/17/2021   LDLCALC 101 (H) 03/17/2021   ALT 18 03/17/2021   AST 16 03/17/2021  NA 137 04/01/2021   K 3.2 (L) 04/01/2021   CL 102 04/01/2021   CREATININE 1.00 04/01/2021   BUN 19 04/01/2021   CO2 24 04/01/2021   TSH 2.40 11/14/2020   INR 1.0 12/23/2017       Assessment & Plan:   Problem List Items Addressed This Visit     Anemia    Follow cbc.       CAD (coronary artery disease)    Continue crestor and aspirin.  Continue risk factor modification.  Seeing cardiology.       Carotid artery disease ([place])    Continue crestor and aspirin.  Continue risk factor modification.       Crohn disease ([place])    Has been on lialda.  Bowels have been stable.  Follow.        Essential hypertension    Blood pressure as outlined.  Reported off coreg.  Follow pressures.  Follow metabolic panel.       GERD (gastroesophageal reflux disease)    No upper symptoms reported.  On protonix.       History of COVID-19    Feeling better.  Some cough in am, but no increased cough or congestion.  Steroid nasal spray if needed.  Follow.       Hypercholesterolemia    Continue crestor.  Low cholesterol diet and exercise. Follow lipid panel and liver function tests.        Migraine headache    Has seen neurology previously.  No problems reported with headaches currently.  Follow.       Mild depression    Continue lexapro.  Has good support.       Stress    Increased stress.  Appears to be handling things relatively well.  Continue lexapro.       Vitamin D deficiency    Continue vitamin D supplements.          Einar Pheasant, MD

## 2021-08-05 ENCOUNTER — Other Ambulatory Visit: Payer: Self-pay

## 2021-08-05 DIAGNOSIS — Z955 Presence of coronary angioplasty implant and graft: Secondary | ICD-10-CM

## 2021-08-05 NOTE — Progress Notes (Signed)
Daily Session Note  Patient Details  Name: Morgan Moore MRN: [id number] Date of Birth: [birth year] Referring Provider:   Flowsheet Row Cardiac Rehab from 06/17/2021 in [facility] Cardiac and Pulmonary Rehab  Referring Provider Lujean Amel MD       Encounter Date: 08/05/2021  Check In:  Session Check In - 08/05/21 1110       Check-In   Supervising physician immediately available to respond to emergencies See telemetry face sheet for immediately available ER MD    Location [facility]-Cardiac & Pulmonary Rehab    Staff Present Birdie Sons, MPA, RN;Melissa Devon, RDN, LDN;Jessica Luan Pulling, MA, RCEP, CCRP, CCET    Virtual Visit No    Medication changes reported     No    Fall or balance concerns reported    No    Warm-up and Cool-down Performed on first and last piece of equipment    Resistance Training Performed Yes    VAD Patient? No    PAD/SET Patient? No      VAD patient   Has back up controller? No      Pain Assessment   Currently in Pain? No/denies                Social History   Tobacco Use  Smoking Status Never  Smokeless Tobacco Never    Goals Met:  Independence with exercise equipment Exercise tolerated well Personal goals reviewed No report of concerns or symptoms today Strength training completed today  Goals Unmet:  Not Applicable  Comments: Pt able to follow exercise prescription today without complaint.  Will continue to monitor for progression.  Reviewed home exercise with pt today.  Pt plans to walk and use staff videos at home for exercise.  Reviewed THR, pulse, RPE, sign and symptoms, pulse oximetery and when to call 911 or MD.  Also discussed weather considerations and indoor options.  Pt voiced understanding.   Dr. Emily Filbert is Medical Director for [place].  Dr. Ottie Glazier is Medical Director for [facility] Pulmonary Rehabilitation.

## 2021-08-07 ENCOUNTER — Encounter: Payer: Medicare PPO | Admitting: *Deleted

## 2021-08-07 ENCOUNTER — Other Ambulatory Visit: Payer: Self-pay

## 2021-08-07 DIAGNOSIS — Z955 Presence of coronary angioplasty implant and graft: Secondary | ICD-10-CM | POA: Diagnosis not present

## 2021-08-07 NOTE — Progress Notes (Signed)
Daily Session Note  Patient Details  Name: Oma Marzan MRN: [id number] Date of Birth: 1955-11-25 Referring Provider:   Flowsheet Row Cardiac Rehab from 06/17/2021 in [facility] Cardiac and Pulmonary Rehab  Referring Provider Lujean Amel MD       Encounter Date: 08/07/2021  Check In:      Social History   Tobacco Use  Smoking Status Never  Smokeless Tobacco Never    Goals Met:  Independence with exercise equipment Exercise tolerated well No report of concerns or symptoms today  Goals Unmet:  Not Applicable  Comments: Pt able to follow exercise prescription today without complaint.  Will continue to monitor for progression.    Dr. Emily Filbert is Medical Director for [place].  Dr. Ottie Glazier is Medical Director for [facility] Pulmonary Rehabilitation.

## 2021-08-09 ENCOUNTER — Encounter: Payer: Self-pay | Admitting: Internal Medicine

## 2021-08-09 DIAGNOSIS — Z8616 Personal history of COVID-19: Secondary | ICD-10-CM | POA: Insufficient documentation

## 2021-08-09 NOTE — Assessment & Plan Note (Signed)
Continue crestor and aspirin.  Continue risk factor modification.  Seeing cardiology.

## 2021-08-09 NOTE — Assessment & Plan Note (Addendum)
Continue crestor and aspirin.  Continue risk factor modification.

## 2021-08-09 NOTE — Assessment & Plan Note (Signed)
Continue lexapro.  Has good support.

## 2021-08-09 NOTE — Assessment & Plan Note (Signed)
Blood pressure as outlined.  Reported off coreg.  Follow pressures.  Follow metabolic panel.

## 2021-08-09 NOTE — Assessment & Plan Note (Signed)
Has been on lialda.  Bowels have been stable.  Follow.

## 2021-08-09 NOTE — Assessment & Plan Note (Signed)
Increased stress.  Appears to be handling things relatively well.  Continue lexapro.

## 2021-08-09 NOTE — Assessment & Plan Note (Signed)
Feeling better.  Some cough in am, but no increased cough or congestion.  Steroid nasal spray if needed.  Follow.

## 2021-08-09 NOTE — Assessment & Plan Note (Signed)
Follow cbc.  

## 2021-08-09 NOTE — Assessment & Plan Note (Signed)
Has seen neurology previously.  No problems reported with headaches currently.  Follow.

## 2021-08-09 NOTE — Assessment & Plan Note (Signed)
No upper symptoms reported.  On protonix.

## 2021-08-09 NOTE — Assessment & Plan Note (Signed)
Continue crestor.  Low cholesterol diet and exercise. Follow lipid panel and liver function tests.

## 2021-08-09 NOTE — Assessment & Plan Note (Signed)
Continue vitamin D supplements.

## 2021-08-12 ENCOUNTER — Encounter: Payer: Medicare PPO | Attending: Internal Medicine

## 2021-08-12 ENCOUNTER — Other Ambulatory Visit: Payer: Self-pay

## 2021-08-12 DIAGNOSIS — Z955 Presence of coronary angioplasty implant and graft: Secondary | ICD-10-CM | POA: Insufficient documentation

## 2021-08-12 NOTE — Progress Notes (Signed)
Daily Session Note  Patient Details  Name: Morgan Moore MRN: [id number] Date of Birth: [birth year] Referring Provider:   Flowsheet Row Cardiac Rehab from 06/17/2021 in [facility] Cardiac and Pulmonary Rehab  Referring Provider Lujean Amel MD       Encounter Date: 08/12/2021  Check In:  Session Check In - 08/12/21 1104       Check-In   Supervising physician immediately available to respond to emergencies See telemetry face sheet for immediately available ER MD    Location [facility]-Cardiac & Pulmonary Rehab    Staff Present Birdie Sons, MPA, RN;Jessica [place]ott, MA, RCEP, CCRP, CCET;Melissa Cornucopia, RDN, Rowe Pavy, BA, ACSM CEP, Exercise Physiologist    Virtual Visit No    Medication changes reported     No    Fall or balance concerns reported    No    Tobacco Cessation No Change    Warm-up and Cool-down Performed on first and last piece of equipment    Resistance Training Performed Yes    VAD Patient? No    PAD/SET Patient? No      Pain Assessment   Currently in Pain? No/denies                Social History   Tobacco Use  Smoking Status Never  Smokeless Tobacco Never    Goals Met:  Independence with exercise equipment Exercise tolerated well No report of concerns or symptoms today Strength training completed today  Goals Unmet:  Not Applicable  Comments: Pt able to follow exercise prescription today without complaint.  Will continue to monitor for progression.    Dr. Emily Filbert is Medical Director for [place].  Dr. Ottie Glazier is Medical Director for [facility] Pulmonary Rehabilitation.

## 2021-08-13 ENCOUNTER — Encounter: Payer: Self-pay | Admitting: *Deleted

## 2021-08-13 DIAGNOSIS — Z955 Presence of coronary angioplasty implant and graft: Secondary | ICD-10-CM

## 2021-08-13 NOTE — Progress Notes (Signed)
Cardiac Individual Treatment Plan  Patient Details  Name: Nalah Macioce MRN: [id number] Date of Birth: November 06, 1956 Referring Provider:   Flowsheet Row Cardiac Rehab from 06/17/2021 in [facility] Cardiac and Pulmonary Rehab  Referring Provider Lujean Amel MD       Initial Encounter Date:  Flowsheet Row Cardiac Rehab from 06/17/2021 in [facility] Cardiac and Pulmonary Rehab  Date 06/17/21       Visit Diagnosis: Status post coronary artery stent placement  Patient's Home Medications on Admission:  Current Outpatient Medications:    aspirin EC 81 MG tablet, Take 81 mg by mouth daily., Disp: , Rfl:    Cholecalciferol 25 MCG (1000 UT) tablet, Take 1,000 Units by mouth 2 (two) times daily., Disp: , Rfl:    ciclopirox (PENLAC) 8 % solution, Apply topically at bedtime. Apply over nail and surrounding skin. Apply daily over previous coat. After seven (7) days, may remove with alcohol and continue cycle., Disp: 6.6 mL, Rfl: 1   clopidogrel (PLAVIX) 75 MG tablet, Take 75 mg by mouth daily., Disp: , Rfl:    cyanocobalamin 1000 MCG tablet, Take 1,000 mcg by mouth daily at 12 noon. , Disp: , Rfl:    diphenhydrAMINE (BENADRYL) 12.5 MG/5ML elixir, Take 12.5 mg by mouth every 6 (six) hours as needed (allergic reaction)., Disp: , Rfl:    EPINEPHrine 0.3 mg/0.3 mL IJ SOAJ injection, Inject 0.3 mg into the muscle once. , Disp: , Rfl:    escitalopram (LEXAPRO) 20 MG tablet, TAKE 1 TABLET BY MOUTH ONCE DAILY. (Patient taking differently: Take 20 mg by mouth at bedtime.), Disp: 90 tablet, Rfl: 0   Fe Fum-FePoly-Vit C-Vit B3 (INTEGRA) 62.5-62.5-40-3 MG CAPS, TAKE (1) CAPSULE BY MOUTH ONCE DAILY., Disp: 90 capsule, Rfl: 0   Melatonin-Pyridoxine 3-10 MG TABS, Take 1 tablet by mouth at bedtime., Disp: , Rfl:    mesalamine (LIALDA) 1.2 g EC tablet, Take 1.2 g by mouth 3 (three) times daily with meals. , Disp: , Rfl:    mupirocin ointment (BACTROBAN) 2 %, Apply to affected area bid (Patient taking differently: Apply  1 application topically 2 (two) times daily as needed (rash in head).), Disp: 22 g, Rfl: 0   nitroGLYCERIN (NITROSTAT) 0.4 MG SL tablet, DISSOLVE (1) TABLET UNDER TONGUE AS NEEDED TO RELIEVE CHEST PAIN. MAYREPEAT EVERY 5 MINUTES., Disp: 25 tablet, Rfl: 0   ondansetron (ZOFRAN-ODT) 4 MG disintegrating tablet, Take 4 mg by mouth daily as needed for nausea. , Disp: , Rfl:    pantoprazole (PROTONIX) 20 MG tablet, Take 20 mg by mouth daily. , Disp: , Rfl:    prednisoLONE acetate (PRED FORTE) 1 % ophthalmic suspension, Place 1 drop into both eyes 2 (two) times daily as needed (for inflammation)., Disp: , Rfl:    rizatriptan (MAXALT) 5 MG tablet, USE AS DIRECTED, Disp: 10 tablet, Rfl: 0   rosuvastatin (CRESTOR) 5 MG tablet, Take 1-2 tablets q day., Disp: 60 tablet, Rfl: 4   verapamil (CALAN-SR) 120 MG CR tablet, Take 120 mg by mouth every evening. , Disp: , Rfl:   Past Medical History: Past Medical History:  Diagnosis Date   Allergy    Anemia    Anxiety    Chicken pox    Complication of anesthesia    vomitting   Coronary artery disease    Crohn disease ([place])    DVT (deep venous thrombosis) ([place]) 2017   Dyspnea    on excertion   Dysrhythmia 2019   palpitations   GERD (gastroesophageal reflux disease)  Heart disease    Heart murmur    Heart murmur    History of kidney stones    Hyperlipidemia    Hypertension    Inflammatory bowel disease    Migraines    hormonal, puberty   Myocardial infarction ([place])    Phlebitis    PONV (postoperative nausea and vomiting)     Tobacco Use: Social History   Tobacco Use  Smoking Status Never  Smokeless Tobacco Never    Labs: Recent Review Flowsheet Data     Labs for ITP Cardiac and Pulmonary Rehab Latest Ref Rng & Units 10/23/2019 03/11/2020 07/16/2020 11/14/2020 03/17/2021   Cholestrol 0 - 200 mg/dL 167 193 180 172 174   LDLCALC 0 - 99 mg/dL 97 114(H) 104(H) 102(H) 101(H)   HDL >39.00 mg/dL 48.00 48.70 45.10 40.80 50.30   Trlycerides 0.0 -  149.0 mg/dL 111.0 153.0(H) 156.0(H) 146.0 117.0        Exercise Target Goals: Exercise Program Goal: Individual exercise prescription set using results from initial 6 min walk test and THRR while considering  patient's activity barriers and safety.   Exercise Prescription Goal: Initial exercise prescription builds to 30-45 minutes a day of aerobic activity, 2-3 days per week.  Home exercise guidelines will be given to patient during program as part of exercise prescription that the participant will acknowledge.   Education: Aerobic Exercise: - Group verbal and visual presentation on the components of exercise prescription. Introduces F.I.T.T principle from ACSM for exercise prescriptions.  Reviews F.I.T.T. principles of aerobic exercise including progression. Written material given at graduation. Flowsheet Row Cardiac Rehab from 09/14/2016 in [facility] Cardiac and Pulmonary Rehab  Date 08/12/16  Educator [place]  Instruction Review Code (retired) 2- meets goals/outcomes       Education: Resistance Exercise: - Group verbal and visual presentation on the components of exercise prescription. Introduces F.I.T.T principle from ACSM for exercise prescriptions  Reviews F.I.T.T. principles of resistance exercise including progression. Written material given at graduation.    Education: Exercise & Equipment Safety: - Individual verbal instruction and demonstration of equipment use and safety with use of the equipment. Flowsheet Row Cardiac Rehab from 06/11/2021 in [facility] Cardiac and Pulmonary Rehab  Education need identified 06/11/21  Date 06/11/21  Educator [place]  Instruction Review Code 1- Verbalizes Understanding       Education: Exercise Physiology & General Exercise Guidelines: - Group verbal and written instruction with models to review the exercise physiology of the cardiovascular system and associated critical values. Provides general exercise guidelines with specific guidelines to those with  heart or lung disease.  Flowsheet Row Cardiac Rehab from 09/14/2016 in [facility] Cardiac and Pulmonary Rehab  Date 08/10/16  Educator [facility]  Instruction Review Code (retired) 2- meets goals/outcomes       Education: Flexibility, Insurance underwriter, Mind/Body Relaxation: - Group verbal and visual presentation with interactive activity on the components of exercise prescription. Introduces F.I.T.T principle from ACSM for exercise prescriptions. Reviews F.I.T.T. principles of flexibility and balance exercise training including progression. Also discusses the mind body connection.  Reviews various relaxation techniques to help reduce and manage stress (i.e. Deep breathing, progressive muscle relaxation, and visualization). Balance handout provided to take home. Written material given at graduation. Flowsheet Row Cardiac Rehab from 09/14/2016 in [facility] Cardiac and Pulmonary Rehab  Date 08/17/16  Educator [facility]  Instruction Review Code (retired) 2- meets goals/outcomes       Activity Barriers & Risk Stratification:  Activity Barriers & Cardiac Risk Stratification - 06/11/21 1224  Activity Barriers & Cardiac Risk Stratification   Activity Barriers Muscular Weakness;Deconditioning;Other (comment)    Comments Limited ROM moving arms with flexion    Cardiac Risk Stratification High             6 Minute Walk:  6 Minute Walk     Row Name 06/17/21 1113         6 Minute Walk   Phase Initial     Distance 1275 feet     Walk Time 6 minutes     # of Rest Breaks 0     MPH 2.41     METS 2.79     RPE 11     Perceived Dyspnea  1     VO2 Peak 9.76     Symptoms Yes (comment)     Comments SOB     Resting HR 64 bpm     Resting BP 134/66     Resting Oxygen Saturation  96 %     Exercise Oxygen Saturation  during 6 min walk 96 %     Max Ex. HR 94 bpm     Max Ex. BP 138/74     2 Minute Post BP 118/60              Oxygen Initial Assessment:   Oxygen Re-Evaluation:   Oxygen Discharge (Final  Oxygen Re-Evaluation):   Initial Exercise Prescription:  Initial Exercise Prescription - 06/17/21 1100       Date of Initial Exercise RX and Referring Provider   Date 06/17/21    Referring Provider Lujean Amel MD      Treadmill   MPH 2.3    Grade 0.5    Minutes 15    METs 2.92      NuStep   Level 2    SPM 80    Minutes 15    METs 2.7      Biostep-RELP   Level 2    SPM 50    Minutes 15    METs 2      Prescription Details   Frequency (times per week) 2    Duration Progress to 30 minutes of continuous aerobic without signs/symptoms of physical distress      Intensity   THRR 40-80% of Max Heartrate 100-136    Ratings of Perceived Exertion 11-13    Perceived Dyspnea 0-4      Progression   Progression Continue to progress workloads to maintain intensity without signs/symptoms of physical distress.      Resistance Training   Training Prescription Yes    Weight 3 lb    Reps 10-15             Perform Capillary Blood Glucose checks as needed.  Exercise Prescription Changes:   Exercise Prescription Changes     Row Name 06/17/21 1100 06/30/21 1400 07/28/21 1500 08/05/21 1100 08/11/21 1100     Response to Exercise   Blood Pressure (Admit) 134/66 120/60 122/60 -- 128/66   Blood Pressure (Exercise) 138/74 122/64 128/64 -- --   Blood Pressure (Exit) 118/60 118/62 124/64 -- 120/62   Heart Rate (Admit) 64 bpm 72 bpm 70 bpm -- 89 bpm   Heart Rate (Exercise) 94 bpm 93 bpm 103 bpm -- 99 bpm   Heart Rate (Exit) 71 bpm 74 bpm 73 bpm -- 67 bpm   Oxygen Saturation (Admit) 96 % -- -- -- --   Oxygen Saturation (Exercise) 96 % -- -- -- --   Rating of Perceived  Exertion (Exercise) 11 12 13  -- 13   Perceived Dyspnea (Exercise) 1 -- -- -- --   Symptoms SOB none none -- none   Comments walk test results second day -- -- --   Duration -- Progress to 30 minutes of  aerobic without signs/symptoms of physical distress Continue with 30 min of aerobic exercise without  signs/symptoms of physical distress. -- Continue with 30 min of aerobic exercise without signs/symptoms of physical distress.   Intensity -- THRR unchanged THRR unchanged -- THRR unchanged     Progression   Progression -- Continue to progress workloads to maintain intensity without signs/symptoms of physical distress. Continue to progress workloads to maintain intensity without signs/symptoms of physical distress. -- Continue to progress workloads to maintain intensity without signs/symptoms of physical distress.   Average METs -- 2.7 2.5 -- 2.91     Resistance Training   Training Prescription -- Yes Yes -- Yes   Weight -- 2 lb 2 lb -- 2 lb   Reps -- 10-15 10-15 -- 10-15     Interval Training   Interval Training -- -- -- -- No     Treadmill   MPH -- 2.3 -- -- 2.5   Grade -- 0.5 -- -- 0.5   Minutes -- 15 -- -- 15   METs -- 2.92 -- -- 3.09     NuStep   Level -- 2 4 -- 4   Minutes -- 15 15 -- 15   METs -- 2.5 2 -- 2.9     Track   Laps -- -- 28 -- --   Minutes -- -- 15 -- --   METs -- -- 2.5 -- --     Home Exercise Plan   Plans to continue exercise at -- -- -- Home (comment)  walking, staff videos Home (comment)  walking, staff videos   Frequency -- -- -- Add 2 additional days to program exercise sessions. Add 2 additional days to program exercise sessions.   Initial Home Exercises Provided -- -- -- 08/05/21 08/05/21     Oxygen   Maintain Oxygen Saturation -- -- -- -- 88% or higher            Exercise Comments:   Exercise Comments     Row Name 06/17/21 1101           Exercise Comments First full day of exercise!  Patient was oriented to gym and equipment including functions, settings, policies, and procedures.  Patient's individual exercise prescription and treatment plan were reviewed.  All starting workloads were established based on the results of the 6 minute walk test done at initial orientation visit.  The plan for exercise progression was also introduced and  progression will be customized based on patient's performance and goals.                Exercise Goals and Review:   Exercise Goals     Row Name 06/17/21 1118             Exercise Goals   Increase Physical Activity Yes       Intervention Provide advice, education, support and counseling about physical activity/exercise needs.;Develop an individualized exercise prescription for aerobic and resistive training based on initial evaluation findings, risk stratification, comorbidities and participant's personal goals.       Expected Outcomes Short Term: Attend rehab on a regular basis to increase amount of physical activity.;Long Term: Add in home exercise to make exercise part of routine and to increase  amount of physical activity.;Long Term: Exercising regularly at least 3-5 days a week.       Increase Strength and Stamina Yes       Intervention Provide advice, education, support and counseling about physical activity/exercise needs.;Develop an individualized exercise prescription for aerobic and resistive training based on initial evaluation findings, risk stratification, comorbidities and participant's personal goals.       Expected Outcomes Short Term: Increase workloads from initial exercise prescription for resistance, speed, and METs.;Short Term: Perform resistance training exercises routinely during rehab and add in resistance training at home;Long Term: Improve cardiorespiratory fitness, muscular endurance and strength as measured by increased METs and functional capacity (6MWT)       Able to understand and use rate of perceived exertion (RPE) scale Yes       Intervention Provide education and explanation on how to use RPE scale       Expected Outcomes Short Term: Able to use RPE daily in rehab to express subjective intensity level;Long Term:  Able to use RPE to guide intensity level when exercising independently       Able to understand and use Dyspnea scale Yes       Intervention  Provide education and explanation on how to use Dyspnea scale       Expected Outcomes Short Term: Able to use Dyspnea scale daily in rehab to express subjective sense of shortness of breath during exertion;Long Term: Able to use Dyspnea scale to guide intensity level when exercising independently       Knowledge and understanding of Target Heart Rate Range (THRR) Yes       Intervention Provide education and explanation of THRR including how the numbers were predicted and where they are located for reference       Expected Outcomes Short Term: Able to state/look up THRR;Short Term: Able to use daily as guideline for intensity in rehab;Long Term: Able to use THRR to govern intensity when exercising independently       Able to check pulse independently Yes       Intervention Provide education and demonstration on how to check pulse in carotid and radial arteries.;Review the importance of being able to check your own pulse for safety during independent exercise       Expected Outcomes Short Term: Able to explain why pulse checking is important during independent exercise;Long Term: Able to check pulse independently and accurately       Understanding of Exercise Prescription Yes       Intervention Provide education, explanation, and written materials on patient's individual exercise prescription       Expected Outcomes Long Term: Able to explain home exercise prescription to exercise independently;Short Term: Able to explain program exercise prescription                Exercise Goals Re-Evaluation :  Exercise Goals Re-Evaluation     [place] Name 06/17/21 1141 06/30/21 1403 07/16/21 1520 07/28/21 1556 08/05/21 1136     Exercise Goal Re-Evaluation   Exercise Goals Review Able to understand and use rate of perceived exertion (RPE) scale;Increase Physical Activity;Knowledge and understanding of Target Heart Rate Range (THRR);Understanding of Exercise Prescription;Increase Strength and Stamina;Able to  understand and use Dyspnea scale;Able to check pulse independently Increase Physical Activity;Increase Strength and Stamina -- Increase Physical Activity;Increase Strength and Stamina Increase Physical Activity;Increase Strength and Stamina;Understanding of Exercise Prescription   Comments Reviewed RPE and dyspnea scales, THR and program prescription with pt today.  Pt voiced understanding and  was given a copy of goals to take home. Amrie has done well in her first couple sessions.  We will monitor progress when she returns to class. (out until 8/29/) Out since last review with COVID Klare is just returning after being out with Covid.  She is working to build back stamina after being out for 3 weeks. Jacarra is doing well in rehab. She is staying active at home with yard work and working around [employer] but she is not doing any formal exercise. We talked about trying to work it into her schedule for mental boost.  Reviewed home exercise with pt today.  Pt plans to walk and use staff videos at home for exercise.  Reviewed THR, pulse, RPE, sign and symptoms, pulse oximetery and when to call 911 or MD.  Also discussed weather considerations and indoor options.  Pt voiced understanding.   Expected Outcomes Short: Use RPE daily to regulate intensity. Long: Follow program prescription in THR. Short:  attend consistently Long: improve overall stamina -- Short: get back to consistent attendance Long:  improve stamina Short: Try to add in more exercise at home Long: Make time to exercise    Row Name 08/11/21 1156             Exercise Goal Re-Evaluation   Exercise Goals Review Increase Physical Activity;Increase Strength and Stamina;Understanding of Exercise Prescription       Comments Rayma is getting back into the swing of things in rehab since recently being out from sickness. She has increased her speed up to 2.5 on the treadmill and RPEs are staying in appropriate range. Will continue to monitor.       Expected  Outcomes Short: Continue to build up loads on treadmill Long: Increase overall MET level                Discharge Exercise Prescription (Final Exercise Prescription Changes):  Exercise Prescription Changes - 08/11/21 1100       Response to Exercise   Blood Pressure (Admit) 128/66    Blood Pressure (Exit) 120/62    Heart Rate (Admit) 89 bpm    Heart Rate (Exercise) 99 bpm    Heart Rate (Exit) 67 bpm    Rating of Perceived Exertion (Exercise) 13    Symptoms none    Duration Continue with 30 min of aerobic exercise without signs/symptoms of physical distress.    Intensity THRR unchanged      Progression   Progression Continue to progress workloads to maintain intensity without signs/symptoms of physical distress.    Average METs 2.91      Resistance Training   Training Prescription Yes    Weight 2 lb    Reps 10-15      Interval Training   Interval Training No      Treadmill   MPH 2.5    Grade 0.5    Minutes 15    METs 3.09      NuStep   Level 4    Minutes 15    METs 2.9      Home Exercise Plan   Plans to continue exercise at Home (comment)   walking, staff videos   Frequency Add 2 additional days to program exercise sessions.    Initial Home Exercises Provided 08/05/21      Oxygen   Maintain Oxygen Saturation 88% or higher             Nutrition:  Target Goals: Understanding of nutrition guidelines, daily intake of sodium <  1552m, cholesterol <2084m calories 30% from fat and 7% or less from saturated fats, daily to have 5 or more servings of fruits and vegetables.  Education: All About Nutrition: -Group instruction provided by verbal, written material, interactive activities, discussions, models, and posters to present general guidelines for heart healthy nutrition including fat, fiber, MyPlate, the role of sodium in heart healthy nutrition, utilization of the nutrition label, and utilization of this knowledge for meal planning. Follow up email sent as  well. Written material given at graduation. [facility]  Date 07/27/16  Educator P. InLeonel RamsayRDParadiseInstruction Review Code (retired) 2- meets goals/outcomes       Biometrics:  Pre Biometrics - 06/17/21 1117       Pre Biometrics   Height 5' 2"  (1.575 m)    Weight 169 lb 9.6 oz (76.9 kg)    BMI (Calculated) 31.01    Single Leg Stand 9.7 seconds              Nutrition Therapy Plan and Nutrition Goals:  Nutrition Therapy & Goals - 06/19/21 1150       Nutrition Therapy   RD appointment deferred Yes   She would like to defer for a couple of weeks and then decide as she has a lot going on right now. She would also like to be re-evalutated by the GI MD - she has Crohns and has recently been diagnosed with diverticulosis            Nutrition Assessments:  MEDIFICTS Score Key: ?70 Need to make dietary changes  40-70 Heart Healthy Diet ? 40 Therapeutic Level Cholesterol Diet  [facility]  Picture Your Plate Total Score on Admission 57      Picture Your Plate Scores: <4<47nhealthy dietary pattern with much room for improvement. 41-50 Dietary pattern unlikely to meet recommendations for good health and room for improvement. 51-60 More healthful dietary pattern, with some room for improvement.  >60 Healthy dietary pattern, although there may be some specific behaviors that could be improved.    Nutrition Goals Re-Evaluation:  Nutrition Goals Re-Evaluation     [place] 08/05/21 1124             Goals   Nutrition Goal Eating heart healthy       Comment KaRielleontinues to defer meeting with dietitian.  She has been eating healthy and focus       Expected Outcome Continue to eat healthy                Nutrition Goals Discharge (Final Nutrition Goals Re-Evaluation):  Nutrition Goals Re-Evaluation - 08/05/21 1124       Goals    Nutrition Goal Eating heart healthy    Comment KaFenixontinues to defer meeting with dietitian.  She has been eating healthy and focus    Expected Outcome Continue to eat healthy             Psychosocial: Target Goals: Acknowledge presence or absence of significant depression and/or stress, maximize coping skills, provide positive support system. Participant is able to verbalize types and ability to use techniques and skills needed for reducing stress and depression.   Education: Stress, Anxiety, and Depression - Group verbal and visual presentation to define topics covered.  Reviews how body is impacted by stress, anxiety, and depression.  Also discusses healthy ways to reduce stress and  to treat/manage anxiety and depression.  Written material given at graduation. Flowsheet Row Cardiac Rehab from 09/14/2016 in [facility] Cardiac and Pulmonary Rehab  Date 07/29/16  Educator [facility]  Instruction Review Code (retired) 2- meets goals/outcomes       Education: Sleep Hygiene -Provides group verbal and written instruction about how sleep can affect your health.  Define sleep hygiene, discuss sleep cycles and impact of sleep habits. Review good sleep hygiene tips.    Initial Review & Psychosocial Screening:  Initial Psych Review & Screening - 06/02/21 1427       Initial Review   Current issues with Current Stress Concerns    Source of Stress Concerns Unable to perform yard/household activities      [place]? Yes   son (lives out of town), close by family, friends and church family     Barriers   Psychosocial barriers to participate in program There are no identifiable barriers or psychosocial needs.;The patient should benefit from training in stress management and relaxation.      Screening Interventions   Interventions Encouraged to exercise;Provide feedback about the scores to participant;To provide support and resources with identified psychosocial needs     Expected Outcomes Short Term goal: Utilizing psychosocial counselor, staff and physician to assist with identification of specific Stressors or current issues interfering with healing process. Setting desired goal for each stressor or current issue identified.;Long Term Goal: Stressors or current issues are controlled or eliminated.;Short Term goal: Identification and review with participant of any Quality of Life or Depression concerns found by scoring the questionnaire.;Long Term goal: The participant improves quality of Life and PHQ9 Scores as seen by post scores and/or verbalization of changes             Quality of Life Scores:   Quality of Life - 06/11/21 1233       Quality of Life Scores   Health/Function Pre 25.14 %    Socioeconomic Pre 28.57 %    Psych/Spiritual Pre 25.64 %    Family Pre 26.88 %    GLOBAL Pre 26.22 %            Scores of 19 and below usually indicate a poorer quality of life in these areas.  A difference of  2-3 points is a clinically meaningful difference.  A difference of 2-3 points in the total score of the Quality of Life Index has been associated with significant improvement in overall quality of life, self-image, physical symptoms, and general health in studies assessing change in quality of life.  PHQ-9: Recent Review Flowsheet Data     Depression screen [facility] 2/9 06/11/2021 04/08/2021 03/19/2021 11/29/2020 07/18/2020   Decreased Interest 0 0 0 0 0   Down, Depressed, Hopeless 0 0 0 0 0   PHQ - 2 Score 0 0 0 0 0   Altered sleeping 0 0 0 0 -   Tired, decreased energy 0 0 1 0 -   Change in appetite 0 0 0 0 -   Feeling bad or failure about yourself  0 0 0 0 -   Trouble concentrating 0 0 0 0 -   Moving slowly or fidgety/restless 0 0 0 0 -   Suicidal thoughts 0 - - 0 -   PHQ-9 Score 0 0 1 0 -   Difficult doing work/chores Not difficult at all Not difficult at all Not difficult at all Not difficult at all -      Interpretation  of Total Score  Total Score  Depression Severity:  1-4 = Minimal depression, 5-9 = Mild depression, 10-14 = Moderate depression, 15-19 = Moderately severe depression, 20-27 = Severe depression   Psychosocial Evaluation and Intervention:  Psychosocial Evaluation - 06/02/21 1429       Psychosocial Evaluation & Interventions   Interventions Encouraged to exercise with the program and follow exercise prescription    Comments Ms. Heinle is returning to cardiac rehab (was here in 2017) after receiving two stents. She states her stamina needs improving and is hoping this program will help with that. She feels confident in her healthy lifestyle management skills, but needs some encouragement and guidance on increasing her stamina. She lives by herself and does all her own housework/yardwork. 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 She does have a son that lives out of town, is surrounded by family and friends and is very involved in her church family. She states she feels blessed to be surrounded by such an amazing support system, especially after dealing with the passing of her daughter in a car accident years ago and a lot of her family passing due to heart disease.    Expected Outcomes Short: attend cardiac rehab for education and exercise. Long: develop and maintain positive self care habits    Continue Psychosocial Services  Follow up required by staff             Psychosocial Re-Evaluation:  Psychosocial Re-Evaluation     [place] Name 08/05/21 1132             Psychosocial Re-Evaluation   Current issues with Current Depression;Current Stress Concerns       Comments Tayllor is doing well in rehab.  This week is the anniversary of her daughter's accident.  She is strugglingly emotionally but we talked about how much she is doing in her honor to make sure her name lives on. She is also working on helping to organize her church moving back into their [place].  She  is also getting frustrated with not being able to bounce back and recover like she used to and gets upset that she cannot get every thing done.       Expected Outcomes Short: Make it through this week Long: continue to cope the best way she can.       Interventions Encouraged to attend Cardiac Rehabilitation for the exercise       Continue Psychosocial Services  Follow up required by staff                Psychosocial Discharge (Final Psychosocial Re-Evaluation):  Psychosocial Re-Evaluation - 08/05/21 1132       Psychosocial Re-Evaluation   Current issues with Current Depression;Current Stress Concerns    Comments Elanna is doing well in rehab.  This week is the anniversary of her daughter's accident.  She is strugglingly emotionally but we talked about how much she is doing in her honor to make sure her name lives on. She is also working on helping to organize her church moving back into their [place].  She is also getting frustrated with not being able to bounce back and recover like she used to and gets upset that she cannot get every thing done.    Expected Outcomes Short: Make it through this week Long: continue to cope the best way she can.    Interventions Encouraged to attend Cardiac Rehabilitation for the exercise  Continue Psychosocial Services  Follow up required by staff             Vocational Rehabilitation: Provide vocational rehab assistance to qualifying candidates.   Vocational Rehab Evaluation & Intervention:  Vocational Rehab - 06/02/21 1427       Initial Vocational Rehab Evaluation & Intervention   Assessment shows need for Vocational Rehabilitation No             Education: Education Goals: Education classes will be provided on a variety of topics geared toward better understanding of heart health and risk factor modification. Participant will state understanding/return demonstration of topics presented as noted by education test  scores.  Learning Barriers/Preferences:  Learning Barriers/Preferences - 06/02/21 1426       Learning Barriers/Preferences   Learning Barriers None    Learning Preferences None             General Cardiac Education Topics:  AED/CPR: - Group verbal and written instruction with the use of models to demonstrate the basic use of the AED with the basic ABC's of resuscitation.   Anatomy and Cardiac Procedures: - Group verbal and visual presentation and models provide information about basic cardiac anatomy and function. Reviews the testing methods done to diagnose heart disease and the outcomes of the test results. Describes the treatment choices: Medical Management, Angioplasty, or Coronary Bypass Surgery for treating various heart conditions including Myocardial Infarction, Angina, Valve Disease, and Cardiac Arrhythmias.  Written material given at graduation. Flowsheet Row Cardiac Rehab from 09/14/2016 in [facility] Cardiac and Pulmonary Rehab  Date 08/24/16  Educator CE  Instruction Review Code (retired) 2- meets goals/outcomes       Medication Safety: - Group verbal and visual instruction to review commonly prescribed medications for heart and lung disease. Reviews the medication, class of the drug, and side effects. Includes the steps to properly store meds and maintain the prescription regimen.  Written material given at graduation. Flowsheet Row Cardiac Rehab from 09/14/2016 in [facility] Cardiac and Pulmonary Rehab  Date 09/07/16  Educator SB  Instruction Review Code (retired) 2- meets goals/outcomes       Intimacy: - Group verbal instruction through game format to discuss how heart and lung disease can affect sexual intimacy. Written material given at graduation.. Flowsheet Row Cardiac Rehab from 09/14/2016 in [facility] Cardiac and Pulmonary Rehab  Date 09/14/16  Educator [place]  Instruction Review Code (retired) 2- meets goals/outcomes       Know Your Numbers and Heart  Failure: - Group verbal and visual instruction to discuss disease risk factors for cardiac and pulmonary disease and treatment options.  Reviews associated critical values for Overweight/Obesity, Hypertension, Cholesterol, and Diabetes.  Discusses basics of heart failure: signs/symptoms and treatments.  Introduces Heart Failure Zone chart for action plan for heart failure.  Written material given at graduation. Flowsheet Row Cardiac Rehab from 09/14/2016 in [facility] Cardiac and Pulmonary Rehab  Date 08/24/16  Educator CE  Instruction Review Code (retired) 2- meets goals/outcomes       Infection Prevention: - Provides verbal and written material to individual with discussion of infection control including proper hand washing and proper equipment cleaning during exercise session. Flowsheet Row Cardiac Rehab from 06/11/2021 in [facility] Cardiac and Pulmonary Rehab  Education need identified 06/11/21  Date 06/11/21  Educator [place]  Instruction Review Code 1- Verbalizes Understanding       Falls Prevention: - Provides verbal and written material to individual with discussion of falls prevention and safety. Flowsheet Row Cardiac  Rehab from 06/11/2021 in [facility] Cardiac and Pulmonary Rehab  Education need identified 06/11/21  Date 06/11/21  Educator [place]  Instruction Review Code 1- Verbalizes Understanding       Other: -Provides group and verbal instruction on various topics (see comments)   Knowledge Questionnaire Score:  Knowledge Questionnaire Score - 06/11/21 1232       Knowledge Questionnaire Score   Pre Score 25/26: Exercise             Core Components/Risk Factors/Patient Goals at Admission:  Personal Goals and Risk Factors at Admission - 06/17/21 1119       Core Components/Risk Factors/Patient Goals on Admission    Weight Management Yes;Obesity;Weight Loss    Intervention Weight Management: Develop a combined nutrition and exercise program designed to reach desired caloric intake,  while maintaining appropriate intake of nutrient and fiber, sodium and fats, and appropriate energy expenditure required for the weight goal.;Weight Management: Provide education and appropriate resources to help participant work on and attain dietary goals.;Weight Management/Obesity: Establish reasonable short term and long term weight goals.;Obesity: Provide education and appropriate resources to help participant work on and attain dietary goals.    Admit Weight 169 lb 9.6 oz (76.9 kg)    Goal Weight: Short Term 164 lb (74.4 kg)    Goal Weight: Long Term 160 lb (72.6 kg)    Expected Outcomes Short Term: Continue to assess and modify interventions until short term weight is achieved;Long Term: Adherence to nutrition and physical activity/exercise program aimed toward attainment of established weight goal;Weight Loss: Understanding of general recommendations for a balanced deficit meal plan, which promotes 1-2 lb weight loss per week and includes a negative energy balance of [phone] kcal/d;Understanding recommendations for meals to include 15-35% energy as protein, 25-35% energy from fat, 35-60% energy from carbohydrates, less than 290m of dietary cholesterol, 20-35 gm of total fiber daily;Understanding of distribution of calorie intake throughout the day with the consumption of 4-5 meals/snacks    Hypertension Yes    Intervention Provide education on lifestyle modifcations including regular physical activity/exercise, weight management, moderate sodium restriction and increased consumption of fresh fruit, vegetables, and low fat dairy, alcohol moderation, and smoking cessation.;Monitor prescription use compliance.    Expected Outcomes Short Term: Continued assessment and intervention until BP is < 140/960mHG in hypertensive participants. < 130/8069mG in hypertensive participants with diabetes, heart failure or chronic kidney disease.;Long Term: Maintenance of blood pressure at goal levels.    Lipids Yes     Intervention Provide education and support for participant on nutrition & aerobic/resistive exercise along with prescribed medications to achieve LDL <63m12mDL >40mg20m Expected Outcomes Short Term: Participant states understanding of desired cholesterol values and is compliant with medications prescribed. Participant is following exercise prescription and nutrition guidelines.;Long Term: Cholesterol controlled with medications as prescribed, with individualized exercise RX and with personalized nutrition plan. Value goals: LDL < 63mg,18m > 40 mg.             Education:Diabetes - Individual verbal and written instruction to review signs/symptoms of diabetes, desired ranges of glucose level fasting, after meals and with exercise. Acknowledge that pre and post exercise glucose checks will be done for 3 sessions at entry of program.   Core Components/Risk Factors/Patient Goals Review:   Goals and Risk Factor Review     Row Name 08/05/21 1126             Core Components/Risk Factors/Patient Goals Review   Personal Goals  Review Weight Management/Obesity;Hypertension       Review Floye is doing well in rehab. She is working on weight loss and trending down for most part.  Her pressures are the best they have ever been.       Expected Outcomes Short: Continue to work on weight loss Long: Continue to monitor risk factors                Core Components/Risk Factors/Patient Goals at Discharge (Final Review):   Goals and Risk Factor Review - 08/05/21 1126       Core Components/Risk Factors/Patient Goals Review   Personal Goals Review Weight Management/Obesity;Hypertension    Review Dannae is doing well in rehab. She is working on weight loss and trending down for most part.  Her pressures are the best they have ever been.    Expected Outcomes Short: Continue to work on weight loss Long: Continue to monitor risk factors             ITP Comments:  ITP Comments     Row Name  06/02/21 1402 06/11/21 1213 06/17/21 1101 06/18/21 0654 07/16/21 0602   ITP Comments Initial telephone orientation completed. Diagnosis can be found in [facility] 6/29. EP orientation scheduled for Wednesday 8/3 at [place] presented to rehab for gym orientation- 6MWT was not completed as patient had open-toes shoes. Will complete 6MWT next week on 8/9. First full day of exercise!  Patient was oriented to gym and equipment including functions, settings, policies, and procedures.  Patient's individual exercise prescription and treatment plan were reviewed.  All starting workloads were established based on the results of the 6 minute walk test done at initial orientation visit.  The plan for exercise progression was also introduced and progression will be customized based on patient's performance and goals. 30 Day review completed. Medical Director ITP review done, changes made as directed, and signed approval by Medical Director.    New to program 30 Day review completed. Medical Director ITP review done, changes made as directed, and signed approval by Medical Director.    [place] Name 07/16/21 1519 08/13/21 0804         ITP Comments Out since 8/16 with COVID and has yet to return to rehab. 30 day review completed. ITP sent to Dr. Emily Filbert, Medical Director of Cardiac Rehab. Continue with ITP unless changes are made by physician.               Comments:30 day review

## 2021-08-14 ENCOUNTER — Other Ambulatory Visit: Payer: Self-pay

## 2021-08-14 DIAGNOSIS — Z955 Presence of coronary angioplasty implant and graft: Secondary | ICD-10-CM | POA: Diagnosis not present

## 2021-08-14 NOTE — Progress Notes (Signed)
Daily Session Note  Patient Details  Name: Morgan Moore MRN: [id number] Date of Birth: [birth year] Referring Provider:   Flowsheet Row Cardiac Rehab from 06/17/2021 in [facility] Cardiac and Pulmonary Rehab  Referring Provider Lujean Amel MD       Encounter Date: 08/14/2021  Check In:  Session Check In - 08/14/21 1059       Check-In   Supervising physician immediately available to respond to emergencies See telemetry face sheet for immediately available ER MD    Location [facility]-Cardiac & Pulmonary Rehab    Staff Present Birdie Sons, MPA, RN;Jessica Luan [place]ing, MA, RCEP, CCRP, CCET;Meredith Sherryll Burger, RN BSN    Virtual Visit No    Medication changes reported     No    Fall or balance concerns reported    No    Tobacco Cessation No Change    Warm-up and Cool-down Performed on first and last piece of equipment    Resistance Training Performed Yes    VAD Patient? No    PAD/SET Patient? No      Pain Assessment   Currently in Pain? No/denies                Social History   Tobacco Use  Smoking Status Never  Smokeless Tobacco Never    Goals Met:  Independence with exercise equipment Exercise tolerated well No report of concerns or symptoms today Strength training completed today  Goals Unmet:  Not Applicable  Comments: Pt able to follow exercise prescription today without complaint.  Will continue to monitor for progression.    Dr. Emily Filbert is Medical Director for [place].  Dr. Ottie Glazier is Medical Director for [facility] Pulmonary Rehabilitation.

## 2021-08-19 ENCOUNTER — Other Ambulatory Visit: Payer: Self-pay

## 2021-08-19 DIAGNOSIS — Z955 Presence of coronary angioplasty implant and graft: Secondary | ICD-10-CM | POA: Diagnosis not present

## 2021-08-19 NOTE — Progress Notes (Signed)
Daily Session Note  Patient Details  Name: Morgan Moore MRN: [id number] Date of Birth: Jan 09, 1956 Referring Provider:   Flowsheet Row Cardiac Rehab from 06/17/2021 in [facility] Cardiac and Pulmonary Rehab  Referring Provider Lujean Amel MD       Encounter Date: 08/19/2021  Check In:  Session Check In - 08/19/21 1110       Check-In   Supervising physician immediately available to respond to emergencies See telemetry face sheet for immediately available ER MD    Location [facility]-Cardiac & Pulmonary Rehab    Staff Present Birdie Sons, MPA, RN;Melissa [place], RDN, Rowe Pavy, BA, ACSM CEP, Exercise Physiologist    Virtual Visit No    Medication changes reported     Yes    Comments repatha injection    Fall or balance concerns reported    No    Tobacco Cessation No Change    Warm-up and Cool-down Performed on first and last piece of equipment    Resistance Training Performed Yes    VAD Patient? No    PAD/SET Patient? No      Pain Assessment   Currently in Pain? No/denies                Social History   Tobacco Use  Smoking Status Never  Smokeless Tobacco Never    Goals Met:  Independence with exercise equipment Exercise tolerated well No report of concerns or symptoms today Strength training completed today  Goals Unmet:  Not Applicable  Comments: Pt able to follow exercise prescription today without complaint.  Will continue to monitor for progression.    Dr. Emily Filbert is Medical Director for [place].  Dr. Ottie Glazier is Medical Director for [facility] Pulmonary Rehabilitation.

## 2021-08-20 DIAGNOSIS — L821 Other seborrheic keratosis: Secondary | ICD-10-CM | POA: Diagnosis not present

## 2021-08-20 DIAGNOSIS — L814 Other melanin hyperpigmentation: Secondary | ICD-10-CM | POA: Diagnosis not present

## 2021-08-20 DIAGNOSIS — L570 Actinic keratosis: Secondary | ICD-10-CM | POA: Diagnosis not present

## 2021-08-20 DIAGNOSIS — D225 Melanocytic nevi of trunk: Secondary | ICD-10-CM | POA: Diagnosis not present

## 2021-08-20 DIAGNOSIS — L739 Follicular disorder, unspecified: Secondary | ICD-10-CM | POA: Diagnosis not present

## 2021-08-20 DIAGNOSIS — D1801 Hemangioma of skin and subcutaneous tissue: Secondary | ICD-10-CM | POA: Diagnosis not present

## 2021-08-20 DIAGNOSIS — D2260 Melanocytic nevi of unspecified upper limb, including shoulder: Secondary | ICD-10-CM | POA: Diagnosis not present

## 2021-08-20 DIAGNOSIS — D2270 Melanocytic nevi of unspecified lower limb, including hip: Secondary | ICD-10-CM | POA: Diagnosis not present

## 2021-08-21 ENCOUNTER — Other Ambulatory Visit: Payer: Self-pay

## 2021-08-21 DIAGNOSIS — Z955 Presence of coronary angioplasty implant and graft: Secondary | ICD-10-CM

## 2021-08-21 NOTE — Progress Notes (Signed)
Daily Session Note  Patient Details  Name: Morgan Moore MRN: [id number] Date of Birth: 1956/07/27 Referring Provider:   Flowsheet Row Cardiac Rehab from 06/17/2021 in [facility] Cardiac and Pulmonary Rehab  Referring Provider Lujean Amel MD       Encounter Date: 08/21/2021  Check In:  Session Check In - 08/21/21 1110       Check-In   Supervising physician immediately available to respond to emergencies See telemetry face sheet for immediately available ER MD    Location [facility]-Cardiac & Pulmonary Rehab    Staff Present Birdie Sons, MPA, Elveria Rising, BA, ACSM CEP, Exercise Physiologist;Melissa [place], RDN, LDN;Joseph [place], RCP,RRT,BSRT;Meredith [place], RN BSN;Jessica [place], MA, RCEP, CCRP, CCET    Virtual Visit No    Medication changes reported     No    Fall or balance concerns reported    No    Tobacco Cessation No Change    Warm-up and Cool-down Performed on first and last piece of equipment    Resistance Training Performed Yes    VAD Patient? No    PAD/SET Patient? No      Pain Assessment   Currently in Pain? No/denies                Social History   Tobacco Use  Smoking Status Never  Smokeless Tobacco Never    Goals Met:  Independence with exercise equipment Exercise tolerated well No report of concerns or symptoms today Strength training completed today  Goals Unmet:  Not Applicable  Comments: Pt able to follow exercise prescription today without complaint.  Will continue to monitor for progression.    Dr. Emily Filbert is Medical Director for [place].  Dr. Ottie Glazier is Medical Director for [facility] Pulmonary Rehabilitation.

## 2021-08-26 ENCOUNTER — Other Ambulatory Visit: Payer: Self-pay

## 2021-08-26 VITALS — Ht 62.0 in | Wt 167.7 lb

## 2021-08-26 DIAGNOSIS — Z955 Presence of coronary angioplasty implant and graft: Secondary | ICD-10-CM | POA: Diagnosis not present

## 2021-08-26 NOTE — Progress Notes (Signed)
Discharge Progress Report  Patient Details  Name: Morgan Moore MRN: [id number] Date of Birth: Apr 10, 1956 Referring Provider:   Flowsheet Row Cardiac Rehab from 06/17/2021 in [facility]  Referring Provider Lujean Amel MD        Number of Visits: 14  Reason for Discharge:  Early Exit:  Patient felt like she had gotten what she needed from the program and decided to graduate. She states that she plans to continue exercising.   Smoking History:  Social History   Tobacco Use  Smoking Status Never  Smokeless Tobacco Never    Diagnosis:  Status post coronary artery stent placement  ADL UCSD:   Initial Exercise Prescription:  Initial Exercise Prescription - 06/17/21 1100       Date of Initial Exercise RX and Referring Provider   Date 06/17/21    Referring Provider Lujean Amel MD      Treadmill   MPH 2.3    Grade 0.5    Minutes 15    METs 2.92      NuStep   Level 2    SPM 80    Minutes 15    METs 2.7      Biostep-RELP   Level 2    SPM 50    Minutes 15    METs 2      Prescription Details   Frequency (times per week) 2    Duration Progress to 30 minutes of continuous aerobic without signs/symptoms of physical distress      Intensity   THRR 40-80% of Max Heartrate 100-136    Ratings of Perceived Exertion 11-13    Perceived Dyspnea 0-4      Progression   Progression Continue to progress workloads to maintain intensity without signs/symptoms of physical distress.      Resistance Training   Training Prescription Yes    Weight 3 lb    Reps 10-15             Discharge Exercise Prescription (Final Exercise Prescription Changes):  Exercise Prescription Changes - 08/25/21 1500       Response to Exercise   Blood Pressure (Admit) 120/64    Blood Pressure (Exit) 118/62    Heart Rate (Admit) 68 bpm    Heart Rate (Exercise) 150 bpm    Heart Rate (Exit) 88 bpm    Rating of Perceived Exertion (Exercise) 13     Symptoms none    Duration Continue with 30 min of aerobic exercise without signs/symptoms of physical distress.    Intensity THRR unchanged      Progression   Progression Continue to progress workloads to maintain intensity without signs/symptoms of physical distress.    Average METs 3.25      Resistance Training   Training Prescription Yes    Weight 2 lb    Reps 10-15      Interval Training   Interval Training No      Treadmill   MPH 2.8    Grade 0.5    Minutes 15    METs 3.34      NuStep   Level 4    Minutes 15    METs 3      Home Exercise Plan   Plans to continue exercise at Home (comment)   walking, staff videos   Frequency Add 2 additional days to program exercise sessions.    Initial Home Exercises Provided 08/05/21      Oxygen   Maintain Oxygen Saturation 88% or  higher             Functional Capacity:  6 Minute Walk     Row Name 06/17/21 1113 08/26/21 1135       6 Minute Walk   Phase Initial Discharge    Distance 1275 feet 1730 feet    Distance % Change -- 35.6 %    Distance Feet Change -- 455 ft    Walk Time 6 minutes 6 minutes    # of Rest Breaks 0 0    MPH 2.41 3.27    METS 2.79 4.1    RPE 11 15    Perceived Dyspnea  1 1    VO2 Peak 9.76 14.5    Symptoms Yes (comment) No    Comments SOB --    Resting HR 64 bpm 79 bpm    Resting BP 134/66 118/64    Resting Oxygen Saturation  96 % --    Exercise Oxygen Saturation  during 6 min walk 96 % --    Max Ex. HR 94 bpm 114 bpm    Max Ex. BP 138/74 178/60    2 Minute Post BP 118/60 --             Psychological, QOL, Others - Outcomes: PHQ 2/9: Depression screen [facility] 2/9 08/26/2021 06/11/2021 04/08/2021 03/19/2021 11/29/2020  Decreased Interest 0 0 0 0 0  Down, Depressed, Hopeless 0 0 0 0 0  PHQ - 2 Score 0 0 0 0 0  Altered sleeping 0 0 0 0 0  Tired, decreased energy 1 0 0 1 0  Change in appetite 0 0 0 0 0  Feeling bad or failure about yourself  0 0 0 0 0  Trouble concentrating 0 0 0 0 0   Moving slowly or fidgety/restless 0 0 0 0 0  Suicidal thoughts 0 0 - - 0  PHQ-9 Score 1 0 0 1 0  Difficult doing work/chores Not difficult at all Not difficult at all Not difficult at all Not difficult at all Not difficult at all  Some recent data might be hidden    Quality of Life:  Quality of Life - 08/26/21 1230       Quality of Life   Select Quality of Life      Quality of Life Scores   Health/Function Pre 25.14 %    Health/Function Post 24.67 %    Health/Function % Change -1.87 %    Socioeconomic Pre 28.57 %    Socioeconomic Post 29.38 %    Socioeconomic % Change  2.84 %    Psych/Spiritual Pre 25.64 %    Psych/Spiritual Post 26.86 %    Psych/Spiritual % Change 4.76 %    Family Pre 26.88 %    Family Post 30 %    Family % Change 11.61 %    GLOBAL Pre 26.22 %    GLOBAL Post 26.94 %    GLOBAL % Change 2.75 %             P Nutrition & Weight - Outcomes:  Pre Biometrics - 06/17/21 1117       Pre Biometrics   Height 5' 2"  (1.575 m)    Weight 169 lb 9.6 oz (76.9 kg)    BMI (Calculated) 31.01    Single Leg Stand 9.7 seconds             Post Biometrics - 08/26/21 1139        Post  Biometrics   Height  5' 2"  (1.575 m)    Weight 167 lb 11.2 oz (76.1 kg)    BMI (Calculated) 30.66    Single Leg Stand 18.35 seconds             Nutrition:  Nutrition Therapy & Goals - 06/19/21 1150       Nutrition Therapy   RD appointment deferred Yes   She would like to defer for a couple of weeks and then decide as she has a lot going on right now. She would also like to be re-evalutated by the GI MD - she has Crohns and has recently been diagnosed with diverticulosis            Nutrition Discharge:   Education Questionnaire Score:  Knowledge Questionnaire Score - 08/26/21 1230       Knowledge Questionnaire Score   Pre Score 25/26: Exercise    Post Score 17/26 didnt answer all questions             Goals reviewed with patient; copy given to  patient.

## 2021-08-26 NOTE — Patient Instructions (Signed)
Discharge Patient Instructions  Patient Details  Name: Morgan Moore MRN: [id number] Date of Birth: Mar 28, 1956 Referring Provider:  Yolonda Kida, MD   Number of Visits: 13  Reason for Discharge:  Early Exit:  Personal  Smoking History:  Social History   Tobacco Use  Smoking Status Never  Smokeless Tobacco Never    Diagnosis:  Status post coronary artery stent placement  Initial Exercise Prescription:  Initial Exercise Prescription - 06/17/21 1100       Date of Initial Exercise RX and Referring Provider   Date 06/17/21    Referring Provider Lujean Amel MD      Treadmill   MPH 2.3    Grade 0.5    Minutes 15    METs 2.92      NuStep   Level 2    SPM 80    Minutes 15    METs 2.7      Biostep-RELP   Level 2    SPM 50    Minutes 15    METs 2      Prescription Details   Frequency (times per week) 2    Duration Progress to 30 minutes of continuous aerobic without signs/symptoms of physical distress      Intensity   THRR 40-80% of Max Heartrate 100-136    Ratings of Perceived Exertion 11-13    Perceived Dyspnea 0-4      Progression   Progression Continue to progress workloads to maintain intensity without signs/symptoms of physical distress.      Resistance Training   Training Prescription Yes    Weight 3 lb    Reps 10-15             Discharge Exercise Prescription (Final Exercise Prescription Changes):  Exercise Prescription Changes - 08/25/21 1500       Response to Exercise   Blood Pressure (Admit) 120/64    Blood Pressure (Exit) 118/62    Heart Rate (Admit) 68 bpm    Heart Rate (Exercise) 150 bpm    Heart Rate (Exit) 88 bpm    Rating of Perceived Exertion (Exercise) 13    Symptoms none    Duration Continue with 30 min of aerobic exercise without signs/symptoms of physical distress.    Intensity THRR unchanged      Progression   Progression Continue to progress workloads to maintain intensity without signs/symptoms of  physical distress.    Average METs 3.25      Resistance Training   Training Prescription Yes    Weight 2 lb    Reps 10-15      Interval Training   Interval Training No      Treadmill   MPH 2.8    Grade 0.5    Minutes 15    METs 3.34      NuStep   Level 4    Minutes 15    METs 3      Home Exercise Plan   Plans to continue exercise at Home (comment)   walking, staff videos   Frequency Add 2 additional days to program exercise sessions.    Initial Home Exercises Provided 08/05/21      Oxygen   Maintain Oxygen Saturation 88% or higher             Functional Capacity:  6 Minute Walk     Row Name 06/17/21 1113 08/26/21 1135       6 Minute Walk   Phase Initial Discharge    Distance 1275  feet 1730 feet    Distance % Change -- 35.6 %    Distance Feet Change -- 455 ft    Walk Time 6 minutes 6 minutes    # of Rest Breaks 0 0    MPH 2.41 3.27    METS 2.79 4.1    RPE 11 15    Perceived Dyspnea  1 1    VO2 Peak 9.76 14.5    Symptoms Yes (comment) No    Comments SOB --    Resting HR 64 bpm 79 bpm    Resting BP 134/66 118/64    Resting Oxygen Saturation  96 % --    Exercise Oxygen Saturation  during 6 min walk 96 % --    Max Ex. HR 94 bpm 114 bpm    Max Ex. BP 138/74 178/60    2 Minute Post BP 118/60 --             Quality of Life:  Quality of Life - 06/11/21 1233       Quality of Life Scores   Health/Function Pre 25.14 %    Socioeconomic Pre 28.57 %    Psych/Spiritual Pre 25.64 %    Family Pre 26.88 %    GLOBAL Pre 26.22 %             Personal Goals: Goals established at orientation with interventions provided to work toward goal.  Personal Goals and Risk Factors at Admission - 06/17/21 1119       Core Components/Risk Factors/Patient Goals on Admission    Weight Management Yes;Obesity;Weight Loss    Intervention Weight Management: Develop a combined nutrition and exercise program designed to reach desired caloric intake, while maintaining  appropriate intake of nutrient and fiber, sodium and fats, and appropriate energy expenditure required for the weight goal.;Weight Management: Provide education and appropriate resources to help participant work on and attain dietary goals.;Weight Management/Obesity: Establish reasonable short term and long term weight goals.;Obesity: Provide education and appropriate resources to help participant work on and attain dietary goals.    Admit Weight 169 lb 9.6 oz (76.9 kg)    Goal Weight: Short Term 164 lb (74.4 kg)    Goal Weight: Long Term 160 lb (72.6 kg)    Expected Outcomes Short Term: Continue to assess and modify interventions until short term weight is achieved;Long Term: Adherence to nutrition and physical activity/exercise program aimed toward attainment of established weight goal;Weight Loss: Understanding of general recommendations for a balanced deficit meal plan, which promotes 1-2 lb weight loss per week and includes a negative energy balance of [phone] kcal/d;Understanding recommendations for meals to include 15-35% energy as protein, 25-35% energy from fat, 35-60% energy from carbohydrates, less than 253m of dietary cholesterol, 20-35 gm of total fiber daily;Understanding of distribution of calorie intake throughout the day with the consumption of 4-5 meals/snacks    Hypertension Yes    Intervention Provide education on lifestyle modifcations including regular physical activity/exercise, weight management, moderate sodium restriction and increased consumption of fresh fruit, vegetables, and low fat dairy, alcohol moderation, and smoking cessation.;Monitor prescription use compliance.    Expected Outcomes Short Term: Continued assessment and intervention until BP is < 140/989mHG in hypertensive participants. < 130/8052mG in hypertensive participants with diabetes, heart failure or chronic kidney disease.;Long Term: Maintenance of blood pressure at goal levels.    Lipids Yes    Intervention  Provide education and support for participant on nutrition & aerobic/resistive exercise along with prescribed medications to  achieve LDL <4m, HDL >475m    Expected Outcomes Short Term: Participant states understanding of desired cholesterol values and is compliant with medications prescribed. Participant is following exercise prescription and nutrition guidelines.;Long Term: Cholesterol controlled with medications as prescribed, with individualized exercise RX and with personalized nutrition plan. Value goals: LDL < 7046mHDL > 40 mg.              Personal Goals Discharge:  Goals and Risk Factor Review - 08/05/21 1126       Core Components/Risk Factors/Patient Goals Review   Personal Goals Review Weight Management/Obesity;Hypertension    Review KarZali doing well in rehab. She is working on weight loss and trending down for most part.  Her pressures are the best they have ever been.    Expected Outcomes Short: Continue to work on weight loss Long: Continue to monitor risk factors             Exercise Goals and Review:  Exercise Goals     Row Name 06/17/21 1118             Exercise Goals   Increase Physical Activity Yes       Intervention Provide advice, education, support and counseling about physical activity/exercise needs.;Develop an individualized exercise prescription for aerobic and resistive training based on initial evaluation findings, risk stratification, comorbidities and participant's personal goals.       Expected Outcomes Short Term: Attend rehab on a regular basis to increase amount of physical activity.;Long Term: Add in home exercise to make exercise part of routine and to increase amount of physical activity.;Long Term: Exercising regularly at least 3-5 days a week.       Increase Strength and Stamina Yes       Intervention Provide advice, education, support and counseling about physical activity/exercise needs.;Develop an individualized exercise  prescription for aerobic and resistive training based on initial evaluation findings, risk stratification, comorbidities and participant's personal goals.       Expected Outcomes Short Term: Increase workloads from initial exercise prescription for resistance, speed, and METs.;Short Term: Perform resistance training exercises routinely during rehab and add in resistance training at home;Long Term: Improve cardiorespiratory fitness, muscular endurance and strength as measured by increased METs and functional capacity (6MWT)       Able to understand and use rate of perceived exertion (RPE) scale Yes       Intervention Provide education and explanation on how to use RPE scale       Expected Outcomes Short Term: Able to use RPE daily in rehab to express subjective intensity level;Long Term:  Able to use RPE to guide intensity level when exercising independently       Able to understand and use Dyspnea scale Yes       Intervention Provide education and explanation on how to use Dyspnea scale       Expected Outcomes Short Term: Able to use Dyspnea scale daily in rehab to express subjective sense of shortness of breath during exertion;Long Term: Able to use Dyspnea scale to guide intensity level when exercising independently       Knowledge and understanding of Target Heart Rate Range (THRR) Yes       Intervention Provide education and explanation of THRR including how the numbers were predicted and where they are located for reference       Expected Outcomes Short Term: Able to state/look up THRR;Short Term: Able to use daily as guideline for intensity in rehab;Long Term: Able  to use THRR to govern intensity when exercising independently       Able to check pulse independently Yes       Intervention Provide education and demonstration on how to check pulse in carotid and radial arteries.;Review the importance of being able to check your own pulse for safety during independent exercise       Expected Outcomes  Short Term: Able to explain why pulse checking is important during independent exercise;Long Term: Able to check pulse independently and accurately       Understanding of Exercise Prescription Yes       Intervention Provide education, explanation, and written materials on patient's individual exercise prescription       Expected Outcomes Long Term: Able to explain home exercise prescription to exercise independently;Short Term: Able to explain program exercise prescription                Exercise Goals Re-Evaluation:  Exercise Goals Re-Evaluation     [place] Name 06/17/21 1141 06/30/21 1403 07/16/21 1520 07/28/21 1556 08/05/21 1136     Exercise Goal Re-Evaluation   Exercise Goals Review Able to understand and use rate of perceived exertion (RPE) scale;Increase Physical Activity;Knowledge and understanding of Target Heart Rate Range (THRR);Understanding of Exercise Prescription;Increase Strength and Stamina;Able to understand and use Dyspnea scale;Able to check pulse independently Increase Physical Activity;Increase Strength and Stamina -- Increase Physical Activity;Increase Strength and Stamina Increase Physical Activity;Increase Strength and Stamina;Understanding of Exercise Prescription   Comments Reviewed RPE and dyspnea scales, THR and program prescription with pt today.  Pt voiced understanding and was given a copy of goals to take home. Halen has done well in her first couple sessions.  We will monitor progress when she returns to class. (out until 8/29/) Out since last review with COVID [place] is just returning after being out with Covid.  She is working to build back stamina after being out for 3 weeks. Tyne is doing well in rehab. She is staying active at home with yard work and working around [employer] but she is not doing any formal exercise. We talked about trying to work it into her schedule for mental boost.  Reviewed home exercise with pt today.  Pt plans to walk and use staff videos at  home for exercise.  Reviewed THR, pulse, RPE, sign and symptoms, pulse oximetery and when to call 911 or MD.  Also discussed weather considerations and indoor options.  Pt voiced understanding.   Expected Outcomes Short: Use RPE daily to regulate intensity. Long: Follow program prescription in THR. Short:  attend consistently Long: improve overall stamina -- Short: get back to consistent attendance Long:  improve stamina Short: Try to add in more exercise at home Long: Make time to exercise    Row Name 08/11/21 1156 08/25/21 1558           Exercise Goal Re-Evaluation   Exercise Goals Review Increase Physical Activity;Increase Strength and Stamina;Understanding of Exercise Prescription Increase Physical Activity;Increase Strength and Stamina      Comments Carmyn is getting back into the swing of things in rehab since recently being out from sickness. She has increased her speed up to 2.5 on the treadmill and RPEs are staying in appropriate range. Will continue to monitor. Hydie wants to graduate early and will do her post walk next session.  We expect to see improvement on post walk.      Expected Outcomes Short: Continue to build up loads on treadmill Long: Increase overall MET level  Short: improve post walk Long:maintain exercise on her own               Nutrition & Weight - Outcomes:  Pre Biometrics - 06/17/21 1117       Pre Biometrics   Height 5' 2" (1.575 m)    Weight 169 lb 9.6 oz (76.9 kg)    BMI (Calculated) 31.01    Single Leg Stand 9.7 seconds              Nutrition:  Nutrition Therapy & Goals - 06/19/21 1150       Nutrition Therapy   RD appointment deferred Yes   She would like to defer for a couple of weeks and then decide as she has a lot going on right now. She would also like to be re-evalutated by the GI MD - she has Crohns and has recently been diagnosed with diverticulosis            Nutrition Discharge:   Education Questionnaire Score:  Knowledge  Questionnaire Score - 06/11/21 1232       Knowledge Questionnaire Score   Pre Score 25/26: Exercise             Goals reviewed with patient; copy given to patient.

## 2021-08-26 NOTE — Progress Notes (Signed)
Cardiac Individual Treatment Plan  Patient Details  Name: Morgan Moore MRN: [id number] Date of Birth: [birth year] Referring Provider:   Flowsheet Row Cardiac Rehab from 06/17/2021 in [facility] Cardiac and Pulmonary Rehab  Referring Provider Lujean Amel MD       Initial Encounter Date:  Flowsheet Row Cardiac Rehab from 06/17/2021 in [facility] Cardiac and Pulmonary Rehab  Date 06/17/21       Visit Diagnosis: Status post coronary artery stent placement  Patient's Home Medications on Admission:  Current Outpatient Medications:    aspirin EC 81 MG tablet, Take 81 mg by mouth daily., Disp: , Rfl:    Cholecalciferol 25 MCG (1000 UT) tablet, Take 1,000 Units by mouth 2 (two) times daily., Disp: , Rfl:    ciclopirox (PENLAC) 8 % solution, Apply topically at bedtime. Apply over nail and surrounding skin. Apply daily over previous coat. After seven (7) days, may remove with alcohol and continue cycle., Disp: 6.6 mL, Rfl: 1   clopidogrel (PLAVIX) 75 MG tablet, Take 75 mg by mouth daily., Disp: , Rfl:    cyanocobalamin 1000 MCG tablet, Take 1,000 mcg by mouth daily at 12 noon. , Disp: , Rfl:    diphenhydrAMINE (BENADRYL) 12.5 MG/5ML elixir, Take 12.5 mg by mouth every 6 (six) hours as needed (allergic reaction)., Disp: , Rfl:    EPINEPHrine 0.3 mg/0.3 mL IJ SOAJ injection, Inject 0.3 mg into the muscle once. , Disp: , Rfl:    escitalopram (LEXAPRO) 20 MG tablet, TAKE 1 TABLET BY MOUTH ONCE DAILY. (Patient taking differently: Take 20 mg by mouth at bedtime.), Disp: 90 tablet, Rfl: 0   Fe Fum-FePoly-Vit C-Vit B3 (INTEGRA) 62.5-62.5-40-3 MG CAPS, TAKE (1) CAPSULE BY MOUTH ONCE DAILY., Disp: 90 capsule, Rfl: 0   Melatonin-Pyridoxine 3-10 MG TABS, Take 1 tablet by mouth at bedtime., Disp: , Rfl:    mesalamine (LIALDA) 1.2 g EC tablet, Take 1.2 g by mouth 3 (three) times daily with meals. , Disp: , Rfl:    mupirocin ointment (BACTROBAN) 2 %, Apply to affected area bid (Patient taking differently: Apply  1 application topically 2 (two) times daily as needed (rash in head).), Disp: 22 g, Rfl: 0   nitroGLYCERIN (NITROSTAT) 0.4 MG SL tablet, DISSOLVE (1) TABLET UNDER TONGUE AS NEEDED TO RELIEVE CHEST PAIN. MAYREPEAT EVERY 5 MINUTES., Disp: 25 tablet, Rfl: 0   ondansetron (ZOFRAN-ODT) 4 MG disintegrating tablet, Take 4 mg by mouth daily as needed for nausea. , Disp: , Rfl:    pantoprazole (PROTONIX) 20 MG tablet, Take 20 mg by mouth daily. , Disp: , Rfl:    prednisoLONE acetate (PRED FORTE) 1 % ophthalmic suspension, Place 1 drop into both eyes 2 (two) times daily as needed (for inflammation)., Disp: , Rfl:    rizatriptan (MAXALT) 5 MG tablet, USE AS DIRECTED, Disp: 10 tablet, Rfl: 0   rosuvastatin (CRESTOR) 5 MG tablet, Take 1-2 tablets q day., Disp: 60 tablet, Rfl: 4   verapamil (CALAN-SR) 120 MG CR tablet, Take 120 mg by mouth every evening. , Disp: , Rfl:   Past Medical History: Past Medical History:  Diagnosis Date   Allergy    Anemia    Anxiety    Chicken pox    Complication of anesthesia    vomitting   Coronary artery disease    Crohn disease ([place])    DVT (deep venous thrombosis) ([place]) 2017   Dyspnea    on excertion   Dysrhythmia 2019   palpitations   GERD (gastroesophageal reflux disease)  Heart disease    Heart murmur    Heart murmur    History of kidney stones    Hyperlipidemia    Hypertension    Inflammatory bowel disease    Migraines    hormonal, puberty   Myocardial infarction ([place])    Phlebitis    PONV (postoperative nausea and vomiting)     Tobacco Use: Social History   Tobacco Use  Smoking Status Never  Smokeless Tobacco Never    Labs: Recent Review Flowsheet Data     Labs for ITP Cardiac and Pulmonary Rehab Latest Ref Rng & Units 10/23/2019 03/11/2020 07/16/2020 11/14/2020 03/17/2021   Cholestrol 0 - 200 mg/dL 167 193 180 172 174   LDLCALC 0 - 99 mg/dL 97 114(H) 104(H) 102(H) 101(H)   HDL >39.00 mg/dL 48.00 48.70 45.10 40.80 50.30   Trlycerides 0.0 -  149.0 mg/dL 111.0 153.0(H) 156.0(H) 146.0 117.0        Exercise Target Goals: Exercise Program Goal: Individual exercise prescription set using results from initial 6 min walk test and THRR while considering  patient's activity barriers and safety.   Exercise Prescription Goal: Initial exercise prescription builds to 30-45 minutes a day of aerobic activity, 2-3 days per week.  Home exercise guidelines will be given to patient during program as part of exercise prescription that the participant will acknowledge.   Education: Aerobic Exercise: - Group verbal and visual presentation on the components of exercise prescription. Introduces F.I.T.T principle from ACSM for exercise prescriptions.  Reviews F.I.T.T. principles of aerobic exercise including progression. Written material given at graduation. Flowsheet Row Cardiac Rehab from 09/14/2016 in [facility] Cardiac and Pulmonary Rehab  Date 08/12/16  Educator [facility]  Instruction Review Code (retired) 2- meets goals/outcomes       Education: Resistance Exercise: - Group verbal and visual presentation on the components of exercise prescription. Introduces F.I.T.T principle from ACSM for exercise prescriptions  Reviews F.I.T.T. principles of resistance exercise including progression. Written material given at graduation.    Education: Exercise & Equipment Safety: - Individual verbal instruction and demonstration of equipment use and safety with use of the equipment. Flowsheet Row Cardiac Rehab from 06/11/2021 in [facility] Cardiac and Pulmonary Rehab  Education need identified 06/11/21  Date 06/11/21  Educator [place]  Instruction Review Code 1- Verbalizes Understanding       Education: Exercise Physiology & General Exercise Guidelines: - Group verbal and written instruction with models to review the exercise physiology of the cardiovascular system and associated critical values. Provides general exercise guidelines with specific guidelines to those with  heart or lung disease.  Flowsheet Row Cardiac Rehab from 09/14/2016 in [facility] Cardiac and Pulmonary Rehab  Date 08/10/16  Educator [facility]  Instruction Review Code (retired) 2- meets goals/outcomes       Education: Flexibility, Insurance underwriter, Mind/Body Relaxation: - Group verbal and visual presentation with interactive activity on the components of exercise prescription. Introduces F.I.T.T principle from ACSM for exercise prescriptions. Reviews F.I.T.T. principles of flexibility and balance exercise training including progression. Also discusses the mind body connection.  Reviews various relaxation techniques to help reduce and manage stress (i.e. Deep breathing, progressive muscle relaxation, and visualization). Balance handout provided to take home. Written material given at graduation. Flowsheet Row Cardiac Rehab from 09/14/2016 in [facility] Cardiac and Pulmonary Rehab  Date 08/17/16  Educator [facility]  Instruction Review Code (retired) 2- meets goals/outcomes       Activity Barriers & Risk Stratification:  Activity Barriers & Cardiac Risk Stratification - 06/11/21 1224  Activity Barriers & Cardiac Risk Stratification   Activity Barriers Muscular Weakness;Deconditioning;Other (comment)    Comments Limited ROM moving arms with flexion    Cardiac Risk Stratification High             6 Minute Walk:  6 Minute Walk     Row Name 06/17/21 1113 08/26/21 1135       6 Minute Walk   Phase Initial Discharge    Distance 1275 feet 1730 feet    Distance % Change -- 35.6 %    Distance Feet Change -- 455 ft    Walk Time 6 minutes 6 minutes    # of Rest Breaks 0 0    MPH 2.41 3.27    METS 2.79 4.1    RPE 11 15    Perceived Dyspnea  1 1    VO2 Peak 9.76 14.5    Symptoms Yes (comment) No    Comments SOB --    Resting HR 64 bpm 79 bpm    Resting BP 134/66 118/64    Resting Oxygen Saturation  96 % --    Exercise Oxygen Saturation  during 6 min walk 96 % --    Max Ex. HR 94 bpm 114 bpm    Max Ex.  BP 138/74 178/60    2 Minute Post BP 118/60 --             Oxygen Initial Assessment:   Oxygen Re-Evaluation:   Oxygen Discharge (Final Oxygen Re-Evaluation):   Initial Exercise Prescription:  Initial Exercise Prescription - 06/17/21 1100       Date of Initial Exercise RX and Referring Provider   Date 06/17/21    Referring Provider Lujean Amel MD      Treadmill   MPH 2.3    Grade 0.5    Minutes 15    METs 2.92      NuStep   Level 2    SPM 80    Minutes 15    METs 2.7      Biostep-RELP   Level 2    SPM 50    Minutes 15    METs 2      Prescription Details   Frequency (times per week) 2    Duration Progress to 30 minutes of continuous aerobic without signs/symptoms of physical distress      Intensity   THRR 40-80% of Max Heartrate 100-136    Ratings of Perceived Exertion 11-13    Perceived Dyspnea 0-4      Progression   Progression Continue to progress workloads to maintain intensity without signs/symptoms of physical distress.      Resistance Training   Training Prescription Yes    Weight 3 lb    Reps 10-15             Perform Capillary Blood Glucose checks as needed.  Exercise Prescription Changes:   Exercise Prescription Changes     Row Name 06/17/21 1100 06/30/21 1400 07/28/21 1500 08/05/21 1100 08/11/21 1100     Response to Exercise   Blood Pressure (Admit) 134/66 120/60 122/60 -- 128/66   Blood Pressure (Exercise) 138/74 122/64 128/64 -- --   Blood Pressure (Exit) 118/60 118/62 124/64 -- 120/62   Heart Rate (Admit) 64 bpm 72 bpm 70 bpm -- 89 bpm   Heart Rate (Exercise) 94 bpm 93 bpm 103 bpm -- 99 bpm   Heart Rate (Exit) 71 bpm 74 bpm 73 bpm -- 67 bpm   Oxygen Saturation (Admit)  96 % -- -- -- --   Oxygen Saturation (Exercise) 96 % -- -- -- --   Rating of Perceived Exertion (Exercise) _0 -- 13   Perceived Dyspnea (Exercise) 1 -- -- -- --   Symptoms SOB none none -- none   Comments walk test results second day -- -- --    Duration -- Progress to 30 minutes of  aerobic without signs/symptoms of physical distress Continue with 30 min of aerobic exercise without signs/symptoms of physical distress. -- Continue with 30 min of aerobic exercise without signs/symptoms of physical distress.   Intensity -- THRR unchanged THRR unchanged -- THRR unchanged     Progression   Progression -- Continue to progress workloads to maintain intensity without signs/symptoms of physical distress. Continue to progress workloads to maintain intensity without signs/symptoms of physical distress. -- Continue to progress workloads to maintain intensity without signs/symptoms of physical distress.   Average METs -- 2.7 2.5 -- 2.91     Resistance Training   Training Prescription -- Yes Yes -- Yes   Weight -- 2 lb 2 lb -- 2 lb   Reps -- 10-15 10-15 -- 10-15     Interval Training   Interval Training -- -- -- -- No     Treadmill   MPH -- 2.3 -- -- 2.5   Grade -- 0.5 -- -- 0.5   Minutes -- 15 -- -- 15   METs -- 2.92 -- -- 3.09     NuStep   Level -- 2 4 -- 4   Minutes -- 15 15 -- 15   METs -- 2.5 2 -- 2.9     Track   Laps -- -- 28 -- --   Minutes -- -- 15 -- --   METs -- -- 2.5 -- --     Home Exercise Plan   Plans to continue exercise at -- -- -- Home (comment)  walking, staff videos Home (comment)  walking, staff videos   Frequency -- -- -- Add 2 additional days to program exercise sessions. Add 2 additional days to program exercise sessions.   Initial Home Exercises Provided -- -- -- 08/05/21 08/05/21     Oxygen   Maintain Oxygen Saturation -- -- -- -- 88% or higher    Row Name 08/25/21 1500             Response to Exercise   Blood Pressure (Admit) 120/64       Blood Pressure (Exit) 118/62       Heart Rate (Admit) 68 bpm       Heart Rate (Exercise) 150 bpm       Heart Rate (Exit) 88 bpm       Rating of Perceived Exertion (Exercise) 13       Symptoms none       Duration Continue with 30 min of aerobic exercise  without signs/symptoms of physical distress.       Intensity THRR unchanged               Progression   Progression Continue to progress workloads to maintain intensity without signs/symptoms of physical distress.       Average METs 3.25               Resistance Training   Training Prescription Yes       Weight 2 lb       Reps 10-15  Interval Training   Interval Training No               Treadmill   MPH 2.8       Grade 0.5       Minutes 15       METs 3.34               NuStep   Level 4       Minutes 15       METs 3               Home Exercise Plan   Plans to continue exercise at Home (comment)  walking, staff videos       Frequency Add 2 additional days to program exercise sessions.       Initial Home Exercises Provided 08/05/21               Oxygen   Maintain Oxygen Saturation 88% or higher                Exercise Comments:   Exercise Comments     Row Name 06/17/21 1101 08/26/21 1116         Exercise Comments First full day of exercise!  Patient was oriented to gym and equipment including functions, settings, policies, and procedures.  Patient's individual exercise prescription and treatment plan were reviewed.  All starting workloads were established based on the results of the 6 minute walk test done at initial orientation visit.  The plan for exercise progression was also introduced and progression will be customized based on patient's performance and goals. Ahmya graduated today from  rehab with 14 sessions completed.  Details of the patient's exercise prescription and what She needs to do in order to continue the prescription and progress were discussed with patient.  Patient was given a copy of prescription and goals.  Patient verbalized understanding.  Brooklen plans to continue to exercise by walking and using staff videos.               Exercise Goals and Review:   Exercise Goals     Row Name 06/17/21 1118              Exercise Goals   Increase Physical Activity Yes       Intervention Provide advice, education, support and counseling about physical activity/exercise needs.;Develop an individualized exercise prescription for aerobic and resistive training based on initial evaluation findings, risk stratification, comorbidities and participant's personal goals.       Expected Outcomes Short Term: Attend rehab on a regular basis to increase amount of physical activity.;Long Term: Add in home exercise to make exercise part of routine and to increase amount of physical activity.;Long Term: Exercising regularly at least 3-5 days a week.       Increase Strength and Stamina Yes       Intervention Provide advice, education, support and counseling about physical activity/exercise needs.;Develop an individualized exercise prescription for aerobic and resistive training based on initial evaluation findings, risk stratification, comorbidities and participant's personal goals.       Expected Outcomes Short Term: Increase workloads from initial exercise prescription for resistance, speed, and METs.;Short Term: Perform resistance training exercises routinely during rehab and add in resistance training at home;Long Term: Improve cardiorespiratory fitness, muscular endurance and strength as measured by increased METs and functional capacity (6MWT)       Able to understand and use rate of perceived exertion (RPE) scale Yes  Intervention Provide education and explanation on how to use RPE scale       Expected Outcomes Short Term: Able to use RPE daily in rehab to express subjective intensity level;Long Term:  Able to use RPE to guide intensity level when exercising independently       Able to understand and use Dyspnea scale Yes       Intervention Provide education and explanation on how to use Dyspnea scale       Expected Outcomes Short Term: Able to use Dyspnea scale daily in rehab to express subjective sense of shortness of breath  during exertion;Long Term: Able to use Dyspnea scale to guide intensity level when exercising independently       Knowledge and understanding of Target Heart Rate Range (THRR) Yes       Intervention Provide education and explanation of THRR including how the numbers were predicted and where they are located for reference       Expected Outcomes Short Term: Able to state/look up THRR;Short Term: Able to use daily as guideline for intensity in rehab;Long Term: Able to use THRR to govern intensity when exercising independently       Able to check pulse independently Yes       Intervention Provide education and demonstration on how to check pulse in carotid and radial arteries.;Review the importance of being able to check your own pulse for safety during independent exercise       Expected Outcomes Short Term: Able to explain why pulse checking is important during independent exercise;Long Term: Able to check pulse independently and accurately       Understanding of Exercise Prescription Yes       Intervention Provide education, explanation, and written materials on patient's individual exercise prescription       Expected Outcomes Long Term: Able to explain home exercise prescription to exercise independently;Short Term: Able to explain program exercise prescription                Exercise Goals Re-Evaluation :  Exercise Goals Re-Evaluation     [place] Name 06/17/21 1141 06/30/21 1403 07/16/21 1520 07/28/21 1556 08/05/21 1136     Exercise Goal Re-Evaluation   Exercise Goals Review Able to understand and use rate of perceived exertion (RPE) scale;Increase Physical Activity;Knowledge and understanding of Target Heart Rate Range (THRR);Understanding of Exercise Prescription;Increase Strength and Stamina;Able to understand and use Dyspnea scale;Able to check pulse independently Increase Physical Activity;Increase Strength and Stamina -- Increase Physical Activity;Increase Strength and Stamina Increase  Physical Activity;Increase Strength and Stamina;Understanding of Exercise Prescription   Comments Reviewed RPE and dyspnea scales, THR and program prescription with pt today.  Pt voiced understanding and was given a copy of goals to take home. Arion has done well in her first couple sessions.  We will monitor progress when she returns to class. (out until 8/29/) Out since last review with COVID Tharon is just returning after being out with Covid.  She is working to build back stamina after being out for 3 weeks. Renley is doing well in rehab. She is staying active at home with yard work and working around [employer] but she is not doing any formal exercise. We talked about trying to work it into her schedule for mental boost.  Reviewed home exercise with pt today.  Pt plans to walk and use staff videos at home for exercise.  Reviewed THR, pulse, RPE, sign and symptoms, pulse oximetery and when to call 911 or MD.  Also  discussed weather considerations and indoor options.  Pt voiced understanding.   Expected Outcomes Short: Use RPE daily to regulate intensity. Long: Follow program prescription in THR. Short:  attend consistently Long: improve overall stamina -- Short: get back to consistent attendance Long:  improve stamina Short: Try to add in more exercise at home Long: Make time to exercise    Row Name 08/11/21 1156 08/25/21 1558           Exercise Goal Re-Evaluation   Exercise Goals Review Increase Physical Activity;Increase Strength and Stamina;Understanding of Exercise Prescription Increase Physical Activity;Increase Strength and Stamina      Comments Maren is getting back into the swing of things in rehab since recently being out from sickness. She has increased her speed up to 2.5 on the treadmill and RPEs are staying in appropriate range. Will continue to monitor. Marlise wants to graduate early and will do her post walk next session.  We expect to see improvement on post walk.      Expected Outcomes  Short: Continue to build up loads on treadmill Long: Increase overall MET level Short: improve post walk Long:maintain exercise on her own               Discharge Exercise Prescription (Final Exercise Prescription Changes):  Exercise Prescription Changes - 08/25/21 1500       Response to Exercise   Blood Pressure (Admit) 120/64    Blood Pressure (Exit) 118/62    Heart Rate (Admit) 68 bpm    Heart Rate (Exercise) 150 bpm    Heart Rate (Exit) 88 bpm    Rating of Perceived Exertion (Exercise) 13    Symptoms none    Duration Continue with 30 min of aerobic exercise without signs/symptoms of physical distress.    Intensity THRR unchanged      Progression   Progression Continue to progress workloads to maintain intensity without signs/symptoms of physical distress.    Average METs 3.25      Resistance Training   Training Prescription Yes    Weight 2 lb    Reps 10-15      Interval Training   Interval Training No      Treadmill   MPH 2.8    Grade 0.5    Minutes 15    METs 3.34      NuStep   Level 4    Minutes 15    METs 3      Home Exercise Plan   Plans to continue exercise at Home (comment)   walking, staff videos   Frequency Add 2 additional days to program exercise sessions.    Initial Home Exercises Provided 08/05/21      Oxygen   Maintain Oxygen Saturation 88% or higher             Nutrition:  Target Goals: Understanding of nutrition guidelines, daily intake of sodium <1537m, cholesterol <2075m calories 30% from fat and 7% or less from saturated fats, daily to have 5 or more servings of fruits and vegetables.  Education: All About Nutrition: -Group instruction provided by verbal, written material, interactive activities, discussions, models, and posters to present general guidelines for heart healthy nutrition including fat, fiber, MyPlate, the role of sodium in heart healthy nutrition, utilization of the nutrition label, and utilization of this  knowledge for meal planning. Follow up email sent as well. Written material given at graduation. [facility]  Date 07/27/16  Educator  Stephanie Coup, RD  Instruction Review Code (retired) 2- meets goals/outcomes       Biometrics:  Pre Biometrics - 06/17/21 1117       Pre Biometrics   Height 5' 2" (1.575 m)    Weight 169 lb 9.6 oz (76.9 kg)    BMI (Calculated) 31.01    Single Leg Stand 9.7 seconds             Post Biometrics - 08/26/21 1139        Post  Biometrics   Height 5' 2" (1.575 m)    Weight 167 lb 11.2 oz (76.1 kg)    BMI (Calculated) 30.66    Single Leg Stand 18.35 seconds             Nutrition Therapy Plan and Nutrition Goals:  Nutrition Therapy & Goals - 06/19/21 1150       Nutrition Therapy   RD appointment deferred Yes   She would like to defer for a couple of weeks and then decide as she has a lot going on right now. She would also like to be re-evalutated by the GI MD - she has Crohns and has recently been diagnosed with diverticulosis            Nutrition Assessments:  MEDIFICTS Score Key: ?70 Need to make dietary changes  40-70 Heart Healthy Diet ? 40 Therapeutic Level Cholesterol Diet  Flowsheet Row Cardiac Rehab from 08/26/2021 in [facility] Cardiac and Pulmonary Rehab  Picture Your Plate Total Score on Admission 57  Picture Your Plate Total Score on Discharge 66      Picture Your Plate Scores: <02 Unhealthy dietary pattern with much room for improvement. 41-50 Dietary pattern unlikely to meet recommendations for good health and room for improvement. 51-60 More healthful dietary pattern, with some room for improvement.  >60 Healthy dietary pattern, although there may be some specific behaviors that could be improved.    Nutrition Goals Re-Evaluation:  Nutrition Goals Re-Evaluation     [place] Name 08/05/21 1124             Goals   Nutrition Goal Eating heart  healthy       Comment Poet continues to defer meeting with dietitian.  She has been eating healthy and focus       Expected Outcome Continue to eat healthy                Nutrition Goals Discharge (Final Nutrition Goals Re-Evaluation):  Nutrition Goals Re-Evaluation - 08/05/21 1124       Goals   Nutrition Goal Eating heart healthy    Comment Cheyann continues to defer meeting with dietitian.  She has been eating healthy and focus    Expected Outcome Continue to eat healthy             Psychosocial: Target Goals: Acknowledge presence or absence of significant depression and/or stress, maximize coping skills, provide positive support system. Participant is able to verbalize types and ability to use techniques and skills needed for reducing stress and depression.   Education: Stress, Anxiety, and Depression - Group verbal and visual presentation to define topics covered.  Reviews how body is impacted by stress, anxiety, and depression.  Also discusses healthy ways to reduce stress and to treat/manage anxiety and depression.  Written material given at graduation. Flowsheet Row Cardiac Rehab from 09/14/2016 in [facility] Cardiac and Pulmonary Rehab  Date 07/29/16  Educator [facility]  Instruction Review Code (retired) 2- meets goals/outcomes  Education: Sleep Hygiene -Provides group verbal and written instruction about how sleep can affect your health.  Define sleep hygiene, discuss sleep cycles and impact of sleep habits. Review good sleep hygiene tips.    Initial Review & Psychosocial Screening:  Initial Psych Review & Screening - 06/02/21 1427       Initial Review   Current issues with Current Stress Concerns    Source of Stress Concerns Unable to perform yard/household activities      [place]? Yes   son (lives out of town), close by family, friends and church family     Barriers   Psychosocial barriers to participate in program There are no  identifiable barriers or psychosocial needs.;The patient should benefit from training in stress management and relaxation.      Screening Interventions   Interventions Encouraged to exercise;Provide feedback about the scores to participant;To provide support and resources with identified psychosocial needs    Expected Outcomes Short Term goal: Utilizing psychosocial counselor, staff and physician to assist with identification of specific Stressors or current issues interfering with healing process. Setting desired goal for each stressor or current issue identified.;Long Term Goal: Stressors or current issues are controlled or eliminated.;Short Term goal: Identification and review with participant of any Quality of Life or Depression concerns found by scoring the questionnaire.;Long Term goal: The participant improves quality of Life and PHQ9 Scores as seen by post scores and/or verbalization of changes             Quality of Life Scores:   Quality of Life - 08/26/21 1230       Quality of Life   Select Quality of Life      Quality of Life Scores   Health/Function Pre 25.14 %    Health/Function Post 24.67 %    Health/Function % Change -1.87 %    Socioeconomic Pre 28.57 %    Socioeconomic Post 29.38 %    Socioeconomic % Change  2.84 %    Psych/Spiritual Pre 25.64 %    Psych/Spiritual Post 26.86 %    Psych/Spiritual % Change 4.76 %    Family Pre 26.88 %    Family Post 30 %    Family % Change 11.61 %    GLOBAL Pre 26.22 %    GLOBAL Post 26.94 %    GLOBAL % Change 2.75 %            Scores of 19 and below usually indicate a poorer quality of life in these areas.  A difference of  2-3 points is a clinically meaningful difference.  A difference of 2-3 points in the total score of the Quality of Life Index has been associated with significant improvement in overall quality of life, self-image, physical symptoms, and general health in studies assessing change in quality of  life.  PHQ-9: Recent Review Flowsheet Data     Depression screen [facility] 2/9 08/26/2021 06/11/2021 04/08/2021 03/19/2021 11/29/2020   Decreased Interest 0 0 0 0 0   Down, Depressed, Hopeless 0 0 0 0 0   PHQ - 2 Score 0 0 0 0 0   Altered sleeping 0 0 0 0 0   Tired, decreased energy 1 0 0 1 0   Change in appetite 0 0 0 0 0   Feeling bad or failure about yourself  0 0 0 0 0   Trouble concentrating 0 0 0 0 0   Moving slowly or fidgety/restless 0 0 0  0 0   Suicidal thoughts 0 0 - - 0   PHQ-9 Score 1 0 0 1 0   Difficult doing work/chores Not difficult at all Not difficult at all Not difficult at all Not difficult at all Not difficult at all      Interpretation of Total Score  Total Score Depression Severity:  1-4 = Minimal depression, 5-9 = Mild depression, 10-14 = Moderate depression, 15-19 = Moderately severe depression, 20-27 = Severe depression   Psychosocial Evaluation and Intervention:  Psychosocial Evaluation - 06/02/21 1429       Psychosocial Evaluation & Interventions   Interventions Encouraged to exercise with the program and follow exercise prescription    Comments Ms. Greulich is returning to cardiac rehab (was here in 2017) after receiving two stents. She states her stamina needs improving and is hoping this program will help with that. She feels confident in her healthy lifestyle management skills, but needs some encouragement and guidance on increasing her stamina. She lives by herself and does all her own housework/yardwork. 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 She does have a son that lives out of town, is surrounded by family and friends and is very involved in her church family. She states she feels blessed to be surrounded by such an amazing support system, especially after dealing with the passing of her daughter in a car accident years ago and a lot of her family passing due to heart disease.    Expected Outcomes Short:  attend cardiac rehab for education and exercise. Long: develop and maintain positive self care habits    Continue Psychosocial Services  Follow up required by staff             Psychosocial Re-Evaluation:  Psychosocial Re-Evaluation     [place] Name 08/05/21 1132             Psychosocial Re-Evaluation   Current issues with Current Depression;Current Stress Concerns       Comments Fawne is doing well in rehab.  This week is the anniversary of her daughter's accident.  She is strugglingly emotionally but we talked about how much she is doing in her honor to make sure her name lives on. She is also working on helping to organize her church moving back into their [place].  She is also getting frustrated with not being able to bounce back and recover like she used to and gets upset that she cannot get every thing done.       Expected Outcomes Short: Make it through this week Long: continue to cope the best way she can.       Interventions Encouraged to attend Cardiac Rehabilitation for the exercise       Continue Psychosocial Services  Follow up required by staff                Psychosocial Discharge (Final Psychosocial Re-Evaluation):  Psychosocial Re-Evaluation - 08/05/21 1132       Psychosocial Re-Evaluation   Current issues with Current Depression;Current Stress Concerns    Comments Chyna is doing well in rehab.  This week is the anniversary of her daughter's accident.  She is strugglingly emotionally but we talked about how much she is doing in her honor to make sure her name lives on. She is also working on helping to organize her church moving back into their [place].  She is also getting frustrated with not being able to  bounce back and recover like she used to and gets upset that she cannot get every thing done.    Expected Outcomes Short: Make it through this week Long: continue to cope the best way she can.    Interventions Encouraged to attend Cardiac  Rehabilitation for the exercise    Continue Psychosocial Services  Follow up required by staff             Vocational Rehabilitation: Provide vocational rehab assistance to qualifying candidates.   Vocational Rehab Evaluation & Intervention:  Vocational Rehab - 06/02/21 1427       Initial Vocational Rehab Evaluation & Intervention   Assessment shows need for Vocational Rehabilitation No             Education: Education Goals: Education classes will be provided on a variety of topics geared toward better understanding of heart health and risk factor modification. Participant will state understanding/return demonstration of topics presented as noted by education test scores.  Learning Barriers/Preferences:  Learning Barriers/Preferences - 06/02/21 1426       Learning Barriers/Preferences   Learning Barriers None    Learning Preferences None             General Cardiac Education Topics:  AED/CPR: - Group verbal and written instruction with the use of models to demonstrate the basic use of the AED with the basic ABC's of resuscitation.   Anatomy and Cardiac Procedures: - Group verbal and visual presentation and models provide information about basic cardiac anatomy and function. Reviews the testing methods done to diagnose heart disease and the outcomes of the test results. Describes the treatment choices: Medical Management, Angioplasty, or Coronary Bypass Surgery for treating various heart conditions including Myocardial Infarction, Angina, Valve Disease, and Cardiac Arrhythmias.  Written material given at graduation. Flowsheet Row Cardiac Rehab from 09/14/2016 in [facility] Cardiac and Pulmonary Rehab  Date 08/24/16  Educator CE  Instruction Review Code (retired) 2- meets goals/outcomes       Medication Safety: - Group verbal and visual instruction to review commonly prescribed medications for heart and lung disease. Reviews the medication, class of the drug, and  side effects. Includes the steps to properly store meds and maintain the prescription regimen.  Written material given at graduation. Flowsheet Row Cardiac Rehab from 09/14/2016 in [facility] Cardiac and Pulmonary Rehab  Date 09/07/16  Educator SB  Instruction Review Code (retired) 2- meets goals/outcomes       Intimacy: - Group verbal instruction through game format to discuss how heart and lung disease can affect sexual intimacy. Written material given at graduation.. Flowsheet Row Cardiac Rehab from 09/14/2016 in [facility] Cardiac and Pulmonary Rehab  Date 09/14/16  Educator [place]  Instruction Review Code (retired) 2- meets goals/outcomes       Know Your Numbers and Heart Failure: - Group verbal and visual instruction to discuss disease risk factors for cardiac and pulmonary disease and treatment options.  Reviews associated critical values for Overweight/Obesity, Hypertension, Cholesterol, and Diabetes.  Discusses basics of heart failure: signs/symptoms and treatments.  Introduces Heart Failure Zone chart for action plan for heart failure.  Written material given at graduation. Flowsheet Row Cardiac Rehab from 09/14/2016 in [facility] Cardiac and Pulmonary Rehab  Date 08/24/16  Educator CE  Instruction Review Code (retired) 2- meets goals/outcomes       Infection Prevention: - Provides verbal and written material to individual with discussion of infection control including proper hand washing and proper equipment cleaning during exercise session. Flowsheet Row Cardiac  Rehab from 06/11/2021 in [facility] Cardiac and Pulmonary Rehab  Education need identified 06/11/21  Date 06/11/21  Educator [place]  Instruction Review Code 1- Verbalizes Understanding       Falls Prevention: - Provides verbal and written material to individual with discussion of falls prevention and safety. Flowsheet Row Cardiac Rehab from 06/11/2021 in [facility] Cardiac and Pulmonary Rehab  Education need identified 06/11/21  Date  06/11/21  Educator [place]  Instruction Review Code 1- Verbalizes Understanding       Other: -Provides group and verbal instruction on various topics (see comments)   Knowledge Questionnaire Score:  Knowledge Questionnaire Score - 08/26/21 1230       Knowledge Questionnaire Score   Pre Score 25/26: Exercise    Post Score 17/26 didnt answer all questions             Core Components/Risk Factors/Patient Goals at Admission:  Personal Goals and Risk Factors at Admission - 06/17/21 1119       Core Components/Risk Factors/Patient Goals on Admission    Weight Management Yes;Obesity;Weight Loss    Intervention Weight Management: Develop a combined nutrition and exercise program designed to reach desired caloric intake, while maintaining appropriate intake of nutrient and fiber, sodium and fats, and appropriate energy expenditure required for the weight goal.;Weight Management: Provide education and appropriate resources to help participant work on and attain dietary goals.;Weight Management/Obesity: Establish reasonable short term and long term weight goals.;Obesity: Provide education and appropriate resources to help participant work on and attain dietary goals.    Admit Weight 169 lb 9.6 oz (76.9 kg)    Goal Weight: Short Term 164 lb (74.4 kg)    Goal Weight: Long Term 160 lb (72.6 kg)    Expected Outcomes Short Term: Continue to assess and modify interventions until short term weight is achieved;Long Term: Adherence to nutrition and physical activity/exercise program aimed toward attainment of established weight goal;Weight Loss: Understanding of general recommendations for a balanced deficit meal plan, which promotes 1-2 lb weight loss per week and includes a negative energy balance of [phone] kcal/d;Understanding recommendations for meals to include 15-35% energy as protein, 25-35% energy from fat, 35-60% energy from carbohydrates, less than 240m of dietary cholesterol, 20-35 gm of total  fiber daily;Understanding of distribution of calorie intake throughout the day with the consumption of 4-5 meals/snacks    Hypertension Yes    Intervention Provide education on lifestyle modifcations including regular physical activity/exercise, weight management, moderate sodium restriction and increased consumption of fresh fruit, vegetables, and low fat dairy, alcohol moderation, and smoking cessation.;Monitor prescription use compliance.    Expected Outcomes Short Term: Continued assessment and intervention until BP is < 140/93mHG in hypertensive participants. < 130/8028mG in hypertensive participants with diabetes, heart failure or chronic kidney disease.;Long Term: Maintenance of blood pressure at goal levels.    Lipids Yes    Intervention Provide education and support for participant on nutrition & aerobic/resistive exercise along with prescribed medications to achieve LDL <42m82mDL >40mg16m Expected Outcomes Short Term: Participant states understanding of desired cholesterol values and is compliant with medications prescribed. Participant is following exercise prescription and nutrition guidelines.;Long Term: Cholesterol controlled with medications as prescribed, with individualized exercise RX and with personalized nutrition plan. Value goals: LDL < 42mg,22m > 40 mg.             Education:Diabetes - Individual verbal and written instruction to review signs/symptoms of diabetes, desired ranges of glucose level fasting, after meals and  with exercise. Acknowledge that pre and post exercise glucose checks will be done for 3 sessions at entry of program.   Core Components/Risk Factors/Patient Goals Review:   Goals and Risk Factor Review     Row Name 08/05/21 1126             Core Components/Risk Factors/Patient Goals Review   Personal Goals Review Weight Management/Obesity;Hypertension       Review Charae is doing well in rehab. She is working on weight loss and trending down  for most part.  Her pressures are the best they have ever been.       Expected Outcomes Short: Continue to work on weight loss Long: Continue to monitor risk factors                Core Components/Risk Factors/Patient Goals at Discharge (Final Review):   Goals and Risk Factor Review - 08/05/21 1126       Core Components/Risk Factors/Patient Goals Review   Personal Goals Review Weight Management/Obesity;Hypertension    Review Aubrielle is doing well in rehab. She is working on weight loss and trending down for most part.  Her pressures are the best they have ever been.    Expected Outcomes Short: Continue to work on weight loss Long: Continue to monitor risk factors             ITP Comments:  ITP Comments     Row Name 06/02/21 1402 06/11/21 1213 06/17/21 1101 06/18/21 0654 07/16/21 0602   ITP Comments Initial telephone orientation completed. Diagnosis can be found in [facility] 6/29. EP orientation scheduled for Wednesday 8/3 at [place] presented to rehab for gym orientation- 6MWT was not completed as patient had open-toes shoes. Will complete 6MWT next week on 8/9. First full day of exercise!  Patient was oriented to gym and equipment including functions, settings, policies, and procedures.  Patient's individual exercise prescription and treatment plan were reviewed.  All starting workloads were established based on the results of the 6 minute walk test done at initial orientation visit.  The plan for exercise progression was also introduced and progression will be customized based on patient's performance and goals. 30 Day review completed. Medical Director ITP review done, changes made as directed, and signed approval by Medical Director.    New to program 30 Day review completed. Medical Director ITP review done, changes made as directed, and signed approval by Medical Director.    [place] Name 07/16/21 1519 08/13/21 0804 08/26/21 1115       ITP Comments Out since 8/16 with COVID and has yet  to return to rehab. 30 day review completed. ITP sent to Dr. Emily Filbert, Medical Director of Cardiac Rehab. Continue with ITP unless changes are made by physician. Aubria graduated today from  rehab with 14 sessions completed.  Details of the patient's exercise prescription and what She needs to do in order to continue the prescription and progress were discussed with patient.  Patient was given a copy of prescription and goals.  Patient verbalized understanding.  Tacha plans to continue to exercise by walking and using staff videos.              Comments: discharge ITP

## 2021-08-26 NOTE — Progress Notes (Signed)
Daily Session Note  Patient Details  Name: Morgan Moore MRN: [id number] Date of Birth: 12/02/1955 Referring Provider:   Flowsheet Row Cardiac Rehab from 06/17/2021 in [facility] Cardiac and Pulmonary Rehab  Referring Provider [place], Dwayne MD       Encounter Date: 08/26/2021  Check In:  Session Check In - 08/26/21 1113       Check-In   Supervising physician immediately available to respond to emergencies See telemetry face sheet for immediately available ER MD    Location [facility]-Cardiac & Pulmonary Rehab    Staff Present  , MPA, RN;Jessica Hawkins, MA, RCEP, CCRP, CCET;Amanda Sommer, BA, ACSM CEP, Exercise Physiologist    Virtual Visit No    Medication changes reported     No    Fall or balance concerns reported    No    Tobacco Cessation No Change    Warm-up and Cool-down Performed on first and last piece of equipment    Resistance Training Performed Yes    VAD Patient? No    PAD/SET Patient? No      Pain Assessment   Currently in Pain? No/denies                Social History   Tobacco Use  Smoking Status Never  Smokeless Tobacco Never    Goals Met:  Independence with exercise equipment Exercise tolerated well No report of concerns or symptoms today Strength training completed today  Goals Unmet:  Not Applicable  Comments:  [name] graduated today from  rehab with 14 sessions completed.  Details of the patient's exercise prescription and what She needs to do in order to continue the prescription and progress were discussed with patient.  Patient was given a copy of prescription and goals.  Patient verbalized understanding.  Morgan Moore plans to continue to exercise by walking and using staff videos.    Dr. Mark Miller is Medical Director for [facility].  Dr. Fuad Aleskerov is Medical Director for [facility]. 

## 2021-09-04 DIAGNOSIS — Z8616 Personal history of COVID-19: Secondary | ICD-10-CM | POA: Diagnosis not present

## 2021-09-04 DIAGNOSIS — I25118 Atherosclerotic heart disease of native coronary artery with other forms of angina pectoris: Secondary | ICD-10-CM | POA: Diagnosis not present

## 2021-09-04 DIAGNOSIS — Z955 Presence of coronary angioplasty implant and graft: Secondary | ICD-10-CM | POA: Diagnosis not present

## 2021-09-04 DIAGNOSIS — I471 Supraventricular tachycardia: Secondary | ICD-10-CM | POA: Diagnosis not present

## 2021-09-04 DIAGNOSIS — R001 Bradycardia, unspecified: Secondary | ICD-10-CM | POA: Diagnosis not present

## 2021-09-04 DIAGNOSIS — I5189 Other ill-defined heart diseases: Secondary | ICD-10-CM | POA: Diagnosis not present

## 2021-10-01 ENCOUNTER — Telehealth: Payer: Self-pay | Admitting: *Deleted

## 2021-10-01 DIAGNOSIS — E7800 Pure hypercholesterolemia, unspecified: Secondary | ICD-10-CM

## 2021-10-01 DIAGNOSIS — I10 Essential (primary) hypertension: Secondary | ICD-10-CM

## 2021-10-01 DIAGNOSIS — F439 Reaction to severe stress, unspecified: Secondary | ICD-10-CM

## 2021-10-01 NOTE — Telephone Encounter (Signed)
Future labs placed. 

## 2021-10-01 NOTE — Telephone Encounter (Signed)
Please place future orders for lab appt.  

## 2021-10-07 ENCOUNTER — Other Ambulatory Visit (INDEPENDENT_AMBULATORY_CARE_PROVIDER_SITE_OTHER): Payer: Medicare PPO

## 2021-10-07 ENCOUNTER — Other Ambulatory Visit: Payer: Self-pay

## 2021-10-07 DIAGNOSIS — E7800 Pure hypercholesterolemia, unspecified: Secondary | ICD-10-CM

## 2021-10-07 DIAGNOSIS — I10 Essential (primary) hypertension: Secondary | ICD-10-CM | POA: Diagnosis not present

## 2021-10-07 LAB — CBC WITH DIFFERENTIAL/PLATELET
Basophils Absolute: 0.1 10*3/uL (ref 0.0–0.1)
Basophils Relative: 0.7 % (ref 0.0–3.0)
Eosinophils Absolute: 0.2 10*3/uL (ref 0.0–0.7)
Eosinophils Relative: 2.5 % (ref 0.0–5.0)
HCT: 39.7 % (ref 36.0–46.0)
Hemoglobin: 13.5 g/dL (ref 12.0–15.0)
Lymphocytes Relative: 15.9 % (ref 12.0–46.0)
Lymphs Abs: 1.3 10*3/uL (ref 0.7–4.0)
MCHC: 34.1 g/dL (ref 30.0–36.0)
MCV: 94.2 fl (ref 78.0–100.0)
Monocytes Absolute: 0.7 10*3/uL (ref 0.1–1.0)
Monocytes Relative: 8.3 % (ref 3.0–12.0)
Neutro Abs: 6.1 10*3/uL (ref 1.4–7.7)
Neutrophils Relative %: 72.6 % (ref 43.0–77.0)
Platelets: 315 10*3/uL (ref 150.0–400.0)
RBC: 4.21 Mil/uL (ref 3.87–5.11)
RDW: 14 % (ref 11.5–15.5)
WBC: 8.4 10*3/uL (ref 4.0–10.5)

## 2021-10-07 LAB — LIPID PANEL
Cholesterol: 113 mg/dL (ref 0–200)
HDL: 53.4 mg/dL (ref >39.00)
LDL Cholesterol: 36 mg/dL (ref 0–99)
NonHDL: 59.83
Total CHOL/HDL Ratio: 2
Triglycerides: 118 mg/dL (ref 0.0–149.0)
VLDL: 23.6 mg/dL (ref 0.0–40.0)

## 2021-10-07 LAB — HEPATIC FUNCTION PANEL
ALT: 18 U/L (ref 0–35)
AST: 17 U/L (ref 0–37)
Albumin: 4.4 g/dL (ref 3.5–5.2)
Alkaline Phosphatase: 82 U/L (ref 39–117)
Bilirubin, Direct: 0.1 mg/dL (ref 0.0–0.3)
Total Bilirubin: 0.6 mg/dL (ref 0.2–1.2)
Total Protein: 6.8 g/dL (ref 6.0–8.3)

## 2021-10-07 LAB — BASIC METABOLIC PANEL
BUN: 15 mg/dL (ref 6–23)
CO2: 28 mEq/L (ref 19–32)
Calcium: 9.7 mg/dL (ref 8.4–10.5)
Chloride: 104 mEq/L (ref 96–112)
Creatinine, Ser: 0.9 mg/dL (ref 0.40–1.20)
GFR: 66.55 mL/min (ref >60.00)
Glucose, Bld: 89 mg/dL (ref 70–99)
Potassium: 4.3 mEq/L (ref 3.5–5.1)
Sodium: 141 mEq/L (ref 135–145)

## 2021-10-07 LAB — TSH: TSH: 3.19 u[IU]/mL (ref 0.35–5.50)

## 2021-10-09 ENCOUNTER — Ambulatory Visit (INDEPENDENT_AMBULATORY_CARE_PROVIDER_SITE_OTHER): Payer: Medicare PPO | Admitting: Internal Medicine

## 2021-10-09 ENCOUNTER — Other Ambulatory Visit: Payer: Self-pay

## 2021-10-09 DIAGNOSIS — I10 Essential (primary) hypertension: Secondary | ICD-10-CM

## 2021-10-09 DIAGNOSIS — I779 Disorder of arteries and arterioles, unspecified: Secondary | ICD-10-CM

## 2021-10-09 DIAGNOSIS — E7800 Pure hypercholesterolemia, unspecified: Secondary | ICD-10-CM

## 2021-10-09 DIAGNOSIS — F32A Depression, unspecified: Secondary | ICD-10-CM | POA: Diagnosis not present

## 2021-10-09 DIAGNOSIS — I2510 Atherosclerotic heart disease of native coronary artery without angina pectoris: Secondary | ICD-10-CM | POA: Diagnosis not present

## 2021-10-09 DIAGNOSIS — K50919 Crohn's disease, unspecified, with unspecified complications: Secondary | ICD-10-CM

## 2021-10-09 DIAGNOSIS — Z0000 Encounter for general adult medical examination without abnormal findings: Secondary | ICD-10-CM

## 2021-10-09 DIAGNOSIS — D509 Iron deficiency anemia, unspecified: Secondary | ICD-10-CM

## 2021-10-09 DIAGNOSIS — K219 Gastro-esophageal reflux disease without esophagitis: Secondary | ICD-10-CM | POA: Diagnosis not present

## 2021-10-09 DIAGNOSIS — F439 Reaction to severe stress, unspecified: Secondary | ICD-10-CM | POA: Diagnosis not present

## 2021-10-09 DIAGNOSIS — E559 Vitamin D deficiency, unspecified: Secondary | ICD-10-CM

## 2021-10-09 DIAGNOSIS — R590 Localized enlarged lymph nodes: Secondary | ICD-10-CM

## 2021-10-09 DIAGNOSIS — I700 Atherosclerosis of aorta: Secondary | ICD-10-CM

## 2021-10-09 MED ORDER — ONDANSETRON 4 MG PO TBDP
4.0000 mg | ORAL_TABLET | Freq: Every day | ORAL | 0 refills | Status: DC | PRN
Start: 2021-10-09 — End: 2024-06-21

## 2021-10-09 NOTE — Progress Notes (Addendum)
Patient ID: Morgan Moore, female   DOB: 12/14/1955, 65 y.o.   MRN: [id number]   Subjective:    Patient ID: Morgan Moore, female    DOB: 1956-09-12, 65 y.o.   MRN: [id number]  This visit occurred during the SARS-CoV-2 public health emergency.  Safety protocols were in place, including screening questions prior to the visit, additional usage of staff PPE, and extensive cleaning of exam room while observing appropriate contact time as indicated for disinfecting solutions.   Patient here for her physical exam.   Chief Complaint  Patient presents with  . Annual Exam    Physical    .   HPI Recent CTA - significant blockages.  S/p cath with PCI and stenting at [facility]. Followed by Dr Clayborn Bigness.  Attended cardiac rehab.  Overall stable.  Remains on repatha.  Overall stable.  S/p covid in 06/2021.  No residual problems.  No cough or congestion.  No acid reflux reported.  No abdominal pain or bowel change reported.     Past Medical History:  Diagnosis Date  . Allergy   . Anemia   . Anxiety   . Chicken pox   . Complication of anesthesia    vomitting  . Coronary artery disease   . Crohn disease ([place])   . DVT (deep venous thrombosis) ([place]) 2017  . Dyspnea    on excertion  . Dysrhythmia 2019   palpitations  . GERD (gastroesophageal reflux disease)   . Heart disease   . Heart murmur   . Heart murmur   . History of kidney stones   . Hyperlipidemia   . Hypertension   . Inflammatory bowel disease   . Migraines    hormonal, puberty  . Myocardial infarction ([place])   . Phlebitis   . PONV (postoperative nausea and vomiting)    Past Surgical History:  Procedure Laterality Date  . BREAST BIOPSY Left 07/05/2019   FIBROEPITHELIAL lesion/neg  . BREAST CYST ASPIRATION     unsure of side  . CARPAL TUNNEL RELEASE Right 12/02/2017   Procedure: CARPAL TUNNEL RELEASE ENDOSCOPIC;  Surgeon: Corky Mull, MD;  Location: [facility] ORS;  Service: Orthopedics;  Laterality: Right;  . CORONARY  ANGIOPLASTY WITH STENT PLACEMENT  2017   x 2  . EYE SURGERY Bilateral    cataract surgery  . GANGLION CYST EXCISION Right 12/02/2017   Procedure: REMOVAL GANGLION OF WRIST;  Surgeon: Corky Mull, MD;  Location: [facility] ORS;  Service: Orthopedics;  Laterality: Right;  . heart murmur    . HYSTEROSCOPY WITH D & C N/A 04/19/2020   Procedure: DILATATION AND CURETTAGE /HYSTEROSCOPY, POSSIBLE  POLYPECTOMY;  Surgeon: Benjaman Kindler, MD;  Location: [facility] ORS;  Service: Gynecology;  Laterality: N/A;  . TONSILLECTOMY AND ADENOIDECTOMY  1962  . TUBAL LIGATION  1993  . tubaligation  1990   Family History  Problem Relation Age of Onset  . Heart disease Father   . Heart disease Brother   . Cancer Maternal Aunt   . Breast cancer Maternal Aunt   . Stroke Mother   . Kidney disease Neg Hx   . GU problems Neg Hx   . Kidney cancer Neg Hx    Social History   Socioeconomic History  . Marital status: Single    Spouse name: Not on file  . Number of children: Not on file  . Years of education: Not on file  . Highest education level: Not on file  Occupational History  . Not on file  Tobacco Use  . Smoking status: Never  . Smokeless tobacco: Never  Vaping Use  . Vaping Use: Never used  Substance and Sexual Activity  . Alcohol use: No    Alcohol/week: 0.0 standard drinks  . Drug use: No  . Sexual activity: Not on file  Other Topics Concern  . Not on file  Social History Narrative   Had 2 kids. Daughter died in 01-23-2012 son living    Former Chartered loss adjuster    Social Determinants of Health   Financial Resource Strain: [place]   . Difficulty of Paying Living Expenses: Not hard at all  Food Insecurity: No Food Insecurity  . Worried About Charity fundraiser in the Last Year: Never true  . Ran Out of Food in the Last Year: Never true  Transportation Needs: No Transportation Needs  . Lack of Transportation (Medical): No  . Lack of Transportation (Non-Medical): No  Physical Activity:  Sufficiently Active  . Days of Exercise per Week: 5 days  . Minutes of Exercise per Session: 30 min  Stress: No Stress Concern Present  . Feeling of Stress : Not at all  Social Connections: Unknown  . Frequency of Communication with Friends and Family: More than three times a week  . Frequency of Social Gatherings with Friends and Family: More than three times a week  . Attends Religious Services: More than 4 times per year  . Active Member of Clubs or Organizations: Yes  . Attends Archivist Meetings: More than 4 times per year  . Marital Status: Not on file     Review of Systems  Constitutional:  Negative for appetite change and unexpected weight change.  HENT:  Negative for congestion, sinus pressure and sore throat.   Eyes:  Negative for pain and visual disturbance.  Respiratory:  Negative for cough, chest tightness and shortness of breath.   Cardiovascular:  Negative for palpitations and leg swelling.       No significant chest pain.   Gastrointestinal:  Negative for abdominal pain, diarrhea, nausea and vomiting.  Genitourinary:  Negative for difficulty urinating and dysuria.  Musculoskeletal:  Negative for joint swelling and myalgias.  Skin:  Negative for color change and rash.  Neurological:  Negative for dizziness, light-headedness and headaches.  Hematological:  Negative for adenopathy. Does not bruise/bleed easily.  Psychiatric/Behavioral:  Negative for agitation and dysphoric mood.       Objective:     BP 124/66 (BP Location: Left Arm, Patient Position: Sitting, Cuff Size: Normal)   Pulse 74   Temp (!) 96.6 F (35.9 C) (Temporal)   Ht 5' 1.5" (1.562 m)   Wt 167 lb (75.8 kg)   SpO2 97%   BMI 31.04 kg/m  Wt Readings from Last 3 Encounters:  10/09/21 167 lb (75.8 kg)  08/26/21 167 lb 11.2 oz (76.1 kg)  08/01/21 167 lb (75.8 kg)    Physical Exam Vitals reviewed.  Constitutional:      General: She is not in acute distress.    Appearance: Normal  appearance. She is well-developed.  HENT:     Head: Normocephalic and atraumatic.     Right Ear: External ear normal.     Left Ear: External ear normal.  Eyes:     General: No scleral icterus.       Right eye: No discharge.        Left eye: No discharge.     Conjunctiva/sclera: Conjunctivae normal.  Neck:     Thyroid:  No thyromegaly.     Comments: Posterior neck - palpable lymph node.  Nontender.  Cardiovascular:     Rate and Rhythm: Normal rate and regular rhythm.  Pulmonary:     Effort: No tachypnea, accessory muscle usage or respiratory distress.     Breath sounds: Normal breath sounds. No decreased breath sounds or wheezing.  Chest:  Breasts:    Right: No inverted nipple, mass, nipple discharge or tenderness (no axillary adenopathy).     Left: No inverted nipple, mass, nipple discharge or tenderness (no axilarry adenopathy).  Abdominal:     General: Bowel sounds are normal.     Palpations: Abdomen is soft.     Tenderness: There is no abdominal tenderness.  Musculoskeletal:        General: No swelling or tenderness.     Cervical back: Neck supple.  Lymphadenopathy:     Cervical: No cervical adenopathy.  Skin:    Findings: No erythema or rash.  Neurological:     Mental Status: She is alert and oriented to person, place, and time.  Psychiatric:        Mood and Affect: Mood normal.        Behavior: Behavior normal.     Outpatient Encounter Medications as of 10/09/2021  Medication Sig  . aspirin EC 81 MG tablet Take 81 mg by mouth daily.  . Cholecalciferol 25 MCG (1000 UT) tablet Take 1,000 Units by mouth 2 (two) times daily.  . ciclopirox (PENLAC) 8 % solution Apply topically at bedtime. Apply over nail and surrounding skin. Apply daily over previous coat. After seven (7) days, may remove with alcohol and continue cycle.  . clopidogrel (PLAVIX) 75 MG tablet Take 75 mg by mouth daily.  . cyanocobalamin 1000 MCG tablet Take 1,000 mcg by mouth daily at 12 noon.   .  diphenhydrAMINE (BENADRYL) 12.5 MG/5ML elixir Take 12.5 mg by mouth every 6 (six) hours as needed (allergic reaction).  Marland Kitchen EPINEPHrine 0.3 mg/0.3 mL IJ SOAJ injection Inject 0.3 mg into the muscle once.   . escitalopram (LEXAPRO) 20 MG tablet TAKE 1 TABLET BY MOUTH ONCE DAILY. (Patient taking differently: Take 20 mg by mouth at bedtime.)  . Fe Fum-FePoly-Vit C-Vit B3 (INTEGRA) 62.5-62.5-40-3 MG CAPS TAKE (1) CAPSULE BY MOUTH ONCE DAILY.  . Melatonin-Pyridoxine 3-10 MG TABS Take 1 tablet by mouth at bedtime.  . mesalamine (LIALDA) 1.2 g EC tablet Take 1.2 g by mouth 3 (three) times daily with meals.   . mupirocin ointment (BACTROBAN) 2 % Apply to affected area bid (Patient taking differently: Apply 1 application topically 2 (two) times daily as needed (rash in head).)  . nitroGLYCERIN (NITROSTAT) 0.4 MG SL tablet DISSOLVE (1) TABLET UNDER TONGUE AS NEEDED TO RELIEVE CHEST PAIN. MAYREPEAT EVERY 5 MINUTES.  Marland Kitchen prednisoLONE acetate (PRED FORTE) 1 % ophthalmic suspension Place 1 drop into both eyes 2 (two) times daily as needed (for inflammation).  . rosuvastatin (CRESTOR) 5 MG tablet Take 1-2 tablets q day.  . verapamil (CALAN-SR) 120 MG CR tablet Take 120 mg by mouth every evening.   . [DISCONTINUED] ondansetron (ZOFRAN-ODT) 4 MG disintegrating tablet Take 4 mg by mouth daily as needed for nausea.   . ondansetron (ZOFRAN-ODT) 4 MG disintegrating tablet Take 1 tablet (4 mg total) by mouth daily as needed for nausea.  . pantoprazole (PROTONIX) 20 MG tablet Take 20 mg by mouth daily.   . [DISCONTINUED] rizatriptan (MAXALT) 5 MG tablet USE AS DIRECTED (Patient not taking: Reported on 10/09/2021)  No facility-administered encounter medications on file as of 10/09/2021.     Lab Results  Component Value Date   WBC 8.4 10/07/2021   HGB 13.5 10/07/2021   HCT 39.7 10/07/2021   PLT 315.0 10/07/2021   GLUCOSE 89 10/07/2021   CHOL 113 10/07/2021   TRIG 118.0 10/07/2021   HDL 53.40 10/07/2021   LDLCALC 36  10/07/2021   ALT 18 10/07/2021   AST 17 10/07/2021   NA 141 10/07/2021   K 4.3 10/07/2021   CL 104 10/07/2021   CREATININE 0.90 10/07/2021   BUN 15 10/07/2021   CO2 28 10/07/2021   TSH 3.19 10/07/2021   INR 1.0 12/23/2017    Mammogram 3D SCREEN BREAST BILATERAL  Result Date: 08/11/2021 CLINICAL DATA:  Screening. EXAM: DIGITAL SCREENING BILATERAL MAMMOGRAM WITH TOMOSYNTHESIS AND CAD TECHNIQUE: Bilateral screening digital craniocaudal and mediolateral oblique mammograms were obtained. Bilateral screening digital breast tomosynthesis was performed. The images were evaluated with computer-aided detection. COMPARISON:  Previous exam(s). ACR Breast Density Category c: The breast tissue is heterogeneously dense, which may obscure small masses. FINDINGS: There are no findings suspicious for malignancy. IMPRESSION: No mammographic evidence of malignancy. A result letter of this screening mammogram will be mailed directly to the patient. RECOMMENDATION: Screening mammogram in one year. (Code:SM-B-01Y) BI-RADS CATEGORY  1: Negative. Electronically Signed   By: Margarette Canada M.D.   On: 08/11/2021 12:22      Assessment & Plan:   Problem List Items Addressed This Visit     Anemia    Follow cbc.       Aortic atherosclerosis (HCC)    Continue crestor.        CAD (coronary artery disease)    Continue crestor and aspirin.  Continue risk factor modification.  Seeing cardiology.       Carotid artery disease ([place])    Continue crestor and aspirin.  Continue risk factor modification.  Seeing cardiology.       Crohn disease ([place])    Has been on lialda.  Bowels have been stable.  Follow.       Enlarged lymph node in neck    Posterior neck - palpable lymph node.  Nontender.  Will call with update - 1-2 weeks.  If persistent, will require ENT evaluation.       Essential hypertension    Blood pressure as outlined.  On calan.  Follow pressures.  Follow metabolic panel.       Relevant Orders    Basic metabolic panel   GERD (gastroesophageal reflux disease)    No upper symptoms reported.  On protonix.       Relevant Medications   ondansetron (ZOFRAN-ODT) 4 MG disintegrating tablet   Health care maintenance    Physical today 10/09/21.  Mammogram 06/19/19 - Birads I.  Colonoscopy 08/2018.        Hypercholesterolemia    Continue crestor.  Low cholesterol diet and exercise. Follow lipid panel and liver function tests.        Relevant Orders   Hepatic function panel   Lipid panel   Mild depression    Continue lexapro.  Has good support.       Stress    Appears to be handling things relatively well.  Continue lexapro.       Vitamin D deficiency    Continue vitamin D supplements.         Einar Pheasant, MD

## 2021-10-09 NOTE — Assessment & Plan Note (Signed)
Physical today 10/09/21.  Mammogram 06/19/19 - Birads I.  Colonoscopy 08/2018.

## 2021-10-12 ENCOUNTER — Encounter: Payer: Self-pay | Admitting: Internal Medicine

## 2021-10-12 DIAGNOSIS — R590 Localized enlarged lymph nodes: Secondary | ICD-10-CM | POA: Insufficient documentation

## 2021-10-12 DIAGNOSIS — I700 Atherosclerosis of aorta: Secondary | ICD-10-CM | POA: Insufficient documentation

## 2021-10-12 NOTE — Assessment & Plan Note (Signed)
Continue crestor.  Low cholesterol diet and exercise. Follow lipid panel and liver function tests.

## 2021-10-12 NOTE — Assessment & Plan Note (Signed)
Continue vitamin D supplements.

## 2021-10-12 NOTE — Assessment & Plan Note (Signed)
Follow cbc.  

## 2021-10-12 NOTE — Assessment & Plan Note (Signed)
Appears to be handling things relatively well.  Continue lexapro.

## 2021-10-12 NOTE — Assessment & Plan Note (Signed)
Continue lexapro.  Has good support.

## 2021-10-12 NOTE — Assessment & Plan Note (Signed)
Continue crestor and aspirin.  Continue risk factor modification.  Seeing cardiology.

## 2021-10-12 NOTE — Assessment & Plan Note (Signed)
Has been on lialda.  Bowels have been stable.  Follow.

## 2021-10-12 NOTE — Assessment & Plan Note (Signed)
Posterior neck - palpable lymph node.  Nontender.  Will call with update - 1-2 weeks.  If persistent, will require ENT evaluation.

## 2021-10-12 NOTE — Assessment & Plan Note (Signed)
Continue crestor 

## 2021-10-12 NOTE — Assessment & Plan Note (Signed)
Blood pressure as outlined.  On calan.  Follow pressures.  Follow metabolic panel.

## 2021-10-12 NOTE — Assessment & Plan Note (Signed)
No upper symptoms reported.  On protonix.

## 2021-10-26 DIAGNOSIS — Z87442 Personal history of urinary calculi: Secondary | ICD-10-CM | POA: Diagnosis not present

## 2021-10-26 DIAGNOSIS — E785 Hyperlipidemia, unspecified: Secondary | ICD-10-CM | POA: Diagnosis not present

## 2021-10-26 DIAGNOSIS — L539 Erythematous condition, unspecified: Secondary | ICD-10-CM | POA: Diagnosis not present

## 2021-10-26 DIAGNOSIS — I252 Old myocardial infarction: Secondary | ICD-10-CM | POA: Diagnosis not present

## 2021-10-26 DIAGNOSIS — N200 Calculus of kidney: Secondary | ICD-10-CM | POA: Diagnosis not present

## 2021-10-26 DIAGNOSIS — Z955 Presence of coronary angioplasty implant and graft: Secondary | ICD-10-CM | POA: Diagnosis not present

## 2021-10-26 DIAGNOSIS — B029 Zoster without complications: Secondary | ICD-10-CM | POA: Diagnosis not present

## 2021-10-26 DIAGNOSIS — K5790 Diverticulosis of intestine, part unspecified, without perforation or abscess without bleeding: Secondary | ICD-10-CM | POA: Diagnosis not present

## 2021-10-26 DIAGNOSIS — K5090 Crohn's disease, unspecified, without complications: Secondary | ICD-10-CM | POA: Diagnosis not present

## 2021-10-26 DIAGNOSIS — R21 Rash and other nonspecific skin eruption: Secondary | ICD-10-CM | POA: Diagnosis not present

## 2021-10-26 DIAGNOSIS — I10 Essential (primary) hypertension: Secondary | ICD-10-CM | POA: Diagnosis not present

## 2021-10-26 DIAGNOSIS — K5730 Diverticulosis of large intestine without perforation or abscess without bleeding: Secondary | ICD-10-CM | POA: Diagnosis not present

## 2021-10-26 DIAGNOSIS — M549 Dorsalgia, unspecified: Secondary | ICD-10-CM | POA: Diagnosis not present

## 2021-10-26 DIAGNOSIS — R1032 Left lower quadrant pain: Secondary | ICD-10-CM | POA: Diagnosis not present

## 2021-11-19 DIAGNOSIS — H00011 Hordeolum externum right upper eyelid: Secondary | ICD-10-CM | POA: Diagnosis not present

## 2021-11-27 DIAGNOSIS — R002 Palpitations: Secondary | ICD-10-CM | POA: Diagnosis not present

## 2021-11-27 DIAGNOSIS — R0602 Shortness of breath: Secondary | ICD-10-CM | POA: Diagnosis not present

## 2021-11-27 DIAGNOSIS — I10 Essential (primary) hypertension: Secondary | ICD-10-CM | POA: Diagnosis not present

## 2021-11-27 DIAGNOSIS — E669 Obesity, unspecified: Secondary | ICD-10-CM | POA: Diagnosis not present

## 2021-11-27 DIAGNOSIS — Z882 Allergy status to sulfonamides status: Secondary | ICD-10-CM | POA: Diagnosis not present

## 2021-11-27 DIAGNOSIS — I739 Peripheral vascular disease, unspecified: Secondary | ICD-10-CM | POA: Diagnosis not present

## 2021-11-27 DIAGNOSIS — I25110 Atherosclerotic heart disease of native coronary artery with unstable angina pectoris: Secondary | ICD-10-CM | POA: Diagnosis not present

## 2021-11-27 DIAGNOSIS — Z7982 Long term (current) use of aspirin: Secondary | ICD-10-CM | POA: Diagnosis not present

## 2021-11-27 DIAGNOSIS — I471 Supraventricular tachycardia: Secondary | ICD-10-CM | POA: Diagnosis not present

## 2021-11-27 DIAGNOSIS — R079 Chest pain, unspecified: Secondary | ICD-10-CM | POA: Diagnosis not present

## 2021-11-27 DIAGNOSIS — K5090 Crohn's disease, unspecified, without complications: Secondary | ICD-10-CM | POA: Diagnosis not present

## 2021-11-27 DIAGNOSIS — I25118 Atherosclerotic heart disease of native coronary artery with other forms of angina pectoris: Secondary | ICD-10-CM | POA: Diagnosis not present

## 2021-11-27 DIAGNOSIS — R748 Abnormal levels of other serum enzymes: Secondary | ICD-10-CM | POA: Diagnosis not present

## 2021-11-27 DIAGNOSIS — I252 Old myocardial infarction: Secondary | ICD-10-CM | POA: Diagnosis not present

## 2021-11-27 DIAGNOSIS — Z881 Allergy status to other antibiotic agents status: Secondary | ICD-10-CM | POA: Diagnosis not present

## 2021-11-27 DIAGNOSIS — Z20822 Contact with and (suspected) exposure to covid-19: Secondary | ICD-10-CM | POA: Diagnosis not present

## 2021-11-27 DIAGNOSIS — I25119 Atherosclerotic heart disease of native coronary artery with unspecified angina pectoris: Secondary | ICD-10-CM | POA: Diagnosis not present

## 2021-11-27 DIAGNOSIS — E785 Hyperlipidemia, unspecified: Secondary | ICD-10-CM | POA: Diagnosis not present

## 2021-11-27 DIAGNOSIS — Z6829 Body mass index (BMI) 29.0-29.9, adult: Secondary | ICD-10-CM | POA: Diagnosis not present

## 2021-11-27 DIAGNOSIS — R0789 Other chest pain: Secondary | ICD-10-CM | POA: Diagnosis not present

## 2021-11-27 DIAGNOSIS — Z7902 Long term (current) use of antithrombotics/antiplatelets: Secondary | ICD-10-CM | POA: Diagnosis not present

## 2021-11-27 DIAGNOSIS — R531 Weakness: Secondary | ICD-10-CM | POA: Diagnosis not present

## 2021-11-27 DIAGNOSIS — I214 Non-ST elevation (NSTEMI) myocardial infarction: Secondary | ICD-10-CM | POA: Diagnosis not present

## 2021-11-27 DIAGNOSIS — I779 Disorder of arteries and arterioles, unspecified: Secondary | ICD-10-CM | POA: Diagnosis not present

## 2021-11-27 DIAGNOSIS — T82855A Stenosis of coronary artery stent, initial encounter: Secondary | ICD-10-CM | POA: Diagnosis not present

## 2021-11-27 DIAGNOSIS — Z955 Presence of coronary angioplasty implant and graft: Secondary | ICD-10-CM | POA: Diagnosis not present

## 2021-11-27 DIAGNOSIS — F32A Depression, unspecified: Secondary | ICD-10-CM | POA: Diagnosis not present

## 2021-11-27 DIAGNOSIS — Z885 Allergy status to narcotic agent status: Secondary | ICD-10-CM | POA: Diagnosis not present

## 2021-11-27 DIAGNOSIS — K5010 Crohn's disease of large intestine without complications: Secondary | ICD-10-CM | POA: Diagnosis not present

## 2021-11-28 DIAGNOSIS — T82855A Stenosis of coronary artery stent, initial encounter: Secondary | ICD-10-CM | POA: Insufficient documentation

## 2021-11-28 HISTORY — PX: CORONARY ANGIOPLASTY WITH STENT PLACEMENT: SHX49

## 2021-12-03 DIAGNOSIS — I214 Non-ST elevation (NSTEMI) myocardial infarction: Secondary | ICD-10-CM | POA: Diagnosis not present

## 2021-12-03 DIAGNOSIS — R600 Localized edema: Secondary | ICD-10-CM | POA: Diagnosis not present

## 2021-12-03 DIAGNOSIS — I25118 Atherosclerotic heart disease of native coronary artery with other forms of angina pectoris: Secondary | ICD-10-CM | POA: Diagnosis not present

## 2021-12-03 DIAGNOSIS — I5189 Other ill-defined heart diseases: Secondary | ICD-10-CM | POA: Diagnosis not present

## 2021-12-03 DIAGNOSIS — Z955 Presence of coronary angioplasty implant and graft: Secondary | ICD-10-CM | POA: Diagnosis not present

## 2021-12-03 DIAGNOSIS — I471 Supraventricular tachycardia: Secondary | ICD-10-CM | POA: Diagnosis not present

## 2021-12-03 DIAGNOSIS — R001 Bradycardia, unspecified: Secondary | ICD-10-CM | POA: Diagnosis not present

## 2021-12-03 DIAGNOSIS — T82855A Stenosis of coronary artery stent, initial encounter: Secondary | ICD-10-CM | POA: Diagnosis not present

## 2021-12-03 DIAGNOSIS — E782 Mixed hyperlipidemia: Secondary | ICD-10-CM | POA: Diagnosis not present

## 2022-01-22 DIAGNOSIS — I200 Unstable angina: Secondary | ICD-10-CM | POA: Diagnosis not present

## 2022-01-22 DIAGNOSIS — R61 Generalized hyperhidrosis: Secondary | ICD-10-CM | POA: Diagnosis not present

## 2022-01-22 DIAGNOSIS — R000 Tachycardia, unspecified: Secondary | ICD-10-CM | POA: Diagnosis not present

## 2022-01-22 DIAGNOSIS — Z882 Allergy status to sulfonamides status: Secondary | ICD-10-CM | POA: Diagnosis not present

## 2022-01-22 DIAGNOSIS — R519 Headache, unspecified: Secondary | ICD-10-CM | POA: Diagnosis not present

## 2022-01-22 DIAGNOSIS — Z881 Allergy status to other antibiotic agents status: Secondary | ICD-10-CM | POA: Diagnosis not present

## 2022-01-22 DIAGNOSIS — I2510 Atherosclerotic heart disease of native coronary artery without angina pectoris: Secondary | ICD-10-CM | POA: Diagnosis not present

## 2022-01-22 DIAGNOSIS — Z6831 Body mass index (BMI) 31.0-31.9, adult: Secondary | ICD-10-CM | POA: Diagnosis not present

## 2022-01-22 DIAGNOSIS — I25110 Atherosclerotic heart disease of native coronary artery with unstable angina pectoris: Secondary | ICD-10-CM | POA: Diagnosis not present

## 2022-01-22 DIAGNOSIS — I10 Essential (primary) hypertension: Secondary | ICD-10-CM | POA: Diagnosis not present

## 2022-01-22 DIAGNOSIS — E785 Hyperlipidemia, unspecified: Secondary | ICD-10-CM | POA: Diagnosis not present

## 2022-01-22 DIAGNOSIS — R079 Chest pain, unspecified: Secondary | ICD-10-CM | POA: Diagnosis not present

## 2022-01-22 DIAGNOSIS — Z20822 Contact with and (suspected) exposure to covid-19: Secondary | ICD-10-CM | POA: Diagnosis not present

## 2022-01-22 DIAGNOSIS — Z885 Allergy status to narcotic agent status: Secondary | ICD-10-CM | POA: Diagnosis not present

## 2022-01-23 DIAGNOSIS — Z885 Allergy status to narcotic agent status: Secondary | ICD-10-CM | POA: Diagnosis not present

## 2022-01-23 DIAGNOSIS — I739 Peripheral vascular disease, unspecified: Secondary | ICD-10-CM | POA: Diagnosis not present

## 2022-01-23 DIAGNOSIS — Z7982 Long term (current) use of aspirin: Secondary | ICD-10-CM | POA: Diagnosis not present

## 2022-01-23 DIAGNOSIS — Z955 Presence of coronary angioplasty implant and graft: Secondary | ICD-10-CM | POA: Diagnosis not present

## 2022-01-23 DIAGNOSIS — T82855A Stenosis of coronary artery stent, initial encounter: Secondary | ICD-10-CM | POA: Diagnosis not present

## 2022-01-23 DIAGNOSIS — I25119 Atherosclerotic heart disease of native coronary artery with unspecified angina pectoris: Secondary | ICD-10-CM | POA: Diagnosis not present

## 2022-01-23 DIAGNOSIS — R0602 Shortness of breath: Secondary | ICD-10-CM | POA: Diagnosis not present

## 2022-01-23 DIAGNOSIS — E7841 Elevated Lipoprotein(a): Secondary | ICD-10-CM | POA: Diagnosis not present

## 2022-01-23 DIAGNOSIS — R079 Chest pain, unspecified: Secondary | ICD-10-CM | POA: Diagnosis not present

## 2022-01-23 DIAGNOSIS — R002 Palpitations: Secondary | ICD-10-CM | POA: Diagnosis not present

## 2022-01-23 DIAGNOSIS — I471 Supraventricular tachycardia, unspecified: Secondary | ICD-10-CM | POA: Insufficient documentation

## 2022-01-23 DIAGNOSIS — F419 Anxiety disorder, unspecified: Secondary | ICD-10-CM | POA: Diagnosis not present

## 2022-01-23 DIAGNOSIS — R5383 Other fatigue: Secondary | ICD-10-CM | POA: Diagnosis not present

## 2022-01-23 DIAGNOSIS — Z95820 Peripheral vascular angioplasty status with implants and grafts: Secondary | ICD-10-CM | POA: Diagnosis not present

## 2022-01-23 DIAGNOSIS — I10 Essential (primary) hypertension: Secondary | ICD-10-CM | POA: Diagnosis not present

## 2022-01-23 DIAGNOSIS — Z8249 Family history of ischemic heart disease and other diseases of the circulatory system: Secondary | ICD-10-CM | POA: Diagnosis not present

## 2022-01-23 DIAGNOSIS — I208 Other forms of angina pectoris: Secondary | ICD-10-CM | POA: Diagnosis not present

## 2022-01-23 DIAGNOSIS — Z7952 Long term (current) use of systemic steroids: Secondary | ICD-10-CM | POA: Diagnosis not present

## 2022-01-23 DIAGNOSIS — R531 Weakness: Secondary | ICD-10-CM | POA: Diagnosis not present

## 2022-01-23 DIAGNOSIS — I25110 Atherosclerotic heart disease of native coronary artery with unstable angina pectoris: Secondary | ICD-10-CM | POA: Diagnosis not present

## 2022-01-23 DIAGNOSIS — T82855D Stenosis of coronary artery stent, subsequent encounter: Secondary | ICD-10-CM | POA: Diagnosis not present

## 2022-01-23 DIAGNOSIS — I252 Old myocardial infarction: Secondary | ICD-10-CM | POA: Diagnosis not present

## 2022-01-23 DIAGNOSIS — Z7902 Long term (current) use of antithrombotics/antiplatelets: Secondary | ICD-10-CM | POA: Diagnosis not present

## 2022-01-23 DIAGNOSIS — E785 Hyperlipidemia, unspecified: Secondary | ICD-10-CM | POA: Diagnosis not present

## 2022-01-23 DIAGNOSIS — I2089 Other forms of angina pectoris: Secondary | ICD-10-CM | POA: Insufficient documentation

## 2022-01-23 DIAGNOSIS — F32A Depression, unspecified: Secondary | ICD-10-CM | POA: Diagnosis not present

## 2022-01-23 HISTORY — PX: LEFT HEART CATH AND CORONARY ANGIOGRAPHY: CATH118249

## 2022-01-24 DIAGNOSIS — E7841 Elevated Lipoprotein(a): Secondary | ICD-10-CM | POA: Diagnosis not present

## 2022-01-24 DIAGNOSIS — I25119 Atherosclerotic heart disease of native coronary artery with unspecified angina pectoris: Secondary | ICD-10-CM | POA: Diagnosis not present

## 2022-01-24 DIAGNOSIS — F32A Depression, unspecified: Secondary | ICD-10-CM | POA: Diagnosis not present

## 2022-01-24 DIAGNOSIS — F419 Anxiety disorder, unspecified: Secondary | ICD-10-CM | POA: Diagnosis not present

## 2022-01-24 DIAGNOSIS — I739 Peripheral vascular disease, unspecified: Secondary | ICD-10-CM | POA: Diagnosis not present

## 2022-01-24 DIAGNOSIS — I208 Other forms of angina pectoris: Secondary | ICD-10-CM | POA: Diagnosis not present

## 2022-01-24 DIAGNOSIS — I10 Essential (primary) hypertension: Secondary | ICD-10-CM | POA: Diagnosis not present

## 2022-01-24 DIAGNOSIS — Z95820 Peripheral vascular angioplasty status with implants and grafts: Secondary | ICD-10-CM | POA: Diagnosis not present

## 2022-01-24 DIAGNOSIS — T82855D Stenosis of coronary artery stent, subsequent encounter: Secondary | ICD-10-CM | POA: Diagnosis not present

## 2022-01-24 DIAGNOSIS — I471 Supraventricular tachycardia: Secondary | ICD-10-CM | POA: Diagnosis not present

## 2022-01-24 DIAGNOSIS — Z955 Presence of coronary angioplasty implant and graft: Secondary | ICD-10-CM | POA: Diagnosis not present

## 2022-01-24 DIAGNOSIS — I252 Old myocardial infarction: Secondary | ICD-10-CM | POA: Diagnosis not present

## 2022-02-04 ENCOUNTER — Other Ambulatory Visit: Payer: Self-pay | Admitting: Internal Medicine

## 2022-02-04 ENCOUNTER — Ambulatory Visit
Admission: RE | Admit: 2022-02-04 | Discharge: 2022-02-04 | Disposition: A | Discharge status: Home / Self Care | Payer: Medicare PPO | Source: Ambulatory Visit | Attending: Internal Medicine | Admitting: Internal Medicine

## 2022-02-04 DIAGNOSIS — I729 Aneurysm of unspecified site: Secondary | ICD-10-CM | POA: Diagnosis not present

## 2022-02-04 DIAGNOSIS — S7011XA Contusion of right thigh, initial encounter: Secondary | ICD-10-CM | POA: Diagnosis not present

## 2022-02-04 DIAGNOSIS — Z9889 Other specified postprocedural states: Secondary | ICD-10-CM | POA: Diagnosis not present

## 2022-02-04 DIAGNOSIS — R1032 Left lower quadrant pain: Secondary | ICD-10-CM | POA: Insufficient documentation

## 2022-02-06 ENCOUNTER — Other Ambulatory Visit (INDEPENDENT_AMBULATORY_CARE_PROVIDER_SITE_OTHER): Payer: Medicare PPO

## 2022-02-06 DIAGNOSIS — I10 Essential (primary) hypertension: Secondary | ICD-10-CM

## 2022-02-06 DIAGNOSIS — E7800 Pure hypercholesterolemia, unspecified: Secondary | ICD-10-CM | POA: Diagnosis not present

## 2022-02-06 LAB — BASIC METABOLIC PANEL
BUN: 17 mg/dL (ref 6–23)
CO2: 27 mEq/L (ref 19–32)
Calcium: 9.4 mg/dL (ref 8.4–10.5)
Chloride: 104 mEq/L (ref 96–112)
Creatinine, Ser: 0.81 mg/dL (ref 0.40–1.20)
GFR: 75.35 mL/min (ref >60.00)
Glucose, Bld: 88 mg/dL (ref 70–99)
Potassium: 3.7 mEq/L (ref 3.5–5.1)
Sodium: 139 mEq/L (ref 135–145)

## 2022-02-06 LAB — HEPATIC FUNCTION PANEL
ALT: 23 U/L (ref 0–35)
AST: 21 U/L (ref 0–37)
Albumin: 4.3 g/dL (ref 3.5–5.2)
Alkaline Phosphatase: 74 U/L (ref 39–117)
Bilirubin, Direct: 0.1 mg/dL (ref 0.0–0.3)
Total Bilirubin: 0.6 mg/dL (ref 0.2–1.2)
Total Protein: 7 g/dL (ref 6.0–8.3)

## 2022-02-06 LAB — LIPID PANEL
Cholesterol: 148 mg/dL (ref 0–200)
HDL: 51.6 mg/dL (ref >39.00)
LDL Cholesterol: 68 mg/dL (ref 0–99)
NonHDL: 96.32
Total CHOL/HDL Ratio: 3
Triglycerides: 144 mg/dL (ref 0.0–149.0)
VLDL: 28.8 mg/dL (ref 0.0–40.0)

## 2022-02-06 IMAGING — MG MM DIGITAL DIAGNOSTIC UNILAT*L* W/ TOMO W/ CAD
4 series · 4 of 12 positions shown · non-contrast
Comparison: Previous exam(s).

CLINICAL DATA: 63-year-old patient had a left breast biopsy of a
mass at 10 o'clock position in June 2019, with benign pathology
results showing a benign fibroepithelial lesion. Six-month follow-up
diagnostic mammogram and left breast ultrasound were recommended.

EXAM:
DIGITAL DIAGNOSTIC LEFT MAMMOGRAM WITH CAD AND TOMO
ULTRASOUND LEFT BREAST

[L MLO synth-2D]
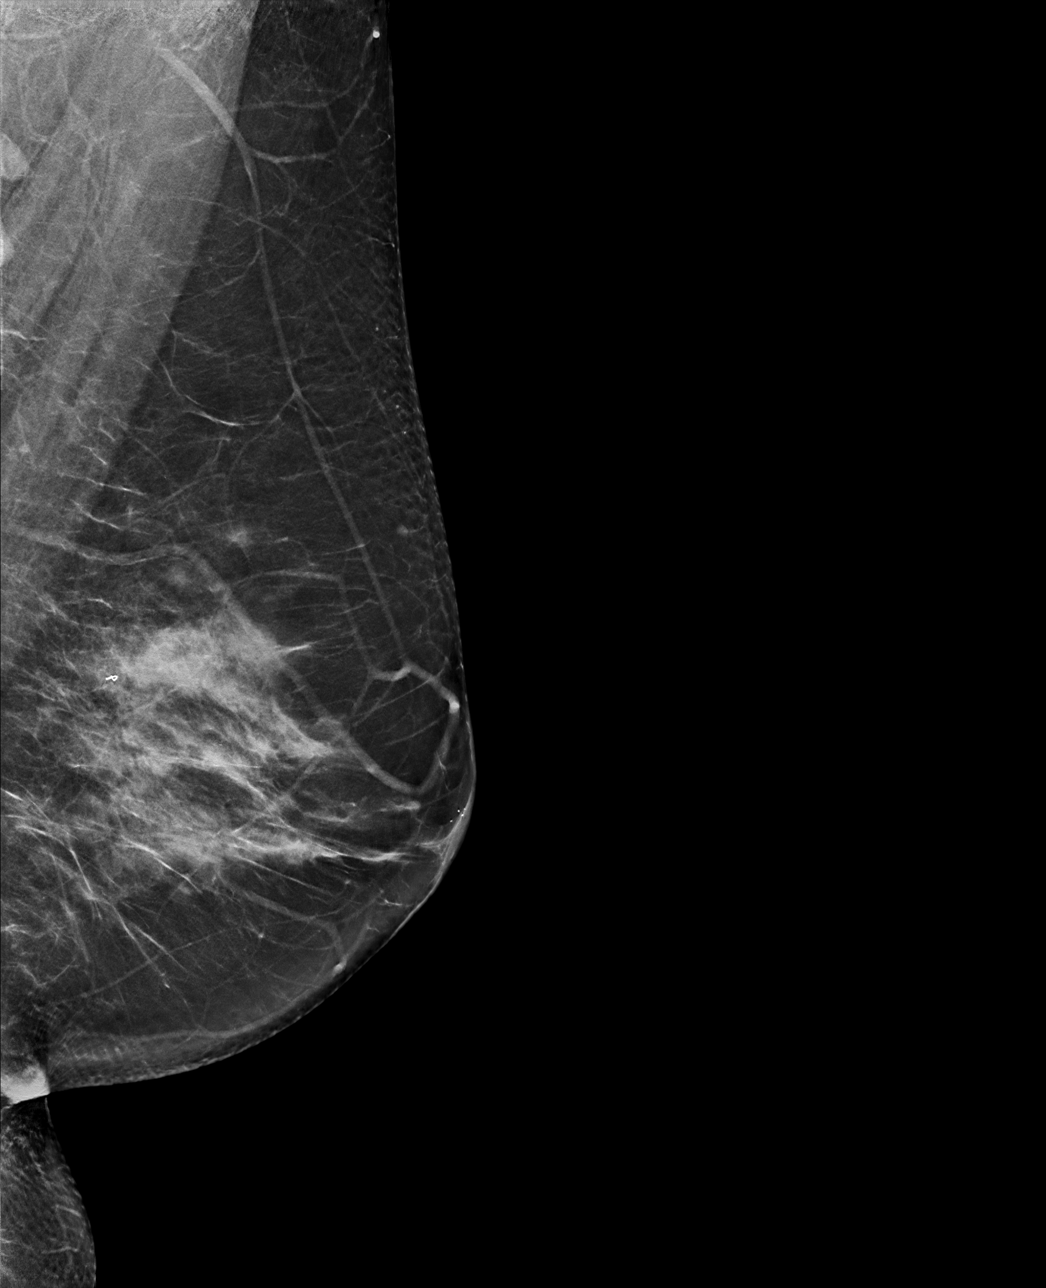

[L CC synth-2D]
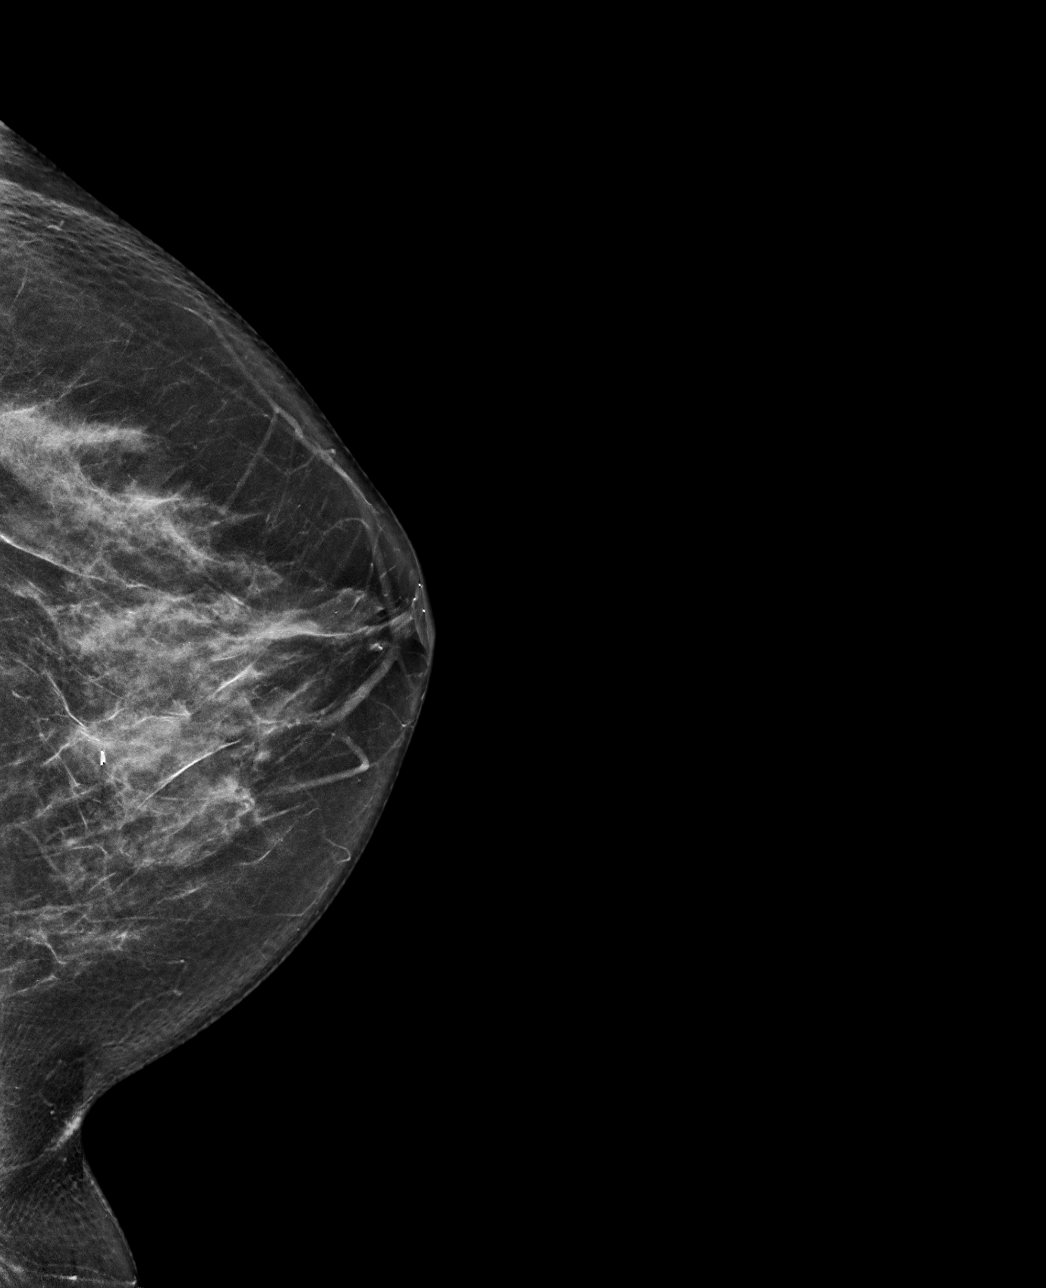

[L MLO tomo · tomo slice 39/76.0]
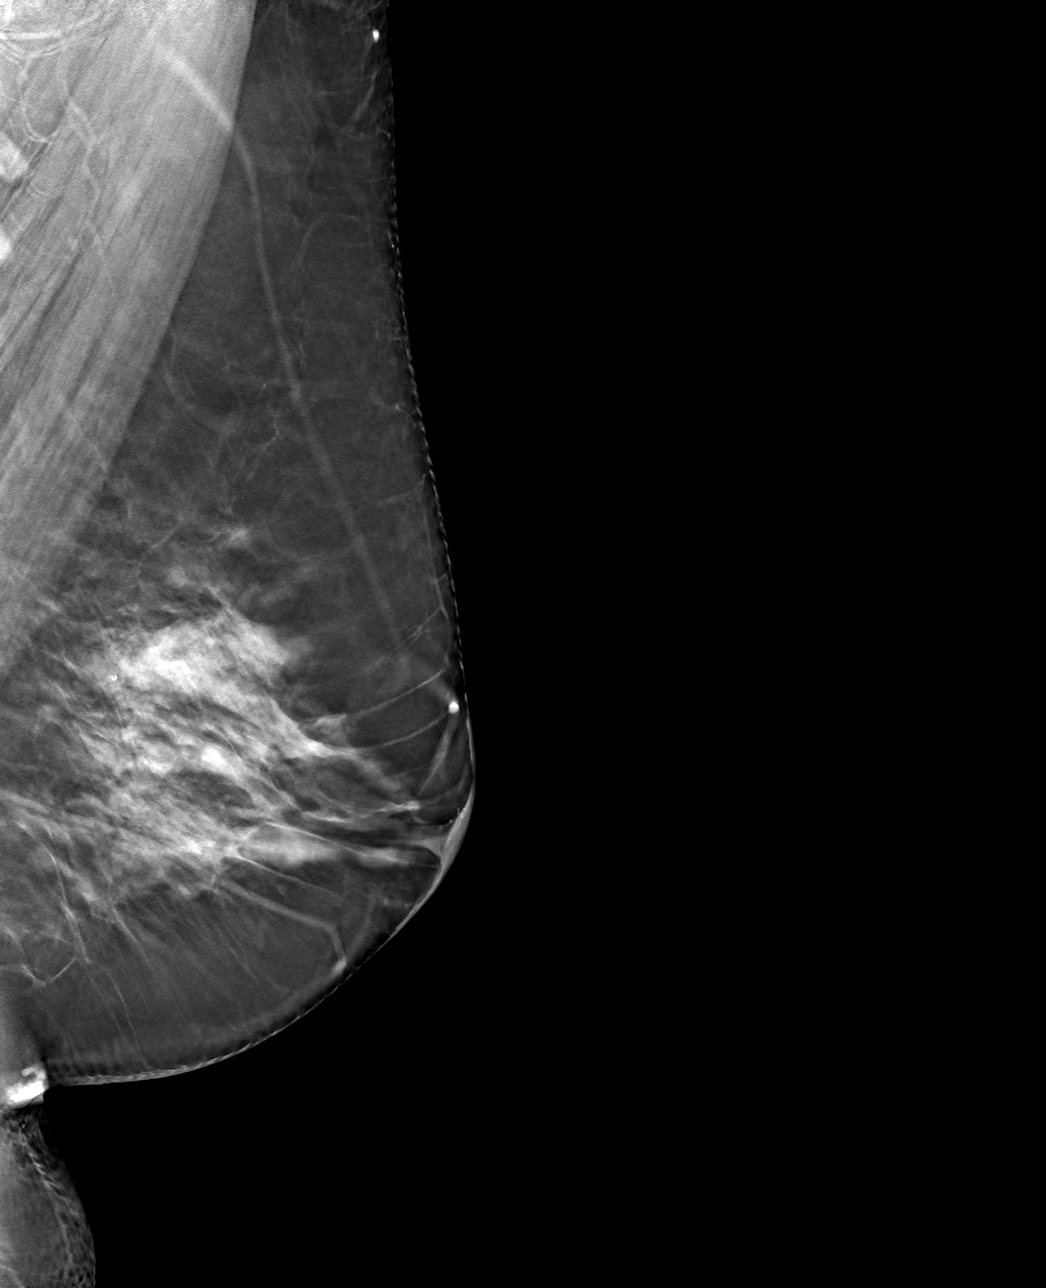

[L CC tomo · tomo slice 35/70.0]
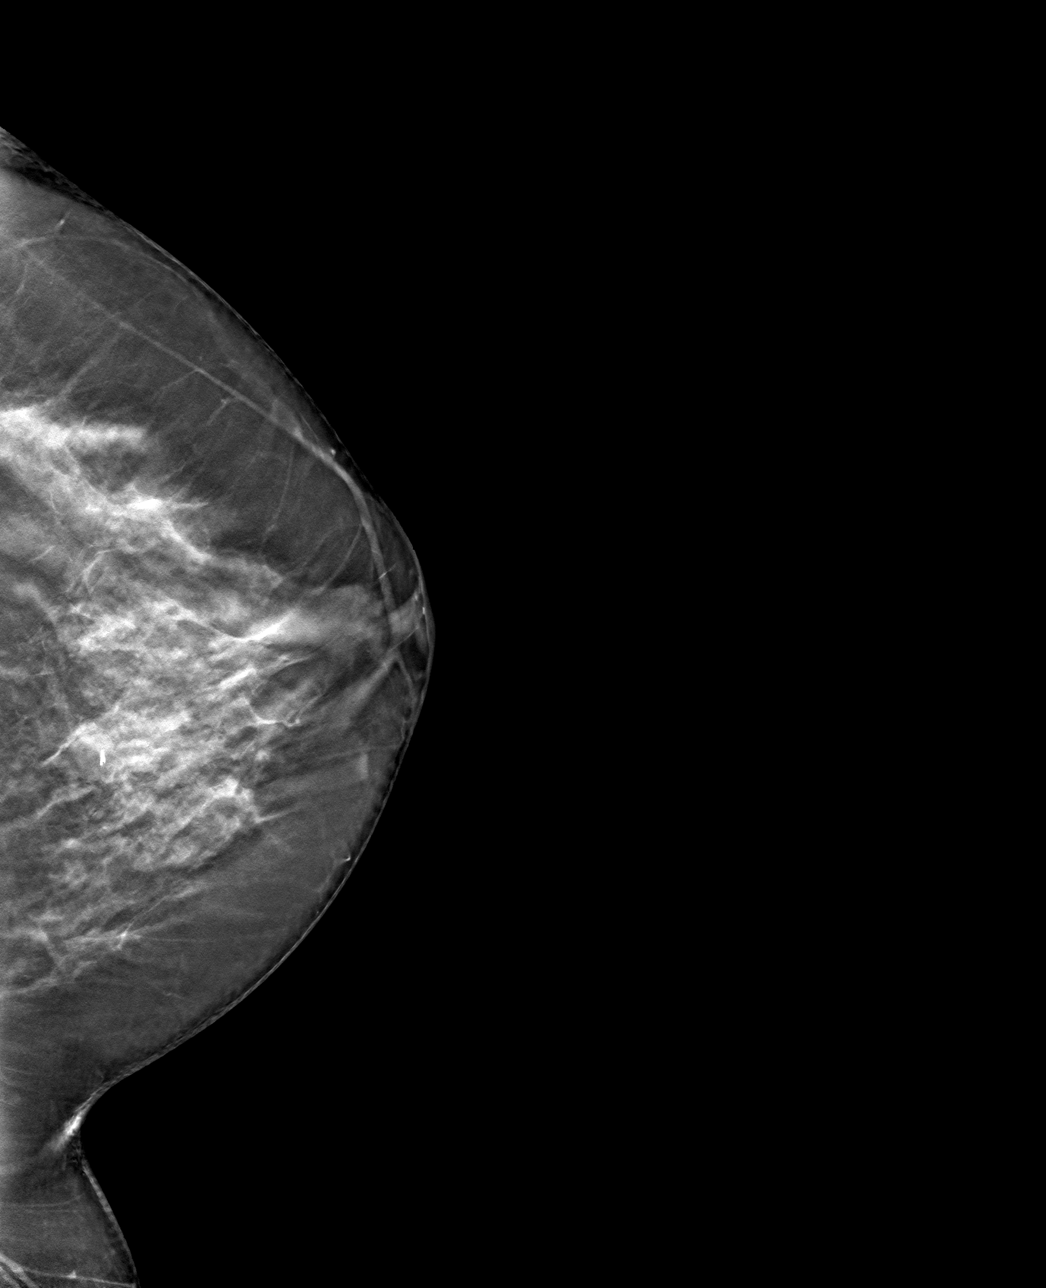

[4 of 12 positions shown; findings below may reference images not displayed]

ACR Breast Density Category c: The breast tissue is heterogeneously
dense, which may obscure small masses.
FINDINGS: Ribbon shaped biopsy clip remains stable position in the upper inner
quadrant of the left breast. The mass is best visualized on
ultrasound. The no suspicious mass, distortion, or suspicious
microcalcification in the left breast.

Mammographic images were processed with CAD.

Targeted ultrasound is performed, showing a parallel circumscribed
homogeneously hypoechoic oval mass at 10 o'clock position 5 cm from
nipple measuring 0.9 x 1.0 x 0.5 cm. Internal biopsy clip artifact
is noted. Previously in June 2019 this mass was measured to be
x 1.1 x 0.5 cm. It remains stable in size and appearance.
IMPRESSION: Stable appearance of a biopsy-proven benign fibroepithelial lesion
left breast 10 o'clock position. No evidence of malignancy in the
left breast.

RECOMMENDATION:
Bilateral screening mammogram is recommended in June 2020.

I have discussed the findings and recommendations with the patient.
If applicable, a reminder letter will be sent to the patient
regarding the next appointment.

BI-RADS CATEGORY  2: Benign.

## 2022-02-10 ENCOUNTER — Ambulatory Visit: Payer: Medicare PPO | Admitting: Internal Medicine

## 2022-02-10 VITALS — BP 122/74 | HR 77 | Temp 97.9°F | Resp 16 | Ht 61.0 in | Wt 167.7 lb

## 2022-02-10 DIAGNOSIS — I700 Atherosclerosis of aorta: Secondary | ICD-10-CM

## 2022-02-10 DIAGNOSIS — I2510 Atherosclerotic heart disease of native coronary artery without angina pectoris: Secondary | ICD-10-CM | POA: Diagnosis not present

## 2022-02-10 DIAGNOSIS — D509 Iron deficiency anemia, unspecified: Secondary | ICD-10-CM

## 2022-02-10 DIAGNOSIS — I10 Essential (primary) hypertension: Secondary | ICD-10-CM | POA: Diagnosis not present

## 2022-02-10 DIAGNOSIS — I25118 Atherosclerotic heart disease of native coronary artery with other forms of angina pectoris: Secondary | ICD-10-CM

## 2022-02-10 DIAGNOSIS — K219 Gastro-esophageal reflux disease without esophagitis: Secondary | ICD-10-CM

## 2022-02-10 DIAGNOSIS — K50919 Crohn's disease, unspecified, with unspecified complications: Secondary | ICD-10-CM

## 2022-02-10 DIAGNOSIS — E7800 Pure hypercholesterolemia, unspecified: Secondary | ICD-10-CM | POA: Diagnosis not present

## 2022-02-10 DIAGNOSIS — F320 Major depressive disorder, single episode, mild: Secondary | ICD-10-CM

## 2022-02-10 DIAGNOSIS — F439 Reaction to severe stress, unspecified: Secondary | ICD-10-CM | POA: Diagnosis not present

## 2022-02-10 DIAGNOSIS — S301XXD Contusion of abdominal wall, subsequent encounter: Secondary | ICD-10-CM

## 2022-02-10 DIAGNOSIS — R531 Weakness: Secondary | ICD-10-CM

## 2022-02-10 DIAGNOSIS — I779 Disorder of arteries and arterioles, unspecified: Secondary | ICD-10-CM | POA: Diagnosis not present

## 2022-02-10 DIAGNOSIS — F32A Depression, unspecified: Secondary | ICD-10-CM

## 2022-02-10 MED ORDER — ROSUVASTATIN CALCIUM 5 MG PO TABS: ORAL_TABLET | ORAL | 1 refills | Status: DC

## 2022-02-10 MED ORDER — INTEGRA 62.5-62.5-40-3 MG PO CAPS: ORAL_CAPSULE | ORAL | 1 refills | Status: DC

## 2022-02-10 MED ORDER — MUPIROCIN 2 % EX OINT: TOPICAL_OINTMENT | CUTANEOUS | 0 refills | Status: DC

## 2022-02-10 NOTE — Progress Notes (Signed)
Patient ID: Morgan Moore, female   DOB: [birth year], 66 y.o.   MRN: [id number] ? ? ?Subjective:  ? ? Patient ID: Morgan Moore, female    DOB: Sep 08, 1956, 66 y.o.   MRN: [id number] ? ?This visit occurred during the SARS-CoV-2 public health emergency.  Safety protocols were in place, including screening questions prior to the visit, additional usage of staff PPE, and extensive cleaning of exam room while observing appropriate contact time as indicated for disinfecting solutions.  ? ?Patient here for scheduled follow up.  ? ?Chief Complaint  ?Patient presents with  ? Hyperlipidemia  ? .  ? ?HPI ?Recent admission - chest pain - s/p PCI and stent in - stent restenosis LAD.  Reevaluated recently - for recurring symptoms. Checked out ok.   Reports persistent fatigue.  No chest pain.  Breathing stable.  No increased cough or congestion.  Eating.  No nausea or vomiting.  Hematoma - groin. Recent ultrasound - no pseudoaneurysm. Is walking.  Trying to exercise more.   ? ? ?Past Medical History:  ?Diagnosis Date  ? Allergy   ? Anemia   ? Anxiety   ? Chicken pox   ? Complication of anesthesia   ? vomitting  ? Coronary artery disease   ? Crohn disease ([place])   ? DVT (deep venous thrombosis) ([place]) 2017  ? Dyspnea   ? on excertion  ? Dysrhythmia 2019  ? palpitations  ? GERD (gastroesophageal reflux disease)   ? Heart disease   ? Heart murmur   ? Heart murmur   ? History of kidney stones   ? Hyperlipidemia   ? Hypertension   ? Inflammatory bowel disease   ? Migraines   ? hormonal, puberty  ? Myocardial infarction [facility])   ? Phlebitis   ? PONV (postoperative nausea and vomiting)   ? ?Past Surgical History:  ?Procedure Laterality Date  ? BREAST BIOPSY Left 07/05/2019  ? FIBROEPITHELIAL lesion/neg  ? BREAST CYST ASPIRATION    ? unsure of side  ? CARPAL TUNNEL RELEASE Right 12/02/2017  ? Procedure: CARPAL TUNNEL RELEASE ENDOSCOPIC;  Surgeon: Corky Mull, MD;  Location: [facility] ORS;  Service: Orthopedics;  Laterality: Right;  ?  CORONARY ANGIOPLASTY WITH STENT PLACEMENT  2017  ? x 2  ? EYE SURGERY Bilateral   ? cataract surgery  ? GANGLION CYST EXCISION Right 12/02/2017  ? Procedure: REMOVAL GANGLION OF WRIST;  Surgeon: Corky Mull, MD;  Location: [facility] ORS;  Service: Orthopedics;  Laterality: Right;  ? heart murmur    ? HYSTEROSCOPY WITH D & C N/A 04/19/2020  ? Procedure: DILATATION AND CURETTAGE /HYSTEROSCOPY, POSSIBLE  POLYPECTOMY;  Surgeon: Benjaman Kindler, MD;  Location: [facility] ORS;  Service: Gynecology;  Laterality: N/A;  ? [place]  ? TUBAL LIGATION  1993  ? tubaligation  1990  ? ?Family History  ?Problem Relation Age of Onset  ? Heart disease Father   ? Heart disease Brother   ? Cancer Maternal Aunt   ? Breast cancer Maternal Aunt   ? Stroke Mother   ? Kidney disease Neg Hx   ? GU problems Neg Hx   ? Kidney cancer Neg Hx   ? ?Social History  ? ?Socioeconomic History  ? Marital status: Single  ?  Spouse name: Not on file  ? Number of children: Not on file  ? Years of education: Not on file  ? Highest education level: Not on file  ?Occupational History  ? Not on  file  ?Tobacco Use  ? Smoking status: Never  ? Smokeless tobacco: Never  ?Vaping Use  ? Vaping Use: Never used  ?Substance and Sexual Activity  ? Alcohol use: No  ?  Alcohol/week: 0.0 standard drinks  ? Drug use: No  ? Sexual activity: Not on file  ?Other Topics Concern  ? Not on file  ?Social History Narrative  ? Had 2 kids. Daughter died in Jan 21, 2012 son living   ? Former Chartered loss adjuster   ? ?Social Determinants of Health  ? ?Financial Resource Strain: [place]   ? Difficulty of Paying Living Expenses: Not hard at all  ?Food Insecurity: No Food Insecurity  ? Worried About Charity fundraiser in the Last Year: Never true  ? Ran Out of Food in the Last Year: Never true  ?Transportation Needs: No Transportation Needs  ? Lack of Transportation (Medical): No  ? Lack of Transportation (Non-Medical): No  ?Physical Activity: Sufficiently Active  ? Days of  Exercise per Week: 5 days  ? Minutes of Exercise per Session: 30 min  ?Stress: No Stress Concern Present  ? Feeling of Stress : Not at all  ?Social Connections: Unknown  ? Frequency of Communication with Friends and Family: More than three times a week  ? Frequency of Social Gatherings with Friends and Family: More than three times a week  ? Attends Religious Services: More than 4 times per year  ? Active Member of Clubs or Organizations: Yes  ? Attends Archivist Meetings: More than 4 times per year  ? Marital Status: Not on file  ? ? ? ?Review of Systems  ?Constitutional:  Positive for fatigue. Negative for appetite change and unexpected weight change.  ?HENT:  Negative for congestion and sinus pressure.   ?Respiratory:  Negative for cough, chest tightness and shortness of breath.   ?Cardiovascular:  Negative for chest pain, palpitations and leg swelling.  ?Gastrointestinal:  Negative for abdominal pain, diarrhea, nausea and vomiting.  ?Genitourinary:  Negative for difficulty urinating and dysuria.  ?Musculoskeletal:  Negative for joint swelling and myalgias.  ?Skin:  Negative for color change and rash.  ?Neurological:  Negative for dizziness, light-headedness and headaches.  ?Psychiatric/Behavioral:  Negative for agitation and dysphoric mood.   ? ?   ?Objective:  ?  ? ?BP 122/74   Pulse 77   Temp 97.9 ?F (36.6 ?C)   Resp 16   Wt 167 lb 11.2 oz (76.1 kg)   SpO2 97%   BMI 31.17 kg/m?  ?Wt Readings from Last 3 Encounters:  ?02/10/22 167 lb 11.2 oz (76.1 kg)  ?10/09/21 167 lb (75.8 kg)  ?08/26/21 167 lb 11.2 oz (76.1 kg)  ? ? ?Physical Exam ?Vitals reviewed.  ?Constitutional:   ?   General: She is not in acute distress. ?   Appearance: Normal appearance.  ?HENT:  ?   Head: Normocephalic and atraumatic.  ?   Right Ear: External ear normal.  ?   Left Ear: External ear normal.  ?Eyes:  ?   General: No scleral icterus.    ?   Right eye: No discharge.     ?   Left eye: No discharge.  ?    Conjunctiva/sclera: Conjunctivae normal.  ?Neck:  ?   Thyroid: No thyromegaly.  ?Cardiovascular:  ?   Rate and Rhythm: Normal rate and regular rhythm.  ?Pulmonary:  ?   Effort: No respiratory distress.  ?   Breath sounds: Normal breath sounds. No wheezing.  ?Abdominal:  ?  General: Bowel sounds are normal.  ?   Palpations: Abdomen is soft.  ?   Tenderness: There is no abdominal tenderness.  ?Musculoskeletal:     ?   General: No swelling or tenderness.  ?   Cervical back: Neck supple. No tenderness.  ?Lymphadenopathy:  ?   Cervical: No cervical adenopathy.  ?Skin: ?   Findings: No erythema or rash.  ?Neurological:  ?   Mental Status: She is alert.  ?Psychiatric:     ?   Mood and Affect: Mood normal.     ?   Behavior: Behavior normal.  ? ? ? ?Outpatient Encounter Medications as of 02/10/2022  ?Medication Sig  ? aspirin EC 81 MG tablet Take 81 mg by mouth daily.  ? Cholecalciferol 25 MCG (1000 UT) tablet Take 1,000 Units by mouth 2 (two) times daily.  ? ciclopirox (PENLAC) 8 % solution Apply topically at bedtime. Apply over nail and surrounding skin. Apply daily over previous coat. After seven (7) days, may remove with alcohol and continue cycle.  ? clopidogrel (PLAVIX) 75 MG tablet Take 75 mg by mouth daily.  ? cyanocobalamin 1000 MCG tablet Take 1,000 mcg by mouth daily at 12 noon.   ? diphenhydrAMINE (BENADRYL) 12.5 MG/5ML elixir Take 12.5 mg by mouth every 6 (six) hours as needed (allergic reaction).  ? EPINEPHrine 0.3 mg/0.3 mL IJ SOAJ injection Inject 0.3 mg into the muscle once.   ? escitalopram (LEXAPRO) 20 MG tablet TAKE 1 TABLET BY MOUTH ONCE DAILY. (Patient taking differently: Take 20 mg by mouth at bedtime.)  ? Fe Fum-FePoly-Vit C-Vit B3 (INTEGRA) 62.5-62.5-40-3 MG CAPS One capsule 3 days per week.  ? Melatonin-Pyridoxine 3-10 MG TABS Take 1 tablet by mouth at bedtime.  ? mesalamine (LIALDA) 1.2 g EC tablet Take 1.2 g by mouth 3 (three) times daily with meals.   ? mupirocin ointment (BACTROBAN) 2 % Apply to  affected area bid  ? nitroGLYCERIN (NITROSTAT) 0.4 MG SL tablet DISSOLVE (1) TABLET UNDER TONGUE AS NEEDED TO RELIEVE CHEST PAIN. MAYREPEAT EVERY 5 MINUTES.  ? ondansetron (ZOFRAN-ODT) 4 MG disintegrating tablet Take 1 tablet (4

## 2022-02-14 ENCOUNTER — Encounter: Payer: Self-pay | Admitting: Internal Medicine

## 2022-02-14 DIAGNOSIS — S301XXA Contusion of abdominal wall, initial encounter: Secondary | ICD-10-CM | POA: Insufficient documentation

## 2022-02-14 DIAGNOSIS — F320 Major depressive disorder, single episode, mild: Secondary | ICD-10-CM | POA: Insufficient documentation

## 2022-02-14 NOTE — Assessment & Plan Note (Signed)
Continue crestor.  Low cholesterol diet and exercise. Follow lipid panel and liver function tests.   ?

## 2022-02-14 NOTE — Assessment & Plan Note (Signed)
Has been on lialda.  Bowels have been stable.  Follow.  ?

## 2022-02-14 NOTE — Assessment & Plan Note (Signed)
Appears to be handling things relatively well.  Continue lexapro.  ?

## 2022-02-14 NOTE — Assessment & Plan Note (Signed)
No upper symptoms reported.  On protonix.  ?

## 2022-02-14 NOTE — Assessment & Plan Note (Signed)
Blood pressure as outlined.  On calan.  Follow pressures.  Follow metabolic panel.  ?

## 2022-02-14 NOTE — Assessment & Plan Note (Signed)
Weakness and fatigue as outlined.  Discussed.  Energy is improving. Continue to gradually advance activity.  Follow.  ?

## 2022-02-14 NOTE — Assessment & Plan Note (Signed)
Continue lexapro.  Has good support.  ?

## 2022-02-14 NOTE — Assessment & Plan Note (Signed)
Continue crestor and aspirin.  Continue risk factor modification.  Seeing cardiology.  ?

## 2022-02-14 NOTE — Assessment & Plan Note (Signed)
Hematoma - groin.  Recent ultrasound - no pseudoaneurysm.  Follow.  No increased pain now.   ?

## 2022-02-14 NOTE — Assessment & Plan Note (Signed)
Follow cbc.  

## 2022-02-14 NOTE — Assessment & Plan Note (Signed)
Continue crestor 

## 2022-04-08 ENCOUNTER — Encounter: Payer: Self-pay | Admitting: Internal Medicine

## 2022-04-08 ENCOUNTER — Ambulatory Visit: Payer: Medicare PPO | Admitting: Internal Medicine

## 2022-04-08 DIAGNOSIS — M25572 Pain in left ankle and joints of left foot: Secondary | ICD-10-CM

## 2022-04-08 DIAGNOSIS — K219 Gastro-esophageal reflux disease without esophagitis: Secondary | ICD-10-CM | POA: Diagnosis not present

## 2022-04-08 DIAGNOSIS — I700 Atherosclerosis of aorta: Secondary | ICD-10-CM

## 2022-04-08 DIAGNOSIS — F439 Reaction to severe stress, unspecified: Secondary | ICD-10-CM

## 2022-04-08 DIAGNOSIS — I10 Essential (primary) hypertension: Secondary | ICD-10-CM

## 2022-04-08 DIAGNOSIS — I872 Venous insufficiency (chronic) (peripheral): Secondary | ICD-10-CM | POA: Insufficient documentation

## 2022-04-08 DIAGNOSIS — E7800 Pure hypercholesterolemia, unspecified: Secondary | ICD-10-CM | POA: Diagnosis not present

## 2022-04-08 DIAGNOSIS — I25118 Atherosclerotic heart disease of native coronary artery with other forms of angina pectoris: Secondary | ICD-10-CM | POA: Diagnosis not present

## 2022-04-08 DIAGNOSIS — D509 Iron deficiency anemia, unspecified: Secondary | ICD-10-CM

## 2022-04-08 DIAGNOSIS — F320 Major depressive disorder, single episode, mild: Secondary | ICD-10-CM

## 2022-04-08 DIAGNOSIS — K50919 Crohn's disease, unspecified, with unspecified complications: Secondary | ICD-10-CM

## 2022-04-08 DIAGNOSIS — I779 Disorder of arteries and arterioles, unspecified: Secondary | ICD-10-CM

## 2022-04-08 NOTE — Progress Notes (Signed)
Patient ID: Morgan Moore, female   DOB: 1955-12-09, 65 y.o.   MRN: [id number]   Subjective:    Patient ID: Morgan Moore, female    DOB: 1956/06/02, 66 y.o.   MRN: [id number]   Patient here for a scheduled follow up.  Marland Kitchen   HPI Here to follow up regarding her cholesterol, CAD and blood pressure.  Energy has improved some.  She is getting more done.  Will still have weak spells.  If she rushes or hurries will notice. No increased chest pain reported.  Blood pressure varying.  Adjusting verapamil.  Eating.  No vomiting.  No abdominal pain.  Has noticed some swelling - left lateral ankle.  Some pain.  Discussed supports - heel.     Past Medical History:  Diagnosis Date   Allergy    Anemia    Anxiety    Chicken pox    Complication of anesthesia    vomitting   Coronary artery disease    Crohn disease ([place])    DVT (deep venous thrombosis) ([place]) 2017   Dyspnea    on excertion   Dysrhythmia 2019   palpitations   GERD (gastroesophageal reflux disease)    Heart disease    Heart murmur    Heart murmur    History of kidney stones    Hyperlipidemia    Hypertension    Inflammatory bowel disease    Migraines    hormonal, puberty   Myocardial infarction ([place])    Phlebitis    PONV (postoperative nausea and vomiting)    Past Surgical History:  Procedure Laterality Date   BREAST BIOPSY Left 07/05/2019   FIBROEPITHELIAL lesion/neg   BREAST CYST ASPIRATION     unsure of side   CARPAL TUNNEL RELEASE Right 12/02/2017   Procedure: CARPAL TUNNEL RELEASE ENDOSCOPIC;  Surgeon: Corky Mull, MD;  Location: [facility] ORS;  Service: Orthopedics;  Laterality: Right;   CORONARY ANGIOPLASTY WITH STENT PLACEMENT  2017   x 2   EYE SURGERY Bilateral    cataract surgery   GANGLION CYST EXCISION Right 12/02/2017   Procedure: REMOVAL GANGLION OF WRIST;  Surgeon: Corky Mull, MD;  Location: [facility];  Service: Orthopedics;  Laterality: Right;   heart murmur     HYSTEROSCOPY WITH D & C N/A  04/19/2020   Procedure: DILATATION AND CURETTAGE /HYSTEROSCOPY, POSSIBLE  POLYPECTOMY;  Surgeon: Benjaman Kindler, MD;  Location: [facility];  Service: Gynecology;  Laterality: N/A;   TONSILLECTOMY AND ADENOIDECTOMY  1962   TUBAL LIGATION  1993   tubaligation  1990   Family History  Problem Relation Age of Onset   Heart disease Father    Heart disease Brother    Cancer Maternal Aunt    Breast cancer Maternal Aunt    Stroke Mother    Kidney disease Neg Hx    GU problems Neg Hx    Kidney cancer Neg Hx    Social History   Socioeconomic History   Marital status: Single    Spouse name: Not on file   Number of children: Not on file   Years of education: Not on file   Highest education level: Not on file  Occupational History   Not on file  Tobacco Use   Smoking status: Never   Smokeless tobacco: Never  Vaping Use   Vaping Use: Never used  Substance and Sexual Activity   Alcohol use: No    Alcohol/week: 0.0 standard drinks   Drug use: No  Sexual activity: Not on file  Other Topics Concern   Not on file  Social History Narrative   Had 2 kids. Daughter died in 01/16/2012 son living    Former Chartered loss adjuster    Social Determinants of Radio broadcast assistant Strain: Low Risk    Difficulty of Paying Living Expenses: Not hard at all  Food Insecurity: No Food Insecurity   Worried About Charity fundraiser in the Last Year: Never true   Arboriculturist in the Last Year: Never true  Transportation Needs: No Transportation Needs   Lack of Transportation (Medical): No   Lack of Transportation (Non-Medical): No  Physical Activity: Sufficiently Active   Days of Exercise per Week: 5 days   Minutes of Exercise per Session: 30 min  Stress: No Stress Concern Present   Feeling of Stress : Not at all  Social Connections: Unknown   Frequency of Communication with Friends and Family: More than three times a week   Frequency of Social Gatherings with Friends and Family: More than three  times a week   Attends Religious Services: More than 4 times per year   Active Member of [employer] or Organizations: Yes   Attends Music therapist: More than 4 times per year   Marital Status: Not on file     Review of Systems  Constitutional:  Positive for fatigue. Negative for appetite change and unexpected weight change.  HENT:  Negative for congestion and sinus pressure.   Respiratory:  Negative for cough and chest tightness.        Breathing stable.   Cardiovascular:  Negative for palpitations.       No increased chest pain.    Gastrointestinal:  Negative for abdominal pain, diarrhea, nausea and vomiting.  Genitourinary:  Negative for difficulty urinating and dysuria.  Musculoskeletal:  Negative for myalgias.       Ankle pain/swelling as outlined.   Skin:  Negative for color change and rash.  Neurological:  Negative for dizziness, light-headedness and headaches.  Psychiatric/Behavioral:  Negative for agitation and dysphoric mood.       Objective:     BP 138/72 (BP Location: Left Arm, Patient Position: Sitting, Cuff Size: Small)   Pulse 65   Temp 97.8 F (36.6 C) (Temporal)   Resp 15   Ht 5' 1"  (1.549 m)   Wt 166 lb 6.4 oz (75.5 kg)   SpO2 95%   BMI 31.44 kg/m  Wt Readings from Last 3 Encounters:  04/10/22 168 lb (76.2 kg)  04/09/22 168 lb (76.2 kg)  04/08/22 166 lb 6.4 oz (75.5 kg)    Physical Exam Vitals reviewed.  Constitutional:      General: She is not in acute distress.    Appearance: Normal appearance.  HENT:     Head: Normocephalic and atraumatic.     Right Ear: External ear normal.     Left Ear: External ear normal.  Eyes:     General: No scleral icterus.       Right eye: No discharge.        Left eye: No discharge.     Conjunctiva/sclera: Conjunctivae normal.  Neck:     Thyroid: No thyromegaly.  Cardiovascular:     Rate and Rhythm: Normal rate and regular rhythm.  Pulmonary:     Effort: No respiratory distress.     Breath sounds:  Normal breath sounds. No wheezing.  Abdominal:     General: Bowel sounds are normal.  Palpations: Abdomen is soft.     Tenderness: There is no abdominal tenderness.  Musculoskeletal:     Cervical back: Neck supple. No tenderness.     Comments: Left ankle - minimal soft tissue swelling.  Pain to palpation - lateral heel/ankle.   Lymphadenopathy:     Cervical: No cervical adenopathy.  Skin:    Findings: No erythema or rash.  Neurological:     Mental Status: She is alert.  Psychiatric:        Mood and Affect: Mood normal.        Behavior: Behavior normal.     Outpatient Encounter Medications as of 04/08/2022  Medication Sig   aspirin EC 81 MG tablet Take 81 mg by mouth daily.   Cholecalciferol 25 MCG (1000 UT) tablet Take 1,000 Units by mouth 2 (two) times daily.   clopidogrel (PLAVIX) 75 MG tablet Take 75 mg by mouth daily.   cyanocobalamin 1000 MCG tablet Take 1,000 mcg by mouth daily at 12 noon.    diphenhydrAMINE (BENADRYL) 12.5 MG/5ML elixir Take 12.5 mg by mouth every 6 (six) hours as needed (allergic reaction).   EPINEPHrine 0.3 mg/0.3 mL IJ SOAJ injection Inject 0.3 mg into the muscle once.    escitalopram (LEXAPRO) 20 MG tablet TAKE 1 TABLET BY MOUTH ONCE DAILY. (Patient taking differently: Take 20 mg by mouth at bedtime.)   Fe Fum-FePoly-Vit C-Vit B3 (INTEGRA) 62.5-62.5-40-3 MG CAPS One capsule 3 days per week.   Melatonin-Pyridoxine 3-10 MG TABS Take 1 tablet by mouth at bedtime.   mesalamine (LIALDA) 1.2 g EC tablet Take 1.2 g by mouth 3 (three) times daily with meals.    mupirocin ointment (BACTROBAN) 2 % Apply to affected area bid   nitroGLYCERIN (NITROSTAT) 0.4 MG SL tablet DISSOLVE (1) TABLET UNDER TONGUE AS NEEDED TO RELIEVE CHEST PAIN. MAYREPEAT EVERY 5 MINUTES.   ondansetron (ZOFRAN-ODT) 4 MG disintegrating tablet Take 1 tablet (4 mg total) by mouth daily as needed for nausea.   prednisoLONE acetate (PRED FORTE) 1 % ophthalmic suspension Place 1 drop into both  eyes 2 (two) times daily as needed (for inflammation).   REPATHA 140 MG/ML SOSY Inject into the skin.   verapamil (CALAN-SR) 120 MG CR tablet Take 120 mg by mouth every evening.    [DISCONTINUED] ciclopirox (PENLAC) 8 % solution Apply topically at bedtime. Apply over nail and surrounding skin. Apply daily over previous coat. After seven (7) days, may remove with alcohol and continue cycle. (Patient not taking: Reported on 04/09/2022)   [DISCONTINUED] rosuvastatin (CRESTOR) 5 MG tablet One tablet 2 days per week.   [DISCONTINUED] pantoprazole (PROTONIX) 20 MG tablet Take 20 mg by mouth daily.  (Patient not taking: Reported on 04/08/2022)   No facility-administered encounter medications on file as of 04/08/2022.     Lab Results  Component Value Date   WBC 8.4 10/07/2021   HGB 13.5 10/07/2021   HCT 39.7 10/07/2021   PLT 315.0 10/07/2021   GLUCOSE 88 02/06/2022   CHOL 148 02/06/2022   TRIG 144.0 02/06/2022   HDL 51.60 02/06/2022   LDLCALC 68 02/06/2022   ALT 23 02/06/2022   AST 21 02/06/2022   NA 139 02/06/2022   K 3.7 02/06/2022   CL 104 02/06/2022   CREATININE 0.81 02/06/2022   BUN 17 02/06/2022   CO2 27 02/06/2022   TSH 3.19 10/07/2021   INR 1.0 12/23/2017    [place] Lower Ext Art Left Ltd  Result Date: 02/04/2022 CLINICAL DATA:  Recent heart catheterization.  Evaluate for hematoma or pseudoaneurysm. Left groin pain. EXAM: LEFT LOWER EXTREMITY LIMITED SOFT TISSUE ULTRASOUND TECHNIQUE: Ultrasound examination of the lower extremity soft tissues was performed in the area of clinical concern. COMPARISON:  CT abdomen and pelvis 03/28/2017 FINDINGS: Normal appearance of the left common femoral vein and left saphenofemoral junction. Left common femoral artery is patent. There is no evidence for a pseudoaneurysm. No large hematoma formation. IMPRESSION: Negative for pseudoaneurysm in the left groin.  No large hematoma. Electronically Signed   By: Markus Daft M.D.   On: 02/04/2022 16:44        Assessment & Plan:   Problem List Items Addressed This Visit     Anemia    Follow cbc.        Ankle pain    Heel/ankle - pain soft tissue swelling. Supports.  Follow.  Discussed podiatry referral.  Will notify me if desires referral.         Aortic atherosclerosis ([place])    Continue crestor.         Carotid artery disease ([place])    Continue crestor and aspirin.  Continue risk factor modification.  Seeing cardiology.  Has appt with Dr Delana Meyer tomorrow.        Coronary artery disease of native artery of native heart with stable angina pectoris ([place])    Continue crestor and aspirin.  Continue risk factor modification.  Seeing cardiology.        Crohn disease ([place])    Has been on lialda.  Bowels have been stable.  Follow.        Essential hypertension    Blood pressure varying.  Adjusting verapamil per pt.  Wants to follow.  Spot check pressures.  Follow metabolic panel.        Relevant Orders   Basic metabolic panel   GERD (gastroesophageal reflux disease)    No upper symptoms reported.  On protonix.        Hypercholesterolemia    Continue crestor.  Low cholesterol diet and exercise. Follow lipid panel and liver function tests.         Relevant Orders   TSH   Lipid panel   Hepatic function panel   Major depressive disorder, single episode, mild (HCC)    Overall appears to be doing well. On lexapro.  Follow.        Stress    Appears to be handling things relatively well.  Continue lexapro.          Einar Pheasant, MD

## 2022-04-08 NOTE — Progress Notes (Signed)
MRN : [id number]  Morgan Moore is a 66 y.o. (1955-12-16) female who presents with chief complaint of check carotid arteries.  History of Present Illness:   The patient is seen for follow up evaluation of carotid stenosis. The carotid stenosis followed by ultrasound.    The patient denies amaurosis fugax. There is no recent history of TIA symptoms or focal motor deficits. There is no prior documented CVA.   The patient is taking enteric-coated aspirin 81 mg daily.   There is no history of migraine headaches. There is no history of seizures.   She is also being followed for painful varicose veins. The patient returns for followup evaluation 6 months after the initial visit. The patient continues to have pain in the lower extremities with dependency. The pain is lessened with elevation. Graduated compression stockings, Class I (20-30 mmHg), have been worn but the stockings do not eliminate the leg pain. Over-the-counter analgesics do not improve the symptoms. The degree of discomfort continues to interfere with daily activities. The patient notes the pain in the legs is causing problems with daily exercise, at the workplace and even with household activities and maintenance such as standing in the kitchen preparing meals and doing dishes.    The patient has a history of coronary artery disease, no recent episodes of angina or shortness of breath. The patient denies PAD or claudication symptoms. There is a history of hyperlipidemia which is being treated with a statin.     Carotid Duplex done today shows KCMK=3-49% and LICA=1-39%.  No change compared to last study.     No outpatient medications have been marked as taking for the 04/09/22 encounter (Appointment) with Delana Meyer, Dolores Lory, MD.    Past Medical History:  Diagnosis Date   Allergy    Anemia    Anxiety    Chicken pox    Complication of anesthesia    vomitting   Coronary artery disease    Crohn disease ([place])     DVT (deep venous thrombosis) ([place]) 2017   Dyspnea    on excertion   Dysrhythmia 2019   palpitations   GERD (gastroesophageal reflux disease)    Heart disease    Heart murmur    Heart murmur    History of kidney stones    Hyperlipidemia    Hypertension    Inflammatory bowel disease    Migraines    hormonal, puberty   Myocardial infarction ([place])    Phlebitis    PONV (postoperative nausea and vomiting)     Past Surgical History:  Procedure Laterality Date   BREAST BIOPSY Left 07/05/2019   FIBROEPITHELIAL lesion/neg   BREAST CYST ASPIRATION     unsure of side   CARPAL TUNNEL RELEASE Right 12/02/2017   Procedure: CARPAL TUNNEL RELEASE ENDOSCOPIC;  Surgeon: Corky Mull, MD;  Location: [facility];  Service: Orthopedics;  Laterality: Right;   CORONARY ANGIOPLASTY WITH STENT PLACEMENT  2017   x 2   EYE SURGERY Bilateral    cataract surgery   GANGLION CYST EXCISION Right 12/02/2017   Procedure: REMOVAL GANGLION OF WRIST;  Surgeon: Corky Mull, MD;  Location: [facility];  Service: Orthopedics;  Laterality: Right;   heart murmur     HYSTEROSCOPY WITH D & C N/A 04/19/2020   Procedure: DILATATION AND CURETTAGE /HYSTEROSCOPY, POSSIBLE  POLYPECTOMY;  Surgeon: Benjaman Kindler, MD;  Location: [facility];  Service: Gynecology;  Laterality: N/A;   [place]  TUBAL LIGATION  1993   tubaligation  1990    Social History Social History   Tobacco Use   Smoking status: Never   Smokeless tobacco: Never  Vaping Use   Vaping Use: Never used  Substance Use Topics   Alcohol use: No    Alcohol/week: 0.0 standard drinks   Drug use: No    Family History Family History  Problem Relation Age of Onset   Heart disease Father    Heart disease Brother    Cancer Maternal Aunt    Breast cancer Maternal Aunt    Stroke Mother    Kidney disease Neg Hx    GU problems Neg Hx    Kidney cancer Neg Hx     Allergies  Allergen Reactions   Ciprofloxacin Anaphylaxis    Keflex [Cephalexin] Anaphylaxis   Lisinopril Anaphylaxis   Meclizine Anaphylaxis   Monurol [Fosfomycin Tromethamine  (Obsolete)] Rash    Splotchy rash all over body. Breathing became difficult/tightened. Took benadryl immediately so she did not swell    Sulfa Antibiotics Anaphylaxis   Tessalon [Benzonatate] Anaphylaxis   Influenza Virus Vaccine Other (See Comments)    History of Epstein Barr   Codeine Nausea And Vomiting     REVIEW OF SYSTEMS (Negative unless checked)  Constitutional: [] Weight loss  [] Fever  [] Chills Cardiac: [] Chest pain   [] Chest pressure   [] Palpitations   [] Shortness of breath when laying flat   [] Shortness of breath with exertion. Vascular:  [x] Pain in legs with walking   [] Pain in legs at rest  [] History of DVT   [] Phlebitis   [] Swelling in legs   [x] Varicose veins   [] Non-healing ulcers Pulmonary:   [] Uses home oxygen   [] Productive cough   [] Hemoptysis   [] Wheeze  [] COPD   [] Asthma Neurologic:  [] Dizziness   [] Seizures   [] History of stroke   [] History of TIA  [] Aphasia   [] Vissual changes   [] Weakness or numbness in arm   [] Weakness or numbness in leg Musculoskeletal:   [] Joint swelling   [x] Joint pain   [] Low back pain Hematologic:  [] Easy bruising  [] Easy bleeding   [] Hypercoagulable state   [] Anemic Gastrointestinal:  [] Diarrhea   [] Vomiting  [x] Gastroesophageal reflux/heartburn   [] Difficulty swallowing. Genitourinary:  [] Chronic kidney disease   [] Difficult urination  [] Frequent urination   [] Blood in urine Skin:  [] Rashes   [] Ulcers  Psychological:  [] History of anxiety   []  History of major depression.  Physical Examination  There were no vitals filed for this visit. There is no height or weight on file to calculate BMI. Gen: WD/WN, NAD Head: [place]/AT, No temporalis wasting.  Ear/Nose/Throat: Hearing grossly intact, nares w/o erythema or drainage Eyes: PER, EOMI, sclera nonicteric.  Neck: Supple, no masses.  No bruit or JVD.  Pulmonary:  Good air  movement, no audible wheezing, no use of accessory muscles.  Cardiac: RRR, normal S1, S2, no Murmurs. Vascular:  carotid bruit noted; mild venous skin changes bilaterally with  6-8 mm varicosities bilaterally Vessel Right Left  Radial Palpable Palpable  Carotid  Palpable  Palpable  Gastrointestinal: soft, non-distended. No guarding/no peritoneal signs.  Musculoskeletal: M/S 5/5 throughout.  No visible deformity.  Neurologic: CN 2-12 intact. Pain and light touch intact in extremities.  Symmetrical.  Speech is fluent. Motor exam as listed above. Psychiatric: Judgment intact, Mood & affect appropriate for pt's clinical situation. Dermatologic: No rashes or ulcers noted.  No changes consistent with cellulitis.   CBC Lab Results  Component Value Date  WBC 8.4 10/07/2021   HGB 13.5 10/07/2021   HCT 39.7 10/07/2021   MCV 94.2 10/07/2021   PLT 315.0 10/07/2021    BMET    Component Value Date/Time   NA 139 02/06/2022 0918   NA 135 02/25/2015 0950   K 3.7 02/06/2022 0918   K 3.8 02/25/2015 0950   CL 104 02/06/2022 0918   CL 97 (L) 02/25/2015 0950   CO2 27 02/06/2022 0918   CO2 29 02/25/2015 0950   GLUCOSE 88 02/06/2022 0918   GLUCOSE 100 (H) 02/25/2015 0950   BUN 17 02/06/2022 0918   BUN 12 02/25/2015 0950   CREATININE 0.81 02/06/2022 0918   CREATININE 0.83 02/25/2015 0950   CALCIUM 9.4 02/06/2022 0918   CALCIUM 8.9 02/25/2015 0950   GFRNONAA >60 04/01/2021 2228   GFRNONAA >60 02/25/2015 0950   GFRAA >60 11/19/2017 1217   GFRAA >60 02/25/2015 0950   CrCl cannot be calculated (Patient's most recent lab result is older than the maximum 21 days allowed.).  COAG Lab Results  Component Value Date   INR 1.0 12/23/2017   INR 1.1 09/21/2014    Radiology No results found.   Assessment/Plan 1. Carotid artery disease, unspecified laterality, unspecified type ([place]) Recommend:  Given the patient's asymptomatic subcritical stenosis no further invasive testing or surgery at  this time.  Duplex ultrasound shows 1-39% stenosis bilaterally.  Continue antiplatelet therapy as prescribed Continue management of CAD, HTN and Hyperlipidemia Healthy heart diet,  encouraged exercise at least 4 times per week Follow up in 12 months with duplex ultrasound and physical exam   - VAS US CAROTID; Future  2. Chronic venous insufficiency No surgery or intervention at this point in time.    I have discussed with the patient venous insufficiency and why it  causes symptoms. I have discussed with the patient the chronic skin changes that accompany venous insufficiency and the long term sequela such as infection and ulceration.  Patient will begin wearing graduated compression stockings or compression wraps on a daily basis.  The patient will put the compression on first thing in the morning and removing them in the evening. The patient is instructed specifically not to sleep in the compression.    In addition, behavioral modification including several periods of elevation of the lower extremities during the day will be continued. I have demonstrated that proper elevation is a position with the ankles at heart level.  The patient is instructed to begin routine exercise, especially walking on a daily basis  The patient will be assessed for a Lymph Pump depending on the effectiveness of conservative therapy and the control of the associated lymphedema.   3. Essential hypertension Continue antihypertensive medications as already ordered, these medications have been reviewed and there are no changes at this time.   4. Coronary artery disease of native artery of native heart with stable angina pectoris (HCC) Continue cardiac and antihypertensive medications as already ordered and reviewed, no changes at this time.  Continue statin as ordered and reviewed, no changes at this time  Nitrates PRN for chest pain   5. Hypercholesterolemia Continue statin as ordered and reviewed, no  changes at this time   6. Varicose veins with pain  Recommend:  The patient has large symptomatic varicose veins that are painful and associated with swelling.  I have had a long discussion with the patient regarding  varicose veins and why they cause symptoms.  Patient will begin wearing graduated compression stockings class 1 on a  daily basis, beginning first thing in the morning and removing them in the evening. The patient is instructed specifically not to sleep in the stockings.    The patient  will also begin using over-the-counter analgesics such as Motrin 600 mg po TID to help control the symptoms.    In addition, behavioral modification including elevation during the day will be initiated.    An ultrasound of the venous system will be obtained.   Further plans will be based on the ultrasound results and whether conservative therapies are successful at eliminating the pain and swelling.   - VAS [place] LOWER EXTREMITY VENOUS REFLUX; Future    Hortencia Pilar, MD  04/08/2022 7:30 AM

## 2022-04-09 ENCOUNTER — Ambulatory Visit (INDEPENDENT_AMBULATORY_CARE_PROVIDER_SITE_OTHER): Payer: Medicare PPO

## 2022-04-09 ENCOUNTER — Ambulatory Visit (INDEPENDENT_AMBULATORY_CARE_PROVIDER_SITE_OTHER): Payer: Medicare PPO | Admitting: Vascular Surgery

## 2022-04-09 ENCOUNTER — Encounter (INDEPENDENT_AMBULATORY_CARE_PROVIDER_SITE_OTHER): Payer: Self-pay | Admitting: Vascular Surgery

## 2022-04-09 VITALS — BP 147/75 | HR 57 | Resp 17 | Ht 61.0 in | Wt 168.0 lb

## 2022-04-09 DIAGNOSIS — I25118 Atherosclerotic heart disease of native coronary artery with other forms of angina pectoris: Secondary | ICD-10-CM

## 2022-04-09 DIAGNOSIS — I779 Disorder of arteries and arterioles, unspecified: Secondary | ICD-10-CM | POA: Diagnosis not present

## 2022-04-09 DIAGNOSIS — I10 Essential (primary) hypertension: Secondary | ICD-10-CM

## 2022-04-09 DIAGNOSIS — I6529 Occlusion and stenosis of unspecified carotid artery: Secondary | ICD-10-CM | POA: Diagnosis not present

## 2022-04-09 DIAGNOSIS — E7800 Pure hypercholesterolemia, unspecified: Secondary | ICD-10-CM | POA: Diagnosis not present

## 2022-04-09 DIAGNOSIS — I83819 Varicose veins of unspecified lower extremities with pain: Secondary | ICD-10-CM | POA: Diagnosis not present

## 2022-04-09 DIAGNOSIS — I872 Venous insufficiency (chronic) (peripheral): Secondary | ICD-10-CM | POA: Diagnosis not present

## 2022-04-10 ENCOUNTER — Ambulatory Visit (INDEPENDENT_AMBULATORY_CARE_PROVIDER_SITE_OTHER): Payer: Medicare PPO

## 2022-04-10 ENCOUNTER — Other Ambulatory Visit: Payer: Self-pay | Admitting: Internal Medicine

## 2022-04-10 VITALS — Ht 61.0 in | Wt 168.0 lb

## 2022-04-10 DIAGNOSIS — Z0000 Encounter for general adult medical examination without abnormal findings: Secondary | ICD-10-CM | POA: Diagnosis not present

## 2022-04-10 NOTE — Progress Notes (Signed)
Subjective:   Morgan Moore is a 66 y.o. female who presents for Medicare Annual (Subsequent) preventive examination.  Review of Systems    No ROS.  Medicare Wellness Virtual Visit.  Visual/audio telehealth visit, UTA vital signs.   See social history for additional risk factors.   Cardiac Risk Factors include: advanced age (>72mn, >>25women)     Objective:    Today's Vitals   04/10/22 1037  Weight: 168 lb (76.2 kg)  Height: 5' 1"  (1.549 m)   Body mass index is 31.74 kg/m.     04/10/2022   10:44 AM 06/02/2021    2:21 PM 04/08/2021    8:36 AM 04/01/2021   10:22 PM 04/19/2020    6:24 AM 04/09/2020    9:09 AM 11/19/2017   11:59 AM  Advanced Directives  Does Patient Have a Medical Advance Directive? Yes Yes Yes No No Yes No  Type of AParamedicof ASylvesterLiving will Healthcare Power of [place]  Does patient want to make changes to medical advance directive? No - Patient declined No - Patient declined No - Patient declined   No - Patient declined   Copy of [place] Chart? No - copy requested  No - copy requested   No - copy requested   Would patient like information on creating a medical advance directive?     No - Patient declined No - Patient declined No - Patient declined   Current Medications (verified) Outpatient Encounter Medications as of 04/10/2022  Medication Sig   aspirin EC 81 MG tablet Take 81 mg by mouth daily.   Cholecalciferol 25 MCG (1000 UT) tablet Take 1,000 Units by mouth 2 (two) times daily.   ciclopirox (PENLAC) 8 % solution Apply topically at bedtime. Apply over nail and surrounding skin. Apply daily over previous coat. After seven (7) days, may remove with alcohol and continue cycle. (Patient not taking: Reported on 04/09/2022)   clopidogrel (PLAVIX) 75 MG tablet Take 75 mg by mouth daily.   cyanocobalamin 1000 MCG tablet Take 1,000 mcg by mouth  daily at 12 noon.    diphenhydrAMINE (BENADRYL) 12.5 MG/5ML elixir Take 12.5 mg by mouth every 6 (six) hours as needed (allergic reaction).   EPINEPHrine 0.3 mg/0.3 mL IJ SOAJ injection Inject 0.3 mg into the muscle once.    escitalopram (LEXAPRO) 20 MG tablet TAKE 1 TABLET BY MOUTH ONCE DAILY. (Patient taking differently: Take 20 mg by mouth at bedtime.)   Fe Fum-FePoly-Vit C-Vit B3 (INTEGRA) 62.5-62.5-40-3 MG CAPS One capsule 3 days per week.   Melatonin-Pyridoxine 3-10 MG TABS Take 1 tablet by mouth at bedtime.   mesalamine (LIALDA) 1.2 g EC tablet Take 1.2 g by mouth 3 (three) times daily with meals.    mupirocin ointment (BACTROBAN) 2 % Apply to affected area bid   nitroGLYCERIN (NITROSTAT) 0.4 MG SL tablet DISSOLVE (1) TABLET UNDER TONGUE AS NEEDED TO RELIEVE CHEST PAIN. MAYREPEAT EVERY 5 MINUTES.   ondansetron (ZOFRAN-ODT) 4 MG disintegrating tablet Take 1 tablet (4 mg total) by mouth daily as needed for nausea.   pantoprazole (PROTONIX) 20 MG tablet Take 20 mg by mouth daily.  (Patient not taking: Reported on 04/08/2022)   prednisoLONE acetate (PRED FORTE) 1 % ophthalmic suspension Place 1 drop into both eyes 2 (two) times daily as needed (for inflammation).   REPATHA 140 MG/ML SOSY Inject into the skin.   rosuvastatin (CRESTOR)  5 MG tablet One tablet 2 days per week.   verapamil (CALAN-SR) 120 MG CR tablet Take 120 mg by mouth every evening.    No facility-administered encounter medications on file as of 04/10/2022.    Allergies (verified) Ciprofloxacin, Keflex [cephalexin], Lisinopril, Meclizine, Monurol [fosfomycin tromethamine  (obsolete)], Sulfa antibiotics, Tessalon [benzonatate], Influenza virus vaccine, and Codeine   History: Past Medical History:  Diagnosis Date   Allergy    Anemia    Anxiety    Chicken pox    Complication of anesthesia    vomitting   Coronary artery disease    Crohn disease ([place])    DVT (deep venous thrombosis) ([place]) 01-26-2016   Dyspnea    on excertion    Dysrhythmia 01-25-2018   palpitations   GERD (gastroesophageal reflux disease)    Heart disease    Heart murmur    Heart murmur    History of kidney stones    Hyperlipidemia    Hypertension    Inflammatory bowel disease    Migraines    hormonal, puberty   Myocardial infarction ([place])    Phlebitis    PONV (postoperative nausea and vomiting)    Past Surgical History:  Procedure Laterality Date   BREAST BIOPSY Left 07/05/2019   FIBROEPITHELIAL lesion/neg   BREAST CYST ASPIRATION     unsure of side   CARPAL TUNNEL RELEASE Right 12/02/2017   Procedure: CARPAL TUNNEL RELEASE ENDOSCOPIC;  Surgeon: Corky Mull, MD;  Location: [facility] ORS;  Service: Orthopedics;  Laterality: Right;   CORONARY ANGIOPLASTY WITH STENT PLACEMENT  01-26-2016   x 2   EYE SURGERY Bilateral    cataract surgery   GANGLION CYST EXCISION Right 12/02/2017   Procedure: REMOVAL GANGLION OF WRIST;  Surgeon: Corky Mull, MD;  Location: [facility] ORS;  Service: Orthopedics;  Laterality: Right;   heart murmur     HYSTEROSCOPY WITH D & C N/A 04/19/2020   Procedure: DILATATION AND CURETTAGE /HYSTEROSCOPY, POSSIBLE  POLYPECTOMY;  Surgeon: Benjaman Kindler, MD;  Location: [facility] ORS;  Service: Gynecology;  Laterality: N/A;   TONSILLECTOMY AND ADENOIDECTOMY  1962   TUBAL LIGATION  1993   tubaligation  1990   Family History  Problem Relation Age of Onset   Heart disease Father    Heart disease Brother    Cancer Maternal Aunt    Breast cancer Maternal Aunt    Stroke Mother    Kidney disease Neg Hx    GU problems Neg Hx    Kidney cancer Neg Hx    Social History   Socioeconomic History   Marital status: Single    Spouse name: Not on file   Number of children: Not on file   Years of education: Not on file   Highest education level: Not on file  Occupational History   Not on file  Tobacco Use   Smoking status: Never   Smokeless tobacco: Never  Vaping Use   Vaping Use: Never used  Substance and Sexual Activity   Alcohol use: No     Alcohol/week: 0.0 standard drinks   Drug use: No   Sexual activity: Not on file  Other Topics Concern   Not on file  Social History Narrative   Had 2 kids. Daughter died in 01/26/12 son living    Former Chartered loss adjuster    Social Determinants of Radio broadcast assistant Strain: Low Risk    Difficulty of Paying Living Expenses: Not hard at all  Food Insecurity: No Food Insecurity  Worried About Charity fundraiser in the Last Year: Never true   [place] in the Last Year: Never true  Transportation Needs: No Transportation Needs   Lack of Transportation (Medical): No   Lack of Transportation (Non-Medical): No  Physical Activity: Sufficiently Active   Days of Exercise per Week: 5 days   Minutes of Exercise per Session: 30 min  Stress: No Stress Concern Present   Feeling of Stress : Not at all  Social Connections: Unknown   Frequency of Communication with Friends and Family: More than three times a week   Frequency of Social Gatherings with Friends and Family: More than three times a week   Attends Religious Services: More than 4 times per year   Active Member of [employer] or Organizations: Yes   Attends Music therapist: More than 4 times per year   Marital Status: Not on file   Tobacco Counseling Counseling given: Not Answered  Clinical Intake: Pre-visit preparation completed: Yes        Diabetes: No  How often do you need to have someone help you when you read instructions, pamphlets, or other written materials from your doctor or pharmacy?: 1 - Never    Interpreter Needed?: No    Activities of Daily Living    04/10/2022   10:47 AM  In your present state of health, do you have any difficulty performing the following activities:  Hearing? 0  Vision? 0  Difficulty concentrating or making decisions? 0  Walking or climbing stairs? 0  Dressing or bathing? 0  Doing errands, shopping? 0  Preparing Food and eating ? N  Using the Toilet? N  In  the past six months, have you accidently leaked urine? N  Do you have problems with loss of bowel control? N  Managing your Medications? N  Managing your Finances? N  Housekeeping or managing your Housekeeping? N    Patient Care Team: Einar Pheasant, MD as PCP - General (Internal Medicine)  Indicate any recent Medical Services you may have received from other than [facility] providers in the past year (date may be approximate).     Assessment:   This is a routine wellness examination for Morgan Moore.  Virtual Visit via Telephone Note  I connected with  Morgan Moore on 04/10/22 at 10:30 AM EDT by telephone and verified that I am speaking with the correct person using two identifiers.  Persons participating in the virtual visit: patient/Nurse Health Advisor   I discussed the limitations of performing an evaluation and management service by telehealth.  We continued and completed visit with audio only. Some vital signs may be absent or patient reported.   Hearing/Vision screen Hearing Screening - Comments:: Followed by [facility], Dr. Charolett Bumpers due to hearing changes. Patient is able to hear conversational tones.  Vision Screening - Comments:: Followed by [facility]  Wears corrective lenses when reading  Cataract extraction, bilateral They have seen their ophthalmologist in the last 12 months.   Dietary issues and exercise activities discussed: Current Exercise Habits: Home exercise routine, Intensity: Mild Healthy diet Good water intake   Goals Addressed               This Visit's Progress     Patient Stated     Follow up with Primary Care Provider (pt-stated)   On track     Monitor blood pressure  Stay hydrated       Depression Screen    04/10/2022  11:00 AM 04/08/2022   10:20 AM 10/09/2021    2:46 PM 08/26/2021   12:27 PM 06/11/2021   12:33 PM 04/08/2021    8:31 AM 03/19/2021    2:09 PM  PHQ 2/9 Scores  PHQ - 2 Score  1 0 0 0 0 0  PHQ- 9 Score  2  1 0  0 1  Exception Documentation Other- indicate reason in comment box          Fall Risk    04/10/2022   10:46 AM 04/08/2022   10:19 AM 10/09/2021    2:46 PM 06/02/2021    2:21 PM 11/29/2020    8:33 AM  Fall Risk   Falls in the past year?  1 1 0 0  Comment None since last reported 2 days.      Number falls in past yr:  0 0  0  Injury with Fall?  0 0  0  Risk for fall due to :  History of fall(s) History of fall(s)    Follow up Falls evaluation completed Falls evaluation completed Falls evaluation completed  Falls evaluation completed   [place]: Home free of loose throw rugs in walkways, pet beds, electrical cords, etc? Yes  Adequate lighting in your home to reduce risk of falls? Yes   ASSISTIVE DEVICES UTILIZED TO PREVENT FALLS: Life alert? No  Use of a cane, walker or w/c? No   TIMED UP AND GO: Was the test performed? No .   Cognitive Function:  Patient is alert and oriented x3.  Manages her own finances and medications without issue.       04/10/2022   11:04 AM  6CIT Screen  What Year? 0 points  What month? 0 points  What time? 0 points  Count back from 20 0 points  Months in reverse 0 points  Repeat phrase 0 points  Total Score 0 points    Immunizations Immunization History  Administered Date(s) Administered   Tdap 05/26/2010   Screening Tests Health Maintenance  Topic Date Due   Pneumonia Vaccine 33+ Years old (1 - PCV) 10/09/2022 (Originally 02/14/2020)   MAMMOGRAM  08/02/2023   COLONOSCOPY (Pts 45-69yr Insurance coverage will need to be confirmed)  10/19/2029   DEXA SCAN  Completed   Hepatitis C Screening  Completed   HPV VACCINES  Aged Out   INFLUENZA VACCINE  Discontinued   TETANUS/TDAP  Discontinued   COVID-19 Vaccine  Discontinued   Zoster Vaccines- Shingrix  Discontinued   Health Maintenance There are no preventive care reminders to display for this patient.  Lung Cancer Screening: (Low Dose CT Chest recommended  if Age 67-80years, 30 pack-year currently smoking OR have quit w/in 15years.) does not qualify.   Vision Screening: Recommended annual ophthalmology exams for early detection of glaucoma and other disorders of the eye.  Dental Screening: Recommended annual dental exams for proper oral hygiene  Community Resource Referral / Chronic Care Management: CRR required this visit?  No   CCM required this visit?  No      Plan:   Keep all routine maintenance appointments.   I have personally reviewed and noted the following in the patient's chart:   Medical and social history Use of alcohol, tobacco or illicit drugs  Current medications and supplements including opioid prescriptions.  Functional ability and status Nutritional status Physical activity Advanced directives List of other physicians Hospitalizations, surgeries, and ER visits in previous 12 months Vitals Screenings to  include cognitive, depression, and falls Referrals and appointments  In addition, I have reviewed and discussed with patient certain preventive protocols, quality metrics, and best practice recommendations. A written personalized care plan for preventive services as well as general preventive health recommendations were provided to patient.     Varney Biles, LPN   12/13/5359

## 2022-04-10 NOTE — Patient Instructions (Addendum)
  Morgan Moore , Thank you for taking time to come for your Medicare Wellness Visit. I appreciate your ongoing commitment to your health goals. Please review the following plan we discussed and let me know if I can assist you in the future.   These are the goals we discussed:  Goals       Patient Stated     Follow up with Primary Care Provider (pt-stated)      Monitor blood pressure  Stay hydrated        This is a list of the screening recommended for you and due dates:  Health Maintenance  Topic Date Due   Pneumonia Vaccine (1 - PCV) 10/09/2022*   Mammogram  08/02/2023   Colon Cancer Screening  10/19/2029   DEXA scan (bone density measurement)  Completed   Hepatitis C Screening: USPSTF Recommendation to screen - Ages 35-79 yo.  Completed   HPV Vaccine  Aged Out   Flu Shot  Discontinued   Tetanus Vaccine  Discontinued   COVID-19 Vaccine  Discontinued   Zoster (Shingles) Vaccine  Discontinued  *Topic was postponed. The date shown is not the original due date.

## 2022-04-12 ENCOUNTER — Encounter (INDEPENDENT_AMBULATORY_CARE_PROVIDER_SITE_OTHER): Payer: Self-pay | Admitting: Vascular Surgery

## 2022-04-12 ENCOUNTER — Encounter: Payer: Self-pay | Admitting: Internal Medicine

## 2022-04-12 DIAGNOSIS — M25579 Pain in unspecified ankle and joints of unspecified foot: Secondary | ICD-10-CM | POA: Insufficient documentation

## 2022-04-12 NOTE — Assessment & Plan Note (Signed)
Continue crestor 

## 2022-04-12 NOTE — Assessment & Plan Note (Signed)
Blood pressure varying.  Adjusting verapamil per pt.  Wants to follow.  Spot check pressures.  Follow metabolic panel.

## 2022-04-12 NOTE — Assessment & Plan Note (Signed)
Overall appears to be doing well. On lexapro.  Follow.

## 2022-04-12 NOTE — Assessment & Plan Note (Signed)
No upper symptoms reported.  On protonix.

## 2022-04-12 NOTE — Assessment & Plan Note (Signed)
Has been on lialda.  Bowels have been stable.  Follow.

## 2022-04-12 NOTE — Assessment & Plan Note (Signed)
Follow cbc.  

## 2022-04-12 NOTE — Assessment & Plan Note (Signed)
Continue crestor and aspirin.  Continue risk factor modification.  Seeing cardiology.

## 2022-04-12 NOTE — Assessment & Plan Note (Signed)
Appears to be handling things relatively well.  Continue lexapro.

## 2022-04-12 NOTE — Assessment & Plan Note (Signed)
Heel/ankle - pain soft tissue swelling. Supports.  Follow.  Discussed podiatry referral.  Will notify me if desires referral.

## 2022-04-12 NOTE — Assessment & Plan Note (Signed)
Continue crestor and aspirin.  Continue risk factor modification.  Seeing cardiology.  Has appt with Dr Delana Meyer tomorrow.

## 2022-04-12 NOTE — Assessment & Plan Note (Signed)
Continue crestor.  Low cholesterol diet and exercise. Follow lipid panel and liver function tests.

## 2022-05-27 ENCOUNTER — Other Ambulatory Visit (INDEPENDENT_AMBULATORY_CARE_PROVIDER_SITE_OTHER): Payer: Self-pay | Admitting: Vascular Surgery

## 2022-05-27 DIAGNOSIS — I83819 Varicose veins of unspecified lower extremities with pain: Secondary | ICD-10-CM

## 2022-05-27 DIAGNOSIS — I872 Venous insufficiency (chronic) (peripheral): Secondary | ICD-10-CM

## 2022-05-27 NOTE — Progress Notes (Signed)
MRN : [id number]  Morgan Moore is a 66 y.o. ([birth year]) female who presents with chief complaint of legs hurt and swell.  History of Present Illness:   The patient returns for followup evaluation several months after the initial visit. The patient continues to have pain in the lower extremities with dependency. The pain is lessened with elevation. Graduated compression stockings, Class I (20-30 mmHg), have been worn but the stockings do not eliminate the leg pain. Over-the-counter analgesics do not improve the symptoms. The degree of discomfort continues to interfere with daily activities. The patient notes the pain in the legs is causing problems with daily exercise, at the workplace and even with household activities and maintenance such as standing in the kitchen preparing meals and doing dishes.   She notes that she has had several episodes of superficial phlebitis and also episodes of hemorrhage.  Venous ultrasound shows normal deep venous system, no evidence of acute or chronic DVT.  Superficial reflux is present in the varicosities but not in the saphenous system.   The patient is also followed for carotid stenosis. The carotid stenosis is being evaluated using serial ultrasound.    The patient denies amaurosis fugax. There is no recent history of TIA symptoms or focal motor deficits. There is no prior documented CVA.   The patient is taking enteric-coated aspirin 81 mg daily.   There is no history of migraine headaches. There is no history of seizures.    No outpatient medications have been marked as taking for the 05/28/22 encounter (Appointment) with Delana Meyer, Dolores Lory, MD.    Past Medical History:  Diagnosis Date   Allergy    Anemia    Anxiety    Chicken pox    Complication of anesthesia    vomitting   Coronary artery disease    Crohn disease ([place])    DVT (deep venous thrombosis) ([place]) 2017   Dyspnea    on excertion   Dysrhythmia 2019    palpitations   GERD (gastroesophageal reflux disease)    Heart disease    Heart murmur    Heart murmur    History of kidney stones    Hyperlipidemia    Hypertension    Inflammatory bowel disease    Migraines    hormonal, puberty   Myocardial infarction ([place])    Phlebitis    PONV (postoperative nausea and vomiting)     Past Surgical History:  Procedure Laterality Date   BREAST BIOPSY Left 07/05/2019   FIBROEPITHELIAL lesion/neg   BREAST CYST ASPIRATION     unsure of side   CARPAL TUNNEL RELEASE Right 12/02/2017   Procedure: CARPAL TUNNEL RELEASE ENDOSCOPIC;  Surgeon: Corky Mull, MD;  Location: [facility] ORS;  Service: Orthopedics;  Laterality: Right;   CORONARY ANGIOPLASTY WITH STENT PLACEMENT  2017   x 2   EYE SURGERY Bilateral    cataract surgery   GANGLION CYST EXCISION Right 12/02/2017   Procedure: REMOVAL GANGLION OF WRIST;  Surgeon: Corky Mull, MD;  Location: [facility] ORS;  Service: Orthopedics;  Laterality: Right;   heart murmur     HYSTEROSCOPY WITH D & C N/A 04/19/2020   Procedure: DILATATION AND CURETTAGE /HYSTEROSCOPY, POSSIBLE  POLYPECTOMY;  Surgeon: Benjaman Kindler, MD;  Location: [facility] ORS;  Service: Gynecology;  Laterality: N/A;   [place]  Social History Social History   Tobacco Use   Smoking status: Never   Smokeless tobacco: Never  Vaping Use   Vaping Use: Never used  Substance Use Topics   Alcohol use: No    Alcohol/week: 0.0 standard drinks of alcohol   Drug use: No    Family History Family History  Problem Relation Age of Onset   Heart disease Father    Heart disease Brother    Cancer Maternal Aunt    Breast cancer Maternal Aunt    Stroke Mother    Kidney disease Neg Hx    GU problems Neg Hx    Kidney cancer Neg Hx     Allergies  Allergen Reactions   Ciprofloxacin Anaphylaxis   Keflex [Cephalexin] Anaphylaxis   Lisinopril Anaphylaxis   Meclizine Anaphylaxis    Monurol [Fosfomycin Tromethamine  (Obsolete)] Rash    Splotchy rash all over body. Breathing became difficult/tightened. Took benadryl immediately so she did not swell    Sulfa Antibiotics Anaphylaxis   Tessalon [Benzonatate] Anaphylaxis   Influenza Virus Vaccine Other (See Comments)    History of Epstein Barr   Codeine Nausea And Vomiting     REVIEW OF SYSTEMS (Negative unless checked)  Constitutional: [] Weight loss  [] Fever  [] Chills Cardiac: [] Chest pain   [] Chest pressure   [] Palpitations   [] Shortness of breath when laying flat   [] Shortness of breath with exertion. Vascular:  [] Pain in legs with walking   [x] Pain in legs at rest  [] History of DVT   [] Phlebitis   [x] Swelling in legs   [] Varicose veins   [] Non-healing ulcers Pulmonary:   [] Uses home oxygen   [] Productive cough   [] Hemoptysis   [] Wheeze  [] COPD   [] Asthma Neurologic:  [] Dizziness   [] Seizures   [] History of stroke   [] History of TIA  [] Aphasia   [] Vissual changes   [] Weakness or numbness in arm   [] Weakness or numbness in leg Musculoskeletal:   [] Joint swelling   [] Joint pain   [] Low back pain Hematologic:  [] Easy bruising  [] Easy bleeding   [] Hypercoagulable state   [] Anemic Gastrointestinal:  [] Diarrhea   [] Vomiting  [] Gastroesophageal reflux/heartburn   [] Difficulty swallowing. Genitourinary:  [] Chronic kidney disease   [] Difficult urination  [] Frequent urination   [] Blood in urine Skin:  [] Rashes   [] Ulcers  Psychological:  [] History of anxiety   []  History of major depression.  Physical Examination  There were no vitals filed for this visit. There is no height or weight on file to calculate BMI. Gen: WD/WN, NAD Head: [place]/AT, No temporalis wasting.  Ear/Nose/Throat: Hearing grossly intact, nares w/o erythema or drainage, pinna without lesions Eyes: PER, EOMI, sclera nonicteric.  Neck: Supple, no gross masses.  No JVD.  Pulmonary:  Good air movement, no audible wheezing, no use of accessory muscles.  Cardiac:  RRR, precordium not hyperdynamic. Vascular:  scattered varicosities present bilaterally that are >8 mm.  Moderate venous stasis changes to the legs bilaterally.  2+ soft pitting edema  Vessel Right Left  Radial Palpable Palpable  Gastrointestinal: soft, non-distended. No guarding/no peritoneal signs.  Musculoskeletal: M/S 5/5 throughout.  No deformity.  Neurologic: CN 2-12 intact. Pain and light touch intact in extremities.  Symmetrical.  Speech is fluent. Motor exam as listed above. Psychiatric: Judgment intact, Mood & affect appropriate for pt's clinical situation. Dermatologic: Venous rashes no ulcers noted.  No changes consistent with cellulitis. Lymph : No lichenification or skin changes of chronic lymphedema.  CBC Lab Results  Component Value Date  WBC 8.4 10/07/2021   HGB 13.5 10/07/2021   HCT 39.7 10/07/2021   MCV 94.2 10/07/2021   PLT 315.0 10/07/2021    BMET    Component Value Date/Time   NA 139 02/06/2022 0918   NA 135 02/25/2015 0950   K 3.7 02/06/2022 0918   K 3.8 02/25/2015 0950   CL 104 02/06/2022 0918   CL 97 (L) 02/25/2015 0950   CO2 27 02/06/2022 0918   CO2 29 02/25/2015 0950   GLUCOSE 88 02/06/2022 0918   GLUCOSE 100 (H) 02/25/2015 0950   BUN 17 02/06/2022 0918   BUN 12 02/25/2015 0950   CREATININE 0.81 02/06/2022 0918   CREATININE 0.83 02/25/2015 0950   CALCIUM 9.4 02/06/2022 0918   CALCIUM 8.9 02/25/2015 0950   GFRNONAA >60 04/01/2021 2228   GFRNONAA >60 02/25/2015 0950   GFRAA >60 11/19/2017 1217   GFRAA >60 02/25/2015 0950   CrCl cannot be calculated (Patient's most recent lab result is older than the maximum 21 days allowed.).  COAG Lab Results  Component Value Date   INR 1.0 12/23/2017   INR 1.1 09/21/2014    Radiology No results found.   Assessment/Plan 1. Varicose veins of both lower extremities with pain Recommend:  The patient has had episodes fo phlebitis and hemorrhage from her varicosities and she is still having  persistent symptoms of pain and swelling that are having a negative impact on daily life and daily activities.  Patient should undergo injection sclerotherapy to treat the residual varicosities.  The risks, benefits and alternative therapies were reviewed in detail with the patient.  All questions were answered.  The patient agrees to proceed with sclerotherapy at their convenience.  The patient will continue wearing the graduated compression stockings and using the over-the-counter pain medications to treat her symptoms.    2. Chronic venous insufficiency See #1  3. Carotid artery disease, unspecified laterality, unspecified type ([place]) Recommend:   Given the patient's asymptomatic subcritical stenosis no further invasive testing or surgery at this time.   Duplex ultrasound shows 1-39% stenosis bilaterally.   Continue antiplatelet therapy as prescribed Continue management of CAD, HTN and Hyperlipidemia Healthy heart diet,  encouraged exercise at least 4 times per week Follow up in 12 months with duplex ultrasound and physical exam    4. Coronary artery disease of native artery of native heart with stable angina pectoris (HCC) Continue cardiac and antihypertensive medications as already ordered and reviewed, no changes at this time.  Continue statin as ordered and reviewed, no changes at this time  Nitrates PRN for chest pain   5. Essential hypertension Continue antihypertensive medications as already ordered, these medications have been reviewed and there are no changes at this time.     Hortencia Pilar, MD  05/27/2022 10:37 AM

## 2022-05-28 ENCOUNTER — Ambulatory Visit (INDEPENDENT_AMBULATORY_CARE_PROVIDER_SITE_OTHER): Payer: Medicare PPO

## 2022-05-28 ENCOUNTER — Encounter (INDEPENDENT_AMBULATORY_CARE_PROVIDER_SITE_OTHER): Payer: Self-pay | Admitting: Vascular Surgery

## 2022-05-28 ENCOUNTER — Ambulatory Visit (INDEPENDENT_AMBULATORY_CARE_PROVIDER_SITE_OTHER): Payer: Medicare PPO | Admitting: Vascular Surgery

## 2022-05-28 VITALS — BP 132/70 | HR 59 | Resp 17 | Ht 61.0 in | Wt 165.0 lb

## 2022-05-28 DIAGNOSIS — I872 Venous insufficiency (chronic) (peripheral): Secondary | ICD-10-CM

## 2022-05-28 DIAGNOSIS — I83819 Varicose veins of unspecified lower extremities with pain: Secondary | ICD-10-CM | POA: Diagnosis not present

## 2022-05-28 DIAGNOSIS — I83813 Varicose veins of bilateral lower extremities with pain: Secondary | ICD-10-CM | POA: Diagnosis not present

## 2022-05-28 DIAGNOSIS — I779 Disorder of arteries and arterioles, unspecified: Secondary | ICD-10-CM

## 2022-05-28 DIAGNOSIS — I10 Essential (primary) hypertension: Secondary | ICD-10-CM

## 2022-05-28 DIAGNOSIS — I25118 Atherosclerotic heart disease of native coronary artery with other forms of angina pectoris: Secondary | ICD-10-CM

## 2022-05-31 ENCOUNTER — Encounter (INDEPENDENT_AMBULATORY_CARE_PROVIDER_SITE_OTHER): Payer: Self-pay | Admitting: Vascular Surgery

## 2022-06-04 ENCOUNTER — Other Ambulatory Visit (INDEPENDENT_AMBULATORY_CARE_PROVIDER_SITE_OTHER): Payer: Medicare PPO

## 2022-06-04 DIAGNOSIS — I10 Essential (primary) hypertension: Secondary | ICD-10-CM | POA: Diagnosis not present

## 2022-06-04 DIAGNOSIS — E7800 Pure hypercholesterolemia, unspecified: Secondary | ICD-10-CM | POA: Diagnosis not present

## 2022-06-04 LAB — LIPID PANEL
Cholesterol: 112 mg/dL (ref 0–200)
HDL: 49 mg/dL (ref >39.00)
LDL Cholesterol: 37 mg/dL (ref 0–99)
NonHDL: 62.66
Total CHOL/HDL Ratio: 2
Triglycerides: 129 mg/dL (ref 0.0–149.0)
VLDL: 25.8 mg/dL (ref 0.0–40.0)

## 2022-06-04 LAB — HEPATIC FUNCTION PANEL
ALT: 20 U/L (ref 0–35)
AST: 19 U/L (ref 0–37)
Albumin: 4.3 g/dL (ref 3.5–5.2)
Alkaline Phosphatase: 81 U/L (ref 39–117)
Bilirubin, Direct: 0.1 mg/dL (ref 0.0–0.3)
Total Bilirubin: 0.6 mg/dL (ref 0.2–1.2)
Total Protein: 6.6 g/dL (ref 6.0–8.3)

## 2022-06-04 LAB — BASIC METABOLIC PANEL
BUN: 16 mg/dL (ref 6–23)
CO2: 28 mEq/L (ref 19–32)
Calcium: 9.3 mg/dL (ref 8.4–10.5)
Chloride: 105 mEq/L (ref 96–112)
Creatinine, Ser: 0.85 mg/dL (ref 0.40–1.20)
GFR: 70.95 mL/min (ref >60.00)
Glucose, Bld: 94 mg/dL (ref 70–99)
Potassium: 4.2 mEq/L (ref 3.5–5.1)
Sodium: 141 mEq/L (ref 135–145)

## 2022-06-04 LAB — TSH: TSH: 2.93 u[IU]/mL (ref 0.35–5.50)

## 2022-06-09 ENCOUNTER — Ambulatory Visit: Payer: Medicare PPO | Admitting: Internal Medicine

## 2022-06-09 ENCOUNTER — Encounter: Payer: Self-pay | Admitting: Internal Medicine

## 2022-06-09 VITALS — BP 128/72 | HR 76 | Temp 98.2°F | Resp 17 | Ht 61.0 in | Wt 163.6 lb

## 2022-06-09 DIAGNOSIS — K219 Gastro-esophageal reflux disease without esophagitis: Secondary | ICD-10-CM

## 2022-06-09 DIAGNOSIS — I700 Atherosclerosis of aorta: Secondary | ICD-10-CM | POA: Diagnosis not present

## 2022-06-09 DIAGNOSIS — F320 Major depressive disorder, single episode, mild: Secondary | ICD-10-CM

## 2022-06-09 DIAGNOSIS — I779 Disorder of arteries and arterioles, unspecified: Secondary | ICD-10-CM | POA: Diagnosis not present

## 2022-06-09 DIAGNOSIS — I25118 Atherosclerotic heart disease of native coronary artery with other forms of angina pectoris: Secondary | ICD-10-CM | POA: Diagnosis not present

## 2022-06-09 DIAGNOSIS — K50919 Crohn's disease, unspecified, with unspecified complications: Secondary | ICD-10-CM | POA: Diagnosis not present

## 2022-06-09 DIAGNOSIS — I10 Essential (primary) hypertension: Secondary | ICD-10-CM | POA: Diagnosis not present

## 2022-06-09 DIAGNOSIS — Z1231 Encounter for screening mammogram for malignant neoplasm of breast: Secondary | ICD-10-CM

## 2022-06-09 DIAGNOSIS — M25551 Pain in right hip: Secondary | ICD-10-CM | POA: Diagnosis not present

## 2022-06-09 DIAGNOSIS — D509 Iron deficiency anemia, unspecified: Secondary | ICD-10-CM

## 2022-06-09 DIAGNOSIS — F439 Reaction to severe stress, unspecified: Secondary | ICD-10-CM

## 2022-06-09 DIAGNOSIS — E7800 Pure hypercholesterolemia, unspecified: Secondary | ICD-10-CM

## 2022-06-09 NOTE — Progress Notes (Signed)
Patient ID: Morgan Moore, female   DOB: 1956/03/21, 66 y.o.   MRN: [id number]   Subjective:    Patient ID: Morgan Moore, female    DOB: 10-09-1956, 66 y.o.   MRN: [id number]   Patient here for a scheduled follow up.   Chief Complaint  Patient presents with   Follow-up    2 month follow    .   HPI Here to follow up regarding her blood pressure, CAD and cholesterol.  Saw [facility] - discussed sclerotherapy - compression hose.  Evaluated carotids - 04/09/22 - 1-39% - f/u in 12 months.  No chest pain.  Breathing stable.  No cough or congestion.  No acid reflux.  No abdominal pain.  Bowels moving.  Handling stress.     Past Medical History:  Diagnosis Date   Allergy    Anemia    Anxiety    Chicken pox    Complication of anesthesia    vomitting   Coronary artery disease    Crohn disease ([place])    DVT (deep venous thrombosis) ([place]) 2017   Dyspnea    on excertion   Dysrhythmia 2019   palpitations   GERD (gastroesophageal reflux disease)    Heart disease    Heart murmur    Heart murmur    History of kidney stones    Hyperlipidemia    Hypertension    Inflammatory bowel disease    Migraines    hormonal, puberty   Myocardial infarction ([place])    Phlebitis    PONV (postoperative nausea and vomiting)    Past Surgical History:  Procedure Laterality Date   BREAST BIOPSY Left 07/05/2019   FIBROEPITHELIAL lesion/neg   BREAST CYST ASPIRATION     unsure of side   CARPAL TUNNEL RELEASE Right 12/02/2017   Procedure: CARPAL TUNNEL RELEASE ENDOSCOPIC;  Surgeon: Corky Mull, MD;  Location: [facility] ORS;  Service: Orthopedics;  Laterality: Right;   CORONARY ANGIOPLASTY WITH STENT PLACEMENT  2017   x 2   EYE SURGERY Bilateral    cataract surgery   GANGLION CYST EXCISION Right 12/02/2017   Procedure: REMOVAL GANGLION OF WRIST;  Surgeon: Corky Mull, MD;  Location: [facility] ORS;  Service: Orthopedics;  Laterality: Right;   heart murmur     HYSTEROSCOPY WITH D & C N/A 04/19/2020    Procedure: DILATATION AND CURETTAGE /HYSTEROSCOPY, POSSIBLE  POLYPECTOMY;  Surgeon: Benjaman Kindler, MD;  Location: [facility] ORS;  Service: Gynecology;  Laterality: N/A;   TONSILLECTOMY AND ADENOIDECTOMY  1962   TUBAL LIGATION  1993   tubaligation  1990   Family History  Problem Relation Age of Onset   Heart disease Father    Heart disease Brother    Cancer Maternal Aunt    Breast cancer Maternal Aunt    Stroke Mother    Kidney disease Neg Hx    GU problems Neg Hx    Kidney cancer Neg Hx    Social History   Socioeconomic History   Marital status: Single    Spouse name: Not on file   Number of children: Not on file   Years of education: Not on file   Highest education level: Not on file  Occupational History   Not on file  Tobacco Use   Smoking status: Never   Smokeless tobacco: Never  Vaping Use   Vaping Use: Never used  Substance and Sexual Activity   Alcohol use: No    Alcohol/week: 0.0 standard drinks of alcohol  Drug use: No   Sexual activity: Not on file  Other Topics Concern   Not on file  Social History Narrative   Had 2 kids. Daughter died in 2012/01/27 son living    Former Chartered loss adjuster    Social Determinants of Health   Financial Resource Strain: [place]  (04/10/2022)   Overall Financial Resource Strain (CARDIA)    Difficulty of Paying Living Expenses: Not hard at all  Food Insecurity: No Food Insecurity (04/10/2022)   Hunger Vital Sign    Worried About Running Out of Food in the Last Year: Never true    Ran Out of Food in the Last Year: Never true  Transportation Needs: No Transportation Needs (04/10/2022)   PRAPARE - Hydrologist (Medical): No    Lack of Transportation (Non-Medical): No  Physical Activity: Sufficiently Active (04/10/2022)   Exercise Vital Sign    Days of Exercise per Week: 5 days    Minutes of Exercise per Session: 30 min  Stress: No Stress Concern Present (04/10/2022)   [place]    Feeling of Stress : Not at all  Social Connections: Unknown (04/10/2022)   Social Connection and Isolation Panel [NHANES]    Frequency of Communication with Friends and Family: More than three times a week    Frequency of Social Gatherings with Friends and Family: More than three times a week    Attends Religious Services: More than 4 times per year    Active Member of [employer] or Organizations: Yes    Attends Music therapist: More than 4 times per year    Marital Status: Not on file     Review of Systems  Constitutional:  Negative for appetite change and unexpected weight change.  HENT:  Negative for congestion and sinus pressure.   Respiratory:  Negative for cough, chest tightness and shortness of breath.   Cardiovascular:  Negative for chest pain, palpitations and leg swelling.  Gastrointestinal:  Negative for abdominal pain, diarrhea, nausea and vomiting.  Genitourinary:  Negative for difficulty urinating and dysuria.  Musculoskeletal:  Negative for joint swelling and myalgias.  Skin:  Negative for color change and rash.  Neurological:  Negative for dizziness, light-headedness and headaches.  Psychiatric/Behavioral:  Negative for agitation and dysphoric mood.        Objective:     BP 128/72   Pulse 76   Temp 98.2 F (36.8 C) (Temporal)   Resp 17   Ht 5' 1"  (1.549 m)   Wt 163 lb 9.6 oz (74.2 kg)   SpO2 98%   BMI 30.91 kg/m  Wt Readings from Last 3 Encounters:  06/09/22 163 lb 9.6 oz (74.2 kg)  05/28/22 165 lb (74.8 kg)  04/10/22 168 lb (76.2 kg)    Physical Exam Vitals reviewed.  Constitutional:      General: She is not in acute distress.    Appearance: Normal appearance.  HENT:     Head: Normocephalic and atraumatic.     Right Ear: External ear normal.     Left Ear: External ear normal.  Eyes:     General: No scleral icterus.       Right eye: No discharge.        Left eye: No discharge.      Conjunctiva/sclera: Conjunctivae normal.  Neck:     Thyroid: No thyromegaly.  Cardiovascular:     Rate and Rhythm: Normal rate and regular rhythm.  Pulmonary:     Effort: No respiratory distress.     Breath sounds: Normal breath sounds. No wheezing.  Abdominal:     General: Bowel sounds are normal.     Palpations: Abdomen is soft.     Tenderness: There is no abdominal tenderness.  Musculoskeletal:        General: No swelling or tenderness.     Cervical back: Neck supple. No tenderness.  Lymphadenopathy:     Cervical: No cervical adenopathy.  Skin:    Findings: No erythema or rash.  Neurological:     Mental Status: She is alert.  Psychiatric:        Mood and Affect: Mood normal.        Behavior: Behavior normal.      Outpatient Encounter Medications as of 06/09/2022  Medication Sig   aspirin EC 81 MG tablet Take 81 mg by mouth daily.   Cholecalciferol 25 MCG (1000 UT) tablet Take 1,000 Units by mouth 2 (two) times daily.   clopidogrel (PLAVIX) 75 MG tablet Take 75 mg by mouth daily.   cyanocobalamin 1000 MCG tablet Take 1,000 mcg by mouth daily at 12 noon.    diphenhydrAMINE (BENADRYL) 12.5 MG/5ML elixir Take 12.5 mg by mouth every 6 (six) hours as needed (allergic reaction).   EPINEPHrine 0.3 mg/0.3 mL IJ SOAJ injection Inject 0.3 mg into the muscle once.    escitalopram (LEXAPRO) 20 MG tablet TAKE 1 TABLET BY MOUTH ONCE DAILY. (Patient taking differently: Take 20 mg by mouth at bedtime.)   Fe Fum-FePoly-Vit C-Vit B3 (INTEGRA) 62.5-62.5-40-3 MG CAPS One capsule 3 days per week.   Melatonin-Pyridoxine 3-10 MG TABS Take 1 tablet by mouth at bedtime.   mesalamine (LIALDA) 1.2 g EC tablet Take 1.2 g by mouth 3 (three) times daily with meals.    mupirocin ointment (BACTROBAN) 2 % Apply to affected area bid   nitroGLYCERIN (NITROSTAT) 0.4 MG SL tablet DISSOLVE (1) TABLET UNDER TONGUE AS NEEDED TO RELIEVE CHEST PAIN. MAYREPEAT EVERY 5 MINUTES.   ondansetron (ZOFRAN-ODT) 4 MG  disintegrating tablet Take 1 tablet (4 mg total) by mouth daily as needed for nausea.   prednisoLONE acetate (PRED FORTE) 1 % ophthalmic suspension Place 1 drop into both eyes 2 (two) times daily as needed (for inflammation).   REPATHA 140 MG/ML SOSY Inject into the skin.   rosuvastatin (CRESTOR) 5 MG tablet TAKE 1 TABLET BY MOUTH TWO DAYS A WEEK.   verapamil (CALAN-SR) 120 MG CR tablet Take 120 mg by mouth every evening.    No facility-administered encounter medications on file as of 06/09/2022.     Lab Results  Component Value Date   WBC 8.4 10/07/2021   HGB 13.5 10/07/2021   HCT 39.7 10/07/2021   PLT 315.0 10/07/2021   GLUCOSE 94 06/04/2022   CHOL 112 06/04/2022   TRIG 129.0 06/04/2022   HDL 49.00 06/04/2022   LDLCALC 37 06/04/2022   ALT 20 06/04/2022   AST 19 06/04/2022   NA 141 06/04/2022   K 4.2 06/04/2022   CL 105 06/04/2022   CREATININE 0.85 06/04/2022   BUN 16 06/04/2022   CO2 28 06/04/2022   TSH 2.93 06/04/2022   INR 1.0 12/23/2017    [place] Lower Ext Art Left Ltd  Result Date: 02/04/2022 CLINICAL DATA:  Recent heart catheterization. Evaluate for hematoma or pseudoaneurysm. Left groin pain. EXAM: LEFT LOWER EXTREMITY LIMITED SOFT TISSUE ULTRASOUND TECHNIQUE: Ultrasound examination of the lower extremity soft tissues was performed in the area of  clinical concern. COMPARISON:  CT abdomen and pelvis 03/28/2017 FINDINGS: Normal appearance of the left common femoral vein and left saphenofemoral junction. Left common femoral artery is patent. There is no evidence for a pseudoaneurysm. No large hematoma formation. IMPRESSION: Negative for pseudoaneurysm in the left groin.  No large hematoma. Electronically Signed   By: Markus Daft M.D.   On: 02/04/2022 16:44       Assessment & Plan:   Problem List Items Addressed This Visit     Anemia    Follow cbc.       Aortic atherosclerosis (HCC)    Continue crestor.        Carotid artery disease ([place])    Continue crestor and  aspirin.  Continue risk factor modification.  Seeing cardiology.        Coronary artery disease of native artery of native heart with stable angina pectoris ([place])    Continue crestor and aspirin.  Continue risk factor modification.  Seeing cardiology.       Crohn disease ([place])    Has been on lialda.  Bowels have been stable.  Follow.       Essential hypertension    On verapamil.  Follow pressures.  Follow metabolic panel.       GERD (gastroesophageal reflux disease)    No upper symptoms reported.  On protonix.       Hip pain    Right hip and mid back pain.  Taking tylenol prn.  Notify me if desires any further intervention.  Follow.       Hypercholesterolemia    Continue crestor.  Low cholesterol diet and exercise. Follow lipid panel and liver function tests.        Major depressive disorder, single episode, mild (HCC)    Overall appears to be doing well. On lexapro.  Follow.       Stress    Appears to be handling things relatively well.  Continue lexapro.       Other Visit Diagnoses     Encounter for screening mammogram for malignant neoplasm of breast    -  Primary   Relevant Orders   MM 3D SCREEN BREAST BILATERAL        Einar Pheasant, MD

## 2022-06-14 ENCOUNTER — Encounter: Payer: Self-pay | Admitting: Internal Medicine

## 2022-06-14 NOTE — Assessment & Plan Note (Signed)
On verapamil.  Follow pressures.  Follow metabolic panel.

## 2022-06-14 NOTE — Assessment & Plan Note (Signed)
No upper symptoms reported.  On protonix.

## 2022-06-14 NOTE — Assessment & Plan Note (Signed)
Has been on lialda.  Bowels have been stable.  Follow.

## 2022-06-14 NOTE — Assessment & Plan Note (Signed)
Continue crestor 

## 2022-06-14 NOTE — Assessment & Plan Note (Signed)
Continue crestor and aspirin.  Continue risk factor modification.  Seeing cardiology.

## 2022-06-14 NOTE — Assessment & Plan Note (Signed)
Appears to be handling things relatively well.  Continue lexapro.

## 2022-06-14 NOTE — Assessment & Plan Note (Signed)
Overall appears to be doing well. On lexapro.  Follow.

## 2022-06-14 NOTE — Assessment & Plan Note (Signed)
Right hip and mid back pain.  Taking tylenol prn.  Notify me if desires any further intervention.  Follow.

## 2022-06-14 NOTE — Assessment & Plan Note (Signed)
Follow cbc.  

## 2022-06-14 NOTE — Assessment & Plan Note (Signed)
Continue crestor.  Low cholesterol diet and exercise. Follow lipid panel and liver function tests.

## 2022-06-18 ENCOUNTER — Telehealth: Payer: Self-pay | Admitting: Internal Medicine

## 2022-06-18 NOTE — Telephone Encounter (Signed)
Pt need a refill on Escitalopram, rosuvastatin and  INTEGRA sent to [facility] in [place]

## 2022-06-19 ENCOUNTER — Other Ambulatory Visit: Payer: Self-pay

## 2022-06-19 MED ORDER — ESCITALOPRAM OXALATE 20 MG PO TABS: 20.0000 mg | ORAL_TABLET | Freq: Every day | ORAL | 0 refills | Status: DC

## 2022-06-19 MED ORDER — INTEGRA 62.5-62.5-40-3 MG PO CAPS: ORAL_CAPSULE | ORAL | 1 refills | Status: DC

## 2022-06-19 MED ORDER — ROSUVASTATIN CALCIUM 5 MG PO TABS: ORAL_TABLET | ORAL | 0 refills | Status: DC

## 2022-06-19 NOTE — Telephone Encounter (Signed)
sent 

## 2022-06-29 ENCOUNTER — Telehealth (INDEPENDENT_AMBULATORY_CARE_PROVIDER_SITE_OTHER): Payer: Self-pay | Admitting: Nurse Practitioner

## 2022-06-29 NOTE — Telephone Encounter (Signed)
Pt was concerned that when she called her insurance, she was told that each visit for saline sclerotherapy would be a $250 copay. I called [employer] this morning and was assured that each visit is on \ly the $40 copay per her plan. Will call pt and advise. Call ref # [id number]. [facility])

## 2022-07-16 ENCOUNTER — Ambulatory Visit (INDEPENDENT_AMBULATORY_CARE_PROVIDER_SITE_OTHER): Payer: Medicare PPO | Admitting: Nurse Practitioner

## 2022-07-16 ENCOUNTER — Encounter (INDEPENDENT_AMBULATORY_CARE_PROVIDER_SITE_OTHER): Payer: Self-pay | Admitting: Nurse Practitioner

## 2022-07-16 VITALS — BP 130/69 | HR 62 | Resp 16 | Wt 166.0 lb

## 2022-07-16 DIAGNOSIS — I8312 Varicose veins of left lower extremity with inflammation: Secondary | ICD-10-CM | POA: Diagnosis not present

## 2022-07-16 DIAGNOSIS — I8311 Varicose veins of right lower extremity with inflammation: Secondary | ICD-10-CM

## 2022-07-16 DIAGNOSIS — I83819 Varicose veins of unspecified lower extremities with pain: Secondary | ICD-10-CM

## 2022-07-30 ENCOUNTER — Telehealth: Payer: Self-pay | Admitting: *Deleted

## 2022-07-30 DIAGNOSIS — H903 Sensorineural hearing loss, bilateral: Secondary | ICD-10-CM | POA: Diagnosis not present

## 2022-07-30 NOTE — Patient Outreach (Signed)
  Care Coordination   07/30/2022 Name: Morgan Moore MRN: [id number] DOB: 1955/12/12   Care Coordination Outreach Attempts:  An unsuccessful telephone outreach was attempted today to offer the patient information about available care coordination services as a benefit of their health plan.   Follow Up Plan:  Additional outreach attempts will be made to offer the patient care coordination information and services.   Encounter Outcome:  No Answer  Care Coordination Interventions Activated:  Yes   Care Coordination Interventions:  No, not indicated    [place] [facility] [phone]

## 2022-08-04 ENCOUNTER — Ambulatory Visit
Admission: RE | Admit: 2022-08-04 | Discharge: 2022-08-04 | Disposition: A | Discharge status: Home / Self Care | Payer: Medicare PPO | Source: Ambulatory Visit | Attending: Internal Medicine | Admitting: Internal Medicine

## 2022-08-04 DIAGNOSIS — Z1231 Encounter for screening mammogram for malignant neoplasm of breast: Secondary | ICD-10-CM | POA: Diagnosis not present

## 2022-08-06 ENCOUNTER — Encounter (INDEPENDENT_AMBULATORY_CARE_PROVIDER_SITE_OTHER): Payer: Self-pay | Admitting: Nurse Practitioner

## 2022-08-06 NOTE — Progress Notes (Signed)
Varicose veins of bilateral lower extremity with inflammation (454.1  I83.10) Current Plans   Indication: Patient presents with symptomatic varicose veins of the bilateral lower extremity.   Procedure: Sclerotherapy using hypertonic saline mixed with 1% Lidocaine was performed on the bilateral lower extremity. Compression wraps were placed. The patient tolerated the procedure well.

## 2022-08-18 ENCOUNTER — Encounter (INDEPENDENT_AMBULATORY_CARE_PROVIDER_SITE_OTHER): Payer: Self-pay | Admitting: Nurse Practitioner

## 2022-08-18 ENCOUNTER — Ambulatory Visit (INDEPENDENT_AMBULATORY_CARE_PROVIDER_SITE_OTHER): Payer: Medicare PPO | Admitting: Nurse Practitioner

## 2022-08-18 VITALS — BP 147/75 | HR 69 | Resp 18 | Ht 61.5 in | Wt 163.0 lb

## 2022-08-18 DIAGNOSIS — I83813 Varicose veins of bilateral lower extremities with pain: Secondary | ICD-10-CM | POA: Diagnosis not present

## 2022-08-19 ENCOUNTER — Encounter (INDEPENDENT_AMBULATORY_CARE_PROVIDER_SITE_OTHER): Payer: Self-pay | Admitting: Nurse Practitioner

## 2022-08-19 NOTE — Progress Notes (Signed)
Varicose veins of bilateral  lower extremity with inflammation (454.1  I83.10) Current Plans   Indication: Patient presents with symptomatic varicose veins of the bilateral  lower extremity.   Procedure: Sclerotherapy using hypertonic saline mixed with 1% Lidocaine was performed on the bilateral lower extremity. Compression wraps were placed. The patient tolerated the procedure well.

## 2022-09-10 DIAGNOSIS — I214 Non-ST elevation (NSTEMI) myocardial infarction: Secondary | ICD-10-CM | POA: Diagnosis not present

## 2022-09-10 DIAGNOSIS — T82855A Stenosis of coronary artery stent, initial encounter: Secondary | ICD-10-CM | POA: Diagnosis not present

## 2022-09-10 DIAGNOSIS — I10 Essential (primary) hypertension: Secondary | ICD-10-CM | POA: Diagnosis not present

## 2022-09-10 DIAGNOSIS — I739 Peripheral vascular disease, unspecified: Secondary | ICD-10-CM | POA: Diagnosis not present

## 2022-09-10 DIAGNOSIS — Z955 Presence of coronary angioplasty implant and graft: Secondary | ICD-10-CM | POA: Diagnosis not present

## 2022-09-10 DIAGNOSIS — E782 Mixed hyperlipidemia: Secondary | ICD-10-CM | POA: Diagnosis not present

## 2022-09-10 DIAGNOSIS — I471 Supraventricular tachycardia, unspecified: Secondary | ICD-10-CM | POA: Diagnosis not present

## 2022-09-10 DIAGNOSIS — I2510 Atherosclerotic heart disease of native coronary artery without angina pectoris: Secondary | ICD-10-CM | POA: Diagnosis not present

## 2022-09-10 DIAGNOSIS — I6523 Occlusion and stenosis of bilateral carotid arteries: Secondary | ICD-10-CM | POA: Diagnosis not present

## 2022-09-14 DIAGNOSIS — M25562 Pain in left knee: Secondary | ICD-10-CM | POA: Diagnosis not present

## 2022-09-14 DIAGNOSIS — S8392XA Sprain of unspecified site of left knee, initial encounter: Secondary | ICD-10-CM | POA: Diagnosis not present

## 2022-09-14 DIAGNOSIS — M25462 Effusion, left knee: Secondary | ICD-10-CM | POA: Diagnosis not present

## 2022-09-14 DIAGNOSIS — X500XXA Overexertion from strenuous movement or load, initial encounter: Secondary | ICD-10-CM | POA: Diagnosis not present

## 2022-09-15 ENCOUNTER — Telehealth: Payer: Self-pay

## 2022-09-15 ENCOUNTER — Ambulatory Visit (INDEPENDENT_AMBULATORY_CARE_PROVIDER_SITE_OTHER): Payer: Medicare PPO | Admitting: Nurse Practitioner

## 2022-09-15 DIAGNOSIS — S83242A Other tear of medial meniscus, current injury, left knee, initial encounter: Secondary | ICD-10-CM | POA: Diagnosis not present

## 2022-09-15 DIAGNOSIS — S83282A Other tear of lateral meniscus, current injury, left knee, initial encounter: Secondary | ICD-10-CM | POA: Diagnosis not present

## 2022-09-15 DIAGNOSIS — M1712 Unilateral primary osteoarthritis, left knee: Secondary | ICD-10-CM | POA: Diagnosis not present

## 2022-09-15 NOTE — Telephone Encounter (Signed)
Need more information.  States was evaluated at [facility] and they ordered MRI.  Have they discussed MRI results with her and does she have f/u with them.  MRI (knee) - reveals - appears to have changes c/w a sprain, appears to have tear - meniscus and arthritis changes.  Let me know if questions.

## 2022-09-15 NOTE — Telephone Encounter (Signed)
Pt called in stating she injured her left knee on Friday.  She went to [facility] orth[facility] on yesterday they ordered an MRI. (Results in Care everywhere)  Pt would like for Dr. Nicki Reaper or someone to discuss the MRI with her as she sees the results on her [place] but does not understand.   She states she has a f/u with Dr. Nicki Reaper on Monday but would like to understand her results before then.

## 2022-09-16 NOTE — Telephone Encounter (Signed)
Lm for pt to cb.

## 2022-09-17 ENCOUNTER — Ambulatory Visit (INDEPENDENT_AMBULATORY_CARE_PROVIDER_SITE_OTHER): Payer: Medicare PPO | Admitting: Nurse Practitioner

## 2022-09-21 DIAGNOSIS — X500XXA Overexertion from strenuous movement or load, initial encounter: Secondary | ICD-10-CM | POA: Diagnosis not present

## 2022-09-21 DIAGNOSIS — M25461 Effusion, right knee: Secondary | ICD-10-CM | POA: Diagnosis not present

## 2022-09-21 DIAGNOSIS — S8001XA Contusion of right knee, initial encounter: Secondary | ICD-10-CM | POA: Diagnosis not present

## 2022-09-21 DIAGNOSIS — M1711 Unilateral primary osteoarthritis, right knee: Secondary | ICD-10-CM | POA: Diagnosis not present

## 2022-09-21 DIAGNOSIS — M2392 Unspecified internal derangement of left knee: Secondary | ICD-10-CM | POA: Diagnosis not present

## 2022-09-28 DIAGNOSIS — M2392 Unspecified internal derangement of left knee: Secondary | ICD-10-CM | POA: Insufficient documentation

## 2022-09-28 DIAGNOSIS — M25461 Effusion, right knee: Secondary | ICD-10-CM | POA: Insufficient documentation

## 2022-09-28 DIAGNOSIS — M1711 Unilateral primary osteoarthritis, right knee: Secondary | ICD-10-CM | POA: Insufficient documentation

## 2022-10-02 ENCOUNTER — Other Ambulatory Visit: Payer: Self-pay | Admitting: Internal Medicine

## 2022-10-08 ENCOUNTER — Encounter (INDEPENDENT_AMBULATORY_CARE_PROVIDER_SITE_OTHER): Payer: Self-pay | Admitting: Nurse Practitioner

## 2022-10-08 ENCOUNTER — Ambulatory Visit (INDEPENDENT_AMBULATORY_CARE_PROVIDER_SITE_OTHER): Payer: Medicare PPO | Admitting: Nurse Practitioner

## 2022-10-08 VITALS — BP 137/78 | HR 71 | Resp 16 | Wt 163.2 lb

## 2022-10-08 DIAGNOSIS — I83813 Varicose veins of bilateral lower extremities with pain: Secondary | ICD-10-CM | POA: Diagnosis not present

## 2022-10-11 NOTE — Progress Notes (Addendum)
Varicose veins of bilateral  lower extremity with inflammation (454.1  I83.10) Current Plans   Indication: Patient presents with symptomatic varicose veins of the bilateral  lower extremity.   Procedure: Sclerotherapy using hypertonic saline mixed with 1% Lidocaine was performed on the bilateral lower extremity. Compression wraps were placed. The patient tolerated the procedure well.   The patient is CEAP [id number].

## 2022-10-26 ENCOUNTER — Other Ambulatory Visit (INDEPENDENT_AMBULATORY_CARE_PROVIDER_SITE_OTHER): Payer: Medicare PPO

## 2022-10-26 DIAGNOSIS — E7800 Pure hypercholesterolemia, unspecified: Secondary | ICD-10-CM

## 2022-10-26 DIAGNOSIS — I10 Essential (primary) hypertension: Secondary | ICD-10-CM

## 2022-10-26 DIAGNOSIS — D509 Iron deficiency anemia, unspecified: Secondary | ICD-10-CM

## 2022-10-26 LAB — CBC WITH DIFFERENTIAL/PLATELET
Basophils Absolute: 0 10*3/uL (ref 0.0–0.1)
Basophils Relative: 0.6 % (ref 0.0–3.0)
Eosinophils Absolute: 0.3 10*3/uL (ref 0.0–0.7)
Eosinophils Relative: 3.4 % (ref 0.0–5.0)
HCT: 39.3 % (ref 36.0–46.0)
Hemoglobin: 13.4 g/dL (ref 12.0–15.0)
Lymphocytes Relative: 17.8 % (ref 12.0–46.0)
Lymphs Abs: 1.4 10*3/uL (ref 0.7–4.0)
MCHC: 34 g/dL (ref 30.0–36.0)
MCV: 93.9 fl (ref 78.0–100.0)
Monocytes Absolute: 0.8 10*3/uL (ref 0.1–1.0)
Monocytes Relative: 9.9 % (ref 3.0–12.0)
Neutro Abs: 5.4 10*3/uL (ref 1.4–7.7)
Neutrophils Relative %: 68.3 % (ref 43.0–77.0)
Platelets: 320 10*3/uL (ref 150.0–400.0)
RBC: 4.19 Mil/uL (ref 3.87–5.11)
RDW: 14 % (ref 11.5–15.5)
WBC: 7.9 10*3/uL (ref 4.0–10.5)

## 2022-10-26 LAB — BASIC METABOLIC PANEL
BUN: 14 mg/dL (ref 6–23)
CO2: 26 mEq/L (ref 19–32)
Calcium: 9.4 mg/dL (ref 8.4–10.5)
Chloride: 107 mEq/L (ref 96–112)
Creatinine, Ser: 0.76 mg/dL (ref 0.40–1.20)
GFR: 80.93 mL/min (ref >60.00)
Glucose, Bld: 95 mg/dL (ref 70–99)
Potassium: 4.2 mEq/L (ref 3.5–5.1)
Sodium: 142 mEq/L (ref 135–145)

## 2022-10-26 LAB — LIPID PANEL
Cholesterol: 105 mg/dL (ref 0–200)
HDL: 49.2 mg/dL (ref >39.00)
LDL Cholesterol: 34 mg/dL (ref 0–99)
NonHDL: 55.6
Total CHOL/HDL Ratio: 2
Triglycerides: 107 mg/dL (ref 0.0–149.0)
VLDL: 21.4 mg/dL (ref 0.0–40.0)

## 2022-10-26 LAB — HEPATIC FUNCTION PANEL
ALT: 14 U/L (ref 0–35)
AST: 15 U/L (ref 0–37)
Albumin: 4.1 g/dL (ref 3.5–5.2)
Alkaline Phosphatase: 70 U/L (ref 39–117)
Bilirubin, Direct: 0.1 mg/dL (ref 0.0–0.3)
Total Bilirubin: 0.4 mg/dL (ref 0.2–1.2)
Total Protein: 6.5 g/dL (ref 6.0–8.3)

## 2022-10-28 ENCOUNTER — Encounter: Payer: Self-pay | Admitting: Internal Medicine

## 2022-10-28 ENCOUNTER — Ambulatory Visit (INDEPENDENT_AMBULATORY_CARE_PROVIDER_SITE_OTHER): Payer: Medicare PPO | Admitting: Internal Medicine

## 2022-10-28 VITALS — BP 138/78 | HR 72 | Temp 98.3°F | Resp 13 | Ht 61.5 in | Wt 160.0 lb

## 2022-10-28 DIAGNOSIS — R251 Tremor, unspecified: Secondary | ICD-10-CM

## 2022-10-28 DIAGNOSIS — Z0000 Encounter for general adult medical examination without abnormal findings: Secondary | ICD-10-CM | POA: Diagnosis not present

## 2022-10-28 DIAGNOSIS — E7800 Pure hypercholesterolemia, unspecified: Secondary | ICD-10-CM

## 2022-10-28 DIAGNOSIS — I700 Atherosclerosis of aorta: Secondary | ICD-10-CM

## 2022-10-28 DIAGNOSIS — D509 Iron deficiency anemia, unspecified: Secondary | ICD-10-CM | POA: Diagnosis not present

## 2022-10-28 DIAGNOSIS — I10 Essential (primary) hypertension: Secondary | ICD-10-CM | POA: Diagnosis not present

## 2022-10-28 DIAGNOSIS — F320 Major depressive disorder, single episode, mild: Secondary | ICD-10-CM

## 2022-10-28 DIAGNOSIS — K219 Gastro-esophageal reflux disease without esophagitis: Secondary | ICD-10-CM | POA: Diagnosis not present

## 2022-10-28 DIAGNOSIS — M25562 Pain in left knee: Secondary | ICD-10-CM

## 2022-10-28 DIAGNOSIS — I779 Disorder of arteries and arterioles, unspecified: Secondary | ICD-10-CM | POA: Diagnosis not present

## 2022-10-28 DIAGNOSIS — K50919 Crohn's disease, unspecified, with unspecified complications: Secondary | ICD-10-CM | POA: Diagnosis not present

## 2022-10-28 DIAGNOSIS — F439 Reaction to severe stress, unspecified: Secondary | ICD-10-CM

## 2022-10-28 DIAGNOSIS — I25118 Atherosclerotic heart disease of native coronary artery with other forms of angina pectoris: Secondary | ICD-10-CM

## 2022-10-28 MED ORDER — ESCITALOPRAM OXALATE 20 MG PO TABS: 20.0000 mg | ORAL_TABLET | Freq: Every day | ORAL | 1 refills | Status: DC

## 2022-10-28 NOTE — Assessment & Plan Note (Addendum)
Physical today 10/28/22.  Mammogram 08/04/22- Bir[facility] I.  Colonoscopy 08/2018.

## 2022-10-28 NOTE — Progress Notes (Signed)
Patient ID: Saleen Peden, female   DOB: [birth year], 66 y.o.   MRN: [id number]   Subjective:    Patient ID: Ronnette Rump, female    DOB: 10-Nov-1955, 66 y.o.   MRN: [id number]   Patient here for her physical exam.   Chief Complaint  Patient presents with   Annual Exam    CPE   .   HPI Saw [facility] - discussed sclerotherapy - compression hose.  Evaluated carotids - 04/09/22 - 1-39% - f/u in 12 months.  No chest pain.  Breathing stable.  No cough or congestion.  No acid reflux.  No abdominal pain.  Bowels moving.  Handling stress.  Seeing ortho - injury - left knee.  Acute injury 09/11/22 - stepped in a hole and hyperextended her knee.  S/p aspiration of joint fluid.  MRI - moderate joint effusion, sprain meniscal tear.  F/u ortho 09/21/22 - hinged knee brace.  PT.  F/u 11/2022.  Right hand tremor.     Past Medical History:  Diagnosis Date   Allergy    Anemia    Anxiety    Chicken pox    Complication of anesthesia    vomitting   Coronary artery disease    Crohn disease ([place])    DVT (deep venous thrombosis) ([place]) 2017   Dyspnea    on excertion   Dysrhythmia 2019   palpitations   GERD (gastroesophageal reflux disease)    Heart disease    Heart murmur    Heart murmur    History of kidney stones    Hyperlipidemia    Hypertension    Inflammatory bowel disease    Migraines    hormonal, puberty   Myocardial infarction ([place])    Phlebitis    PONV (postoperative nausea and vomiting)    Past Surgical History:  Procedure Laterality Date   BREAST BIOPSY Left 07/05/2019   FIBROEPITHELIAL lesion/neg   BREAST CYST ASPIRATION     unsure of side   CARPAL TUNNEL RELEASE Right 12/02/2017   Procedure: CARPAL TUNNEL RELEASE ENDOSCOPIC;  Surgeon: Corky Mull, MD;  Location: [facility] ORS;  Service: Orthopedics;  Laterality: Right;   CORONARY ANGIOPLASTY WITH STENT PLACEMENT  2017   x 2   EYE SURGERY Bilateral    cataract surgery   GANGLION CYST EXCISION Right 12/02/2017   Procedure:  REMOVAL GANGLION OF WRIST;  Surgeon: Corky Mull, MD;  Location: [facility] OR[facility];  Service: Orthopedics;  Laterality: Right;   heart murmur     HYSTEROSCOPY WITH D & C N/A 04/19/2020   Procedure: DILATATION AND CURETTAGE /HYSTEROSCOPY, POSSIBLE  POLYPECTOMY;  Surgeon: Benjaman Kindler, MD;  Location: [facility] ORS;  Service: Gynecology;  Laterality: N/A;   TONSILLECTOMY AND ADENOIDECTOMY  1962   TUBAL LIGATION  1993   tubaligation  1990   Family History  Problem Relation Age of Onset   Heart disease Father    Heart disease Brother    Cancer Maternal Aunt    Breast cancer Maternal Aunt    Stroke Mother    Kidney disease Neg Hx    GU problems Neg Hx    Kidney cancer Neg Hx    Social History   Socioeconomic History   Marital status: Single    Spouse name: Not on file   Number of children: Not on file   Years of education: Not on file   Highest education level: Not on file  Occupational History   Not on file  Tobacco Use  Smoking status: Never   Smokeless tobacco: Never  Vaping Use   Vaping Use: Never used  Substance and Sexual Activity   Alcohol use: No    Alcohol/week: 0.0 standard drinks of alcohol   Drug use: No   Sexual activity: Not on file  Other Topics Concern   Not on file  Social History Narrative   Had 2 kids. Daughter died in 01/17/12 son living    Former Chartered loss adjuster    Social Determinants of Health   Financial Resource Strain: [place]  (04/10/2022)   Overall Financial Resource Strain (CARDIA)    Difficulty of Paying Living Expenses: Not hard at all  Food Insecurity: No Food Insecurity (04/10/2022)   Hunger Vital Sign    Worried About Running Out of Food in the Last Year: Never true    Ran Out of Food in the Last Year: Never true  Transportation Needs: No Transportation Needs (04/10/2022)   PRAPARE - Hydrologist (Medical): No    Lack of Transportation (Non-Medical): No  Physical Activity: Sufficiently Active (04/10/2022)   Exercise  Vital Sign    Days of Exercise per Week: 5 days    Minutes of Exercise per Session: 30 min  Stress: No Stress Concern Present (04/10/2022)   [place]    Feeling of Stress : Not at all  Social Connections: Unknown (04/10/2022)   Social Connection and Isolation Panel [NHANES]    Frequency of Communication with Friends and Family: More than three times a week    Frequency of Social Gatherings with Friends and Family: More than three times a week    Attends Religious Services: More than 4 times per year    Active Member of [employer] or Organizations: Yes    Attends Music therapist: More than 4 times per year    Marital Status: Not on file     Review of Systems  Constitutional:  Negative for appetite change and unexpected weight change.  HENT:  Negative for congestion, sinus pressure and sore throat.   Eyes:  Negative for pain and visual disturbance.  Respiratory:  Negative for cough, chest tightness and shortness of breath.   Cardiovascular:  Negative for chest pain, palpitations and leg swelling.  Gastrointestinal:  Negative for abdominal pain, diarrhea, nausea and vomiting.  Genitourinary:  Negative for difficulty urinating and dysuria.  Musculoskeletal:  Negative for joint swelling and myalgias.  Skin:  Negative for color change and rash.  Neurological:  Negative for dizziness, light-headedness and headaches.  Hematological:  Negative for adenopathy. Does not bruise/bleed easily.  Psychiatric/Behavioral:  Negative for agitation and dysphoric mood.        Objective:     BP 138/78   Pulse 72   Temp 98.3 F (36.8 C) (Temporal)   Resp 13   Ht 5' 1.5" (1.562 m)   Wt 160 lb (72.6 kg)   SpO2 97%   BMI 29.74 kg/m  Wt Readings from Last 3 Encounters:  10/28/22 160 lb (72.6 kg)  10/08/22 163 lb 3.2 oz (74 kg)  08/18/22 163 lb (73.9 kg)    Physical Exam Vitals reviewed.  Constitutional:      General:  She is not in acute distress.    Appearance: Normal appearance. She is well-developed.  HENT:     Head: Normocephalic and atraumatic.     Right Ear: External ear normal.     Left Ear: External ear normal.  Eyes:     General: No scleral icterus.       Right eye: No discharge.        Left eye: No discharge.     Conjunctiva/sclera: Conjunctivae normal.  Neck:     Thyroid: No thyromegaly.  Cardiovascular:     Rate and Rhythm: Normal rate and regular rhythm.  Pulmonary:     Effort: No tachypnea, accessory muscle usage or respiratory distress.     Breath sounds: Normal breath sounds. No decreased breath sounds or wheezing.  Chest:  Breasts:    Right: No inverted nipple, mass, nipple discharge or tenderness (no axillary adenopathy).     Left: No inverted nipple, mass, nipple discharge or tenderness (no axilarry adenopathy).  Abdominal:     General: Bowel sounds are normal.     Palpations: Abdomen is soft.     Tenderness: There is no abdominal tenderness.  Musculoskeletal:        General: No swelling or tenderness.     Cervical back: Neck supple. No tenderness.  Lymphadenopathy:     Cervical: No cervical adenopathy.  Skin:    Findings: No erythema or rash.  Neurological:     Mental Status: She is alert and oriented to person, place, and time.     Comments: Negative cogwheeling.  Gait wnl.   Psychiatric:        Mood and Affect: Mood normal.        Behavior: Behavior normal.      Outpatient Encounter Medications as of 10/28/2022  Medication Sig   aspirin EC 81 MG tablet Take 81 mg by mouth daily.   Cholecalciferol 25 MCG (1000 UT) tablet Take 1,000 Units by mouth 2 (two) times daily.   clopidogrel (PLAVIX) 75 MG tablet Take 75 mg by mouth daily.   cyanocobalamin 1000 MCG tablet Take 1,000 mcg by mouth daily at 12 noon.    diphenhydrAMINE (BENADRYL) 12.5 MG/5ML elixir Take 12.5 mg by mouth every 6 (six) hours as needed (allergic reaction).   EPINEPHrine 0.3 mg/0.3 mL IJ SOAJ  injection Inject 0.3 mg into the muscle once.    Fe Fum-FePoly-Vit C-Vit B3 (INTEGRA) 62.5-62.5-40-3 MG CAPS TAKE ONE CAPSULE BY MOUTH three times WEEKLY.   Melatonin-Pyridoxine 3-10 MG TABS Take 1 tablet by mouth at bedtime.   mesalamine (LIALDA) 1.2 g EC tablet Take 1.2 g by mouth 3 (three) times daily with meals.    mupirocin ointment (BACTROBAN) 2 % Apply to affected area bid   nitroGLYCERIN (NITROSTAT) 0.4 MG SL tablet DISSOLVE (1) TABLET UNDER TONGUE AS NEEDED TO RELIEVE CHEST PAIN. MAYREPEAT EVERY 5 MINUTES.   ondansetron (ZOFRAN-ODT) 4 MG disintegrating tablet Take 1 tablet (4 mg total) by mouth daily as needed for nausea.   prednisoLONE acetate (PRED FORTE) 1 % ophthalmic suspension Place 1 drop into both eyes 2 (two) times daily as needed (for inflammation).   REPATHA 140 MG/ML SOSY Inject into the skin.   rosuvastatin (CRESTOR) 5 MG tablet TAKE 1 TABLET BY MOUTH TWO DAYS A WEEK.   verapamil (CALAN-SR) 120 MG CR tablet Take 120 mg by mouth every evening.    [DISCONTINUED] escitalopram (LEXAPRO) 20 MG tablet Take 1 tablet (20 mg total) by mouth daily.   escitalopram (LEXAPRO) 20 MG tablet Take 1 tablet (20 mg total) by mouth daily.   No facility-administered encounter medications on file as of 10/28/2022.     Lab Results  Component Value Date   WBC 7.9 10/26/2022   HGB 13.4 10/26/2022  HCT 39.3 10/26/2022   PLT 320.0 10/26/2022   GLUCOSE 95 10/26/2022   CHOL 105 10/26/2022   TRIG 107.0 10/26/2022   HDL 49.20 10/26/2022   LDLCALC 34 10/26/2022   ALT 14 10/26/2022   AST 15 10/26/2022   NA 142 10/26/2022   K 4.2 10/26/2022   CL 107 10/26/2022   CREATININE 0.76 10/26/2022   BUN 14 10/26/2022   CO2 26 10/26/2022   TSH 2.93 06/04/2022   INR 1.0 12/23/2017    [place] Lower Ext Art Left Ltd  Result Date: 02/04/2022 CLINICAL DATA:  Recent heart catheterization. Evaluate for hematoma or pseudoaneurysm. Left groin pain. EXAM: LEFT LOWER EXTREMITY LIMITED SOFT TISSUE ULTRASOUND  TECHNIQUE: Ultrasound examination of the lower extremity soft tissues was performed in the area of clinical concern. COMPARISON:  CT abdomen and pelvis 03/28/2017 FINDINGS: Normal appearance of the left common femoral vein and left saphenofemoral junction. Left common femoral artery is patent. There is no evidence for a pseudoaneurysm. No large hematoma formation. IMPRESSION: Negative for pseudoaneurysm in the left groin.  No large hematoma. Electronically Signed   By: Markus Daft M.D.   On: 02/04/2022 16:44       Assessment & Plan:   Problem List Items Addressed This Visit     Anemia    Follow cbc.       Aortic atherosclerosis (HCC)    Continue crestor.        Carotid artery disease ([place])    Continue crestor and aspirin.  Continue risk factor modification.  Seeing cardiology.        Coronary artery disease of native artery of native heart with stable angina pectoris ([place])    Continue crestor and aspirin.  Continue risk factor modification.  Seeing cardiology.       Relevant Medications   escitalopram (LEXAPRO) 20 MG tablet   Crohn disease ([place])    Has been on lialda.  Bowels have been stable.  Follow.       Essential hypertension    On verapamil.  Follow pressures.  Follow metabolic panel.       GERD (gastroesophageal reflux disease)    No upper symptoms reported.  On protonix.       Health care maintenance    Physical today 10/28/22.  Mammogram 08/04/22- Bir[facility] I.  Colonoscopy 08/2018.        Hypercholesterolemia    Continue crestor.  Low cholesterol diet and exercise. Follow lipid panel and liver function tests.        Knee pain    Seeing ortho.  MRI as outlined.  S/p aspiration of joint fluid.  MRI - moderate joint effusion, sprain meniscal tear.  F/u ortho 09/21/22 - hinged knee brace.  PT.  F/u 11/2022.       Major depressive disorder, single episode, mild (HCC)    Overall appears to be doing well. On lexapro.  Follow.       Relevant Medications    escitalopram (LEXAPRO) 20 MG tablet   Stress    Appears to be handling things relatively well.  Continue lexapro.       Tremor    Right hand - noticed some tremor.  Follow to confirm labs wnl.  No focal neurological changes on exam.  Consider neurology evaluation if desires.  Follow.       Other Visit Diagnoses     Routine general medical examination at a health care facility    -  Primary  Einar Pheasant, MD

## 2022-11-08 ENCOUNTER — Encounter: Payer: Self-pay | Admitting: Internal Medicine

## 2022-11-08 DIAGNOSIS — M25569 Pain in unspecified knee: Secondary | ICD-10-CM | POA: Insufficient documentation

## 2022-11-08 DIAGNOSIS — M25562 Pain in left knee: Secondary | ICD-10-CM | POA: Insufficient documentation

## 2022-11-08 DIAGNOSIS — R251 Tremor, unspecified: Secondary | ICD-10-CM | POA: Insufficient documentation

## 2022-11-08 NOTE — Assessment & Plan Note (Signed)
Has been on lialda.  Bowels have been stable.  Follow.

## 2022-11-08 NOTE — Assessment & Plan Note (Signed)
Continue crestor and aspirin.  Continue risk factor modification.  Seeing cardiology.

## 2022-11-08 NOTE — Assessment & Plan Note (Signed)
Appears to be handling things relatively well.  Continue lexapro.

## 2022-11-08 NOTE — Assessment & Plan Note (Signed)
Seeing ortho.  MRI as outlined.  S/p aspiration of joint fluid.  MRI - moderate joint effusion, sprain meniscal tear.  F/u ortho 09/21/22 - hinged knee brace.  PT.  F/u 11/2022.

## 2022-11-08 NOTE — Assessment & Plan Note (Signed)
Overall appears to be doing well. On lexapro.  Follow.

## 2022-11-08 NOTE — Assessment & Plan Note (Signed)
No upper symptoms reported.  On protonix.

## 2022-11-08 NOTE — Assessment & Plan Note (Signed)
Right hand - noticed some tremor.  Follow to confirm labs wnl.  No focal neurological changes on exam.  Consider neurology evaluation if desires.  Follow.

## 2022-11-08 NOTE — Assessment & Plan Note (Signed)
Continue crestor 

## 2022-11-08 NOTE — Assessment & Plan Note (Signed)
On verapamil.  Follow pressures.  Follow metabolic panel.

## 2022-11-08 NOTE — Assessment & Plan Note (Signed)
Follow cbc.  

## 2022-11-08 NOTE — Assessment & Plan Note (Signed)
Continue crestor.  Low cholesterol diet and exercise. Follow lipid panel and liver function tests.

## 2022-11-16 DIAGNOSIS — M1711 Unilateral primary osteoarthritis, right knee: Secondary | ICD-10-CM | POA: Diagnosis not present

## 2022-11-16 DIAGNOSIS — S8001XA Contusion of right knee, initial encounter: Secondary | ICD-10-CM | POA: Diagnosis not present

## 2022-11-16 DIAGNOSIS — M25461 Effusion, right knee: Secondary | ICD-10-CM | POA: Diagnosis not present

## 2022-11-16 DIAGNOSIS — X500XXA Overexertion from strenuous movement or load, initial encounter: Secondary | ICD-10-CM | POA: Diagnosis not present

## 2022-11-30 ENCOUNTER — Telehealth: Payer: Self-pay | Admitting: Internal Medicine

## 2022-11-30 NOTE — Telephone Encounter (Signed)
Pt called in to speak with Dr. Nicki Reaper assistant concerning a STAT that needs to be done in [facility]. She said they wont see her Friday 01/19.she will like a call back.

## 2022-12-01 NOTE — Telephone Encounter (Signed)
Continue the warm compresses - multiple times per day.  Can massage the eyelid after warm compress.  Confirm no surrounding redness - around the eye.

## 2022-12-01 NOTE — Telephone Encounter (Signed)
Confirmed no redness around the eye. Patient is going to keep appt with [facility] for Friday and call to see if they have any cancellations.

## 2022-12-01 NOTE — Telephone Encounter (Signed)
Called patient to clarify what is needed. She has a stye in her left eye. Has been using warm compresses since Sunday with minimal relief. Patient called [facility] and they cannot see her until this coming Friday. Patient is wondering if there is some kind of ointment she can use OTC or prescription to help until Friday. Confirmed with patient no other acute symptoms.

## 2022-12-02 DIAGNOSIS — H0016 Chalazion left eye, unspecified eyelid: Secondary | ICD-10-CM | POA: Diagnosis not present

## 2022-12-08 DIAGNOSIS — H0016 Chalazion left eye, unspecified eyelid: Secondary | ICD-10-CM | POA: Diagnosis not present

## 2022-12-24 DIAGNOSIS — I6523 Occlusion and stenosis of bilateral carotid arteries: Secondary | ICD-10-CM | POA: Diagnosis not present

## 2022-12-24 DIAGNOSIS — E782 Mixed hyperlipidemia: Secondary | ICD-10-CM | POA: Diagnosis not present

## 2022-12-24 DIAGNOSIS — T82855A Stenosis of coronary artery stent, initial encounter: Secondary | ICD-10-CM | POA: Diagnosis not present

## 2022-12-24 DIAGNOSIS — I471 Supraventricular tachycardia, unspecified: Secondary | ICD-10-CM | POA: Diagnosis not present

## 2022-12-24 DIAGNOSIS — Z955 Presence of coronary angioplasty implant and graft: Secondary | ICD-10-CM | POA: Diagnosis not present

## 2022-12-24 DIAGNOSIS — I10 Essential (primary) hypertension: Secondary | ICD-10-CM | POA: Diagnosis not present

## 2022-12-24 DIAGNOSIS — I739 Peripheral vascular disease, unspecified: Secondary | ICD-10-CM | POA: Diagnosis not present

## 2022-12-24 DIAGNOSIS — I2510 Atherosclerotic heart disease of native coronary artery without angina pectoris: Secondary | ICD-10-CM | POA: Diagnosis not present

## 2022-12-24 DIAGNOSIS — I214 Non-ST elevation (NSTEMI) myocardial infarction: Secondary | ICD-10-CM | POA: Diagnosis not present

## 2022-12-26 DIAGNOSIS — Z7982 Long term (current) use of aspirin: Secondary | ICD-10-CM | POA: Diagnosis not present

## 2022-12-26 DIAGNOSIS — R110 Nausea: Secondary | ICD-10-CM | POA: Diagnosis not present

## 2022-12-26 DIAGNOSIS — R0789 Other chest pain: Secondary | ICD-10-CM | POA: Diagnosis not present

## 2022-12-26 DIAGNOSIS — E785 Hyperlipidemia, unspecified: Secondary | ICD-10-CM | POA: Diagnosis not present

## 2022-12-26 DIAGNOSIS — K5090 Crohn's disease, unspecified, without complications: Secondary | ICD-10-CM | POA: Diagnosis not present

## 2022-12-26 DIAGNOSIS — R000 Tachycardia, unspecified: Secondary | ICD-10-CM | POA: Diagnosis not present

## 2022-12-26 DIAGNOSIS — I2510 Atherosclerotic heart disease of native coronary artery without angina pectoris: Secondary | ICD-10-CM | POA: Diagnosis not present

## 2022-12-26 DIAGNOSIS — Z955 Presence of coronary angioplasty implant and graft: Secondary | ICD-10-CM | POA: Diagnosis not present

## 2022-12-26 DIAGNOSIS — I252 Old myocardial infarction: Secondary | ICD-10-CM | POA: Diagnosis not present

## 2022-12-26 DIAGNOSIS — R079 Chest pain, unspecified: Secondary | ICD-10-CM | POA: Diagnosis not present

## 2022-12-26 DIAGNOSIS — I10 Essential (primary) hypertension: Secondary | ICD-10-CM | POA: Diagnosis not present

## 2022-12-26 DIAGNOSIS — R002 Palpitations: Secondary | ICD-10-CM | POA: Diagnosis not present

## 2023-01-01 ENCOUNTER — Telehealth (INDEPENDENT_AMBULATORY_CARE_PROVIDER_SITE_OTHER): Payer: Self-pay | Admitting: Nurse Practitioner

## 2023-01-01 NOTE — Telephone Encounter (Signed)
LVM for pt TCB and schedule appt  bilateral SALINE sclero Josem Kaufmann [id number] exp: 2.13.24 - 5.10.24 - 6 units total  - 3 appts with Eulogio Ditch, NP  only

## 2023-01-14 ENCOUNTER — Encounter (INDEPENDENT_AMBULATORY_CARE_PROVIDER_SITE_OTHER): Payer: Self-pay | Admitting: Nurse Practitioner

## 2023-01-14 ENCOUNTER — Ambulatory Visit (INDEPENDENT_AMBULATORY_CARE_PROVIDER_SITE_OTHER): Payer: Medicare PPO | Admitting: Nurse Practitioner

## 2023-01-14 VITALS — BP 146/72 | HR 72 | Resp 18

## 2023-01-14 DIAGNOSIS — I83813 Varicose veins of bilateral lower extremities with pain: Secondary | ICD-10-CM | POA: Diagnosis not present

## 2023-01-14 NOTE — Progress Notes (Signed)
Varicose veins of bilateral lower extremity with inflammation (454.1  I83.10) Current Plans   Indication: Patient presents with symptomatic varicose veins of the bilateral  lower extremity.   Procedure: Sclerotherapy using hypertonic saline mixed with 1% Lidocaine was performed on the bilateral lower extremity. Compression wraps were placed. The patient tolerated the procedure well. 

## 2023-01-15 DIAGNOSIS — Z961 Presence of intraocular lens: Secondary | ICD-10-CM | POA: Diagnosis not present

## 2023-01-15 DIAGNOSIS — H0100A Unspecified blepharitis right eye, upper and lower eyelids: Secondary | ICD-10-CM | POA: Diagnosis not present

## 2023-01-15 DIAGNOSIS — D3131 Benign neoplasm of right choroid: Secondary | ICD-10-CM | POA: Diagnosis not present

## 2023-01-18 ENCOUNTER — Other Ambulatory Visit: Payer: Self-pay | Admitting: Internal Medicine

## 2023-01-29 ENCOUNTER — Telehealth (INDEPENDENT_AMBULATORY_CARE_PROVIDER_SITE_OTHER): Payer: Self-pay | Admitting: Nurse Practitioner

## 2023-01-29 NOTE — Telephone Encounter (Signed)
LVM for pt TCB and schedule appt  bilateral SALINE sclero Josem Kaufmann [id number] exp: 2.13.24 - 5.10.24 - 6 units total - 3 appts with Eulogio Ditch, NP only (as of 3.22.24 - 2 more appts left to schedule)

## 2023-02-16 ENCOUNTER — Ambulatory Visit (INDEPENDENT_AMBULATORY_CARE_PROVIDER_SITE_OTHER): Payer: Medicare PPO | Admitting: Nurse Practitioner

## 2023-02-16 ENCOUNTER — Encounter (INDEPENDENT_AMBULATORY_CARE_PROVIDER_SITE_OTHER): Payer: Self-pay | Admitting: Nurse Practitioner

## 2023-02-16 VITALS — BP 139/69 | HR 76 | Resp 18

## 2023-02-16 DIAGNOSIS — I83813 Varicose veins of bilateral lower extremities with pain: Secondary | ICD-10-CM

## 2023-02-16 NOTE — Progress Notes (Signed)
Varicose veins of bilateral  lower extremity with inflammation (454.1  I83.10) Current Plans   Indication: Patient presents with symptomatic varicose veins of the bilateral  lower extremity.   Procedure: Sclerotherapy using hypertonic saline mixed with 1% Lidocaine was performed on the bilateral lower extremity. Compression wraps were placed. The patient tolerated the procedure well. 

## 2023-02-18 ENCOUNTER — Telehealth: Payer: Self-pay | Admitting: Internal Medicine

## 2023-02-18 DIAGNOSIS — E7800 Pure hypercholesterolemia, unspecified: Secondary | ICD-10-CM

## 2023-02-18 NOTE — Telephone Encounter (Signed)
Patient has a lab appointment 01/26/2023, there are no orders in.

## 2023-02-19 NOTE — Telephone Encounter (Signed)
Orders placed for f/u labs.  

## 2023-02-19 NOTE — Addendum Note (Signed)
Addended by: Charm Barges on: 02/19/2023 04:18 AM   Modules accepted: Orders

## 2023-02-26 ENCOUNTER — Other Ambulatory Visit (INDEPENDENT_AMBULATORY_CARE_PROVIDER_SITE_OTHER): Payer: Medicare PPO

## 2023-02-26 DIAGNOSIS — E7800 Pure hypercholesterolemia, unspecified: Secondary | ICD-10-CM | POA: Diagnosis not present

## 2023-02-26 LAB — BASIC METABOLIC PANEL
BUN: 16 mg/dL (ref 6–23)
CO2: 27 mEq/L (ref 19–32)
Calcium: 9.2 mg/dL (ref 8.4–10.5)
Chloride: 106 mEq/L (ref 96–112)
Creatinine, Ser: 0.9 mg/dL (ref 0.40–1.20)
GFR: 65.91 mL/min (ref >60.00)
Glucose, Bld: 97 mg/dL (ref 70–99)
Potassium: 3.9 mEq/L (ref 3.5–5.1)
Sodium: 140 mEq/L (ref 135–145)

## 2023-02-26 LAB — LIPID PANEL
Cholesterol: 148 mg/dL (ref 0–200)
HDL: 50.8 mg/dL (ref >39.00)
LDL Cholesterol: 76 mg/dL (ref 0–99)
NonHDL: 97.63
Total CHOL/HDL Ratio: 3
Triglycerides: 108 mg/dL (ref 0.0–149.0)
VLDL: 21.6 mg/dL (ref 0.0–40.0)

## 2023-02-26 LAB — HEPATIC FUNCTION PANEL
ALT: 19 U/L (ref 0–35)
AST: 18 U/L (ref 0–37)
Albumin: 4.1 g/dL (ref 3.5–5.2)
Alkaline Phosphatase: 84 U/L (ref 39–117)
Bilirubin, Direct: 0.1 mg/dL (ref 0.0–0.3)
Total Bilirubin: 0.6 mg/dL (ref 0.2–1.2)
Total Protein: 6.4 g/dL (ref 6.0–8.3)

## 2023-03-02 ENCOUNTER — Ambulatory Visit: Payer: Medicare PPO | Admitting: Internal Medicine

## 2023-03-04 ENCOUNTER — Ambulatory Visit: Payer: Medicare PPO | Admitting: Internal Medicine

## 2023-03-04 ENCOUNTER — Encounter: Payer: Self-pay | Admitting: Internal Medicine

## 2023-03-04 VITALS — BP 122/70 | HR 67 | Temp 97.9°F | Ht 62.0 in | Wt 163.8 lb

## 2023-03-04 DIAGNOSIS — I779 Disorder of arteries and arterioles, unspecified: Secondary | ICD-10-CM

## 2023-03-04 DIAGNOSIS — E7800 Pure hypercholesterolemia, unspecified: Secondary | ICD-10-CM

## 2023-03-04 DIAGNOSIS — F320 Major depressive disorder, single episode, mild: Secondary | ICD-10-CM | POA: Diagnosis not present

## 2023-03-04 DIAGNOSIS — I10 Essential (primary) hypertension: Secondary | ICD-10-CM | POA: Diagnosis not present

## 2023-03-04 DIAGNOSIS — I700 Atherosclerosis of aorta: Secondary | ICD-10-CM | POA: Diagnosis not present

## 2023-03-04 DIAGNOSIS — I83813 Varicose veins of bilateral lower extremities with pain: Secondary | ICD-10-CM

## 2023-03-04 DIAGNOSIS — I25118 Atherosclerotic heart disease of native coronary artery with other forms of angina pectoris: Secondary | ICD-10-CM | POA: Diagnosis not present

## 2023-03-04 DIAGNOSIS — K219 Gastro-esophageal reflux disease without esophagitis: Secondary | ICD-10-CM

## 2023-03-04 DIAGNOSIS — D509 Iron deficiency anemia, unspecified: Secondary | ICD-10-CM

## 2023-03-04 DIAGNOSIS — K50919 Crohn's disease, unspecified, with unspecified complications: Secondary | ICD-10-CM | POA: Diagnosis not present

## 2023-03-04 MED ORDER — ROSUVASTATIN CALCIUM 5 MG PO TABS: ORAL_TABLET | ORAL | 1 refills | Status: DC

## 2023-03-04 NOTE — Progress Notes (Signed)
Subjective:    Patient ID: Morgan Moore, female    DOB: 1955-12-23, 67 y.o.   MRN: [id number]  Patient here for  Chief Complaint  Patient presents with   Medical Management of Chronic Issues    HPI Here to follow up regarding her blood pressure, CAD and cholesterol. Was seen in ER 12/26/22 - increased heart rate and elevated blood pressure.  W/up unrevealing. Had seen Dr Juliann Pares two days prior.  No changes made.  Takes verapamil - to control increased heart rate.  Reports some DOE - stable.  States cardiology aware.  Desires no further intervention or testing at this time.  Wants to monitor.  Planning f/u with GI 05/2023.  Does describe - "bones hurt" - hips and shoulders.  Discussed crestor and recent labs.  Agreeable to increase to three times a week crestor.     Past Medical History:  Diagnosis Date   Allergy    Anemia    Anxiety    Chicken pox    Complication of anesthesia    vomitting   Coronary artery disease    Crohn disease (HCC)    DVT (deep venous thrombosis) (HCC) 2017   Dyspnea    on excertion   Dysrhythmia 2019   palpitations   GERD (gastroesophageal reflux disease)    Heart disease    Heart murmur    Heart murmur    History of kidney stones    Hyperlipidemia    Hypertension    Inflammatory bowel disease    Migraines    hormonal, puberty   Myocardial infarction (HCC)    Phlebitis    PONV (postoperative nausea and vomiting)    Past Surgical History:  Procedure Laterality Date   BREAST BIOPSY Left 07/05/2019   FIBROEPITHELIAL lesion/neg   BREAST CYST ASPIRATION     unsure of side   CARPAL TUNNEL RELEASE Right 12/02/2017   Procedure: CARPAL TUNNEL RELEASE ENDOSCOPIC;  Surgeon: Christena Flake, MD;  Location: [facility] ORS;  Service: Orthopedics;  Laterality: Right;   CORONARY ANGIOPLASTY WITH STENT PLACEMENT  2017   x 2   EYE SURGERY Bilateral    cataract surgery   GANGLION CYST EXCISION Right 12/02/2017   Procedure: REMOVAL GANGLION OF WRIST;   Surgeon: Christena Flake, MD;  Location: [facility] ORS;  Service: Orthopedics;  Laterality: Right;   heart murmur     HYSTEROSCOPY WITH D & C N/A 04/19/2020   Procedure: DILATATION AND CURETTAGE /HYSTEROSCOPY, POSSIBLE  POLYPECTOMY;  Surgeon: Christeen Douglas, MD;  Location: [facility] ORS;  Service: Gynecology;  Laterality: N/A;   TONSILLECTOMY AND ADENOIDECTOMY  1962   TUBAL LIGATION  1993   tubaligation  1990   Family History  Problem Relation Age of Onset   Heart disease Father    Heart disease Brother    Cancer Maternal Aunt    Breast cancer Maternal Aunt    Stroke Mother    Kidney disease Neg Hx    GU problems Neg Hx    Kidney cancer Neg Hx    Social History   Socioeconomic History   Marital status: Single    Spouse name: Not on file   Number of children: Not on file   Years of education: Not on file   Highest education level: Not on file  Occupational History   Not on file  Tobacco Use   Smoking status: Never   Smokeless tobacco: Never  Vaping Use   Vaping Use: Never used  Substance and Sexual  Activity   Alcohol use: No    Alcohol/week: 0.0 standard drinks of alcohol   Drug use: No   Sexual activity: Not on file  Other Topics Concern   Not on file  Social History Narrative   Had 2 kids. Daughter died in 11/19/11 son living    Former Writer    Social Determinants of Health   Financial Resource Strain: Low Risk  (04/10/2022)   Overall Financial Resource Strain (CARDIA)    Difficulty of Paying Living Expenses: Not hard at all  Food Insecurity: No Food Insecurity (04/10/2022)   Hunger Vital Sign    Worried About Running Out of Food in the Last Year: Never true    Ran Out of Food in the Last Year: Never true  Transportation Needs: No Transportation Needs (04/10/2022)   PRAPARE - Administrator, Civil Service (Medical): No    Lack of Transportation (Non-Medical): No  Physical Activity: Sufficiently Active (04/10/2022)   Exercise Vital Sign    Days of Exercise  per Week: 5 days    Minutes of Exercise per Session: 30 min  Stress: No Stress Concern Present (04/10/2022)   [employer] of Occupational Health - Occupational Stress Questionnaire    Feeling of Stress : Not at all  Social Connections: Unknown (04/10/2022)   Social Connection and Isolation Panel [NHANES]    Frequency of Communication with Friends and Family: More than three times a week    Frequency of Social Gatherings with Friends and Family: More than three times a week    Attends Religious Services: More than 4 times per year    Active Member of [employer] or Organizations: Yes    Attends Engineer, structural: More than 4 times per year    Marital Status: Not on file     Review of Systems  Constitutional:  Negative for appetite change and unexpected weight change.  HENT:  Negative for congestion and sinus pressure.   Respiratory:  Negative for cough and chest tightness.        Feels breathing overall stable.   Cardiovascular:  Negative for leg swelling.       Reports DOE - stable.  Some increased heart rate as outlined.   Gastrointestinal:  Negative for abdominal pain, diarrhea, nausea and vomiting.  Genitourinary:  Negative for difficulty urinating and dysuria.  Musculoskeletal:  Negative for joint swelling and myalgias.  Neurological:  Negative for dizziness and headaches.  Psychiatric/Behavioral:  Negative for agitation and dysphoric mood.        Objective:     BP 122/70   Pulse 67   Temp 97.9 F (36.6 C) (Oral)   Ht 5\' 2"  (1.575 m)   Wt 163 lb 12.8 oz (74.3 kg)   SpO2 95%   BMI 29.96 kg/m  Wt Readings from Last 3 Encounters:  03/04/23 163 lb 12.8 oz (74.3 kg)  10/28/22 160 lb (72.6 kg)  10/08/22 163 lb 3.2 oz (74 kg)    Physical Exam Vitals reviewed.  Constitutional:      General: She is not in acute distress.    Appearance: Normal appearance.  HENT:     Head: Normocephalic and atraumatic.     Right Ear: External ear normal.     Left Ear:  External ear normal.  Eyes:     General: No scleral icterus.       Right eye: No discharge.        Left eye: No discharge.  Conjunctiva/sclera: Conjunctivae normal.  Neck:     Thyroid: No thyromegaly.  Cardiovascular:     Rate and Rhythm: Normal rate and regular rhythm.  Pulmonary:     Effort: No respiratory distress.     Breath sounds: Normal breath sounds. No wheezing.  Abdominal:     General: Bowel sounds are normal.     Palpations: Abdomen is soft.     Tenderness: There is no abdominal tenderness.  Musculoskeletal:        General: No swelling or tenderness.     Cervical back: Neck supple. No tenderness.     Comments: Left foot bunion  Lymphadenopathy:     Cervical: No cervical adenopathy.  Skin:    Findings: No erythema or rash.  Neurological:     Mental Status: She is alert.  Psychiatric:        Mood and Affect: Mood normal.        Behavior: Behavior normal.      Outpatient Encounter Medications as of 03/04/2023  Medication Sig   aspirin EC 81 MG tablet Take 81 mg by mouth daily.   Cholecalciferol 25 MCG (1000 UT) tablet Take 1,000 Units by mouth 2 (two) times daily.   clopidogrel (PLAVIX) 75 MG tablet Take 75 mg by mouth daily.   cyanocobalamin 1000 MCG tablet Take 1,000 mcg by mouth daily at 12 noon.    diphenhydrAMINE (BENADRYL) 12.5 MG/5ML elixir Take 12.5 mg by mouth every 6 (six) hours as needed (allergic reaction).   EPINEPHrine 0.3 mg/0.3 mL IJ SOAJ injection Inject 0.3 mg into the muscle once.    erythromycin ophthalmic ointment 1 Application.   escitalopram (LEXAPRO) 20 MG tablet Take 1 tablet (20 mg total) by mouth daily.   Fe Fum-FePoly-Vit C-Vit B3 (INTEGRA) 62.5-62.5-40-3 MG CAPS TAKE ONE CAPSULE BY MOUTH three times WEEKLY.   Melatonin-Pyridoxine 3-10 MG TABS Take 1 tablet by mouth at bedtime.   mesalamine (LIALDA) 1.2 g EC tablet Take 1.2 g by mouth 3 (three) times daily with meals.    mupirocin ointment (BACTROBAN) 2 % Apply to affected area bid    nitroGLYCERIN (NITROSTAT) 0.4 MG SL tablet DISSOLVE (1) TABLET UNDER TONGUE AS NEEDED TO RELIEVE CHEST PAIN. MAYREPEAT EVERY 5 MINUTES.   ondansetron (ZOFRAN-ODT) 4 MG disintegrating tablet Take 1 tablet (4 mg total) by mouth daily as needed for nausea.   pantoprazole (PROTONIX) 20 MG tablet Take 1 tablet by mouth daily.   prednisoLONE acetate (PRED FORTE) 1 % ophthalmic suspension Place 1 drop into both eyes 2 (two) times daily as needed (for inflammation).   REPATHA 140 MG/ML SOSY Inject into the skin.   rosuvastatin (CRESTOR) 5 MG tablet Take 1 tablet 3x/week   verapamil (CALAN-SR) 120 MG CR tablet Take 120 mg by mouth every evening.    [DISCONTINUED] rosuvastatin (CRESTOR) 5 MG tablet TAKE ONE TABLET BY MOUTH TWO DAYS WEEKLY.   No facility-administered encounter medications on file as of 03/04/2023.     Lab Results  Component Value Date   WBC 7.9 10/26/2022   HGB 13.4 10/26/2022   HCT 39.3 10/26/2022   PLT 320.0 10/26/2022   GLUCOSE 97 02/26/2023   CHOL 148 02/26/2023   TRIG 108.0 02/26/2023   HDL 50.80 02/26/2023   LDLCALC 76 02/26/2023   ALT 19 02/26/2023   AST 18 02/26/2023   NA 140 02/26/2023   K 3.9 02/26/2023   CL 106 02/26/2023   CREATININE 0.90 02/26/2023   BUN 16 02/26/2023   CO2 27 02/26/2023  TSH 2.93 06/04/2022   INR 1.0 12/23/2017    MM 3D SCREEN BREAST BILATERAL  Result Date: 08/05/2022 CLINICAL DATA:  Screening. EXAM: DIGITAL SCREENING BILATERAL MAMMOGRAM WITH TOMOSYNTHESIS AND CAD TECHNIQUE: Bilateral screening digital craniocaudal and mediolateral oblique mammograms were obtained. Bilateral screening digital breast tomosynthesis was performed. The images were evaluated with computer-aided detection. COMPARISON:  Previous exam(s). ACR Breast Density Category c: The breast tissue is heterogeneously dense, which may obscure small masses. FINDINGS: There are no findings suspicious for malignancy. IMPRESSION: No mammographic evidence of malignancy. A result  letter of this screening mammogram will be mailed directly to the patient. RECOMMENDATION: Screening mammogram in one year. (Code:SM-B-01Y) BI-RADS CATEGORY  1: Negative. Electronically Signed   By: Edwin Cap M.D.   On: 08/05/2022 09:52       Assessment & Plan:  Hypercholesterolemia Assessment & Plan: Continue crestor.  Discussed concerns regarding msk pain.  Discussed recent labs.  Agreeable to increase crestor to 3 days/week. Low cholesterol diet and exercise. Follow lipid panel and liver function tests.  Follow symptoms. Call with update.    Iron deficiency anemia, unspecified iron deficiency anemia type Assessment & Plan: Follow cbc.    Aortic atherosclerosis (HCC) Assessment & Plan: Continue crestor.     Carotid artery disease, unspecified laterality, unspecified type [facility]) Assessment & Plan: Continue crestor and aspirin.  Continue risk factor modification.  Saw Dr Gilda Crease 04/2022 - Given the patient's asymptomatic subcritical stenosis no further invasive testing or surgery at this time. Duplex ultrasound shows 1-39% stenosis bilaterally. Continue antiplatelet therapy as prescribed Continue management of CAD, HTN and Hyperlipidemia. Follow up in 12 months with duplex ultrasound and physical exam    Coronary artery disease of native artery of native heart with stable angina pectoris [facility]) Assessment & Plan: Continue crestor and aspirin.  Continues verapamil as outlined.  Continue risk factor modification.  Seeing cardiology.    Crohn's disease with complication, unspecified gastrointestinal tract location [facility]) Assessment & Plan: Has been on lialda.  Bowels have been relatively stable.  Follow.  Has f/u with GI 05/2023.     Essential hypertension Assessment & Plan: On verapamil.  Follow pressures.  Follow metabolic panel.    Gastroesophageal reflux disease, unspecified whether esophagitis present Assessment & Plan: No upper symptoms reported.  On protonix.     Major depressive disorder, single episode, mild (HCC) Assessment & Plan: Overall appears to be doing well. On lexapro.  Follow.    Varicose veins of both lower extremities with pain Assessment & Plan: Seeing [facility].  Sclertherapy.    Other orders -     Rosuvastatin Calcium; Take 1 tablet 3x/week  Dispense: 39 tablet; Refill: 1     Dale [place], MD

## 2023-03-06 ENCOUNTER — Encounter: Payer: Self-pay | Admitting: Internal Medicine

## 2023-03-07 NOTE — Assessment & Plan Note (Signed)
Follow cbc.  

## 2023-03-07 NOTE — Assessment & Plan Note (Signed)
Continue crestor and aspirin.  Continues verapamil as outlined.  Continue risk factor modification.  Seeing cardiology.

## 2023-03-07 NOTE — Assessment & Plan Note (Signed)
On verapamil.  Follow pressures.  Follow metabolic panel.  

## 2023-03-07 NOTE — Assessment & Plan Note (Signed)
No upper symptoms reported.  On protonix.   

## 2023-03-07 NOTE — Assessment & Plan Note (Signed)
Continue crestor 

## 2023-03-07 NOTE — Assessment & Plan Note (Signed)
Overall appears to be doing well. On lexapro.  Follow.  

## 2023-03-07 NOTE — Assessment & Plan Note (Signed)
Has been on lialda.  Bowels have been relatively stable.  Follow.  Has f/u with GI 05/2023.

## 2023-03-07 NOTE — Assessment & Plan Note (Signed)
Seeing [facility].  Sclertherapy.

## 2023-03-07 NOTE — Assessment & Plan Note (Addendum)
Continue crestor and aspirin.  Continue risk factor modification.  Saw Dr Gilda Crease 04/2022 - Given the patient's asymptomatic subcritical stenosis no further invasive testing or surgery at this time. Duplex ultrasound shows 1-39% stenosis bilaterally. Continue antiplatelet therapy as prescribed Continue management of CAD, HTN and Hyperlipidemia. Follow up in 12 months with duplex ultrasound and physical exam

## 2023-03-07 NOTE — Assessment & Plan Note (Signed)
Continue crestor.  Discussed concerns regarding msk pain.  Discussed recent labs.  Agreeable to increase crestor to 3 days/week. Low cholesterol diet and exercise. Follow lipid panel and liver function tests.  Follow symptoms. Call with update.

## 2023-03-16 ENCOUNTER — Ambulatory Visit (INDEPENDENT_AMBULATORY_CARE_PROVIDER_SITE_OTHER): Payer: Medicare PPO | Admitting: Nurse Practitioner

## 2023-03-16 ENCOUNTER — Encounter (INDEPENDENT_AMBULATORY_CARE_PROVIDER_SITE_OTHER): Payer: Self-pay | Admitting: Nurse Practitioner

## 2023-03-16 VITALS — BP 136/70 | HR 75 | Resp 16 | Wt 164.0 lb

## 2023-03-16 DIAGNOSIS — I83813 Varicose veins of bilateral lower extremities with pain: Secondary | ICD-10-CM

## 2023-03-16 NOTE — Progress Notes (Signed)
Varicose veins of bilateral  lower extremity with inflammation (454.1  I83.10) Current Plans   Indication: Patient presents with symptomatic varicose veins of the bilateral  lower extremity.   Procedure: Sclerotherapy using hypertonic saline mixed with 1% Lidocaine was performed on the bilateral lower extremity. Compression wraps were placed. The patient tolerated the procedure well. 

## 2023-03-26 DIAGNOSIS — L570 Actinic keratosis: Secondary | ICD-10-CM | POA: Diagnosis not present

## 2023-03-26 DIAGNOSIS — L578 Other skin changes due to chronic exposure to nonionizing radiation: Secondary | ICD-10-CM | POA: Diagnosis not present

## 2023-03-26 DIAGNOSIS — Z872 Personal history of diseases of the skin and subcutaneous tissue: Secondary | ICD-10-CM | POA: Diagnosis not present

## 2023-03-26 DIAGNOSIS — L739 Follicular disorder, unspecified: Secondary | ICD-10-CM | POA: Diagnosis not present

## 2023-03-26 DIAGNOSIS — D225 Melanocytic nevi of trunk: Secondary | ICD-10-CM | POA: Diagnosis not present

## 2023-04-08 ENCOUNTER — Encounter (INDEPENDENT_AMBULATORY_CARE_PROVIDER_SITE_OTHER): Payer: Self-pay | Admitting: Vascular Surgery

## 2023-04-08 ENCOUNTER — Ambulatory Visit (INDEPENDENT_AMBULATORY_CARE_PROVIDER_SITE_OTHER): Payer: Medicare PPO | Admitting: Vascular Surgery

## 2023-04-08 ENCOUNTER — Ambulatory Visit (INDEPENDENT_AMBULATORY_CARE_PROVIDER_SITE_OTHER): Payer: Medicare PPO

## 2023-04-08 VITALS — BP 122/80 | HR 64 | Resp 18 | Ht 62.0 in | Wt 164.0 lb

## 2023-04-08 DIAGNOSIS — I779 Disorder of arteries and arterioles, unspecified: Secondary | ICD-10-CM

## 2023-04-19 ENCOUNTER — Ambulatory Visit (INDEPENDENT_AMBULATORY_CARE_PROVIDER_SITE_OTHER): Payer: Medicare PPO

## 2023-04-19 VITALS — Ht 62.0 in | Wt 164.0 lb

## 2023-04-19 DIAGNOSIS — Z0000 Encounter for general adult medical examination without abnormal findings: Secondary | ICD-10-CM

## 2023-04-19 NOTE — Patient Instructions (Signed)
[name] , Thank you for taking time to come for your Medicare Wellness Visit. I appreciate your ongoing commitment to your health goals. Please review the following plan we discussed and let me know if I can assist you in the future.   These are the goals we discussed:  Goals       DIET - EAT MORE FRUITS AND VEGETABLES      Follow up with Primary Care Provider (pt-stated)      Monitor blood pressure  Stay hydrated        This is a list of the screening recommended for you and due dates:  Health Maintenance  Topic Date Due   Pneumonia Vaccine (1 of 1 - PCV) 10/29/2023*   Flu Shot  06/10/2023   Mammogram  08/05/2023   Medicare Annual Wellness Visit  04/18/2024   Colon Cancer Screening  10/19/2029   DEXA scan (bone density measurement)  Completed   Hepatitis C Screening  Completed   HPV Vaccine  Aged Out   DTaP/Tdap/Td vaccine  Discontinued   COVID-19 Vaccine  Discontinued   Zoster (Shingles) Vaccine  Discontinued  *Topic was postponed. The date shown is not the original due date.    Advanced directives: no  Conditions/risks identified: none  Next appointment: Follow up in one year for your annual wellness visit 04/19/24 @ 8:15 am by phone   Preventive Care 65 Years and Older, Female Preventive care refers to lifestyle choices and visits with your health care provider that can promote health and wellness. What does preventive care include? A yearly physical exam. This is also called an annual well check. Dental exams once or twice a year. Routine eye exams. Ask your health care provider how often you should have your eyes checked. Personal lifestyle choices, including: Daily care of your teeth and gums. Regular physical activity. Eating a healthy diet. Avoiding tobacco and drug use. Limiting alcohol use. Practicing safe sex. Taking low-dose aspirin every day. Taking vitamin and mineral supplements as recommended by your health care provider. What happens during  an annual well check? The services and screenings done by your health care provider during your annual well check will depend on your age, overall health, lifestyle risk factors, and family history of disease. Counseling  Your health care provider may ask you questions about your: Alcohol use. Tobacco use. Drug use. Emotional well-being. Home and relationship well-being. Sexual activity. Eating habits. History of falls. Memory and ability to understand (cognition). Work and work Astronomer. Reproductive health. Screening  You may have the following tests or measurements: Height, weight, and BMI. Blood pressure. Lipid and cholesterol levels. These may be checked every 5 years, or more frequently if you are over 37 years old. Skin check. Lung cancer screening. You may have this screening every year starting at age 19 if you have a 30-pack-year history of smoking and currently smoke or have quit within the past 15 years. Fecal occult blood test (FOBT) of the stool. You may have this test every year starting at age 57. Flexible sigmoidoscopy or colonoscopy. You may have a sigmoidoscopy every 5 years or a colonoscopy every 10 years starting at age 64. Hepatitis C blood test. Hepatitis B blood test. Sexually transmitted disease (STD) testing. Diabetes screening. This is done by checking your blood sugar (glucose) after you have not eaten for a while (fasting). You may have this done every 1-3 years. Bone density scan. This is done to screen for osteoporosis. You may have this done  starting at age 46. Mammogram. This may be done every 1-2 years. Talk to your health care provider about how often you should have regular mammograms. Talk with your health care provider about your test results, treatment options, and if necessary, the need for more tests. Vaccines  Your health care provider may recommend certain vaccines, such as: Influenza vaccine. This is recommended every year. Tetanus,  diphtheria, and acellular pertussis (Tdap, Td) vaccine. You may need a Td booster every 10 years. Zoster vaccine. You may need this after age 39. Pneumococcal 13-valent conjugate (PCV13) vaccine. One dose is recommended after age 25. Pneumococcal polysaccharide (PPSV23) vaccine. One dose is recommended after age 69. Talk to your health care provider about which screenings and vaccines you need and how often you need them. This information is not intended to replace advice given to you by your health care provider. Make sure you discuss any questions you have with your health care provider. Document Released: 11/22/2015 Document Revised: 07/15/2016 Document Reviewed: 08/27/2015 Elsevier Interactive Patient Education  2017 [employer].  Fall Prevention in the Home Falls can cause injuries. They can happen to people of all ages. There are many things you can do to make your home safe and to help prevent falls. What can I do on the outside of my home? Regularly fix the edges of walkways and driveways and fix any cracks. Remove anything that might make you trip as you walk through a door, such as a raised step or threshold. Trim any bushes or trees on the path to your home. Use bright outdoor lighting. Clear any walking paths of anything that might make someone trip, such as rocks or tools. Regularly check to see if handrails are loose or broken. Make sure that both sides of any steps have handrails. Any raised decks and porches should have guardrails on the edges. Have any leaves, snow, or ice cleared regularly. Use sand or salt on walking paths during winter. Clean up any spills in your garage right away. This includes oil or grease spills. What can I do in the bathroom? Use night lights. Install grab bars by the toilet and in the tub and shower. Do not use towel bars as grab bars. Use non-skid mats or decals in the tub or shower. If you need to sit down in the shower, use a plastic,  non-slip stool. Keep the floor dry. Clean up any water that spills on the floor as soon as it happens. Remove soap buildup in the tub or shower regularly. Attach bath mats securely with double-sided non-slip rug tape. Do not have throw rugs and other things on the floor that can make you trip. What can I do in the bedroom? Use night lights. Make sure that you have a light by your bed that is easy to reach. Do not use any sheets or blankets that are too big for your bed. They should not hang down onto the floor. Have a firm chair that has side arms. You can use this for support while you get dressed. Do not have throw rugs and other things on the floor that can make you trip. What can I do in the kitchen? Clean up any spills right away. Avoid walking on wet floors. Keep items that you use a lot in easy-to-reach places. If you need to reach something above you, use a strong step stool that has a grab bar. Keep electrical cords out of the way. Do not use floor polish or wax that  makes floors slippery. If you must use wax, use non-skid floor wax. Do not have throw rugs and other things on the floor that can make you trip. What can I do with my stairs? Do not leave any items on the stairs. Make sure that there are handrails on both sides of the stairs and use them. Fix handrails that are broken or loose. Make sure that handrails are as long as the stairways. Check any carpeting to make sure that it is firmly attached to the stairs. Fix any carpet that is loose or worn. Avoid having throw rugs at the top or bottom of the stairs. If you do have throw rugs, attach them to the floor with carpet tape. Make sure that you have a light switch at the top of the stairs and the bottom of the stairs. If you do not have them, ask someone to add them for you. What else can I do to help prevent falls? Wear shoes that: Do not have high heels. Have rubber bottoms. Are comfortable and fit you well. Are closed  at the toe. Do not wear sandals. If you use a stepladder: Make sure that it is fully opened. Do not climb a closed stepladder. Make sure that both sides of the stepladder are locked into place. Ask someone to hold it for you, if possible. Clearly mark and make sure that you can see: Any grab bars or handrails. First and last steps. Where the edge of each step is. Use tools that help you move around (mobility aids) if they are needed. These include: Canes. Walkers. Scooters. Crutches. Turn on the lights when you go into a dark area. Replace any light bulbs as soon as they burn out. Set up your furniture so you have a clear path. Avoid moving your furniture around. If any of your floors are uneven, fix them. If there are any pets around you, be aware of where they are. Review your medicines with your doctor. Some medicines can make you feel dizzy. This can increase your chance of falling. Ask your doctor what other things that you can do to help prevent falls. This information is not intended to replace advice given to you by your health care provider. Make sure you discuss any questions you have with your health care provider. Document Released: 08/22/2009 Document Revised: 04/02/2016 Document Reviewed: 11/30/2014 Elsevier Interactive Patient Education  2017 [employer].

## 2023-04-19 NOTE — Progress Notes (Signed)
I connected with  Morgan Moore on 04/19/23 by a audio enabled telemedicine application and verified that I am speaking with the correct person using two identifiers.  Patient Location: Home  Provider Location: Office/Clinic  I discussed the limitations of evaluation and management by telemedicine. The patient expressed understanding and agreed to proceed.  Subjective:   Morgan Moore is a 67 y.o. female who presents for Medicare Annual (Subsequent) preventive examination.  Review of Systems     Cardiac Risk Factors include: advanced age (>60men, >64 women);hypertension     Objective:    Today's Vitals   04/19/23 0920  Weight: 164 lb (74.4 kg)  Height: 5\' 2"  (1.575 m)   Body mass index is 30 kg/m.     04/19/2023    9:04 AM 04/10/2022   10:44 AM 06/02/2021    2:21 PM 04/08/2021    8:36 AM 04/01/2021   10:22 PM 04/19/2020    6:24 AM 04/09/2020    9:09 AM  Advanced Directives  Does Patient Have a Medical Advance Directive? No Yes Yes Yes No No Yes  Type of Special educational needs teacher of [place];Living will Healthcare Power of [employer] Power of [employer] and [employer] Power of Attorney  Does patient want to make changes to medical advance directive?  No - Patient declined No - Patient declined No - Patient declined   No - Patient declined  Copy of Healthcare Power of Attorney in Chart?  No - copy requested  No - copy requested   No - copy requested  Would patient like information on creating a medical advance directive? No - Patient declined     No - Patient declined No - Patient declined    Current Medications (verified) Outpatient Encounter Medications as of 04/19/2023  Medication Sig   aspirin EC 81 MG tablet Take 81 mg by mouth daily.   Cholecalciferol 25 MCG (1000 UT) tablet Take 1,000 Units by mouth 2 (two) times daily.   clopidogrel (PLAVIX) 75 MG tablet Take 75 mg by mouth daily.   cyanocobalamin 1000 MCG tablet Take 1,000 mcg by mouth  daily at 12 noon.    diphenhydrAMINE (BENADRYL) 12.5 MG/5ML elixir Take 12.5 mg by mouth every 6 (six) hours as needed (allergic reaction).   EPINEPHrine 0.3 mg/0.3 mL IJ SOAJ injection Inject 0.3 mg into the muscle once.    escitalopram (LEXAPRO) 20 MG tablet Take 1 tablet (20 mg total) by mouth daily.   Fe Fum-FePoly-Vit C-Vit B3 (INTEGRA) 62.5-62.5-40-3 MG CAPS TAKE ONE CAPSULE BY MOUTH three times WEEKLY.   fluocinonide (LIDEX) 0.05 % external solution Apply 1 Application topically 2 (two) times daily.   Melatonin-Pyridoxine 3-10 MG TABS Take 1 tablet by mouth at bedtime.   mesalamine (LIALDA) 1.2 g EC tablet Take 1.2 g by mouth 3 (three) times daily with meals.    mupirocin ointment (BACTROBAN) 2 % Apply to affected area bid   nitroGLYCERIN (NITROSTAT) 0.4 MG SL tablet DISSOLVE (1) TABLET UNDER TONGUE AS NEEDED TO RELIEVE CHEST PAIN. MAYREPEAT EVERY 5 MINUTES.   ondansetron (ZOFRAN-ODT) 4 MG disintegrating tablet Take 1 tablet (4 mg total) by mouth daily as needed for nausea.   pantoprazole (PROTONIX) 20 MG tablet Take 1 tablet by mouth daily.   prednisoLONE acetate (PRED FORTE) 1 % ophthalmic suspension Place 1 drop into both eyes 2 (two) times daily as needed (for inflammation).   REPATHA 140 MG/ML SOSY Inject into the skin.   rosuvastatin (CRESTOR) 5 MG tablet  Take 1 tablet 3x/week   verapamil (CALAN-SR) 120 MG CR tablet Take 120 mg by mouth every evening.    erythromycin ophthalmic ointment 1 Application. (Patient not taking: Reported on 04/19/2023)   No facility-administered encounter medications on file as of 04/19/2023.    Allergies (verified) Ciprofloxacin, Keflex [cephalexin], Lisinopril, Meclizine, Monurol [fosfomycin tromethamine  (obsolete)], Sulfa antibiotics, Tessalon [benzonatate], Influenza virus vaccine, and Codeine   History: Past Medical History:  Diagnosis Date   Allergy    Anemia    Anxiety    Chicken pox    Complication of anesthesia    vomitting   Coronary  artery disease    Crohn disease (HCC)    DVT (deep venous thrombosis) (HCC) 05-22-2016   Dyspnea    on excertion   Dysrhythmia 22-May-2018   palpitations   GERD (gastroesophageal reflux disease)    Heart disease    Heart murmur    Heart murmur    History of kidney stones    Hyperlipidemia    Hypertension    Inflammatory bowel disease    Migraines    hormonal, puberty   Myocardial infarction (HCC)    Phlebitis    PONV (postoperative nausea and vomiting)    Past Surgical History:  Procedure Laterality Date   BREAST BIOPSY Left 07/05/2019   FIBROEPITHELIAL lesion/neg   BREAST CYST ASPIRATION     unsure of side   CARPAL TUNNEL RELEASE Right 12/02/2017   Procedure: CARPAL TUNNEL RELEASE ENDOSCOPIC;  Surgeon: Christena Flake, MD;  Location: [facility] OR[facility];  Service: Orthopedics;  Laterality: Right;   CORONARY ANGIOPLASTY WITH STENT PLACEMENT  May 22, 2016   x 2   EYE SURGERY Bilateral    cataract surgery   GANGLION CYST EXCISION Right 12/02/2017   Procedure: REMOVAL GANGLION OF WRIST;  Surgeon: Christena Flake, MD;  Location: [facility];  Service: Orthopedics;  Laterality: Right;   heart murmur     HYSTEROSCOPY WITH D & C N/A 04/19/2020   Procedure: DILATATION AND CURETTAGE /HYSTEROSCOPY, POSSIBLE  POLYPECTOMY;  Surgeon: Christeen Douglas, MD;  Location: [facility];  Service: Gynecology;  Laterality: N/A;   TONSILLECTOMY AND ADENOIDECTOMY  1962   TUBAL LIGATION  1993   tubaligation  1990   Family History  Problem Relation Age of Onset   Heart disease Father    Heart disease Brother    Cancer Maternal Aunt    Breast cancer Maternal Aunt    Stroke Mother    Kidney disease Neg Hx    GU problems Neg Hx    Kidney cancer Neg Hx    Social History   Socioeconomic History   Marital status: Single    Spouse name: Not on file   Number of children: Not on file   Years of education: Not on file   Highest education level: Not on file  Occupational History   Not on file  Tobacco Use   Smoking status: Never    Smokeless tobacco: Never  Vaping Use   Vaping Use: Never used  Substance and Sexual Activity   Alcohol use: No    Alcohol/week: 0.0 standard drinks of alcohol   Drug use: No   Sexual activity: Not on file  Other Topics Concern   Not on file  Social History Narrative   Had 2 kids. Daughter died in 05/22/2012 son living    Former Writer    Social Determinants of Health   Financial Resource Strain: Low Risk  (04/19/2023)   Overall Physicist, medical Strain (  CARDIA)    Difficulty of Paying Living Expenses: Not hard at all  Food Insecurity: No Food Insecurity (04/19/2023)   Hunger Vital Sign    Worried About Running Out of Food in the Last Year: Never true    Ran Out of Food in the Last Year: Never true  Transportation Needs: No Transportation Needs (04/19/2023)   PRAPARE - Administrator, Civil Service (Medical): No    Lack of Transportation (Non-Medical): No  Physical Activity: Sufficiently Active (04/19/2023)   Exercise Vital Sign    Days of Exercise per Week: 5 days    Minutes of Exercise per Session: 30 min  Recent Concern: Physical Activity - Insufficiently Active (04/19/2023)   Exercise Vital Sign    Days of Exercise per Week: 3 days    Minutes of Exercise per Session: 30 min  Stress: No Stress Concern Present (04/19/2023)   [employer] of Occupational Health - Occupational Stress Questionnaire    Feeling of Stress : Not at all  Social Connections: Unknown (04/19/2023)   Social Connection and Isolation Panel [NHANES]    Frequency of Communication with Friends and Family: More than three times a week    Frequency of Social Gatherings with Friends and Family: Twice a week    Attends Religious Services: More than 4 times per year    Active Member of [employer] or Organizations: No    Attends Engineer, structural: Never    Marital Status: Not on file    Tobacco Counseling Counseling given: Not Answered   Clinical Intake:  Pre-visit preparation  completed: Yes  Pain : No/denies pain     Nutritional Risks: None Diabetes: No  How often do you need to have someone help you when you read instructions, pamphlets, or other written materials from your doctor or pharmacy?: 1 - Never  Diabetic?no  Interpreter Needed?: No  Information entered by :: Kennedy Bucker, LPN   Activities of Daily Living    04/19/2023    9:06 AM  In your present state of health, do you have any difficulty performing the following activities:  Hearing? 0  Vision? 0  Difficulty concentrating or making decisions? 0  Walking or climbing stairs? 0  Dressing or bathing? 0  Doing errands, shopping? 0  Preparing Food and eating ? N  Using the Toilet? N  In the past six months, have you accidently leaked urine? N  Do you have problems with loss of bowel control? N  Managing your Medications? N  Managing your Finances? N  Housekeeping or managing your Housekeeping? N    Patient Care Team: Dale , MD as PCP - General (Internal Medicine)  Indicate any recent Medical Services you may have received from other than [facility] providers in the past year (date may be approximate).     Assessment:   This is a routine wellness examination for [place].  Hearing/Vision screen Hearing Screening - Comments:: No aids Vision Screening - Comments:: Readers- Dr.Brasington  Dietary issues and exercise activities discussed: Current Exercise Habits: Home exercise routine, Type of exercise: walking, Time (Minutes): 30, Frequency (Times/Week): 5, Weekly Exercise (Minutes/Week): 150, Intensity: Mild   Goals Addressed             This Visit's Progress    DIET - EAT MORE FRUITS AND VEGETABLES         Depression Screen    04/19/2023    9:01 AM 03/04/2023    4:33 PM 10/28/2022   10:44 AM 04/10/2022  11:00 AM 04/08/2022   10:20 AM 10/09/2021    2:46 PM 08/26/2021   12:27 PM  PHQ 2/9 Scores  PHQ - 2 Score 0 0 0  1 0 0  PHQ- 9 Score 0  0  2  1  Exception  Documentation    Other- indicate reason in comment box       Fall Risk    04/19/2023    9:05 AM 03/04/2023    4:32 PM 10/28/2022   10:44 AM 04/10/2022   10:46 AM 04/08/2022   10:19 AM  Fall Risk   Falls in the past year? 0 0 1  1  Comment    None since last reported 2 days.   Number falls in past yr: 0 0 0  0  Injury with Fall? 0 0 1  0  Risk for fall due to : No Fall Risks No Fall Risks History of fall(s)  History of fall(s)  Follow up Falls prevention discussed;Falls evaluation completed Falls evaluation completed Falls evaluation completed Falls evaluation completed Falls evaluation completed    FALL RISK PREVENTION PERTAINING TO THE HOME:  Any stairs in or around the home? Yes  If so, are there any without handrails? No  Home free of loose throw rugs in walkways, pet beds, electrical cords, etc? Yes  Adequate lighting in your home to reduce risk of falls? Yes   ASSISTIVE DEVICES UTILIZED TO PREVENT FALLS:  Life alert? No  Use of a cane, walker or w/c? No  Grab bars in the bathroom? Yes  Shower chair or bench in shower? No  Elevated toilet seat or a handicapped toilet? No   Cognitive Function:        04/19/2023    9:12 AM 04/10/2022   11:04 AM  6CIT Screen  What Year? 0 points 0 points  What month? 0 points 0 points  What time? 0 points 0 points  Count back from 20 0 points 0 points  Months in reverse 0 points 0 points  Repeat phrase 0 points 0 points  Total Score 0 points 0 points    Immunizations Immunization History  Administered Date(s) Administered   Tdap 05/26/2010    TDAP status: Due, Education has been provided regarding the importance of this vaccine. Advised may receive this vaccine at local pharmacy or Health Dept. Aware to provide a copy of the vaccination record if obtained from local pharmacy or Health Dept. Verbalized acceptance and understanding.  Flu Vaccine status: Declined, Education has been provided regarding the importance of this vaccine  but patient still declined. Advised may receive this vaccine at local pharmacy or Health Dept. Aware to provide a copy of the vaccination record if obtained from local pharmacy or Health Dept. Verbalized acceptance and understanding.  Pneumococcal vaccine status: Declined,  Education has been provided regarding the importance of this vaccine but patient still declined. Advised may receive this vaccine at local pharmacy or Health Dept. Aware to provide a copy of the vaccination record if obtained from local pharmacy or Health Dept. Verbalized acceptance and understanding.   Covid-19 vaccine status: Declined, Education has been provided regarding the importance of this vaccine but patient still declined. Advised may receive this vaccine at local pharmacy or Health Dept.or vaccine clinic. Aware to provide a copy of the vaccination record if obtained from local pharmacy or Health Dept. Verbalized acceptance and understanding.  Qualifies for Shingles Vaccine? Yes   Zostavax completed No   Shingrix Completed?: No.    Education  has been provided regarding the importance of this vaccine. Patient has been advised to call insurance company to determine out of pocket expense if they have not yet received this vaccine. Advised may also receive vaccine at local pharmacy or Health Dept. Verbalized acceptance and understanding.  Screening Tests Health Maintenance  Topic Date Due   Pneumonia Vaccine 12+ Years old (1 of 1 - PCV) 10/29/2023 (Originally 02/14/2020)   INFLUENZA VACCINE  06/10/2023   MAMMOGRAM  08/05/2023   Medicare Annual Wellness (AWV)  04/18/2024   Colonoscopy  10/19/2029   DEXA SCAN  Completed   Hepatitis C Screening  Completed   HPV VACCINES  Aged Out   DTaP/Tdap/Td  Discontinued   COVID-19 Vaccine  Discontinued   Zoster Vaccines- Shingrix  Discontinued    Health Maintenance  There are no preventive care reminders to display for this patient.   Colorectal cancer screening: Type of  screening: Colonoscopy. Completed 10/20/19. Repeat every 10 years has appt w/ GI on 05/29/23  Mammogram status: Completed 08/04/22. Repeat every year  Bone Density status: Completed 09/17/20. Results reflect: Bone density results: OSTEOPENIA. Repeat every 5 years.  Lung Cancer Screening: (Low Dose CT Chest recommended if Age 29-80 years, 30 pack-year currently smoking OR have quit w/in 15years.) does not qualify.   Additional Screening:  Hepatitis C Screening: does qualify; Completed 04/25/14  Vision Screening: Recommended annual ophthalmology exams for early detection of glaucoma and other disorders of the eye. Is the patient up to date with their annual eye exam?  Yes  Who is the provider or what is the name of the office in which the patient attends annual eye exams? Dr.Brasington If pt is not established with a provider, would they like to be referred to a provider to establish care? No .   Dental Screening: Recommended annual dental exams for proper oral hygiene  Community Resource Referral / Chronic Care Management: CRR required this visit?  No   CCM required this visit?  No      Plan:     I have personally reviewed and noted the following in the patient's chart:   Medical and social history Use of alcohol, tobacco or illicit drugs  Current medications and supplements including opioid prescriptions. Patient is not currently taking opioid prescriptions. Functional ability and status Nutritional status Physical activity Advanced directives List of other physicians Hospitalizations, surgeries, and ER visits in previous 12 months Vitals Screenings to include cognitive, depression, and falls Referrals and appointments  In addition, I have reviewed and discussed with patient certain preventive protocols, quality metrics, and best practice recommendations. A written personalized care plan for preventive services as well as general preventive health recommendations were provided  to patient.     Hal Hope, LPN   1/61/0960   Nurse Notes: none

## 2023-05-06 DIAGNOSIS — H6063 Unspecified chronic otitis externa, bilateral: Secondary | ICD-10-CM | POA: Diagnosis not present

## 2023-05-06 DIAGNOSIS — H903 Sensorineural hearing loss, bilateral: Secondary | ICD-10-CM | POA: Diagnosis not present

## 2023-06-02 ENCOUNTER — Telehealth: Payer: Self-pay | Admitting: Internal Medicine

## 2023-06-02 ENCOUNTER — Other Ambulatory Visit: Payer: Self-pay

## 2023-06-02 DIAGNOSIS — K2100 Gastro-esophageal reflux disease with esophagitis, without bleeding: Secondary | ICD-10-CM | POA: Diagnosis not present

## 2023-06-02 DIAGNOSIS — Z86010 Personal history of colonic polyps: Secondary | ICD-10-CM | POA: Diagnosis not present

## 2023-06-02 DIAGNOSIS — K219 Gastro-esophageal reflux disease without esophagitis: Secondary | ICD-10-CM | POA: Diagnosis not present

## 2023-06-02 DIAGNOSIS — E559 Vitamin D deficiency, unspecified: Secondary | ICD-10-CM

## 2023-06-02 DIAGNOSIS — R159 Full incontinence of feces: Secondary | ICD-10-CM | POA: Diagnosis not present

## 2023-06-02 DIAGNOSIS — E7800 Pure hypercholesterolemia, unspecified: Secondary | ICD-10-CM

## 2023-06-02 DIAGNOSIS — K5010 Crohn's disease of large intestine without complications: Secondary | ICD-10-CM | POA: Diagnosis not present

## 2023-06-02 MED ORDER — ESCITALOPRAM OXALATE 20 MG PO TABS: 20.0000 mg | ORAL_TABLET | Freq: Every day | ORAL | 1 refills | Status: DC

## 2023-06-02 NOTE — Telephone Encounter (Signed)
Medication refilled

## 2023-06-02 NOTE — Telephone Encounter (Signed)
Pt called in stating that she would like to speak to Dr. Lorin Picket nurse concerning some questions regarding vitamins. Pt stated she's available @[phone].

## 2023-06-02 NOTE — Telephone Encounter (Signed)
Prescription Request  06/02/2023  LOV: 03/04/2023  What is the name of the medication or equipment? escitalopram (LEXAPRO) 20 MG tablet  Have you contacted your pharmacy to request a refill? Yes   Which pharmacy would you like this sent to?   [facility] - [place], [place] - [address] [place] [address] Phone: [phone] Fax: [phone]    Patient notified that their request is being sent to the clinical staff for review and that they should receive a response within 2 business days.   Please advise at Mobile [phone] (mobile)

## 2023-06-04 NOTE — Telephone Encounter (Signed)
LMTCB

## 2023-06-04 NOTE — Telephone Encounter (Signed)
Vit D level added, fasting labs ordered prior to Aug appt.

## 2023-06-04 NOTE — Telephone Encounter (Signed)
She is scheduled for a lab appt in 06/2023 prior to her appt. Please check a vitamin D level with those labs.  (Dx vitamin D def).  Regarding dosing, ok to have her take the 5000 units - have her take 3 days per week.

## 2023-06-04 NOTE — Telephone Encounter (Signed)
Patient has been taking vitamin d3 2000 units per day. She accidentally bought vitamin d3 5000 units and cannot return it. IS this ok for her to take or can she take every other day until this bottle is gone?

## 2023-06-30 ENCOUNTER — Other Ambulatory Visit (INDEPENDENT_AMBULATORY_CARE_PROVIDER_SITE_OTHER): Payer: Medicare PPO

## 2023-06-30 DIAGNOSIS — E7800 Pure hypercholesterolemia, unspecified: Secondary | ICD-10-CM

## 2023-06-30 DIAGNOSIS — E559 Vitamin D deficiency, unspecified: Secondary | ICD-10-CM | POA: Diagnosis not present

## 2023-06-30 LAB — BASIC METABOLIC PANEL
BUN: 17 mg/dL (ref 6–23)
CO2: 28 meq/L (ref 19–32)
Calcium: 9.2 mg/dL (ref 8.4–10.5)
Chloride: 106 meq/L (ref 96–112)
Creatinine, Ser: 0.82 mg/dL (ref 0.40–1.20)
GFR: 73.52 mL/min (ref >60.00)
Glucose, Bld: 100 mg/dL — ABNORMAL HIGH (ref 70–99)
Potassium: 3.9 meq/L (ref 3.5–5.1)
Sodium: 141 meq/L (ref 135–145)

## 2023-06-30 LAB — LIPID PANEL
Cholesterol: 115 mg/dL (ref 0–200)
HDL: 47.7 mg/dL (ref >39.00)
LDL Cholesterol: 44 mg/dL (ref 0–99)
NonHDL: 67.17
Total CHOL/HDL Ratio: 2
Triglycerides: 115 mg/dL (ref 0.0–149.0)
VLDL: 23 mg/dL (ref 0.0–40.0)

## 2023-06-30 LAB — HEPATIC FUNCTION PANEL
ALT: 19 U/L (ref 0–35)
AST: 19 U/L (ref 0–37)
Albumin: 4.1 g/dL (ref 3.5–5.2)
Alkaline Phosphatase: 76 U/L (ref 39–117)
Bilirubin, Direct: 0.1 mg/dL (ref 0.0–0.3)
Total Bilirubin: 0.6 mg/dL (ref 0.2–1.2)
Total Protein: 6.8 g/dL (ref 6.0–8.3)

## 2023-07-02 LAB — VITAMIN D 25 HYDROXY (VIT D DEFICIENCY, FRACTURES): VITD: 51.76 ng/mL (ref 30.00–100.00)

## 2023-07-02 LAB — TSH: TSH: 1.91 u[IU]/mL (ref 0.35–5.50)

## 2023-07-05 ENCOUNTER — Other Ambulatory Visit: Payer: Medicare PPO

## 2023-07-06 DIAGNOSIS — I10 Essential (primary) hypertension: Secondary | ICD-10-CM | POA: Diagnosis not present

## 2023-07-06 DIAGNOSIS — I214 Non-ST elevation (NSTEMI) myocardial infarction: Secondary | ICD-10-CM | POA: Diagnosis not present

## 2023-07-06 DIAGNOSIS — I471 Supraventricular tachycardia, unspecified: Secondary | ICD-10-CM | POA: Diagnosis not present

## 2023-07-06 DIAGNOSIS — I2510 Atherosclerotic heart disease of native coronary artery without angina pectoris: Secondary | ICD-10-CM | POA: Diagnosis not present

## 2023-07-06 DIAGNOSIS — I6523 Occlusion and stenosis of bilateral carotid arteries: Secondary | ICD-10-CM | POA: Diagnosis not present

## 2023-07-06 DIAGNOSIS — T82855A Stenosis of coronary artery stent, initial encounter: Secondary | ICD-10-CM | POA: Diagnosis not present

## 2023-07-06 DIAGNOSIS — Z955 Presence of coronary angioplasty implant and graft: Secondary | ICD-10-CM | POA: Diagnosis not present

## 2023-07-06 DIAGNOSIS — E782 Mixed hyperlipidemia: Secondary | ICD-10-CM | POA: Diagnosis not present

## 2023-07-06 DIAGNOSIS — I739 Peripheral vascular disease, unspecified: Secondary | ICD-10-CM | POA: Diagnosis not present

## 2023-07-08 ENCOUNTER — Ambulatory Visit: Payer: Medicare PPO | Admitting: Internal Medicine

## 2023-07-08 ENCOUNTER — Encounter: Payer: Self-pay | Admitting: Internal Medicine

## 2023-07-08 ENCOUNTER — Other Ambulatory Visit: Payer: Medicare PPO

## 2023-07-08 VITALS — BP 124/72 | HR 66 | Temp 97.9°F | Resp 16 | Ht 61.0 in | Wt 165.2 lb

## 2023-07-08 DIAGNOSIS — F320 Major depressive disorder, single episode, mild: Secondary | ICD-10-CM | POA: Diagnosis not present

## 2023-07-08 DIAGNOSIS — I10 Essential (primary) hypertension: Secondary | ICD-10-CM

## 2023-07-08 DIAGNOSIS — I779 Disorder of arteries and arterioles, unspecified: Secondary | ICD-10-CM | POA: Diagnosis not present

## 2023-07-08 DIAGNOSIS — I25118 Atherosclerotic heart disease of native coronary artery with other forms of angina pectoris: Secondary | ICD-10-CM

## 2023-07-08 DIAGNOSIS — E7800 Pure hypercholesterolemia, unspecified: Secondary | ICD-10-CM | POA: Diagnosis not present

## 2023-07-08 DIAGNOSIS — I700 Atherosclerosis of aorta: Secondary | ICD-10-CM

## 2023-07-08 DIAGNOSIS — D509 Iron deficiency anemia, unspecified: Secondary | ICD-10-CM | POA: Diagnosis not present

## 2023-07-08 DIAGNOSIS — K219 Gastro-esophageal reflux disease without esophagitis: Secondary | ICD-10-CM

## 2023-07-08 DIAGNOSIS — M25511 Pain in right shoulder: Secondary | ICD-10-CM

## 2023-07-08 DIAGNOSIS — K50919 Crohn's disease, unspecified, with unspecified complications: Secondary | ICD-10-CM | POA: Diagnosis not present

## 2023-07-08 DIAGNOSIS — E559 Vitamin D deficiency, unspecified: Secondary | ICD-10-CM

## 2023-07-08 NOTE — Progress Notes (Signed)
Subjective:    Patient ID: Morgan Moore, female    DOB: 06-26-1956, 67 y.o.   MRN: [id number]  Patient here for  Chief Complaint  Patient presents with   Medical Management of Chronic Issues    HPI Here to follow up regarding her blood pressure, CAD and cholesterol.   Saw Dr Gilda Crease 04/2022 - Given the patient's asymptomatic subcritical stenosis no further invasive testing or surgery recommended at that time. Duplex ultrasound shows 1-39% stenosis bilaterally. Continue antiplatelet therapy as prescribed Continue management of CAD, HTN and Hyperlipidemia. Follow up in 12 months with duplex ultrasound and physical exam.  Had f/u 04/08/23 - carotid duplex - bilateral carotid - 1-39%.  Had f/u with GI 06/02/23 - f/u crohns.  Recommended to continue mesalamine and protonix.  Recommended EGD and colonoscopy.  Feels - overall stable. Had follow up with Dr Juliann Pares 07/06/23.  Note reviewed.  Discussed sob.  Will have some days better than others.  Feels overall heart/breathing stable. Discussed further cardiac f/u / evaluation.  Feels stable.  Does report increased right shoulder pain.  Increased pain - movement - rom.  Appears to be handling stress relatively well.    Past Medical History:  Diagnosis Date   Allergy    Anemia    Anxiety    Chicken pox    Complication of anesthesia    vomitting   Coronary artery disease    Crohn disease (HCC)    DVT (deep venous thrombosis) (HCC) 2017   Dyspnea    on excertion   Dysrhythmia 2019   palpitations   GERD (gastroesophageal reflux disease)    Heart disease    Heart murmur    Heart murmur    History of kidney stones    Hyperlipidemia    Hypertension    Inflammatory bowel disease    Migraines    hormonal, puberty   Myocardial infarction (HCC)    Phlebitis    PONV (postoperative nausea and vomiting)    Past Surgical History:  Procedure Laterality Date   BREAST BIOPSY Left 07/05/2019   FIBROEPITHELIAL lesion/neg   BREAST CYST  ASPIRATION     unsure of side   CARPAL TUNNEL RELEASE Right 12/02/2017   Procedure: CARPAL TUNNEL RELEASE ENDOSCOPIC;  Surgeon: Christena Flake, MD;  Location: [facility];  Service: Orthopedics;  Laterality: Right;   CORONARY ANGIOPLASTY WITH STENT PLACEMENT  2017   x 2   EYE SURGERY Bilateral    cataract surgery   GANGLION CYST EXCISION Right 12/02/2017   Procedure: REMOVAL GANGLION OF WRIST;  Surgeon: Christena Flake, MD;  Location: [facility];  Service: Orthopedics;  Laterality: Right;   heart murmur     HYSTEROSCOPY WITH D & C N/A 04/19/2020   Procedure: DILATATION AND CURETTAGE /HYSTEROSCOPY, POSSIBLE  POLYPECTOMY;  Surgeon: Christeen Douglas, MD;  Location: [facility];  Service: Gynecology;  Laterality: N/A;   TONSILLECTOMY AND ADENOIDECTOMY  1962   TUBAL LIGATION  1993   tubaligation  1990   Family History  Problem Relation Age of Onset   Heart disease Father    Heart disease Brother    Cancer Maternal Aunt    Breast cancer Maternal Aunt    Stroke Mother    Kidney disease Neg Hx    GU problems Neg Hx    Kidney cancer Neg Hx    Social History   Socioeconomic History   Marital status: Single    Spouse name: Not on file   Number of  children: Not on file   Years of education: Not on file   Highest education level: Not on file  Occupational History   Not on file  Tobacco Use   Smoking status: Never   Smokeless tobacco: Never  Vaping Use   Vaping status: Never Used  Substance and Sexual Activity   Alcohol use: No    Alcohol/week: 0.0 standard drinks of alcohol   Drug use: No   Sexual activity: Not on file  Other Topics Concern   Not on file  Social History Narrative   Had 2 kids. Daughter died in 04-Aug-2012 son living    Former Writer    Social Determinants of Health   Financial Resource Strain: Low Risk  (04/19/2023)   Overall Financial Resource Strain (CARDIA)    Difficulty of Paying Living Expenses: Not hard at all  Food Insecurity: No Food Insecurity (04/19/2023)    Hunger Vital Sign    Worried About Running Out of Food in the Last Year: Never true    Ran Out of Food in the Last Year: Never true  Transportation Needs: No Transportation Needs (04/19/2023)   PRAPARE - Administrator, Civil Service (Medical): No    Lack of Transportation (Non-Medical): No  Physical Activity: Sufficiently Active (04/19/2023)   Exercise Vital Sign    Days of Exercise per Week: 5 days    Minutes of Exercise per Session: 30 min  Recent Concern: Physical Activity - Insufficiently Active (04/19/2023)   Exercise Vital Sign    Days of Exercise per Week: 3 days    Minutes of Exercise per Session: 30 min  Stress: No Stress Concern Present (04/19/2023)   [employer] of Occupational Health - Occupational Stress Questionnaire    Feeling of Stress : Not at all  Social Connections: Unknown (04/19/2023)   Social Connection and Isolation Panel [NHANES]    Frequency of Communication with Friends and Family: More than three times a week    Frequency of Social Gatherings with Friends and Family: Twice a week    Attends Religious Services: More than 4 times per year    Active Member of [employer] or Organizations: No    Attends Banker Meetings: Never    Marital Status: Not on file     Review of Systems  Constitutional:  Negative for appetite change and unexpected weight change.  HENT:  Negative for congestion and sinus pressure.   Respiratory:  Positive for shortness of breath. Negative for cough and chest tightness.   Cardiovascular:  Negative for chest pain, palpitations and leg swelling.  Gastrointestinal:  Negative for abdominal pain, diarrhea, nausea and vomiting.  Genitourinary:  Negative for difficulty urinating and dysuria.  Musculoskeletal:  Negative for joint swelling and myalgias.  Skin:  Negative for color change and rash.  Neurological:  Negative for dizziness and headaches.  Psychiatric/Behavioral:  Negative for agitation and dysphoric mood.         Objective:     BP 124/72   Pulse 66   Temp 97.9 F (36.6 C) (Oral)   Resp 16   Ht 5\' 1"  (1.549 m)   Wt 165 lb 3.2 oz (74.9 kg)   SpO2 99%   BMI 31.21 kg/m  Wt Readings from Last 3 Encounters:  07/08/23 165 lb 3.2 oz (74.9 kg)  04/19/23 164 lb (74.4 kg)  04/08/23 164 lb (74.4 kg)    Physical Exam Vitals reviewed.  Constitutional:      General: She is not  in acute distress.    Appearance: Normal appearance.  HENT:     Head: Normocephalic and atraumatic.     Right Ear: External ear normal.     Left Ear: External ear normal.  Eyes:     General: No scleral icterus.       Right eye: No discharge.        Left eye: No discharge.     Conjunctiva/sclera: Conjunctivae normal.  Neck:     Thyroid: No thyromegaly.  Cardiovascular:     Rate and Rhythm: Normal rate and regular rhythm.  Pulmonary:     Effort: No respiratory distress.     Breath sounds: Normal breath sounds. No wheezing.  Abdominal:     General: Bowel sounds are normal.     Palpations: Abdomen is soft.     Tenderness: There is no abdominal tenderness.  Musculoskeletal:        General: No swelling or tenderness.     Cervical back: Neck supple. No tenderness.  Lymphadenopathy:     Cervical: No cervical adenopathy.  Skin:    Findings: No erythema or rash.  Neurological:     Mental Status: She is alert.  Psychiatric:        Mood and Affect: Mood normal.        Behavior: Behavior normal.      Outpatient Encounter Medications as of 07/08/2023  Medication Sig   aspirin EC 81 MG tablet Take 81 mg by mouth daily.   Cholecalciferol 25 MCG (1000 UT) tablet Take 1,000 Units by mouth 2 (two) times daily.   clopidogrel (PLAVIX) 75 MG tablet Take 75 mg by mouth daily.   cyanocobalamin 1000 MCG tablet Take 1,000 mcg by mouth daily at 12 noon.    diphenhydrAMINE (BENADRYL) 12.5 MG/5ML elixir Take 12.5 mg by mouth every 6 (six) hours as needed (allergic reaction).   EPINEPHrine 0.3 mg/0.3 mL IJ SOAJ injection  Inject 0.3 mg into the muscle once.    escitalopram (LEXAPRO) 20 MG tablet Take 1 tablet (20 mg total) by mouth daily.   Fe Fum-FePoly-Vit C-Vit B3 (INTEGRA) 62.5-62.5-40-3 MG CAPS TAKE ONE CAPSULE BY MOUTH three times WEEKLY.   fluocinonide (LIDEX) 0.05 % external solution Apply 1 Application topically 2 (two) times daily.   Melatonin-Pyridoxine 3-10 MG TABS Take 1 tablet by mouth at bedtime.   mesalamine (LIALDA) 1.2 g EC tablet Take 1.2 g by mouth 3 (three) times daily with meals.    mupirocin ointment (BACTROBAN) 2 % Apply to affected area bid   nitroGLYCERIN (NITROSTAT) 0.4 MG SL tablet DISSOLVE (1) TABLET UNDER TONGUE AS NEEDED TO RELIEVE CHEST PAIN. MAYREPEAT EVERY 5 MINUTES.   ondansetron (ZOFRAN-ODT) 4 MG disintegrating tablet Take 1 tablet (4 mg total) by mouth daily as needed for nausea.   pantoprazole (PROTONIX) 20 MG tablet Take 1 tablet by mouth daily.   prednisoLONE acetate (PRED FORTE) 1 % ophthalmic suspension Place 1 drop into both eyes 2 (two) times daily as needed (for inflammation).   REPATHA 140 MG/ML SOSY Inject into the skin.   rosuvastatin (CRESTOR) 5 MG tablet Take 1 tablet 3x/week   verapamil (CALAN-SR) 120 MG CR tablet Take 120 mg by mouth every evening.    [DISCONTINUED] erythromycin ophthalmic ointment 1 Application. (Patient not taking: Reported on 04/19/2023)   No facility-administered encounter medications on file as of 07/08/2023.     Lab Results  Component Value Date   WBC 7.9 10/26/2022   HGB 13.4 10/26/2022   HCT 39.3  10/26/2022   PLT 320.0 10/26/2022   GLUCOSE 100 (H) 06/30/2023   CHOL 115 06/30/2023   TRIG 115.0 06/30/2023   HDL 47.70 06/30/2023   LDLCALC 44 06/30/2023   ALT 19 06/30/2023   AST 19 06/30/2023   NA 141 06/30/2023   K 3.9 06/30/2023   CL 106 06/30/2023   CREATININE 0.82 06/30/2023   BUN 17 06/30/2023   CO2 28 06/30/2023   TSH 1.91 06/30/2023   INR 1.0 12/23/2017    MM 3D SCREEN BREAST BILATERAL  Result Date:  08/05/2022 CLINICAL DATA:  Screening. EXAM: DIGITAL SCREENING BILATERAL MAMMOGRAM WITH TOMOSYNTHESIS AND CAD TECHNIQUE: Bilateral screening digital craniocaudal and mediolateral oblique mammograms were obtained. Bilateral screening digital breast tomosynthesis was performed. The images were evaluated with computer-aided detection. COMPARISON:  Previous exam(s). ACR Breast Density Category c: The breast tissue is heterogeneously dense, which may obscure small masses. FINDINGS: There are no findings suspicious for malignancy. IMPRESSION: No mammographic evidence of malignancy. A result letter of this screening mammogram will be mailed directly to the patient. RECOMMENDATION: Screening mammogram in one year. (Code:SM-B-01Y) BI-RADS CATEGORY  1: Negative. Electronically Signed   By: Edwin Cap M.D.   On: 08/05/2022 09:52       Assessment & Plan:  Hypercholesterolemia Assessment & Plan: Continue crestor.  Discussed concerns regarding msk pain.  Discussed recent labs.  Low cholesterol diet and exercise. Follow lipid panel and liver function tests.  Follow symptoms. Call with update.   Orders: -     Lipid panel; Future -     Hepatic function panel; Future -     Basic metabolic panel; Future  Iron deficiency anemia, unspecified iron deficiency anemia type Assessment & Plan: Follow cbc.    Aortic atherosclerosis (HCC) Assessment & Plan: Continue crestor.     Carotid artery disease, unspecified laterality, unspecified type [facility]) Assessment & Plan: Continue crestor and aspirin.  Continue risk factor modification.  Saw Dr Gilda Crease 04/2022 - Given the patient's asymptomatic subcritical stenosis no further invasive testing or surgery recommended. Duplex ultrasound shows 1-39% stenosis bilaterally (04/08/23). Continue antiplatelet therapy as prescribed Continue management of CAD, HTN and Hyperlipidemia. Follow up in 12 months with duplex ultrasound and physical exam    Coronary artery disease of  native artery of native heart with stable angina pectoris [facility]) Assessment & Plan: Continue crestor and aspirin.  Continues verapamil as outlined.  Continue risk factor modification.  Seeing cardiology.    Crohn's disease with complication, unspecified gastrointestinal tract location [facility]) Assessment & Plan: Has been on lialda.  Bowels have been relatively stable.  Follow.  Had f/u with GI 05/2023.     Essential hypertension Assessment & Plan: On verapamil.  Follow pressures.  Follow metabolic panel.    Gastroesophageal reflux disease, unspecified whether esophagitis present Assessment & Plan: No upper symptoms reported.  On protonix.    Major depressive disorder, single episode, mild (HCC) Assessment & Plan: Overall appears to be doing well. On lexapro.  Follow.    Vitamin D deficiency Assessment & Plan: Continue vitamin D supplements.    Right shoulder pain, unspecified chronicity Assessment & Plan: Right shoulder pain as outlined.  Persistent.  Discussed PT.  Agreeable.   Orders: -     Ambulatory referral to Physical Therapy     Dale Goshen, MD

## 2023-07-11 ENCOUNTER — Encounter: Payer: Self-pay | Admitting: Internal Medicine

## 2023-07-11 NOTE — Assessment & Plan Note (Signed)
No upper symptoms reported.  On protonix.   

## 2023-07-11 NOTE — Assessment & Plan Note (Signed)
Overall appears to be doing well. On lexapro.  Follow.

## 2023-07-11 NOTE — Assessment & Plan Note (Signed)
Follow cbc.  

## 2023-07-11 NOTE — Assessment & Plan Note (Signed)
Continue crestor and aspirin.  Continues verapamil as outlined.  Continue risk factor modification.  Seeing cardiology.

## 2023-07-11 NOTE — Assessment & Plan Note (Signed)
Has been on lialda.  Bowels have been relatively stable.  Follow.  Had f/u with GI 05/2023.

## 2023-07-11 NOTE — Assessment & Plan Note (Signed)
Continue vitamin D supplements.  

## 2023-07-11 NOTE — Assessment & Plan Note (Signed)
Continue crestor.  Discussed concerns regarding msk pain.  Discussed recent labs.  Low cholesterol diet and exercise. Follow lipid panel and liver function tests.  Follow symptoms. Call with update.

## 2023-07-11 NOTE — Assessment & Plan Note (Signed)
Right shoulder pain as outlined.  Persistent.  Discussed PT.  Agreeable.

## 2023-07-11 NOTE — Assessment & Plan Note (Addendum)
Continue crestor and aspirin.  Continue risk factor modification.  Saw Dr Gilda Crease 04/2022 - Given the patient's asymptomatic subcritical stenosis no further invasive testing or surgery recommended. Duplex ultrasound shows 1-39% stenosis bilaterally (04/08/23). Continue antiplatelet therapy as prescribed Continue management of CAD, HTN and Hyperlipidemia. Follow up in 12 months with duplex ultrasound and physical exam

## 2023-07-11 NOTE — Assessment & Plan Note (Signed)
On verapamil.  Follow pressures.  Follow metabolic panel.  

## 2023-07-11 NOTE — Assessment & Plan Note (Signed)
Continue crestor 

## 2023-08-09 ENCOUNTER — Other Ambulatory Visit: Payer: Self-pay | Admitting: Internal Medicine

## 2023-08-09 DIAGNOSIS — Z1231 Encounter for screening mammogram for malignant neoplasm of breast: Secondary | ICD-10-CM

## 2023-08-11 ENCOUNTER — Ambulatory Visit
Admission: RE | Admit: 2023-08-11 | Discharge: 2023-08-11 | Disposition: A | Discharge status: Home / Self Care | Payer: Medicare PPO | Source: Ambulatory Visit | Attending: Internal Medicine | Admitting: Internal Medicine

## 2023-08-11 DIAGNOSIS — Z1231 Encounter for screening mammogram for malignant neoplasm of breast: Secondary | ICD-10-CM | POA: Diagnosis not present

## 2023-08-12 ENCOUNTER — Telehealth: Payer: Self-pay | Admitting: Internal Medicine

## 2023-08-12 NOTE — Telephone Encounter (Signed)
Patient just called and wants to know the update on her referral to a physical therapy. She said no one has called her yet. Her number is [phone]. She would like for someone to call her and let you know. [phone] is her cell number.

## 2023-08-12 NOTE — Telephone Encounter (Signed)
Patient following up on physical therapy referral.

## 2023-08-13 ENCOUNTER — Other Ambulatory Visit: Payer: Self-pay | Admitting: Internal Medicine

## 2023-08-13 DIAGNOSIS — N6489 Other specified disorders of breast: Secondary | ICD-10-CM

## 2023-08-13 DIAGNOSIS — R928 Other abnormal and inconclusive findings on diagnostic imaging of breast: Secondary | ICD-10-CM

## 2023-08-17 ENCOUNTER — Ambulatory Visit
Admission: RE | Admit: 2023-08-17 | Discharge: 2023-08-17 | Disposition: A | Discharge status: Home / Self Care | Payer: Medicare PPO | Source: Ambulatory Visit | Attending: Internal Medicine | Admitting: Internal Medicine

## 2023-08-17 DIAGNOSIS — N6489 Other specified disorders of breast: Secondary | ICD-10-CM | POA: Diagnosis not present

## 2023-08-17 DIAGNOSIS — R928 Other abnormal and inconclusive findings on diagnostic imaging of breast: Secondary | ICD-10-CM | POA: Insufficient documentation

## 2023-08-17 DIAGNOSIS — N6011 Diffuse cystic mastopathy of right breast: Secondary | ICD-10-CM | POA: Diagnosis not present

## 2023-08-17 DIAGNOSIS — R92333 Mammographic heterogeneous density, bilateral breasts: Secondary | ICD-10-CM | POA: Diagnosis not present

## 2023-08-17 DIAGNOSIS — N6001 Solitary cyst of right breast: Secondary | ICD-10-CM | POA: Diagnosis not present

## 2023-08-17 NOTE — Telephone Encounter (Signed)
noted 

## 2023-08-31 ENCOUNTER — Ambulatory Visit: Payer: Medicare PPO | Attending: Internal Medicine

## 2023-08-31 DIAGNOSIS — M25511 Pain in right shoulder: Secondary | ICD-10-CM | POA: Diagnosis not present

## 2023-08-31 DIAGNOSIS — R29898 Other symptoms and signs involving the musculoskeletal system: Secondary | ICD-10-CM | POA: Diagnosis not present

## 2023-08-31 DIAGNOSIS — M7541 Impingement syndrome of right shoulder: Secondary | ICD-10-CM | POA: Diagnosis not present

## 2023-08-31 DIAGNOSIS — M25611 Stiffness of right shoulder, not elsewhere classified: Secondary | ICD-10-CM | POA: Insufficient documentation

## 2023-08-31 DIAGNOSIS — G8929 Other chronic pain: Secondary | ICD-10-CM | POA: Diagnosis not present

## 2023-08-31 NOTE — Therapy (Signed)
OUTPATIENT PHYSICAL THERAPY SHOULDER EVALUATION   Patient Name: Silvina Bushway MRN: [id number] DOB:1956-09-20, 67 y.o., female Today's Date: 09/01/2023  END OF SESSION:  PT End of Session - 09/01/23 1146     Visit Number 1    Number of Visits 16    Date for PT Re-Evaluation 10/26/23    PT Start Time 1515    PT Stop Time 1602    PT Time Calculation (min) 47 min    Activity Tolerance Patient limited by pain    Behavior During Therapy [facility] for tasks assessed/performed             Past Medical History:  Diagnosis Date   Allergy    Anemia    Anxiety    Chicken pox    Complication of anesthesia    vomitting   Coronary artery disease    Crohn disease (HCC)    DVT (deep venous thrombosis) (HCC) 2017   Dyspnea    on excertion   Dysrhythmia 2019   palpitations   GERD (gastroesophageal reflux disease)    Heart disease    Heart murmur    Heart murmur    History of kidney stones    Hyperlipidemia    Hypertension    Inflammatory bowel disease    Migraines    hormonal, puberty   Myocardial infarction (HCC)    Phlebitis    PONV (postoperative nausea and vomiting)    Past Surgical History:  Procedure Laterality Date   BREAST BIOPSY Left 07/05/2019   FIBROEPITHELIAL lesion/neg   BREAST CYST ASPIRATION     unsure of side   CARPAL TUNNEL RELEASE Right 12/02/2017   Procedure: CARPAL TUNNEL RELEASE ENDOSCOPIC;  Surgeon: Christena Flake, MD;  Location: [facility] ORS;  Service: Orthopedics;  Laterality: Right;   CORONARY ANGIOPLASTY WITH STENT PLACEMENT  2017   x 2   EYE SURGERY Bilateral    cataract surgery   GANGLION CYST EXCISION Right 12/02/2017   Procedure: REMOVAL GANGLION OF WRIST;  Surgeon: Christena Flake, MD;  Location: [facility] OR[facility];  Service: Orthopedics;  Laterality: Right;   heart murmur     HYSTEROSCOPY WITH D & C N/A 04/19/2020   Procedure: DILATATION AND CURETTAGE /HYSTEROSCOPY, POSSIBLE  POLYPECTOMY;  Surgeon: Christeen Douglas, MD;  Location: [facility] ORS;  Service:  Gynecology;  Laterality: N/A;   TONSILLECTOMY AND ADENOIDECTOMY  1962   TUBAL LIGATION  1993   tubaligation  1990   Patient Active Problem List   Diagnosis Date Noted   Knee pain 11/08/2022   Tremor 11/08/2022   Ankle pain 04/12/2022   Chronic venous insufficiency 04/08/2022   Major depressive disorder, single episode, mild (HCC) 02/14/2022   Groin hematoma 02/14/2022   Enlarged lymph node in neck 10/12/2021   Aortic atherosclerosis (HCC) 10/12/2021   History of COVID-19 08/09/2021   Abdominal pain 11/30/2020   Toenail fungus 11/24/2020   Back pain 11/24/2020   Carotid artery disease (HCC) 07/28/2020   Leg skin lesion, right 03/24/2020   Shingles 12/10/2019   Abnormal mammogram 12/02/2019   Thickened endometrium 10/29/2019   Hematochezia 11/11/2018   Rectal polyp 10/28/2018   Vitamin D deficiency 01/18/2018   Iron deficiency anemia secondary to inadequate dietary iron intake 01/17/2018   Easy bruising 12/23/2017   Anemia 12/23/2017   Flexor carpi ulnaris tenosynovitis 12/03/2017   Carpal tunnel syndrome, right 10/15/2017   Ganglion of right wrist 10/15/2017   Leg length inequality 10/15/2017   Lumbar spondylosis 10/15/2017   Osteoporosis without current  pathological fracture 09/09/2017   Heart murmur 09/09/2017   Mild depression 09/09/2017   Crohn disease (HCC)    Chest pain 04/12/2017   Right shoulder pain 01/16/2017   History of frequent urinary tract infections 08/09/2016   Heart palpitations 08/03/2016   Weakness 07/02/2016   Light headedness 04/17/2016   Coronary artery disease of native artery of native heart with stable angina pectoris (HCC)    Hip pain 04/13/2016   Health care maintenance 01/19/2016   Essential hypertension 11/07/2015   Hypercholesterolemia 11/07/2015   Migraine headache 11/07/2015   Nephrolithiasis 11/07/2015   GERD (gastroesophageal reflux disease) 11/07/2015   Mitral regurgitation 11/07/2015   Stress 11/07/2015   Varicose veins of  both lower extremities with pain 05/10/2015   Pharyngoesophageal dysphagia 03/11/2015   Pancreatic lesion 04/25/2014   Hemangioma of liver 03/12/2014   Arthralgia 05/29/2011    PCP/REFERRING PROVIDER: Dale Clackamas, MD  REFERRING DIAG: Right shoulder pain, unspecified chronicity  THERAPY DIAG:  Chronic right shoulder pain  Impingement syndrome of right shoulder  Weakness of right shoulder  Stiffness of right shoulder, not elsewhere classified  Rationale for Evaluation and Treatment: Rehabilitation  ONSET DATE: August 2023  SUBJECTIVE:                                                                                                                                                                                      SUBJECTIVE STATEMENT: Pt reports to PT with c/c of chronic R shoulder pain. She reports helping her family set up a venue for a wedding and feeling like she "overdid" it with her shoulder; pt also reports continuing to do a lot of house and yard work with her shoulder which she feels didn't help the pain. She reports feeling "tingling" in her fingers however says she has had that for a while and it isn't a new occurrence since her shoulder pain started. Pt denies any previous episodes however says she did fracture her R shoulder a few years ago from a fall. No significant change in 24 hour behavior but pt reports her shoulder feels more stiff/painful after extended periods of rest. Hand dominance: Right  PERTINENT HISTORY: L meniscus tear, Crohn's disease, hx of 3 MI's and 5 stent placements (2022 most recent), Osteopenia  PAIN:  Are you having pain? Yes: NPRS scale: 1/10 now, 7/10 worst Pain location: R shoulder Pain description: Achy, dull, sore Aggravating factors: Reaching overhead, reaching forward, lifting objects Relieving factors: Ice  PRECAUTIONS: None  RED FLAGS: None   WEIGHT BEARING RESTRICTIONS: No  FALLS:  Has patient fallen in last 6 months?  No  LIVING ENVIRONMENT: Lives with: lives alone Lives in:  House/apartment Stairs: Yes: External: 4 steps; can reach both Has following equipment at home: Dan Humphreys - 4 wheeled  OCCUPATION: Retired Runner, broadcasting/film/video; does a lot of yard-work and volunteering for her church  PLOF: Independent  PATIENT GOALS: Be able to perform all yardwork/housework and community activities with no pain  NEXT MD VISIT: TBD  OBJECTIVE:  Note: Objective measures were completed at Evaluation unless otherwise noted.  DIAGNOSTIC FINDINGS:  N/A  PATIENT SURVEYS:  FOTO 46/64  COGNITION: Overall cognitive status: Within functional limits for tasks assessed     SENSATION: WFL  POSTURE: Rounded shoulders bilaterall  UPPER EXTREMITY ROM:   Active ROM Right eval Left eval  Shoulder flexion 161 ! 172  Shoulder extension [facility]  Shoulder abduction 121 ! 144  Shoulder adduction    Shoulder internal rotation 81 ! 74  Shoulder external rotation 71 ! 84  Elbow flexion WFL WFL  Elbow extension [facility] WFL  Wrist flexion    Wrist extension    Wrist ulnar deviation    Wrist radial deviation    Wrist pronation    Wrist supination    (Blank rows = not tested)  UPPER EXTREMITY MMT:  MMT Right eval Left eval  Shoulder flexion 4- ! 5  Shoulder extension 5 ! 5  Shoulder abduction 4- ! 5  Shoulder adduction    Shoulder internal rotation 5 5  Shoulder external rotation 3+ ! 5  Middle trapezius    Lower trapezius    Elbow flexion 5 5  Elbow extension 5 5  Wrist flexion    Wrist extension    Wrist ulnar deviation    Wrist radial deviation    Wrist pronation    Wrist supination    Grip strength (lbs)    (Blank rows = not tested)  SHOULDER SPECIAL TESTS: Impingement tests: Neer impingement test: positive , Hawkins/Kennedy impingement test: positive , and Painful arc test: positive  Biceps assessment: Speed's test: positive   JOINT MOBILITY TESTING:  To be assessed next session  PALPATION:  TTP  along R clavicle as well as near biceps tendon insertion site   TODAY'S TREATMENT:                                                                                                                                         DATE: 09/01/2023   PATIENT EDUCATION: Education details: HEP form and frequency and the importance of keeping exercises pain-free, role of PT, plan of care Person educated: Patient Education method: Explanation, Demonstration, and Handouts Education comprehension: returned demonstration  HOME EXERCISE PROGRAM: Access Code: [email or web address] URL: [email or web address] Date: 08/31/2023 Prepared by: Maylon Peppers  Exercises - Doorway Pec Stretch at 90 Degrees Abduction  - 2-3 x daily - 7 x weekly - 3 sets - 10 reps - 30 seconds hold - Standing Isometric Shoulder Flexion with Doorway  - 2-3 x daily - 5-7 x weekly -  3 sets - 10 reps - 2-3 seconds hold - Standing Isometric Shoulder Abduction with Doorway  - 2-3 x daily - 5-7 x weekly - 3 sets - 10 reps - 2-3 seconds hold - Standing Isometric Shoulder Internal Rotation at Doorway  - 2-3 x daily - 5-7 x weekly - 3 sets - 10 reps - 2-3 seconds hold - Standing Isometric Shoulder External Rotation with Doorway  - 2-3 x daily - 5-7 x weekly - 3 sets - 10 reps - 2-3 seconds hold  ASSESSMENT:  CLINICAL IMPRESSION: Patient is a 67 y.o. female who was seen today for physical therapy evaluation and treatment for chronic R shoulder pain. Pt is currently limited at this time due to the following impairments: decreased R shoulder AROM secondary to pain, decreased R shoulder strength, poor sitting/standing posture. Pt's symptoms are consistent with impingement syndrome based on (+) testing on HK, Neer's and Painful Arc; pt also has some mild biceps tendon involvement based on (+) Speed's Test. Pt will benefit from continued PT services upon discharge to safely address deficits listed in patient problem list for decreased caregiver  assistance and eventual return to PLOF.   OBJECTIVE IMPAIRMENTS: decreased ROM, decreased strength, hypomobility, impaired UE functional use, postural dysfunction, and pain.   ACTIVITY LIMITATIONS: lifting, reach over head, hygiene/grooming, and caring for others  PARTICIPATION LIMITATIONS: cleaning, laundry, community activity, yard work, and church  PERSONAL FACTORS: Age, Time since onset of injury/illness/exacerbation, and 3+ comorbidities: Crohn's disease, hx of MI's, osteopenia  are also affecting patient's functional outcome.   REHAB POTENTIAL: Good  CLINICAL DECISION MAKING: Stable/uncomplicated  EVALUATION COMPLEXITY: Low   GOALS:  SHORT TERM GOALS: Target date: 09/28/2023  Pt will be independent and compliant with HEP in order to improve R shoulder AROM by at least 10 degrees in all planes. Baseline: See above for initial R shoulder AROM measurements Goal status: INITIAL   LONG TERM GOALS: Target date: 10/26/2023  Pt will improve FOTO to at least 64 in order to demonstrate improvement in pain and function with ADL's. Baseline: 46 Goal status: INITIAL  2.  Pt will demonstrate full and pain-free R shoulder AROM in all planes in order to improve function with overhead reaching and lifting tasks at home. Baseline: See above for initial R shoulder AROM measurements Goal status: INITIAL  3.  Pt will increase all R shoulder MMT scores to at least 4+/5 with no pain in order to show improved strength needed to perform yardwork and church activities. Baseline: See above for initial MMT scores Goal status: INITIAL  4.  Pt will demonstrate negative impingement and Speed's tests in order to show improvement in R shoulder pain. Baseline: (+) HK, Neer's, Painful Arc, Speed's Goal status: INITIAL  5.  Pt will demonstrate symmetrical grip strength in order to improve function with yard-work and church volunteering tasks. Baseline: To be assessed next session Goal status:  INITIAL   PLAN:  PT FREQUENCY: 2x/week  PT DURATION: 8 weeks  PLANNED INTERVENTIONS: 97110-Therapeutic exercises, 97530- Therapeutic activity, 97112- Neuromuscular re-education, 97140- Manual therapy, Joint mobilization, Cryotherapy, and Moist heat  PLAN FOR NEXT SESSION: Assess R shoulder PROM, shoulder PAM's, grip strength assessment, review HEP   Muscab Brenneman, SPT  Maylon Peppers, PT, DPT Physical Therapist - [place]  [facility] 09/01/23, 11:55 AM

## 2023-09-02 ENCOUNTER — Encounter: Payer: Medicare PPO | Admitting: Physical Therapy

## 2023-09-07 ENCOUNTER — Encounter: Payer: Self-pay | Admitting: Physical Therapy

## 2023-09-07 ENCOUNTER — Ambulatory Visit: Payer: Medicare PPO | Admitting: Physical Therapy

## 2023-09-07 DIAGNOSIS — G8929 Other chronic pain: Secondary | ICD-10-CM

## 2023-09-07 DIAGNOSIS — M7541 Impingement syndrome of right shoulder: Secondary | ICD-10-CM

## 2023-09-07 DIAGNOSIS — M25611 Stiffness of right shoulder, not elsewhere classified: Secondary | ICD-10-CM

## 2023-09-07 DIAGNOSIS — R29898 Other symptoms and signs involving the musculoskeletal system: Secondary | ICD-10-CM

## 2023-09-07 DIAGNOSIS — M25511 Pain in right shoulder: Secondary | ICD-10-CM | POA: Diagnosis not present

## 2023-09-07 NOTE — Therapy (Unsigned)
OUTPATIENT PHYSICAL THERAPY SHOULDER TREATMENT   Patient Name: Morgan Moore MRN: [id number] DOB:1956/07/14, 67 y.o., female Today's Date: 09/07/2023  END OF SESSION:  PT End of Session - 09/07/23 1557     Visit Number 2    Number of Visits 16    Date for PT Re-Evaluation 10/26/23    PT Start Time 1559    PT Stop Time 1645    PT Time Calculation (min) 46 min    Activity Tolerance Patient limited by pain    Behavior During Therapy [facility] for tasks assessed/performed              Past Medical History:  Diagnosis Date   Allergy    Anemia    Anxiety    Chicken pox    Complication of anesthesia    vomitting   Coronary artery disease    Crohn disease (HCC)    DVT (deep venous thrombosis) (HCC) 2017   Dyspnea    on excertion   Dysrhythmia 2019   palpitations   GERD (gastroesophageal reflux disease)    Heart disease    Heart murmur    Heart murmur    History of kidney stones    Hyperlipidemia    Hypertension    Inflammatory bowel disease    Migraines    hormonal, puberty   Myocardial infarction (HCC)    Phlebitis    PONV (postoperative nausea and vomiting)    Past Surgical History:  Procedure Laterality Date   BREAST BIOPSY Left 07/05/2019   FIBROEPITHELIAL lesion/neg   BREAST CYST ASPIRATION     unsure of side   CARPAL TUNNEL RELEASE Right 12/02/2017   Procedure: CARPAL TUNNEL RELEASE ENDOSCOPIC;  Surgeon: Christena Flake, MD;  Location: [facility] ORS;  Service: Orthopedics;  Laterality: Right;   CORONARY ANGIOPLASTY WITH STENT PLACEMENT  2017   x 2   EYE SURGERY Bilateral    cataract surgery   GANGLION CYST EXCISION Right 12/02/2017   Procedure: REMOVAL GANGLION OF WRIST;  Surgeon: Christena Flake, MD;  Location: [facility] ORS;  Service: Orthopedics;  Laterality: Right;   heart murmur     HYSTEROSCOPY WITH D & C N/A 04/19/2020   Procedure: DILATATION AND CURETTAGE /HYSTEROSCOPY, POSSIBLE  POLYPECTOMY;  Surgeon: Christeen Douglas, MD;  Location: [facility] ORS;  Service:  Gynecology;  Laterality: N/A;   TONSILLECTOMY AND ADENOIDECTOMY  1962   TUBAL LIGATION  1993   tubaligation  1990   Patient Active Problem List   Diagnosis Date Noted   Knee pain 11/08/2022   Tremor 11/08/2022   Ankle pain 04/12/2022   Chronic venous insufficiency 04/08/2022   Major depressive disorder, single episode, mild (HCC) 02/14/2022   Groin hematoma 02/14/2022   Enlarged lymph node in neck 10/12/2021   Aortic atherosclerosis (HCC) 10/12/2021   History of COVID-19 08/09/2021   Abdominal pain 11/30/2020   Toenail fungus 11/24/2020   Back pain 11/24/2020   Carotid artery disease (HCC) 07/28/2020   Leg skin lesion, right 03/24/2020   Shingles 12/10/2019   Abnormal mammogram 12/02/2019   Thickened endometrium 10/29/2019   Hematochezia 11/11/2018   Rectal polyp 10/28/2018   Vitamin D deficiency 01/18/2018   Iron deficiency anemia secondary to inadequate dietary iron intake 01/17/2018   Easy bruising 12/23/2017   Anemia 12/23/2017   Flexor carpi ulnaris tenosynovitis 12/03/2017   Carpal tunnel syndrome, right 10/15/2017   Ganglion of right wrist 10/15/2017   Leg length inequality 10/15/2017   Lumbar spondylosis 10/15/2017   Osteoporosis without  current pathological fracture 09/09/2017   Heart murmur 09/09/2017   Mild depression 09/09/2017   Crohn disease (HCC)    Chest pain 04/12/2017   Right shoulder pain 01/16/2017   History of frequent urinary tract infections 08/09/2016   Heart palpitations 08/03/2016   Weakness 07/02/2016   Light headedness 04/17/2016   Coronary artery disease of native artery of native heart with stable angina pectoris (HCC)    Hip pain 04/13/2016   Health care maintenance 01/19/2016   Essential hypertension 11/07/2015   Hypercholesterolemia 11/07/2015   Migraine headache 11/07/2015   Nephrolithiasis 11/07/2015   GERD (gastroesophageal reflux disease) 11/07/2015   Mitral regurgitation 11/07/2015   Stress 11/07/2015   Varicose veins of  both lower extremities with pain 05/10/2015   Pharyngoesophageal dysphagia 03/11/2015   Pancreatic lesion 04/25/2014   Hemangioma of liver 03/12/2014   Arthralgia 05/29/2011    PCP/REFERRING PROVIDER: Dale Roseland, MD  REFERRING DIAG: Right shoulder pain, unspecified chronicity  THERAPY DIAG:  Chronic right shoulder pain  Impingement syndrome of right shoulder  Weakness of right shoulder  Stiffness of right shoulder, not elsewhere classified  Rationale for Evaluation and Treatment: Rehabilitation  ONSET DATE: August 2023  SUBJECTIVE:                                                                                                                                                                                      SUBJECTIVE STATEMENT: Pt reports to PT with c/c of chronic R shoulder pain. She reports helping her family set up a venue for a wedding and feeling like she "overdid" it with her shoulder; pt also reports continuing to do a lot of house and yard work with her shoulder which she feels didn't help the pain. She reports feeling "tingling" in her fingers however says she has had that for a while and it isn't a new occurrence since her shoulder pain started. Pt denies any previous episodes however says she did fracture her R shoulder a few years ago from a fall. No significant change in 24 hour behavior but pt reports her shoulder feels more stiff/painful after extended periods of rest. Hand dominance: Right  PERTINENT HISTORY: L meniscus tear, Crohn's disease, hx of 3 MI's and 5 stent placements (2022 most recent), Osteopenia  PAIN:  Are you having pain? Yes: NPRS scale: 1/10 now, 7/10 worst Pain location: R shoulder Pain description: Achy, dull, sore Aggravating factors: Reaching overhead, reaching forward, lifting objects Relieving factors: Ice  PRECAUTIONS: None  RED FLAGS: None   WEIGHT BEARING RESTRICTIONS: No  FALLS:  Has patient fallen in last 6 months?  No  LIVING ENVIRONMENT: Lives with: lives alone Lives  in: House/apartment Stairs: Yes: External: 4 steps; can reach both Has following equipment at home: Dan Humphreys - 4 wheeled  OCCUPATION: Retired Runner, broadcasting/film/video; does a lot of yard-work and volunteering for her church  PLOF: Independent  PATIENT GOALS: Be able to perform all yardwork/housework and community activities with no pain  NEXT MD VISIT: TBD  OBJECTIVE:  Note: Objective measures were completed at Evaluation unless otherwise noted.  DIAGNOSTIC FINDINGS:  N/A  PATIENT SURVEYS:  FOTO 46/64  COGNITION: Overall cognitive status: Within functional limits for tasks assessed     SENSATION: WFL  POSTURE: Rounded shoulders bilaterall  UPPER EXTREMITY ROM:   Active ROM Right eval Left eval  Shoulder flexion 161 ! 172  Shoulder extension [facility]  Shoulder abduction 121 ! 144  Shoulder adduction    Shoulder internal rotation 81 ! 74  Shoulder external rotation 71 ! 84  Elbow flexion WFL WFL  Elbow extension [facility] WFL  Wrist flexion    Wrist extension    Wrist ulnar deviation    Wrist radial deviation    Wrist pronation    Wrist supination    (Blank rows = not tested)  UPPER EXTREMITY MMT:  MMT Right eval Left eval  Shoulder flexion 4- ! 5  Shoulder extension 5 ! 5  Shoulder abduction 4- ! 5  Shoulder adduction    Shoulder internal rotation 5 5  Shoulder external rotation 3+ ! 5  Middle trapezius    Lower trapezius    Elbow flexion 5 5  Elbow extension 5 5  Wrist flexion    Wrist extension    Wrist ulnar deviation    Wrist radial deviation    Wrist pronation    Wrist supination    Grip strength (lbs)    (Blank rows = not tested)  SHOULDER SPECIAL TESTS: Impingement tests: Neer impingement test: positive , Hawkins/Kennedy impingement test: positive , and Painful arc test: positive  Biceps assessment: Speed's test: positive   JOINT MOBILITY TESTING:  To be assessed next session  PALPATION:  TTP  along R clavicle as well as near biceps tendon insertion site   TODAY'S TREATMENT:                                                                                                                                         DATE: 09/07/2023  Subjective: Pt reports 0/10 pain in her R shoulder now, 3-4/10 pain with movement, particularly overhead movement. Pt reports HEP has been going well. She says she had to do a lot of yardwork yesterday which she feels like flared things up a little.  Objective: UPPER EXTREMITY ROM:  Passive ROM Right eval Left eval  Shoulder flexion 161 172  Shoulder extension    Shoulder abduction 136!  168  Shoulder adduction    Shoulder extension    Shoulder internal rotation WFL 76  Shoulder external rotation WFL 83  Elbow flexion    Elbow extension    Wrist flexion    Wrist extension    Wrist ulnar deviation    Wrist radial deviation    Wrist pronation    Wrist supination     (Blank rows = not tested)   Full Can/Empty Can: (+) on RUE  ER Lag sign: (-) bilaterally  Grip strength: R: 50.5, 52.8, 51.5 (51.6 lbs average) L: 42.6, 49.1, 52 (47.9 lbs average)  Pt educated on importance of activity modification with housework/yardwork, as well as on keeping HEP pain-free  PATIENT EDUCATION: Education details: HEP form and frequency and the importance of keeping exercises pain-free, role of PT, plan of care Person educated: Patient Education method: Explanation, Demonstration, and Handouts Education comprehension: returned demonstration  HOME EXERCISE PROGRAM: Access Code: [email or web address] URL: [email or web address] Date: 08/31/2023 Prepared by: Maylon Peppers  Exercises - Doorway Pec Stretch at 90 Degrees Abduction  - 2-3 x daily - 7 x weekly - 3 sets - 10 reps - 30 seconds hold - Standing Isometric Shoulder Flexion with Doorway  - 2-3 x daily - 5-7 x weekly - 3 sets - 10 reps - 2-3 seconds hold - Standing Isometric Shoulder Abduction with  Doorway  - 2-3 x daily - 5-7 x weekly - 3 sets - 10 reps - 2-3 seconds hold - Standing Isometric Shoulder Internal Rotation at Doorway  - 2-3 x daily - 5-7 x weekly - 3 sets - 10 reps - 2-3 seconds hold - Standing Isometric Shoulder External Rotation with Doorway  - 2-3 x daily - 5-7 x weekly - 3 sets - 10 reps - 2-3 seconds hold  ASSESSMENT:  CLINICAL IMPRESSION: Pt reports to PT with no significant changes in R shoulder pain since the initial evaluation. Pt's R shoulder PROM was assessed today; flexion measurements were equal compared to AROM; abduction was limited actively compared to passively; no significant difference in ER/IR measurements. Pt also demonstrated (+) Full Can and Empty Can tests, increasing the likelihood that she is dealing with rotator cuff pathology. Pt's grip strength testing was [facility] for both sides; pt experienced increased pain with testing on the R side. The majority of today's session consisted of pt education on the importance of activity modification with her housework and yardwork. Pt was educated on the pathoanatomy of her condition as well as on the importance of keeping her HEP stretches/exercises pain-free. Pt instructed to continue icing her shoulder and trying to ease her activity levels down to allow the inflammation and irritation in the area to die down. LTG's updated to reflect today's assessments. Pt will benefit from continued PT services upon discharge to safely address deficits listed in patient problem list for decreased caregiver assistance and eventual return to PLOF.   OBJECTIVE IMPAIRMENTS: decreased ROM, decreased strength, hypomobility, impaired UE functional use, postural dysfunction, and pain.   ACTIVITY LIMITATIONS: lifting, reach over head, hygiene/grooming, and caring for others  PARTICIPATION LIMITATIONS: cleaning, laundry, community activity, yard work, and church  PERSONAL FACTORS: Age, Time since onset of injury/illness/exacerbation, and 3+  comorbidities: Crohn's disease, hx of MI's, osteopenia  are also affecting patient's functional outcome.   REHAB POTENTIAL: Good  CLINICAL DECISION MAKING: Stable/uncomplicated  EVALUATION COMPLEXITY: Low   GOALS:  SHORT TERM GOALS: Target date: 09/28/2023  Pt will be independent and compliant with HEP in order to improve R shoulder AROM by at least 10 degrees in all planes. Baseline: See above for initial R shoulder AROM measurements Goal status: INITIAL  LONG TERM GOALS: Target date: 10/26/2023  Pt will improve FOTO to at least 64 in order to demonstrate improvement in pain and function with ADL's. Baseline: 46 Goal status: INITIAL  2.  Pt will demonstrate full and pain-free R shoulder AROM in all planes in order to improve function with overhead reaching and lifting tasks at home. Baseline: See above for initial R shoulder AROM measurements Goal status: INITIAL  3.  Pt will increase all R shoulder MMT scores to at least 4+/5 with no pain in order to show improved strength needed to perform yardwork and church activities. Baseline: See above for initial MMT scores Goal status: INITIAL  4.  Pt will demonstrate negative impingement and Speed's tests in order to show improvement in R shoulder pain. Baseline: (+) HK, Neer's, Painful Arc, Speed's Goal status: INITIAL  5.  Pt will demonstrate negative Full/Empty Can testing in order to show improvement in rotator cuff pain/pathology. Baseline: (+) full/empty can on R side (10/29) Goal status: INITIAL   PLAN:  PT FREQUENCY: 2x/week  PT DURATION: 8 weeks  PLANNED INTERVENTIONS: 97110-Therapeutic exercises, 97530- Therapeutic activity, 97112- Neuromuscular re-education, 97140- Manual therapy, Joint mobilization, Cryotherapy, and Moist heat  PLAN FOR NEXT SESSION: shoulder PAM's, review HEP, follow-up on activity modification, introduce shoulder pendulum's/pulley and resisted rotator cuff strengthening exercises (isometric  step-outs, resisted ER)  Cammie Mcgee, PT, DPT # [phone] Cena Benton, SPT 09/07/23, 5:50 PM

## 2023-09-10 ENCOUNTER — Ambulatory Visit: Payer: Medicare PPO | Attending: Internal Medicine | Admitting: Physical Therapy

## 2023-09-10 ENCOUNTER — Encounter: Payer: Self-pay | Admitting: Physical Therapy

## 2023-09-10 DIAGNOSIS — M7541 Impingement syndrome of right shoulder: Secondary | ICD-10-CM

## 2023-09-10 DIAGNOSIS — R29898 Other symptoms and signs involving the musculoskeletal system: Secondary | ICD-10-CM | POA: Diagnosis not present

## 2023-09-10 DIAGNOSIS — G8929 Other chronic pain: Secondary | ICD-10-CM

## 2023-09-10 DIAGNOSIS — M25511 Pain in right shoulder: Secondary | ICD-10-CM | POA: Diagnosis not present

## 2023-09-10 DIAGNOSIS — M25611 Stiffness of right shoulder, not elsewhere classified: Secondary | ICD-10-CM | POA: Diagnosis not present

## 2023-09-10 NOTE — Therapy (Signed)
OUTPATIENT PHYSICAL THERAPY SHOULDER TREATMENT   Patient Name: Morgan Moore MRN: [id number] DOB:[birth year], 67 y.o., female Today's Date: 09/10/2023  END OF SESSION:  PT End of Session - 09/10/23 1033     Visit Number 3    Number of Visits 16    Date for PT Re-Evaluation 10/26/23    PT Start Time 1033    PT Stop Time 1114    PT Time Calculation (min) 41 min    Activity Tolerance Patient tolerated treatment well    Behavior During Therapy WFL for tasks assessed/performed               Past Medical History:  Diagnosis Date   Allergy    Anemia    Anxiety    Chicken pox    Complication of anesthesia    vomitting   Coronary artery disease    Crohn disease (HCC)    DVT (deep venous thrombosis) (HCC) 2017   Dyspnea    on excertion   Dysrhythmia 2019   palpitations   GERD (gastroesophageal reflux disease)    Heart disease    Heart murmur    Heart murmur    History of kidney stones    Hyperlipidemia    Hypertension    Inflammatory bowel disease    Migraines    hormonal, puberty   Myocardial infarction (HCC)    Phlebitis    PONV (postoperative nausea and vomiting)    Past Surgical History:  Procedure Laterality Date   BREAST BIOPSY Left 07/05/2019   FIBROEPITHELIAL lesion/neg   BREAST CYST ASPIRATION     unsure of side   CARPAL TUNNEL RELEASE Right 12/02/2017   Procedure: CARPAL TUNNEL RELEASE ENDOSCOPIC;  Surgeon: Christena Flake, MD;  Location: [facility];  Service: Orthopedics;  Laterality: Right;   CORONARY ANGIOPLASTY WITH STENT PLACEMENT  2017   x 2   EYE SURGERY Bilateral    cataract surgery   GANGLION CYST EXCISION Right 12/02/2017   Procedure: REMOVAL GANGLION OF WRIST;  Surgeon: Christena Flake, MD;  Location: [facility];  Service: Orthopedics;  Laterality: Right;   heart murmur     HYSTEROSCOPY WITH D & C N/A 04/19/2020   Procedure: DILATATION AND CURETTAGE /HYSTEROSCOPY, POSSIBLE  POLYPECTOMY;  Surgeon: Christeen Douglas, MD;  Location: [facility];   Service: Gynecology;  Laterality: N/A;   TONSILLECTOMY AND ADENOIDECTOMY  1962   TUBAL LIGATION  1993   tubaligation  1990   Patient Active Problem List   Diagnosis Date Noted   Knee pain 11/08/2022   Tremor 11/08/2022   Ankle pain 04/12/2022   Chronic venous insufficiency 04/08/2022   Major depressive disorder, single episode, mild (HCC) 02/14/2022   Groin hematoma 02/14/2022   Enlarged lymph node in neck 10/12/2021   Aortic atherosclerosis (HCC) 10/12/2021   History of COVID-19 08/09/2021   Abdominal pain 11/30/2020   Toenail fungus 11/24/2020   Back pain 11/24/2020   Carotid artery disease (HCC) 07/28/2020   Leg skin lesion, right 03/24/2020   Shingles 12/10/2019   Abnormal mammogram 12/02/2019   Thickened endometrium 10/29/2019   Hematochezia 11/11/2018   Rectal polyp 10/28/2018   Vitamin D deficiency 01/18/2018   Iron deficiency anemia secondary to inadequate dietary iron intake 01/17/2018   Easy bruising 12/23/2017   Anemia 12/23/2017   Flexor carpi ulnaris tenosynovitis 12/03/2017   Carpal tunnel syndrome, right 10/15/2017   Ganglion of right wrist 10/15/2017   Leg length inequality 10/15/2017   Lumbar spondylosis 10/15/2017   Osteoporosis  without current pathological fracture 09/09/2017   Heart murmur 09/09/2017   Mild depression 09/09/2017   Crohn disease (HCC)    Chest pain 04/12/2017   Right shoulder pain 01/16/2017   History of frequent urinary tract infections 08/09/2016   Heart palpitations 08/03/2016   Weakness 07/02/2016   Light headedness 04/17/2016   Coronary artery disease of native artery of native heart with stable angina pectoris (HCC)    Hip pain 04/13/2016   Health care maintenance 01/19/2016   Essential hypertension 11/07/2015   Hypercholesterolemia 11/07/2015   Migraine headache 11/07/2015   Nephrolithiasis 11/07/2015   GERD (gastroesophageal reflux disease) 11/07/2015   Mitral regurgitation 11/07/2015   Stress 11/07/2015   Varicose  veins of both lower extremities with pain 05/10/2015   Pharyngoesophageal dysphagia 03/11/2015   Pancreatic lesion 04/25/2014   Hemangioma of liver 03/12/2014   Arthralgia 05/29/2011    PCP/REFERRING PROVIDER: Dale La Grande, MD  REFERRING DIAG: Right shoulder pain, unspecified chronicity  THERAPY DIAG:  Chronic right shoulder pain  Impingement syndrome of right shoulder  Weakness of right shoulder  Stiffness of right shoulder, not elsewhere classified  Rationale for Evaluation and Treatment: Rehabilitation  ONSET DATE: August 2023  SUBJECTIVE:                                                                                                                                                                                      SUBJECTIVE STATEMENT: Pt reports to PT with c/c of chronic R shoulder pain. She reports helping her family set up a venue for a wedding and feeling like she "overdid" it with her shoulder; pt also reports continuing to do a lot of house and yard work with her shoulder which she feels didn't help the pain. She reports feeling "tingling" in her fingers however says she has had that for a while and it isn't a new occurrence since her shoulder pain started. Pt denies any previous episodes however says she did fracture her R shoulder a few years ago from a fall. No significant change in 24 hour behavior but pt reports her shoulder feels more stiff/painful after extended periods of rest. Hand dominance: Right  PERTINENT HISTORY: L meniscus tear, Crohn's disease, hx of 3 MI's and 5 stent placements (2022 most recent), Osteopenia  PAIN:  Are you having pain? Yes: NPRS scale: 1/10 now, 7/10 worst Pain location: R shoulder Pain description: Achy, dull, sore Aggravating factors: Reaching overhead, reaching forward, lifting objects Relieving factors: Ice  PRECAUTIONS: None  RED FLAGS: None   WEIGHT BEARING RESTRICTIONS: No  FALLS:  Has patient fallen in last 6  months? No  LIVING ENVIRONMENT: Lives with: lives alone  Lives in: House/apartment Stairs: Yes: External: 4 steps; can reach both Has following equipment at home: Dan Humphreys - 4 wheeled  OCCUPATION: Retired Runner, broadcasting/film/video; does a lot of yard-work and volunteering for her church  PLOF: Independent  PATIENT GOALS: Be able to perform all yardwork/housework and community activities with no pain  NEXT MD VISIT: TBD  OBJECTIVE:  Note: Objective measures were completed at Evaluation unless otherwise noted.  DIAGNOSTIC FINDINGS:  N/A  PATIENT SURVEYS:  FOTO 46/64  COGNITION: Overall cognitive status: Within functional limits for tasks assessed     SENSATION: WFL  POSTURE: Rounded shoulders bilaterall  UPPER EXTREMITY ROM:   Active ROM Right eval Left eval  Shoulder flexion 161 ! 172  Shoulder extension [facility]  Shoulder abduction 121 ! 144  Shoulder adduction    Shoulder internal rotation 81 ! 74  Shoulder external rotation 71 ! 84  Elbow flexion WFL WFL  Elbow extension [facility] WFL  Wrist flexion    Wrist extension    Wrist ulnar deviation    Wrist radial deviation    Wrist pronation    Wrist supination    (Blank rows = not tested)  UPPER EXTREMITY MMT:  MMT Right eval Left eval  Shoulder flexion 4- ! 5  Shoulder extension 5 ! 5  Shoulder abduction 4- ! 5  Shoulder adduction    Shoulder internal rotation 5 5  Shoulder external rotation 3+ ! 5  Middle trapezius    Lower trapezius    Elbow flexion 5 5  Elbow extension 5 5  Wrist flexion    Wrist extension    Wrist ulnar deviation    Wrist radial deviation    Wrist pronation    Wrist supination    Grip strength (lbs)    (Blank rows = not tested)  SHOULDER SPECIAL TESTS: Impingement tests: Neer impingement test: positive , Hawkins/Kennedy impingement test: positive , and Painful arc test: positive  Biceps assessment: Speed's test: positive   JOINT MOBILITY TESTING:  To be assessed next session  PALPATION:   TTP along R clavicle as well as near biceps tendon insertion site   TODAY'S TREATMENT:                                                                                                                                         DATE: 09/10/2023  Subjective: Pt reports with no pain at rest; she still experiences the most pain with overhead reaching. She also reports experiencing numbness and tingling in both arms occasionally when talking on the phone or driving for long periods of time.  Objective: Pt educated again on importance of activity modification and taking rest/ice breaks when she feels pain in her R shoulder  Therex: ULTT1: (+) bilaterally  ULTT3: (+) bilaterally  ULTT2: (-) bilaterally   New Isometric HEP reviewed  Seated Ulnar/Radial Nerve Glide   Standing Pendulum's: 30 second holds each  arm  Seated Resisted Supination: yellow band, 1x10    Manual Therapy: Supine R Inferior GHJ mobilizations: grades 1-2 x 5 minutes to decrease pain  PATIENT EDUCATION: Education details: HEP form and frequency and the importance of keeping exercises pain-free, role of PT, plan of care Person educated: Patient Education method: Explanation, Demonstration, and Handouts Education comprehension: returned demonstration  HOME EXERCISE PROGRAM: Access Code: [email or web address] URL: [email or web address] Date: 09/10/2023 Prepared by: Dorene Grebe Exercises - Doorway Pec Stretch at 90 Degrees Abduction - 2 x daily - 4-5 x weekly - 2 sets - 10 reps - 30 seconds hold - Standing Isometric Shoulder Flexion with Doorway - 2 x daily - 4-5 x weekly - 2 sets - 10 reps - 2-3 seconds hold - Standing Isometric Shoulder Abduction with Doorway - 2 x daily - 4-5 x weekly - 2 sets - 10 reps - 2-3 seconds hold - Standing Isometric Shoulder Internal Rotation at Doorway - 2 x daily - 4-5 x weekly - 2 sets - 10 reps - 2-3 seconds hold - Standing Isometric Shoulder External Rotation with Doorway - 2 x daily - 4-5  x weekly - 2 sets - 10 reps - 2-3 seconds hold - Forearm Supination with Resistance - 2 x daily - 4-5 x weekly - 2 sets - 10 reps - Standing Median Nerve Glide - 2 x daily - 7 x weekly - 2 sets - 10 reps - Ulnar Nerve Flossing - 2 x daily - 7 x weekly - 2 sets - 10 reps   ASSESSMENT:  CLINICAL IMPRESSION: Pt reports to PT with no significant changes in R shoulder pain since last session. New isometric HEP reviewed at start to ensure pt is still performing them with no pain and with correct form. Pt reported experiencing numbness/tingling intermittently in both Ue's so upper limb tension tests assessed as well; pt likely has median and ulnar nerve involvement in both arms, pt given nerve glides as part of HEP to improve her pain. Exercises performed at end of session aimed to improve pt's R bicep strength without increasing her pain. Pt educated again at end of session on importance of activity modification and use of ice/rest at home to alleviate symptoms. Pt will benefit from continued PT services upon discharge to safely address deficits listed in patient problem list for decreased caregiver assistance and eventual return to PLOF.   OBJECTIVE IMPAIRMENTS: decreased ROM, decreased strength, hypomobility, impaired UE functional use, postural dysfunction, and pain.   ACTIVITY LIMITATIONS: lifting, reach over head, hygiene/grooming, and caring for others  PARTICIPATION LIMITATIONS: cleaning, laundry, community activity, yard work, and church  PERSONAL FACTORS: Age, Time since onset of injury/illness/exacerbation, and 3+ comorbidities: Crohn's disease, hx of MI's, osteopenia  are also affecting patient's functional outcome.   REHAB POTENTIAL: Good  CLINICAL DECISION MAKING: Stable/uncomplicated  EVALUATION COMPLEXITY: Low   GOALS:  SHORT TERM GOALS: Target date: 09/28/2023  Pt will be independent and compliant with HEP in order to improve R shoulder AROM by at least 10 degrees in all  planes. Baseline: See above for initial R shoulder AROM measurements Goal status: INITIAL   LONG TERM GOALS: Target date: 10/26/2023  Pt will improve FOTO to at least 64 in order to demonstrate improvement in pain and function with ADL's. Baseline: 46 Goal status: INITIAL  2.  Pt will demonstrate full and pain-free R shoulder AROM in all planes in order to improve function with overhead reaching and lifting tasks at home. Baseline: See above for initial  R shoulder AROM measurements Goal status: INITIAL  3.  Pt will increase all R shoulder MMT scores to at least 4+/5 with no pain in order to show improved strength needed to perform yardwork and church activities. Baseline: See above for initial MMT scores Goal status: INITIAL  4.  Pt will demonstrate negative impingement and Speed's tests in order to show improvement in R shoulder pain. Baseline: (+) HK, Neer's, Painful Arc, Speed's Goal status: INITIAL  5.  Pt will demonstrate negative Full/Empty Can testing in order to show improvement in rotator cuff pain/pathology. Baseline: (+) full/empty can on R side (10/29) Goal status: INITIAL   PLAN:  PT FREQUENCY: 2x/week  PT DURATION: 8 weeks  PLANNED INTERVENTIONS: 97110-Therapeutic exercises, 97530- Therapeutic activity, 97112- Neuromuscular re-education, 97140- Manual therapy, Joint mobilization, Cryotherapy, and Moist heat  PLAN FOR NEXT SESSION: shoulder PAM's, review HEP, follow-up on activity modification, introduce shoulder resisted rotator cuff strengthening exercises (isometric step-outs, resisted ER), follow-up on resisted supination exercse and pendulumb's  Cammie Mcgee, PT, DPT # [phone] Cena Benton, SPT 09/10/23, 11:22 AM

## 2023-09-13 ENCOUNTER — Encounter: Payer: Self-pay | Admitting: Physical Therapy

## 2023-09-13 ENCOUNTER — Ambulatory Visit: Payer: Medicare PPO | Attending: Internal Medicine | Admitting: Physical Therapy

## 2023-09-13 DIAGNOSIS — R29898 Other symptoms and signs involving the musculoskeletal system: Secondary | ICD-10-CM | POA: Diagnosis not present

## 2023-09-13 DIAGNOSIS — M25611 Stiffness of right shoulder, not elsewhere classified: Secondary | ICD-10-CM | POA: Insufficient documentation

## 2023-09-13 DIAGNOSIS — M7541 Impingement syndrome of right shoulder: Secondary | ICD-10-CM | POA: Diagnosis not present

## 2023-09-13 DIAGNOSIS — M25511 Pain in right shoulder: Secondary | ICD-10-CM | POA: Diagnosis not present

## 2023-09-13 DIAGNOSIS — G8929 Other chronic pain: Secondary | ICD-10-CM | POA: Diagnosis not present

## 2023-09-13 NOTE — Therapy (Unsigned)
OUTPATIENT PHYSICAL THERAPY SHOULDER TREATMENT   Patient Name: Morgan Moore MRN: [id number] DOB:[birth year], 67 y.o., female Today's Date: 09/13/2023  END OF SESSION:  PT End of Session - 09/13/23 1612     Visit Number 4    Number of Visits 16    Date for PT Re-Evaluation 10/26/23    PT Start Time 1609    PT Stop Time 1645    PT Time Calculation (min) 36 min    Activity Tolerance Patient tolerated treatment well    Behavior During Therapy WFL for tasks assessed/performed                Past Medical History:  Diagnosis Date   Allergy    Anemia    Anxiety    Chicken pox    Complication of anesthesia    vomitting   Coronary artery disease    Crohn disease (HCC)    DVT (deep venous thrombosis) (HCC) 2017   Dyspnea    on excertion   Dysrhythmia 2019   palpitations   GERD (gastroesophageal reflux disease)    Heart disease    Heart murmur    Heart murmur    History of kidney stones    Hyperlipidemia    Hypertension    Inflammatory bowel disease    Migraines    hormonal, puberty   Myocardial infarction (HCC)    Phlebitis    PONV (postoperative nausea and vomiting)    Past Surgical History:  Procedure Laterality Date   BREAST BIOPSY Left 07/05/2019   FIBROEPITHELIAL lesion/neg   BREAST CYST ASPIRATION     unsure of side   CARPAL TUNNEL RELEASE Right 12/02/2017   Procedure: CARPAL TUNNEL RELEASE ENDOSCOPIC;  Surgeon: Christena Flake, MD;  Location: [facility] ORS;  Service: Orthopedics;  Laterality: Right;   CORONARY ANGIOPLASTY WITH STENT PLACEMENT  2017   x 2   EYE SURGERY Bilateral    cataract surgery   GANGLION CYST EXCISION Right 12/02/2017   Procedure: REMOVAL GANGLION OF WRIST;  Surgeon: Christena Flake, MD;  Location: [facility] ORS;  Service: Orthopedics;  Laterality: Right;   heart murmur     HYSTEROSCOPY WITH D & C N/A 04/19/2020   Procedure: DILATATION AND CURETTAGE /HYSTEROSCOPY, POSSIBLE  POLYPECTOMY;  Surgeon: Christeen Douglas, MD;  Location: [facility]  ORS;  Service: Gynecology;  Laterality: N/A;   TONSILLECTOMY AND ADENOIDECTOMY  1962   TUBAL LIGATION  1993   tubaligation  1990   Patient Active Problem List   Diagnosis Date Noted   Knee pain 11/08/2022   Tremor 11/08/2022   Ankle pain 04/12/2022   Chronic venous insufficiency 04/08/2022   Major depressive disorder, single episode, mild (HCC) 02/14/2022   Groin hematoma 02/14/2022   Enlarged lymph node in neck 10/12/2021   Aortic atherosclerosis (HCC) 10/12/2021   History of COVID-19 08/09/2021   Abdominal pain 11/30/2020   Toenail fungus 11/24/2020   Back pain 11/24/2020   Carotid artery disease (HCC) 07/28/2020   Leg skin lesion, right 03/24/2020   Shingles 12/10/2019   Abnormal mammogram 12/02/2019   Thickened endometrium 10/29/2019   Hematochezia 11/11/2018   Rectal polyp 10/28/2018   Vitamin D deficiency 01/18/2018   Iron deficiency anemia secondary to inadequate dietary iron intake 01/17/2018   Easy bruising 12/23/2017   Anemia 12/23/2017   Flexor carpi ulnaris tenosynovitis 12/03/2017   Carpal tunnel syndrome, right 10/15/2017   Ganglion of right wrist 10/15/2017   Leg length inequality 10/15/2017   Lumbar spondylosis 10/15/2017  Osteoporosis without current pathological fracture 09/09/2017   Heart murmur 09/09/2017   Mild depression 09/09/2017   Crohn disease (HCC)    Chest pain 04/12/2017   Right shoulder pain 01/16/2017   History of frequent urinary tract infections 08/09/2016   Heart palpitations 08/03/2016   Weakness 07/02/2016   Light headedness 04/17/2016   Coronary artery disease of native artery of native heart with stable angina pectoris (HCC)    Hip pain 04/13/2016   Health care maintenance 01/19/2016   Essential hypertension 11/07/2015   Hypercholesterolemia 11/07/2015   Migraine headache 11/07/2015   Nephrolithiasis 11/07/2015   GERD (gastroesophageal reflux disease) 11/07/2015   Mitral regurgitation 11/07/2015   Stress 11/07/2015    Varicose veins of both lower extremities with pain 05/10/2015   Pharyngoesophageal dysphagia 03/11/2015   Pancreatic lesion 04/25/2014   Hemangioma of liver 03/12/2014   Arthralgia 05/29/2011    PCP/REFERRING PROVIDER: Dale Union, MD  REFERRING DIAG: Right shoulder pain, unspecified chronicity  THERAPY DIAG:  Chronic right shoulder pain  Impingement syndrome of right shoulder  Weakness of right shoulder  Stiffness of right shoulder, not elsewhere classified  Rationale for Evaluation and Treatment: Rehabilitation  ONSET DATE: August 2023  SUBJECTIVE:                                                                                                                                                                                      SUBJECTIVE STATEMENT: Pt reports to PT with c/c of chronic R shoulder pain. She reports helping her family set up a venue for a wedding and feeling like she "overdid" it with her shoulder; pt also reports continuing to do a lot of house and yard work with her shoulder which she feels didn't help the pain. She reports feeling "tingling" in her fingers however says she has had that for a while and it isn't a new occurrence since her shoulder pain started. Pt denies any previous episodes however says she did fracture her R shoulder a few years ago from a fall. No significant change in 24 hour behavior but pt reports her shoulder feels more stiff/painful after extended periods of rest. Hand dominance: Right  PERTINENT HISTORY: L meniscus tear, Crohn's disease, hx of 3 MI's and 5 stent placements (2022 most recent), Osteopenia  PAIN:  Are you having pain? Yes: NPRS scale: 1/10 now, 7/10 worst Pain location: R shoulder Pain description: Achy, dull, sore Aggravating factors: Reaching overhead, reaching forward, lifting objects Relieving factors: Ice  PRECAUTIONS: None  RED FLAGS: None   WEIGHT BEARING RESTRICTIONS: No  FALLS:  Has patient fallen in  last 6 months? No  LIVING ENVIRONMENT: Lives with: lives  alone Lives in: House/apartment Stairs: Yes: External: 4 steps; can reach both Has following equipment at home: Dan Humphreys - 4 wheeled  OCCUPATION: Retired Runner, broadcasting/film/video; does a lot of yard-work and volunteering for her church  PLOF: Independent  PATIENT GOALS: Be able to perform all yardwork/housework and community activities with no pain  NEXT MD VISIT: TBD  OBJECTIVE:  Note: Objective measures were completed at Evaluation unless otherwise noted.  DIAGNOSTIC FINDINGS:  N/A  PATIENT SURVEYS:  FOTO 46/64  COGNITION: Overall cognitive status: Within functional limits for tasks assessed     SENSATION: WFL  POSTURE: Rounded shoulders bilaterall  UPPER EXTREMITY ROM:   Active ROM Right eval Left eval  Shoulder flexion 161 ! 172  Shoulder extension [facility]  Shoulder abduction 121 ! 144  Shoulder adduction    Shoulder internal rotation 81 ! 74  Shoulder external rotation 71 ! 84  Elbow flexion WFL WFL  Elbow extension [facility] WFL  Wrist flexion    Wrist extension    Wrist ulnar deviation    Wrist radial deviation    Wrist pronation    Wrist supination    (Blank rows = not tested)  UPPER EXTREMITY MMT:  MMT Right eval Left eval  Shoulder flexion 4- ! 5  Shoulder extension 5 ! 5  Shoulder abduction 4- ! 5  Shoulder adduction    Shoulder internal rotation 5 5  Shoulder external rotation 3+ ! 5  Middle trapezius    Lower trapezius    Elbow flexion 5 5  Elbow extension 5 5  Wrist flexion    Wrist extension    Wrist ulnar deviation    Wrist radial deviation    Wrist pronation    Wrist supination    Grip strength (lbs)    (Blank rows = not tested)  SHOULDER SPECIAL TESTS: Impingement tests: Neer impingement test: positive , Hawkins/Kennedy impingement test: positive , and Painful arc test: positive  Biceps assessment: Speed's test: positive   JOINT MOBILITY TESTING:  To be assessed next  session  PALPATION:  TTP along R clavicle as well as near biceps tendon insertion site   TODAY'S TREATMENT:                                                                                                                                         DATE: 09/13/2023  Subjective: Pt continues to report no pain at rest; 2-3/10 pain with movement. She says she tried modifying her activity over the weekend and made sure not to push through the pain when doing yardwork; she reports she feels like this helped.   Objective: Pt educated again on importance of activity modification and taking rest/ice breaks when she feels pain in her R shoulder  Therex: New HEP reviewed (resisted supination, pendulum's): pt required demonstration again to perform resisted supination correctly; Isometric HEP reviewed as well  Seated Shoulder Pulley: 20x each  for flexion and abduction - verbal cues to keep in pain-free range  Isometric Shoulder ER step-outs: 1x10 (2 steps out each rep)   PATIENT EDUCATION: Education details: HEP form and frequency and the importance of keeping exercises pain-free, role of PT, plan of care Person educated: Patient Education method: Explanation, Demonstration, and Handouts Education comprehension: returned demonstration  HOME EXERCISE PROGRAM: Access Code: [email or web address] URL: [email or web address] Date: 09/10/2023 Prepared by: Dorene Grebe Exercises - Doorway Pec Stretch at 90 Degrees Abduction - 2 x daily - 4-5 x weekly - 2 sets - 10 reps - 30 seconds hold - Standing Isometric Shoulder Flexion with Doorway - 2 x daily - 4-5 x weekly - 2 sets - 10 reps - 2-3 seconds hold - Standing Isometric Shoulder Abduction with Doorway - 2 x daily - 4-5 x weekly - 2 sets - 10 reps - 2-3 seconds hold - Standing Isometric Shoulder Internal Rotation at Doorway - 2 x daily - 4-5 x weekly - 2 sets - 10 reps - 2-3 seconds hold - Standing Isometric Shoulder External Rotation with Doorway - 2 x daily -  4-5 x weekly - 2 sets - 10 reps - 2-3 seconds hold - Forearm Supination with Resistance - 2 x daily - 4-5 x weekly - 2 sets - 10 reps - Standing Median Nerve Glide - 2 x daily - 7 x weekly - 2 sets - 10 reps - Ulnar Nerve Flossing - 2 x daily - 7 x weekly - 2 sets - 10 reps   ASSESSMENT:  CLINICAL IMPRESSION: Pt reports to PT with no significant changes in R shoulder pain since last session. Pt's entire HEP reviewed at start of session; she required demonstrations in order to perform isometric flexion/abduction/extension holds correctly, as well as to perform resisted supination correctly. Pt shown how to use pulley for home use with education provided to keep exercise in pain-free range. Pt tolerated resisted isometric shoulder ER step-outs well with no pain today. Pt educated again throughout session on importance of activity modification and use of ice/rest at home to alleviate symptoms. Pt will benefit from continued PT services upon discharge to safely address deficits listed in patient problem list for decreased caregiver assistance and eventual return to PLOF.   OBJECTIVE IMPAIRMENTS: decreased ROM, decreased strength, hypomobility, impaired UE functional use, postural dysfunction, and pain.   ACTIVITY LIMITATIONS: lifting, reach over head, hygiene/grooming, and caring for others  PARTICIPATION LIMITATIONS: cleaning, laundry, community activity, yard work, and church  PERSONAL FACTORS: Age, Time since onset of injury/illness/exacerbation, and 3+ comorbidities: Crohn's disease, hx of MI's, osteopenia  are also affecting patient's functional outcome.   REHAB POTENTIAL: Good  CLINICAL DECISION MAKING: Stable/uncomplicated  EVALUATION COMPLEXITY: Low   GOALS:  SHORT TERM GOALS: Target date: 09/28/2023  Pt will be independent and compliant with HEP in order to improve R shoulder AROM by at least 10 degrees in all planes. Baseline: See above for initial R shoulder AROM  measurements Goal status: INITIAL   LONG TERM GOALS: Target date: 10/26/2023  Pt will improve FOTO to at least 64 in order to demonstrate improvement in pain and function with ADL's. Baseline: 46 Goal status: INITIAL  2.  Pt will demonstrate full and pain-free R shoulder AROM in all planes in order to improve function with overhead reaching and lifting tasks at home. Baseline: See above for initial R shoulder AROM measurements Goal status: INITIAL  3.  Pt will increase all R shoulder MMT scores to at least 4+/5 with  no pain in order to show improved strength needed to perform yardwork and church activities. Baseline: See above for initial MMT scores Goal status: INITIAL  4.  Pt will demonstrate negative impingement and Speed's tests in order to show improvement in R shoulder pain. Baseline: (+) HK, Neer's, Painful Arc, Speed's Goal status: INITIAL  5.  Pt will demonstrate negative Full/Empty Can testing in order to show improvement in rotator cuff pain/pathology. Baseline: (+) full/empty can on R side (10/29) Goal status: INITIAL   PLAN:  PT FREQUENCY: 2x/week  PT DURATION: 8 weeks  PLANNED INTERVENTIONS: 97110-Therapeutic exercises, 97530- Therapeutic activity, 97112- Neuromuscular re-education, 97140- Manual therapy, Joint mobilization, Cryotherapy, and Moist heat  PLAN FOR NEXT SESSION: shoulder PAM's, review HEP, follow-up on activity modification, introduce shoulder resisted rotator cuff strengthening exercises (isometric step-outs, resisted ER), review pulley  Vassie Kugel, SPT 09/13/23, 6:04 PM

## 2023-09-14 ENCOUNTER — Encounter: Payer: Medicare PPO | Admitting: Physical Therapy

## 2023-09-15 ENCOUNTER — Other Ambulatory Visit: Payer: Self-pay | Admitting: Internal Medicine

## 2023-09-16 ENCOUNTER — Encounter: Payer: Medicare PPO | Admitting: Physical Therapy

## 2023-09-17 ENCOUNTER — Ambulatory Visit (INDEPENDENT_AMBULATORY_CARE_PROVIDER_SITE_OTHER): Payer: Medicare PPO | Admitting: Nurse Practitioner

## 2023-09-17 DIAGNOSIS — I25119 Atherosclerotic heart disease of native coronary artery with unspecified angina pectoris: Secondary | ICD-10-CM | POA: Diagnosis not present

## 2023-09-17 DIAGNOSIS — I700 Atherosclerosis of aorta: Secondary | ICD-10-CM | POA: Diagnosis not present

## 2023-09-17 DIAGNOSIS — K5010 Crohn's disease of large intestine without complications: Secondary | ICD-10-CM | POA: Diagnosis not present

## 2023-09-17 DIAGNOSIS — K219 Gastro-esophageal reflux disease without esophagitis: Secondary | ICD-10-CM | POA: Diagnosis not present

## 2023-09-17 DIAGNOSIS — Z95820 Peripheral vascular angioplasty status with implants and grafts: Secondary | ICD-10-CM | POA: Diagnosis not present

## 2023-09-21 ENCOUNTER — Encounter: Payer: Medicare PPO | Admitting: Physical Therapy

## 2023-09-22 ENCOUNTER — Ambulatory Visit: Payer: Medicare PPO | Admitting: Physical Therapy

## 2023-09-22 ENCOUNTER — Encounter: Payer: Self-pay | Admitting: Physical Therapy

## 2023-09-22 DIAGNOSIS — M7541 Impingement syndrome of right shoulder: Secondary | ICD-10-CM | POA: Diagnosis not present

## 2023-09-22 DIAGNOSIS — M25611 Stiffness of right shoulder, not elsewhere classified: Secondary | ICD-10-CM | POA: Diagnosis not present

## 2023-09-22 DIAGNOSIS — R29898 Other symptoms and signs involving the musculoskeletal system: Secondary | ICD-10-CM

## 2023-09-22 DIAGNOSIS — G8929 Other chronic pain: Secondary | ICD-10-CM

## 2023-09-22 DIAGNOSIS — M25511 Pain in right shoulder: Secondary | ICD-10-CM | POA: Diagnosis not present

## 2023-09-22 NOTE — Therapy (Unsigned)
OUTPATIENT PHYSICAL THERAPY SHOULDER TREATMENT   Patient Name: Morgan Moore MRN: [id number] DOB:Oct 13, 1956, 67 y.o., female Today's Date: 09/22/2023  END OF SESSION:  PT End of Session - 09/22/23 1513     Visit Number 5    Number of Visits 16    Date for PT Re-Evaluation 10/26/23    PT Start Time 1513    PT Stop Time 1559    PT Time Calculation (min) 46 min    Activity Tolerance Patient tolerated treatment well    Behavior During Therapy WFL for tasks assessed/performed             Past Medical History:  Diagnosis Date   Allergy    Anemia    Anxiety    Chicken pox    Complication of anesthesia    vomitting   Coronary artery disease    Crohn disease (HCC)    DVT (deep venous thrombosis) (HCC) 2017   Dyspnea    on excertion   Dysrhythmia 2019   palpitations   GERD (gastroesophageal reflux disease)    Heart disease    Heart murmur    Heart murmur    History of kidney stones    Hyperlipidemia    Hypertension    Inflammatory bowel disease    Migraines    hormonal, puberty   Myocardial infarction (HCC)    Phlebitis    PONV (postoperative nausea and vomiting)    Past Surgical History:  Procedure Laterality Date   BREAST BIOPSY Left 07/05/2019   FIBROEPITHELIAL lesion/neg   BREAST CYST ASPIRATION     unsure of side   CARPAL TUNNEL RELEASE Right 12/02/2017   Procedure: CARPAL TUNNEL RELEASE ENDOSCOPIC;  Surgeon: Christena Flake, MD;  Location: [facility];  Service: Orthopedics;  Laterality: Right;   CORONARY ANGIOPLASTY WITH STENT PLACEMENT  2017   x 2   EYE SURGERY Bilateral    cataract surgery   GANGLION CYST EXCISION Right 12/02/2017   Procedure: REMOVAL GANGLION OF WRIST;  Surgeon: Christena Flake, MD;  Location: [facility];  Service: Orthopedics;  Laterality: Right;   heart murmur     HYSTEROSCOPY WITH D & C N/A 04/19/2020   Procedure: DILATATION AND CURETTAGE /HYSTEROSCOPY, POSSIBLE  POLYPECTOMY;  Surgeon: Christeen Douglas, MD;  Location: [facility];   Service: Gynecology;  Laterality: N/A;   TONSILLECTOMY AND ADENOIDECTOMY  1962   TUBAL LIGATION  1993   tubaligation  1990   Patient Active Problem List   Diagnosis Date Noted   Knee pain 11/08/2022   Tremor 11/08/2022   Ankle pain 04/12/2022   Chronic venous insufficiency 04/08/2022   Major depressive disorder, single episode, mild (HCC) 02/14/2022   Groin hematoma 02/14/2022   Enlarged lymph node in neck 10/12/2021   Aortic atherosclerosis (HCC) 10/12/2021   History of COVID-19 08/09/2021   Abdominal pain 11/30/2020   Toenail fungus 11/24/2020   Back pain 11/24/2020   Carotid artery disease (HCC) 07/28/2020   Leg skin lesion, right 03/24/2020   Shingles 12/10/2019   Abnormal mammogram 12/02/2019   Thickened endometrium 10/29/2019   Hematochezia 11/11/2018   Rectal polyp 10/28/2018   Vitamin D deficiency 01/18/2018   Iron deficiency anemia secondary to inadequate dietary iron intake 01/17/2018   Easy bruising 12/23/2017   Anemia 12/23/2017   Flexor carpi ulnaris tenosynovitis 12/03/2017   Carpal tunnel syndrome, right 10/15/2017   Ganglion of right wrist 10/15/2017   Leg length inequality 10/15/2017   Lumbar spondylosis 10/15/2017   Osteoporosis without current  pathological fracture 09/09/2017   Heart murmur 09/09/2017   Mild depression 09/09/2017   Crohn disease (HCC)    Chest pain 04/12/2017   Right shoulder pain 01/16/2017   History of frequent urinary tract infections 08/09/2016   Heart palpitations 08/03/2016   Weakness 07/02/2016   Light headedness 04/17/2016   Coronary artery disease of native artery of native heart with stable angina pectoris (HCC)    Hip pain 04/13/2016   Health care maintenance 01/19/2016   Essential hypertension 11/07/2015   Hypercholesterolemia 11/07/2015   Migraine headache 11/07/2015   Nephrolithiasis 11/07/2015   GERD (gastroesophageal reflux disease) 11/07/2015   Mitral regurgitation 11/07/2015   Stress 11/07/2015   Varicose  veins of both lower extremities with pain 05/10/2015   Pharyngoesophageal dysphagia 03/11/2015   Pancreatic lesion 04/25/2014   Hemangioma of liver 03/12/2014   Arthralgia 05/29/2011    PCP/REFERRING PROVIDER: Dale Freeport, MD  REFERRING DIAG: Right shoulder pain, unspecified chronicity  THERAPY DIAG:  Chronic right shoulder pain  Impingement syndrome of right shoulder  Weakness of right shoulder  Stiffness of right shoulder, not elsewhere classified  Rationale for Evaluation and Treatment: Rehabilitation  ONSET DATE: August 2023  SUBJECTIVE:                                                                                                                                                                                      SUBJECTIVE STATEMENT: Pt reports to PT with c/c of chronic R shoulder pain. She reports helping her family set up a venue for a wedding and feeling like she "overdid" it with her shoulder; pt also reports continuing to do a lot of house and yard work with her shoulder which she feels didn't help the pain. She reports feeling "tingling" in her fingers however says she has had that for a while and it isn't a new occurrence since her shoulder pain started. Pt denies any previous episodes however says she did fracture her R shoulder a few years ago from a fall. No significant change in 24 hour behavior but pt reports her shoulder feels more stiff/painful after extended periods of rest. Hand dominance: Right  PERTINENT HISTORY: L meniscus tear, Crohn's disease, hx of 3 MI's and 5 stent placements (2022 most recent), Osteopenia  PAIN:  Are you having pain? Yes: NPRS scale: 1/10 now, 7/10 worst Pain location: R shoulder Pain description: Achy, dull, sore Aggravating factors: Reaching overhead, reaching forward, lifting objects Relieving factors: Ice  PRECAUTIONS: None  RED FLAGS: None   WEIGHT BEARING RESTRICTIONS: No  FALLS:  Has patient fallen in last 6  months? No  LIVING ENVIRONMENT: Lives with: lives alone Lives in:  House/apartment Stairs: Yes: External: 4 steps; can reach both Has following equipment at home: Dan Humphreys - 4 wheeled  OCCUPATION: Retired Runner, broadcasting/film/video; does a lot of yard-work and volunteering for her church  PLOF: Independent  PATIENT GOALS: Be able to perform all yardwork/housework and community activities with no pain  NEXT MD VISIT: TBD  OBJECTIVE:  Note: Objective measures were completed at Evaluation unless otherwise noted.  DIAGNOSTIC FINDINGS:  N/A  PATIENT SURVEYS:  FOTO 46/64  COGNITION: Overall cognitive status: Within functional limits for tasks assessed     SENSATION: WFL  POSTURE: Rounded shoulders bilaterall  UPPER EXTREMITY ROM:   Active ROM Right eval Left eval  Shoulder flexion 161 ! 172  Shoulder extension [facility]  Shoulder abduction 121 ! 144  Shoulder adduction    Shoulder internal rotation 81 ! 74  Shoulder external rotation 71 ! 84  Elbow flexion WFL WFL  Elbow extension [facility] WFL  Wrist flexion    Wrist extension    Wrist ulnar deviation    Wrist radial deviation    Wrist pronation    Wrist supination    (Blank rows = not tested)  UPPER EXTREMITY MMT:  MMT Right eval Left eval  Shoulder flexion 4- ! 5  Shoulder extension 5 ! 5  Shoulder abduction 4- ! 5  Shoulder adduction    Shoulder internal rotation 5 5  Shoulder external rotation 3+ ! 5  Middle trapezius    Lower trapezius    Elbow flexion 5 5  Elbow extension 5 5  Wrist flexion    Wrist extension    Wrist ulnar deviation    Wrist radial deviation    Wrist pronation    Wrist supination    Grip strength (lbs)    (Blank rows = not tested)  SHOULDER SPECIAL TESTS: Impingement tests: Neer impingement test: positive , Hawkins/Kennedy impingement test: positive , and Painful arc test: positive  Biceps assessment: Speed's test: positive   JOINT MOBILITY TESTING:  To be assessed next session  PALPATION:   TTP along R clavicle as well as near biceps tendon insertion site   TODAY'S TREATMENT:                                                                                                                                         DATE: 09/22/2023  Subjective: Pt reports no pain at rest, still experiences 3/10 pain with overhead movements. She says she has been using the pulley at home which she feels is going well and helping her range.    Objective: Pt educated again on importance of activity modification and taking rest/ice breaks when she feels pain in her R shoulder  Therex: HEP review again to ensure proper form and no pain with exercises (pulley, supination, isometrics)  Seated Shoulder Pulley: 20x each for flexion and abduction - verbal cues to keep in pain-free range  Isometric Shoulder  ER step-outs: 2x10 (2 steps out each rep) with yellow TB - trialed with red but resulted in increased pain   Manual Therapy: Supine R shoulder Inferior Glides: grades 1-2 to decrease pain x 8 minutes   STM with hypervolt to R shoulder musculature x 7 minutes  Cold pack applied to R shoulder x 5 minutes   PATIENT EDUCATION: Education details: HEP form and frequency and the importance of keeping exercises pain-free, role of PT, plan of care Person educated: Patient Education method: Explanation, Demonstration, and Handouts Education comprehension: returned demonstration  HOME EXERCISE PROGRAM: Access Code: [email or web address] URL: [email or web address] Date: 09/10/2023 Prepared by: Dorene Grebe Exercises - Doorway Pec Stretch at 90 Degrees Abduction - 2 x daily - 4-5 x weekly - 2 sets - 10 reps - 30 seconds hold - Standing Isometric Shoulder Flexion with Doorway - 2 x daily - 4-5 x weekly - 2 sets - 10 reps - 2-3 seconds hold - Standing Isometric Shoulder Abduction with Doorway - 2 x daily - 4-5 x weekly - 2 sets - 10 reps - 2-3 seconds hold - Standing Isometric Shoulder Internal Rotation at  Doorway - 2 x daily - 4-5 x weekly - 2 sets - 10 reps - 2-3 seconds hold - Standing Isometric Shoulder External Rotation with Doorway - 2 x daily - 4-5 x weekly - 2 sets - 10 reps - 2-3 seconds hold - Forearm Supination with Resistance - 2 x daily - 4-5 x weekly - 2 sets - 10 reps - Standing Median Nerve Glide - 2 x daily - 7 x weekly - 2 sets - 10 reps - Ulnar Nerve Flossing - 2 x daily - 7 x weekly - 2 sets - 10 reps   ASSESSMENT:  CLINICAL IMPRESSION: Pt reports to PT with no significant changes in R shoulder pain since last session. She demonstrates mild improvements in R shoulder AROM at start of session but still experiences increased pain at ~90 degrees of flexion/abduction. HEP reviewed again (pulley, isometrics) to ensure pt was performing with proper form and keeping the exercises pain-free. Pt continues to be educated on importance of activity modification and keeping everything pain-free at home to ease irritability. Pt will benefit from continued PT services upon discharge to safely address deficits listed in patient problem list for decreased caregiver assistance and eventual return to PLOF.   OBJECTIVE IMPAIRMENTS: decreased ROM, decreased strength, hypomobility, impaired UE functional use, postural dysfunction, and pain.   ACTIVITY LIMITATIONS: lifting, reach over head, hygiene/grooming, and caring for others  PARTICIPATION LIMITATIONS: cleaning, laundry, community activity, yard work, and church  PERSONAL FACTORS: Age, Time since onset of injury/illness/exacerbation, and 3+ comorbidities: Crohn's disease, hx of MI's, osteopenia  are also affecting patient's functional outcome.   REHAB POTENTIAL: Good  CLINICAL DECISION MAKING: Stable/uncomplicated  EVALUATION COMPLEXITY: Low   GOALS:  SHORT TERM GOALS: Target date: 09/28/2023  Pt will be independent and compliant with HEP in order to improve R shoulder AROM by at least 10 degrees in all planes. Baseline: See above for  initial R shoulder AROM measurements Goal status: INITIAL   LONG TERM GOALS: Target date: 10/26/2023  Pt will improve FOTO to at least 64 in order to demonstrate improvement in pain and function with ADL's. Baseline: 46 Goal status: INITIAL  2.  Pt will demonstrate full and pain-free R shoulder AROM in all planes in order to improve function with overhead reaching and lifting tasks at home. Baseline: See above for initial R shoulder AROM measurements  Goal status: INITIAL  3.  Pt will increase all R shoulder MMT scores to at least 4+/5 with no pain in order to show improved strength needed to perform yardwork and church activities. Baseline: See above for initial MMT scores Goal status: INITIAL  4.  Pt will demonstrate negative impingement and Speed's tests in order to show improvement in R shoulder pain. Baseline: (+) HK, Neer's, Painful Arc, Speed's Goal status: INITIAL  5.  Pt will demonstrate negative Full/Empty Can testing in order to show improvement in rotator cuff pain/pathology. Baseline: (+) full/empty can on R side (10/29) Goal status: INITIAL   PLAN:  PT FREQUENCY: 2x/week  PT DURATION: 8 weeks  PLANNED INTERVENTIONS: 97110-Therapeutic exercises, 97530- Therapeutic activity, 97112- Neuromuscular re-education, 97140- Manual therapy, Joint mobilization, Cryotherapy, and Moist heat  PLAN FOR NEXT SESSION: shoulder PAM's, review HEP, follow-up on activity modification, introduce shoulder resisted rotator cuff strengthening exercises (isometric step-outs, resisted ER), review pulley/activity modification  Cammie Mcgee, PT, DPT # [id number] Cena Benton, SPT 09/22/23, 3:59 PM

## 2023-09-23 ENCOUNTER — Encounter: Payer: Medicare PPO | Admitting: Physical Therapy

## 2023-09-23 ENCOUNTER — Other Ambulatory Visit: Payer: Self-pay | Admitting: Internal Medicine

## 2023-09-24 ENCOUNTER — Encounter: Payer: Self-pay | Admitting: Physical Therapy

## 2023-09-24 ENCOUNTER — Ambulatory Visit: Payer: Medicare PPO | Admitting: Physical Therapy

## 2023-09-24 DIAGNOSIS — M7541 Impingement syndrome of right shoulder: Secondary | ICD-10-CM

## 2023-09-24 DIAGNOSIS — G8929 Other chronic pain: Secondary | ICD-10-CM

## 2023-09-24 DIAGNOSIS — M25611 Stiffness of right shoulder, not elsewhere classified: Secondary | ICD-10-CM

## 2023-09-24 DIAGNOSIS — R29898 Other symptoms and signs involving the musculoskeletal system: Secondary | ICD-10-CM

## 2023-09-24 DIAGNOSIS — M25511 Pain in right shoulder: Secondary | ICD-10-CM | POA: Diagnosis not present

## 2023-09-24 NOTE — Therapy (Signed)
OUTPATIENT PHYSICAL THERAPY SHOULDER TREATMENT   Patient Name: Morgan Moore MRN: [id number] DOB:02-20-1956, 67 y.o., female Today's Date: 09/25/2023  END OF SESSION:  PT End of Session - 09/24/23 0925     Visit Number 6    Number of Visits 16    Date for PT Re-Evaluation 10/26/23    PT Start Time 0945    PT Stop Time 1032    PT Time Calculation (min) 47 min    Activity Tolerance Patient tolerated treatment well    Behavior During Therapy [facility] for tasks assessed/performed             Past Medical History:  Diagnosis Date   Allergy    Anemia    Anxiety    Chicken pox    Complication of anesthesia    vomitting   Coronary artery disease    Crohn disease (HCC)    DVT (deep venous thrombosis) (HCC) 2017   Dyspnea    on excertion   Dysrhythmia 2019   palpitations   GERD (gastroesophageal reflux disease)    Heart disease    Heart murmur    Heart murmur    History of kidney stones    Hyperlipidemia    Hypertension    Inflammatory bowel disease    Migraines    hormonal, puberty   Myocardial infarction (HCC)    Phlebitis    PONV (postoperative nausea and vomiting)    Past Surgical History:  Procedure Laterality Date   BREAST BIOPSY Left 07/05/2019   FIBROEPITHELIAL lesion/neg   BREAST CYST ASPIRATION     unsure of side   CARPAL TUNNEL RELEASE Right 12/02/2017   Procedure: CARPAL TUNNEL RELEASE ENDOSCOPIC;  Surgeon: Christena Flake, MD;  Location: [facility] ORS;  Service: Orthopedics;  Laterality: Right;   CORONARY ANGIOPLASTY WITH STENT PLACEMENT  2017   x 2   EYE SURGERY Bilateral    cataract surgery   GANGLION CYST EXCISION Right 12/02/2017   Procedure: REMOVAL GANGLION OF WRIST;  Surgeon: Christena Flake, MD;  Location: [facility];  Service: Orthopedics;  Laterality: Right;   heart murmur     HYSTEROSCOPY WITH D & C N/A 04/19/2020   Procedure: DILATATION AND CURETTAGE /HYSTEROSCOPY, POSSIBLE  POLYPECTOMY;  Surgeon: Christeen Douglas, MD;  Location: [facility];   Service: Gynecology;  Laterality: N/A;   TONSILLECTOMY AND ADENOIDECTOMY  1962   TUBAL LIGATION  1993   tubaligation  1990   Patient Active Problem List   Diagnosis Date Noted   Knee pain 11/08/2022   Tremor 11/08/2022   Ankle pain 04/12/2022   Chronic venous insufficiency 04/08/2022   Major depressive disorder, single episode, mild (HCC) 02/14/2022   Groin hematoma 02/14/2022   Enlarged lymph node in neck 10/12/2021   Aortic atherosclerosis (HCC) 10/12/2021   History of COVID-19 08/09/2021   Abdominal pain 11/30/2020   Toenail fungus 11/24/2020   Back pain 11/24/2020   Carotid artery disease (HCC) 07/28/2020   Leg skin lesion, right 03/24/2020   Shingles 12/10/2019   Abnormal mammogram 12/02/2019   Thickened endometrium 10/29/2019   Hematochezia 11/11/2018   Rectal polyp 10/28/2018   Vitamin D deficiency 01/18/2018   Iron deficiency anemia secondary to inadequate dietary iron intake 01/17/2018   Easy bruising 12/23/2017   Anemia 12/23/2017   Flexor carpi ulnaris tenosynovitis 12/03/2017   Carpal tunnel syndrome, right 10/15/2017   Ganglion of right wrist 10/15/2017   Leg length inequality 10/15/2017   Lumbar spondylosis 10/15/2017   Osteoporosis without current  pathological fracture 09/09/2017   Heart murmur 09/09/2017   Mild depression 09/09/2017   Crohn disease (HCC)    Chest pain 04/12/2017   Right shoulder pain 01/16/2017   History of frequent urinary tract infections 08/09/2016   Heart palpitations 08/03/2016   Weakness 07/02/2016   Light headedness 04/17/2016   Coronary artery disease of native artery of native heart with stable angina pectoris (HCC)    Hip pain 04/13/2016   Health care maintenance 01/19/2016   Essential hypertension 11/07/2015   Hypercholesterolemia 11/07/2015   Migraine headache 11/07/2015   Nephrolithiasis 11/07/2015   GERD (gastroesophageal reflux disease) 11/07/2015   Mitral regurgitation 11/07/2015   Stress 11/07/2015   Varicose  veins of both lower extremities with pain 05/10/2015   Pharyngoesophageal dysphagia 03/11/2015   Pancreatic lesion 04/25/2014   Hemangioma of liver 03/12/2014   Arthralgia 05/29/2011    PCP/REFERRING PROVIDER: Dale Billings, MD  REFERRING DIAG: Right shoulder pain, unspecified chronicity  THERAPY DIAG:  Chronic right shoulder pain  Impingement syndrome of right shoulder  Weakness of right shoulder  Stiffness of right shoulder, not elsewhere classified  Rationale for Evaluation and Treatment: Rehabilitation  ONSET DATE: August 2023  SUBJECTIVE:                                                                                                                                                                                      SUBJECTIVE STATEMENT: Pt reports to PT with c/c of chronic R shoulder pain. She reports helping her family set up a venue for a wedding and feeling like she "overdid" it with her shoulder; pt also reports continuing to do a lot of house and yard work with her shoulder which she feels didn't help the pain. She reports feeling "tingling" in her fingers however says she has had that for a while and it isn't a new occurrence since her shoulder pain started. Pt denies any previous episodes however says she did fracture her R shoulder a few years ago from a fall. No significant change in 24 hour behavior but pt reports her shoulder feels more stiff/painful after extended periods of rest. Hand dominance: Right  PERTINENT HISTORY: L meniscus tear, Crohn's disease, hx of 3 MI's and 5 stent placements (2022 most recent), Osteopenia  PAIN:  Are you having pain? Yes: NPRS scale: 1/10 now, 7/10 worst Pain location: R shoulder Pain description: Achy, dull, sore Aggravating factors: Reaching overhead, reaching forward, lifting objects Relieving factors: Ice  PRECAUTIONS: None  RED FLAGS: None   WEIGHT BEARING RESTRICTIONS: No  FALLS:  Has patient fallen in last 6  months? No  LIVING ENVIRONMENT: Lives with: lives alone Lives in:  House/apartment Stairs: Yes: External: 4 steps; can reach both Has following equipment at home: Dan Humphreys - 4 wheeled  OCCUPATION: Retired Runner, broadcasting/film/video; does a lot of yard-work and volunteering for her church  PLOF: Independent  PATIENT GOALS: Be able to perform all yardwork/housework and community activities with no pain  NEXT MD VISIT: TBD  OBJECTIVE:  Note: Objective measures were completed at Evaluation unless otherwise noted.  DIAGNOSTIC FINDINGS:  N/A  PATIENT SURVEYS:  FOTO 46/64  COGNITION: Overall cognitive status: Within functional limits for tasks assessed     SENSATION: WFL  POSTURE: Rounded shoulders bilaterall  UPPER EXTREMITY ROM:   Active ROM Right eval Left eval  Shoulder flexion 161 ! 172  Shoulder extension [facility]  Shoulder abduction 121 ! 144  Shoulder adduction    Shoulder internal rotation 81 ! 74  Shoulder external rotation 71 ! 84  Elbow flexion WFL WFL  Elbow extension [facility] WFL  Wrist flexion    Wrist extension    Wrist ulnar deviation    Wrist radial deviation    Wrist pronation    Wrist supination    (Blank rows = not tested)  UPPER EXTREMITY MMT:  MMT Right eval Left eval  Shoulder flexion 4- ! 5  Shoulder extension 5 ! 5  Shoulder abduction 4- ! 5  Shoulder adduction    Shoulder internal rotation 5 5  Shoulder external rotation 3+ ! 5  Middle trapezius    Lower trapezius    Elbow flexion 5 5  Elbow extension 5 5  Wrist flexion    Wrist extension    Wrist ulnar deviation    Wrist radial deviation    Wrist pronation    Wrist supination    Grip strength (lbs)    (Blank rows = not tested)  SHOULDER SPECIAL TESTS: Impingement tests: Neer impingement test: positive , Hawkins/Kennedy impingement test: positive , and Painful arc test: positive  Biceps assessment: Speed's test: positive   JOINT MOBILITY TESTING:  To be assessed next session  PALPATION:   TTP along R clavicle as well as near biceps tendon insertion site   TODAY'S TREATMENT:                                                                                                                                         DATE: 09/25/2023  Subjective: Pt reports no pain at rest, still experiences 3/10 pain with overhead movements. Pt. Reports R shoulder horizontal adduction and sh. Abduction with pulleys is better.  Pt. Discussed getting a MRI to determine what is going on.      Objective: Pt educated again on importance of activity modification and taking rest/ice breaks when she feels pain in her R shoulder  Therex: HEP review again to ensure proper form and no pain with exercises (pulley, supination, isometrics)  Seated wand ex.: flexion/ chest press 10x each (pain limited).  Standing sh.  Extension/ IR 10x2 each (no pain).    Supine R shoulder manual isometrics (all planes of sh. ROM/ bicep flexion/ tricep extension).     Manual Therapy: Supine R shoulder Inferior/ posterior Glides: grades 1-2 to decrease pain x 8 minutes   Seated STM with hypervolt to L/R upper trap./ shoulder musculature x 7 minutes  Cold pack applied to R shoulder x 5 minutes   PATIENT EDUCATION: Education details: HEP form and frequency and the importance of keeping exercises pain-free, role of PT, plan of care Person educated: Patient Education method: Explanation, Demonstration, and Handouts Education comprehension: returned demonstration  HOME EXERCISE PROGRAM: Access Code: [email or web address] URL: [email or web address] Date: 09/10/2023 Prepared by: Dorene Grebe Exercises - Doorway Pec Stretch at 90 Degrees Abduction - 2 x daily - 4-5 x weekly - 2 sets - 10 reps - 30 seconds hold - Standing Isometric Shoulder Flexion with Doorway - 2 x daily - 4-5 x weekly - 2 sets - 10 reps - 2-3 seconds hold - Standing Isometric Shoulder Abduction with Doorway - 2 x daily - 4-5 x weekly - 2 sets - 10 reps - 2-3  seconds hold - Standing Isometric Shoulder Internal Rotation at Doorway - 2 x daily - 4-5 x weekly - 2 sets - 10 reps - 2-3 seconds hold - Standing Isometric Shoulder External Rotation with Doorway - 2 x daily - 4-5 x weekly - 2 sets - 10 reps - 2-3 seconds hold - Forearm Supination with Resistance - 2 x daily - 4-5 x weekly - 2 sets - 10 reps - Standing Median Nerve Glide - 2 x daily - 7 x weekly - 2 sets - 10 reps - Ulnar Nerve Flossing - 2 x daily - 7 x weekly - 2 sets - 10 reps   ASSESSMENT:  CLINICAL IMPRESSION: Pt reports to PT with no significant improvements in R shoulder pain since last session. She remains pain limited with repeated overhead reaching and increased pain at ~90 degrees of flexion/abduction. HEP reviewed again (pulley, isometrics) to ensure pt was performing with proper form and keeping the exercises pain-free. Pt continues to be educated on importance of activity modification and keeping everything pain-free at home to ease irritability.  PT sent progress noted to Dr. Lorin Picket to discuss pts. Symptoms/ pain.  Pt will benefit from continued PT services upon discharge to safely address deficits listed in patient problem list for decreased caregiver assistance and eventual return to PLOF.   OBJECTIVE IMPAIRMENTS: decreased ROM, decreased strength, hypomobility, impaired UE functional use, postural dysfunction, and pain.   ACTIVITY LIMITATIONS: lifting, reach over head, hygiene/grooming, and caring for others  PARTICIPATION LIMITATIONS: cleaning, laundry, community activity, yard work, and church  PERSONAL FACTORS: Age, Time since onset of injury/illness/exacerbation, and 3+ comorbidities: Crohn's disease, hx of MI's, osteopenia  are also affecting patient's functional outcome.   REHAB POTENTIAL: Good  CLINICAL DECISION MAKING: Stable/uncomplicated  EVALUATION COMPLEXITY: Low   GOALS:  SHORT TERM GOALS: Target date: 09/28/2023  Pt will be independent and compliant with  HEP in order to improve R shoulder AROM by at least 10 degrees in all planes. Baseline: See above for initial R shoulder AROM measurements Goal status: INITIAL   LONG TERM GOALS: Target date: 10/26/2023  Pt will improve FOTO to at least 64 in order to demonstrate improvement in pain and function with ADL's. Baseline: 46 Goal status: INITIAL  2.  Pt will demonstrate full and pain-free R shoulder AROM in all planes in order to improve function  with overhead reaching and lifting tasks at home. Baseline: See above for initial R shoulder AROM measurements Goal status: INITIAL  3.  Pt will increase all R shoulder MMT scores to at least 4+/5 with no pain in order to show improved strength needed to perform yardwork and church activities. Baseline: See above for initial MMT scores Goal status: INITIAL  4.  Pt will demonstrate negative impingement and Speed's tests in order to show improvement in R shoulder pain. Baseline: (+) HK, Neer's, Painful Arc, Speed's Goal status: INITIAL  5.  Pt will demonstrate negative Full/Empty Can testing in order to show improvement in rotator cuff pain/pathology. Baseline: (+) full/empty can on R side (10/29) Goal status: INITIAL   PLAN:  PT FREQUENCY: 2x/week  PT DURATION: 8 weeks  PLANNED INTERVENTIONS: 97110-Therapeutic exercises, 97530- Therapeutic activity, 97112- Neuromuscular re-education, 97140- Manual therapy, Joint mobilization, Cryotherapy, and Moist heat  PLAN FOR NEXT SESSION: check goals/ f/u on note to Dr. Merwyn Katos, PT, DPT # [phone] 09/25/23, 4:16 PM

## 2023-09-28 ENCOUNTER — Encounter: Payer: Medicare PPO | Admitting: Physical Therapy

## 2023-09-29 ENCOUNTER — Ambulatory Visit: Payer: Medicare PPO

## 2023-09-29 DIAGNOSIS — G8929 Other chronic pain: Secondary | ICD-10-CM

## 2023-09-29 DIAGNOSIS — M7541 Impingement syndrome of right shoulder: Secondary | ICD-10-CM | POA: Diagnosis not present

## 2023-09-29 DIAGNOSIS — R29898 Other symptoms and signs involving the musculoskeletal system: Secondary | ICD-10-CM

## 2023-09-29 DIAGNOSIS — M25611 Stiffness of right shoulder, not elsewhere classified: Secondary | ICD-10-CM

## 2023-09-29 DIAGNOSIS — M25511 Pain in right shoulder: Secondary | ICD-10-CM | POA: Diagnosis not present

## 2023-09-29 NOTE — Therapy (Signed)
OUTPATIENT PHYSICAL THERAPY SHOULDER TREATMENT   Patient Name: Morgan Moore MRN: [id number] DOB:1956-08-03, 67 y.o., female Today's Date: 09/29/2023  END OF SESSION:  PT End of Session - 09/29/23 1517     Visit Number 7    Number of Visits 16    Date for PT Re-Evaluation 10/26/23    PT Start Time 1400    PT Stop Time 1440    PT Time Calculation (min) 40 min    Activity Tolerance Patient limited by pain    Behavior During Therapy [facility] for tasks assessed/performed              Past Medical History:  Diagnosis Date   Allergy    Anemia    Anxiety    Chicken pox    Complication of anesthesia    vomitting   Coronary artery disease    Crohn disease (HCC)    DVT (deep venous thrombosis) (HCC) 2017   Dyspnea    on excertion   Dysrhythmia 2019   palpitations   GERD (gastroesophageal reflux disease)    Heart disease    Heart murmur    Heart murmur    History of kidney stones    Hyperlipidemia    Hypertension    Inflammatory bowel disease    Migraines    hormonal, puberty   Myocardial infarction (HCC)    Phlebitis    PONV (postoperative nausea and vomiting)    Past Surgical History:  Procedure Laterality Date   BREAST BIOPSY Left 07/05/2019   FIBROEPITHELIAL lesion/neg   BREAST CYST ASPIRATION     unsure of side   CARPAL TUNNEL RELEASE Right 12/02/2017   Procedure: CARPAL TUNNEL RELEASE ENDOSCOPIC;  Surgeon: Christena Flake, MD;  Location: [facility] ORS;  Service: Orthopedics;  Laterality: Right;   CORONARY ANGIOPLASTY WITH STENT PLACEMENT  2017   x 2   EYE SURGERY Bilateral    cataract surgery   GANGLION CYST EXCISION Right 12/02/2017   Procedure: REMOVAL GANGLION OF WRIST;  Surgeon: Christena Flake, MD;  Location: [facility] ORS;  Service: Orthopedics;  Laterality: Right;   heart murmur     HYSTEROSCOPY WITH D & C N/A 04/19/2020   Procedure: DILATATION AND CURETTAGE /HYSTEROSCOPY, POSSIBLE  POLYPECTOMY;  Surgeon: Christeen Douglas, MD;  Location: [facility] ORS;  Service:  Gynecology;  Laterality: N/A;   TONSILLECTOMY AND ADENOIDECTOMY  1962   TUBAL LIGATION  1993   tubaligation  1990   Patient Active Problem List   Diagnosis Date Noted   Knee pain 11/08/2022   Tremor 11/08/2022   Ankle pain 04/12/2022   Chronic venous insufficiency 04/08/2022   Major depressive disorder, single episode, mild (HCC) 02/14/2022   Groin hematoma 02/14/2022   Enlarged lymph node in neck 10/12/2021   Aortic atherosclerosis (HCC) 10/12/2021   History of COVID-19 08/09/2021   Abdominal pain 11/30/2020   Toenail fungus 11/24/2020   Back pain 11/24/2020   Carotid artery disease (HCC) 07/28/2020   Leg skin lesion, right 03/24/2020   Shingles 12/10/2019   Abnormal mammogram 12/02/2019   Thickened endometrium 10/29/2019   Hematochezia 11/11/2018   Rectal polyp 10/28/2018   Vitamin D deficiency 01/18/2018   Iron deficiency anemia secondary to inadequate dietary iron intake 01/17/2018   Easy bruising 12/23/2017   Anemia 12/23/2017   Flexor carpi ulnaris tenosynovitis 12/03/2017   Carpal tunnel syndrome, right 10/15/2017   Ganglion of right wrist 10/15/2017   Leg length inequality 10/15/2017   Lumbar spondylosis 10/15/2017   Osteoporosis without  current pathological fracture 09/09/2017   Heart murmur 09/09/2017   Mild depression 09/09/2017   Crohn disease (HCC)    Chest pain 04/12/2017   Right shoulder pain 01/16/2017   History of frequent urinary tract infections 08/09/2016   Heart palpitations 08/03/2016   Weakness 07/02/2016   Light headedness 04/17/2016   Coronary artery disease of native artery of native heart with stable angina pectoris (HCC)    Hip pain 04/13/2016   Health care maintenance 01/19/2016   Essential hypertension 11/07/2015   Hypercholesterolemia 11/07/2015   Migraine headache 11/07/2015   Nephrolithiasis 11/07/2015   GERD (gastroesophageal reflux disease) 11/07/2015   Mitral regurgitation 11/07/2015   Stress 11/07/2015   Varicose veins of  both lower extremities with pain 05/10/2015   Pharyngoesophageal dysphagia 03/11/2015   Pancreatic lesion 04/25/2014   Hemangioma of liver 03/12/2014   Arthralgia 05/29/2011    PCP/REFERRING PROVIDER: Dale Waverly, MD  REFERRING DIAG: Right shoulder pain, unspecified chronicity  THERAPY DIAG:  Chronic right shoulder pain  Impingement syndrome of right shoulder  Weakness of right shoulder  Stiffness of right shoulder, not elsewhere classified  Rationale for Evaluation and Treatment: Rehabilitation  ONSET DATE: August 2023  SUBJECTIVE:                                                                                                                                                                                      SUBJECTIVE STATEMENT: Pt reports to PT with c/c of chronic R shoulder pain. She reports helping her family set up a venue for a wedding and feeling like she "overdid" it with her shoulder; pt also reports continuing to do a lot of house and yard work with her shoulder which she feels didn't help the pain. She reports feeling "tingling" in her fingers however says she has had that for a while and it isn't a new occurrence since her shoulder pain started. Pt denies any previous episodes however says she did fracture her R shoulder a few years ago from a fall. No significant change in 24 hour behavior but pt reports her shoulder feels more stiff/painful after extended periods of rest. Hand dominance: Right  PERTINENT HISTORY: L meniscus tear, Crohn's disease, hx of 3 MI's and 5 stent placements (2022 most recent), Osteopenia  PAIN:  Are you having pain? Yes: NPRS scale: 1/10 now, 7/10 worst Pain location: R shoulder Pain description: Achy, dull, sore Aggravating factors: Reaching overhead, reaching forward, lifting objects Relieving factors: Ice  PRECAUTIONS: None  RED FLAGS: None   WEIGHT BEARING RESTRICTIONS: No  FALLS:  Has patient fallen in last 6 months?  No  LIVING ENVIRONMENT: Lives with: lives alone Lives  in: House/apartment Stairs: Yes: External: 4 steps; can reach both Has following equipment at home: Dan Humphreys - 4 wheeled  OCCUPATION: Retired Runner, broadcasting/film/video; does a lot of yard-work and volunteering for her church  PLOF: Independent  PATIENT GOALS: Be able to perform all yardwork/housework and community activities with no pain  NEXT MD VISIT: TBD  OBJECTIVE:  Note: Objective measures were completed at Evaluation unless otherwise noted.  DIAGNOSTIC FINDINGS:  N/A  PATIENT SURVEYS:  FOTO 46/64  COGNITION: Overall cognitive status: Within functional limits for tasks assessed     SENSATION: WFL  POSTURE: Rounded shoulders bilaterall  UPPER EXTREMITY ROM:   Active ROM Right eval Left eval  Shoulder flexion 161 ! 172  Shoulder extension [facility]  Shoulder abduction 121 ! 144  Shoulder adduction    Shoulder internal rotation 81 ! 74  Shoulder external rotation 71 ! 84  Elbow flexion WFL WFL  Elbow extension [facility] WFL  Wrist flexion    Wrist extension    Wrist ulnar deviation    Wrist radial deviation    Wrist pronation    Wrist supination    (Blank rows = not tested)  UPPER EXTREMITY MMT:  MMT Right eval Left eval  Shoulder flexion 4- ! 5  Shoulder extension 5 ! 5  Shoulder abduction 4- ! 5  Shoulder adduction    Shoulder internal rotation 5 5  Shoulder external rotation 3+ ! 5  Middle trapezius    Lower trapezius    Elbow flexion 5 5  Elbow extension 5 5  Wrist flexion    Wrist extension    Wrist ulnar deviation    Wrist radial deviation    Wrist pronation    Wrist supination    Grip strength (lbs)    (Blank rows = not tested)  SHOULDER SPECIAL TESTS: Impingement tests: Neer impingement test: positive , Hawkins/Kennedy impingement test: positive , and Painful arc test: positive  Biceps assessment: Speed's test: positive   JOINT MOBILITY TESTING:  To be assessed next session  PALPATION:  TTP  along R clavicle as well as near biceps tendon insertion site   TODAY'S TREATMENT:                                                                                                                                         DATE: 09/29/2023  Subjective: Pt reports  pain at rest, still experiences 5/10 pain with rest and movements. Pt stated: " I over did house work again."  Objective: Pt educated again on importance of activity modification and taking rest/ice breaks when she feels pain in her R shoulder  Therex: HEP review again to ensure proper form and no pain with exercises (pulley, supination, isometrics) UBE 3 min forward  Supine wand ex.: flexion/ chest press #2 10x each (pain limited).  Side Lying Arm Abd #2 2 x 10 reps.  Seated ER/IR #2 10x2 each (  no pain).   Scapular retraction 2x 10 reps Supine R shoulder manual isometrics (all planes of sh. ROM/ bicep flexion/ tricep extension).     Manual Therapy: Supine R shoulder Inferior/ posterior Glides: grades 1-2 to decrease pain x 8 minutes   Seated STM with hypervolt to L/R upper trap./ shoulder musculature x 7 minutes  Cold pack applied to R shoulder x 5 minutes   PATIENT EDUCATION: Education details: HEP form and frequency and the importance of keeping exercises pain-free, role of PT, plan of care Person educated: Patient Education method: Explanation, Demonstration, and Handouts Education comprehension: returned demonstration  HOME EXERCISE PROGRAM: Access Code: [email or web address] URL: [email or web address] Date: 09/10/2023 Prepared by: Dorene Grebe Exercises - Doorway Pec Stretch at 90 Degrees Abduction - 2 x daily - 4-5 x weekly - 2 sets - 10 reps - 30 seconds hold - Standing Isometric Shoulder Flexion with Doorway - 2 x daily - 4-5 x weekly - 2 sets - 10 reps - 2-3 seconds hold - Standing Isometric Shoulder Abduction with Doorway - 2 x daily - 4-5 x weekly - 2 sets - 10 reps - 2-3 seconds hold - Standing Isometric Shoulder  Internal Rotation at Doorway - 2 x daily - 4-5 x weekly - 2 sets - 10 reps - 2-3 seconds hold - Standing Isometric Shoulder External Rotation with Doorway - 2 x daily - 4-5 x weekly - 2 sets - 10 reps - 2-3 seconds hold - Forearm Supination with Resistance - 2 x daily - 4-5 x weekly - 2 sets - 10 reps - Standing Median Nerve Glide - 2 x daily - 7 x weekly - 2 sets - 10 reps - Ulnar Nerve Flossing - 2 x daily - 7 x weekly - 2 sets - 10 reps   ASSESSMENT:  CLINICAL IMPRESSION: Pt cont to reports to PT with no significant improvements in R shoulder pain, movement and strength since last session. Pt reported late and was upset that  she had to wait 5 mins after arrival. She remains pain limited with repeated overhead reaching and increased pain at ~90 degrees of flexion/abduction/scaption. ER/IR to 70 degrees. Pt is guarded and wants to see the Orthopedic for her shoulder to continue to live an active life. HEP reviewed again (pulley, isometrics) to ensure pt was performing with proper form and keeping the exercises pain-free. Pt continues to be educated on importance of activity modification and keeping everything pain-free at home to ease irritability. Pt has an appointment next week the Ortho MD and MRI test.   Pt will benefit from continued PT services upon discharge to safely address deficits listed in patient problem list for decreased caregiver assistance and eventual return to PLOF.   OBJECTIVE IMPAIRMENTS: decreased ROM, decreased strength, hypomobility, impaired UE functional use, postural dysfunction, and pain.   ACTIVITY LIMITATIONS: lifting, reach over head, hygiene/grooming, and caring for others  PARTICIPATION LIMITATIONS: cleaning, laundry, community activity, yard work, and church  PERSONAL FACTORS: Age, Time since onset of injury/illness/exacerbation, and 3+ comorbidities: Crohn's disease, hx of MI's, osteopenia  are also affecting patient's functional outcome.   REHAB POTENTIAL:  Good  CLINICAL DECISION MAKING: Stable/uncomplicated  EVALUATION COMPLEXITY: Low   GOALS:  SHORT TERM GOALS: Target date: 09/28/2023  Pt will be independent and compliant with HEP in order to improve R shoulder AROM by at least 10 degrees in all planes. Baseline: See above for initial R shoulder AROM measurements Goal status: INITIAL   LONG TERM GOALS: Target date: 10/26/2023  Pt will improve FOTO to at least 64 in order to demonstrate improvement in pain and function with ADL's. Baseline: 46 Goal status: INITIAL  2.  Pt will demonstrate full and pain-free R shoulder AROM in all planes in order to improve function with overhead reaching and lifting tasks at home. Baseline: See above for initial R shoulder AROM measurements Goal status: INITIAL  3.  Pt will increase all R shoulder MMT scores to at least 4+/5 with no pain in order to show improved strength needed to perform yardwork and church activities. Baseline: See above for initial MMT scores Goal status: INITIAL  4.  Pt will demonstrate negative impingement and Speed's tests in order to show improvement in R shoulder pain. Baseline: (+) HK, Neer's, Painful Arc, Speed's Goal status: INITIAL  5.  Pt will demonstrate negative Full/Empty Can testing in order to show improvement in rotator cuff pain/pathology. Baseline: (+) full/empty can on R side (10/29) Goal status: INITIAL   PLAN:  PT FREQUENCY: 2x/week  PT DURATION: 8 weeks  PLANNED INTERVENTIONS: 97110-Therapeutic exercises, 97530- Therapeutic activity, 97112- Neuromuscular re-education, 97140- Manual therapy, Joint mobilization, Cryotherapy, and Moist heat  PLAN FOR NEXT SESSION: check goals/ f/u on note to Dr. Domenic Moras PT DPT 3:18 PM,09/29/23

## 2023-09-30 ENCOUNTER — Encounter: Payer: Medicare PPO | Admitting: Physical Therapy

## 2023-10-01 DIAGNOSIS — K6289 Other specified diseases of anus and rectum: Secondary | ICD-10-CM | POA: Diagnosis not present

## 2023-10-01 DIAGNOSIS — I2510 Atherosclerotic heart disease of native coronary artery without angina pectoris: Secondary | ICD-10-CM | POA: Diagnosis not present

## 2023-10-01 DIAGNOSIS — K219 Gastro-esophageal reflux disease without esophagitis: Secondary | ICD-10-CM | POA: Diagnosis not present

## 2023-10-01 DIAGNOSIS — K449 Diaphragmatic hernia without obstruction or gangrene: Secondary | ICD-10-CM | POA: Diagnosis not present

## 2023-10-01 DIAGNOSIS — Z860101 Personal history of adenomatous and serrated colon polyps: Secondary | ICD-10-CM | POA: Diagnosis not present

## 2023-10-01 DIAGNOSIS — K5010 Crohn's disease of large intestine without complications: Secondary | ICD-10-CM | POA: Diagnosis not present

## 2023-10-01 DIAGNOSIS — Z1211 Encounter for screening for malignant neoplasm of colon: Secondary | ICD-10-CM | POA: Diagnosis not present

## 2023-10-01 DIAGNOSIS — Z09 Encounter for follow-up examination after completed treatment for conditions other than malignant neoplasm: Secondary | ICD-10-CM | POA: Diagnosis not present

## 2023-10-01 DIAGNOSIS — K2950 Unspecified chronic gastritis without bleeding: Secondary | ICD-10-CM | POA: Diagnosis not present

## 2023-10-01 DIAGNOSIS — K644 Residual hemorrhoidal skin tags: Secondary | ICD-10-CM | POA: Diagnosis not present

## 2023-10-01 DIAGNOSIS — K2100 Gastro-esophageal reflux disease with esophagitis, without bleeding: Secondary | ICD-10-CM | POA: Diagnosis not present

## 2023-10-01 DIAGNOSIS — I252 Old myocardial infarction: Secondary | ICD-10-CM | POA: Diagnosis not present

## 2023-10-01 DIAGNOSIS — K3189 Other diseases of stomach and duodenum: Secondary | ICD-10-CM | POA: Diagnosis not present

## 2023-10-01 DIAGNOSIS — K5090 Crohn's disease, unspecified, without complications: Secondary | ICD-10-CM | POA: Diagnosis not present

## 2023-10-01 DIAGNOSIS — Z8719 Personal history of other diseases of the digestive system: Secondary | ICD-10-CM | POA: Diagnosis not present

## 2023-10-05 ENCOUNTER — Ambulatory Visit (INDEPENDENT_AMBULATORY_CARE_PROVIDER_SITE_OTHER): Payer: Medicare PPO | Admitting: Nurse Practitioner

## 2023-10-05 ENCOUNTER — Ambulatory Visit: Payer: Medicare PPO | Admitting: Physical Therapy

## 2023-10-22 ENCOUNTER — Ambulatory Visit: Payer: Medicare PPO | Admitting: Physical Therapy

## 2023-10-22 DIAGNOSIS — I10 Essential (primary) hypertension: Secondary | ICD-10-CM | POA: Diagnosis not present

## 2023-10-22 DIAGNOSIS — R110 Nausea: Secondary | ICD-10-CM | POA: Diagnosis not present

## 2023-10-22 DIAGNOSIS — R9431 Abnormal electrocardiogram [ECG] [EKG]: Secondary | ICD-10-CM | POA: Diagnosis not present

## 2023-10-22 DIAGNOSIS — Z955 Presence of coronary angioplasty implant and graft: Secondary | ICD-10-CM | POA: Diagnosis not present

## 2023-10-22 DIAGNOSIS — G4489 Other headache syndrome: Secondary | ICD-10-CM | POA: Diagnosis not present

## 2023-10-22 DIAGNOSIS — R42 Dizziness and giddiness: Secondary | ICD-10-CM | POA: Diagnosis not present

## 2023-10-22 DIAGNOSIS — R002 Palpitations: Secondary | ICD-10-CM | POA: Diagnosis not present

## 2023-10-22 DIAGNOSIS — R079 Chest pain, unspecified: Secondary | ICD-10-CM | POA: Diagnosis not present

## 2023-10-23 DIAGNOSIS — R079 Chest pain, unspecified: Secondary | ICD-10-CM | POA: Diagnosis not present

## 2023-10-23 DIAGNOSIS — R202 Paresthesia of skin: Secondary | ICD-10-CM | POA: Diagnosis not present

## 2023-10-27 DIAGNOSIS — I214 Non-ST elevation (NSTEMI) myocardial infarction: Secondary | ICD-10-CM | POA: Diagnosis not present

## 2023-10-27 DIAGNOSIS — I2510 Atherosclerotic heart disease of native coronary artery without angina pectoris: Secondary | ICD-10-CM | POA: Diagnosis not present

## 2023-10-27 DIAGNOSIS — T82855A Stenosis of coronary artery stent, initial encounter: Secondary | ICD-10-CM | POA: Diagnosis not present

## 2023-10-27 DIAGNOSIS — I739 Peripheral vascular disease, unspecified: Secondary | ICD-10-CM | POA: Diagnosis not present

## 2023-10-27 DIAGNOSIS — I471 Supraventricular tachycardia, unspecified: Secondary | ICD-10-CM | POA: Diagnosis not present

## 2023-10-27 DIAGNOSIS — Z955 Presence of coronary angioplasty implant and graft: Secondary | ICD-10-CM | POA: Diagnosis not present

## 2023-10-27 DIAGNOSIS — I10 Essential (primary) hypertension: Secondary | ICD-10-CM | POA: Diagnosis not present

## 2023-10-27 DIAGNOSIS — I6523 Occlusion and stenosis of bilateral carotid arteries: Secondary | ICD-10-CM | POA: Diagnosis not present

## 2023-10-27 DIAGNOSIS — E782 Mixed hyperlipidemia: Secondary | ICD-10-CM | POA: Diagnosis not present

## 2023-11-08 ENCOUNTER — Encounter: Payer: Self-pay | Admitting: Internal Medicine

## 2023-11-08 ENCOUNTER — Other Ambulatory Visit: Payer: Self-pay | Admitting: Internal Medicine

## 2023-11-08 DIAGNOSIS — I2510 Atherosclerotic heart disease of native coronary artery without angina pectoris: Secondary | ICD-10-CM

## 2023-11-09 DIAGNOSIS — E782 Mixed hyperlipidemia: Secondary | ICD-10-CM | POA: Diagnosis not present

## 2023-11-09 DIAGNOSIS — Z955 Presence of coronary angioplasty implant and graft: Secondary | ICD-10-CM | POA: Diagnosis not present

## 2023-11-09 DIAGNOSIS — I6523 Occlusion and stenosis of bilateral carotid arteries: Secondary | ICD-10-CM | POA: Diagnosis not present

## 2023-11-09 DIAGNOSIS — I10 Essential (primary) hypertension: Secondary | ICD-10-CM | POA: Diagnosis not present

## 2023-11-09 DIAGNOSIS — R002 Palpitations: Secondary | ICD-10-CM | POA: Diagnosis not present

## 2023-11-09 DIAGNOSIS — I214 Non-ST elevation (NSTEMI) myocardial infarction: Secondary | ICD-10-CM | POA: Diagnosis not present

## 2023-11-09 DIAGNOSIS — I2510 Atherosclerotic heart disease of native coronary artery without angina pectoris: Secondary | ICD-10-CM | POA: Diagnosis not present

## 2023-11-09 DIAGNOSIS — I471 Supraventricular tachycardia, unspecified: Secondary | ICD-10-CM | POA: Diagnosis not present

## 2023-11-09 DIAGNOSIS — T82855A Stenosis of coronary artery stent, initial encounter: Secondary | ICD-10-CM | POA: Diagnosis not present

## 2023-11-12 ENCOUNTER — Telehealth: Payer: Self-pay | Admitting: *Deleted

## 2023-11-12 ENCOUNTER — Other Ambulatory Visit (INDEPENDENT_AMBULATORY_CARE_PROVIDER_SITE_OTHER): Payer: Medicare PPO

## 2023-11-12 DIAGNOSIS — E7800 Pure hypercholesterolemia, unspecified: Secondary | ICD-10-CM

## 2023-11-12 LAB — LIPID PANEL
Cholesterol: 148 mg/dL (ref 0–200)
HDL: 57.2 mg/dL (ref >39.00)
LDL Cholesterol: 67 mg/dL (ref 0–99)
NonHDL: 90.97
Total CHOL/HDL Ratio: 3
Triglycerides: 121 mg/dL (ref 0.0–149.0)
VLDL: 24.2 mg/dL (ref 0.0–40.0)

## 2023-11-12 LAB — BASIC METABOLIC PANEL
BUN: 20 mg/dL (ref 6–23)
CO2: 28 meq/L (ref 19–32)
Calcium: 9.3 mg/dL (ref 8.4–10.5)
Chloride: 103 meq/L (ref 96–112)
Creatinine, Ser: 0.83 mg/dL (ref 0.40–1.20)
GFR: 72.27 mL/min (ref >60.00)
Glucose, Bld: 94 mg/dL (ref 70–99)
Potassium: 3.7 meq/L (ref 3.5–5.1)
Sodium: 140 meq/L (ref 135–145)

## 2023-11-12 LAB — HEPATIC FUNCTION PANEL
ALT: 20 U/L (ref 0–35)
AST: 17 U/L (ref 0–37)
Albumin: 4.3 g/dL (ref 3.5–5.2)
Alkaline Phosphatase: 81 U/L (ref 39–117)
Bilirubin, Direct: 0.1 mg/dL (ref 0.0–0.3)
Total Bilirubin: 0.6 mg/dL (ref 0.2–1.2)
Total Protein: 7 g/dL (ref 6.0–8.3)

## 2023-11-12 NOTE — Telephone Encounter (Signed)
 Patient is not confused. She did go to Dr Florencio. She did not see him but she did go over there to return her zio monitor and told his nurse what was going on so the nurse consulted with Dr Florencio. He is aware of what is going on. She is going to see you on 1/7 and then has a f/u with cards on 1/13. Advised patient if anything worsening or acute, needs to be evaluated ASAP. Pt gave verbal understanding.

## 2023-11-12 NOTE — Telephone Encounter (Signed)
 Pt came in for labs this morning & the front desk noticed that she wasn't herself. She kept closing her eyes and blinking a lot. She told the front desk that she had no energy because her blood pressure spiked throughout the night and it wears her out.  I walked patient to the lab & drew her blood. She denied any sob, chest plain, dizziness, lightheadedness, etc.   She also mentioned to me that her BP spikes from time to time & this isn't new. She told me she was heading to cardiology today to have her Zio monitor removed by Dr. Florencio office.  I asked pt if we could take a look at her and have her checked out & she politely refused. She did agreed to let me check her vitals:  BP-140/80 P-69 O2-95%  I walked patient to her car & I called Dr. Florencio office & spoke with one of his nurses. She notified me that she has no appt scheduled with Ms Reinheimer today & they do not remove Zio monitors there. But she would keep an eye out for Ms Friel.

## 2023-11-12 NOTE — Telephone Encounter (Signed)
 Reviewed note. Please call Morgan Moore and see if she is doing ok.  Please confirm either with her or with cardiology - if she showed up for appt.  I want to make sure she is not confused. If confusion, or acute change, etc - needs to be seen.

## 2023-11-16 ENCOUNTER — Ambulatory Visit (INDEPENDENT_AMBULATORY_CARE_PROVIDER_SITE_OTHER): Payer: Medicare PPO | Admitting: Internal Medicine

## 2023-11-16 VITALS — BP 120/70 | HR 75 | Temp 98.0°F | Resp 16 | Ht 61.0 in | Wt 163.2 lb

## 2023-11-16 DIAGNOSIS — I10 Essential (primary) hypertension: Secondary | ICD-10-CM

## 2023-11-16 DIAGNOSIS — G478 Other sleep disorders: Secondary | ICD-10-CM

## 2023-11-16 DIAGNOSIS — K50919 Crohn's disease, unspecified, with unspecified complications: Secondary | ICD-10-CM

## 2023-11-16 DIAGNOSIS — F439 Reaction to severe stress, unspecified: Secondary | ICD-10-CM

## 2023-11-16 DIAGNOSIS — F320 Major depressive disorder, single episode, mild: Secondary | ICD-10-CM | POA: Diagnosis not present

## 2023-11-16 DIAGNOSIS — E7800 Pure hypercholesterolemia, unspecified: Secondary | ICD-10-CM | POA: Diagnosis not present

## 2023-11-16 DIAGNOSIS — I779 Disorder of arteries and arterioles, unspecified: Secondary | ICD-10-CM

## 2023-11-16 DIAGNOSIS — I700 Atherosclerosis of aorta: Secondary | ICD-10-CM

## 2023-11-16 DIAGNOSIS — K219 Gastro-esophageal reflux disease without esophagitis: Secondary | ICD-10-CM | POA: Diagnosis not present

## 2023-11-16 DIAGNOSIS — I25118 Atherosclerotic heart disease of native coronary artery with other forms of angina pectoris: Secondary | ICD-10-CM | POA: Diagnosis not present

## 2023-11-16 DIAGNOSIS — Z0000 Encounter for general adult medical examination without abnormal findings: Secondary | ICD-10-CM

## 2023-11-16 NOTE — Progress Notes (Signed)
 Subjective:    Patient ID: Morgan Moore, female    DOB: Mar 14, 1956, 68 y.o.   MRN: [id number]  Patient here for  Chief Complaint  Patient presents with   Medical Management of Chronic Issues    HPI Here for her physical exam. Recently has had issues with increased blood pressure - am. Described the episodes - waking her up from sleep. Noticed increased heart beat. Heart beating hard. Was evaluated in ER.  Also, Dr Florencio aware and increased verapamil  to 180mg  daily with verapamil  40mg  prn systolic blood pressure 140 or greater. She is working herself up to 180mg .  Saw Dr Florencio 10/27/23 - per review - vague anginal symptoms and blood pressure change.  Recommended cardiac MRI (12/01/23).   On losartan. Had monitor placed. Waiting for results. Not sleeping well. Discussed the episodes - occurring during the night. Discussed possible sleep apnea. Agreeable for evaluation.     Past Medical History:  Diagnosis Date   Allergy    Anemia    Anxiety    Chicken pox    Complication of anesthesia    vomitting   Coronary artery disease    Crohn disease (HCC)    DVT (deep venous thrombosis) (HCC) 2017   Dyspnea    on excertion   Dysrhythmia 2019   palpitations   GERD (gastroesophageal reflux disease)    Heart disease    Heart murmur    Heart murmur    History of kidney stones    Hyperlipidemia    Hypertension    Inflammatory bowel disease    Migraines    hormonal, puberty   Myocardial infarction (HCC)    Phlebitis    PONV (postoperative nausea and vomiting)    Past Surgical History:  Procedure Laterality Date   BREAST BIOPSY Left 07/05/2019   FIBROEPITHELIAL lesion/neg   BREAST CYST ASPIRATION     unsure of side   CARPAL TUNNEL RELEASE Right 12/02/2017   Procedure: CARPAL TUNNEL RELEASE ENDOSCOPIC;  Surgeon: Edie Norleen PARAS, MD;  Location: [facility] ORS;  Service: Orthopedics;  Laterality: Right;   CORONARY ANGIOPLASTY WITH STENT PLACEMENT  2017   x 2   EYE SURGERY  Bilateral    cataract surgery   GANGLION CYST EXCISION Right 12/02/2017   Procedure: REMOVAL GANGLION OF WRIST;  Surgeon: Edie Norleen PARAS, MD;  Location: [facility] OR[facility];  Service: Orthopedics;  Laterality: Right;   heart murmur     HYSTEROSCOPY WITH D & C N/A 04/19/2020   Procedure: DILATATION AND CURETTAGE /HYSTEROSCOPY, POSSIBLE  POLYPECTOMY;  Surgeon: Verdon Keen, MD;  Location: [facility] OR[facility];  Service: Gynecology;  Laterality: N/A;   TONSILLECTOMY AND ADENOIDECTOMY  1962   TUBAL LIGATION  1993   tubaligation  1990   Family History  Problem Relation Age of Onset   Heart disease Father    Heart disease Brother    Cancer Maternal Aunt    Breast cancer Maternal Aunt    Stroke Mother    Kidney disease Neg Hx    GU problems Neg Hx    Kidney cancer Neg Hx    Social History   Socioeconomic History   Marital status: Single    Spouse name: Not on file   Number of children: Not on file   Years of education: Not on file   Highest education level: Not on file  Occupational History   Not on file  Tobacco Use   Smoking status: Never   Smokeless tobacco: Never  Vaping Use  Vaping status: Never Used  Substance and Sexual Activity   Alcohol use: No    Alcohol/week: 0.0 standard drinks of alcohol   Drug use: No   Sexual activity: Not on file  Other Topics Concern   Not on file  Social History Narrative   Had 2 kids. Daughter died in November 04, 2012 son living    Former writer    Social Drivers of Health   Financial Resource Strain: Low Risk  (04/19/2023)   Overall Financial Resource Strain (CARDIA)    Difficulty of Paying Living Expenses: Not hard at all  Food Insecurity: No Food Insecurity (04/19/2023)   Hunger Vital Sign    Worried About Running Out of Food in the Last Year: Never true    Ran Out of Food in the Last Year: Never true  Transportation Needs: No Transportation Needs (04/19/2023)   PRAPARE - Administrator, Civil Service (Medical): No    Lack of  Transportation (Non-Medical): No  Physical Activity: Sufficiently Active (04/19/2023)   Exercise Vital Sign    Days of Exercise per Week: 5 days    Minutes of Exercise per Session: 30 min  Recent Concern: Physical Activity - Insufficiently Active (04/19/2023)   Exercise Vital Sign    Days of Exercise per Week: 3 days    Minutes of Exercise per Session: 30 min  Stress: No Stress Concern Present (04/19/2023)   [employer] of Occupational Health - Occupational Stress Questionnaire    Feeling of Stress : Not at all  Social Connections: Unknown (04/19/2023)   Social Connection and Isolation Panel [NHANES]    Frequency of Communication with Friends and Family: More than three times a week    Frequency of Social Gatherings with Friends and Family: Twice a week    Attends Religious Services: More than 4 times per year    Active Member of [employer] or Organizations: No    Attends Banker Meetings: Never    Marital Status: Not on file     Review of Systems  Constitutional:  Negative for appetite change and unexpected weight change.  HENT:  Negative for congestion and sinus pressure.   Respiratory:  Negative for cough and chest tightness.        Breathing overall relatively stable.   Cardiovascular:  Negative for chest pain and leg swelling.       Intermittent increased heart beat as outlined.   Gastrointestinal:  Negative for abdominal pain, diarrhea, nausea and vomiting.  Genitourinary:  Negative for difficulty urinating and dysuria.  Musculoskeletal:  Negative for joint swelling and myalgias.  Skin:  Negative for color change and rash.  Neurological:  Negative for dizziness and headaches.  Psychiatric/Behavioral:  Negative for agitation and dysphoric mood.        Objective:     BP 120/70   Pulse 75   Temp 98 F (36.7 C)   Resp 16   Ht 5' 1 (1.549 m)   Wt 163 lb 3.2 oz (74 kg)   SpO2 98%   BMI 30.84 kg/m  Wt Readings from Last 3 Encounters:  11/16/23 163 lb 3.2  oz (74 kg)  07/08/23 165 lb 3.2 oz (74.9 kg)  04/19/23 164 lb (74.4 kg)    Physical Exam Vitals reviewed.  Constitutional:      General: She is not in acute distress.    Appearance: Normal appearance.  HENT:     Head: Normocephalic and atraumatic.     Right Ear: External ear  normal.     Left Ear: External ear normal.     Mouth/Throat:     Pharynx: No oropharyngeal exudate or posterior oropharyngeal erythema.  Eyes:     General: No scleral icterus.       Right eye: No discharge.        Left eye: No discharge.     Conjunctiva/sclera: Conjunctivae normal.  Neck:     Thyroid : No thyromegaly.  Cardiovascular:     Rate and Rhythm: Normal rate and regular rhythm.  Pulmonary:     Effort: No respiratory distress.     Breath sounds: Normal breath sounds. No wheezing.  Abdominal:     General: Bowel sounds are normal.     Palpations: Abdomen is soft.     Tenderness: There is no abdominal tenderness.  Musculoskeletal:        General: No swelling or tenderness.     Cervical back: Neck supple. No tenderness.  Lymphadenopathy:     Cervical: No cervical adenopathy.  Skin:    Findings: No erythema or rash.  Neurological:     Mental Status: She is alert.  Psychiatric:        Mood and Affect: Mood normal.        Behavior: Behavior normal.      Outpatient Encounter Medications as of 11/16/2023  Medication Sig   verapamil  (VERELAN ) 180 MG 24 hr capsule Take 180 mg by mouth daily.   aspirin  EC 81 MG tablet Take 81 mg by mouth daily.   Cholecalciferol 25 MCG (1000 UT) tablet Take 1,000 Units by mouth 2 (two) times daily.   clopidogrel  (PLAVIX ) 75 MG tablet Take 75 mg by mouth daily.   cyanocobalamin  1000 MCG tablet Take 1,000 mcg by mouth daily at 12 noon.    diphenhydrAMINE (BENADRYL) 12.5 MG/5ML elixir Take 12.5 mg by mouth every 6 (six) hours as needed (allergic reaction).   EPINEPHrine  0.3 mg/0.3 mL IJ SOAJ injection Inject 0.3 mg into the muscle once.    escitalopram  (LEXAPRO ) 20  MG tablet TAKE ONE TABLET BY MOUTH ONCE DAILY   Fe Fum-FePoly-Vit C-Vit B3 (INTEGRA) 62.5-62.5-40-3 MG CAPS TAKE ONE CAPSULE BY MOUTH three times WEEKLY.   fluocinonide (LIDEX) 0.05 % external solution Apply 1 Application topically 2 (two) times daily.   losartan (COZAAR) 25 MG tablet Take 25 mg by mouth daily.   Melatonin-Pyridoxine 3-10 MG TABS Take 1 tablet by mouth at bedtime.   mesalamine  (LIALDA ) 1.2 g EC tablet Take 1.2 g by mouth 3 (three) times daily with meals.    mupirocin  ointment (BACTROBAN ) 2 % Apply to affected area bid   nitroGLYCERIN  (NITROSTAT ) 0.4 MG SL tablet DISSOLVE (1) TABLET UNDER TONGUE AS NEEDED TO RELIEVE CHEST PAIN. MAYREPEAT EVERY 5 MINUTES.   ondansetron  (ZOFRAN -ODT) 4 MG disintegrating tablet Take 1 tablet (4 mg total) by mouth daily as needed for nausea.   pantoprazole (PROTONIX) 20 MG tablet Take 1 tablet by mouth daily.   prednisoLONE  acetate (PRED FORTE ) 1 % ophthalmic suspension Place 1 drop into both eyes 2 (two) times daily as needed (for inflammation).   REPATHA 140 MG/ML SOSY Inject into the skin.   rosuvastatin  (CRESTOR ) 5 MG tablet TAKE ONE TABLET BY MOUTH THREE TIMES A WEEK   verapamil  (CALAN ) 40 MG tablet Take 40 mg by mouth every 6 (six) hours as needed.   [DISCONTINUED] verapamil  (CALAN -SR) 120 MG CR tablet Take 120 mg by mouth every evening.    No facility-administered encounter medications on file as  of 11/16/2023.     Lab Results  Component Value Date   WBC 7.9 10/26/2022   HGB 13.4 10/26/2022   HCT 39.3 10/26/2022   PLT 320.0 10/26/2022   GLUCOSE 94 11/12/2023   CHOL 148 11/12/2023   TRIG 121.0 11/12/2023   HDL 57.20 11/12/2023   LDLCALC 67 11/12/2023   ALT 20 11/12/2023   AST 17 11/12/2023   NA 140 11/12/2023   K 3.7 11/12/2023   CL 103 11/12/2023   CREATININE 0.83 11/12/2023   BUN 20 11/12/2023   CO2 28 11/12/2023   TSH 1.91 06/30/2023   INR 1.0 12/23/2017    MM 3D DIAGNOSTIC MAMMOGRAM UNILATERAL RIGHT BREAST Result Date:  08/17/2023 CLINICAL DATA:  Screening recall for right breast asymmetries. EXAM: DIGITAL DIAGNOSTIC UNILATERAL RIGHT MAMMOGRAM WITH TOMOSYNTHESIS AND CAD; ULTRASOUND RIGHT BREAST LIMITED TECHNIQUE: Right digital diagnostic mammography and breast tomosynthesis was performed. The images were evaluated with computer-aided detection. ; Targeted ultrasound examination of the right breast was performed COMPARISON:  Previous exam(s). ACR Breast Density Category c: The breasts are heterogeneously dense, which may obscure small masses. FINDINGS: Spot compression tomosynthesis images through the superior right breast demonstrates an obscured oval mass measuring approximately 1.5 cm. Images through the upper outer quadrant of the right breast demonstrate no definite additional masses, however the tissue is heterogeneously dense in this region, and therefore ultrasound will be performed for further evaluation. Ultrasound targeted to the right breast at oval 1 o'clock, 4 cm from the nipple demonstrates an anechoic oval circumscribed mass measuring 1.2 x 0.5 x 1.2 cm. The patient states that she has tenderness at the site of this cyst. In the upper-outer quadrant of the right breast there are a few subcentimeter anechoic cysts measuring 3 mm. IMPRESSION: Benign fibrocystic changes in the right breast. RECOMMENDATION: Screening mammogram in one year.(Code:SM-B-01Y) I have discussed the findings and recommendations with the patient. If applicable, a reminder letter will be sent to the patient regarding the next appointment. BI-RADS CATEGORY  2: Benign. Electronically Signed   By: Rosaline Collet M.D.   On: 08/17/2023 14:17   US  LIMITED ULTRASOUND INCLUDING AXILLA RIGHT BREAST Result Date: 08/17/2023 CLINICAL DATA:  Screening recall for right breast asymmetries. EXAM: DIGITAL DIAGNOSTIC UNILATERAL RIGHT MAMMOGRAM WITH TOMOSYNTHESIS AND CAD; ULTRASOUND RIGHT BREAST LIMITED TECHNIQUE: Right digital diagnostic mammography and  breast tomosynthesis was performed. The images were evaluated with computer-aided detection. ; Targeted ultrasound examination of the right breast was performed COMPARISON:  Previous exam(s). ACR Breast Density Category c: The breasts are heterogeneously dense, which may obscure small masses. FINDINGS: Spot compression tomosynthesis images through the superior right breast demonstrates an obscured oval mass measuring approximately 1.5 cm. Images through the upper outer quadrant of the right breast demonstrate no definite additional masses, however the tissue is heterogeneously dense in this region, and therefore ultrasound will be performed for further evaluation. Ultrasound targeted to the right breast at oval 1 o'clock, 4 cm from the nipple demonstrates an anechoic oval circumscribed mass measuring 1.2 x 0.5 x 1.2 cm. The patient states that she has tenderness at the site of this cyst. In the upper-outer quadrant of the right breast there are a few subcentimeter anechoic cysts measuring 3 mm. IMPRESSION: Benign fibrocystic changes in the right breast. RECOMMENDATION: Screening mammogram in one year.(Code:SM-B-01Y) I have discussed the findings and recommendations with the patient. If applicable, a reminder letter will be sent to the patient regarding the next appointment. BI-RADS CATEGORY  2: Benign. Electronically Signed  By: Rosaline Collet M.D.   On: 08/17/2023 14:17       Assessment & Plan:  Health care maintenance Assessment & Plan: Physical today 11/16/23.  Mammogram 08/13/23- Birads 0.  F/u right breast mammogram and ultrasound 08/17/23 - Birads II. Recommend screening mammogram in one year. Colonoscopy 08/2018.     Essential hypertension Assessment & Plan: On verapamil . Recently dose adjusted as outlined. She is working herself up to 180mg  dose.   Follow pressures.  Follow metabolic panel. Hold on making changes in her medication. Check renal artery duplex, given fluctuations in blood pressure  as outlined.   Orders: -     Ambulatory referral to Pulmonology -     VAS US  RENAL ARTERY DUPLEX; Future  Carotid artery disease, unspecified laterality, unspecified type (HCC) Assessment & Plan: Continue crestor  and aspirin .  Continue risk factor modification.  Saw Dr Jama 04/2022 - Given the patient's asymptomatic subcritical stenosis no further invasive testing or surgery recommended. Duplex ultrasound shows 1-39% stenosis bilaterally (04/08/23). Continue antiplatelet therapy as prescribed Continue management of CAD, HTN and Hyperlipidemia. Follow up in 12 months with duplex ultrasound and physical exam   Orders: -     VAS US  RENAL ARTERY DUPLEX; Future  Coronary artery disease of native artery of native heart with stable angina pectoris [facility]) Assessment & Plan: Continue crestor  and aspirin .  Continues verapamil  as outlined.  Continue risk factor modification.  Seeing cardiology.   Orders: -     VAS US  RENAL ARTERY DUPLEX; Future  Awakens from sleep at night Assessment & Plan: Describes the episodes waking her up from sleep.  Heart pounding. Elevated blood pressure. Blood pressure fluctuating. Discussed possible sleep apnea and further w/up. She expressed concern regarding possible RAS. She had issues with her blood pressure fluctuating previously. Cardiology just adjusted her medication. She is working herself up to the 180mg . Hold on making changes in her medication. Follow pressures.  Refer to pulmonary for evaluation of possible sleep apnea. Check renal artery duplex.   Orders: -     Ambulatory referral to Pulmonology  Aortic atherosclerosis [facility]) Assessment & Plan: Continue crestor .     Crohn's disease with complication, unspecified gastrointestinal tract location [facility]) Assessment & Plan: Has been on lialda .  Bowels have been relatively stable.  Follow.  Had f/u with GI 05/2023.     Gastroesophageal reflux disease, unspecified whether esophagitis present Assessment &  Plan: No upper symptoms reported.  On protonix.    Hypercholesterolemia Assessment & Plan: Continue crestor .  Low cholesterol diet and exercise. Follow lipid panel and liver function tests.    Major depressive disorder, single episode, mild (HCC) Assessment & Plan: Overall appears to be doing well. On lexapro .  Follow.    Stress Assessment & Plan: Appears to be handling things relatively well.  Continue lexapro .       Allena Hamilton, MD

## 2023-11-16 NOTE — Assessment & Plan Note (Signed)
 Physical today 11/16/23.  Mammogram 08/13/23- Birads 0.  F/u right breast mammogram and ultrasound 08/17/23 - Birads II. Recommend screening mammogram in one year. Colonoscopy 08/2018.

## 2023-11-17 DIAGNOSIS — G478 Other sleep disorders: Secondary | ICD-10-CM | POA: Insufficient documentation

## 2023-11-20 ENCOUNTER — Encounter: Payer: Self-pay | Admitting: Internal Medicine

## 2023-11-20 NOTE — Assessment & Plan Note (Signed)
Appears to be handling things relatively well.  Continue lexapro.

## 2023-11-20 NOTE — Assessment & Plan Note (Signed)
Overall appears to be doing well. On lexapro.  Follow.

## 2023-11-20 NOTE — Assessment & Plan Note (Signed)
 Describes the episodes waking her up from sleep.  Heart pounding. Elevated blood pressure. Blood pressure fluctuating. Discussed possible sleep apnea and further w/up. She expressed concern regarding possible RAS. She had issues with her blood pressure fluctuating previously. Cardiology just adjusted her medication. She is working herself up to the 180mg . Hold on making changes in her medication. Follow pressures.  Refer to pulmonary for evaluation of possible sleep apnea. Check renal artery duplex.

## 2023-11-20 NOTE — Assessment & Plan Note (Signed)
 Continue crestor.  Low cholesterol diet and exercise.  Follow lipid panel and liver function tests.

## 2023-11-20 NOTE — Assessment & Plan Note (Signed)
 On verapamil. Recently dose adjusted as outlined. She is working herself up to 180mg  dose.   Follow pressures.  Follow metabolic panel. Hold on making changes in her medication. Check renal artery duplex, given fluctuations in blood pressure as outlined.

## 2023-11-20 NOTE — Assessment & Plan Note (Signed)
No upper symptoms reported.  On protonix.   

## 2023-11-20 NOTE — Assessment & Plan Note (Signed)
Continue crestor and aspirin.  Continue risk factor modification.  Saw Dr Gilda Crease 04/2022 - Given the patient's asymptomatic subcritical stenosis no further invasive testing or surgery recommended. Duplex ultrasound shows 1-39% stenosis bilaterally (04/08/23). Continue antiplatelet therapy as prescribed Continue management of CAD, HTN and Hyperlipidemia. Follow up in 12 months with duplex ultrasound and physical exam

## 2023-11-20 NOTE — Assessment & Plan Note (Signed)
Has been on lialda.  Bowels have been relatively stable.  Follow.  Had f/u with GI 05/2023.

## 2023-11-20 NOTE — Assessment & Plan Note (Signed)
Continue crestor and aspirin.  Continues verapamil as outlined.  Continue risk factor modification.  Seeing cardiology.

## 2023-11-20 NOTE — Assessment & Plan Note (Signed)
 Continue crestor

## 2023-11-22 DIAGNOSIS — I2510 Atherosclerotic heart disease of native coronary artery without angina pectoris: Secondary | ICD-10-CM | POA: Diagnosis not present

## 2023-11-22 DIAGNOSIS — E7841 Elevated Lipoprotein(a): Secondary | ICD-10-CM | POA: Diagnosis not present

## 2023-11-22 DIAGNOSIS — I10 Essential (primary) hypertension: Secondary | ICD-10-CM | POA: Diagnosis not present

## 2023-11-22 DIAGNOSIS — Z95820 Peripheral vascular angioplasty status with implants and grafts: Secondary | ICD-10-CM | POA: Diagnosis not present

## 2023-11-22 DIAGNOSIS — I700 Atherosclerosis of aorta: Secondary | ICD-10-CM | POA: Diagnosis not present

## 2023-11-22 DIAGNOSIS — T82855D Stenosis of coronary artery stent, subsequent encounter: Secondary | ICD-10-CM | POA: Diagnosis not present

## 2023-11-24 ENCOUNTER — Ambulatory Visit (INDEPENDENT_AMBULATORY_CARE_PROVIDER_SITE_OTHER): Payer: Medicare PPO

## 2023-11-24 DIAGNOSIS — I10 Essential (primary) hypertension: Secondary | ICD-10-CM | POA: Diagnosis not present

## 2023-11-24 DIAGNOSIS — I25118 Atherosclerotic heart disease of native coronary artery with other forms of angina pectoris: Secondary | ICD-10-CM

## 2023-11-24 DIAGNOSIS — I779 Disorder of arteries and arterioles, unspecified: Secondary | ICD-10-CM | POA: Diagnosis not present

## 2023-11-25 ENCOUNTER — Encounter: Payer: Self-pay | Admitting: Internal Medicine

## 2023-11-25 ENCOUNTER — Telehealth: Payer: Self-pay | Admitting: Internal Medicine

## 2023-11-25 NOTE — Telephone Encounter (Signed)
Copied from CRM [id number]. Topic: Clinical - Lab/Test Results >> Nov 25, 2023  3:35 PM Florestine Avers wrote: Reason for CRM: Melissa calling from pulmonary at the [facility] location states that Dr. Belia Heman has suggested a STAT IN LAB sleep study, to be ordered by Dr. Lorin Picket.  Melissa > [phone]

## 2023-11-26 DIAGNOSIS — L570 Actinic keratosis: Secondary | ICD-10-CM | POA: Diagnosis not present

## 2023-11-26 DIAGNOSIS — L578 Other skin changes due to chronic exposure to nonionizing radiation: Secondary | ICD-10-CM | POA: Diagnosis not present

## 2023-11-26 DIAGNOSIS — Z872 Personal history of diseases of the skin and subcutaneous tissue: Secondary | ICD-10-CM | POA: Diagnosis not present

## 2023-11-26 DIAGNOSIS — L739 Follicular disorder, unspecified: Secondary | ICD-10-CM | POA: Diagnosis not present

## 2023-11-26 DIAGNOSIS — D225 Melanocytic nevi of trunk: Secondary | ICD-10-CM | POA: Diagnosis not present

## 2023-11-28 ENCOUNTER — Institutional Professional Consult (permissible substitution): Payer: Medicare PPO | Admitting: Internal Medicine

## 2023-11-29 ENCOUNTER — Ambulatory Visit: Payer: Medicare PPO | Admitting: Internal Medicine

## 2023-11-29 ENCOUNTER — Encounter: Payer: Self-pay | Admitting: Internal Medicine

## 2023-11-29 ENCOUNTER — Encounter (HOSPITAL_COMMUNITY): Payer: Self-pay

## 2023-11-29 VITALS — BP 128/70 | HR 86 | Temp 98.2°F | Ht 61.0 in | Wt 165.4 lb

## 2023-11-29 DIAGNOSIS — G4719 Other hypersomnia: Secondary | ICD-10-CM

## 2023-11-29 DIAGNOSIS — R0683 Snoring: Secondary | ICD-10-CM

## 2023-11-29 DIAGNOSIS — I10 Essential (primary) hypertension: Secondary | ICD-10-CM

## 2023-11-29 NOTE — Patient Instructions (Signed)
PATIENT WILL NEED IN LAB SPLIT NIGHT SLEEP STUDY ASAP!!!  Avoid Allergens and Irritants Avoid secondhand smoke Avoid SICK contacts Recommend  Masking  when appropriate Recommend Keep up-to-date with vaccinations

## 2023-11-29 NOTE — Progress Notes (Signed)
Name: Morgan Moore MRN: [id number] DOB: 08-06-1956    CHIEF COMPLAINT:  EXCESSIVE DAYTIME SLEEPINESS ASSESSMENT  FOR SLEEP APNEA   HISTORY OF PRESENT ILLNESS: Patient has been having excessive daytime sleepiness for a long time Patient has been having extreme fatigue and tiredness, lack of energy + Nonrefreshing sleep  Discussed sleep data and reviewed with patient.  Encouraged proper weight management.  Discussed driving precautions and its relationship with hypersomnolence.  Discussed operating dangerous equipment and its relationship with hypersomnolence.  Discussed sleep hygiene, and benefits of a fixed sleep waked time.  The importance of getting eight or more hours of sleep discussed with patient.  Discussed limiting the use of the computer and television before bedtime.  Decrease naps during the day, so night time sleep will become enhanced.  Limit caffeine, and sleep deprivation.  HTN, stroke, and heart failure are potential risk factors.    EPWORTH SLEEP SCORE 4  Patient has been having very high blood pressure readings especially at night Noted to be in the 200's  CARDIOVASCULAR ASSESSMENT Carotid artery disease, unspecified laterality, unspecified type (HCC) Continue crestor and aspirin.  Saw Dr Gilda Crease 04/2022 - Given the patient's asymptomatic subcritical stenosis no further invasive testing or surgery recommended.  Duplex ultrasound shows 1-39% stenosis bilaterally (04/08/23). Continue antiplatelet therapy as prescribed Continue management of CAD, HTN and Hyperlipidemia.      Coronary artery disease of native artery of native heart with stable angina pectoris (HCC) Continues Continue risk factor modification.  Seeing cardiology.  3 MI's in the past has 5 stents  Patient is waking up in the middle of the night gasping for air sometimes and then has heart palpitations, BP reading is very high  then comes down after a few hrs.     No exacerbation at  this time No evidence of heart failure at this time No evidence or signs of infection at this time No respiratory distress No fevers, chills, nausea, vomiting, diarrhea No evidence of lower extremity edema No evidence hemoptysis   CBC    Component Value Date/Time   WBC 7.9 10/26/2022 0929   RBC 4.19 10/26/2022 0929   HGB 13.4 10/26/2022 0929   HGB 13.8 02/25/2015 0950   HCT 39.3 10/26/2022 0929   HCT 40.4 02/25/2015 0950   PLT 320.0 10/26/2022 0929   PLT 192 02/25/2015 0950   MCV 93.9 10/26/2022 0929   MCV 90 02/25/2015 0950   MCH 32.9 04/01/2021 2228   MCHC 34.0 10/26/2022 0929   RDW 14.0 10/26/2022 0929   RDW 14.0 02/25/2015 0950   LYMPHSABS 1.4 10/26/2022 0929   LYMPHSABS 1.3 02/25/2015 0950   MONOABS 0.8 10/26/2022 0929   MONOABS 1.5 (H) 02/25/2015 0950   EOSABS 0.3 10/26/2022 0929   EOSABS 0.1 02/25/2015 0950   BASOSABS 0.0 10/26/2022 0929   BASOSABS 0.0 02/25/2015 0950       Latest Ref Rng & Units 11/12/2023    9:03 AM 06/30/2023   10:20 AM 02/26/2023    9:39 AM  BMP  Glucose 70 - 99 mg/dL 94  562  97   BUN 6 - 23 mg/dL 20  17  16    Creatinine 0.40 - 1.20 mg/dL 1.30  8.65  7.84   Sodium 135 - 145 mEq/L 140  141  140   Potassium 3.5 - 5.1 mEq/L 3.7  3.9  3.9   Chloride 96 - 112 mEq/L 103  106  106   CO2 19 - 32 mEq/L 28  28  27   Calcium 8.4 - 10.5 mg/dL 9.3  9.2  9.2      PAST MEDICAL HISTORY :   has a past medical history of Allergy, Anemia, Anxiety, Chicken pox, Complication of anesthesia, Coronary artery disease, Crohn disease (HCC), DVT (deep venous thrombosis) (HCC) (2017), Dyspnea, Dysrhythmia (2019), GERD (gastroesophageal reflux disease), Heart disease, Heart murmur, Heart murmur, History of kidney stones, Hyperlipidemia, Hypertension, Inflammatory bowel disease, Migraines, Myocardial infarction (HCC), Phlebitis, and PONV (postoperative nausea and vomiting).  has a past surgical history that includes Tonsillectomy and adenoidectomy (1962); tubaligation  (1990); heart murmur; Tubal ligation (1993); Eye surgery (Bilateral); Coronary angioplasty with stent (2017); Breast cyst aspiration; Carpal tunnel release (Right, 12/02/2017); Ganglion cyst excision (Right, 12/02/2017); Hysteroscopy with D & C (N/A, 04/19/2020); and Breast biopsy (Left, 07/05/2019). Prior to Admission medications   Medication Sig Start Date End Date Taking? Authorizing Provider  aspirin EC 81 MG tablet Take 81 mg by mouth daily.    [provider]  Cholecalciferol 25 MCG (1000 UT) tablet Take 1,000 Units by mouth 2 (two) times daily.    [provider]  clopidogrel (PLAVIX) 75 MG tablet Take 75 mg by mouth daily.    [provider]  cyanocobalamin 1000 MCG tablet Take 1,000 mcg by mouth daily at 12 noon.     [provider]  diphenhydrAMINE (BENADRYL) 12.5 MG/5ML elixir Take 12.5 mg by mouth every 6 (six) hours as needed (allergic reaction).    [provider]  EPINEPHrine 0.3 mg/0.3 mL IJ SOAJ injection Inject 0.3 mg into the muscle once.  12/18/13   [provider]  escitalopram (LEXAPRO) 20 MG tablet TAKE ONE TABLET BY MOUTH ONCE DAILY 09/23/23   Dale Toomsuba, MD  Fe Fum-FePoly-Vit C-Vit B3 (INTEGRA) 62.5-62.5-40-3 MG CAPS TAKE ONE CAPSULE BY MOUTH three times WEEKLY. 10/05/22   Dale [place], MD  fluocinonide (LIDEX) 0.05 % external solution Apply 1 Application topically 2 (two) times daily. 03/26/23 03/25/24  [provider]  losartan (COZAAR) 25 MG tablet Take 25 mg by mouth daily.    [provider]  Melatonin-Pyridoxine 3-10 MG TABS Take 1 tablet by mouth at bedtime.    [provider]  mesalamine (LIALDA) 1.2 g EC tablet Take 1.2 g by mouth 3 (three) times daily with meals.     [provider]  mupirocin ointment (BACTROBAN) 2 % Apply to affected area bid 02/10/22   Dale [place], MD  nitroGLYCERIN (NITROSTAT) 0.4 MG SL tablet DISSOLVE (1) TABLET UNDER TONGUE AS NEEDED TO RELIEVE CHEST  PAIN. MAYREPEAT EVERY 5 MINUTES. 12/09/20   Dale G[place]on, MD  ondansetron (ZOFRAN-ODT) 4 MG disintegrating tablet Take 1 tablet (4 mg total) by mouth daily as needed for nausea. 10/09/21   Dale [place], MD  pantoprazole (PROTONIX) 20 MG tablet Take 1 tablet by mouth daily. 01/18/23   [provider]  prednisoLONE acetate (PRED FORTE) 1 % ophthalmic suspension Place 1 drop into both eyes 2 (two) times daily as needed (for inflammation).    [provider]  REPATHA 140 MG/ML SOSY Inject into the skin. 01/20/22   [provider]  rosuvastatin (CRESTOR) 5 MG tablet TAKE ONE TABLET BY MOUTH THREE TIMES A WEEK 09/15/23   Dale [place], MD  verapamil (CALAN) 40 MG tablet Take 40 mg by mouth every 6 (six) hours as needed.    [provider]  verapamil (VERELAN) 180 MG 24 hr capsule Take 180 mg by mouth daily. 11/12/23   [provider]  Allergies  Allergen Reactions   Ciprofloxacin Anaphylaxis   Keflex [Cephalexin] Anaphylaxis   Lisinopril Anaphylaxis   Meclizine Anaphylaxis   Monurol [Fosfomycin Tromethamine  (Obsolete)] Rash    Splotchy rash all over body. Breathing became difficult/tightened. Took benadryl immediately so she did not swell    Sulfa Antibiotics Anaphylaxis   Tessalon [Benzonatate] Anaphylaxis   Influenza Virus Vaccine Other (See Comments)    History of Epstein Barr   Codeine Nausea And Vomiting    FAMILY HISTORY:  family history includes Breast cancer in her maternal aunt; Cancer in her maternal aunt; Heart disease in her brother and father; Stroke in her mother. SOCIAL HISTORY:  reports that she has never smoked. She has never used smokeless tobacco. She reports that she does not drink alcohol and does not use drugs.   Review of Systems:  Gen:  Denies  fever, sweats, chills weight loss  HEENT: Denies blurred vision, double vision, ear pain, eye pain, hearing loss, nose bleeds, sore throat Cardiac:  No dizziness, chest pain or  heaviness, chest tightness,edema, No JVD Resp:   No cough, -sputum production, -shortness of breath,-wheezing, -hemoptysis,  Gi: Denies swallowing difficulty, stomach pain, nausea or vomiting, diarrhea, constipation, bowel incontinence Gu:  Denies bladder incontinence, burning urine Ext:   Denies Joint pain, stiffness or swelling Skin: Denies  skin rash, easy bruising or bleeding or hives Endoc:  Denies polyuria, polydipsia , polyphagia or weight change Psych:   Denies depression, insomnia or hallucinations  Other:  All other systems negative   ALL OTHER ROS ARE NEGATIVE  BP 128/70 (BP Location: Right Arm, Cuff Size: Normal)   Pulse 86   Temp 98.2 F (36.8 C)   Ht 5\' 1"  (1.549 m)   Wt 165 lb 6.4 oz (75 kg)   SpO2 98%   BMI 31.25 kg/m     Physical Examination:   General Appearance: No distress  EYES PERRLA, EOM intact.   NECK Supple, No JVD Pulmonary: normal breath sounds, No wheezing.  CardiovascularNormal S1,S2.  No m/r/g.   Abdomen: Benign, Soft, non-tender. Skin:   warm, no rashes, no ecchymosis  Extremities: normal, no cyanosis, clubbing. Neuro:without focal findings,  speech normal  PSYCHIATRIC: Mood, affect within normal limits.   ALL OTHER ROS ARE NEGATIVE    ASSESSMENT AND PLAN SYNOPSIS  Patient with signs and symptoms of excessive daytime sleepiness with probable underlying diagnosis of obstructive sleep apnea in the setting of  deconditioned state   Recommend In lab Split night Sleep Study for definitve diagnosis  Deconditioned state -Recommend increased daily activity and exercise   MEDICATION ADJUSTMENTS/LABS AND TESTS ORDERED: IN LAB SPLIT NIGHT SLEEP STUSY ASAP FOLLOW UP WITH CARDIOLOGY  CURRENT MEDICATIONS REVIEWED AT LENGTH WITH PATIENT TODAY   Patient  satisfied with Plan of action and management. All questions answered  Follow up  4 weeks Total Time Spent  65 mins   Lucie Leather, M.D.  [facility]  Medical Director [facility] Medical Director [facility] Cardio-Pulmonary Department

## 2023-11-30 ENCOUNTER — Telehealth (HOSPITAL_COMMUNITY): Payer: Self-pay | Admitting: *Deleted

## 2023-11-30 DIAGNOSIS — I739 Peripheral vascular disease, unspecified: Secondary | ICD-10-CM | POA: Diagnosis not present

## 2023-11-30 DIAGNOSIS — I10 Essential (primary) hypertension: Secondary | ICD-10-CM | POA: Diagnosis not present

## 2023-11-30 DIAGNOSIS — I471 Supraventricular tachycardia, unspecified: Secondary | ICD-10-CM | POA: Diagnosis not present

## 2023-11-30 DIAGNOSIS — I214 Non-ST elevation (NSTEMI) myocardial infarction: Secondary | ICD-10-CM | POA: Diagnosis not present

## 2023-11-30 DIAGNOSIS — Z955 Presence of coronary angioplasty implant and graft: Secondary | ICD-10-CM | POA: Diagnosis not present

## 2023-11-30 DIAGNOSIS — R002 Palpitations: Secondary | ICD-10-CM | POA: Diagnosis not present

## 2023-11-30 DIAGNOSIS — E782 Mixed hyperlipidemia: Secondary | ICD-10-CM | POA: Diagnosis not present

## 2023-11-30 DIAGNOSIS — K5010 Crohn's disease of large intestine without complications: Secondary | ICD-10-CM | POA: Diagnosis not present

## 2023-11-30 DIAGNOSIS — I2510 Atherosclerotic heart disease of native coronary artery without angina pectoris: Secondary | ICD-10-CM | POA: Diagnosis not present

## 2023-11-30 NOTE — Telephone Encounter (Signed)

## 2023-12-01 ENCOUNTER — Ambulatory Visit: Admission: RE | Admit: 2023-12-01 | Payer: Medicare PPO | Source: Ambulatory Visit

## 2023-12-01 NOTE — Telephone Encounter (Signed)
Patient has already been set up for STAT IN LAB sleep study

## 2023-12-06 DIAGNOSIS — I2510 Atherosclerotic heart disease of native coronary artery without angina pectoris: Secondary | ICD-10-CM | POA: Diagnosis not present

## 2023-12-06 DIAGNOSIS — E7841 Elevated Lipoprotein(a): Secondary | ICD-10-CM | POA: Diagnosis not present

## 2023-12-06 DIAGNOSIS — Z95820 Peripheral vascular angioplasty status with implants and grafts: Secondary | ICD-10-CM | POA: Diagnosis not present

## 2023-12-06 DIAGNOSIS — I10 Essential (primary) hypertension: Secondary | ICD-10-CM | POA: Diagnosis not present

## 2023-12-20 ENCOUNTER — Institutional Professional Consult (permissible substitution): Payer: Medicare PPO | Admitting: Internal Medicine

## 2023-12-29 ENCOUNTER — Telehealth: Payer: Self-pay | Admitting: Internal Medicine

## 2023-12-29 NOTE — Telephone Encounter (Signed)
 Error

## 2024-01-03 DIAGNOSIS — I10 Essential (primary) hypertension: Secondary | ICD-10-CM | POA: Diagnosis not present

## 2024-01-03 DIAGNOSIS — Z95820 Peripheral vascular angioplasty status with implants and grafts: Secondary | ICD-10-CM | POA: Diagnosis not present

## 2024-01-03 DIAGNOSIS — I2510 Atherosclerotic heart disease of native coronary artery without angina pectoris: Secondary | ICD-10-CM | POA: Diagnosis not present

## 2024-01-04 ENCOUNTER — Encounter: Payer: Self-pay | Admitting: Internal Medicine

## 2024-01-04 ENCOUNTER — Ambulatory Visit (INDEPENDENT_AMBULATORY_CARE_PROVIDER_SITE_OTHER): Payer: Medicare PPO | Admitting: Internal Medicine

## 2024-01-04 DIAGNOSIS — E7800 Pure hypercholesterolemia, unspecified: Secondary | ICD-10-CM

## 2024-01-04 NOTE — Progress Notes (Signed)
 Subjective:    Patient ID: Morgan Moore, female    DOB: 1956/05/29, 68 y.o.   MRN: [id number]  Patient here for No chief complaint on file.   HPI Was scheduled for a follow up appt. Did not show for appt.   History review: Has been evaluated recently after - she woke up with high BP, tingling in her extremities. Got up and tried to relax. Took fast-acting verapamil and four baby ASA, eventually went to [facility] ER. Ruled out for MI. Had a stress echo that was normal. Saw Dr. Juliann Pares after the ER visit and adjusted her meds. She had a Holter. She also has a cardiac MRI scheduled. Screening for pheo, hyperaldosteronism and renal artery stenosis is negative. Sleep study now rescheduled for March    Past Medical History:  Diagnosis Date   Allergy    Anemia    Anxiety    Chicken pox    Complication of anesthesia    vomitting   Coronary artery disease    Crohn disease (HCC)    DVT (deep venous thrombosis) (HCC) 2017   Dyspnea    on excertion   Dysrhythmia 2019   palpitations   GERD (gastroesophageal reflux disease)    Heart disease    Heart murmur    Heart murmur    History of kidney stones    Hyperlipidemia    Hypertension    Inflammatory bowel disease    Migraines    hormonal, puberty   Myocardial infarction (HCC)    Phlebitis    PONV (postoperative nausea and vomiting)    Past Surgical History:  Procedure Laterality Date   BREAST BIOPSY Left 07/05/2019   FIBROEPITHELIAL lesion/neg   BREAST CYST ASPIRATION     unsure of side   CARPAL TUNNEL RELEASE Right 12/02/2017   Procedure: CARPAL TUNNEL RELEASE ENDOSCOPIC;  Surgeon: Christena Flake, MD;  Location: [facility] ORS;  Service: Orthopedics;  Laterality: Right;   CORONARY ANGIOPLASTY WITH STENT PLACEMENT  2017   x 2   EYE SURGERY Bilateral    cataract surgery   GANGLION CYST EXCISION Right 12/02/2017   Procedure: REMOVAL GANGLION OF WRIST;  Surgeon: Christena Flake, MD;  Location: [facility] ORS;  Service: Orthopedics;   Laterality: Right;   heart murmur     HYSTEROSCOPY WITH D & C N/A 04/19/2020   Procedure: DILATATION AND CURETTAGE /HYSTEROSCOPY, POSSIBLE  POLYPECTOMY;  Surgeon: Christeen Douglas, MD;  Location: [facility] ORS;  Service: Gynecology;  Laterality: N/A;   TONSILLECTOMY AND ADENOIDECTOMY  1962   TUBAL LIGATION  1993   tubaligation  1990   Family History  Problem Relation Age of Onset   Heart disease Father    Heart disease Brother    Cancer Maternal Aunt    Breast cancer Maternal Aunt    Stroke Mother    Kidney disease Neg Hx    GU problems Neg Hx    Kidney cancer Neg Hx    Social History   Socioeconomic History   Marital status: Single    Spouse name: Not on file   Number of children: Not on file   Years of education: Not on file   Highest education level: Not on file  Occupational History   Not on file  Tobacco Use   Smoking status: Never   Smokeless tobacco: Never  Vaping Use   Vaping status: Never Used  Substance and Sexual Activity   Alcohol use: No    Alcohol/week: 0.0 standard drinks of alcohol  Drug use: No   Sexual activity: Not on file  Other Topics Concern   Not on file  Social History Narrative   Had 2 kids. Daughter died in 05-Feb-2012 son living    Former Writer    Social Drivers of Health   Financial Resource Strain: Low Risk  (04/19/2023)   Overall Financial Resource Strain (CARDIA)    Difficulty of Paying Living Expenses: Not hard at all  Food Insecurity: No Food Insecurity (04/19/2023)   Hunger Vital Sign    Worried About Running Out of Food in the Last Year: Never true    Ran Out of Food in the Last Year: Never true  Transportation Needs: No Transportation Needs (04/19/2023)   PRAPARE - Administrator, Civil Service (Medical): No    Lack of Transportation (Non-Medical): No  Physical Activity: Sufficiently Active (04/19/2023)   Exercise Vital Sign    Days of Exercise per Week: 5 days    Minutes of Exercise per Session: 30 min  Recent  Concern: Physical Activity - Insufficiently Active (04/19/2023)   Exercise Vital Sign    Days of Exercise per Week: 3 days    Minutes of Exercise per Session: 30 min  Stress: No Stress Concern Present (04/19/2023)   [employer] of Occupational Health - Occupational Stress Questionnaire    Feeling of Stress : Not at all  Social Connections: Unknown (04/19/2023)   Social Connection and Isolation Panel [NHANES]    Frequency of Communication with Friends and Family: More than three times a week    Frequency of Social Gatherings with Friends and Family: Twice a week    Attends Religious Services: More than 4 times per year    Active Member of [employer] or Organizations: No    Attends Banker Meetings: Never    Marital Status: Not on file     Review of Systems     Objective:     There were no vitals taken for this visit. Wt Readings from Last 3 Encounters:  11/29/23 165 lb 6.4 oz (75 kg)  11/16/23 163 lb 3.2 oz (74 kg)  07/08/23 165 lb 3.2 oz (74.9 kg)    Physical Exam      Outpatient Encounter Medications as of 01/04/2024  Medication Sig   aspirin EC 81 MG tablet Take 81 mg by mouth daily.   Cholecalciferol 25 MCG (1000 UT) tablet Take 1,000 Units by mouth 2 (two) times daily.   clopidogrel (PLAVIX) 75 MG tablet Take 75 mg by mouth daily.   cyanocobalamin 1000 MCG tablet Take 1,000 mcg by mouth daily at 12 noon.    diphenhydrAMINE (BENADRYL) 12.5 MG/5ML elixir Take 12.5 mg by mouth every 6 (six) hours as needed (allergic reaction).   EPINEPHrine 0.3 mg/0.3 mL IJ SOAJ injection Inject 0.3 mg into the muscle once.    escitalopram (LEXAPRO) 20 MG tablet TAKE ONE TABLET BY MOUTH ONCE DAILY   Fe Fum-FePoly-Vit C-Vit B3 (INTEGRA) 62.5-62.5-40-3 MG CAPS TAKE ONE CAPSULE BY MOUTH three times WEEKLY.   fluocinonide (LIDEX) 0.05 % external solution Apply 1 Application topically 2 (two) times daily.   losartan (COZAAR) 25 MG tablet Take 25 mg by mouth daily.    Melatonin-Pyridoxine 3-10 MG TABS Take 1 tablet by mouth at bedtime.   mesalamine (LIALDA) 1.2 g EC tablet Take 1.2 g by mouth 3 (three) times daily with meals.    mupirocin ointment (BACTROBAN) 2 % Apply to affected area bid   nitroGLYCERIN (NITROSTAT) 0.4  MG SL tablet DISSOLVE (1) TABLET UNDER TONGUE AS NEEDED TO RELIEVE CHEST PAIN. MAYREPEAT EVERY 5 MINUTES.   ondansetron (ZOFRAN-ODT) 4 MG disintegrating tablet Take 1 tablet (4 mg total) by mouth daily as needed for nausea.   pantoprazole (PROTONIX) 20 MG tablet Take 1 tablet by mouth daily.   prednisoLONE acetate (PRED FORTE) 1 % ophthalmic suspension Place 1 drop into both eyes 2 (two) times daily as needed (for inflammation).   REPATHA 140 MG/ML SOSY Inject into the skin.   rosuvastatin (CRESTOR) 5 MG tablet TAKE ONE TABLET BY MOUTH THREE TIMES A WEEK   verapamil (CALAN) 40 MG tablet Take 40 mg by mouth every 6 (six) hours as needed.   verapamil (VERELAN) 180 MG 24 hr capsule Take 180 mg by mouth daily.   No facility-administered encounter medications on file as of 01/04/2024.     Lab Results  Component Value Date   WBC 7.9 10/26/2022   HGB 13.4 10/26/2022   HCT 39.3 10/26/2022   PLT 320.0 10/26/2022   GLUCOSE 94 11/12/2023   CHOL 148 11/12/2023   TRIG 121.0 11/12/2023   HDL 57.20 11/12/2023   LDLCALC 67 11/12/2023   ALT 20 11/12/2023   AST 17 11/12/2023   NA 140 11/12/2023   K 3.7 11/12/2023   CL 103 11/12/2023   CREATININE 0.83 11/12/2023   BUN 20 11/12/2023   CO2 28 11/12/2023   TSH 1.91 06/30/2023   INR 1.0 12/23/2017    MM 3D DIAGNOSTIC MAMMOGRAM UNILATERAL RIGHT BREAST Result Date: 08/17/2023 CLINICAL DATA:  Screening recall for right breast asymmetries. EXAM: DIGITAL DIAGNOSTIC UNILATERAL RIGHT MAMMOGRAM WITH TOMOSYNTHESIS AND CAD; ULTRASOUND RIGHT BREAST LIMITED TECHNIQUE: Right digital diagnostic mammography and breast tomosynthesis was performed. The images were evaluated with computer-aided detection. ; Targeted  ultrasound examination of the right breast was performed COMPARISON:  Previous exam(s). ACR Breast Density Category c: The breasts are heterogeneously dense, which may obscure small masses. FINDINGS: Spot compression tomosynthesis images through the superior right breast demonstrates an obscured oval mass measuring approximately 1.5 cm. Images through the upper outer quadrant of the right breast demonstrate no definite additional masses, however the tissue is heterogeneously dense in this region, and therefore ultrasound will be performed for further evaluation. Ultrasound targeted to the right breast at oval 1 o'clock, 4 cm from the nipple demonstrates an anechoic oval circumscribed mass measuring 1.2 x 0.5 x 1.2 cm. The patient states that she has tenderness at the site of this cyst. In the upper-outer quadrant of the right breast there are a few subcentimeter anechoic cysts measuring 3 mm. IMPRESSION: Benign fibrocystic changes in the right breast. RECOMMENDATION: Screening mammogram in one year.(Code:SM-B-01Y) I have discussed the findings and recommendations with the patient. If applicable, a reminder letter will be sent to the patient regarding the next appointment. BI-RADS CATEGORY  2: Benign. Electronically Signed   By: Frederico Hamman M.D.   On: 08/17/2023 14:17   [place] LIMITED ULTRASOUND INCLUDING AXILLA RIGHT BREAST Result Date: 08/17/2023 CLINICAL DATA:  Screening recall for right breast asymmetries. EXAM: DIGITAL DIAGNOSTIC UNILATERAL RIGHT MAMMOGRAM WITH TOMOSYNTHESIS AND CAD; ULTRASOUND RIGHT BREAST LIMITED TECHNIQUE: Right digital diagnostic mammography and breast tomosynthesis was performed. The images were evaluated with computer-aided detection. ; Targeted ultrasound examination of the right breast was performed COMPARISON:  Previous exam(s). ACR Breast Density Category c: The breasts are heterogeneously dense, which may obscure small masses. FINDINGS: Spot compression tomosynthesis images  through the superior right breast demonstrates an obscured oval mass  measuring approximately 1.5 cm. Images through the upper outer quadrant of the right breast demonstrate no definite additional masses, however the tissue is heterogeneously dense in this region, and therefore ultrasound will be performed for further evaluation. Ultrasound targeted to the right breast at oval 1 o'clock, 4 cm from the nipple demonstrates an anechoic oval circumscribed mass measuring 1.2 x 0.5 x 1.2 cm. The patient states that she has tenderness at the site of this cyst. In the upper-outer quadrant of the right breast there are a few subcentimeter anechoic cysts measuring 3 mm. IMPRESSION: Benign fibrocystic changes in the right breast. RECOMMENDATION: Screening mammogram in one year.(Code:SM-B-01Y) I have discussed the findings and recommendations with the patient. If applicable, a reminder letter will be sent to the patient regarding the next appointment. BI-RADS CATEGORY  2: Benign. Electronically Signed   By: Frederico Hamman M.D.   On: 08/17/2023 14:17       Assessment & Plan:  Hypercholesterolemia -     Basic metabolic panel; Future -     Hepatic function panel; Future -     Lipid panel; Future     Dale [place], MD

## 2024-01-17 ENCOUNTER — Ambulatory Visit: Attending: Otolaryngology

## 2024-01-17 DIAGNOSIS — R0683 Snoring: Secondary | ICD-10-CM | POA: Diagnosis present

## 2024-01-17 DIAGNOSIS — G4761 Periodic limb movement disorder: Secondary | ICD-10-CM | POA: Insufficient documentation

## 2024-01-17 DIAGNOSIS — G4733 Obstructive sleep apnea (adult) (pediatric): Secondary | ICD-10-CM | POA: Insufficient documentation

## 2024-01-17 DIAGNOSIS — R942 Abnormal results of pulmonary function studies: Secondary | ICD-10-CM | POA: Insufficient documentation

## 2024-01-18 DIAGNOSIS — H43811 Vitreous degeneration, right eye: Secondary | ICD-10-CM | POA: Diagnosis not present

## 2024-01-18 DIAGNOSIS — Z961 Presence of intraocular lens: Secondary | ICD-10-CM | POA: Diagnosis not present

## 2024-01-18 DIAGNOSIS — D3131 Benign neoplasm of right choroid: Secondary | ICD-10-CM | POA: Diagnosis not present

## 2024-01-19 ENCOUNTER — Other Ambulatory Visit: Payer: Self-pay | Admitting: Internal Medicine

## 2024-01-19 ENCOUNTER — Ambulatory Visit
Admission: RE | Admit: 2024-01-19 | Discharge: 2024-01-19 | Disposition: A | Discharge status: Home / Self Care | Source: Ambulatory Visit | Attending: Internal Medicine | Admitting: Internal Medicine

## 2024-01-19 DIAGNOSIS — I2510 Atherosclerotic heart disease of native coronary artery without angina pectoris: Secondary | ICD-10-CM

## 2024-01-19 MED ORDER — GADOBUTROL 1 MMOL/ML IV SOLN
10.0000 mL | Freq: Once | INTRAVENOUS | Status: AC | PRN
  Administered 2024-01-19: 10 mL via INTRAVENOUS

## 2024-01-28 ENCOUNTER — Telehealth: Payer: Self-pay

## 2024-01-28 NOTE — Telephone Encounter (Signed)
 Copied from CRM [id number]. Topic: Clinical - Lab/Test Results >> Jan 28, 2024  1:24 PM Gurney Maxin H wrote: Reason for CRM: Patient calling in to check and see how she will receive her results from her sleep study done on 3/10, patient not sure if Dr. Roby Lofts office will reach out or the facility where she had sleep study done will reach out with results, please reach out to patient, thanks.  Zelpha [phone]

## 2024-01-28 NOTE — Telephone Encounter (Signed)
 LM for patient. She should contact [facility] for her sleep study results.

## 2024-01-31 ENCOUNTER — Telehealth: Payer: Self-pay | Admitting: Internal Medicine

## 2024-01-31 NOTE — Telephone Encounter (Signed)
 I called [facility] and the sleep study hasn't been read yet

## 2024-01-31 NOTE — Telephone Encounter (Signed)
 Patient would like results of sleep study. Patient scheduled with Dr. Belia Heman on 02/29/2024. Patient phone number is [phone].

## 2024-01-31 NOTE — Telephone Encounter (Signed)
 I spoke with the patient. She did the in lab sleep study on 01/17/2024.  Synetta Fail- can you try and get her results? Thank you!

## 2024-02-04 DIAGNOSIS — G4733 Obstructive sleep apnea (adult) (pediatric): Secondary | ICD-10-CM

## 2024-02-07 ENCOUNTER — Telehealth (INDEPENDENT_AMBULATORY_CARE_PROVIDER_SITE_OTHER): Payer: Self-pay | Admitting: Sleep Medicine

## 2024-02-07 NOTE — Telephone Encounter (Signed)
 Patient advise. Patient requesting to go over report in office. Appt scheduled for 02/29/2024. Nothing further needed.

## 2024-02-07 NOTE — Telephone Encounter (Signed)
 PSG scanned in chart, revealed moderate OSA (AHI 24, O2 nadir 77%).

## 2024-02-08 ENCOUNTER — Encounter (INDEPENDENT_AMBULATORY_CARE_PROVIDER_SITE_OTHER): Payer: Self-pay | Admitting: Vascular Surgery

## 2024-02-08 NOTE — Telephone Encounter (Signed)
 Sleep Study has been scanned into the chart

## 2024-02-08 NOTE — Progress Notes (Signed)
 MRN : [id number]  Morgan Moore is a 68 y.o. (Aug 29, 1956) female who presents with chief complaint of check carotid arteries.  History of Present Illness:   Duplex ultrasound of the carotid arteries obtained today demonstrates 1 to 39% stenosis bilateral internal carotid arteries  Current Meds  Medication Sig   aspirin EC 81 MG tablet Take 81 mg by mouth daily.   Cholecalciferol 25 MCG (1000 UT) tablet Take 1,000 Units by mouth 2 (two) times daily.   clopidogrel (PLAVIX) 75 MG tablet Take 75 mg by mouth daily.   cyanocobalamin 1000 MCG tablet Take 1,000 mcg by mouth daily at 12 noon.    diphenhydrAMINE (BENADRYL) 12.5 MG/5ML elixir Take 12.5 mg by mouth every 6 (six) hours as needed (allergic reaction).   EPINEPHrine 0.3 mg/0.3 mL IJ SOAJ injection Inject 0.3 mg into the muscle once.    Fe Fum-FePoly-Vit C-Vit B3 (INTEGRA) 62.5-62.5-40-3 MG CAPS TAKE ONE CAPSULE BY MOUTH three times WEEKLY.   fluocinonide (LIDEX) 0.05 % external solution Apply 1 Application topically 2 (two) times daily.   Melatonin-Pyridoxine 3-10 MG TABS Take 1 tablet by mouth at bedtime.   mesalamine (LIALDA) 1.2 g EC tablet Take 1.2 g by mouth 3 (three) times daily with meals.    mupirocin ointment (BACTROBAN) 2 % Apply to affected area bid   nitroGLYCERIN (NITROSTAT) 0.4 MG SL tablet DISSOLVE (1) TABLET UNDER TONGUE AS NEEDED TO RELIEVE CHEST PAIN. MAYREPEAT EVERY 5 MINUTES.   ondansetron (ZOFRAN-ODT) 4 MG disintegrating tablet Take 1 tablet (4 mg total) by mouth daily as needed for nausea.   pantoprazole (PROTONIX) 20 MG tablet Take 1 tablet by mouth daily.   prednisoLONE acetate (PRED FORTE) 1 % ophthalmic suspension Place 1 drop into both eyes 2 (two) times daily as needed (for inflammation).   REPATHA 140 MG/ML SOSY Inject into the skin.   [DISCONTINUED] erythromycin ophthalmic ointment 1 Application. (Patient not taking: Reported on 04/19/2023)   [DISCONTINUED] escitalopram  (LEXAPRO) 20 MG tablet Take 1 tablet (20 mg total) by mouth daily.   [DISCONTINUED] rosuvastatin (CRESTOR) 5 MG tablet Take 1 tablet 3x/week   [DISCONTINUED] verapamil (CALAN-SR) 120 MG CR tablet Take 120 mg by mouth every evening.     Past Medical History:  Diagnosis Date   Allergy    Anemia    Anxiety    Chicken pox    Complication of anesthesia    vomitting   Coronary artery disease    Crohn disease (HCC)    DVT (deep venous thrombosis) (HCC) 2017   Dyspnea    on excertion   Dysrhythmia 2019   palpitations   GERD (gastroesophageal reflux disease)    Heart disease    Heart murmur    Heart murmur    History of kidney stones    Hyperlipidemia    Hypertension    Inflammatory bowel disease    Migraines    hormonal, puberty   Myocardial infarction (HCC)    Phlebitis    PONV (postoperative nausea and vomiting)     Past Surgical History:  Procedure Laterality Date   BREAST BIOPSY Left 07/05/2019   FIBROEPITHELIAL lesion/neg   BREAST CYST ASPIRATION     unsure of side   CARPAL TUNNEL RELEASE Right 12/02/2017   Procedure: CARPAL TUNNEL RELEASE ENDOSCOPIC;  Surgeon: Christena Flake, MD;  Location: [facility];  Service:  Orthopedics;  Laterality: Right;   CORONARY ANGIOPLASTY WITH STENT PLACEMENT  2017   x 2   EYE SURGERY Bilateral    cataract surgery   GANGLION CYST EXCISION Right 12/02/2017   Procedure: REMOVAL GANGLION OF WRIST;  Surgeon: Christena Flake, MD;  Location: [facility];  Service: Orthopedics;  Laterality: Right;   heart murmur     HYSTEROSCOPY WITH D & C N/A 04/19/2020   Procedure: DILATATION AND CURETTAGE /HYSTEROSCOPY, POSSIBLE  POLYPECTOMY;  Surgeon: Christeen Douglas, MD;  Location: [facility];  Service: Gynecology;  Laterality: N/A;   TONSILLECTOMY AND ADENOIDECTOMY  1962   TUBAL LIGATION  1993   tubaligation  1990    Social History Social History   Tobacco Use   Smoking status: Never   Smokeless tobacco: Never  Vaping Use   Vaping status: Never Used   Substance Use Topics   Alcohol use: No    Alcohol/week: 0.0 standard drinks of alcohol   Drug use: No    Family History Family History  Problem Relation Age of Onset   Heart disease Father    Heart disease Brother    Cancer Maternal Aunt    Breast cancer Maternal Aunt    Stroke Mother    Kidney disease Neg Hx    GU problems Neg Hx    Kidney cancer Neg Hx     Allergies  Allergen Reactions   Ciprofloxacin Anaphylaxis   Keflex [Cephalexin] Anaphylaxis   Lisinopril Anaphylaxis   Meclizine Anaphylaxis   Monurol [Fosfomycin Tromethamine  (Obsolete)] Rash    Splotchy rash all over body. Breathing became difficult/tightened. Took benadryl immediately so she did not swell    Sulfa Antibiotics Anaphylaxis   Tessalon [Benzonatate] Anaphylaxis   Influenza Virus Vaccine Other (See Comments)    History of Epstein Barr   Codeine Nausea And Vomiting     REVIEW OF SYSTEMS (Negative unless checked)  Constitutional: [] Weight loss  [] Fever  [] Chills Cardiac: [] Chest pain   [] Chest pressure   [] Palpitations   [] Shortness of breath when laying flat   [] Shortness of breath with exertion. Vascular:  [x] Pain in legs with walking   [] Pain in legs at rest  [] History of DVT   [] Phlebitis   [] Swelling in legs   [] Varicose veins   [] Non-healing ulcers Pulmonary:   [] Uses home oxygen   [] Productive cough   [] Hemoptysis   [] Wheeze  [] COPD   [] Asthma Neurologic:  [] Dizziness   [] Seizures   [] History of stroke   [] History of TIA  [] Aphasia   [] Vissual changes   [] Weakness or numbness in arm   [] Weakness or numbness in leg Musculoskeletal:   [] Joint swelling   [] Joint pain   [] Low back pain Hematologic:  [] Easy bruising  [] Easy bleeding   [] Hypercoagulable state   [] Anemic Gastrointestinal:  [] Diarrhea   [] Vomiting  [] Gastroesophageal reflux/heartburn   [] Difficulty swallowing. Genitourinary:  [] Chronic kidney disease   [] Difficult urination  [] Frequent urination   [] Blood in urine Skin:  [] Rashes    [] Ulcers  Psychological:  [] History of anxiety   []  History of major depression.  Physical Examination  Vitals:   04/08/23 1405  BP: 122/80  Pulse: 64  Resp: 18  Weight: 164 lb (74.4 kg)  Height: 5\' 2"  (1.575 m)   Body mass index is 30 kg/m.  CBC Lab Results  Component Value Date   WBC 7.9 10/26/2022   HGB 13.4 10/26/2022   HCT 39.3 10/26/2022   MCV 93.9 10/26/2022   PLT 320.0 10/26/2022  BMET    Component Value Date/Time   NA 140 11/12/2023 0903   NA 135 02/25/2015 0950   K 3.7 11/12/2023 0903   K 3.8 02/25/2015 0950   CL 103 11/12/2023 0903   CL 97 (L) 02/25/2015 0950   CO2 28 11/12/2023 0903   CO2 29 02/25/2015 0950   GLUCOSE 94 11/12/2023 0903   GLUCOSE 100 (H) 02/25/2015 0950   BUN 20 11/12/2023 0903   BUN 12 02/25/2015 0950   CREATININE 0.83 11/12/2023 0903   CREATININE 0.83 02/25/2015 0950   CALCIUM 9.3 11/12/2023 0903   CALCIUM 8.9 02/25/2015 0950   GFRNONAA >60 04/01/2021 2228   GFRNONAA >60 02/25/2015 0950   GFRAA >60 11/19/2017 1217   GFRAA >60 02/25/2015 0950   CrCl cannot be calculated (Patient's most recent lab result is older than the maximum 21 days allowed.).  COAG Lab Results  Component Value Date   INR 1.0 12/23/2017   INR 1.1 09/21/2014    Radiology MR CARDIAC MORPHOLOGY W WO CONTRAST Result Date: 01/20/2024 CLINICAL DATA:  SVT, palpitations, NSTEMI EXAM: CARDIAC MRI TECHNIQUE: The patient was scanned on a 1.5 Tesla Siemens magnet. A dedicated cardiac coil was used. Functional imaging was done using Fiesta sequences. 2,3, and 4 chamber views were done to assess for RWMA's. Modified Simpson's rule using a short axis stack was used to calculate an ejection fraction on a dedicated work [employer]. The patient received 10 cc of Gadavist. After 10 minutes inversion recovery sequences were used to assess for infiltration and scar tissue. Velocity flow mapping performed in the ascending aorta and main pulmonary artery.  CONTRAST:  10 cc  of Gadavist FINDINGS: 1. Normal left ventricular size, thickness and systolic function (LVEF = 65%). There are no regional wall motion abnormalities. There is no late gadolinium enhancement in the left ventricular myocardium. LVEDV: 107 ml LVESV: 37 ml SV: 70 ml CO: 3.8 L/min Myocardial mass: 83g 2. Normal right ventricular size, thickness and systolic function (RVEF = 54%). There are no regional wall motion abnormalities. 3.  Normal left and right atrial size. 4. Normal size of the aortic root, ascending aorta and pulmonary artery. 5.  No significant valvular abnormalities. 6.  Normal pericardium.  No pericardial effusion. IMPRESSION: 1.  Normal LV systolic function.  LVEF 65%. 2.  No LGE noted. 3.  No evidence for infiltrative disease. 4.  Normal RV function. 5.  No significant valvular abnormalities. Electronically Signed   By: Debbe Odea M.D.   On: 01/20/2024 13:09   MR CARDIAC VELOCITY FLOW MAP Result Date: 01/20/2024 CLINICAL DATA:  SVT, palpitations, NSTEMI EXAM: CARDIAC MRI TECHNIQUE: The patient was scanned on a 1.5 Tesla Siemens magnet. A dedicated cardiac coil was used. Functional imaging was done using Fiesta sequences. 2,3, and 4 chamber views were done to assess for RWMA's. Modified Simpson's rule using a short axis stack was used to calculate an ejection fraction on a dedicated work [employer], [employer]. The patient received 10 cc of Gadavist. After 10 minutes inversion recovery sequences were used to assess for infiltration and scar tissue. Velocity flow mapping performed in the ascending aorta and main pulmonary artery. CONTRAST:  10 cc  of Gadavist FINDINGS: 1. Normal left ventricular size, thickness and systolic function (LVEF = 65%). There are no regional wall motion abnormalities. There is no late gadolinium enhancement in the left ventricular myocardium. LVEDV: 107 ml LVESV: 37 ml SV: 70 ml CO: 3.8 L/min Myocardial mass: 83g 2. Normal  right ventricular  size, thickness and systolic function (RVEF = 54%). There are no regional wall motion abnormalities. 3.  Normal left and right atrial size. 4. Normal size of the aortic root, ascending aorta and pulmonary artery. 5.  No significant valvular abnormalities. 6.  Normal pericardium.  No pericardial effusion. IMPRESSION: 1.  Normal LV systolic function.  LVEF 65%. 2.  No LGE noted. 3.  No evidence for infiltrative disease. 4.  Normal RV function. 5.  No significant valvular abnormalities. Electronically Signed   By: Debbe Odea M.D.   On: 01/20/2024 13:09   MR CARDIAC VELOCITY FLOW MAP Result Date: 01/20/2024 CLINICAL DATA:  SVT, palpitations, NSTEMI EXAM: CARDIAC MRI TECHNIQUE: The patient was scanned on a 1.5 Tesla Siemens magnet. A dedicated cardiac coil was used. Functional imaging was done using Fiesta sequences. 2,3, and 4 chamber views were done to assess for RWMA's. Modified Simpson's rule using a short axis stack was used to calculate an ejection fraction on a dedicated work [employer], [employer]. The patient received 10 cc of Gadavist. After 10 minutes inversion recovery sequences were used to assess for infiltration and scar tissue. Velocity flow mapping performed in the ascending aorta and main pulmonary artery. CONTRAST:  10 cc  of Gadavist FINDINGS: 1. Normal left ventricular size, thickness and systolic function (LVEF = 65%). There are no regional wall motion abnormalities. There is no late gadolinium enhancement in the left ventricular myocardium. LVEDV: 107 ml LVESV: 37 ml SV: 70 ml CO: 3.8 L/min Myocardial mass: 83g 2. Normal right ventricular size, thickness and systolic function (RVEF = 54%). There are no regional wall motion abnormalities. 3.  Normal left and right atrial size. 4. Normal size of the aortic root, ascending aorta and pulmonary artery. 5.  No significant valvular abnormalities. 6.  Normal pericardium.  No pericardial effusion. IMPRESSION: 1.  Normal LV systolic  function.  LVEF 65%. 2.  No LGE noted. 3.  No evidence for infiltrative disease. 4.  Normal RV function. 5.  No significant valvular abnormalities. Electronically Signed   By: Debbe Odea M.D.   On: 01/20/2024 13:09     Assessment/Plan 1. Carotid artery disease, unspecified laterality, unspecified type (HCC) (Primary) Recommend:  Given the patient's asymptomatic subcritical stenosis no further invasive testing or surgery at this time.  Duplex ultrasound shows 1-39% stenosis bilaterally.  Continue antiplatelet therapy as prescribed Continue management of CAD, HTN and Hyperlipidemia Healthy heart diet,  encouraged exercise at least 4 times per week  Follow up in 12 months with duplex ultrasound and physical exam  - VAS US CAROTID; Future    Levora Dredge, MD  02/08/2024 11:21 AM

## 2024-02-09 ENCOUNTER — Other Ambulatory Visit: Payer: Medicare PPO

## 2024-02-16 ENCOUNTER — Telehealth: Payer: Self-pay

## 2024-02-16 DIAGNOSIS — I10 Essential (primary) hypertension: Secondary | ICD-10-CM

## 2024-02-16 DIAGNOSIS — M2550 Pain in unspecified joint: Secondary | ICD-10-CM

## 2024-02-16 DIAGNOSIS — E7800 Pure hypercholesterolemia, unspecified: Secondary | ICD-10-CM

## 2024-02-16 DIAGNOSIS — E559 Vitamin D deficiency, unspecified: Secondary | ICD-10-CM

## 2024-02-16 NOTE — Telephone Encounter (Signed)
 Copied from CRM [id number]. Topic: Clinical - Medication Question >> Feb 16, 2024 11:51 AM Fredrich Romans wrote: Reason for CRM: Patient would like to know if she is suppose to have labs before her appointment on 03/07/2024 with Dr Lorin Picket?If so she is wanting to have an extra lab added to check her bones,she stated that they hurt real bad.

## 2024-02-17 NOTE — Telephone Encounter (Signed)
 I guess the patient is still waiting for sleep study results

## 2024-02-17 NOTE — Telephone Encounter (Signed)
 She does need to be schedule for fasting labs prior to her appt.  Need to know specifically what symptoms she is having (this will help determine what further lab testing needed) and need to confirm if she needs to be seen earlier.

## 2024-02-17 NOTE — Telephone Encounter (Signed)
 Per Telephone encounter from 3/31. The patient is aware of her sleep study results.  Nothing further needed.

## 2024-02-17 NOTE — Telephone Encounter (Addendum)
 She is still have joint pain that she discussed in the past (hips, knees, elbows, & shoulders mainly). Pain has increased some. She is still on the Repatha & just wanted to see if there are any additional labs shcan have checked,  Fasting lab appt has been scheduled on 4/25 @ 10:15. Pt does not feel that she needs to be seen sooner. She just wanted to make sure that we drew any necessary labs prior to her appt if possible.  Fasting labs ordered

## 2024-02-18 NOTE — Addendum Note (Signed)
 Addended by: Charm Barges on: 02/18/2024 04:50 AM   Modules accepted: Orders

## 2024-02-18 NOTE — Telephone Encounter (Signed)
 Labs ordered.

## 2024-02-21 DIAGNOSIS — I2510 Atherosclerotic heart disease of native coronary artery without angina pectoris: Secondary | ICD-10-CM | POA: Diagnosis not present

## 2024-02-21 DIAGNOSIS — T82855D Stenosis of coronary artery stent, subsequent encounter: Secondary | ICD-10-CM | POA: Diagnosis not present

## 2024-02-21 DIAGNOSIS — Z95820 Peripheral vascular angioplasty status with implants and grafts: Secondary | ICD-10-CM | POA: Diagnosis not present

## 2024-02-21 DIAGNOSIS — I471 Supraventricular tachycardia, unspecified: Secondary | ICD-10-CM | POA: Diagnosis not present

## 2024-02-21 DIAGNOSIS — I10 Essential (primary) hypertension: Secondary | ICD-10-CM | POA: Diagnosis not present

## 2024-02-29 ENCOUNTER — Encounter: Payer: Self-pay | Admitting: Internal Medicine

## 2024-02-29 ENCOUNTER — Ambulatory Visit: Admitting: Internal Medicine

## 2024-02-29 VITALS — BP 118/78 | HR 65 | Temp 98.5°F | Ht 61.0 in | Wt 168.2 lb

## 2024-02-29 DIAGNOSIS — G4730 Sleep apnea, unspecified: Secondary | ICD-10-CM

## 2024-02-29 DIAGNOSIS — G4733 Obstructive sleep apnea (adult) (pediatric): Secondary | ICD-10-CM

## 2024-02-29 HISTORY — DX: Sleep apnea, unspecified: G47.30

## 2024-02-29 NOTE — Patient Instructions (Addendum)
 Referral to DME company [employer] RESMED AIR-TOUCH FIT N30i MASK START  CPAP 11 cm h20  Please monitor Blood pressure when you start CPAP therapy  Avoid Allergens and Irritants Avoid secondhand smoke Avoid SICK contacts Recommend  Masking  when appropriate Recommend Keep up-to-date with vaccinations

## 2024-02-29 NOTE — Progress Notes (Addendum)
 Name: Morgan Moore MRN: [id number] DOB: April 11, 1956      SPLIT NIGHT SLEEP STUDY Mod/Severe sleep apnea AHI 24 REM AHI 80 Supine AHI 33 O2 saturations less than 88% 58 minutes CPAP titration study to 11 cm water pressure controlled OSA   CHIEF COMPLAINT:  Follow-up assessment for OSA   HISTORY OF PRESENT ILLNESS:  Discussed sleep data and reviewed with patient.  Split-night sleep study was reviewed in detail with patient CPAP 11 was best therapy for OSA  Discussed sleep hygiene, and benefits of a fixed sleep waked time.  The importance of getting eight or more hours of sleep discussed with patient.  Discussed limiting the use of the computer and television before bedtime.  Decrease naps during the day, so night time sleep will become enhanced.  Limit caffeine, and sleep deprivation.  HTN, stroke, and heart failure are potential risk factors.  Patient has been having very high blood pressure readings especially at night, Noted to be in the 200's  CARDIOVASCULAR ASSESSMENT Carotid artery disease, unspecified laterality, unspecified type (HCC) Continue crestor  and aspirin .  Saw Dr Prescilla Brod 04/2022 - Given the patient's asymptomatic subcritical stenosis no further invasive testing or surgery recommended.  Duplex ultrasound shows 1-39% stenosis bilaterally (04/08/23). Continue antiplatelet therapy as prescribed Continue management of CAD, HTN and Hyperlipidemia.   Coronary artery disease of native artery of native heart with stable angina pectoris (HCC) Continues Continue risk factor modification.  Seeing cardiology.  3 MI's in the past has 5 stents  No exacerbation at this time No evidence of heart failure at this time No evidence or signs of infection at this time No respiratory distress No fevers, chills, nausea, vomiting, diarrhea No evidence of lower extremity edema No evidence hemoptysis   CBC    Component Value Date/Time   WBC 7.9 10/26/2022 0929   RBC  4.19 10/26/2022 0929   HGB 13.4 10/26/2022 0929   HGB 13.8 02/25/2015 0950   HCT 39.3 10/26/2022 0929   HCT 40.4 02/25/2015 0950   PLT 320.0 10/26/2022 0929   PLT 192 02/25/2015 0950   MCV 93.9 10/26/2022 0929   MCV 90 02/25/2015 0950   MCH 32.9 04/01/2021 2228   MCHC 34.0 10/26/2022 0929   RDW 14.0 10/26/2022 0929   RDW 14.0 02/25/2015 0950   LYMPHSABS 1.4 10/26/2022 0929   LYMPHSABS 1.3 02/25/2015 0950   MONOABS 0.8 10/26/2022 0929   MONOABS 1.5 (H) 02/25/2015 0950   EOSABS 0.3 10/26/2022 0929   EOSABS 0.1 02/25/2015 0950   BASOSABS 0.0 10/26/2022 0929   BASOSABS 0.0 02/25/2015 0950       Latest Ref Rng & Units 11/12/2023    9:03 AM 06/30/2023   10:20 AM 02/26/2023    9:39 AM  BMP  Glucose 70 - 99 mg/dL 94  409  97   BUN 6 - 23 mg/dL 20  17  16    Creatinine 0.40 - 1.20 mg/dL 8.11  9.14  7.82   Sodium 135 - 145 mEq/L 140  141  140   Potassium 3.5 - 5.1 mEq/L 3.7  3.9  3.9   Chloride 96 - 112 mEq/L 103  106  106   CO2 19 - 32 mEq/L 28  28  27    Calcium  8.4 - 10.5 mg/dL 9.3  9.2  9.2      PAST MEDICAL HISTORY :   has a past medical history of Allergy, Anemia, Anxiety, Chicken pox, Complication of anesthesia, Coronary artery disease, Crohn disease (HCC), DVT (  deep venous thrombosis) (HCC) (2017), Dyspnea, Dysrhythmia (2019), GERD (gastroesophageal reflux disease), Heart disease, Heart murmur, Heart murmur, History of kidney stones, Hyperlipidemia, Hypertension, Inflammatory bowel disease, Migraines, Myocardial infarction (HCC), Phlebitis, and PONV (postoperative nausea and vomiting).  has a past surgical history that includes Tonsillectomy and adenoidectomy (1962); tubaligation (1990); heart murmur; Tubal ligation (1993); Eye surgery (Bilateral); Coronary angioplasty with stent (2017); Breast cyst aspiration; Carpal tunnel release (Right, 12/02/2017); Ganglion cyst excision (Right, 12/02/2017); Hysteroscopy with D & C (N/A, 04/19/2020); and Breast biopsy (Left, 07/05/2019). Prior to  Admission medications   Medication Sig Start Date End Date Taking? Authorizing Provider  aspirin  EC 81 MG tablet Take 81 mg by mouth daily.    [provider]  Cholecalciferol 25 MCG (1000 UT) tablet Take 1,000 Units by mouth 2 (two) times daily.    [provider]  clopidogrel  (PLAVIX ) 75 MG tablet Take 75 mg by mouth daily.    [provider]  cyanocobalamin  1000 MCG tablet Take 1,000 mcg by mouth daily at 12 noon.     [provider]  diphenhydrAMINE (BENADRYL) 12.5 MG/5ML elixir Take 12.5 mg by mouth every 6 (six) hours as needed (allergic reaction).    [provider]  EPINEPHrine 0.3 mg/0.3 mL IJ SOAJ injection Inject 0.3 mg into the muscle once.  12/18/13   [provider]  escitalopram  (LEXAPRO ) 20 MG tablet TAKE ONE TABLET BY MOUTH ONCE DAILY 09/23/23   Dellar Fenton, MD  Fe Fum-FePoly-Vit C-Vit B3 (INTEGRA) 62.5-62.5-40-3 MG CAPS TAKE ONE CAPSULE BY MOUTH three times WEEKLY. 10/05/22   Dellar Fenton, MD  fluocinonide (LIDEX) 0.05 % external solution Apply 1 Application topically 2 (two) times daily. 03/26/23 03/25/24  [provider]  losartan (COZAAR) 25 MG tablet Take 25 mg by mouth daily.    [provider]  Melatonin-Pyridoxine 3-10 MG TABS Take 1 tablet by mouth at bedtime.    [provider]  mesalamine  (LIALDA ) 1.2 g EC tablet Take 1.2 g by mouth 3 (three) times daily with meals.     [provider]  mupirocin  ointment (BACTROBAN ) 2 % Apply to affected area bid 02/10/22   Dellar Fenton, MD  nitroGLYCERIN  (NITROSTAT ) 0.4 MG SL tablet DISSOLVE (1) TABLET UNDER TONGUE AS NEEDED TO RELIEVE CHEST PAIN. MAYREPEAT EVERY 5 MINUTES. 12/09/20   Dellar Fenton, MD  ondansetron  (ZOFRAN -ODT) 4 MG disintegrating tablet Take 1 tablet (4 mg total) by mouth daily as needed for nausea. 10/09/21   Dellar Fenton, MD  pantoprazole (PROTONIX) 20 MG tablet Take 1 tablet by mouth daily. 01/18/23   [provider]  prednisoLONE  acetate (PRED FORTE ) 1 % ophthalmic suspension Place 1 drop into both eyes 2 (two) times daily as needed (for inflammation).    [provider]  REPATHA 140 MG/ML SOSY Inject into the skin. 01/20/22   [provider]  rosuvastatin  (CRESTOR ) 5 MG tablet TAKE ONE TABLET BY MOUTH THREE TIMES A WEEK 09/15/23   Dellar Fenton, MD  verapamil  (CALAN ) 40 MG tablet Take 40 mg by mouth every 6 (six) hours as needed.    [provider]  verapamil  (VERELAN ) 180 MG 24 hr capsule Take 180 mg by mouth daily. 11/12/23   [provider]   Allergies  Allergen Reactions   Ciprofloxacin Anaphylaxis   Keflex [Cephalexin] Anaphylaxis   Lisinopril Anaphylaxis   Meclizine Anaphylaxis   Monurol [Fosfomycin Tromethamine   (Obsolete)] Rash    Splotchy rash all over body. Breathing became difficult/tightened. Took benadryl immediately so  she did not swell    Sulfa Antibiotics Anaphylaxis   Tessalon [Benzonatate] Anaphylaxis   Influenza Virus Vaccine Other (See Comments)    History of Epstein Barr   Codeine Nausea And Vomiting    FAMILY HISTORY:  family history includes Breast cancer in her maternal aunt; Cancer in her maternal aunt; Heart disease in her brother and father; Stroke in her mother. SOCIAL HISTORY:  reports that she has never smoked. She has never used smokeless tobacco. She reports that she does not drink alcohol and does not use drugs.   BP 118/78 (BP Location: Right Arm, Patient Position: Sitting, Cuff Size: Normal)   Pulse 65   Temp 98.5 F (36.9 C) (Oral)   Wt 168 lb 3.2 oz (76.3 kg)   SpO2 97%   BMI 31.78 kg/m     Review of Systems: Gen:  Denies  fever, sweats, chills weight loss  HEENT: Denies blurred vision, double vision, ear pain, eye pain, hearing loss, nose bleeds, sore throat Cardiac:  No dizziness, chest pain or heaviness, chest tightness,edema, No JVD Resp:   No cough, -sputum production, -shortness of  breath,-wheezing, -hemoptysis,  Other:  All other systems negative   Physical Examination:   General Appearance: No distress  EYES PERRLA, EOM intact.   NECK Supple, No JVD Pulmonary: normal breath sounds, No wheezing.  CardiovascularNormal S1,S2.  +murmur Abdomen: Benign, Soft, non-tender. Neurology UE/LE 5/5 strength, no focal deficits Ext pulses intact, cap refill intact ALL OTHER ROS ARE NEGATIVE     ASSESSMENT AND PLAN SYNOPSIS 68 yo pleasant white female with  signs and symptoms of excessive daytime sleepiness with diagnosis of obstructive sleep apnea in the setting of  deconditioned state, with hypoxic resp failure  Referral to DME company [employer] R[employer] AIR-TOUCH FIT N30i MASK START CPAP 11 cm h20 Please monitor Blood pressure when you start CPAP therapy  Overweight -recommend significant weight loss -recommend changing diet  Deconditioned state -Recommend increased daily activity and exercise   CURRENT MEDICATIONS REVIEWED AT LENGTH WITH PATIENT TODAY   Patient  satisfied with Plan of action and management. All questions answered   Follow up 3 months   I spent a total of 45 minutes reviewing chart data, face-to-face evaluation with the patient, counseling and coordination of care as detailed above.      Lady Pier, M.D.  [facility]  Medical Director [facility] Medical Director [facility] Cardio-Pulmonary Department

## 2024-03-03 ENCOUNTER — Other Ambulatory Visit

## 2024-03-03 DIAGNOSIS — M2550 Pain in unspecified joint: Secondary | ICD-10-CM | POA: Diagnosis not present

## 2024-03-03 DIAGNOSIS — E7800 Pure hypercholesterolemia, unspecified: Secondary | ICD-10-CM | POA: Diagnosis not present

## 2024-03-03 DIAGNOSIS — E559 Vitamin D deficiency, unspecified: Secondary | ICD-10-CM | POA: Diagnosis not present

## 2024-03-03 LAB — BASIC METABOLIC PANEL WITH GFR
BUN: 20 mg/dL (ref 6–23)
CO2: 28 meq/L (ref 19–32)
Calcium: 9.3 mg/dL (ref 8.4–10.5)
Chloride: 104 meq/L (ref 96–112)
Creatinine, Ser: 0.88 mg/dL (ref 0.40–1.20)
GFR: 67.23 mL/min (ref >60.00)
Glucose, Bld: 97 mg/dL (ref 70–99)
Potassium: 3.9 meq/L (ref 3.5–5.1)
Sodium: 140 meq/L (ref 135–145)

## 2024-03-03 LAB — CBC WITH DIFFERENTIAL/PLATELET
Basophils Absolute: 0.1 10*3/uL (ref 0.0–0.1)
Basophils Relative: 0.9 % (ref 0.0–3.0)
Eosinophils Absolute: 0.3 10*3/uL (ref 0.0–0.7)
Eosinophils Relative: 4.1 % (ref 0.0–5.0)
HCT: 38.3 % (ref 36.0–46.0)
Hemoglobin: 12.9 g/dL (ref 12.0–15.0)
Lymphocytes Relative: 20.1 % (ref 12.0–46.0)
Lymphs Abs: 1.5 10*3/uL (ref 0.7–4.0)
MCHC: 33.8 g/dL (ref 30.0–36.0)
MCV: 94.5 fl (ref 78.0–100.0)
Monocytes Absolute: 0.7 10*3/uL (ref 0.1–1.0)
Monocytes Relative: 8.8 % (ref 3.0–12.0)
Neutro Abs: 4.9 10*3/uL (ref 1.4–7.7)
Neutrophils Relative %: 66.1 % (ref 43.0–77.0)
Platelets: 313 10*3/uL (ref 150.0–400.0)
RBC: 4.05 Mil/uL (ref 3.87–5.11)
RDW: 13.8 % (ref 11.5–15.5)
WBC: 7.5 10*3/uL (ref 4.0–10.5)

## 2024-03-03 LAB — LIPID PANEL
Cholesterol: 135 mg/dL (ref 0–200)
HDL: 47.6 mg/dL (ref >39.00)
LDL Cholesterol: 63 mg/dL (ref 0–99)
NonHDL: 87.25
Total CHOL/HDL Ratio: 3
Triglycerides: 123 mg/dL (ref 0.0–149.0)
VLDL: 24.6 mg/dL (ref 0.0–40.0)

## 2024-03-03 LAB — CK: Total CK: 56 U/L (ref 7–177)

## 2024-03-03 LAB — HEPATIC FUNCTION PANEL
ALT: 17 U/L (ref 0–35)
AST: 18 U/L (ref 0–37)
Albumin: 4.1 g/dL (ref 3.5–5.2)
Alkaline Phosphatase: 75 U/L (ref 39–117)
Bilirubin, Direct: 0.2 mg/dL (ref 0.0–0.3)
Total Bilirubin: 0.7 mg/dL (ref 0.2–1.2)
Total Protein: 6.9 g/dL (ref 6.0–8.3)

## 2024-03-03 LAB — TSH: TSH: 2.85 u[IU]/mL (ref 0.35–5.50)

## 2024-03-03 LAB — VITAMIN D 25 HYDROXY (VIT D DEFICIENCY, FRACTURES): VITD: 31.17 ng/mL (ref 30.00–100.00)

## 2024-03-03 LAB — SEDIMENTATION RATE: Sed Rate: 14 mm/h (ref 0–30)

## 2024-03-05 IMAGING — US US EXTREM LOW DUPLEX ARTERIAL*L* LIMITED
1 series · 14 of 16 positions shown · non-contrast
Comparison: CT abdomen and pelvis 03/28/2017

CLINICAL DATA: Recent heart catheterization. Evaluate for hematoma
or pseudoaneurysm. Left groin pain.

EXAM:
LEFT LOWER EXTREMITY LIMITED SOFT TISSUE ULTRASOUND
TECHNIQUE: Ultrasound examination of the lower extremity soft tissues was
performed in the area of clinical concern.

[Series 1: us lower ext art left ltd · 16 acquisitions, 14 frames shown]
[im 1/16]
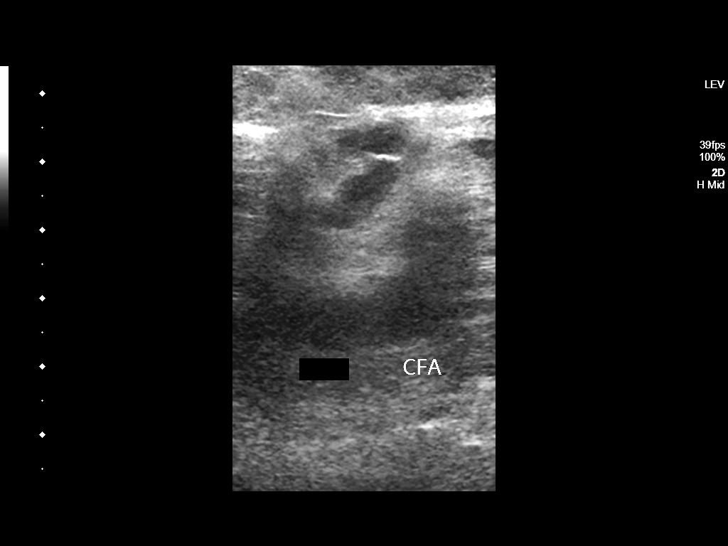
[im 2/16]
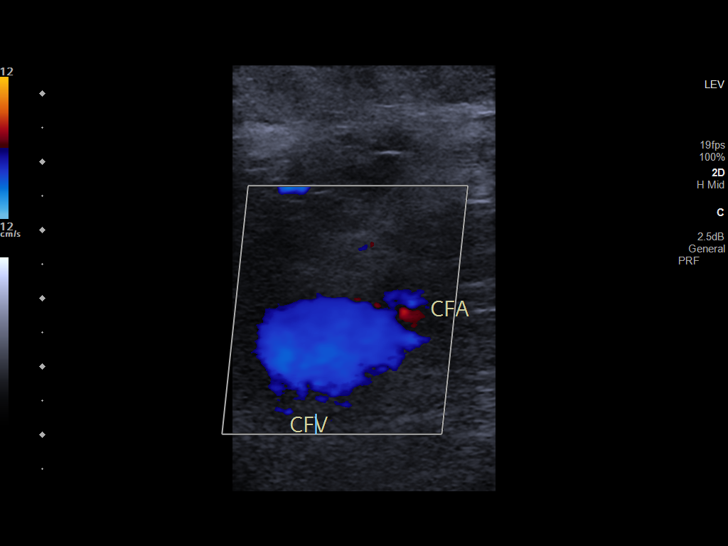
[im 3/16]
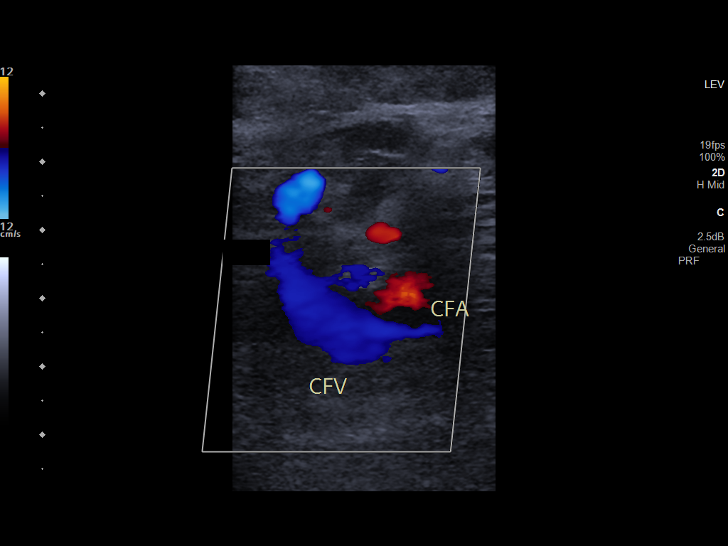
[im 5/16]
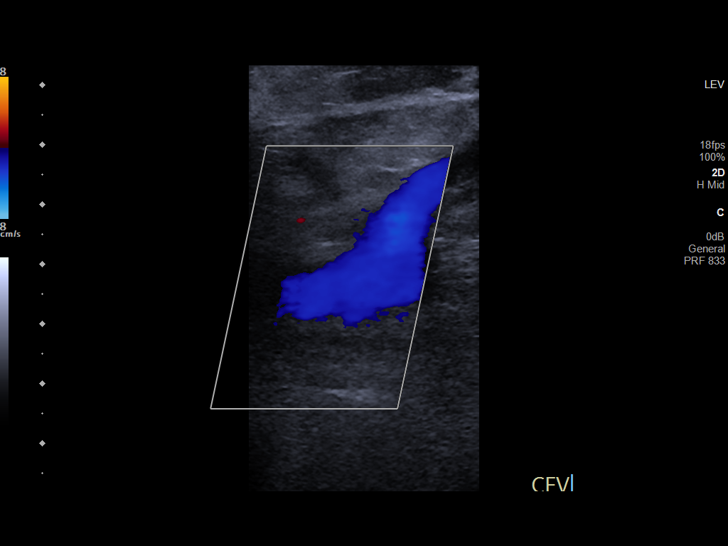
[im 6/16]
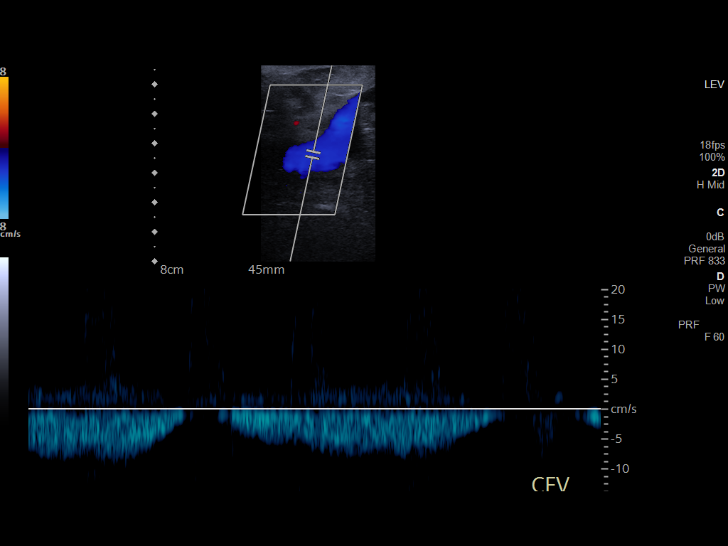
[im 7/16]
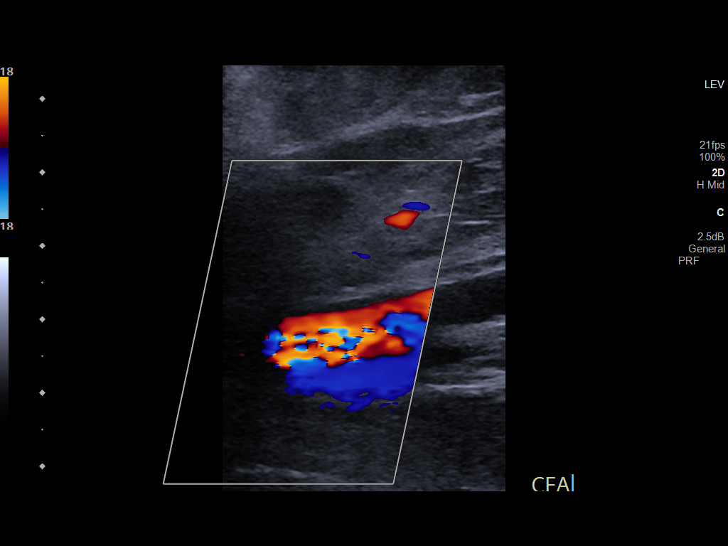
[im 8/16]
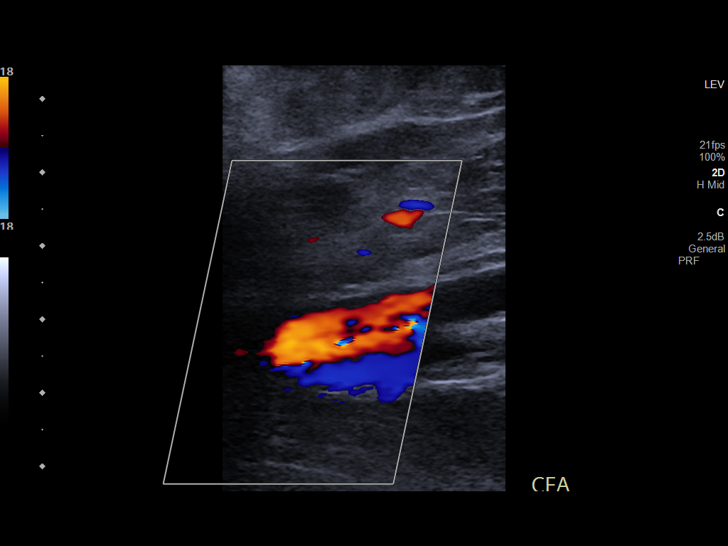
[im 9/16]
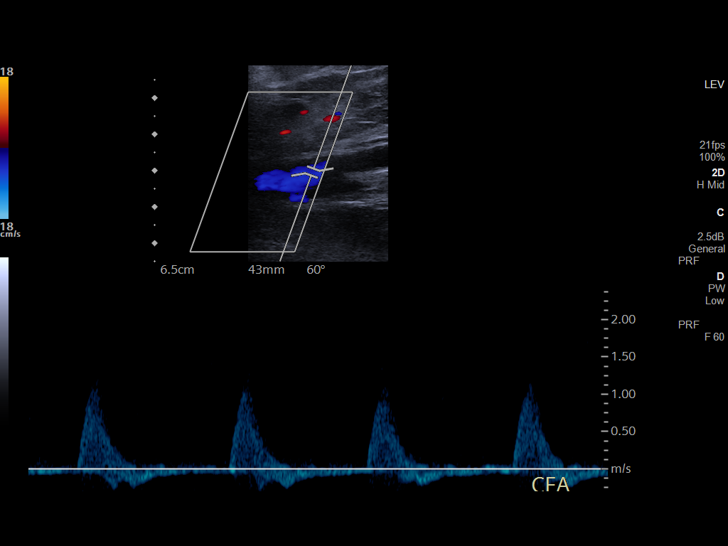
[im 10/16]
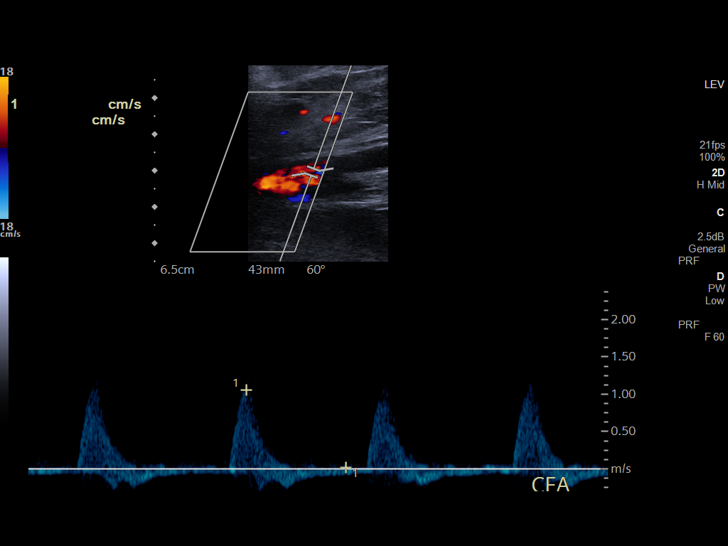
[im 11/16]
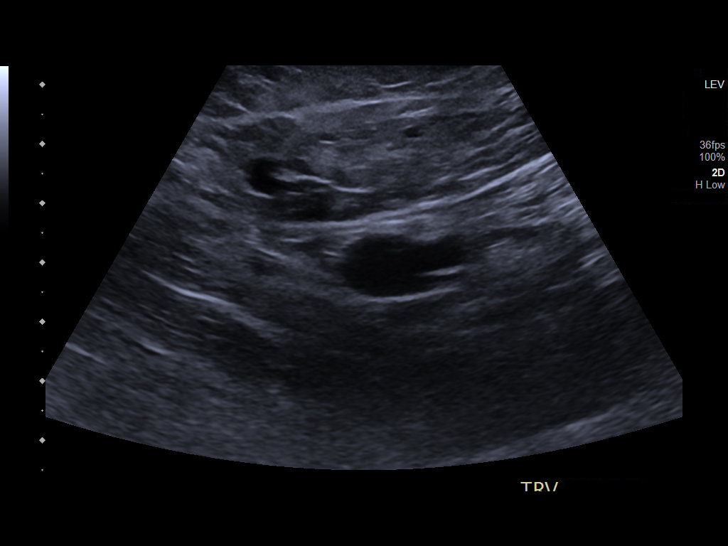
[im 13/16]
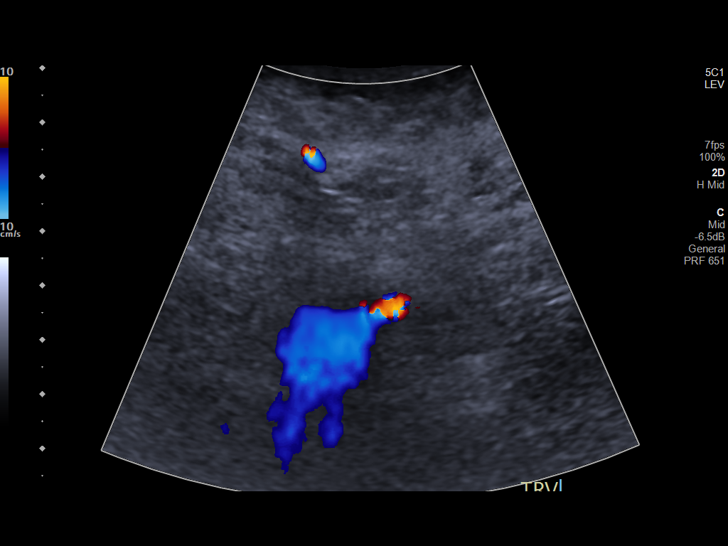
[im 14/16]
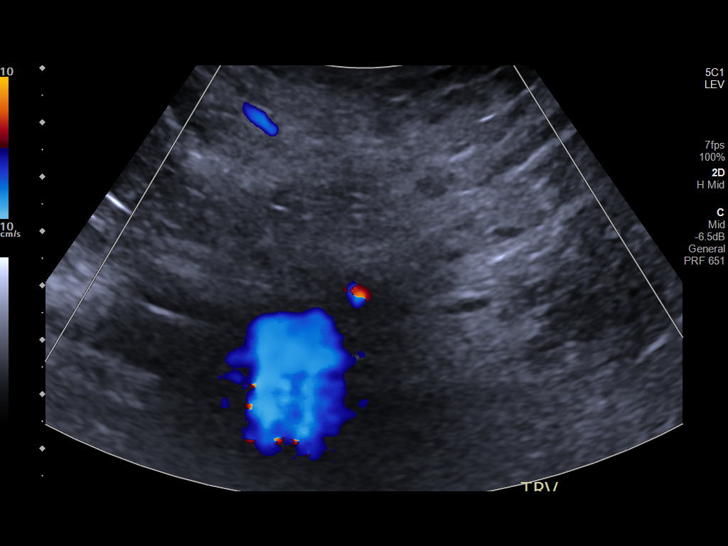
[im 15/16]
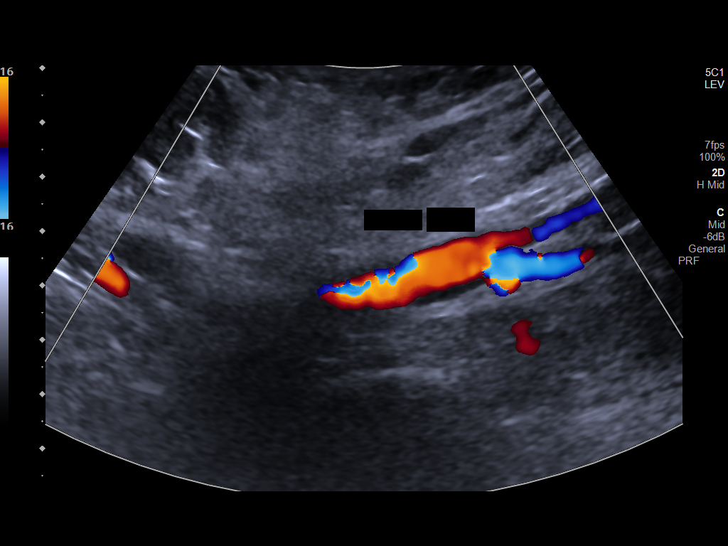
[im 16/16]
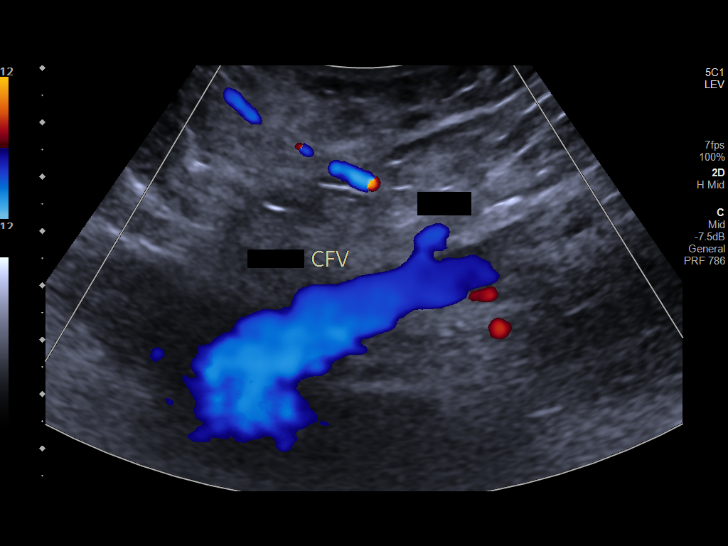

[14 of 16 positions shown; findings below may reference images not displayed]

FINDINGS: Normal appearance of the left common femoral vein and left
saphenofemoral junction. Left common femoral artery is patent. There
is no evidence for a pseudoaneurysm. No large hematoma formation.
IMPRESSION: Negative for pseudoaneurysm in the left groin.  No large hematoma.

## 2024-03-06 ENCOUNTER — Telehealth: Payer: Self-pay

## 2024-03-06 NOTE — Telephone Encounter (Signed)
 I spoke with the patient. She would like the order sent to [facility].

## 2024-03-06 NOTE — Telephone Encounter (Signed)
 Copied from CRM [id number]. Topic: Clinical - Prescription Issue >> Mar 06, 2024  3:10 PM Tyronne Galloway wrote: Reason for CRM: Pt called and stated [facility] have terrible reviews and are rated a 1 star. Patient is concerned on starting and [facility] for her CPAP machine. Patient is wondering if there's any alternatives for CPAP machine instead of using [facility]. Please call the patient at [phone] ok to leave a vm.

## 2024-03-06 NOTE — Telephone Encounter (Signed)
 Lm x1 for the patient.

## 2024-03-07 ENCOUNTER — Ambulatory Visit: Admitting: Internal Medicine

## 2024-03-07 ENCOUNTER — Encounter: Payer: Self-pay | Admitting: Internal Medicine

## 2024-03-07 VITALS — BP 110/72 | HR 66 | Temp 98.0°F | Resp 16 | Ht 61.0 in | Wt 167.2 lb

## 2024-03-07 DIAGNOSIS — I10 Essential (primary) hypertension: Secondary | ICD-10-CM | POA: Diagnosis not present

## 2024-03-07 DIAGNOSIS — I779 Disorder of arteries and arterioles, unspecified: Secondary | ICD-10-CM

## 2024-03-07 DIAGNOSIS — I700 Atherosclerosis of aorta: Secondary | ICD-10-CM | POA: Diagnosis not present

## 2024-03-07 DIAGNOSIS — I25118 Atherosclerotic heart disease of native coronary artery with other forms of angina pectoris: Secondary | ICD-10-CM | POA: Diagnosis not present

## 2024-03-07 DIAGNOSIS — M25572 Pain in left ankle and joints of left foot: Secondary | ICD-10-CM

## 2024-03-07 DIAGNOSIS — E7800 Pure hypercholesterolemia, unspecified: Secondary | ICD-10-CM

## 2024-03-07 DIAGNOSIS — E559 Vitamin D deficiency, unspecified: Secondary | ICD-10-CM

## 2024-03-07 DIAGNOSIS — M25562 Pain in left knee: Secondary | ICD-10-CM | POA: Diagnosis not present

## 2024-03-07 DIAGNOSIS — G4730 Sleep apnea, unspecified: Secondary | ICD-10-CM

## 2024-03-07 DIAGNOSIS — K219 Gastro-esophageal reflux disease without esophagitis: Secondary | ICD-10-CM

## 2024-03-07 DIAGNOSIS — I872 Venous insufficiency (chronic) (peripheral): Secondary | ICD-10-CM | POA: Diagnosis not present

## 2024-03-07 DIAGNOSIS — F320 Major depressive disorder, single episode, mild: Secondary | ICD-10-CM

## 2024-03-07 MED ORDER — ESCITALOPRAM OXALATE 10 MG PO TABS: 10.0000 mg | ORAL_TABLET | Freq: Every day | ORAL | 1 refills | Status: DC

## 2024-03-07 MED ORDER — ROSUVASTATIN CALCIUM 5 MG PO TABS: ORAL_TABLET | ORAL | 1 refills | Status: DC

## 2024-03-07 NOTE — Assessment & Plan Note (Signed)
 Saw Dr Auston Left. Planning CPAP - 11.

## 2024-03-07 NOTE — Assessment & Plan Note (Signed)
 Overall appears to be doing well. On lexapro .  She decreased herself to 10mg  q day. Doing well on this dose. Follow.

## 2024-03-07 NOTE — Assessment & Plan Note (Signed)
 Continue crestor , repatha and aspirin .  Continue risk factor modification.  Saw Dr Prescilla Brod 04/2022 - Given the patient's asymptomatic subcritical stenosis no further invasive testing or surgery recommended. Duplex ultrasound shows 1-39% stenosis bilaterally (04/08/23). Continue antiplatelet therapy as prescribed Continue management of CAD, HTN and Hyperlipidemia. Follow up in 12 months with duplex ultrasound and physical exam. Is scheduled for f/u.

## 2024-03-07 NOTE — Telephone Encounter (Signed)
 The referral, notes, sleep study have been faxed to [facility] fax # [phone]. Dr. Kasa has received the CMN to sign for her

## 2024-03-07 NOTE — Assessment & Plan Note (Signed)
 Recent vitamin D  level wnl. Continue vitamin D3 1000 units q day.

## 2024-03-07 NOTE — Progress Notes (Signed)
 Subjective:    Patient ID: Morgan Moore, female    DOB: 12-Sep-1956, 68 y.o.   MRN: [id number]  Patient here for  Chief Complaint  Patient presents with   Medical Management of Chronic Issues    HPI Here for a scheduled follow up. Had f/u with pulmonary 02/29/24 - f/u OSA. Recommended to start cpap 11.  Saw cardiology 02/21/24 - f/u elevated blood pressure. Recommended taking losartan 25mg  and spironolactone 12.5mg  in the am. Take 60mg  verapamil  at 5pm and losartan 25mg  in the evening. Had previously seen Dr Beau Bound - after earlier ER visit. Stress echo - normal. Had holter placed and normal cardiac MRI. Also reported joint aches. Was advised to hold crestor . She is taking crestor  2x/week now. Taking repatha. Describes joint stiffness. Notices when first gets up. After walking and up for a while, symptoms improved. Discussed arthritis. Discussed recent labs - CT and ESR wnl. She is having some increased pain - left knee. Remote injury. Desires no further intervention at this time. Left foot pain - fifth metatarsal. Desires no further intervention. Discussed xray. Some swelling and history of phlebitis - left lower leg. Has seen [facility] previously. Wearing compression sock/hose. Breathing stable. No chest pain. Eating. No bowel issues.    Past Medical History:  Diagnosis Date   Allergy    Anemia    Anxiety    Chicken pox    Complication of anesthesia    vomitting   Coronary artery disease    Crohn disease (HCC)    DVT (deep venous thrombosis) (HCC) 2017   Dyspnea    on excertion   Dysrhythmia 2019   palpitations   GERD (gastroesophageal reflux disease)    Heart disease    Heart murmur    Heart murmur    History of kidney stones    Hyperlipidemia    Hypertension    Inflammatory bowel disease    Migraines    hormonal, puberty   Myocardial infarction (HCC)    Phlebitis    PONV (postoperative nausea and vomiting)    Sleep apnea 02/29/2024   Past Surgical History:   Procedure Laterality Date   BREAST BIOPSY Left 07/05/2019   FIBROEPITHELIAL lesion/neg   BREAST CYST ASPIRATION     unsure of side   CARPAL TUNNEL RELEASE Right 12/02/2017   Procedure: CARPAL TUNNEL RELEASE ENDOSCOPIC;  Surgeon: Elner Hahn, MD;  Location: [facility] ORS;  Service: Orthopedics;  Laterality: Right;   CORONARY ANGIOPLASTY WITH STENT PLACEMENT  2017   x 2   EYE SURGERY Bilateral    cataract surgery   GANGLION CYST EXCISION Right 12/02/2017   Procedure: REMOVAL GANGLION OF WRIST;  Surgeon: Elner Hahn, MD;  Location: [facility] ORS;  Service: Orthopedics;  Laterality: Right;   heart murmur     HYSTEROSCOPY WITH D & C N/A 04/19/2020   Procedure: DILATATION AND CURETTAGE /HYSTEROSCOPY, POSSIBLE  POLYPECTOMY;  Surgeon: Prescilla Brod, MD;  Location: [facility] ORS;  Service: Gynecology;  Laterality: N/A;   TONSILLECTOMY AND ADENOIDECTOMY  1962   TUBAL LIGATION  1993   tubaligation  1990   Family History  Problem Relation Age of Onset   Heart disease Father    Heart disease Brother    Cancer Maternal Aunt    Breast cancer Maternal Aunt    Stroke Mother    Kidney disease Neg Hx    GU problems Neg Hx    Kidney cancer Neg Hx    Social History   Socioeconomic History  Marital status: Single    Spouse name: Not on file   Number of children: Not on file   Years of education: Not on file   Highest education level: Not on file  Occupational History   Not on file  Tobacco Use   Smoking status: Never   Smokeless tobacco: Never  Vaping Use   Vaping status: Never Used  Substance and Sexual Activity   Alcohol use: No    Alcohol/week: 0.0 standard drinks of alcohol   Drug use: No   Sexual activity: Not on file  Other Topics Concern   Not on file  Social History Narrative   Had 2 kids. Daughter died in 03/24/12 son living    Former Writer    Social Drivers of Health   Financial Resource Strain: Low Risk  (04/19/2023)   Overall Financial Resource Strain (CARDIA)     Difficulty of Paying Living Expenses: Not hard at all  Food Insecurity: No Food Insecurity (04/19/2023)   Hunger Vital Sign    Worried About Running Out of Food in the Last Year: Never true    Ran Out of Food in the Last Year: Never true  Transportation Needs: No Transportation Needs (04/19/2023)   PRAPARE - Administrator, Civil Service (Medical): No    Lack of Transportation (Non-Medical): No  Physical Activity: Sufficiently Active (04/19/2023)   Exercise Vital Sign    Days of Exercise per Week: 5 days    Minutes of Exercise per Session: 30 min  Recent Concern: Physical Activity - Insufficiently Active (04/19/2023)   Exercise Vital Sign    Days of Exercise per Week: 3 days    Minutes of Exercise per Session: 30 min  Stress: No Stress Concern Present (04/19/2023)   [employer] of Occupational Health - Occupational Stress Questionnaire    Feeling of Stress : Not at all  Social Connections: Unknown (04/19/2023)   Social Connection and Isolation Panel [NHANES]    Frequency of Communication with Friends and Family: More than three times a week    Frequency of Social Gatherings with Friends and Family: Twice a week    Attends Religious Services: More than 4 times per year    Active Member of [employer] or Organizations: No    Attends Banker Meetings: Never    Marital Status: Not on file     Review of Systems  Constitutional:  Negative for appetite change and unexpected weight change.  HENT:  Negative for congestion and sinus pressure.   Respiratory:  Negative for cough, chest tightness and shortness of breath.   Cardiovascular:  Negative for chest pain and palpitations.       Has had issues with left lower extremity/ankle swelling and tenderness.   Gastrointestinal:  Negative for abdominal pain, diarrhea, nausea and vomiting.  Genitourinary:  Negative for difficulty urinating and dysuria.  Musculoskeletal:  Negative for myalgias.       Joint stiffness as  outlined.   Skin:  Negative for rash.       Recently issues with "phlebitis" left lower legs. No acute flare currently.   Neurological:  Negative for headaches.       Occasional light headedness.   Psychiatric/Behavioral:  Negative for agitation and dysphoric mood.        Increased stress.        Objective:     BP 110/72   Pulse 66   Temp 98 F (36.7 C)   Resp 16   Ht  5\' 1"  (1.549 m)   Wt 167 lb 3.2 oz (75.8 kg)   SpO2 98%   BMI 31.59 kg/m  Wt Readings from Last 3 Encounters:  03/07/24 167 lb 3.2 oz (75.8 kg)  02/29/24 168 lb 3.2 oz (76.3 kg)  11/29/23 165 lb 6.4 oz (75 kg)    Physical Exam Vitals reviewed.  Constitutional:      General: She is not in acute distress.    Appearance: Normal appearance.  HENT:     Head: Normocephalic and atraumatic.     Right Ear: External ear normal.     Left Ear: External ear normal.     Mouth/Throat:     Pharynx: No oropharyngeal exudate or posterior oropharyngeal erythema.  Eyes:     General: No scleral icterus.       Right eye: No discharge.        Left eye: No discharge.     Conjunctiva/sclera: Conjunctivae normal.  Neck:     Thyroid : No thyromegaly.  Cardiovascular:     Rate and Rhythm: Normal rate and regular rhythm.  Pulmonary:     Effort: No respiratory distress.     Breath sounds: Normal breath sounds. No wheezing.  Abdominal:     General: Bowel sounds are normal.     Palpations: Abdomen is soft.     Tenderness: There is no abdominal tenderness.  Musculoskeletal:     Cervical back: Neck supple. No tenderness.     Comments: DP pulses palpable and equal bilateral. Some increased soft tissue - left lower ankle.   Lymphadenopathy:     Cervical: No cervical adenopathy.  Skin:    Findings: No erythema or rash.  Neurological:     Mental Status: She is alert.  Psychiatric:        Mood and Affect: Mood normal.        Behavior: Behavior normal.         Outpatient Encounter Medications as of 03/07/2024   Medication Sig   escitalopram  (LEXAPRO ) 10 MG tablet Take 1 tablet (10 mg total) by mouth daily.   aspirin  EC 81 MG tablet Take 81 mg by mouth daily.   Cholecalciferol 25 MCG (1000 UT) tablet Take 1,000 Units by mouth 2 (two) times daily.   clopidogrel  (PLAVIX ) 75 MG tablet Take 75 mg by mouth daily.   cyanocobalamin  1000 MCG tablet Take 1,000 mcg by mouth daily at 12 noon.    diphenhydrAMINE (BENADRYL) 12.5 MG/5ML elixir Take 12.5 mg by mouth every 6 (six) hours as needed (allergic reaction).   EPINEPHrine 0.3 mg/0.3 mL IJ SOAJ injection Inject 0.3 mg into the muscle once.    Fe Fum-FePoly-Vit C-Vit B3 (INTEGRA) 62.5-62.5-40-3 MG CAPS TAKE ONE CAPSULE BY MOUTH three times WEEKLY.   fluocinonide (LIDEX) 0.05 % external solution Apply 1 Application topically 2 (two) times daily.   losartan (COZAAR) 25 MG tablet Take 25 mg by mouth daily.   Melatonin-Pyridoxine 3-10 MG TABS Take 1 tablet by mouth at bedtime.   mesalamine  (LIALDA ) 1.2 g EC tablet Take 1.2 g by mouth 3 (three) times daily with meals.    metroNIDAZOLE  (METROGEL ) 1 % gel Apply topically.   mupirocin  ointment (BACTROBAN ) 2 % Apply to affected area bid   nitroGLYCERIN  (NITROSTAT ) 0.4 MG SL tablet DISSOLVE (1) TABLET UNDER TONGUE AS NEEDED TO RELIEVE CHEST PAIN. MAYREPEAT EVERY 5 MINUTES.   ondansetron  (ZOFRAN -ODT) 4 MG disintegrating tablet Take 1 tablet (4 mg total) by mouth daily as needed for nausea.  pantoprazole (PROTONIX) 20 MG tablet Take 1 tablet by mouth daily.   prednisoLONE  acetate (PRED FORTE ) 1 % ophthalmic suspension Place 1 drop into both eyes 2 (two) times daily as needed (for inflammation).   REPATHA 140 MG/ML SOSY Inject into the skin.   rosuvastatin  (CRESTOR ) 5 MG tablet TAKE ONE TABLET BY MOUTH TWO TIMES A WEEK   spironolactone (ALDACTONE) 25 MG tablet Take 12.5 mg by mouth.   verapamil  (CALAN -SR) 120 MG CR tablet Take 120 mg by mouth.   [DISCONTINUED] escitalopram  (LEXAPRO ) 20 MG tablet TAKE ONE TABLET BY MOUTH  ONCE DAILY   [DISCONTINUED] rosuvastatin  (CRESTOR ) 5 MG tablet TAKE ONE TABLET BY MOUTH THREE TIMES A WEEK   No facility-administered encounter medications on file as of 03/07/2024.     Lab Results  Component Value Date   WBC 7.5 03/03/2024   HGB 12.9 03/03/2024   HCT 38.3 03/03/2024   PLT 313.0 03/03/2024   GLUCOSE 97 03/03/2024   CHOL 135 03/03/2024   TRIG 123.0 03/03/2024   HDL 47.60 03/03/2024   LDLCALC 63 03/03/2024   ALT 17 03/03/2024   AST 18 03/03/2024   NA 140 03/03/2024   K 3.9 03/03/2024   CL 104 03/03/2024   CREATININE 0.88 03/03/2024   BUN 20 03/03/2024   CO2 28 03/03/2024   TSH 2.85 03/03/2024   INR 1.0 12/23/2017    MR CARDIAC MORPHOLOGY W WO CONTRAST Result Date: 01/20/2024 CLINICAL DATA:  SVT, palpitations, NSTEMI EXAM: CARDIAC MRI TECHNIQUE: The patient was scanned on a 1.5 Tesla Siemens magnet. A dedicated cardiac coil was used. Functional imaging was done using Fiesta sequences. 2,3, and 4 chamber views were done to assess for RWMA's. Modified Simpson's rule using a short axis stack was used to calculate an ejection fraction on a dedicated work Research officer, trade union. The patient received 10 cc of Gadavist . After 10 minutes inversion recovery sequences were used to assess for infiltration and scar tissue. Velocity flow mapping performed in the ascending aorta and main pulmonary artery. CONTRAST:  10 cc  of Gadavist  FINDINGS: 1. Normal left ventricular size, thickness and systolic function (LVEF = 65%). There are no regional wall motion abnormalities. There is no late gadolinium enhancement in the left ventricular myocardium. LVEDV: 107 ml LVESV: 37 ml SV: 70 ml CO: 3.8 L/min Myocardial mass: 83g 2. Normal right ventricular size, thickness and systolic function (RVEF = 54%). There are no regional wall motion abnormalities. 3.  Normal left and right atrial size. 4. Normal size of the aortic root, ascending aorta and pulmonary artery. 5.  No significant valvular  abnormalities. 6.  Normal pericardium.  No pericardial effusion. IMPRESSION: 1.  Normal LV systolic function.  LVEF 65%. 2.  No LGE noted. 3.  No evidence for infiltrative disease. 4.  Normal RV function. 5.  No significant valvular abnormalities. Electronically Signed   By: Constancia Delton M.D.   On: 01/20/2024 13:09   MR CARDIAC VELOCITY FLOW MAP Result Date: 01/20/2024 CLINICAL DATA:  SVT, palpitations, NSTEMI EXAM: CARDIAC MRI TECHNIQUE: The patient was scanned on a 1.5 Tesla Siemens magnet. A dedicated cardiac coil was used. Functional imaging was done using Fiesta sequences. 2,3, and 4 chamber views were done to assess for RWMA's. Modified Simpson's rule using a short axis stack was used to calculate an ejection fraction on a dedicated work Research officer, trade union. The patient received 10 cc of Gadavist . After 10 minutes inversion recovery sequences were used to assess for infiltration and scar  tissue. Velocity flow mapping performed in the ascending aorta and main pulmonary artery. CONTRAST:  10 cc  of Gadavist  FINDINGS: 1. Normal left ventricular size, thickness and systolic function (LVEF = 65%). There are no regional wall motion abnormalities. There is no late gadolinium enhancement in the left ventricular myocardium. LVEDV: 107 ml LVESV: 37 ml SV: 70 ml CO: 3.8 L/min Myocardial mass: 83g 2. Normal right ventricular size, thickness and systolic function (RVEF = 54%). There are no regional wall motion abnormalities. 3.  Normal left and right atrial size. 4. Normal size of the aortic root, ascending aorta and pulmonary artery. 5.  No significant valvular abnormalities. 6.  Normal pericardium.  No pericardial effusion. IMPRESSION: 1.  Normal LV systolic function.  LVEF 65%. 2.  No LGE noted. 3.  No evidence for infiltrative disease. 4.  Normal RV function. 5.  No significant valvular abnormalities. Electronically Signed   By: Constancia Delton M.D.   On: 01/20/2024 13:09   MR CARDIAC VELOCITY  FLOW MAP Result Date: 01/20/2024 CLINICAL DATA:  SVT, palpitations, NSTEMI EXAM: CARDIAC MRI TECHNIQUE: The patient was scanned on a 1.5 Tesla Siemens magnet. A dedicated cardiac coil was used. Functional imaging was done using Fiesta sequences. 2,3, and 4 chamber views were done to assess for RWMA's. Modified Simpson's rule using a short axis stack was used to calculate an ejection fraction on a dedicated work Research officer, trade union. The patient received 10 cc of Gadavist . After 10 minutes inversion recovery sequences were used to assess for infiltration and scar tissue. Velocity flow mapping performed in the ascending aorta and main pulmonary artery. CONTRAST:  10 cc  of Gadavist  FINDINGS: 1. Normal left ventricular size, thickness and systolic function (LVEF = 65%). There are no regional wall motion abnormalities. There is no late gadolinium enhancement in the left ventricular myocardium. LVEDV: 107 ml LVESV: 37 ml SV: 70 ml CO: 3.8 L/min Myocardial mass: 83g 2. Normal right ventricular size, thickness and systolic function (RVEF = 54%). There are no regional wall motion abnormalities. 3.  Normal left and right atrial size. 4. Normal size of the aortic root, ascending aorta and pulmonary artery. 5.  No significant valvular abnormalities. 6.  Normal pericardium.  No pericardial effusion. IMPRESSION: 1.  Normal LV systolic function.  LVEF 65%. 2.  No LGE noted. 3.  No evidence for infiltrative disease. 4.  Normal RV function. 5.  No significant valvular abnormalities. Electronically Signed   By: Constancia Delton M.D.   On: 01/20/2024 13:09       Assessment & Plan:  Left ankle pain, unspecified chronicity Assessment & Plan: Heel/ankle - pain soft tissue swelling. Wearing compression sock/hose. Reports recent phlebitis flare. Better today. Plan f/u with [facility]. Fifth metatarsal pain. Wants to hold on further evaluation. Will notify me if desires xray or further evaluation.       Hypercholesterolemia Assessment & Plan: Continue crestor . Taking 1 tablet 2x/week. Given CAD history, will continue low dose crestor  and repatha. Stiffness appears to be more related to arthritis. Better with movement. Follow.   Orders: -     Basic metabolic panel with GFR; Future -     Hepatic function panel; Future -     Lipid panel; Future  Aortic atherosclerosis (HCC) Assessment & Plan: Continue crestor  and repatha.    Carotid artery disease, unspecified laterality, unspecified type (HCC) Assessment & Plan: Continue crestor , repatha and aspirin .  Continue risk factor modification.  Saw Dr Prescilla Brod 04/2022 - Given the patient's asymptomatic  subcritical stenosis no further invasive testing or surgery recommended. Duplex ultrasound shows 1-39% stenosis bilaterally (04/08/23). Continue antiplatelet therapy as prescribed Continue management of CAD, HTN and Hyperlipidemia. Follow up in 12 months with duplex ultrasound and physical exam. Is scheduled for f/u.    Chronic venous insufficiency Assessment & Plan: Compression hose. Plan f/u with [facility] as outlined.    Coronary artery disease of native artery of native heart with stable angina pectoris [facility]) Assessment & Plan: Continue crestor  and aspirin .  Continue risk factor modification.  Seeing cardiology. Medication adjustments recommended as outlined. Plans to gradually make changes. Follow.    Essential hypertension Assessment & Plan: Saw cardiology 02/21/24 - f/u elevated blood pressure. Recommended taking losartan 25mg  and spironolactone 12.5mg  in the am. Take 60mg  verapamil  at 5pm and losartan 25mg  in the evening. She has not made the change yet. Plans to gradually adjust. Treat sleep apnea.    Gastroesophageal reflux disease, unspecified whether esophagitis present Assessment & Plan: No upper symptoms reported.  On protonix.    Left knee pain, unspecified chronicity Assessment & Plan: Has seen ortho.  MRI has been  performed. S/p aspiration of joint fluid.  MRI - moderate joint effusion, sprain meniscal tear.  Desires no further intervention at this time. Follow. Notify me if desires further intervention.    Major depressive disorder, single episode, mild (HCC) Assessment & Plan: Overall appears to be doing well. On lexapro .  She decreased herself to 10mg  q day. Doing well on this dose. Follow.    Sleep apnea, unspecified type Assessment & Plan: Saw Dr Auston Left. Planning CPAP - 11.    Vitamin D  deficiency Assessment & Plan: Recent vitamin D  level wnl. Continue vitamin D3 1000 units q day.    Other orders -     Rosuvastatin  Calcium ; TAKE ONE TABLET BY MOUTH TWO TIMES A WEEK  Dispense: 26 tablet; Refill: 1 -     Escitalopram  Oxalate; Take 1 tablet (10 mg total) by mouth daily.  Dispense: 90 tablet; Refill: 1     Dellar Fenton, MD

## 2024-03-07 NOTE — Assessment & Plan Note (Signed)
No upper symptoms reported.  On protonix.   

## 2024-03-07 NOTE — Assessment & Plan Note (Signed)
 Heel/ankle - pain soft tissue swelling. Wearing compression sock/hose. Reports recent phlebitis flare. Better today. Plan f/u with [facility]. Fifth metatarsal pain. Wants to hold on further evaluation. Will notify me if desires xray or further evaluation.

## 2024-03-07 NOTE — Assessment & Plan Note (Signed)
 Continue crestor  and repatha.

## 2024-03-07 NOTE — Assessment & Plan Note (Signed)
 Compression hose. Plan f/u with [facility] as outlined.

## 2024-03-07 NOTE — Assessment & Plan Note (Signed)
 Saw cardiology 02/21/24 - f/u elevated blood pressure. Recommended taking losartan 25mg  and spironolactone 12.5mg  in the am. Take 60mg  verapamil  at 5pm and losartan 25mg  in the evening. She has not made the change yet. Plans to gradually adjust. Treat sleep apnea.

## 2024-03-07 NOTE — Assessment & Plan Note (Signed)
 Continue crestor . Taking 1 tablet 2x/week. Given CAD history, will continue low dose crestor  and repatha. Stiffness appears to be more related to arthritis. Better with movement. Follow.

## 2024-03-07 NOTE — Assessment & Plan Note (Signed)
 Continue crestor  and aspirin .  Continue risk factor modification.  Seeing cardiology. Medication adjustments recommended as outlined. Plans to gradually make changes. Follow.

## 2024-03-07 NOTE — Assessment & Plan Note (Signed)
 Has seen ortho.  MRI has been performed. S/p aspiration of joint fluid.  MRI - moderate joint effusion, sprain meniscal tear.  Desires no further intervention at this time. Follow. Notify me if desires further intervention.

## 2024-03-08 NOTE — Telephone Encounter (Signed)
 CMN has been signed and faxed back.  Nothing further needed.

## 2024-03-16 ENCOUNTER — Other Ambulatory Visit: Payer: Self-pay | Admitting: Internal Medicine

## 2024-03-28 ENCOUNTER — Encounter (INDEPENDENT_AMBULATORY_CARE_PROVIDER_SITE_OTHER): Payer: Self-pay

## 2024-04-05 NOTE — Progress Notes (Addendum)
 MRN : [id number]  Morgan Moore is a 68 y.o. (Feb 27, 1956) female who presents with chief complaint of check carotid arteries.  History of Present Illness:   The patient is seen for follow up evaluation of carotid stenosis. The carotid stenosis followed by ultrasound.   The patient denies amaurosis fugax. There is no recent history of TIA symptoms or focal motor deficits. There is no prior documented CVA.  The patient is taking enteric-coated aspirin  81 mg daily.  There is no history of migraine headaches. There is no history of seizures.  As a second issue she is complaining of much more pain in the ankle associated with her varicose veins.  Left greater than right.  She notes that the veins have become increasingly more symptomatic particularly with standing she is experiencing pain on a daily basis.  She also notes that she has had multiple episodes of phlebitis again left ankle more so than the right.  She also notes that the veins are now associated with a significant worsening of swelling.  No recent shortening of the patient's walking distance or new symptoms consistent with claudication.  No history of rest pain symptoms. No new ulcers or wounds of the lower extremities have occurred.  There is no history of DVT or PE. No documented recent episodes of angina or shortness of breath documented.   Duplex ultrasound of the carotid arteries obtained today demonstrates RICA 1 to 39% and LICA 40-59% stenosis.  Slight increase on the left compared to the previous study.  No outpatient medications have been marked as taking for the 04/06/24 encounter (Appointment) with Jama, Cordella MATSU, MD.    Past Medical History:  Diagnosis Date   Allergy    Anemia    Anxiety    Chicken pox    Complication of anesthesia    vomitting   Coronary artery disease    Crohn disease (HCC)    DVT (deep venous thrombosis) (HCC) 2017   Dyspnea    on excertion    Dysrhythmia 2019   palpitations   GERD (gastroesophageal reflux disease)    Heart disease    Heart murmur    Heart murmur    History of kidney stones    Hyperlipidemia    Hypertension    Inflammatory bowel disease    Migraines    hormonal, puberty   Myocardial infarction (HCC)    Phlebitis    PONV (postoperative nausea and vomiting)    Sleep apnea 02/29/2024    Past Surgical History:  Procedure Laterality Date   BREAST BIOPSY Left 07/05/2019   FIBROEPITHELIAL lesion/neg   BREAST CYST ASPIRATION     unsure of side   CARPAL TUNNEL RELEASE Right 12/02/2017   Procedure: CARPAL TUNNEL RELEASE ENDOSCOPIC;  Surgeon: Edie Norleen PARAS, MD;  Location: [facility];  Service: Orthopedics;  Laterality: Right;   CORONARY ANGIOPLASTY WITH STENT PLACEMENT  2017   x 2   EYE SURGERY Bilateral    cataract surgery   GANGLION CYST EXCISION Right 12/02/2017   Procedure: REMOVAL GANGLION OF WRIST;  Surgeon: Edie Norleen PARAS, MD;  Location: [facility];  Service: Orthopedics;  Laterality: Right;   heart murmur     HYSTEROSCOPY WITH D & C N/A 04/19/2020   Procedure: DILATATION AND CURETTAGE /HYSTEROSCOPY, POSSIBLE  POLYPECTOMY;  Surgeon: Verdon Keen, MD;  Location: [facility];  Service:  Gynecology;  Laterality: N/A;   TONSILLECTOMY AND ADENOIDECTOMY  1962   TUBAL LIGATION  1993   tubaligation  1990    Social History Social History   Tobacco Use   Smoking status: Never   Smokeless tobacco: Never  Vaping Use   Vaping status: Never Used  Substance Use Topics   Alcohol use: No    Alcohol/week: 0.0 standard drinks of alcohol   Drug use: No    Family History Family History  Problem Relation Age of Onset   Heart disease Father    Heart disease Brother    Cancer Maternal Aunt    Breast cancer Maternal Aunt    Stroke Mother    Kidney disease Neg Hx    GU problems Neg Hx    Kidney cancer Neg Hx     Allergies  Allergen Reactions   Ciprofloxacin Anaphylaxis   Keflex [Cephalexin] Anaphylaxis    Lisinopril Anaphylaxis   Meclizine Anaphylaxis   Monurol [Fosfomycin Tromethamine   (Obsolete)] Rash    Splotchy rash all over body. Breathing became difficult/tightened. Took benadryl immediately so she did not swell    Sulfa Antibiotics Anaphylaxis   Tessalon [Benzonatate] Anaphylaxis   Influenza Virus Vaccine Other (See Comments)    History of Epstein Barr   Codeine Nausea And Vomiting     REVIEW OF SYSTEMS (Negative unless checked)  Constitutional: [] Weight loss  [] Fever  [] Chills Cardiac: [] Chest pain   [] Chest pressure   [] Palpitations   [] Shortness of breath when laying flat   [] Shortness of breath with exertion. Vascular:  [x] Pain in legs with walking   [] Pain in legs at rest  [] History of DVT   [] Phlebitis   [] Swelling in legs   [] Varicose veins   [] Non-healing ulcers Pulmonary:   [] Uses home oxygen   [] Productive cough   [] Hemoptysis   [] Wheeze  [] COPD   [] Asthma Neurologic:  [] Dizziness   [] Seizures   [] History of stroke   [] History of TIA  [] Aphasia   [] Vissual changes   [] Weakness or numbness in arm   [] Weakness or numbness in leg Musculoskeletal:   [] Joint swelling   [] Joint pain   [] Low back pain Hematologic:  [] Easy bruising  [] Easy bleeding   [] Hypercoagulable state   [] Anemic Gastrointestinal:  [] Diarrhea   [] Vomiting  [] Gastroesophageal reflux/heartburn   [] Difficulty swallowing. Genitourinary:  [] Chronic kidney disease   [] Difficult urination  [] Frequent urination   [] Blood in urine Skin:  [] Rashes   [] Ulcers  Psychological:  [] History of anxiety   []  History of major depression.  Physical Examination  There were no vitals filed for this visit. There is no height or weight on file to calculate BMI. Gen: WD/WN, NAD Head: [place]/AT, No temporalis wasting.  Ear/Nose/Throat: Hearing grossly intact, nares w/o erythema or drainage Eyes: PER, EOMI, sclera nonicteric.  Neck: Supple, no masses.  No bruit or JVD.  Pulmonary:  Good air movement, no audible wheezing, no use of  accessory muscles.  Cardiac: RRR, normal S1, S2, no Murmurs. Vascular:  carotid bruit noted.  Large varicosities present, greater than 7-8 mm left > right.  Veins are tender to palpation  Mild venous stasis changes to the legs bilaterally.  Trace soft pitting edema CEAP C3sEpAsPr Vessel Right Left  Radial Palpable Palpable  Carotid  Palpable  Palpable  Subclav  Palpable Palpable  Gastrointestinal: soft, non-distended. No guarding/no peritoneal signs.  Musculoskeletal: M/S 5/5 throughout.  No visible deformity.  Neurologic: CN 2-12 intact. Pain and light touch intact in extremities.  Symmetrical.  Speech is fluent. Motor exam as listed above. Psychiatric: Judgment intact, Mood & affect appropriate for pt's clinical situation. Dermatologic: No rashes or ulcers noted.  No changes consistent with cellulitis.   CBC Lab Results  Component Value Date   WBC 7.5 03/03/2024   HGB 12.9 03/03/2024   HCT 38.3 03/03/2024   MCV 94.5 03/03/2024   PLT 313.0 03/03/2024    BMET    Component Value Date/Time   NA 140 03/03/2024 0934   NA 135 02/25/2015 0950   K 3.9 03/03/2024 0934   K 3.8 02/25/2015 0950   CL 104 03/03/2024 0934   CL 97 (L) 02/25/2015 0950   CO2 28 03/03/2024 0934   CO2 29 02/25/2015 0950   GLUCOSE 97 03/03/2024 0934   GLUCOSE 100 (H) 02/25/2015 0950   BUN 20 03/03/2024 0934   BUN 12 02/25/2015 0950   CREATININE 0.88 03/03/2024 0934   CREATININE 0.83 02/25/2015 0950   CALCIUM  9.3 03/03/2024 0934   CALCIUM  8.9 02/25/2015 0950   GFRNONAA >60 04/01/2021 2228   GFRNONAA >60 02/25/2015 0950   GFRAA >60 11/19/2017 1217   GFRAA >60 02/25/2015 0950   CrCl cannot be calculated (Patient's most recent lab result is older than the maximum 21 days allowed.).  COAG Lab Results  Component Value Date   INR 1.0 12/23/2017   INR 1.1 09/21/2014    Radiology No results found.   Assessment/Plan 1. Carotid artery disease, unspecified laterality, unspecified type (HCC)  (Primary) Recommend:  Given the patient's asymptomatic subcritical stenosis no further invasive testing or surgery at this time.  Duplex ultrasound of the carotid arteries obtained today demonstrates RICA 1 to 39% and LICA 40-59% stenosis.  Slight increase on the left compared to the previous study.  Continue antiplatelet therapy as prescribed Continue management of CAD, HTN and Hyperlipidemia Healthy heart diet,  encouraged exercise at least 4 times per week  Follow up in 12 months with duplex ultrasound and physical exam  - VAS US  CAROTID; Future  2. Varicose veins of both lower extremities with pain Recommend:  The patient has had successful ablation of the previously incompetent saphenous venous system but still has persistent symptoms of pain and swelling that are having a negative impact on daily life and daily activities.  Large varicosities present, greater than 7-8 mm left > right.  Veins are tender to palpation  Mild venous stasis changes to the legs bilaterally.  Trace soft pitting edema CEAP C3sEpAsPr  Patient should undergo injection sclerotherapy to treat the residual varicosities.  The risks, benefits and alternative therapies were reviewed in detail with the patient.  All questions were answered.  The patient agrees to proceed with sclerotherapy at their convenience.  The patient will continue wearing the graduated compression stockings and using the over-the-counter pain medications to treat her symptoms.   3. Aortic atherosclerosis (HCC) Recommend:  I do not find evidence of life style limiting vascular disease. The patient specifically denies life style limitation.  Previous noninvasive studies including ABI's of the legs do not identify critical vascular problems.  The patient should continue walking and begin a more formal exercise program. The patient should continue his antiplatelet therapy and aggressive treatment of the lipid abnormalities.  The patient  is instructed to call the office if there is a significant change in the lower extremity symptoms, particularly if a wound develops or there is an abrupt increase in leg pain.  4. Chronic venous insufficiency See # 2  5. Essential hypertension Continue  antihypertensive medications as already ordered, these medications have been reviewed and there are no changes at this time.  6. Coronary artery disease of native artery of native heart with stable angina pectoris (HCC) Continue cardiac and antihypertensive medications as already ordered and reviewed, no changes at this time.  Continue statin as ordered and reviewed, no changes at this time  Nitrates PRN for chest pain    Cordella Shawl, MD  04/05/2024 8:22 AM

## 2024-04-06 ENCOUNTER — Ambulatory Visit (INDEPENDENT_AMBULATORY_CARE_PROVIDER_SITE_OTHER): Payer: Medicare PPO

## 2024-04-06 ENCOUNTER — Ambulatory Visit (INDEPENDENT_AMBULATORY_CARE_PROVIDER_SITE_OTHER): Payer: Medicare PPO | Admitting: Vascular Surgery

## 2024-04-06 VITALS — BP 124/72 | HR 59 | Resp 17 | Ht 61.5 in | Wt 166.0 lb

## 2024-04-06 DIAGNOSIS — I700 Atherosclerosis of aorta: Secondary | ICD-10-CM

## 2024-04-06 DIAGNOSIS — I25118 Atherosclerotic heart disease of native coronary artery with other forms of angina pectoris: Secondary | ICD-10-CM

## 2024-04-06 DIAGNOSIS — I10 Essential (primary) hypertension: Secondary | ICD-10-CM

## 2024-04-06 DIAGNOSIS — I779 Disorder of arteries and arterioles, unspecified: Secondary | ICD-10-CM | POA: Diagnosis not present

## 2024-04-06 DIAGNOSIS — I83813 Varicose veins of bilateral lower extremities with pain: Secondary | ICD-10-CM | POA: Diagnosis not present

## 2024-04-06 DIAGNOSIS — I872 Venous insufficiency (chronic) (peripheral): Secondary | ICD-10-CM | POA: Diagnosis not present

## 2024-04-07 ENCOUNTER — Encounter (INDEPENDENT_AMBULATORY_CARE_PROVIDER_SITE_OTHER): Payer: Self-pay | Admitting: Vascular Surgery

## 2024-04-10 DIAGNOSIS — G4733 Obstructive sleep apnea (adult) (pediatric): Secondary | ICD-10-CM | POA: Diagnosis not present

## 2024-04-10 DIAGNOSIS — H903 Sensorineural hearing loss, bilateral: Secondary | ICD-10-CM | POA: Diagnosis not present

## 2024-04-13 DIAGNOSIS — G4733 Obstructive sleep apnea (adult) (pediatric): Secondary | ICD-10-CM | POA: Diagnosis not present

## 2024-04-18 ENCOUNTER — Telehealth: Payer: Self-pay

## 2024-04-18 NOTE — Telephone Encounter (Signed)
 Copied from CRM [id number]. Topic: Clinical - Medical Advice >> Apr 18, 2024  4:13 PM Baldo Levan wrote: Reason for CRM: Patient called in wanting to let the doctor know her joints are still very painful. She is requesting if Laveta Pottier can call her to explain exactly what it feels like and further explain her questions.

## 2024-04-19 ENCOUNTER — Ambulatory Visit (INDEPENDENT_AMBULATORY_CARE_PROVIDER_SITE_OTHER): Payer: Medicare PPO | Admitting: *Deleted

## 2024-04-19 VITALS — Ht 61.0 in | Wt 166.0 lb

## 2024-04-19 DIAGNOSIS — Z0000 Encounter for general adult medical examination without abnormal findings: Secondary | ICD-10-CM | POA: Diagnosis not present

## 2024-04-19 NOTE — Progress Notes (Signed)
 Subjective:   Morgan Moore is a 68 y.o. who presents for a Medicare Wellness preventive visit.  As a reminder, Annual Wellness Visits don't include a physical exam, and some assessments may be limited, especially if this visit is performed virtually. We may recommend an in-person follow-up visit with your provider if needed.  Visit Complete: Virtual I connected with  Morgan Moore on 04/19/24 by a audio enabled telemedicine application and verified that I am speaking with the correct person using two identifiers.  Patient Location: Home  Provider Location: Home Office  I discussed the limitations of evaluation and management by telemedicine. The patient expressed understanding and agreed to proceed.  Vital Signs: Because this visit was a virtual/telehealth visit, some criteria may be missing or patient reported. Any vitals not documented were not able to be obtained and vitals that have been documented are patient reported.  VideoDeclined- This patient declined Librarian, academic. Therefore the visit was completed with audio only.  Persons Participating in Visit: Patient.  AWV Questionnaire: Yes: Patient Medicare AWV questionnaire was completed by the patient on 04/18/24; I have confirmed that all information answered by patient is correct and no changes since this date.  Cardiac Risk Factors include: advanced age (>52men, >25 women);hypertension;dyslipidemia;obesity (BMI >30kg/m2);Other (see comment), Risk factor comments: CAD     Objective:     Today's Vitals   04/19/24 0812 04/19/24 0813  Weight: 166 lb (75.3 kg)   Height: 5' 1 (1.549 m)   PainSc:  4    Body mass index is 31.37 kg/m.     04/19/2024    8:29 AM 04/19/2023    9:04 AM 04/10/2022   10:44 AM 06/02/2021    2:21 PM 04/08/2021    8:36 AM 04/01/2021   10:22 PM 04/19/2020    6:24 AM  Advanced Directives  Does Patient Have a Medical Advance Directive? No No Yes Yes Yes No  No  Type of Best boy of [place];Living will Healthcare Power of [employer] Power of Attorney    Does patient want to make changes to medical advance directive?   No - Patient declined No - Patient declined No - Patient declined    Copy of Healthcare Power of Attorney in Chart?   No - copy requested  No - copy requested    Would patient like information on creating a medical advance directive? No - Patient declined No - Patient declined     No - Patient declined    Current Medications (verified) Outpatient Encounter Medications as of 04/19/2024  Medication Sig   aspirin  EC 81 MG tablet Take 81 mg by mouth daily.   Cholecalciferol 25 MCG (1000 UT) tablet Take 1,000 Units by mouth 2 (two) times daily.   clopidogrel  (PLAVIX ) 75 MG tablet Take 75 mg by mouth daily.   cyanocobalamin  1000 MCG tablet Take 1,000 mcg by mouth daily at 12 noon.    diphenhydrAMINE (BENADRYL) 12.5 MG/5ML elixir Take 12.5 mg by mouth every 6 (six) hours as needed (allergic reaction).   EPINEPHrine 0.3 mg/0.3 mL IJ SOAJ injection Inject 0.3 mg into the muscle once.    escitalopram  (LEXAPRO ) 10 MG tablet Take 1 tablet (10 mg total) by mouth daily.   Fe Fum-FePoly-Vit C-Vit B3 (INTEGRA) 62.5-62.5-40-3 MG CAPS TAKE ONE CAPSULE BY MOUTH three times WEEKLY.   losartan (COZAAR) 25 MG tablet Take 25 mg by mouth daily.   Melatonin-Pyridoxine 3-10 MG TABS Take 1 tablet by  mouth at bedtime.   mesalamine  (LIALDA ) 1.2 g EC tablet Take 1.2 g by mouth 3 (three) times daily with meals.    metroNIDAZOLE  (METROGEL ) 1 % gel Apply topically.   mupirocin  ointment (BACTROBAN ) 2 % Apply to affected area bid   nitroGLYCERIN  (NITROSTAT ) 0.4 MG SL tablet DISSOLVE (1) TABLET UNDER TONGUE AS NEEDED TO RELIEVE CHEST PAIN. MAYREPEAT EVERY 5 MINUTES.   ondansetron  (ZOFRAN -ODT) 4 MG disintegrating tablet Take 1 tablet (4 mg total) by mouth daily as needed for nausea.   pantoprazole (PROTONIX) 20 MG tablet Take 1  tablet by mouth daily.   prednisoLONE  acetate (PRED FORTE ) 1 % ophthalmic suspension Place 1 drop into both eyes 2 (two) times daily as needed (for inflammation).   REPATHA 140 MG/ML SOSY Inject into the skin.   rosuvastatin  (CRESTOR ) 5 MG tablet TAKE ONE TABLET BY MOUTH TWO TIMES A WEEK   spironolactone (ALDACTONE) 25 MG tablet Take 12.5 mg by mouth.   verapamil  (CALAN -SR) 120 MG CR tablet Take 120 mg by mouth.   No facility-administered encounter medications on file as of 04/19/2024.    Allergies (verified) Ciprofloxacin, Keflex [cephalexin], Lisinopril, Meclizine, Monurol [fosfomycin tromethamine   (obsolete)], Sulfa antibiotics, Tessalon [benzonatate], Influenza virus vaccine, and Codeine   History: Past Medical History:  Diagnosis Date   Allergy    Anemia    Anxiety    Chicken pox    Complication of anesthesia    vomitting   Coronary artery disease    Crohn disease (HCC)    DVT (deep venous thrombosis) (HCC) 2017   Dyspnea    on excertion   Dysrhythmia 2019   palpitations   GERD (gastroesophageal reflux disease)    Heart disease    Heart murmur    Heart murmur    History of kidney stones    Hyperlipidemia    Hypertension    Inflammatory bowel disease    Migraines    hormonal, puberty   Myocardial infarction (HCC)    Phlebitis    PONV (postoperative nausea and vomiting)    Sleep apnea 02/29/2024   Past Surgical History:  Procedure Laterality Date   BREAST BIOPSY Left 07/05/2019   FIBROEPITHELIAL lesion/neg   BREAST CYST ASPIRATION     unsure of side   CARPAL TUNNEL RELEASE Right 12/02/2017   Procedure: CARPAL TUNNEL RELEASE ENDOSCOPIC;  Surgeon: Elner Hahn, MD;  Location: [facility];  Service: Orthopedics;  Laterality: Right;   CORONARY ANGIOPLASTY WITH STENT PLACEMENT  2017   x 2   EYE SURGERY Bilateral    cataract surgery   GANGLION CYST EXCISION Right 12/02/2017   Procedure: REMOVAL GANGLION OF WRIST;  Surgeon: Elner Hahn, MD;  Location: [facility];   Service: Orthopedics;  Laterality: Right;   heart murmur     HYSTEROSCOPY WITH D & C N/A 04/19/2020   Procedure: DILATATION AND CURETTAGE /HYSTEROSCOPY, POSSIBLE  POLYPECTOMY;  Surgeon: Prescilla Brod, MD;  Location: [facility];  Service: Gynecology;  Laterality: N/A;   TONSILLECTOMY AND ADENOIDECTOMY  1962   TUBAL LIGATION  1993   tubaligation  1990   Family History  Problem Relation Age of Onset   Heart disease Father    Heart disease Brother    Cancer Maternal Aunt    Breast cancer Maternal Aunt    Stroke Mother    Kidney disease Neg Hx    GU problems Neg Hx    Kidney cancer Neg Hx    Social History   Socioeconomic History  Marital status: Single    Spouse name: Not on file   Number of children: Not on file   Years of education: Not on file   Highest education level: Master's degree (e.g., MA, MS, MEng, MEd, MSW, MBA)  Occupational History   Not on file  Tobacco Use   Smoking status: Never   Smokeless tobacco: Never  Vaping Use   Vaping status: Never Used  Substance and Sexual Activity   Alcohol use: No    Alcohol/week: 0.0 standard drinks of alcohol   Drug use: No   Sexual activity: Not on file  Other Topics Concern   Not on file  Social History Narrative   Had 2 kids. Daughter died in 05/20/2012 son living    Former Writer    Social Drivers of Health   Financial Resource Strain: Low Risk  (04/18/2024)   Overall Financial Resource Strain (CARDIA)    Difficulty of Paying Living Expenses: Not hard at all  Food Insecurity: No Food Insecurity (04/18/2024)   Hunger Vital Sign    Worried About Running Out of Food in the Last Year: Never true    Ran Out of Food in the Last Year: Never true  Transportation Needs: No Transportation Needs (04/18/2024)   PRAPARE - Administrator, Civil Service (Medical): No    Lack of Transportation (Non-Medical): No  Physical Activity: Sufficiently Active (04/18/2024)   Exercise Vital Sign    Days of Exercise per  Week: 5 days    Minutes of Exercise per Session: 150+ min  Stress: No Stress Concern Present (04/18/2024)   [employer] of Occupational Health - Occupational Stress Questionnaire    Feeling of Stress : Not at all  Social Connections: Moderately Integrated (04/18/2024)   Social Connection and Isolation Panel [NHANES]    Frequency of Communication with Friends and Family: More than three times a week    Frequency of Social Gatherings with Friends and Family: More than three times a week    Attends Religious Services: More than 4 times per year    Active Member of [employer] or Organizations: Yes    Attends Engineer, structural: More than 4 times per year    Marital Status: Divorced    Tobacco Counseling Counseling given: Not Answered    Clinical Intake:  Pre-visit preparation completed: Yes  Pain : 0-10 Pain Score: 4  Pain Type: Chronic pain Pain Location: Knee Pain Orientation: Left Pain Descriptors / Indicators: Aching Pain Onset: More than a month ago Pain Frequency: Intermittent     BMI - recorded: 31.37 Nutritional Status: BMI > 30  Obese Nutritional Risks: None Diabetes: No  No results found for: HGBA1C   How often do you need to have someone help you when you read instructions, pamphlets, or other written materials from your doctor or pharmacy?: 1 - Never  Interpreter Needed?: No  Information entered by :: R. Sargun Rummell LPN   Activities of Daily Living     04/18/2024    4:00 PM  In your present state of health, do you have any difficulty performing the following activities:  Hearing? 0  Vision? 0  Difficulty concentrating or making decisions? 0  Walking or climbing stairs? 0  Dressing or bathing? 0  Doing errands, shopping? 0  Preparing Food and eating ? N  Using the Toilet? N  In the past six months, have you accidently leaked urine? N  Do you have problems with loss of bowel control? N  Managing your Medications? N  Managing your Finances? N   Housekeeping or managing your Housekeeping? N    Patient Care Team: Dellar Fenton, MD as PCP - General (Internal Medicine)  I have updated your Care Teams any recent Medical Services you may have received from other providers in the past year.     Assessment:    This is a routine wellness examination for Morgan Moore.  Hearing/Vision screen Hearing Screening - Comments:: No issues Vision Screening - Comments:: readers   Goals Addressed             This Visit's Progress    Patient Stated       Wants to do more focus on movement and floor exercise       Depression Screen     04/19/2024    8:22 AM 03/07/2024    7:20 AM 11/16/2023    9:45 AM 07/08/2023   10:53 AM 04/19/2023    9:01 AM 03/04/2023    4:33 PM 10/28/2022   10:44 AM  PHQ 2/9 Scores  PHQ - 2 Score 0 0 0 0 0 0 0  PHQ- 9 Score 0 0 0 0 0  0    Fall Risk     04/18/2024    4:00 PM 07/08/2023   10:53 AM 04/19/2023    9:05 AM 03/04/2023    4:32 PM 10/28/2022   10:44 AM  Fall Risk   Falls in the past year? 0 0 0 0 1  Number falls in past yr: 0 0 0 0 0  Injury with Fall? 0 0 0 0 1  Risk for fall due to : No Fall Risks No Fall Risks No Fall Risks No Fall Risks History of fall(s)  Follow up Falls evaluation completed;Falls prevention discussed Falls evaluation completed Falls prevention discussed;Falls evaluation completed Falls evaluation completed Falls evaluation completed    MEDICARE RISK AT HOME:  Medicare Risk at Home Any stairs in or around the home?: (Patient-Rptd) Yes If so, are there any without handrails?: (Patient-Rptd) No Home free of loose throw rugs in walkways, pet beds, electrical cords, etc?: (Patient-Rptd) Yes Adequate lighting in your home to reduce risk of falls?: (Patient-Rptd) Yes Life alert?: (Patient-Rptd) No Use of a cane, walker or w/c?: (Patient-Rptd) No Grab bars in the bathroom?: (Patient-Rptd) No Shower chair or bench in shower?: (Patient-Rptd) No Elevated toilet seat or a handicapped  toilet?: (Patient-Rptd) Yes  TIMED UP AND GO:  Was the test performed?  No  Cognitive Function: 6CIT completed        04/19/2024    8:30 AM 04/19/2023    9:12 AM 04/10/2022   11:04 AM  6CIT Screen  What Year? 0 points 0 points 0 points  What month? 0 points 0 points 0 points  What time? 0 points 0 points 0 points  Count back from 20 0 points 0 points 0 points  Months in reverse 0 points 0 points 0 points  Repeat phrase 0 points 0 points 0 points  Total Score 0 points 0 points 0 points    Immunizations Immunization History  Administered Date(s) Administered   Tdap 05/26/2010    Screening Tests Health Maintenance  Topic Date Due   Medicare Annual Wellness (AWV)  04/18/2024   Pneumonia Vaccine 79+ Years old (1 of 2 - PCV) 03/07/2025 (Originally 02/13/1975)   INFLUENZA VACCINE  06/09/2024   MAMMOGRAM  08/10/2024   Colonoscopy  09/30/2033   DEXA SCAN  Completed   Hepatitis C Screening  Completed   HPV VACCINES  Aged Out   Meningococcal B Vaccine  Aged Out   DTaP/Tdap/Td  Discontinued   COVID-19 Vaccine  Discontinued   Zoster Vaccines- Shingrix  Discontinued    Health Maintenance  Health Maintenance Due  Topic Date Due   Medicare Annual Wellness (AWV)  04/18/2024   Health Maintenance Items Addressed: Patient declines vaccines. Has history of   Additional Screening:  Vision Screening: Recommended annual ophthalmology exams for early detection of glaucoma and other disorders of the eye. Up to date [facility] Would you like a referral to an eye doctor? No    Dental Screening: Recommended annual dental exams for proper oral hygiene  Community Resource Referral / Chronic Care Management: CRR required this visit?  No   CCM required this visit?  No   Plan:    I have personally reviewed and noted the following in the patient's chart:   Medical and social history Use of alcohol, tobacco or illicit drugs  Current medications and supplements including opioid  prescriptions. Patient is not currently taking opioid prescriptions. Functional ability and status Nutritional status Physical activity Advanced directives List of other physicians Hospitalizations, surgeries, and ER visits in previous 12 months Vitals Screenings to include cognitive, depression, and falls Referrals and appointments  In addition, I have reviewed and discussed with patient certain preventive protocols, quality metrics, and best practice recommendations. A written personalized care plan for preventive services as well as general preventive health recommendations were provided to patient.   Felicitas Horse, LPN   [id number]   After Visit Summary: (MyChart) Due to this being a telephonic visit, the after visit summary with patients personalized plan was offered to patient via MyChart   Notes: Nothing significant to report at this time.

## 2024-04-19 NOTE — Patient Instructions (Signed)
 Morgan Moore , Thank you for taking time out of your busy schedule to complete your Annual Wellness Visit with me. I enjoyed our conversation and look forward to speaking with you again next year. I, as well as your care team,  appreciate your ongoing commitment to your health goals. Please review the following plan we discussed and let me know if I can assist you in the future. Your Game plan/ To Do List    Referrals: If you haven't heard from the office you've been referred to, please reach out to them at the phone provided.  None Follow up Visits: Next Medicare AWV with our clinical staff: 04/23/25 @ 8:10   Have you seen your provider in the last 6 months (3 months if uncontrolled diabetes)? Yes Next Office Visit with your provider: 07/11/24  Clinician Recommendations:  Aim for 30 minutes of exercise or brisk walking, 6-8 glasses of water, and 5 servings of fruits and vegetables each day.       This is a list of the screening recommended for you and due dates:  Health Maintenance  Topic Date Due   Pneumonia Vaccine (1 of 2 - PCV) 03/07/2025*   Flu Shot  06/09/2024   Mammogram  08/10/2024   Medicare Annual Wellness Visit  04/19/2025   Colon Cancer Screening  09/30/2028   DEXA scan (bone density measurement)  Completed   Hepatitis C Screening  Completed   HPV Vaccine  Aged Out   Meningitis B Vaccine  Aged Out   DTaP/Tdap/Td vaccine  Discontinued   COVID-19 Vaccine  Discontinued   Zoster (Shingles) Vaccine  Discontinued  *Topic was postponed. The date shown is not the original due date.    Advanced directives: (Declined) Advance directive discussed with you today. Even though you declined this today, please call our office should you change your mind, and we can give you the proper paperwork for you to fill out. Patient will pick up paperwork at next office visit. Advance Care Planning is important because it:  [x]  Makes sure you receive the medical care that is consistent with your  values, goals, and preferences  [x]  It provides guidance to your family and loved ones and reduces their decisional burden about whether or not they are making the right decisions based on your wishes.

## 2024-04-19 NOTE — Telephone Encounter (Signed)
 Please call and get more information. Last visit, she had reported joint pains. Had wanted to hold on further w/up. Also, we discussed her cholesterol medication. Elected to continue. Need to know specific symptoms. Has something changed?

## 2024-04-20 NOTE — Telephone Encounter (Signed)
 FYI- Nothing has changed, still having the joint pain. Discussed her visit with Dr Cheryal Corp about stopping her statin to see if this helps. She had cut down to 3x/wk but is thinking about stopping to see if it helps. I have sent her a my chart message with the info from him per her request. She is going to call back if she needs anything or decides if she wants to be seen sooner than August.

## 2024-05-01 DIAGNOSIS — I2510 Atherosclerotic heart disease of native coronary artery without angina pectoris: Secondary | ICD-10-CM | POA: Diagnosis not present

## 2024-05-01 DIAGNOSIS — I10 Essential (primary) hypertension: Secondary | ICD-10-CM | POA: Diagnosis not present

## 2024-05-01 DIAGNOSIS — G4733 Obstructive sleep apnea (adult) (pediatric): Secondary | ICD-10-CM | POA: Diagnosis not present

## 2024-05-01 DIAGNOSIS — I700 Atherosclerosis of aorta: Secondary | ICD-10-CM | POA: Diagnosis not present

## 2024-05-01 DIAGNOSIS — Z95820 Peripheral vascular angioplasty status with implants and grafts: Secondary | ICD-10-CM | POA: Diagnosis not present

## 2024-05-01 DIAGNOSIS — I471 Supraventricular tachycardia, unspecified: Secondary | ICD-10-CM | POA: Diagnosis not present

## 2024-05-10 ENCOUNTER — Other Ambulatory Visit: Payer: Self-pay | Admitting: Internal Medicine

## 2024-05-13 DIAGNOSIS — G4733 Obstructive sleep apnea (adult) (pediatric): Secondary | ICD-10-CM | POA: Diagnosis not present

## 2024-05-23 DIAGNOSIS — H903 Sensorineural hearing loss, bilateral: Secondary | ICD-10-CM | POA: Diagnosis not present

## 2024-06-02 ENCOUNTER — Encounter: Payer: Self-pay | Admitting: Pharmacist

## 2024-06-02 NOTE — Progress Notes (Signed)
 Pharmacy Quality Measure Review  This patient is appearing on a report for being at risk of failing the adherence measure for cholesterol (statin) medications this calendar year.   Medication: Rosuvastatin  5 mg Last fill date: 12/31/23 for 28 day supply  Prescribed instruction: 1 tablet two days per week.  Dispensed 12/31/23: 36 tablets (billed for 90 day supply). 36 tablets would actually be an 18-week supply = 126 day supply

## 2024-06-02 NOTE — Progress Notes (Signed)
 Pt does not need appt with us . Is seeing Dr. Kurian Kasa for sleep management.

## 2024-06-05 ENCOUNTER — Ambulatory Visit: Payer: Self-pay | Admitting: Internal Medicine

## 2024-06-05 DIAGNOSIS — Z91199 Patient's noncompliance with other medical treatment and regimen due to unspecified reason: Secondary | ICD-10-CM

## 2024-06-06 DIAGNOSIS — M25561 Pain in right knee: Secondary | ICD-10-CM | POA: Diagnosis not present

## 2024-06-06 DIAGNOSIS — M25562 Pain in left knee: Secondary | ICD-10-CM | POA: Diagnosis not present

## 2024-06-06 DIAGNOSIS — M2392 Unspecified internal derangement of left knee: Secondary | ICD-10-CM | POA: Diagnosis not present

## 2024-06-13 DIAGNOSIS — G4733 Obstructive sleep apnea (adult) (pediatric): Secondary | ICD-10-CM | POA: Diagnosis not present

## 2024-06-19 NOTE — H&P (Signed)
 ORTHOPAEDIC HISTORY & PHYSICAL Karys Meckley, Lynwood Idalia Raddle., MD - 06/06/2024 3:15 PM EDT Formatting of this note is different from the original. Images from the original note were not included. Chief Complaint: Chief Complaint Patient presents with Left Knee - Pain Right Knee - Pain  Reason for Visit: The patient is a 68 y.o. female who presents today for evaluation of both knees. She initially injured the left knee in November 2023 when she stepped I a hole in her yard and hyperextended the left knee. She was evaluated at [facility] and subsequently by Dr. Addie Birmingham. Her treatment included initial knee immobilizer, hinged knee brace, intraarticular corticosteroid injection, and physical therapy. She made good progress and was able to resume her regular activities. Over the last 2 months she has experienced progressive increase in her left knee pain with some swelling, catching, and stiffness. She localizes most of the pain along the medial, lateral, and anterior aspect of the knee. The pain is aggravated by lateral movements, pivoting, squatting, and walking. She reports some lesser right knee pain that she believes may be related to compensating for the left knee pain. She denies any gross right knee swelling, locking, or giving way.  Medications: Current Outpatient Medications Medication Sig Dispense Refill aspirin  81 MG EC tablet Take 1 tablet (81 mg total) by mouth every morning 0 cholecalciferol (VITAMIN D3) 1000 unit tablet Take 2,000 Units by mouth once daily clopidogreL  (PLAVIX ) 75 mg tablet TAKE ONE TABLET BY MOUTH ONCE DAILY 90 tablet 3 cyanocobalamin  (VITAMIN B12) 1000 MCG tablet Take 1,000 mcg by mouth once daily diphenhydrAMINE (BENADRYL) 12.5 mg/5 mL solution Take 12.5 mg by mouth every 6 (six) hours as needed for Allergies.  EPINEPHrine (EPIPEN) 0.3 mg/0.3 mL pen injector Inject 0.3 mg into the muscle once as needed for Anaphylaxis.  escitalopram  oxalate (LEXAPRO ) 20 MG  tablet Take 1 tablet (20 mg total) by mouth at bedtime 90 tablet 0 INTEGRA 125-40-3 mg Cap Take 1 capsule by mouth 3 (three) times a week Tues, thurs - patient reports taking twice a week KETOTIFEN FUMARATE (ZYRTEC ITCHY EYE DROPS, KETO, OPHTH) Apply 1 drop to eye once daily as needed (itchy eyes) losartan (COZAAR) 25 MG tablet Take 1 tablet (25 mg total) by mouth 2 (two) times daily 180 tablet 3 melatonin-pyridoxine HCl, B6, 3-10 mg Tab Take 1 tablet by mouth every evening mesalamine  (LIALDA ) 1.2 gram EC tablet Take 4 tablets (4.8 g total) by mouth once daily 360 tablet 3 metroNIDAZOLE  (METROGEL ) 1 % gel Apply topically as needed 45 g 5 nitroGLYcerin  (NITROSTAT ) 0.4 MG SL tablet DISSOLVE (1) TABLET UNDER TONGUE AS NEEDED TO RELIEVE CHEST PAIN. MAYREPEAT EVERY 5 MINUTES. 25 tablet 0 pantoprazole (PROTONIX) 20 MG DR tablet TAKE 1 TABLET BY MOUTH ONCE DAILY. 90 tablet 3 prednisoLONE  acetate (PRED FORTE ) 1 % ophthalmic suspension Place 1 drop into both eyes as needed REPATHA SYRINGE 140 mg/mL Syrg INJECT 140MG  SUBCUTANEOUSLY EVERY 14 DAYS 2 mL 11 rosuvastatin  (CRESTOR ) 5 MG tablet Take 5 mg by mouth once daily Twice a week spironolactone (ALDACTONE) 25 MG tablet Take 0.5 tablets (12.5 mg total) by mouth once daily 15 tablet 11 verapamiL  (CALAN ) 40 MG immediate release tablet Take 1 tablet (40 mg total) by mouth every 6 (six) hours as needed Take PRN if systolic BP is over 140 (Patient taking differently: Take 40 mg by mouth) 180 tablet 3 verapamiL  (CALAN -SR) 120 MG SR tablet Take 1 tablet (120 mg total) by mouth once daily 90 tablet 3  verapamiL  (VERELAN ) 120 MG SR capsule Take 1 capsule (120 mg total) by mouth at bedtime 90 capsule 3  No current facility-administered medications for this visit.  Allergies: Allergies Allergen Reactions Benzonatate Anaphylaxis Keflex [Cephalexin] Anaphylaxis Lisinopril Anaphylaxis Meclizine Swelling Tongue and throat swelling Sulfamethoxazole-Trimethoprim  Anaphylaxis Ciprofloxacin Hcl Swelling mild to moderate swelling of the lips and legs Fosfomycin Tromethamine  Rash Splotchy rash all over body. Breathing became difficult/tightened. Took benadryl immediately so she did not swell  Codeine Nausea  Past Medical History: Past Medical History: Diagnosis Date Acute myocardial infarction of anterolateral wall, initial episode of care (CMS/HHS-HCC) Anemia Arrhythmia Arthritis CATARACT Contusion of right knee 09/28/2022 Coronary artery disease Episcleritis Erythema nodosum GERD (gastroesophageal reflux disease) Grief at loss of child 2011 H/O supraventricular tachycardia Heart murmur History of Crohn's disease diagnosed 1999 History of DVT (deep vein thrombosis) with recurrent thrombophlebitis associated with pregnancy Hypertension Migraines Motion sickness OSTEOPOROSIS history of compression fractures Other and unspecified hyperlipidemia 05/29/2011 P-ANCA and MPO antibodies positive Phosphate kidney stone PONV (postoperative nausea and vomiting) Shoulder fracture  Past Surgical History: Past Surgical History: Procedure Laterality Date CATARACT EXTRACTION 2013 left eye COLONOSCOPY W/BIOPSY 04/28/2013 Procedure: COLONOSCOPY W/BIOPSY; Surgeon: Inocente Earnie Hausen, MD; Location: [facility]; Service: Gastroenterology;; CORI W/BIOPSY N/A 04/26/2015 Procedure: EGD ; Surgeon: Inocente Earnie Hausen, MD; Location: [facility]; Service: Gastroenterology; Laterality: N/A; possible dilation Endoscopic right carpal tunnel release, flexor tenosynovectomy,right wrist Right 12/02/2017 Dr.Poggi COLONOSCOPY W/BIOPSY N/A 08/26/2018 Procedure: Colonoscopy; Surgeon: Hausen Inocente Earnie, MD; Location: [facility]; Service: Gastroenterology; Laterality: N/A; EXCISION RECTAL TUMOR TRANSANAL N/A 10/31/2018 Procedure: EXCISION OF RECTAL TUMOR, TRANSANAL APPROACH; INCLUDING MUSCULARIS PROPRIA  (IE, FULL THICKNESS); Surgeon: Frederik Mliss Shawnee Isom, MD; [facility]; Service: General Surgery; Laterality: N/A; ANORECTAL EXAM N/A 10/31/2018 Procedure: ANORECTAL EXAM; Surgeon: Frederik Mliss Shawnee Isom, MD; Location[facility]; Service: General Surgery; Laterality: N/A; BREAST BIOPSY 06/2019 x2 COLONOSCOPY W/BIOPSY N/A 10/20/2019 Procedure: SURVEILLANCE IBD COLONOSCOPY; Surgeon: Hausen Inocente Earnie, MD; Location: [facility]; Service: Gastroenterology; Laterality: N/A; COLONOSCOPY W/BIOPSY N/A 10/01/2023 Procedure: Surveillance IBD Colonoscopy; Surgeon: Hausen Inocente Earnie, MD; Location: [facility]; Service: Gastroenterology; Laterality: N/A; ESOPHAGOGASTRODOUDENOSCOPY W/BIOPSY N/A 10/01/2023 Procedure: EGD - Upper Endoscopy; Surgeon: Hausen Inocente Earnie, MD; Location: [facility]; Service: Gastroenterology; Laterality: N/A; CARDIAC CATHETERIZATION cardiac stents DILATION AND CURETTAGE OF UTERUS HYSTEROSCOPY polypectomy TONSILLECTOMY TUBAL LIGATION  Social History: Social History  Socioeconomic History Marital status: Divorced Number of children: 1 Years of education: 18 Highest education level: Master's degree (e.g., MA, MS, MEng, MEd, MSW, MBA) Occupational History Occupation: Retired Runner, broadcasting/film/video Tobacco Use Smoking status: Never Smokeless tobacco: Never Vaping Use Vaping status: Never Used Substance and Sexual Activity Alcohol use: Not Currently Drug use: No Sexual activity: Not Currently Partners: Male Birth control/protection: Surgical Social History Narrative Lilyanah is previously divorced and her ex-husband is deceased. She has 2 children and her daughter is deceased as well. She continues to experience difficulty coping with her daughter's death. She is currently employed as a Therapist, nutritional.  Social Drivers of Catering manager Strain: Low Risk (04/18/2024) Received from [facility] Overall Financial  Resource Strain (CARDIA) Difficulty of Paying Living Expenses: Not hard at all Food Insecurity: No Food Insecurity (04/18/2024) Received from [facility] Hunger Vital Sign Within the past 12 months, you worried that your food would run out before you got the money to buy more.: Never true Within the past 12 months, the food you bought just didn't last and you didn't have money to get more.: Never  true Transportation Needs: No Transportation Needs (04/18/2024) Received from [facility] - Transportation Lack of Transportation (Medical): No Lack of Transportation (Non-Medical): No Physical Activity: Sufficiently Active (04/18/2024) Received from [facility] Exercise Vital Sign On average, how many days per week do you engage in moderate to strenuous exercise (like a brisk walk)?: 5 days On average, how many minutes do you engage in exercise at this level?: 150+ min Stress: No Stress Concern Present (04/18/2024) Received from [facility] of Occupational Health - Occupational Stress Questionnaire Feeling of Stress : Not at all Social Connections: Moderately Integrated (04/18/2024) Received from [facility] Social Connection and Isolation Panel In a typical week, how many times do you talk on the phone with family, friends, or neighbors?: More than three times a week How often do you get together with friends or relatives?: More than three times a week How often do you attend church or religious services?: More than 4 times per year Do you belong to any clubs or organizations such as church groups, unions, fraternal or athletic groups, or school groups?: Yes How often do you attend meetings of the clubs or organizations you belong to?: More than 4 times per year Are you married, widowed, divorced, separated, never married, or living with a partner?: Divorced Housing Stability: Unknown (12/06/2023) Housing Stability Vital Sign Homeless in the Last Year: No  Family  History: Family History Problem Relation Name Age of Onset Stroke Mother High blood pressure (Hypertension) Mother Heart disease Father Melanoma Sister Coronary Artery Disease (Blocked arteries around heart) Brother Heart disease Brother Coronary Artery Disease (Blocked arteries around heart) Other nephew Anesthesia problems Neg Hx  Review of Systems: A comprehensive 14 point ROS was performed, reviewed, and the pertinent orthopaedic findings are documented in the HPI.  Exam BP 122/78  Ht 154.9 cm (5' 1)  Wt 76 kg (167 lb 9.6 oz)  BMI 31.67 kg/m  General: Well-developed, well-nourished female seen in no acute distress. Even heel to toe gait. No varus or valgus thrust to either knee.  HEENT: Atraumatic, normocephalic. Pupils are equal and reactive to light. Extraocular motion is intact. Sclera are clear. Oropharynx is clear with moist mucosa.  Lungs: Clear to auscultation bilaterally.  Cardiovascular: Regular rate and rhythm. Normal S1, S2. No murmur . No appreciable gallops or rubs. Peripheral pulses are palpable. No lower extremity edema. Homan`s test is negative.  Extremities: Good strength, stability, and range of motion of the upper extremities. Good range of motion of the hips and ankles.  Left Knee: Soft tissue swelling: moderate Effusion: minimal Erythema: none Crepitance: minimal Tenderness: lateral Alignment: normal Mediolateral laxity: stable Anterior drawer test:negative Lachman`s test: negative McMurray`s test: positive Atrophy: No significant atrophy. Quadriceps tone was fair to good. Range of Motion: 0/0/104 degrees  Right Knee: Soft tissue swelling: none Effusion: none Erythema: none Crepitance: none Tenderness: No focal tenderness Alignment: normal Mediolateral laxity: stable Anterior drawer test:negative Lachman`s test: negative McMurray`s test: negative Atrophy: No significant atrophy. Quadriceps tone was fair to good. Range of  Motion: greater than 120 degrees  Neurologic: Awake, alert, and oriented. Sensory function is intact to pinprick and light touch. Motor strength is judged to be 5/5. Motor coordination is within normal limits. No apparent clonus. No tremor.  Radiographs: I ordered and interpreted standing AP, lateral, and sunrise radiographs of the right knee that were obtained in the office today. Good preservation of the cartilage space. No chondrocalcinosis is appreciated. No significant osteophyte formation or subchondral sclerosis. No evidence  of fracture or dislocation.  I ordered and interpreted standing AP, lateral, and sunrise radiographs of the left knee that were obtained in the office today. Good preservation of the cartilage space. Chondrocalcinosis is appreciated. No significant osteophyte formation or subchondral sclerosis. No evidence of fracture or dislocation.  MRI: I reviewed the left knee MRI from [facility] dated 09/15/2022. I concur with the radiologist's interpretation as below:  MRI of the Left Knee  INDICATION: Left knee trauma. Pain, status post hyperextension injury on 09/11/2022. Swelling and decreased range of motion.  TECHNIQUE: High resolution MR images of the knee were obtained in multiple planes emphasizing relative T1, proton density, and T2 contrast.  CONTRAST DOSE: None.  COMPARISON: 09/14/2022 left knee x-ray.  FINDINGS:  Cruciate Ligaments: Intact Collateral Ligaments: Minimal signal changes in an otherwise intact medial collateral ligament on series 9 image 12 on the femoral side, and to a lesser degree at the femoral attachment of lateral collateral ligament on coronal series 9 image 7, suggesting minimal sprain. Bones, Articular (Hyaline) Cartilage: Surface erosions and loss of articular cartilage signal in portions of the upper medial femoral condyle articular facet. Foci of low T1 and high T2 signal compatible with bony contusion and probable osteochondral  injury with subtle alterations of cortical signal on image 4:13 and image 8:18, 0.7 cm in height. On axial images, 2 adjacent foci are noted with combined measurements of 1.2 x 2.5 cm on image 4:12. Additional surface erosions in portions of the femoral trochlea cartilage. Cartilage signal elsewhere is grossly unremarkable for the patient's age. Normal-appearing marrow signal elsewhere. Menisci: Myxoid degeneration and tear in the posterior horn lateral meniscus extending to the tibial articular surface on sagittal series 7 images 13 and 14. Myxoid degeneration in the anterior horn lateral meniscus without definite evidence of focal tear. Synovium: Moderate joint effusion. No large popliteal cyst seen.  Tendons: Mild tendinosis in the distal quadriceps tendon. Minimal signal changes at the patellar origin of the patellar tendon. Nonspecific mild thickening of the posterior joint capsule on sagittal series 7 image 21.  Increased signal compatible with sprain in the patellar retinaculum, more on the lateral than medial side.  IMPRESSION: 1. Moderate joint effusion. 2. Mild sprain of uncertain age in the collateral ligaments, more on the medial than lateral side. No full-thickness tear seen. 3. Myxoid degeneration in portions of the menisci. Posterior horn medial meniscal tear. 4. Degenerative arthritic changes and probable osteochondral injury in the upper pole medial patellar articular facet. Erosions and articular cartilages thinning/loss in portions of the medial patellar articular facet and adjacent femoral trochlea. 5. Signal changes compatible with sprain involving the patellar retinaculum. Nonspecific, subjectively mild increased thickening of the posterior joint capsule.  Electronically Signed by: Prentice Hoe, MD, [facility] Radiology Electronically Signed on: 09/15/2022 9:12 AM  Impression: Internal derangement of the left knee Right knee pain  Plan: The findings were  discussed in detail with the patient. The patient was given informational material on knee arthroscopy. Conservative treatment options were reviewed with the patient. We discussed the risks and benefits of surgical intervention. Arthroscopy is an appropriate treatment for the meniscal pathology, but would have limited or no effect on degenerative changes of the articular cartilage. The usual perioperative course was also discussed in detail. The patient expressed understanding of the risks and benefits of surgical intervention and would like to proceed with plans for left knee arthroscopy.  I spent a total of 45 minutes in both face-to-face and non-face-to-face activities, excluding procedures performed, for  this visit on the date of this encounter.  MEDICAL CLEARANCE: Per anesthesiology. ACTIVITIES: Avoid pivoting, squatting, or twisting. WORK STATUS: Not applicable. THERAPY: Quadriceps strengthening exercises. MEDICATIONS: Requested Prescriptions  No prescriptions requested or ordered in this encounter  FOLLOW-UP: Return for postoperative follow-up.  Anzlee Hinesley P. Geary Rufo, Jr., M.D.

## 2024-06-19 NOTE — Discharge Instructions (Signed)
  Instructions after Knee Arthroscopy    Akeel Reffner P. Erina Hamme, Jr., M.D.    Dept. of Orthopaedics & Sports Medicine [facility] [address] [place], [place]  [address]  Phone: [phone]   Fax: [phone]       [email or web address]       DIET: Drink plenty of non-alcoholic fluids & begin a light diet. Resume your normal diet the day after surgery.  ACTIVITY:  You may use crutches or a walker with weight-bearing as tolerated, unless instructed otherwise. You may wean yourself off of the walker or crutches as tolerated.  Begin doing gentle exercises. Exercising will reduce the pain and swelling, increase motion, and prevent muscle weakness.   Avoid strenuous activities or athletics for a minimum of 4-6 weeks after arthroscopic surgery. Do not drive or operate any equipment until instructed.  WOUND CARE:  Place one to two pillows under the knee the first day or two when sitting or lying.  Continue to use the ice packs periodically to reduce pain and swelling. The small incisions in your knee are closed with nylon stitches. The stitches will be removed in the office. The bulky dressing may be removed on the second day after surgery. DO NOT TOUCH THE STITCHES. Put a Band-Aid over each stitch. Do NOT use any ointments or creams on the incisions.  You may bathe or shower after the stitches are removed at the first office visit following surgery.  MEDICATIONS: You may resume your regular medications. Please take the pain medication as prescribed. Do not take pain medication on an empty stomach. Do not drive or drink alcoholic beverages when taking pain medications.  CALL THE OFFICE FOR: Temperature above 101 degrees Excessive bleeding or drainage on the dressing. Excessive swelling, coldness, or paleness of the toes. Persistent nausea and vomiting.  FOLLOW-UP:  You should have an appointment to return to the office in 7-10 days after surgery.       [facility] Department Directory         [email or web address]       [email or web address]          Cardiology  Appointments: [place] - [phone]  Endocrinology  Appointments: [place] [phone] [place] - [phone]  Gastroenterology  Appointments: [place] [phone] [place] - [phone]        General Surgery   Appointments: [facility]  Internal Medicine/Family Medicine  Appointments: [facility] [place] - [phone] [place] - [phone]  Metabolic and Weigh Loss Surgery  Appointments: [facility]        Neurology  Appointments: [place] [phone] [place] - [phone]  Neurosurgery  Appointments: [place]  Obstetrics & Gynecology  Appointments: [place] [phone] [place] - [phone]        Pediatrics  Appointments: [place] [phone] [place] - [phone]  Physiatry  Appointments: [place] [phone]  Physical Therapy  Appointments: [place] - [phone]        Podiatry  Appointments: [place] [phone] [place] - [phone]  Pulmonology  Appointments: [place]  Rheumatology  Appointments: [place] [phone]        [place] Location: [facility]  [address] [place], [place]  [address]  [place] Location: [facility] [address] [place], [place]  [address]  [place] Location: [facility] [address] [place], [place]  [address]

## 2024-06-22 ENCOUNTER — Ambulatory Visit (INDEPENDENT_AMBULATORY_CARE_PROVIDER_SITE_OTHER): Admitting: Nurse Practitioner

## 2024-06-22 ENCOUNTER — Encounter (INDEPENDENT_AMBULATORY_CARE_PROVIDER_SITE_OTHER): Payer: Self-pay | Admitting: Nurse Practitioner

## 2024-06-22 VITALS — BP 121/69 | HR 82 | Resp 18 | Wt 170.8 lb

## 2024-06-22 DIAGNOSIS — I83819 Varicose veins of unspecified lower extremities with pain: Secondary | ICD-10-CM

## 2024-06-23 ENCOUNTER — Ambulatory Visit: Admitting: Internal Medicine

## 2024-06-23 ENCOUNTER — Encounter
Admission: RE | Admit: 2024-06-23 | Discharge: 2024-06-23 | Disposition: A | Discharge status: Home / Self Care | Source: Ambulatory Visit | Attending: Orthopedic Surgery | Admitting: Orthopedic Surgery

## 2024-06-23 ENCOUNTER — Encounter: Payer: Self-pay | Admitting: Internal Medicine

## 2024-06-23 ENCOUNTER — Other Ambulatory Visit: Payer: Self-pay

## 2024-06-23 VITALS — BP 130/80 | HR 67 | Temp 98.6°F | Ht 61.0 in | Wt 170.2 lb

## 2024-06-23 DIAGNOSIS — Z01818 Encounter for other preprocedural examination: Secondary | ICD-10-CM | POA: Insufficient documentation

## 2024-06-23 DIAGNOSIS — M2390 Unspecified internal derangement of unspecified knee: Secondary | ICD-10-CM

## 2024-06-23 DIAGNOSIS — I10 Essential (primary) hypertension: Secondary | ICD-10-CM

## 2024-06-23 DIAGNOSIS — G4733 Obstructive sleep apnea (adult) (pediatric): Secondary | ICD-10-CM | POA: Diagnosis not present

## 2024-06-23 DIAGNOSIS — Z01810 Encounter for preprocedural cardiovascular examination: Secondary | ICD-10-CM

## 2024-06-23 DIAGNOSIS — Z01812 Encounter for preprocedural laboratory examination: Secondary | ICD-10-CM

## 2024-06-23 DIAGNOSIS — I214 Non-ST elevation (NSTEMI) myocardial infarction: Secondary | ICD-10-CM | POA: Diagnosis not present

## 2024-06-23 DIAGNOSIS — I779 Disorder of arteries and arterioles, unspecified: Secondary | ICD-10-CM | POA: Diagnosis not present

## 2024-06-23 DIAGNOSIS — I700 Atherosclerosis of aorta: Secondary | ICD-10-CM | POA: Insufficient documentation

## 2024-06-23 HISTORY — DX: Non-ST elevation (NSTEMI) myocardial infarction: I21.4

## 2024-06-23 HISTORY — DX: Atherosclerosis of aorta: I70.0

## 2024-06-23 HISTORY — DX: Depression, unspecified: F32.A

## 2024-06-23 HISTORY — DX: Supraventricular tachycardia, unspecified: I47.10

## 2024-06-23 HISTORY — DX: Unspecified internal derangement of left knee: M23.92

## 2024-06-23 HISTORY — DX: Long term (current) use of antithrombotics/antiplatelets: Z79.02

## 2024-06-23 HISTORY — DX: Obstructive sleep apnea (adult) (pediatric): G47.33

## 2024-06-23 HISTORY — DX: Presence of coronary angioplasty implant and graft: Z95.5

## 2024-06-23 LAB — BASIC METABOLIC PANEL WITH GFR
Anion gap: 10 (ref 5–15)
BUN: 21 mg/dL (ref 8–23)
CO2: 24 mmol/L (ref 22–32)
Calcium: 9.8 mg/dL (ref 8.9–10.3)
Chloride: 106 mmol/L (ref 98–111)
Creatinine, Ser: 1 mg/dL (ref 0.44–1.00)
GFR, Estimated: >60 mL/min
Glucose, Bld: 119 mg/dL — ABNORMAL HIGH (ref 70–99)
Potassium: 3.9 mmol/L (ref 3.5–5.1)
Sodium: 140 mmol/L (ref 135–145)

## 2024-06-23 LAB — CBC
HCT: 36 % (ref 36.0–46.0)
Hemoglobin: 12.4 g/dL (ref 12.0–15.0)
MCH: 31.6 pg (ref 26.0–34.0)
MCHC: 34.4 g/dL (ref 30.0–36.0)
MCV: 91.8 fL (ref 80.0–100.0)
Platelets: 293 K/uL (ref 150–400)
RBC: 3.92 MIL/uL (ref 3.87–5.11)
RDW: 14 % (ref 11.5–15.5)
WBC: 8.1 K/uL (ref 4.0–10.5)
nRBC: 0 % (ref 0.0–0.2)

## 2024-06-23 NOTE — Progress Notes (Signed)
 Name: Morgan Moore MRN: [id number] DOB: Jun 06, 1956      SPLIT NIGHT SLEEP STUDY Mod/Severe sleep apnea AHI 24 REM AHI 80 Supine AHI 33 O2 saturations less than 88% 58 minutes CPAP titration study to 11 cm water pressure controlled OSA   CHIEF COMPLAINT:  Follow-up assessment for OSA   HISTORY OF PRESENT ILLNESS: Discussed sleep data and reviewed with patient.  Split-night sleep study was reviewed in detail with patient CPAP 11 was best therapy for OSA   No exacerbation at this time No evidence of heart failure at this time No evidence or signs of infection at this time No respiratory distress No fevers, chills, nausea, vomiting, diarrhea No evidence of lower extremity edema No evidence hemoptysis   CARDIOVASCULAR ASSESSMENT Carotid artery disease, unspecified laterality, unspecified type (HCC) Continue crestor  and aspirin .  Saw Dr Jama 04/2022 - Given the patient's asymptomatic subcritical stenosis no further invasive testing or surgery recommended.  Duplex ultrasound shows 1-39% stenosis bilaterally (04/08/23). Continue antiplatelet therapy as prescribed Continue management of CAD, HTN and Hyperlipidemia.   Coronary artery disease of native artery of native heart with stable angina pectoris (HCC) Continues Continue risk factor modification.  Seeing cardiology.  3 MI's in the past has 5 stents CBC    Component Value Date/Time   WBC 7.5 03/03/2024 0934   RBC 4.05 03/03/2024 0934   HGB 12.9 03/03/2024 0934   HGB 13.8 02/25/2015 0950   HCT 38.3 03/03/2024 0934   HCT 40.4 02/25/2015 0950   PLT 313.0 03/03/2024 0934   PLT 192 02/25/2015 0950   MCV 94.5 03/03/2024 0934   MCV 90 02/25/2015 0950   MCH 32.9 04/01/2021 2228   MCHC 33.8 03/03/2024 0934   RDW 13.8 03/03/2024 0934   RDW 14.0 02/25/2015 0950   LYMPHSABS 1.5 03/03/2024 0934   LYMPHSABS 1.3 02/25/2015 0950   MONOABS 0.7 03/03/2024 0934   MONOABS 1.5 (H) 02/25/2015 0950   EOSABS 0.3 03/03/2024  0934   EOSABS 0.1 02/25/2015 0950   BASOSABS 0.1 03/03/2024 0934   BASOSABS 0.0 02/25/2015 0950       Latest Ref Rng & Units 03/03/2024    9:34 AM 11/12/2023    9:03 AM 06/30/2023   10:20 AM  BMP  Glucose 70 - 99 mg/dL 97  94  899   BUN 6 - 23 mg/dL 20  20  17    Creatinine 0.40 - 1.20 mg/dL 9.11  9.16  9.17   Sodium 135 - 145 mEq/L 140  140  141   Potassium 3.5 - 5.1 mEq/L 3.9  3.7  3.9   Chloride 96 - 112 mEq/L 104  103  106   CO2 19 - 32 mEq/L 28  28  28    Calcium  8.4 - 10.5 mg/dL 9.3  9.3  9.2      PAST MEDICAL HISTORY :   has a past medical history of Allergy, Anemia, Anxiety, Chicken pox, Complication of anesthesia, Coronary artery disease, Crohn disease (HCC), DVT (deep venous thrombosis) (HCC) (2017), Dyspnea, Dysrhythmia (2019), GERD (gastroesophageal reflux disease), Heart disease, Heart murmur, Heart murmur, History of kidney stones, Hyperlipidemia, Hypertension, Inflammatory bowel disease, Migraines, Myocardial infarction (HCC), Phlebitis, PONV (postoperative nausea and vomiting), and Sleep apnea (02/29/2024).  has a past surgical history that includes Tonsillectomy and adenoidectomy (1962); tubaligation (1990); heart murmur; Tubal ligation (1993); Eye surgery (Bilateral); Coronary angioplasty with stent (2017); Breast cyst aspiration; Carpal tunnel release (Right, 12/02/2017); Ganglion cyst excision (Right, 12/02/2017); Hysteroscopy with D & C (N/A,  04/19/2020); and Breast biopsy (Left, 07/05/2019). Prior to Admission medications   Medication Sig Start Date End Date Taking? Authorizing Provider  aspirin  EC 81 MG tablet Take 81 mg by mouth daily.    [provider]  Cholecalciferol 25 MCG (1000 UT) tablet Take 1,000 Units by mouth 2 (two) times daily.    [provider]  clopidogrel  (PLAVIX ) 75 MG tablet Take 75 mg by mouth daily.    [provider]  cyanocobalamin  1000 MCG tablet Take 1,000 mcg by mouth daily at 12 noon.     [provider]   diphenhydrAMINE (BENADRYL) 12.5 MG/5ML elixir Take 12.5 mg by mouth every 6 (six) hours as needed (allergic reaction).    [provider]  EPINEPHrine  0.3 mg/0.3 mL IJ SOAJ injection Inject 0.3 mg into the muscle once.  12/18/13   [provider]  escitalopram  (LEXAPRO ) 20 MG tablet TAKE ONE TABLET BY MOUTH ONCE DAILY 09/23/23   Glendia Shad, MD  Fe Fum-FePoly-Vit C-Vit B3 (INTEGRA) 62.5-62.5-40-3 MG CAPS TAKE ONE CAPSULE BY MOUTH three times WEEKLY. 10/05/22   Glendia Shad, MD  fluocinonide (LIDEX) 0.05 % external solution Apply 1 Application topically 2 (two) times daily. 03/26/23 03/25/24  [provider]  losartan (COZAAR) 25 MG tablet Take 25 mg by mouth daily.    [provider]  Melatonin-Pyridoxine 3-10 MG TABS Take 1 tablet by mouth at bedtime.    [provider]  mesalamine  (LIALDA ) 1.2 g EC tablet Take 1.2 g by mouth 3 (three) times daily with meals.     [provider]  mupirocin  ointment (BACTROBAN ) 2 % Apply to affected area bid 02/10/22   Glendia Shad, MD  nitroGLYCERIN  (NITROSTAT ) 0.4 MG SL tablet DISSOLVE (1) TABLET UNDER TONGUE AS NEEDED TO RELIEVE CHEST PAIN. MAYREPEAT EVERY 5 MINUTES. 12/09/20   Glendia Shad, MD  ondansetron  (ZOFRAN -ODT) 4 MG disintegrating tablet Take 1 tablet (4 mg total) by mouth daily as needed for nausea. 10/09/21   Glendia Shad, MD  pantoprazole (PROTONIX) 20 MG tablet Take 1 tablet by mouth daily. 01/18/23   [provider]  prednisoLONE  acetate (PRED FORTE ) 1 % ophthalmic suspension Place 1 drop into both eyes 2 (two) times daily as needed (for inflammation).    [provider]  REPATHA 140 MG/ML SOSY Inject into the skin. 01/20/22   [provider]  rosuvastatin  (CRESTOR ) 5 MG tablet TAKE ONE TABLET BY MOUTH THREE TIMES A WEEK 09/15/23   Glendia Shad, MD  verapamil  (CALAN ) 40 MG tablet Take 40 mg by mouth every 6 (six) hours as needed.    [provider]   verapamil  (VERELAN ) 180 MG 24 hr capsule Take 180 mg by mouth daily. 11/12/23   [provider]   Allergies  Allergen Reactions   Ciprofloxacin Anaphylaxis   Keflex [Cephalexin] Anaphylaxis   Lisinopril Anaphylaxis   Meclizine Anaphylaxis   Monurol [Fosfomycin Tromethamine   (Obsolete)] Rash    Splotchy rash all over body. Breathing became difficult/tightened. Took benadryl immediately so she did not swell    Sulfa Antibiotics Anaphylaxis   Tessalon [Benzonatate] Anaphylaxis   Influenza Virus Vaccine Other (See Comments)    History of Epstein Barr   Codeine Nausea And Vomiting    FAMILY HISTORY:  family history includes Breast cancer in her maternal aunt; Cancer in her maternal aunt; Heart disease in her brother and father; Stroke in her mother. SOCIAL HISTORY:  reports that she has never smoked. She has never used smokeless tobacco. She reports  that she does not drink alcohol and does not use drugs.    BP 130/80 (BP Location: Right Arm, Patient Position: Sitting, Cuff Size: Normal)   Pulse 67   Temp 98.6 F (37 C) (Oral)   Ht 5' 1 (1.549 m)   Wt 170 lb 3.2 oz (77.2 kg)   SpO2 98%   BMI 32.16 kg/m       Review of Systems: Gen:  Denies  fever, sweats, chills weight loss  HEENT: Denies blurred vision, double vision, ear pain, eye pain, hearing loss, nose bleeds, sore throat Cardiac:  No dizziness, chest pain or heaviness, chest tightness,edema, No JVD Resp:   No cough, -sputum production, -shortness of breath,-wheezing, -hemoptysis,  Other:  All other systems negative   Physical Examination:   General Appearance: No distress  EYES PERRLA, EOM intact.   NECK Supple, No JVD Pulmonary: normal breath sounds, No wheezing.  CardiovascularNormal S1,S2.  No m/r/g.   Abdomen: Benign, Soft, non-tender. Neurology UE/LE 5/5 strength, no focal deficits Ext pulses intact, cap refill intact ALL OTHER ROS ARE NEGATIVE     ASSESSMENT AND PLAN SYNOPSIS 68 yo  pleasant white female with  signs and symptoms of excessive daytime sleepiness with diagnosis of obstructive sleep apnea in the setting of  deconditioned state, with hypoxic resp failure   Assessment of OSA Previous AHI 24-35 Continue CPAP as prescribed  Excellent compliance report Reviewed compliance report in detail with patient Patient definitely benefits the use of CPAP therapy as prescribed Using CPAP nightly and with naps Pressure setting is comfortable and is sleeping well. CPAP prescription 11 AHI reduced to 2.4  No evidence of acute heart failure at this time No respiratory distress No fevers, chills, nausea, vomiting, diarrhea No evidence hemoptysis  Patient Instructions Continue to use CPAP every night, minimum of 4-6 hours a night.  Change equipment every 30 days or as directed by DME.  Wash your tubing with warm soap and water daily, hang to dry. Wash humidifier portion weekly. Use bottled, distilled water and change daily   Be aware of reduced alertness and do not drive or operate heavy machinery if experiencing this or drowsiness.  Exercise encouraged, as tolerated. Encouraged proper weight management.  Important to get eight or more hours of sleep  Limiting the use of the computer and television before bedtime.  Decrease naps during the day, so night time sleep will become enhanced.  Limit caffeine, and sleep deprivation.  HTN, stroke, uncontrolled diabetes and heart failure are potential risk factors.  Risk of untreated sleep apnea including cardiac arrhthymias, stroke, DM, pulm HTN.   Obesity -recommend significant weight loss -recommend changing diet  Deconditioned state -Recommend increased daily activity and exercise   MEDICATION ADJUSTMENTS/LABS AND TESTS ORDERED: Continue CPAP as prescribed Avoid Allergens and Irritants Avoid secondhand smoke Avoid SICK contacts Recommend  Masking  when appropriate Recommend Keep up-to-date with  vaccinations    CURRENT MEDICATIONS REVIEWED AT LENGTH WITH PATIENT TODAY   Patient  satisfied with Plan of action and management. All questions answered   Follow up 1 year   I spent a total of 45 minutes dedicated to the care of this patient on the date of this encounter to include pre-visit review of records, face-to-face time with the patient discussing conditions above, post visit ordering of testing, clinical documentation with the electronic health record, making appropriate referrals as documented, and communicating necessary information to the patient's healthcare team.    The Patient requires high complexity decision making  for assessment and support, frequent evaluation and titration of therapies, application of advanced monitoring technologies and extensive interpretation of multiple databases.  Patient satisfied with Plan of action and management. All questions answered    Nickolas Alm Cellar, M.D.  [facility]  Medical Director [facility] Medical Director [facility] Cardio-Pulmonary Department

## 2024-06-23 NOTE — Patient Instructions (Addendum)
 Your procedure is scheduled on:06-30-24 Friday Report to the Registration Desk on the 1st floor of the [facility].Then proceed to the 2nd floor Surgery Desk To find out your arrival time, please call [phone] between 1PM - 3PM on:06-29-24 Thursday If your arrival time is 6:00 am, do not arrive before that time as the [facility] Mall entrance doors do not open until 6:00 am.  REMEMBER: Instructions that are not followed completely may result in serious medical risk, up to and including death; or upon the discretion of your surgeon and anesthesiologist your surgery may need to be rescheduled.  Do not eat food after midnight the night before surgery.  No gum chewing or hard candies.  You may however, drink CLEAR liquids up to 2 hours before you are scheduled to arrive for your surgery. Do not drink anything within 2 hours of your scheduled arrival time.  Clear liquids include: - water  - apple juice without pulp - gatorade (not RED colors) - black coffee or tea (Do NOT add milk or creamers to the coffee or tea) Do NOT drink anything that is not on this list.  In addition, your doctor has ordered for you to drink the provided:  Ensure Pre-Surgery Clear Carbohydrate Drink  Drinking this carbohydrate drink up to two hours before surgery helps to reduce insulin resistance and improve patient outcomes. Please complete drinking 2 hours before scheduled arrival time.  One week prior to surgery:Stop NOW (06-23-24) Stop Anti-inflammatories (NSAIDS) such as Advil , Aleve, Ibuprofen , Motrin , Naproxen, Naprosyn and Aspirin  based products such as Excedrin, Goody's Powder, BC Powder. Stop ANY OVER THE COUNTER supplements until after surgery (Melatonin, Vitamin B12)  You may however, continue to take Tylenol  if needed for pain up until the day of surgery.  Stop clopidogrel  (PLAVIX ) 5 days prior to surgery-Last dose will be on 06-24-24 Saturday  Continue taking all of your other prescription medications  up until the day of surgery.  ON THE DAY OF SURGERY ONLY TAKE THESE MEDICATIONS WITH SIPS OF WATER: -pantoprazole (PROTONIX)   Continue your 81 mg Aspirin  up until the day prior to surgery-Do NOT take the day of surgery  No Alcohol for 24 hours before or after surgery.  No Smoking including e-cigarettes for 24 hours before surgery.  No chewable tobacco products for at least 6 hours before surgery.  No nicotine patches on the day of surgery.  Do not use any recreational drugs for at least a week (preferably 2 weeks) before your surgery.  Please be advised that the combination of cocaine and anesthesia may have negative outcomes, up to and including death. If you test positive for cocaine, your surgery will be cancelled.  On the morning of surgery brush your teeth with toothpaste and water, you may rinse your mouth with mouthwash if you wish. Do not swallow any toothpaste or mouthwash.  Use CHG Soap as directed on instruction sheet.  Do not wear jewelry, make-up, hairpins, clips or nail polish.  For welded (permanent) jewelry: bracelets, anklets, waist bands, etc.  Please have this removed prior to surgery.  If it is not removed, there is a chance that hospital personnel will need to cut it off on the day of surgery.  Do not wear lotions, powders, or perfumes.   Do not shave body hair from the neck down 48 hours before surgery.  Contact lenses, hearing aids and dentures may not be worn into surgery.  Do not bring valuables to the hospital. [facility] is not  responsible for any missing/lost belongings or valuables.   Bring your C-PAP to the hospital   Notify your doctor if there is any change in your medical condition (cold, fever, infection).  Wear comfortable clothing (specific to your surgery type) to the hospital.  After surgery, you can help prevent lung complications by doing breathing exercises.  Take deep breaths and cough every 1-2 hours. Your doctor may order a  device called an Incentive Spirometer to help you take deep breaths. When coughing or sneezing, hold a pillow firmly against your incision with both hands. This is called "splinting." Doing this helps protect your incision. It also decreases belly discomfort.  If you are being admitted to the hospital overnight, leave your suitcase in the car. After surgery it may be brought to your room.  In case of increased patient census, it may be necessary for you, the patient, to continue your postoperative care in the Same Day Surgery department.  If you are being discharged the day of surgery, you will not be allowed to drive home. You will need a responsible individual to drive you home and stay with you for 24 hours after surgery.   If you are taking public transportation, you will need to have a responsible individual with you.  Please call the Pre-admissions Testing Dept. at [phone] if you have any questions about these instructions.  Surgery Visitation Policy:  Patients having surgery or a procedure may have two visitors.  Children under the age of 23 must have an adult with them who is not the patient.     Preparing for Surgery with CHLORHEXIDINE  GLUCONATE (CHG) Soap  Chlorhexidine  Gluconate (CHG) Soap  o An antiseptic cleaner that kills germs and bonds with the skin to continue killing germs even after washing  o Used for showering the night before surgery and morning of surgery  Before surgery, you can play an important role by reducing the number of germs on your skin.  CHG (Chlorhexidine  gluconate) soap is an antiseptic cleanser which kills germs and bonds with the skin to continue killing germs even after washing.  Please do not use if you have an allergy to CHG or antibacterial soaps. If your skin becomes reddened/irritated stop using the CHG.  1. Shower the NIGHT BEFORE SURGERY and the MORNING OF SURGERY with CHG soap.  2. If you choose to wash your hair, wash your  hair first as usual with your normal shampoo.  3. After shampooing, rinse your hair and body thoroughly to remove the shampoo.  4. Use CHG as you would any other liquid soap. You can apply CHG directly to the skin and wash gently with a scrungie or a clean washcloth.  5. Apply the CHG soap to your body only from the neck down. Do not use on open wounds or open sores. Avoid contact with your eyes, ears, mouth, and genitals (private parts). Wash face and genitals (private parts) with your normal soap.  6. Wash thoroughly, paying special attention to the area where your surgery will be performed.  7. Thoroughly rinse your body with warm water.  8. Do not shower/wash with your normal soap after using and rinsing off the CHG soap.  9. Pat yourself dry with a clean towel.  10. Wear clean pajamas to bed the night before surgery.  12. Place clean sheets on your bed the night of your first shower and do not sleep with pets.  13. Shower again with the CHG soap on the day  of surgery prior to arriving at the hospital.  14. Do not apply any deodorants/lotions/powders.  15. Please wear clean clothes to the hospital.   [employer] to address health-related social needs:  [email or web address]

## 2024-06-23 NOTE — Patient Instructions (Signed)

## 2024-06-25 ENCOUNTER — Encounter (INDEPENDENT_AMBULATORY_CARE_PROVIDER_SITE_OTHER): Payer: Self-pay | Admitting: Nurse Practitioner

## 2024-06-25 NOTE — Progress Notes (Signed)
 The patient noted prior to her sclerotherapy that she was going to have an invasive knee procedure within the next few weeks.  It would be in her best interest not to move forward with intervention as this could cause possible issues that may lead to cancellation of her surgery.  I have an abundance of caution we will hold off on sclerotherapy until she has been about 4 weeks post knee surgery.

## 2024-06-29 ENCOUNTER — Encounter: Payer: Self-pay | Admitting: Orthopedic Surgery

## 2024-06-29 NOTE — Anesthesia Preprocedure Evaluation (Signed)
 Anesthesia Evaluation  Patient identified by MRN, date of birth, ID band Patient awake    Reviewed: Allergy & Precautions, H&P , NPO status , Patient's Chart, lab work & pertinent test results  History of Anesthesia Complications (+) PONV and history of anesthetic complications  Airway Mallampati: III  TM Distance: >3 FB Neck ROM: full    Dental no notable dental hx.    Pulmonary sleep apnea    Pulmonary exam normal        Cardiovascular hypertension, + CAD, + Past MI, + Cardiac Stents (x5  2017), + Peripheral Vascular Disease (Carotid artery disease) and + DOE  Normal cardiovascular exam+ dysrhythmias (h/o Paroxysmal SVT) + Valvular Problems/Murmurs      Neuro/Psych  PSYCHIATRIC DISORDERS Anxiety Depression    negative neurological ROS     GI/Hepatic Neg liver ROS,,,Crohn disease Pharyngoesophageal dysphagia   Endo/Other  negative endocrine ROS    Renal/GU      Musculoskeletal  (+) Arthritis ,    Abdominal   Peds  Hematology negative hematology ROS (+)   Anesthesia Other Findings Past Medical History: No date: Allergy No date: Anxiety No date: Aortic atherosclerosis (HCC) No date: Atypical angina (HCC) No date: Chicken pox No date: Complication of anesthesia     Comment:  a.) PONV No date: Coronary artery disease No date: Crohn disease (HCC) No date: Depression No date: DOE (dyspnea on exertion) 2017: DVT (deep venous thrombosis) (HCC) No date: Elevated lipoprotein(a) No date: GERD (gastroesophageal reflux disease) No date: Heart murmur 06/27/2021: History of 2019 novel coronavirus disease (COVID-19) No date: History of kidney stones No date: Hyperlipidemia No date: Hypertension No date: IDA (iron deficiency anemia) No date: Inflammatory bowel disease No date: Insomnia     Comment:  a.) uses melatonin PRN No date: Long term current use of clopidogrel  No date: Long-term use of aspirin  therapy No  date: Migraines     Comment:  hormonal, puberty 06/07/2016: NSTEMI (non-ST elevated myocardial infarction) (HCC) No date: OSA on CPAP No date: Osteoarthritis No date: Osteoporosis 2019: Palpitation No date: Paroxysmal SVT (supraventricular tachycardia) (HCC) No date: Phlebitis No date: PONV (postoperative nausea and vomiting) No date: S/P primary angioplasty with coronary stent     Comment:  5 stents No date: Shingles No date: Varicose veins of both lower extremities  Past Surgical History: 07/05/2019: BREAST BIOPSY; Left     Comment:  FIBROEPITHELIAL lesion/neg No date: BREAST CYST ASPIRATION     Comment:  unsure of side 12/02/2017: CARPAL TUNNEL RELEASE; Right     Comment:  Procedure: CARPAL TUNNEL RELEASE ENDOSCOPIC;  Surgeon:               Edie Norleen PARAS, MD;  Location: [facility] ORS;  Service:               Orthopedics;  Laterality: Right; 2017: CORONARY ANGIOPLASTY WITH STENT PLACEMENT     Comment:  x 5 No date: EYE SURGERY; Bilateral     Comment:  cataract surgery 12/02/2017: GANGLION CYST EXCISION; Right     Comment:  Procedure: REMOVAL GANGLION OF WRIST;  Surgeon: Edie Norleen PARAS, MD;  Location: [facility] ORS;  Service: Orthopedics;                Laterality: Right; No date: heart murmur 04/19/2020: HYSTEROSCOPY WITH D & C; N/A     Comment:  Procedure: DILATATION AND CURETTAGE /HYSTEROSCOPY,  POSSIBLE  POLYPECTOMY;  Surgeon: Verdon Keen, MD;                Location: [facility] ORS;  Service: Gynecology;  Laterality:               N/A; 1962: TONSILLECTOMY AND ADENOIDECTOMY 1993: TUBAL LIGATION 1990: tubaligation     Reproductive/Obstetrics negative OB ROS                              Anesthesia Physical Anesthesia Plan  ASA: 3  Anesthesia Plan: General LMA   Post-op Pain Management: Ofirmev  IV (intra-op)* and Celebrex  PO (pre-op)*   Induction: Intravenous  PONV Risk Score and Plan: 3 and Dexamethasone ,  Ondansetron , Midazolam , Propofol  infusion and TIVA  Airway Management Planned: LMA  Additional Equipment:   Intra-op Plan:   Post-operative Plan: Extubation in OR  Informed Consent: I have reviewed the patients History and Physical, chart, labs and discussed the procedure including the risks, benefits and alternatives for the proposed anesthesia with the patient or authorized representative who has indicated his/her understanding and acceptance.     Dental Advisory Given  Plan Discussed with: Anesthesiologist, CRNA and Surgeon  Anesthesia Plan Comments:          Anesthesia Quick Evaluation

## 2024-06-29 NOTE — Progress Notes (Signed)
 Perioperative / Anesthesia Services  Pre-Admission Testing Clinical Review / Pre-Operative Anesthesia Consult  Date: 06/29/24  PATIENT DEMOGRAPHICS: Name: Morgan Moore DOB: 09-Jan-1956 MRN:   [id number]  Note: Available PAT nursing documentation and vital signs have been reviewed. Clinical nursing staff has updated patient's PMH/PSHx, current medication list, and drug allergies/intolerances to ensure complete and comprehensive history available to assist care teams in MDM as it pertains to the aforementioned surgical procedure and anticipated anesthetic course. Extensive review of available clinical information personally performed. Bacon PMH and PSHx updated with any diagnoses/procedures that  may have been inadvertently omitted during her intake with the pre-admission testing department's nursing staff.  PLANNED SURGICAL PROCEDURE(S):   Case: [id number] Date/Time: 06/30/24 1144   Procedures:      DEBRIDEMENT, MENISCUS, KNEE (Left: Knee)     ARTHROSCOPY, KNEE, WITH MEDIAL MENISCECTOMY (Left: Knee)   Anesthesia type: General LMA   Diagnosis: Internal derangement of knee, left [M23.92]   Pre-op diagnosis: Internal derangement of knee, left   Location: [facility] OR ROOM 01 / [facility]   Surgeons: Mardee Lynwood SQUIBB, MD        CLINICAL DISCUSSION: Morgan Moore is a 68 y.o. female who is submitted for pre-surgical anesthesia review and clearance prior to her undergoing the above procedure. Patient has never been a smoker in the past.  Pertinent PMH includes: CAD, NSTEMI x 2, PSVT, BILATERAL carotid artery disease,, cardiac murmur, aortic atherosclerosis, palpitations, atypical angina, lower extremity venous varicosities, DVT, HTN, HLD, DOE, OSAH (requires nocturnal PAP therapy), GERD (on daily PPI), IDA, OA, anxiety, depression, insomnia.  Patient is followed by cardiology Marino, MD). She was last seen in the cardiology clinic on 05/01/2024; notes  reviewed. At the time of her clinic visit, patient doing well overall from a cardiovascular perspective.  Patient did make mention an episode approximately a month prior where she woke up with her blood pressure being high and tingling in her extremities.  Patient eventually seen in the ED at [facility] and ruled out for MRI.  Since that time, patient denied any chest pain, shortness of breath, PND, orthopnea, palpitations, significant peripheral edema, weakness, fatigue, vertiginous symptoms, or presyncope/syncope. Patient with a past medical history significant for cardiovascular diagnoses. Documented physical exam was grossly benign, providing no evidence of acute exacerbation and/or decompensation of the patient's known cardiovascular conditions.  Patient underwent diagnostic LEFT heart catheterization on 09/24/2014.  Study revealed multivessel CAD; 25% proximal LAD, 50% mid LAD, 25% ostial LCx, 50% mid LCx, and 25% proximal RCA.  Interventional cardiology made the decision to defer intervention at that time opting for aggressive secondary prevention via medical management.  Patient suffered an NSTEMI on 06/07/2016.  Patient underwent diagnostic LEFT heart catheterization revealing multivessel CAD; 40% LAD, 80% proximal LCx, 90% mid LCx, 20% proximal RCA, and 20% mid RCA.  PCI was subsequently performed placing a 2.75 x 12 mm Resolute Onyx DES to the proximal LCx and a 2.25 x 22 mm Resolute Onyx DES to the mid LCx.  Procedure yielded excellent angiographic result and TIMI-3 flow.  Patient underwent diagnostic LEFT heart catheterization on 05/07/2021.  Study revealed multivessel CAD; 90% proximal LAD and 80% mid RCA.  PCI was performed placing a 3.0 x 15 mm Onyx stent to the proximal LAD and a 2.5 x 22 mm Onyx stent to the mid RCA.  Procedure yielded excellent angiographic result TIMI-3 flow.  Patient underwent diagnostic LEFT heart catheterization on 11/28/2021 revealing a 95% in-stent restenosis of  the  previously placed mid LAD stent.  95% stenosis was noted.  PCI was subsequent performed placing a 2.75 x 12 mm Onyx Frontier DES to the mid LAD.  Procedure yielded excellent angiographic result and TIMI-3 flow.  Coronary CTA was performed on 04/03/2021.  Coronary calcium  score was unable to be obtained due to the presence of the previously placed stents, which will result in falsely elevated score.  Severe stenosis of the proximal LAD was observed.  Coronary stents were noted and the LCx with obstructive CAD.  In-stent restenosis could not be excluded.  CAD RADS 4 indicating severe stenosis. FFT-ct values unable to be obtained.  Most recent cardiac catheterization was performed on 01/23/2022 revealing overall insignificant coronary artery disease.  iFR of the proximal LCx was 0.98.  Interventional cardiology made the decision to defer further intervention opting for medical management.  Stress echocardiogram was performed on 10/23/2023.  Patient able to obtain a total of 6.4 METS during the testing.  There were no regional wall motion abnormalities at rest.  Grade 1 diastolic dysfunction was observed.  Trivial mitral, tricuspid, and pulmonary valve regurgitation noted.  All transvalvular gradients within the normal body no evidence suggestive of valvular stenosis.  Cardiac MRI was performed on 01/19/2024 revealing a normal left ventricular systolic function with an EF of 65%.  There was no evidence of LGE in the left ventricular myocardium.  No evidence of prior infarct, scar, or infiltrative disease.  Patient with a history of known carotid artery disease.  Most recent carotid Doppler study performed on 04/06/2024 revealed a 1-39% % stenosis of the RICA with a contralateral 59-40% in the LICA.  Vertebral arteries demonstrated antegrade flow.  There were normal flow hemodynamics seen in the subclavian arteries.  Given patient's past cardiovascular history and multiple stent placements, patient remains on  daily DAPT therapy using ASA + compatible.  Patient reportedly compliant with therapy with no evidence or reports of GI/GU related bleeding.  Blood pressure reasonably controlled at 136/70 on currently prescribed ARB (losartan), diuretic (spironolactone), and CCB (verapamil ) therapies.  Patient is on PCSK9i (evolocumab) for her HLD diagnosis and further ASCVD prevention. Patient has a supply of short acting nitrates (NTG) to use on a as needed basis for recurrent angina/anginal equivalent symptoms; denied recent use.  Patient is not diabetic.  She does have an OSAH diagnosis and is reportedly compliant with prescribed nocturnal PAP therapy. Patient is able to complete all of her  ADL/IADLs without cardiovascular limitation.  Per the DASI, patient is able to achieve at least 4 METS of physical activity without experiencing any significant degree of angina/anginal equivalent symptoms.  Several changes were made to her antihypertensive regimen.  Patient to follow-up with outpatient cardiology in 3 months or sooner if needed.  Morgan Moore is scheduled for an elective DEBRIDEMENT, MENISCUS, KNEE (Left: Knee); ARTHROSCOPY, KNEE, WITH MEDIAL MENISCECTOMY (Left: Knee) on 06/30/2024 with Dr. Lynwood Hue, MD.  Given patient's past medical history significant for cardiovascular diagnoses, presurgical cardiac clearance was sought by the PAT team. Per cardiology, this patient is optimized for surgery and may proceed with the planned procedural course with a LOW risk of significant perioperative cardiovascular complications.  Again, this patient is on daily DAPT therapy.  Patient has been instructed on recommendations from cardiology for holding her medical dose for 5 days prior to her procedure with plans to restart since postoperatively respectively minimized by her primary attending surgeon.  Patient is aware that her last dose of clopidogrel  should  be on 06/24/2024. Given that patient's past medical history  is significant for cardiovascular diagnoses, including but not limited to CAD, orthopedics has cleared patient to continue her daily low dose ASA throughout her perioperative course. She will be asked to hold her normal dose on the day of her procedure only. Patient has been updated on these directives from her specialty care providers by the PAT team.  Patient reports previous perioperative complications with anesthesia in the past. Patient has a PMH (+) for PONV. Symptoms and history of PONV will be discussed with patient by anesthesia team on the day of her procedure. Interventions will be ordered as deemed necessary based on patient's individual care needs as determined by anesthesiologist. In review her EMR, it is noted that patient underwent a MAC anesthetic course at [facility] (ASA III) in 09/2023 without documented complications.   MOST RECENT VITAL SIGNS:    06/23/2024   10:28 AM 06/22/2024    2:50 PM 04/19/2024    8:12 AM  Vitals with BMI  Height 5' 1  5' 1  Weight 170 lbs 3 oz 170 lbs 13 oz 166 lbs  BMI 32.18  31.38  Systolic 130 121 --  Diastolic 80 69 --  Pulse 67 82    PROVIDERS/SPECIALISTS: NOTE: Primary physician provider listed below. Patient may have been seen by APP or partner within same practice.   PROVIDER ROLE / SPECIALTY LAST OV  Hooten, Lynwood SQUIBB, MD Orthopedics (Surgeon) 06/06/2024  Morgan Shad, MD Primary Care Provider 03/07/2024  Buren Charleston, MD Cardiology 05/01/2024  Isaiah Scrivener, MD Pulmonary Medicine 06/23/2024  Jama Hacker, MD Vascular Surgery 06/22/2024   ALLERGIES: Allergies  Allergen Reactions   Ciprofloxacin Anaphylaxis   Keflex [Cephalexin] Anaphylaxis   Lisinopril Anaphylaxis   Meclizine Anaphylaxis   Monurol [Fosfomycin Tromethamine   (Obsolete)] Rash    Splotchy rash all over body. Breathing became difficult/tightened. Took benadryl immediately so she did not swell    Sulfa Antibiotics Anaphylaxis   Tessalon  [Benzonatate] Anaphylaxis   Influenza Virus Vaccine Other (See Comments)    History of Epstein Barr   Codeine Nausea And Vomiting    CURRENT HOME MEDICATIONS: No current facility-administered medications for this encounter.    aspirin  EC 81 MG tablet   clopidogrel  (PLAVIX ) 75 MG tablet   cyanocobalamin  1000 MCG tablet   diphenhydrAMINE (BENADRYL) 12.5 MG/5ML elixir   EPINEPHrine  0.3 mg/0.3 mL IJ SOAJ injection   escitalopram  (LEXAPRO ) 10 MG tablet   losartan (COZAAR) 25 MG tablet   melatonin 3 MG TABS tablet   mesalamine  (LIALDA ) 1.2 g EC tablet   metroNIDAZOLE  (METROGEL ) 1 % gel   nitroGLYCERIN  (NITROSTAT ) 0.4 MG SL tablet   pantoprazole (PROTONIX) 20 MG tablet   prednisoLONE  acetate (PRED FORTE ) 1 % ophthalmic suspension   REPATHA 140 MG/ML SOSY   spironolactone (ALDACTONE) 25 MG tablet   verapamil  (VERELAN ) 120 MG 24 hr capsule   HISTORY: Past Medical History:  Diagnosis Date   Allergy    Anxiety    Aortic atherosclerosis (HCC)    Atypical angina (HCC)    Chicken pox    Complication of anesthesia    a.) PONV   Coronary artery disease    a.) NSTEMI 06/08/2016 --> 80% pLCx 2.75 x 12 mm Resolute Onyx DES), 90% mRCA ( 2.25 x 22 mm Resolute Onyx DES); b.) LHC/PCI 05/07/2021: 90% pLAD (3.0 x 15 mm Onyx DES). 80% mRCA ( 2.5 x 22 mm Onyx DES); c.) NSTEMI 11/27/2021 --> 95% ISR  LAD (2.75 x 12 Onyx Frontier)   Crohn disease (HCC)    Depression    DOE (dyspnea on exertion)    DVT (deep venous thrombosis) (HCC) 2017   Elevated lipoprotein(a)    GERD (gastroesophageal reflux disease)    Heart murmur    History of 2019 novel coronavirus disease (COVID-19) 06/27/2021   History of kidney stones    Hyperlipidemia    Hypertension    IDA (iron deficiency anemia)    Inflammatory bowel disease    Insomnia    a.) uses melatonin PRN   Long term current use of clopidogrel     Long-term use of aspirin  therapy    Migraines    NSTEMI (non-ST elevated myocardial infarction) (HCC)  06/07/2016   a. LHC/PCI 06/08/2016: 40% LAD, 80% pLCx (2.75 x 12 mm Resolute Onyx DES), 90% mLCx (2.25 x 22 mm Resolute Onyx), 20% pRCA, 20% mRCA   NSTEMI (non-ST elevated myocardial infarction) (HCC) 11/27/2021   a.) LHC/PCI 11/28/2021: 95% ISR mLAD (2.75 x 12 Onyx Frontier DES)   OSA on CPAP    Osteoarthritis    Osteoporosis    Palpitation 2019   Paroxysmal SVT (supraventricular tachycardia) (HCC)    Phlebitis    PONV (postoperative nausea and vomiting)    S/P primary angioplasty with coronary stent    a.) TOTAL OF: 5 stents as of 06/29/2024   Shingles    Status post bilateral cataract extraction    Varicose veins of both lower extremities    Past Surgical History:  Procedure Laterality Date   BREAST BIOPSY Left 07/05/2019   FIBROEPITHELIAL lesion/neg   BREAST CYST ASPIRATION     unsure of side   CARPAL TUNNEL RELEASE Right 12/02/2017   Procedure: CARPAL TUNNEL RELEASE ENDOSCOPIC;  Surgeon: Edie Norleen PARAS, MD;  Location: [facility] ORS;  Service: Orthopedics;  Laterality: Right;   CATARACT EXTRACTION Bilateral    CORONARY ANGIOPLASTY WITH STENT PLACEMENT Left 06/08/2016   Procedure: CORONARY ANGIOPLASTY WITH STENT PLACEMENT; Location: [facility]   CORONARY ANGIOPLASTY WITH STENT PLACEMENT Left 05/07/2021   Procedure:CORONARY ANGIOPLASTY WITH STENT PLACEMENT; Location: [facility]; Surgeon: Jerilynn Cleveland, MD   CORONARY ANGIOPLASTY WITH STENT PLACEMENT Left 11/28/2021   Procedure:CORONARY ANGIOPLASTY WITH STENT PLACEMENT; Location: [facility]; Surgeon: Karyle Dunk, MD   GANGLION CYST EXCISION Right 12/02/2017   Procedure: REMOVAL GANGLION OF WRIST;  Surgeon: Edie Norleen PARAS, MD;  Location: [facility] ORS;  Service: Orthopedics;  Laterality: Right;   HYSTEROSCOPY WITH D & C N/A 04/19/2020   Procedure: DILATATION AND CURETTAGE /HYSTEROSCOPY, POSSIBLE  POLYPECTOMY;  Surgeon: Verdon Keen, MD;  Location: [facility] ORS;  Service: Gynecology;  Laterality: N/A;   LEFT HEART CATH AND CORONARY ANGIOGRAPHY Left  09/24/2014   Procedure: LEFT HEART CATH AND CORONARY ANGIOGRAPHY; Location: [facility]; Surgeon: Margie Lovelace, MD   LEFT HEART CATH AND CORONARY ANGIOGRAPHY Left 01/23/2022   Procedure: LEFT HEART CATH AND CORONARY ANGIOGRAPHY; Location: [facility]; Surgeon: Ozell Estelle, MD   TONSILLECTOMY AND ADENOIDECTOMY  1962   TUBAL LIGATION  1993   Family History  Problem Relation Age of Onset   Heart disease Father    Heart disease Brother    Cancer Maternal Aunt    Breast cancer Maternal Aunt    Stroke Mother    Kidney disease Neg Hx    GU problems Neg Hx    Kidney cancer Neg Hx    Social History   Tobacco Use   Smoking status: Never   Smokeless tobacco: Never  Substance Use Topics   Alcohol  use: No    Alcohol/week: 0.0 standard drinks of alcohol   LABS:  Hospital Outpatient Visit on 06/23/2024  Component Date Value Ref Range Status   WBC 06/23/2024 8.1  4.0 - 10.5 K/uL Final   RBC 06/23/2024 3.92  3.87 - 5.11 MIL/uL Final   Hemoglobin 06/23/2024 12.4  12.0 - 15.0 g/dL Final   HCT 91/84/7974 36.0  36.0 - 46.0 % Final   MCV 06/23/2024 91.8  80.0 - 100.0 fL Final   MCH 06/23/2024 31.6  26.0 - 34.0 pg Final   MCHC 06/23/2024 34.4  30.0 - 36.0 g/dL Final   RDW 91/84/7974 14.0  11.5 - 15.5 % Final   Platelets 06/23/2024 293  150 - 400 K/uL Final   nRBC 06/23/2024 0.0  0.0 - 0.2 % Final   Performed at [facility], [address] [place], [place] [address]   Sodium 06/23/2024 140  135 - 145 mmol/L Final   Potassium 06/23/2024 3.9  3.5 - 5.1 mmol/L Final   Chloride 06/23/2024 106  98 - 111 mmol/L Final   CO2 06/23/2024 24  22 - 32 mmol/L Final   Glucose, Bld 06/23/2024 119 (H)  70 - 99 mg/dL Final   Glucose reference range applies only to samples taken after fasting for at least 8 hours.   BUN 06/23/2024 21  8 - 23 mg/dL Final   Creatinine, Ser 06/23/2024 1.00  0.44 - 1.00 mg/dL Final   Calcium  06/23/2024 9.8  8.9 - 10.3 mg/dL Final   GFR, Estimated 06/23/2024 >60  >60 mL/min  Final   Comment: (NOTE) Calculated using the CKD-EPI Creatinine Equation (2021)    Anion gap 06/23/2024 10  5 - 15 Final   Performed at [facility], [address] [place], [place] [address]    ECG: Date: 06/23/2024  Time ECG obtained: 1503 PM Rate: 66 bpm Rhythm: normal sinus Axis (leads I and aVF): normal Intervals: PR 148 ms. QRS 98 ms. QTc 421 ms. ST segment and T wave changes: No evidence of acute T wave abnormalities or significant ST segment elevation or depression.  Evidence of a possible, age undetermined, prior infarct:  Yes; inferior and anterolateral Comparison: Similar to previous tracing obtained on 04/01/2021   IMAGING / PROCEDURES: DIAGNOSTIC RADIOGRAPHS OF LEFT KNEE 3 VIEWS performed on 06/06/2024 Good preservation of the cartilage space.  Chondrocalcinosis is appreciated.  No significant osteophyte formation or subchondral sclerosis.  No evidence of fracture or dislocation.   VAS US  CAROTID performed on 04/06/2024 Velocities in the right ICA are consistent with a 1-39% stenosis.  Velocities in the left ICA are consistent with a 40-59% stenosis.  Bilateral vertebral arteries demonstrate antegrade flow.  Normal flow hemodynamics were seen in bilateral subclavian arteries.   MR CARDIAC MORPHOLOGY W WO CONTRAST performed on 01/19/2024 Normal LV systolic function.  LVEF 65%. No LGE noted. No evidence for infiltrative disease. Normal RV function. No significant valvular abnormalities.  STRESS ECHOCARDIOGRAM performed on 10/23/2023 Normal stress test.  Normal resting study with no wall motion abnormalities at rest and peak stress.  Maximum workload of 6.4 METs was achieved during exercise.  Resting hypertension - appropriate response   TRANSTHORACIC ECHOCARDIOGRAM performed on 11/29/2021 Normal left ventricular systolic function with an EF of >55% Mild concentric LVH Left ventricular diastolic Doppler parameters were indeterminant Left  ventricular GLS -15.5% Normal right ventricular size and function; TAPSE = 1.7 cm Trivial MR and TR No aortic or mitral valve stenosis Slight resting LVOT gradient 3.4  mmHg (0.93 m/s) that increases to 26 mmHg (2.5 m/s) with Valsalva RVSP = 21 mmHg.  CT CORONARY MORPH W/CTA COR W/SCORE W/CA W/CM &/OR WO/CM W/ FRACTIONAL FLOW RESERVE FLUID ANALYSIS performed on 04/03/2021 Hypervascular lesions in the right lobe of the liver, incompletely characterized on today's arterial phase examination. Further evaluation with nonemergent abdominal MRI with and without IV gadolinium is strongly recommended in the near future to exclude neoplasm. Coronary calcium  score not obtained due to presence of LCx stent (results will be falsely elevated) Normal coronary origin with right dominance. Severe stenosis in the proximal LAD Coronary stents present in the LCx, obstructive CAD (in stent stenosis) can't be excluded in the LCx CAD-RADS 4 Severe stenosis. (70-99% or > 50% left main). Cardiac catheterization or CT FFR is recommended. CT FFR will be submitted and reported separately. Consider symptom-guided anti-ischemic pharmacotherapy as well as risk factor modification per guideline directed care.    IMPRESSION AND PLAN: Makyia Erxleben has been referred for pre-anesthesia review and clearance prior to her undergoing the planned anesthetic and procedural courses. Available labs, pertinent testing, and imaging results were personally reviewed by me in preparation for upcoming operative/procedural course. [facility] medical record has been updated following extensive record review and patient interview with PAT staff.   This patient has been appropriately cleared by cardiology with an overall LOW risk of patient experiencing significant perioperative cardiovascular complications. Based on clinical review performed today (06/29/24), barring any significant acute changes in the patient's overall condition, it is  anticipated that she will be able to proceed with the planned surgical intervention. Any acute changes in clinical condition may necessitate her procedure being postponed and/or cancelled. Patient will meet with anesthesia team (MD and/or CRNA) on the day of her procedure for preoperative evaluation/assessment. Questions regarding anesthetic course will be fielded at that time.   Pre-surgical instructions were reviewed with the patient during his PAT appointment, and questions were fielded to satisfaction by PAT clinical staff. She has been instructed on which medications that she will need to hold prior to surgery, as well as the ones that have been deemed safe/appropriate to take on the day of her procedure. As part of the general education provided by PAT, patient made aware both verbally and in writing, that she would need to abstain from the use of any illegal substances during her perioperative course. She was advised that failure to follow the provided instructions could necessitate case cancellation or result in serious perioperative complications up to and including death. Patient encouraged to contact PAT and/or her surgeon's office to discuss any questions or concerns that may arise prior to surgery; verbalized understanding.   Dorise Pereyra, MSN, APRN, FNP-C, CEN [facility]  Perioperative Services Nurse Practitioner Phone: [phone] Fax: [phone] 06/29/24 10:03 AM  NOTE: This note has been prepared using Dragon dictation software. Despite my best ability to proofread, there is always the potential that unintentional transcriptional errors may still occur from this process.

## 2024-06-30 ENCOUNTER — Other Ambulatory Visit: Payer: Self-pay

## 2024-06-30 ENCOUNTER — Ambulatory Visit: Payer: Self-pay | Admitting: Urgent Care

## 2024-06-30 ENCOUNTER — Encounter: Payer: Self-pay | Admitting: Orthopedic Surgery

## 2024-06-30 ENCOUNTER — Encounter
Admission: RE | Disposition: A | Discharge status: Home / Self Care | Payer: Self-pay | Source: Home / Self Care | Attending: Orthopedic Surgery

## 2024-06-30 ENCOUNTER — Ambulatory Visit
Admission: RE | Admit: 2024-06-30 | Discharge: 2024-06-30 | Disposition: A | Discharge status: Home / Self Care | Attending: Orthopedic Surgery | Admitting: Orthopedic Surgery

## 2024-06-30 DIAGNOSIS — I25118 Atherosclerotic heart disease of native coronary artery with other forms of angina pectoris: Secondary | ICD-10-CM | POA: Insufficient documentation

## 2024-06-30 DIAGNOSIS — M1990 Unspecified osteoarthritis, unspecified site: Secondary | ICD-10-CM | POA: Insufficient documentation

## 2024-06-30 DIAGNOSIS — F32A Depression, unspecified: Secondary | ICD-10-CM | POA: Diagnosis not present

## 2024-06-30 DIAGNOSIS — F419 Anxiety disorder, unspecified: Secondary | ICD-10-CM | POA: Diagnosis not present

## 2024-06-30 DIAGNOSIS — Z955 Presence of coronary angioplasty implant and graft: Secondary | ICD-10-CM | POA: Diagnosis not present

## 2024-06-30 DIAGNOSIS — M2392 Unspecified internal derangement of left knee: Secondary | ICD-10-CM | POA: Diagnosis not present

## 2024-06-30 DIAGNOSIS — M23222 Derangement of posterior horn of medial meniscus due to old tear or injury, left knee: Secondary | ICD-10-CM | POA: Diagnosis not present

## 2024-06-30 DIAGNOSIS — D509 Iron deficiency anemia, unspecified: Secondary | ICD-10-CM | POA: Insufficient documentation

## 2024-06-30 DIAGNOSIS — E785 Hyperlipidemia, unspecified: Secondary | ICD-10-CM | POA: Diagnosis not present

## 2024-06-30 DIAGNOSIS — I4719 Other supraventricular tachycardia: Secondary | ICD-10-CM | POA: Diagnosis not present

## 2024-06-30 DIAGNOSIS — G4733 Obstructive sleep apnea (adult) (pediatric): Secondary | ICD-10-CM | POA: Diagnosis not present

## 2024-06-30 DIAGNOSIS — I252 Old myocardial infarction: Secondary | ICD-10-CM | POA: Diagnosis not present

## 2024-06-30 DIAGNOSIS — I779 Disorder of arteries and arterioles, unspecified: Secondary | ICD-10-CM | POA: Insufficient documentation

## 2024-06-30 DIAGNOSIS — Z86718 Personal history of other venous thrombosis and embolism: Secondary | ICD-10-CM | POA: Diagnosis not present

## 2024-06-30 DIAGNOSIS — Z79899 Other long term (current) drug therapy: Secondary | ICD-10-CM | POA: Insufficient documentation

## 2024-06-30 DIAGNOSIS — F418 Other specified anxiety disorders: Secondary | ICD-10-CM | POA: Diagnosis not present

## 2024-06-30 DIAGNOSIS — I700 Atherosclerosis of aorta: Secondary | ICD-10-CM | POA: Insufficient documentation

## 2024-06-30 DIAGNOSIS — K5090 Crohn's disease, unspecified, without complications: Secondary | ICD-10-CM | POA: Diagnosis not present

## 2024-06-30 DIAGNOSIS — Z7982 Long term (current) use of aspirin: Secondary | ICD-10-CM | POA: Insufficient documentation

## 2024-06-30 DIAGNOSIS — K219 Gastro-esophageal reflux disease without esophagitis: Secondary | ICD-10-CM | POA: Insufficient documentation

## 2024-06-30 DIAGNOSIS — Z9889 Other specified postprocedural states: Secondary | ICD-10-CM

## 2024-06-30 DIAGNOSIS — S83242A Other tear of medial meniscus, current injury, left knee, initial encounter: Secondary | ICD-10-CM | POA: Insufficient documentation

## 2024-06-30 DIAGNOSIS — W1842XA Slipping, tripping and stumbling without falling due to stepping into hole or opening, initial encounter: Secondary | ICD-10-CM | POA: Insufficient documentation

## 2024-06-30 DIAGNOSIS — I10 Essential (primary) hypertension: Secondary | ICD-10-CM | POA: Insufficient documentation

## 2024-06-30 HISTORY — DX: Iron deficiency anemia, unspecified: D50.9

## 2024-06-30 HISTORY — DX: Disorder of arteries and arterioles, unspecified: I77.9

## 2024-06-30 HISTORY — DX: Other forms of angina pectoris: I20.89

## 2024-06-30 HISTORY — DX: Cataract extraction status, left eye: Z98.41

## 2024-06-30 HISTORY — DX: Zoster without complications: B02.9

## 2024-06-30 HISTORY — DX: Insomnia, unspecified: G47.00

## 2024-06-30 HISTORY — DX: Other forms of dyspnea: R06.09

## 2024-06-30 HISTORY — DX: Unspecified osteoarthritis, unspecified site: M19.90

## 2024-06-30 HISTORY — DX: Elevated lipoprotein(a): E78.41

## 2024-06-30 HISTORY — DX: Asymptomatic varicose veins of bilateral lower extremities: I83.93

## 2024-06-30 HISTORY — DX: Long term (current) use of aspirin: Z79.82

## 2024-06-30 HISTORY — DX: Age-related osteoporosis without current pathological fracture: M81.0

## 2024-06-30 HISTORY — PX: KNEE ARTHROSCOPY WITH MEDIAL MENISECTOMY: SHX5651

## 2024-06-30 HISTORY — PX: MENISCUS DEBRIDEMENT: SHX5178

## 2024-06-30 SURGERY — DEBRIDEMENT, MENISCUS, KNEE
Anesthesia: General | Site: Knee | Laterality: Left

## 2024-06-30 MED ORDER — HYDROCODONE-ACETAMINOPHEN 5-325 MG PO TABS: 1.0000 | ORAL_TABLET | Freq: Four times a day (QID) | ORAL | 0 refills | Status: DC | PRN

## 2024-06-30 MED ORDER — KETAMINE HCL 50 MG/5ML IJ SOSY
PREFILLED_SYRINGE | INTRAMUSCULAR | Status: AC
  Filled 2024-06-30: qty 5

## 2024-06-30 MED ORDER — KETAMINE HCL 50 MG/5ML IJ SOSY
PREFILLED_SYRINGE | INTRAMUSCULAR | Status: DC | PRN
  Administered 2024-06-30: 30 mg via INTRAVENOUS

## 2024-06-30 MED ORDER — DEXAMETHASONE SODIUM PHOSPHATE 10 MG/ML IJ SOLN
INTRAMUSCULAR | Status: AC
  Filled 2024-06-30: qty 1

## 2024-06-30 MED ORDER — METOCLOPRAMIDE HCL 5 MG/ML IJ SOLN: 5.0000 mg | Freq: Three times a day (TID) | INTRAMUSCULAR | Status: DC | PRN

## 2024-06-30 MED ORDER — ACETAMINOPHEN 325 MG PO TABS: 325.0000 mg | ORAL_TABLET | Freq: Four times a day (QID) | ORAL | Status: DC | PRN

## 2024-06-30 MED ORDER — MORPHINE SULFATE (PF) 4 MG/ML IV SOLN
INTRAVENOUS | Status: AC
  Filled 2024-06-30: qty 1

## 2024-06-30 MED ORDER — LACTATED RINGERS IV SOLN: INTRAVENOUS | Status: DC

## 2024-06-30 MED ORDER — ONDANSETRON HCL 4 MG/2ML IJ SOLN
INTRAMUSCULAR | Status: AC
  Filled 2024-06-30: qty 2

## 2024-06-30 MED ORDER — ONDANSETRON HCL 4 MG/2ML IJ SOLN
INTRAMUSCULAR | Status: DC | PRN
  Administered 2024-06-30: 4 mg via INTRAVENOUS

## 2024-06-30 MED ORDER — HYDROCODONE-ACETAMINOPHEN 7.5-325 MG PO TABS: 1.0000 | ORAL_TABLET | ORAL | Status: DC | PRN

## 2024-06-30 MED ORDER — DROPERIDOL 2.5 MG/ML IJ SOLN: 0.6250 mg | Freq: Once | INTRAMUSCULAR | Status: DC | PRN

## 2024-06-30 MED ORDER — PROPOFOL 1000 MG/100ML IV EMUL
INTRAVENOUS | Status: AC
  Filled 2024-06-30: qty 100

## 2024-06-30 MED ORDER — CELECOXIB 200 MG PO CAPS
400.0000 mg | ORAL_CAPSULE | Freq: Once | ORAL | Status: AC
  Administered 2024-06-30: 400 mg via ORAL

## 2024-06-30 MED ORDER — CELECOXIB 200 MG PO CAPS
ORAL_CAPSULE | ORAL | Status: AC
Start: 2024-06-30 — End: 2024-06-30
  Filled 2024-06-30: qty 2

## 2024-06-30 MED ORDER — ACETAMINOPHEN 10 MG/ML IV SOLN: 1000.0000 mg | Freq: Once | INTRAVENOUS | Status: DC | PRN

## 2024-06-30 MED ORDER — GLYCOPYRROLATE 0.2 MG/ML IJ SOLN
INTRAMUSCULAR | Status: DC | PRN
  Administered 2024-06-30: .1 mg via INTRAVENOUS

## 2024-06-30 MED ORDER — PROPOFOL 500 MG/50ML IV EMUL
INTRAVENOUS | Status: DC | PRN
  Administered 2024-06-30: 150 ug/kg/min via INTRAVENOUS

## 2024-06-30 MED ORDER — LIDOCAINE HCL (CARDIAC) PF 100 MG/5ML IV SOSY
PREFILLED_SYRINGE | INTRAVENOUS | Status: DC | PRN
  Administered 2024-06-30: 100 mg via INTRAVENOUS

## 2024-06-30 MED ORDER — MORPHINE SULFATE 4 MG/ML IJ SOLN
INTRAMUSCULAR | Status: DC | PRN
  Administered 2024-06-30: 26 mL

## 2024-06-30 MED ORDER — METOCLOPRAMIDE HCL 10 MG PO TABS: 5.0000 mg | ORAL_TABLET | Freq: Three times a day (TID) | ORAL | Status: DC | PRN

## 2024-06-30 MED ORDER — ONDANSETRON HCL 4 MG/2ML IJ SOLN: 4.0000 mg | Freq: Four times a day (QID) | INTRAMUSCULAR | Status: DC | PRN

## 2024-06-30 MED ORDER — CHLORHEXIDINE GLUCONATE 0.12 % MT SOLN
OROMUCOSAL | Status: AC
  Filled 2024-06-30: qty 15

## 2024-06-30 MED ORDER — TRAMADOL HCL 50 MG PO TABS
25.0000 mg | ORAL_TABLET | Freq: Once | ORAL | Status: AC | PRN
  Administered 2024-06-30: 25 mg via ORAL

## 2024-06-30 MED ORDER — FENTANYL CITRATE (PF) 100 MCG/2ML IJ SOLN
INTRAMUSCULAR | Status: AC
Start: 2024-06-30 — End: 2024-06-30
  Filled 2024-06-30: qty 2

## 2024-06-30 MED ORDER — PROPOFOL 10 MG/ML IV BOLUS
INTRAVENOUS | Status: DC | PRN
  Administered 2024-06-30: 120 mg via INTRAVENOUS

## 2024-06-30 MED ORDER — MORPHINE SULFATE 4 MG/ML IJ SOLN: INTRAMUSCULAR | Status: DC | PRN

## 2024-06-30 MED ORDER — DEXMEDETOMIDINE HCL IN NACL 80 MCG/20ML IV SOLN
INTRAVENOUS | Status: DC | PRN
  Administered 2024-06-30: 8 ug via INTRAVENOUS

## 2024-06-30 MED ORDER — FENTANYL CITRATE (PF) 100 MCG/2ML IJ SOLN
25.0000 ug | INTRAMUSCULAR | Status: DC | PRN
  Administered 2024-06-30 (×2): 25 ug via INTRAVENOUS

## 2024-06-30 MED ORDER — ONDANSETRON HCL 4 MG PO TABS: 4.0000 mg | ORAL_TABLET | Freq: Four times a day (QID) | ORAL | Status: DC | PRN

## 2024-06-30 MED ORDER — PHENYLEPHRINE HCL-NACL 20-0.9 MG/250ML-% IV SOLN
INTRAVENOUS | Status: DC | PRN
  Administered 2024-06-30: 30 ug/min via INTRAVENOUS

## 2024-06-30 MED ORDER — PROPOFOL 10 MG/ML IV BOLUS
INTRAVENOUS | Status: AC
  Filled 2024-06-30: qty 20

## 2024-06-30 MED ORDER — HYDROCODONE-ACETAMINOPHEN 5-325 MG PO TABS: 1.0000 | ORAL_TABLET | ORAL | Status: DC | PRN

## 2024-06-30 MED ORDER — BUPIVACAINE-EPINEPHRINE (PF) 0.25% -1:200000 IJ SOLN
INTRAMUSCULAR | Status: AC
  Filled 2024-06-30: qty 30

## 2024-06-30 MED ORDER — ACETAMINOPHEN 10 MG/ML IV SOLN
INTRAVENOUS | Status: AC
  Filled 2024-06-30: qty 100

## 2024-06-30 MED ORDER — CHLORHEXIDINE GLUCONATE 0.12 % MT SOLN
15.0000 mL | Freq: Once | OROMUCOSAL | Status: AC
  Administered 2024-06-30: 15 mL via OROMUCOSAL

## 2024-06-30 MED ORDER — ORAL CARE MOUTH RINSE: 15.0000 mL | Freq: Once | OROMUCOSAL | Status: AC

## 2024-06-30 MED ORDER — MORPHINE SULFATE (PF) 4 MG/ML IV SOLN
INTRAVENOUS | Status: AC
  Filled 2024-06-30: qty 3

## 2024-06-30 MED ORDER — TRAMADOL HCL 50 MG PO TABS
ORAL_TABLET | ORAL | Status: AC
  Filled 2024-06-30: qty 1

## 2024-06-30 MED ORDER — DEXAMETHASONE SODIUM PHOSPHATE 10 MG/ML IJ SOLN
INTRAMUSCULAR | Status: DC | PRN
  Administered 2024-06-30: 4 mg via INTRAVENOUS

## 2024-06-30 MED ORDER — BUPIVACAINE-EPINEPHRINE 0.25% -1:200000 IJ SOLN
INTRAMUSCULAR | Status: DC | PRN
  Administered 2024-06-30: 5 mL

## 2024-06-30 MED ORDER — ACETAMINOPHEN 10 MG/ML IV SOLN
INTRAVENOUS | Status: DC | PRN
  Administered 2024-06-30: 1000 mg via INTRAVENOUS

## 2024-06-30 MED ORDER — PHENYLEPHRINE 80 MCG/ML (10ML) SYRINGE FOR IV PUSH (FOR BLOOD PRESSURE SUPPORT)
PREFILLED_SYRINGE | INTRAVENOUS | Status: DC | PRN
  Administered 2024-06-30 (×3): 80 ug via INTRAVENOUS

## 2024-06-30 MED ORDER — SODIUM CHLORIDE 0.9 % IV SOLN: INTRAVENOUS | Status: DC

## 2024-06-30 MED ORDER — RINGERS IRRIGATION IR SOLN
Status: DC | PRN
  Administered 2024-06-30: 6000 mL
  Administered 2024-06-30: 3000 mL

## 2024-06-30 MED ORDER — TRAMADOL HCL 50 MG PO TABS: 50.0000 mg | ORAL_TABLET | Freq: Four times a day (QID) | ORAL | 0 refills | Status: DC | PRN

## 2024-06-30 MED ORDER — MORPHINE SULFATE (PF) 2 MG/ML IV SOLN: 0.5000 mg | INTRAVENOUS | Status: DC | PRN

## 2024-06-30 SURGICAL SUPPLY — 26 items
ADAPTER IRRIG TUBE 2 SPIKE SOL (ADAPTER) ×2 IMPLANT
BLADE SHAVER 4.5 DBL SERAT CV (CUTTER) ×1 IMPLANT
CUFF TOURN DUAL QUICK 24 (TOURNIQUET CUFF) IMPLANT
CUFF TRNQT CYL 30X4X21-28X (TOURNIQUET CUFF) IMPLANT
DRAPE ARTHROSCOPY W/POUCH 90 (DRAPES) ×1 IMPLANT
DURAPREP 26ML APPLICATOR (WOUND CARE) ×1 IMPLANT
GAUZE SPONGE 4X4 12PLY STRL (GAUZE/BANDAGES/DRESSINGS) ×1 IMPLANT
GAUZE XEROFORM 1X8 LF (GAUZE/BANDAGES/DRESSINGS) ×1 IMPLANT
GLOVE BIO SURGEON STRL SZ7.5 (GLOVE) ×1 IMPLANT
GLOVE BIOGEL PI IND STRL 8 (GLOVE) ×2 IMPLANT
GOWN STRL REUS W/ TWL LRG LVL3 (GOWN DISPOSABLE) ×2 IMPLANT
IV LR IRRIG 3000ML ARTHROMATIC (IV SOLUTION) ×2 IMPLANT
KIT TURNOVER KIT A (KITS) ×1 IMPLANT
MANIFOLD NEPTUNE II (INSTRUMENTS) ×1 IMPLANT
PACK ARTHROSCOPY KNEE (MISCELLANEOUS) ×1 IMPLANT
PAD ARMBOARD POSITIONER FOAM (MISCELLANEOUS) ×1 IMPLANT
SHAVER BLADE TAPERED BLUNT 4 (BLADE) IMPLANT
STOCKINETTE BIAS CUT 6 980064 (GAUZE/BANDAGES/DRESSINGS) ×1 IMPLANT
SUT ETHILON 4 0 PS 2 18 (SUTURE) IMPLANT
SUTURE EHLN 3-0 FS-10 30 BLK (SUTURE) ×1 IMPLANT
TRAP FLUID SMOKE EVACUATOR (MISCELLANEOUS) ×1 IMPLANT
TUBE SET DOUBLEFLO INFLOW (TUBING) ×1 IMPLANT
TUBE SET DOUBLEFLO OUTFLOW (TUBING) ×1 IMPLANT
WAND HAND CNTRL MULTIVAC 50 (MISCELLANEOUS) ×1 IMPLANT
WATER STERILE IRR 500ML POUR (IV SOLUTION) ×1 IMPLANT
WRAP KNEE W/COLD PACKS 25.5X14 (SOFTGOODS) ×1 IMPLANT

## 2024-06-30 NOTE — Interval H&P Note (Signed)
 History and Physical Interval Note:  06/30/2024 11:12 AM  Morgan Moore  has presented today for surgery, with the diagnosis of Internal derangement of knee, left.  The various methods of treatment have been discussed with the patient and family. After consideration of risks, benefits and other options for treatment, the patient has consented to  Procedure(s): DEBRIDEMENT, MENISCUS, KNEE (Left) ARTHROSCOPY, KNEE, WITH MEDIAL MENISCECTOMY (Left) as a surgical intervention.  The patient's history has been reviewed, patient examined, no change in status, stable for surgery.  I have reviewed the patient's chart and labs.  Questions were answered to the patient's satisfaction.     Morgan Moore P Alicha Raspberry

## 2024-06-30 NOTE — Op Note (Signed)
 OPERATIVE NOTE  DATE OF SURGERY:  06/30/2024  PATIENT NAME:  Morgan Moore   DOB: May 16, 1956  MRN: [id number]   PRE-OPERATIVE DIAGNOSIS:  Internal derangement of the left knee   POST-OPERATIVE DIAGNOSIS: Tear of the posterior horn of the medial meniscus, left knee  PROCEDURE:  Left knee arthroscopy, partial medial meniscectomy  SURGEON:  Lynwood SHAUNNA Mardee Mickey., M.D.   ASSISTANT: none  ANESTHESIA: general  ESTIMATED BLOOD LOSS: Minimal  FLUIDS REPLACED: 700 mL of crystalloid  TOURNIQUET TIME: Not used  INDICATIONS FOR SURGERY: Osie Merkin is a 68 y.o. year old female who has been seen for complaints of left knee pain. MRI demonstrated findings consistent with meniscal pathology. After discussion of the risks and benefits of surgical intervention, the patient expressed understanding of the risks benefits and agree with plans for left knee arthroscopy.   PROCEDURE IN DETAIL: The patient was brought into the operating room and, after adequate general anesthesia was achieved, a tourniquet was applied to the left thigh and the leg was placed in the leg holder. All bony prominences were well padded. The patient's left knee was cleaned and prepped with alcohol and Duraprep and draped in the usual sterile fashion. A timeout was performed as per usual protocol. The anticipated portal sites were injected with 0.25% Marcaine  with epinephrine . An anterolateral incision was made and a cannula was inserted. A small effusion was evacuated and the knee was distended with fluid using the pump. The scope was advanced down the medial gutter into the medial compartment. Under visualization with the scope, an anteromedial portal was created and a hooked probe was inserted. The medial meniscus was visualized and probed.  There was a complex tear of the posterior horn of the medial meniscus with a flap that had flipped under the meniscus.  The tear was debrided using meniscal punches and an incisor  shaver.  Final contouring was performed using a 50 degree ArthroCare wand.  The remaining rim of meniscus was visualized and probed and felt to be stable.  The articular cartilage was visualized and felt to be in reasonably good condition.  The scope was then advanced into the intercondylar notch. The anterior cruciate ligament was visualized and probed and felt to be intact. The scope was removed from the lateral portal and reinserted via the anteromedial portal to better visualize the lateral compartment. The lateral meniscus was visualized and probed.  The lateral meniscus demonstrated minimal changes along the inner aspect and these areas were debrided using the ArthroCare wand.  The articular cartilage of the lateral compartment was visualized.  The articular surface was in good condition.  Finally, the scope was advanced so as to visualize the patellofemoral articulation. Good patellar tracking was appreciated.  Only mild chondromalacia was noted to the trochlear groove.  The knee was irrigated with copius amounts of fluid and suctioned dry. The anterolateral portal was re-approximated with #3-0 nylon. A combination of 0.25% Marcaine  with epinephrine  and 4 mg of Morphine  were injected via the scope. The scope was removed and the anteromedial portal was re-approximated with #3-0 nylon. A sterile dressing was applied followed by application of an ice wrap.  The patient tolerated the procedure well and was transported to the PACU in stable condition.  Hayes Czaja P. Lorisa Scheid, Jr., M.D.

## 2024-06-30 NOTE — Transfer of Care (Signed)
 Immediate Anesthesia Transfer of Care Note  Patient: Morgan Moore  Procedure(s) Performed: DEBRIDEMENT, MENISCUS, KNEE (Left: Knee) ARTHROSCOPY, KNEE, WITH MEDIAL MENISCECTOMY (Left: Knee)  Patient Location: PACU  Anesthesia Type:General  Level of Consciousness: drowsy  Airway & Oxygen Therapy: Patient Spontanous Breathing and Patient connected to face mask oxygen  Post-op Assessment: Report given to RN, Post -op Vital signs reviewed and stable, and Patient moving all extremities  Post vital signs: Reviewed and stable  Last Vitals:  Vitals Value Taken Time  BP 113/53 06/30/24 13:49  Temp    Pulse 62 06/30/24 13:50  Resp 17 06/30/24 13:50  SpO2 100 % 06/30/24 13:50  Vitals shown include unfiled device data.  Last Pain:  Vitals:   06/30/24 1105  TempSrc: Temporal  PainSc: 0-No pain       Patent airway to PACU, breathing spontaneously on 5L O2 via FM. Pt sleepy. VSS.  Complications: No notable events documented.

## 2024-07-03 ENCOUNTER — Encounter: Payer: Self-pay | Admitting: Orthopedic Surgery

## 2024-07-04 NOTE — Anesthesia Postprocedure Evaluation (Signed)
 Anesthesia Post Note  Patient: Marybell Robards  Procedure(s) Performed: DEBRIDEMENT, MENISCUS, KNEE (Left: Knee) ARTHROSCOPY, KNEE, WITH MEDIAL MENISCECTOMY (Left: Knee)  Patient location during evaluation: PACU Anesthesia Type: General Level of consciousness: awake and alert Pain management: pain level controlled Vital Signs Assessment: post-procedure vital signs reviewed and stable Respiratory status: spontaneous breathing, nonlabored ventilation and respiratory function stable Cardiovascular status: blood pressure returned to baseline and stable Postop Assessment: no apparent nausea or vomiting Anesthetic complications: no   No notable events documented.   Last Vitals:  Vitals:   06/30/24 1515 06/30/24 1540  BP: (!) 150/73 (!) 160/74  Pulse: (!) 57 (!) 59  Resp: 18 16  Temp:  (!) 36.1 C  SpO2: 100% 98%    Last Pain:  Vitals:   06/30/24 1540  TempSrc: Temporal  PainSc: 3                  Camellia Merilee Louder

## 2024-07-07 ENCOUNTER — Other Ambulatory Visit (INDEPENDENT_AMBULATORY_CARE_PROVIDER_SITE_OTHER)

## 2024-07-07 DIAGNOSIS — E7800 Pure hypercholesterolemia, unspecified: Secondary | ICD-10-CM | POA: Diagnosis not present

## 2024-07-07 LAB — LIPID PANEL
Cholesterol: 150 mg/dL (ref 0–200)
HDL: 47.7 mg/dL (ref >39.00)
LDL Cholesterol: 82 mg/dL (ref 0–99)
NonHDL: 102.75
Total CHOL/HDL Ratio: 3
Triglycerides: 102 mg/dL (ref 0.0–149.0)
VLDL: 20.4 mg/dL (ref 0.0–40.0)

## 2024-07-07 LAB — BASIC METABOLIC PANEL WITH GFR
BUN: 15 mg/dL (ref 6–23)
CO2: 28 meq/L (ref 19–32)
Calcium: 9.2 mg/dL (ref 8.4–10.5)
Chloride: 102 meq/L (ref 96–112)
Creatinine, Ser: 0.75 mg/dL (ref 0.40–1.20)
GFR: 81.25 mL/min (ref >60.00)
Glucose, Bld: 95 mg/dL (ref 70–99)
Potassium: 4 meq/L (ref 3.5–5.1)
Sodium: 140 meq/L (ref 135–145)

## 2024-07-07 LAB — HEPATIC FUNCTION PANEL
ALT: 43 U/L — ABNORMAL HIGH (ref 0–35)
AST: 27 U/L (ref 0–37)
Albumin: 4 g/dL (ref 3.5–5.2)
Alkaline Phosphatase: 94 U/L (ref 39–117)
Bilirubin, Direct: 0.1 mg/dL (ref 0.0–0.3)
Total Bilirubin: 0.5 mg/dL (ref 0.2–1.2)
Total Protein: 6.6 g/dL (ref 6.0–8.3)

## 2024-07-11 ENCOUNTER — Ambulatory Visit: Payer: Self-pay | Admitting: Internal Medicine

## 2024-07-11 ENCOUNTER — Encounter: Payer: Self-pay | Admitting: Internal Medicine

## 2024-07-11 ENCOUNTER — Ambulatory Visit: Admitting: Internal Medicine

## 2024-07-11 VITALS — BP 122/70 | HR 89 | Resp 16 | Ht 61.0 in | Wt 169.0 lb

## 2024-07-11 DIAGNOSIS — I700 Atherosclerosis of aorta: Secondary | ICD-10-CM | POA: Diagnosis not present

## 2024-07-11 DIAGNOSIS — I779 Disorder of arteries and arterioles, unspecified: Secondary | ICD-10-CM

## 2024-07-11 DIAGNOSIS — G4730 Sleep apnea, unspecified: Secondary | ICD-10-CM | POA: Diagnosis not present

## 2024-07-11 DIAGNOSIS — E7800 Pure hypercholesterolemia, unspecified: Secondary | ICD-10-CM

## 2024-07-11 DIAGNOSIS — F439 Reaction to severe stress, unspecified: Secondary | ICD-10-CM | POA: Diagnosis not present

## 2024-07-11 DIAGNOSIS — K50919 Crohn's disease, unspecified, with unspecified complications: Secondary | ICD-10-CM | POA: Diagnosis not present

## 2024-07-11 DIAGNOSIS — F320 Major depressive disorder, single episode, mild: Secondary | ICD-10-CM

## 2024-07-11 DIAGNOSIS — M25511 Pain in right shoulder: Secondary | ICD-10-CM | POA: Diagnosis not present

## 2024-07-11 DIAGNOSIS — K219 Gastro-esophageal reflux disease without esophagitis: Secondary | ICD-10-CM

## 2024-07-11 DIAGNOSIS — I10 Essential (primary) hypertension: Secondary | ICD-10-CM

## 2024-07-11 DIAGNOSIS — I25118 Atherosclerotic heart disease of native coronary artery with other forms of angina pectoris: Secondary | ICD-10-CM

## 2024-07-11 NOTE — Progress Notes (Signed)
 Subjective:    Patient ID: Morgan Moore, female    DOB: 24-Jul-1956, 68 y.o.   MRN: [id number]  Patient here for  Chief Complaint  Patient presents with   Medical Management of Chronic Issues    HPI Here for a scheduled follow up. S/p debridement meniscus left knee (left arthroscopy knee with medial meniscectomy) - 06/30/24. Has f/u with ortho today to get sutures removed. Had f/u with pulmonary 06/23/24 - f/u OSA. Continue cpap. Doing well - compliant. Had f/u with cardiology 05/01/24 - f/u regarding high blood pressure. Recent stress echo - ok. Renal artery ultrasound - ok. Cardiac MRI 01/2024 - normal.  Was started on spironolactone. Also instructed to take losartan 25mg  in am and verapamil  120mg  in the afternoon. Blood pressure doing better. Limited walking/ activity. Had questions about celebrex . On plavix . Hold on antiinflammatory medication. Right shoulder aggravated - using a walker around her surgery. Not using walker now. Wants to hold on any further intervention with her shoulder. Discussed labs. ALT slightly increased - 43. Remainder of liver panel wnl. Discussed cholesterol and other treatment options. Discussed leqvia.    Past Medical History:  Diagnosis Date   Allergy    Anxiety    Aortic atherosclerosis (HCC)    Atypical angina (HCC)    Bilateral carotid artery disease (HCC)    Chicken pox    Complication of anesthesia    a.) PONV   Coronary artery disease    a.) NSTEMI 06/08/2016 --> 80% pLCx (2.75 x 12 mm Resolute Onyx DES), 90% mRCA ( 2.25 x 22 mm Resolute Onyx DES); b.) LHC/PCI 05/07/2021: 90% pLAD (3.0 x 15 mm Onyx DES). 80% mRCA ( 2.5 x 22 mm Onyx DES); c.) NSTEMI 11/27/2021 --> 95% ISR LAD (2.75 x 12 Onyx Frontier)   Crohn disease (HCC)    Depression    DOE (dyspnea on exertion)    DVT (deep venous thrombosis) (HCC) 2017   Elevated lipoprotein(a)    GERD (gastroesophageal reflux disease)    Heart murmur    History of 2019 novel coronavirus disease  (COVID-19) 06/27/2021   History of kidney stones    Hyperlipidemia    Hypertension    IDA (iron deficiency anemia)    Inflammatory bowel disease    Insomnia    a.) uses melatonin PRN   Long term current use of clopidogrel     Long-term use of aspirin  therapy    Migraines    NSTEMI (non-ST elevated myocardial infarction) (HCC) 06/07/2016   a. LHC/PCI 06/08/2016: 40% LAD, 80% pLCx (2.75 x 12 mm Resolute Onyx DES), 90% mLCx (2.25 x 22 mm Resolute Onyx), 20% pRCA, 20% mRCA   NSTEMI (non-ST elevated myocardial infarction) (HCC) 11/27/2021   a.) LHC/PCI 11/28/2021: 95% ISR mLAD (2.75 x 12 Onyx Frontier DES)   OSA on CPAP    Osteoarthritis    Osteoporosis    Palpitation 2019   Paroxysmal SVT (supraventricular tachycardia) (HCC)    Phlebitis    PONV (postoperative nausea and vomiting)    S/P primary angioplasty with coronary stent    a.) TOTAL OF: 5 stents as of 06/29/2024   Shingles    Status post bilateral cataract extraction    Varicose veins of both lower extremities    Past Surgical History:  Procedure Laterality Date   BREAST BIOPSY Left 07/05/2019   FIBROEPITHELIAL lesion/neg   BREAST CYST ASPIRATION     unsure of side   CARPAL TUNNEL RELEASE Right 12/02/2017   Procedure: CARPAL TUNNEL  RELEASE ENDOSCOPIC;  Surgeon: Edie Norleen PARAS, MD;  Location: [facility];  Service: Orthopedics;  Laterality: Right;   CATARACT EXTRACTION Bilateral    CORONARY ANGIOPLASTY WITH STENT PLACEMENT Left 06/08/2016   Procedure: CORONARY ANGIOPLASTY WITH STENT PLACEMENT; Location: [facility]   CORONARY ANGIOPLASTY WITH STENT PLACEMENT Left 05/07/2021   Procedure:CORONARY ANGIOPLASTY WITH STENT PLACEMENT; Location: [facility]; Surgeon: Jerilynn Cleveland, MD   CORONARY ANGIOPLASTY WITH STENT PLACEMENT Left 11/28/2021   Procedure:CORONARY ANGIOPLASTY WITH STENT PLACEMENT; Location: [facility]; Surgeon: Karyle Dunk, MD   GANGLION CYST EXCISION Right 12/02/2017   Procedure: REMOVAL GANGLION OF WRIST;  Surgeon: Edie Norleen PARAS,  MD;  Location: [facility];  Service: Orthopedics;  Laterality: Right;   HYSTEROSCOPY WITH D & C N/A 04/19/2020   Procedure: DILATATION AND CURETTAGE /HYSTEROSCOPY, POSSIBLE  POLYPECTOMY;  Surgeon: Verdon Keen, MD;  Location: [facility];  Service: Gynecology;  Laterality: N/A;   KNEE ARTHROSCOPY WITH MEDIAL MENISECTOMY Left 06/30/2024   Procedure: ARTHROSCOPY, KNEE, WITH MEDIAL MENISCECTOMY;  Surgeon: Mardee Lynwood SQUIBB, MD;  Location: [facility];  Service: Orthopedics;  Laterality: Left;   LEFT HEART CATH AND CORONARY ANGIOGRAPHY Left 09/24/2014   Procedure: LEFT HEART CATH AND CORONARY ANGIOGRAPHY; Location: [facility]; Surgeon: Margie Lovelace, MD   LEFT HEART CATH AND CORONARY ANGIOGRAPHY Left 01/23/2022   Procedure: LEFT HEART CATH AND CORONARY ANGIOGRAPHY; Location: [facility]; Surgeon: Ozell Estelle, MD   MENISCUS DEBRIDEMENT Left 06/30/2024   Procedure: DEBRIDEMENT, MENISCUS, KNEE;  Surgeon: Mardee Lynwood SQUIBB, MD;  Location: [facility];  Service: Orthopedics;  Laterality: Left;   TONSILLECTOMY AND ADENOIDECTOMY  1962   TUBAL LIGATION  1993   Family History  Problem Relation Age of Onset   Heart disease Father    Heart disease Brother    Cancer Maternal Aunt    Breast cancer Maternal Aunt    Stroke Mother    Kidney disease Neg Hx    GU problems Neg Hx    Kidney cancer Neg Hx    Social History   Socioeconomic History   Marital status: Single    Spouse name: Not on file   Number of children: Not on file   Years of education: Not on file   Highest education level: Master's degree (e.g., MA, MS, MEng, MEd, MSW, MBA)  Occupational History   Not on file  Tobacco Use   Smoking status: Never   Smokeless tobacco: Never  Vaping Use   Vaping status: Never Used  Substance and Sexual Activity   Alcohol use: No    Alcohol/week: 0.0 standard drinks of alcohol   Drug use: No   Sexual activity: Not on file  Other Topics Concern   Not on file  Social History Narrative   Had 2 kids. Daughter died in  08/21/12 son living    Former Writer    Social Drivers of Health   Financial Resource Strain: Low Risk  (07/10/2024)   Overall Financial Resource Strain (CARDIA)    Difficulty of Paying Living Expenses: Not hard at all  Food Insecurity: No Food Insecurity (07/10/2024)   Hunger Vital Sign    Worried About Running Out of Food in the Last Year: Never true    Ran Out of Food in the Last Year: Never true  Transportation Needs: No Transportation Needs (07/10/2024)   PRAPARE - Administrator, Civil Service (Medical): No    Lack of Transportation (Non-Medical): No  Physical Activity: Insufficiently Active (07/10/2024)   Exercise Vital Sign    Days of Exercise  per Week: 2 days    Minutes of Exercise per Session: 50 min  Stress: No Stress Concern Present (07/10/2024)   [employer] of Occupational Health - Occupational Stress Questionnaire    Feeling of Stress: Not at all  Social Connections: Moderately Integrated (07/10/2024)   Social Connection and Isolation Panel    Frequency of Communication with Friends and Family: More than three times a week    Frequency of Social Gatherings with Friends and Family: Three times a week    Attends Religious Services: More than 4 times per year    Active Member of Clubs or Organizations: Yes    Attends Banker Meetings: More than 4 times per year    Marital Status: Divorced     Review of Systems  Constitutional:  Negative for appetite change and fever.  HENT:  Negative for congestion and sinus pressure.   Respiratory:  Negative for cough, chest tightness and shortness of breath.   Cardiovascular:  Negative for chest pain and palpitations.       No increased swelling.   Gastrointestinal:  Negative for abdominal pain, diarrhea, nausea and vomiting.  Genitourinary:  Negative for difficulty urinating and dysuria.  Musculoskeletal:  Negative for myalgias.       S/p knee surgery. Swelling has improved.   Skin:  Negative for  color change and rash.  Neurological:  Negative for dizziness and headaches.  Psychiatric/Behavioral:  Negative for agitation and dysphoric mood.        Objective:     BP 122/70   Pulse 89   Resp 16   Ht 5' 1 (1.549 m)   Wt 169 lb (76.7 kg)   SpO2 98%   BMI 31.93 kg/m  Wt Readings from Last 3 Encounters:  07/11/24 169 lb (76.7 kg)  06/30/24 167 lb (75.8 kg)  06/23/24 170 lb 3.2 oz (77.2 kg)    Physical Exam Vitals reviewed.  Constitutional:      General: She is not in acute distress.    Appearance: Normal appearance.  HENT:     Head: Normocephalic and atraumatic.     Right Ear: External ear normal.     Left Ear: External ear normal.  Eyes:     General: No scleral icterus.       Right eye: No discharge.        Left eye: No discharge.     Conjunctiva/sclera: Conjunctivae normal.  Neck:     Thyroid : No thyromegaly.  Cardiovascular:     Rate and Rhythm: Normal rate and regular rhythm.  Pulmonary:     Effort: No respiratory distress.     Breath sounds: Normal breath sounds. No wheezing.  Abdominal:     General: Bowel sounds are normal.     Palpations: Abdomen is soft.     Tenderness: There is no abdominal tenderness.  Musculoskeletal:        General: No tenderness.     Cervical back: Neck supple. No tenderness.     Comments: Resolving - bruising - knee. Decreased swelling   Lymphadenopathy:     Cervical: No cervical adenopathy.  Skin:    Findings: No erythema or rash.  Neurological:     Mental Status: She is alert.  Psychiatric:        Mood and Affect: Mood normal.        Behavior: Behavior normal.         Outpatient Encounter Medications as of 07/11/2024  Medication Sig   aspirin  EC  81 MG tablet Take 81 mg by mouth in the morning.   clopidogrel  (PLAVIX ) 75 MG tablet Take 75 mg by mouth in the morning.   cyanocobalamin  1000 MCG tablet Take 2,000 mcg by mouth daily at 12 noon.   diphenhydrAMINE (BENADRYL) 12.5 MG/5ML elixir Take 12.5 mg by mouth every  6 (six) hours as needed (allergic reaction).   EPINEPHrine  0.3 mg/0.3 mL IJ SOAJ injection Inject 0.3 mg into the muscle once.    escitalopram  (LEXAPRO ) 10 MG tablet Take 1 tablet (10 mg total) by mouth daily. (Patient taking differently: Take 10 mg by mouth every evening.)   losartan (COZAAR) 25 MG tablet Take 25 mg by mouth in the morning.   melatonin 3 MG TABS tablet Take 3 mg by mouth at bedtime.   mesalamine  (LIALDA ) 1.2 g EC tablet Take 1.2 g by mouth 3 (three) times daily with meals.    metroNIDAZOLE  (METROGEL ) 1 % gel Apply 1 Application topically daily as needed (skin reaction.).   nitroGLYCERIN  (NITROSTAT ) 0.4 MG SL tablet DISSOLVE (1) TABLET UNDER TONGUE AS NEEDED TO RELIEVE CHEST PAIN. MAYREPEAT EVERY 5 MINUTES. (Patient taking differently: Place 0.4 mg under the tongue every 5 (five) minutes x 3 doses as needed for chest pain. Usual dose is q 5 minutes x 3 doses.)   pantoprazole (PROTONIX) 20 MG tablet Take 20 mg by mouth in the morning.   prednisoLONE  acetate (PRED FORTE ) 1 % ophthalmic suspension Place 1 drop into both eyes 2 (two) times daily as needed (blepharitis (eye inflammation)).   REPATHA 140 MG/ML SOSY Inject 140 mg into the skin every 14 (fourteen) days.   spironolactone (ALDACTONE) 25 MG tablet Take 12.5 mg by mouth in the morning.   traMADol  (ULTRAM ) 50 MG tablet Take 1 tablet (50 mg total) by mouth every 6 (six) hours as needed for moderate pain (pain score 4-6).   verapamil  (VERELAN ) 120 MG 24 hr capsule Take 120 mg by mouth every evening. (1700)   No facility-administered encounter medications on file as of 07/11/2024.     Lab Results  Component Value Date   WBC 8.1 06/23/2024   HGB 12.4 06/23/2024   HCT 36.0 06/23/2024   PLT 293 06/23/2024   GLUCOSE 95 07/07/2024   CHOL 150 07/07/2024   TRIG 102.0 07/07/2024   HDL 47.70 07/07/2024   LDLCALC 82 07/07/2024   ALT 43 (H) 07/07/2024   AST 27 07/07/2024   NA 140 07/07/2024   K 4.0 07/07/2024   CL 102 07/07/2024    CREATININE 0.75 07/07/2024   BUN 15 07/07/2024   CO2 28 07/07/2024   TSH 2.85 03/03/2024   INR 1.0 12/23/2017       Assessment & Plan:  Stress Assessment & Plan: Continue lexapro . Overall appears to be handling things relatively well.    Sleep apnea, unspecified type Assessment & Plan: Seeing Dr Isaiah. Continue cpap.    Right shoulder pain, unspecified chronicity Assessment & Plan: Aggravated from using a walker. Not using now. Wants to follow. Wants to hold on any further evaluation or treatment.    Major depressive disorder, single episode, mild (HCC) Assessment & Plan: Overall appears to be handling things relatively well. Continue lexapro .    Aortic atherosclerosis (HCC) Assessment & Plan: Continues on repatha. Discussed treatment options. Discussed leqvia.    Carotid artery disease, unspecified laterality, unspecified type [facility]) Assessment & Plan: Continue repatha and aspirin .  Continue risk factor modification.  Saw Dr Jama 04/2022 - Given the patient's asymptomatic subcritical  stenosis no further invasive testing or surgery recommended. Duplex ultrasound shows 1-39% stenosis bilaterally (04/08/23). Continue antiplatelet therapy as prescribed Continue management of CAD, HTN and Hyperlipidemia. Follow up in 12 months with duplex ultrasound and physical exam. Is scheduled for f/u. Had f/u carotid ultrasound 03/2024. Continue f/u with [facility].    Coronary artery disease of native artery of native heart with stable angina pectoris [facility]) Assessment & Plan: Continue repaha and aspirin .  Continue risk factor modification. Followed by cardiology. Currently stable.    Crohn's disease with complication, unspecified gastrointestinal tract location [facility]) Assessment & Plan: Has been on lialda .  Bowels have been relatively stable.  Stable. Continue f/u with GI.    Essential hypertension Assessment & Plan: Blood pressure as outlined. New medication regimen - spironolactone,  losartan and verapamil  - as outlined. Follow pressures. Follow metabolic panel.    Gastroesophageal reflux disease, unspecified whether esophagitis present Assessment & Plan: Continue protonix. No upper symptoms reported.    Hypercholesterolemia Assessment & Plan: Continues on repatha. Discussed further treatment as outlined.       Allena Hamilton, MD

## 2024-07-14 DIAGNOSIS — G4733 Obstructive sleep apnea (adult) (pediatric): Secondary | ICD-10-CM | POA: Diagnosis not present

## 2024-07-16 ENCOUNTER — Encounter: Payer: Self-pay | Admitting: Internal Medicine

## 2024-07-16 NOTE — Assessment & Plan Note (Signed)
 Continues on repatha. Discussed treatment options. Discussed leqvia.

## 2024-07-16 NOTE — Assessment & Plan Note (Signed)
Continue protonix.  No upper symptoms reported.

## 2024-07-16 NOTE — Assessment & Plan Note (Signed)
 Overall appears to be handling things relatively well.  Continue lexapro.

## 2024-07-16 NOTE — Assessment & Plan Note (Signed)
 Aggravated from using a walker. Not using now. Wants to follow. Wants to hold on any further evaluation or treatment.

## 2024-07-16 NOTE — Assessment & Plan Note (Signed)
 Has been on lialda .  Bowels have been relatively stable.  Stable. Continue f/u with GI.

## 2024-07-16 NOTE — Assessment & Plan Note (Signed)
 Continue lexapro . Overall appears to be handling things relatively well.

## 2024-07-16 NOTE — Assessment & Plan Note (Signed)
 Continue repaha and aspirin .  Continue risk factor modification. Followed by cardiology. Currently stable.

## 2024-07-16 NOTE — Assessment & Plan Note (Signed)
 Continue repatha and aspirin .  Continue risk factor modification.  Saw Dr Jama 04/2022 - Given the patient's asymptomatic subcritical stenosis no further invasive testing or surgery recommended. Duplex ultrasound shows 1-39% stenosis bilaterally (04/08/23). Continue antiplatelet therapy as prescribed Continue management of CAD, HTN and Hyperlipidemia. Follow up in 12 months with duplex ultrasound and physical exam. Is scheduled for f/u. Had f/u carotid ultrasound 03/2024. Continue f/u with [facility].

## 2024-07-16 NOTE — Assessment & Plan Note (Signed)
 Seeing Dr Isaiah. Continue cpap.

## 2024-07-16 NOTE — Assessment & Plan Note (Signed)
 Blood pressure as outlined. New medication regimen - spironolactone, losartan and verapamil  - as outlined. Follow pressures. Follow metabolic panel.

## 2024-07-16 NOTE — Assessment & Plan Note (Signed)
 Continues on repatha. Discussed further treatment as outlined.

## 2024-07-17 DIAGNOSIS — R2689 Other abnormalities of gait and mobility: Secondary | ICD-10-CM | POA: Diagnosis not present

## 2024-07-17 DIAGNOSIS — M25662 Stiffness of left knee, not elsewhere classified: Secondary | ICD-10-CM | POA: Diagnosis not present

## 2024-07-17 DIAGNOSIS — M25562 Pain in left knee: Secondary | ICD-10-CM | POA: Diagnosis not present

## 2024-07-18 ENCOUNTER — Other Ambulatory Visit: Payer: Self-pay

## 2024-07-18 ENCOUNTER — Telehealth: Payer: Self-pay | Admitting: Internal Medicine

## 2024-07-18 DIAGNOSIS — R7989 Other specified abnormal findings of blood chemistry: Secondary | ICD-10-CM

## 2024-07-18 NOTE — Telephone Encounter (Signed)
 Order placed

## 2024-07-18 NOTE — Telephone Encounter (Signed)
 Lab order needed

## 2024-07-21 DIAGNOSIS — M25562 Pain in left knee: Secondary | ICD-10-CM | POA: Diagnosis not present

## 2024-07-21 DIAGNOSIS — R2689 Other abnormalities of gait and mobility: Secondary | ICD-10-CM | POA: Diagnosis not present

## 2024-07-21 DIAGNOSIS — M25662 Stiffness of left knee, not elsewhere classified: Secondary | ICD-10-CM | POA: Diagnosis not present

## 2024-07-24 DIAGNOSIS — H903 Sensorineural hearing loss, bilateral: Secondary | ICD-10-CM | POA: Diagnosis not present

## 2024-07-25 ENCOUNTER — Ambulatory Visit (INDEPENDENT_AMBULATORY_CARE_PROVIDER_SITE_OTHER): Admitting: Nurse Practitioner

## 2024-07-26 DIAGNOSIS — M25662 Stiffness of left knee, not elsewhere classified: Secondary | ICD-10-CM | POA: Diagnosis not present

## 2024-07-26 DIAGNOSIS — R2689 Other abnormalities of gait and mobility: Secondary | ICD-10-CM | POA: Diagnosis not present

## 2024-07-26 DIAGNOSIS — M25562 Pain in left knee: Secondary | ICD-10-CM | POA: Diagnosis not present

## 2024-07-28 DIAGNOSIS — M25662 Stiffness of left knee, not elsewhere classified: Secondary | ICD-10-CM | POA: Diagnosis not present

## 2024-07-28 DIAGNOSIS — M25562 Pain in left knee: Secondary | ICD-10-CM | POA: Diagnosis not present

## 2024-07-28 DIAGNOSIS — R2689 Other abnormalities of gait and mobility: Secondary | ICD-10-CM | POA: Diagnosis not present

## 2024-07-31 ENCOUNTER — Other Ambulatory Visit: Payer: Self-pay | Admitting: Internal Medicine

## 2024-07-31 ENCOUNTER — Encounter: Payer: Self-pay | Admitting: Orthopedic Surgery

## 2024-07-31 DIAGNOSIS — E7841 Elevated Lipoprotein(a): Secondary | ICD-10-CM | POA: Diagnosis not present

## 2024-07-31 DIAGNOSIS — Z95820 Peripheral vascular angioplasty status with implants and grafts: Secondary | ICD-10-CM | POA: Diagnosis not present

## 2024-07-31 DIAGNOSIS — I2510 Atherosclerotic heart disease of native coronary artery without angina pectoris: Secondary | ICD-10-CM | POA: Diagnosis not present

## 2024-07-31 DIAGNOSIS — Z1231 Encounter for screening mammogram for malignant neoplasm of breast: Secondary | ICD-10-CM

## 2024-07-31 DIAGNOSIS — I10 Essential (primary) hypertension: Secondary | ICD-10-CM | POA: Diagnosis not present

## 2024-07-31 DIAGNOSIS — I779 Disorder of arteries and arterioles, unspecified: Secondary | ICD-10-CM | POA: Diagnosis not present

## 2024-08-01 ENCOUNTER — Encounter (INDEPENDENT_AMBULATORY_CARE_PROVIDER_SITE_OTHER): Payer: Self-pay | Admitting: Nurse Practitioner

## 2024-08-01 ENCOUNTER — Other Ambulatory Visit (INDEPENDENT_AMBULATORY_CARE_PROVIDER_SITE_OTHER)

## 2024-08-01 ENCOUNTER — Ambulatory Visit (INDEPENDENT_AMBULATORY_CARE_PROVIDER_SITE_OTHER): Admitting: Nurse Practitioner

## 2024-08-01 VITALS — BP 116/69 | HR 76 | Resp 16 | Ht 61.0 in | Wt 172.6 lb

## 2024-08-01 DIAGNOSIS — R7989 Other specified abnormal findings of blood chemistry: Secondary | ICD-10-CM | POA: Diagnosis not present

## 2024-08-01 DIAGNOSIS — I8312 Varicose veins of left lower extremity with inflammation: Secondary | ICD-10-CM

## 2024-08-01 DIAGNOSIS — I8311 Varicose veins of right lower extremity with inflammation: Secondary | ICD-10-CM

## 2024-08-01 DIAGNOSIS — I83819 Varicose veins of unspecified lower extremities with pain: Secondary | ICD-10-CM | POA: Diagnosis not present

## 2024-08-01 LAB — HEPATIC FUNCTION PANEL
ALT: 21 U/L (ref 0–35)
AST: 19 U/L (ref 0–37)
Albumin: 4.2 g/dL (ref 3.5–5.2)
Alkaline Phosphatase: 80 U/L (ref 39–117)
Bilirubin, Direct: 0.1 mg/dL (ref 0.0–0.3)
Total Bilirubin: 0.5 mg/dL (ref 0.2–1.2)
Total Protein: 6.9 g/dL (ref 6.0–8.3)

## 2024-08-01 NOTE — Progress Notes (Signed)
 Varicose veins of bilateral  lower extremity with inflammation (454.1  I83.10) Current Plans   Indication: Patient presents with symptomatic varicose veins of the bilateral  lower extremity.   Procedure: Sclerotherapy using hypertonic saline mixed with 1% Lidocaine was performed on the bilateral lower extremity. Compression wraps were placed. The patient tolerated the procedure well.

## 2024-08-02 ENCOUNTER — Ambulatory Visit: Payer: Self-pay | Admitting: Internal Medicine

## 2024-08-04 DIAGNOSIS — R2689 Other abnormalities of gait and mobility: Secondary | ICD-10-CM | POA: Diagnosis not present

## 2024-08-04 DIAGNOSIS — M25562 Pain in left knee: Secondary | ICD-10-CM | POA: Diagnosis not present

## 2024-08-04 DIAGNOSIS — M25662 Stiffness of left knee, not elsewhere classified: Secondary | ICD-10-CM | POA: Diagnosis not present

## 2024-08-09 DIAGNOSIS — M25662 Stiffness of left knee, not elsewhere classified: Secondary | ICD-10-CM | POA: Diagnosis not present

## 2024-08-09 DIAGNOSIS — M25562 Pain in left knee: Secondary | ICD-10-CM | POA: Diagnosis not present

## 2024-08-09 DIAGNOSIS — R2689 Other abnormalities of gait and mobility: Secondary | ICD-10-CM | POA: Diagnosis not present

## 2024-08-10 DIAGNOSIS — Z9889 Other specified postprocedural states: Secondary | ICD-10-CM | POA: Diagnosis not present

## 2024-08-11 DIAGNOSIS — M25562 Pain in left knee: Secondary | ICD-10-CM | POA: Diagnosis not present

## 2024-08-11 DIAGNOSIS — R2689 Other abnormalities of gait and mobility: Secondary | ICD-10-CM | POA: Diagnosis not present

## 2024-08-11 DIAGNOSIS — M25662 Stiffness of left knee, not elsewhere classified: Secondary | ICD-10-CM | POA: Diagnosis not present

## 2024-08-14 DIAGNOSIS — R2689 Other abnormalities of gait and mobility: Secondary | ICD-10-CM | POA: Diagnosis not present

## 2024-08-14 DIAGNOSIS — M25662 Stiffness of left knee, not elsewhere classified: Secondary | ICD-10-CM | POA: Diagnosis not present

## 2024-08-14 DIAGNOSIS — M25562 Pain in left knee: Secondary | ICD-10-CM | POA: Diagnosis not present

## 2024-08-18 ENCOUNTER — Ambulatory Visit (INDEPENDENT_AMBULATORY_CARE_PROVIDER_SITE_OTHER): Admitting: Nurse Practitioner

## 2024-08-22 ENCOUNTER — Ambulatory Visit
Admission: RE | Admit: 2024-08-22 | Discharge: 2024-08-22 | Disposition: A | Discharge status: Home / Self Care | Source: Ambulatory Visit | Attending: Internal Medicine | Admitting: Internal Medicine

## 2024-08-22 DIAGNOSIS — Z1231 Encounter for screening mammogram for malignant neoplasm of breast: Secondary | ICD-10-CM | POA: Insufficient documentation

## 2024-08-22 DIAGNOSIS — M25662 Stiffness of left knee, not elsewhere classified: Secondary | ICD-10-CM | POA: Diagnosis not present

## 2024-08-22 DIAGNOSIS — R2689 Other abnormalities of gait and mobility: Secondary | ICD-10-CM | POA: Diagnosis not present

## 2024-08-22 DIAGNOSIS — M25562 Pain in left knee: Secondary | ICD-10-CM | POA: Diagnosis not present

## 2024-08-29 ENCOUNTER — Encounter (INDEPENDENT_AMBULATORY_CARE_PROVIDER_SITE_OTHER): Payer: Self-pay | Admitting: Nurse Practitioner

## 2024-08-29 ENCOUNTER — Ambulatory Visit (INDEPENDENT_AMBULATORY_CARE_PROVIDER_SITE_OTHER): Admitting: Nurse Practitioner

## 2024-08-29 VITALS — BP 145/71 | HR 61 | Resp 18 | Ht 61.0 in | Wt 173.6 lb

## 2024-08-29 DIAGNOSIS — I83819 Varicose veins of unspecified lower extremities with pain: Secondary | ICD-10-CM | POA: Diagnosis not present

## 2024-08-29 DIAGNOSIS — M25662 Stiffness of left knee, not elsewhere classified: Secondary | ICD-10-CM | POA: Diagnosis not present

## 2024-08-29 DIAGNOSIS — M25562 Pain in left knee: Secondary | ICD-10-CM | POA: Diagnosis not present

## 2024-08-29 DIAGNOSIS — R2689 Other abnormalities of gait and mobility: Secondary | ICD-10-CM | POA: Diagnosis not present

## 2024-08-29 DIAGNOSIS — I8311 Varicose veins of right lower extremity with inflammation: Secondary | ICD-10-CM

## 2024-08-29 DIAGNOSIS — I8312 Varicose veins of left lower extremity with inflammation: Secondary | ICD-10-CM | POA: Diagnosis not present

## 2024-08-29 NOTE — Progress Notes (Signed)
 Varicose veins of bilateral  lower extremity with inflammation (454.1  I83.10) Current Plans   Indication: Patient presents with symptomatic varicose veins of the bilateral  lower extremity.   Procedure: Sclerotherapy using hypertonic saline mixed with 1% Lidocaine was performed on the bilateral lower extremity. Compression wraps were placed. The patient tolerated the procedure well.

## 2024-09-05 DIAGNOSIS — M25562 Pain in left knee: Secondary | ICD-10-CM | POA: Diagnosis not present

## 2024-09-05 DIAGNOSIS — R2689 Other abnormalities of gait and mobility: Secondary | ICD-10-CM | POA: Diagnosis not present

## 2024-09-05 DIAGNOSIS — M25662 Stiffness of left knee, not elsewhere classified: Secondary | ICD-10-CM | POA: Diagnosis not present

## 2024-09-11 DIAGNOSIS — E785 Hyperlipidemia, unspecified: Secondary | ICD-10-CM | POA: Diagnosis not present

## 2024-09-11 DIAGNOSIS — F325 Major depressive disorder, single episode, in full remission: Secondary | ICD-10-CM | POA: Diagnosis not present

## 2024-09-11 DIAGNOSIS — I471 Supraventricular tachycardia, unspecified: Secondary | ICD-10-CM | POA: Diagnosis not present

## 2024-09-11 DIAGNOSIS — M1990 Unspecified osteoarthritis, unspecified site: Secondary | ICD-10-CM | POA: Diagnosis not present

## 2024-09-11 DIAGNOSIS — I700 Atherosclerosis of aorta: Secondary | ICD-10-CM | POA: Diagnosis not present

## 2024-09-11 DIAGNOSIS — I25119 Atherosclerotic heart disease of native coronary artery with unspecified angina pectoris: Secondary | ICD-10-CM | POA: Diagnosis not present

## 2024-09-11 DIAGNOSIS — I130 Hypertensive heart and chronic kidney disease with heart failure and stage 1 through stage 4 chronic kidney disease, or unspecified chronic kidney disease: Secondary | ICD-10-CM | POA: Diagnosis not present

## 2024-09-11 DIAGNOSIS — I739 Peripheral vascular disease, unspecified: Secondary | ICD-10-CM | POA: Diagnosis not present

## 2024-09-11 DIAGNOSIS — I509 Heart failure, unspecified: Secondary | ICD-10-CM | POA: Diagnosis not present

## 2024-09-12 DIAGNOSIS — R2689 Other abnormalities of gait and mobility: Secondary | ICD-10-CM | POA: Diagnosis not present

## 2024-09-12 DIAGNOSIS — M25662 Stiffness of left knee, not elsewhere classified: Secondary | ICD-10-CM | POA: Diagnosis not present

## 2024-09-12 DIAGNOSIS — M25562 Pain in left knee: Secondary | ICD-10-CM | POA: Diagnosis not present

## 2024-09-13 DIAGNOSIS — K219 Gastro-esophageal reflux disease without esophagitis: Secondary | ICD-10-CM | POA: Diagnosis not present

## 2024-09-13 DIAGNOSIS — K5010 Crohn's disease of large intestine without complications: Secondary | ICD-10-CM | POA: Diagnosis not present

## 2024-09-13 DIAGNOSIS — G4733 Obstructive sleep apnea (adult) (pediatric): Secondary | ICD-10-CM | POA: Diagnosis not present

## 2024-09-27 ENCOUNTER — Encounter (INDEPENDENT_AMBULATORY_CARE_PROVIDER_SITE_OTHER): Payer: Self-pay | Admitting: Nurse Practitioner

## 2024-09-27 ENCOUNTER — Ambulatory Visit (INDEPENDENT_AMBULATORY_CARE_PROVIDER_SITE_OTHER): Admitting: Nurse Practitioner

## 2024-09-27 VITALS — BP 138/64 | HR 85 | Resp 16 | Ht 61.0 in | Wt 173.0 lb

## 2024-09-27 DIAGNOSIS — I8312 Varicose veins of left lower extremity with inflammation: Secondary | ICD-10-CM

## 2024-09-27 DIAGNOSIS — I8311 Varicose veins of right lower extremity with inflammation: Secondary | ICD-10-CM | POA: Diagnosis not present

## 2024-09-27 DIAGNOSIS — I83819 Varicose veins of unspecified lower extremities with pain: Secondary | ICD-10-CM | POA: Diagnosis not present

## 2024-09-27 NOTE — Progress Notes (Signed)
 Varicose veins of bilateral  lower extremity with inflammation (454.1  I83.10) Current Plans   Indication: Patient presents with symptomatic varicose veins of the bilateral  lower extremity.   Procedure: Sclerotherapy using hypertonic saline mixed with 1% Lidocaine was performed on the bilateral lower extremity. Compression wraps were placed. The patient tolerated the procedure well.

## 2024-10-09 ENCOUNTER — Telehealth: Payer: Self-pay

## 2024-10-09 NOTE — Telephone Encounter (Signed)
 Copied from CRM #[id number]. Topic: Clinical - Medical Advice >> Oct 09, 2024  9:17 AM Morgan Moore wrote: Reason for CRM: Patient called.. needs a call back about her infusion.. 12/4 ( she wants to know if she needs something on hand for any allergic reaction.[name] looking for advice )  and is Looking prescription cream - she said used OTC Terbinafine for possible Ringworm per pharmacy.. but is looking for an actual script for it - the location is on her chest.. Pls call her back.

## 2024-10-11 NOTE — Telephone Encounter (Unsigned)
 Copied from CRM #[id number]. Topic: Clinical - Medical Advice >> Oct 09, 2024  9:17 AM Emylou G wrote: Reason for CRM: Patient called.. needs a call back about her infusion.. 12/4 ( she wants to know if she needs something on hand for any allergic reaction.[name] looking for advice )  and is Looking prescription cream - she said used OTC Terbinafine for possible Ringworm per pharmacy.. but is looking for an actual script for it - the location is on her chest.. Pls call her back. >> Oct 10, 2024  4:56 PM Sophia H wrote: Patient is following up on the RX request - ointment for ringworm. Patient stated she was told she would get a call back yesterday and never heard back from clinic. Please reach out. # [phone]. Has been using ointment for 5 weeks now and hasn't gone away. Patient spoke with Dr. Kathey office already regarding the infusion.   [employer] [employer] - [place], [place] - [address]

## 2024-10-11 NOTE — Telephone Encounter (Signed)
 There were two parts to this message. She was inquiring if she needed something for a possible allergic reaction for an infusion. What infusion is she talking about and who is giving. May need to contact them and also may need to let them know about rash prior to infusion.

## 2024-10-11 NOTE — Telephone Encounter (Signed)
 Lvm okay to relay to pt mention rash to infusion clinic as well  Thanks

## 2024-10-13 DIAGNOSIS — G4733 Obstructive sleep apnea (adult) (pediatric): Secondary | ICD-10-CM | POA: Diagnosis not present

## 2024-10-16 NOTE — Telephone Encounter (Signed)
 Spoke to pt. Pt wanted to know if appt was needed. Advised pt that yes an appt is need for a prescription.

## 2024-10-16 NOTE — Telephone Encounter (Unsigned)
 Copied from CRM #[id number]. Topic: General - Other >> Oct 16, 2024 10:11 AM Brittany M wrote: Reason for CRM: Patient asking for Norine Skelton, CMA to return call when available

## 2024-10-19 ENCOUNTER — Ambulatory Visit: Admitting: Nurse Practitioner

## 2024-10-19 ENCOUNTER — Encounter: Payer: Self-pay | Admitting: Nurse Practitioner

## 2024-10-19 VITALS — BP 132/82 | HR 72 | Temp 97.7°F | Ht 61.0 in | Wt 173.4 lb

## 2024-10-19 DIAGNOSIS — R21 Rash and other nonspecific skin eruption: Secondary | ICD-10-CM | POA: Diagnosis not present

## 2024-10-19 MED ORDER — CLOTRIMAZOLE 1 % EX CREA: 1.0000 | TOPICAL_CREAM | Freq: Two times a day (BID) | CUTANEOUS | 0 refills | Status: AC

## 2024-10-19 NOTE — Progress Notes (Unsigned)
 Established Patient Office Visit  Subjective:  Patient ID: Morgan Moore, female    DOB: [birth year]  Age: 68 y.o. MRN: [id number]  CC:  Chief Complaint  Patient presents with   Acute Visit    Possible ring worm around right breast x 5 weeks Using Terbafina cream from Pharmacy   Discussed the use of AI scribe software for clinical note transcription with the patient, who gave verbal consent to proceed.  History of Present Illness   Discussed the use of AI scribe software for clinical note transcription with the patient, who gave verbal consent to proceed.  History of Present Illness   Morgan Moore is a 68 year old female who presents with a persistent skin rash.  She has had a pruritic rash for about five weeks on the right upper chest and inframammary area. It began about fingernail size and has not significantly enlarged. It has small blisters and dry flaking without discharge. An over-the-counter antifungal cream twice daily controls but does not clear it, and if she stops for two to three days the area becomes tingly and itchy again. She denies fever.  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  She was on Repatha for several years and recently started a new infusion therapy in its place, with a bruise at the infusion site but no associated rash.       Past Medical History:  Diagnosis Date   Allergy    Anxiety    Aortic atherosclerosis    Atypical angina    Bilateral carotid artery disease    Chicken pox    Complication of anesthesia    a.) PONV   Coronary artery disease    a.) NSTEMI 06/08/2016 --> 80% pLCx (2.75 x 12 mm Resolute Onyx DES), 90% mRCA ( 2.25 x 22 mm Resolute Onyx DES); b.) LHC/PCI 05/07/2021: 90% pLAD (3.0 x 15 mm Onyx DES). 80% mRCA ( 2.5 x 22 mm Onyx DES); c.) NSTEMI 11/27/2021 --> 95% ISR LAD (2.75 x 12 Onyx Frontier)   Crohn disease (HCC)    Depression    DOE (dyspnea on exertion)     DVT (deep venous thrombosis) (HCC) 2017   Elevated lipoprotein(a)    GERD (gastroesophageal reflux disease)    Heart murmur    History of 2019 novel coronavirus disease (COVID-19) 06/27/2021   History of kidney stones    Hyperlipidemia    Hypertension    IDA (iron deficiency anemia)    Inflammatory bowel disease    Insomnia    a.) uses melatonin PRN   Long term current use of clopidogrel     Long-term use of aspirin  therapy    Migraines    NSTEMI (non-ST elevated myocardial infarction) (HCC) 06/07/2016   a. LHC/PCI 06/08/2016: 40% LAD, 80% pLCx (2.75 x 12 mm Resolute Onyx DES), 90% mLCx (2.25 x 22 mm Resolute Onyx), 20% pRCA, 20% mRCA   NSTEMI (non-ST elevated myocardial infarction) (HCC) 11/27/2021   a.) LHC/PCI 11/28/2021: 95% ISR mLAD (2.75 x 12 Onyx Frontier DES)   OSA on CPAP    Osteoarthritis    Osteoporosis    Palpitation 2019   Paroxysmal SVT (supraventricular tachycardia)    Phlebitis    PONV (postoperative nausea and vomiting)    S/P primary angioplasty with coronary stent    a.) TOTAL OF: 5 stents as of 06/29/2024   Shingles    Status post bilateral cataract extraction  Varicose veins of both lower extremities     Past Surgical History:  Procedure Laterality Date   BREAST BIOPSY Left 07/05/2019   FIBROEPITHELIAL lesion/neg   BREAST CYST ASPIRATION     unsure of side   CARPAL TUNNEL RELEASE Right 12/02/2017   Procedure: CARPAL TUNNEL RELEASE ENDOSCOPIC;  Surgeon: Edie Norleen PARAS, MD;  Location: [facility];  Service: Orthopedics;  Laterality: Right;   CATARACT EXTRACTION Bilateral    CORONARY ANGIOPLASTY WITH STENT PLACEMENT Left 06/08/2016   Procedure: CORONARY ANGIOPLASTY WITH STENT PLACEMENT; Location: [facility]   CORONARY ANGIOPLASTY WITH STENT PLACEMENT Left 05/07/2021   Procedure:CORONARY ANGIOPLASTY WITH STENT PLACEMENT; Location: [facility]; Surgeon: Jerilynn Cleveland, MD   CORONARY ANGIOPLASTY WITH STENT PLACEMENT Left 11/28/2021   Procedure:CORONARY ANGIOPLASTY  WITH STENT PLACEMENT; Location: [facility]; Surgeon: Karyle Dunk, MD   GANGLION CYST EXCISION Right 12/02/2017   Procedure: REMOVAL GANGLION OF WRIST;  Surgeon: Edie Norleen PARAS, MD;  Location: [facility];  Service: Orthopedics;  Laterality: Right;   HYSTEROSCOPY WITH D & C N/A 04/19/2020   Procedure: DILATATION AND CURETTAGE /HYSTEROSCOPY, POSSIBLE  POLYPECTOMY;  Surgeon: Verdon Keen, MD;  Location: [facility];  Service: Gynecology;  Laterality: N/A;   KNEE ARTHROSCOPY WITH MEDIAL MENISECTOMY Left 06/30/2024   Procedure: ARTHROSCOPY, KNEE, WITH MEDIAL MENISCECTOMY;  Surgeon: Mardee Lynwood SQUIBB, MD;  Location: [facility];  Service: Orthopedics;  Laterality: Left;   LEFT HEART CATH AND CORONARY ANGIOGRAPHY Left 09/24/2014   Procedure: LEFT HEART CATH AND CORONARY ANGIOGRAPHY; Location: [facility]; Surgeon: Margie Lovelace, MD   LEFT HEART CATH AND CORONARY ANGIOGRAPHY Left 01/23/2022   Procedure: LEFT HEART CATH AND CORONARY ANGIOGRAPHY; Location: [facility]; Surgeon: Ozell Estelle, MD   MENISCUS DEBRIDEMENT Left 06/30/2024   Procedure: DEBRIDEMENT, MENISCUS, KNEE;  Surgeon: Mardee Lynwood SQUIBB, MD;  Location: [facility];  Service: Orthopedics;  Laterality: Left;   TONSILLECTOMY AND ADENOIDECTOMY  1962   TUBAL LIGATION  1993    Family History  Problem Relation Age of Onset   Heart disease Father    Heart disease Brother    Cancer Maternal Aunt    Breast cancer Maternal Aunt    Stroke Mother    Kidney disease Neg Hx    GU problems Neg Hx    Kidney cancer Neg Hx     Social History   Socioeconomic History   Marital status: Single    Spouse name: Not on file   Number of children: Not on file   Years of education: Not on file   Highest education level: Master's degree (e.g., MA, MS, MEng, MEd, MSW, MBA)  Occupational History   Not on file  Tobacco Use   Smoking status: Never   Smokeless tobacco: Never  Vaping Use   Vaping status: Never Used  Substance and Sexual Activity   Alcohol use: No    Alcohol/week:  0.0 standard drinks of alcohol   Drug use: No   Sexual activity: Not on file  Other Topics Concern   Not on file  Social History Narrative   Had 2 kids. Daughter died in 12-Nov-2012 son living    Former writer    Social Drivers of Health   Tobacco Use: Low Risk (10/19/2024)   Patient History    Smoking Tobacco Use: Never    Smokeless Tobacco Use: Never    Passive Exposure: Not on file  Financial Resource Strain: Low Risk (10/15/2024)   Overall Financial Resource Strain (CARDIA)    Difficulty of Paying Living Expenses: Not hard at all  Food Insecurity: No Food Insecurity (10/15/2024)   Epic    Worried About Programme Researcher, Broadcasting/film/video in the Last Year: Never true    Ran Out of Food in the Last Year: Never true  Transportation Needs: No Transportation Needs (10/15/2024)   Epic    Lack of Transportation (Medical): No    Lack of Transportation (Non-Medical): No  Physical Activity: Insufficiently Active (10/15/2024)   Exercise Vital Sign    Days of Exercise per Week: 3 days    Minutes of Exercise per Session: 40 min  Stress: No Stress Concern Present (10/15/2024)   [employer] of Occupational Health - Occupational Stress Questionnaire    Feeling of Stress: Not at all  Social Connections: Moderately Integrated (10/15/2024)   Social Connection and Isolation Panel    Frequency of Communication with Friends and Family: More than three times a week    Frequency of Social Gatherings with Friends and Family: More than three times a week    Attends Religious Services: More than 4 times per year    Active Member of [employer] or Organizations: Yes    Attends Banker Meetings: More than 4 times per year    Marital Status: Divorced  Intimate Partner Violence: Not At Risk (04/19/2024)   Humiliation, Afraid, Rape, and Kick questionnaire    Fear of Current or Ex-Partner: No    Emotionally Abused: No    Physically Abused: No    Sexually Abused: No  Depression (PHQ2-9): Low Risk  (10/19/2024)   Depression (PHQ2-9)    PHQ-2 Score: 0  Alcohol Screen: Low Risk (04/18/2024)   Alcohol Screen    Last Alcohol Screening Score (AUDIT): 0  Housing: Unknown (10/15/2024)   Epic    Unable to Pay for Housing in the Last Year: No    Number of Times Moved in the Last Year: Not on file    Homeless in the Last Year: No  Utilities: Not At Risk (08/10/2024)   Received from [facility]   Epic    In the past 12 months has the electric, gas, oil, or water company threatened to shut off services in your home?: No  Health Literacy: Adequate Health Literacy (04/19/2024)   B1300 Health Literacy    Frequency of need for help with medical instructions: Never     Outpatient Medications Prior to Visit  Medication Sig Dispense Refill   aspirin  EC 81 MG tablet Take 81 mg by mouth in the morning.     clopidogrel  (PLAVIX ) 75 MG tablet Take 75 mg by mouth in the morning.     cyanocobalamin  1000 MCG tablet Take 2,000 mcg by mouth daily at 12 noon.     diphenhydrAMINE (BENADRYL) 12.5 MG/5ML elixir Take 12.5 mg by mouth every 6 (six) hours as needed (allergic reaction).     EPINEPHrine  0.3 mg/0.3 mL IJ SOAJ injection Inject 0.3 mg into the muscle once.      escitalopram  (LEXAPRO ) 10 MG tablet Take 1 tablet (10 mg total) by mouth daily. (Patient taking differently: Take 10 mg by mouth every evening.) 90 tablet 1   losartan (COZAAR) 25 MG tablet Take 25 mg by mouth in the morning.     mesalamine  (LIALDA ) 1.2 g EC tablet Take 1.2 g by mouth 3 (three) times daily with meals.      metroNIDAZOLE  (METROGEL ) 1 % gel Apply 1 Application topically daily as needed (skin reaction.).     nitroGLYCERIN  (NITROSTAT ) 0.4 MG SL tablet DISSOLVE (1) TABLET  UNDER TONGUE AS NEEDED TO RELIEVE CHEST PAIN. MAYREPEAT EVERY 5 MINUTES. 25 tablet 0   pantoprazole (PROTONIX) 20 MG tablet Take 20 mg by mouth in the morning.     prednisoLONE  acetate (PRED FORTE ) 1 % ophthalmic suspension Place 1 drop into both  eyes 2 (two) times daily as needed (blepharitis (eye inflammation)).     spironolactone (ALDACTONE) 25 MG tablet Take 12.5 mg by mouth in the morning.     verapamil  (VERELAN ) 120 MG 24 hr capsule Take 120 mg by mouth every evening. (1700)     melatonin 3 MG TABS tablet Take 3 mg by mouth at bedtime.     REPATHA 140 MG/ML SOSY Inject 140 mg into the skin every 14 (fourteen) days.     traMADol  (ULTRAM ) 50 MG tablet Take 1 tablet (50 mg total) by mouth every 6 (six) hours as needed for moderate pain (pain score 4-6). 12 tablet 0   No facility-administered medications prior to visit.    Allergies[1]  ROS Review of Systems Negative unless indicated in HPI.    Objective:    Physical Exam Constitutional:      Appearance: Normal appearance.  HENT:     Mouth/Throat:     Mouth: Mucous membranes are moist.  Eyes:     Conjunctiva/sclera: Conjunctivae normal.     Pupils: Pupils are equal, round, and reactive to light.  Cardiovascular:     Rate and Rhythm: Normal rate and regular rhythm.     Pulses: Normal pulses.     Heart sounds: Normal heart sounds.  Pulmonary:     Effort: Pulmonary effort is normal.     Breath sounds: Normal breath sounds.  Musculoskeletal:     Cervical back: Normal range of motion. No tenderness.  Skin:    General: Skin is warm.     Findings: Rash (Erythematous macule on the chest flat nontender no scaling, crusting or bleeding noted) present.  Neurological:     General: No focal deficit present.     Mental Status: She is alert and oriented to person, place, and time. Mental status is at baseline.  Psychiatric:        Mood and Affect: Mood normal.        Behavior: Behavior normal.        Thought Content: Thought content normal.     BP 132/82   Pulse 72   Temp 97.7 F (36.5 C)   Ht 5' 1 (1.549 m)   Wt 173 lb 6.4 oz (78.7 kg)   SpO2 94%   BMI 32.76 kg/m  Wt Readings from Last 3 Encounters:  10/19/24 173 lb 6.4 oz (78.7 kg)  09/27/24 173 lb (78.5 kg)   08/29/24 173 lb 9.6 oz (78.7 kg)     Health Maintenance  Topic Date Due   Influenza Vaccine  02/06/2025 (Originally 06/09/2024)   Pneumococcal Vaccine: 50+ Years (1 of 2 - PCV) 03/07/2025 (Originally 02/13/1975)   Medicare Annual Wellness (AWV)  04/19/2025   Mammogram  08/22/2025   Colonoscopy  09/30/2028   Bone Density Scan  Completed   Hepatitis C Screening  Completed   Meningococcal B Vaccine  Aged Out   DTaP/Tdap/Td  Discontinued   COVID-19 Vaccine  Discontinued   Zoster Vaccines- Shingrix  Discontinued    There are no preventive care reminders to display for this patient.  Lab Results  Component Value Date   TSH 2.85 03/03/2024   Lab Results  Component Value Date   WBC 8.1 06/23/2024  HGB 12.4 06/23/2024   HCT 36.0 06/23/2024   MCV 91.8 06/23/2024   PLT 293 06/23/2024   Lab Results  Component Value Date   NA 140 07/07/2024   K 4.0 07/07/2024   CO2 28 07/07/2024   GLUCOSE 95 07/07/2024   BUN 15 07/07/2024   CREATININE 0.75 07/07/2024   BILITOT 0.5 08/01/2024   ALKPHOS 80 08/01/2024   AST 19 08/01/2024   ALT 21 08/01/2024   PROT 6.9 08/01/2024   ALBUMIN 4.2 08/01/2024   CALCIUM  9.2 07/07/2024   ANIONGAP 10 06/23/2024   GFR 81.25 07/07/2024   Lab Results  Component Value Date   CHOL 150 07/07/2024   Lab Results  Component Value Date   HDL 47.70 07/07/2024   Lab Results  Component Value Date   LDLCALC 82 07/07/2024   Lab Results  Component Value Date   TRIG 102.0 07/07/2024   Lab Results  Component Value Date   CHOLHDL 3 07/07/2024   No results found for: HGBA1C    Assessment & Plan:   Assessment & Plan Rash Persistent rash for five weeks, primarily on the right upper chest and below the breast. Current topical antifungal treatment controls symptoms but does not eradicate. No fever or systemic symptoms. - Prescribed clotrimazole  to be applied twice daily. - Advised to monitor for improvement over seven days and report if no  improvement.         Assessment & Plan    Follow-up: Return if symptoms worsen or fail to improve.   Jennyfer Nickolson, NP     [1]  Allergies Allergen Reactions   Ciprofloxacin Anaphylaxis   Keflex [Cephalexin] Anaphylaxis   Lisinopril Anaphylaxis   Meclizine Anaphylaxis   Monurol [Fosfomycin Tromethamine   (Obsolete)] Rash    Splotchy rash all over body. Breathing became difficult/tightened. Took benadryl immediately so she did not swell    Sulfa Antibiotics Anaphylaxis   Tessalon [Benzonatate] Anaphylaxis   Influenza Virus Vaccine Other (See Comments)    History of Epstein Barr   Codeine Nausea And Vomiting

## 2024-11-03 ENCOUNTER — Telehealth: Payer: Self-pay

## 2024-11-03 NOTE — Telephone Encounter (Signed)
 Copied from CRM #[id number]. Topic: Clinical - Medical Advice >> Nov 03, 2024  9:54 AM Rea ORN wrote: Reason for CRM: Pt would like to see if PCP would call in Celebrex  for her joint pain. She stated Tylenol  is not working any more.  Please call back, [phone]

## 2024-11-07 ENCOUNTER — Other Ambulatory Visit: Payer: Self-pay

## 2024-11-07 DIAGNOSIS — R7989 Other specified abnormal findings of blood chemistry: Secondary | ICD-10-CM

## 2024-11-07 DIAGNOSIS — E7800 Pure hypercholesterolemia, unspecified: Secondary | ICD-10-CM

## 2024-11-21 ENCOUNTER — Other Ambulatory Visit (INDEPENDENT_AMBULATORY_CARE_PROVIDER_SITE_OTHER)

## 2024-11-21 DIAGNOSIS — E7800 Pure hypercholesterolemia, unspecified: Secondary | ICD-10-CM | POA: Diagnosis not present

## 2024-11-21 DIAGNOSIS — R7989 Other specified abnormal findings of blood chemistry: Secondary | ICD-10-CM | POA: Diagnosis not present

## 2024-11-21 LAB — LIPID PANEL
Cholesterol: 160 mg/dL (ref 28–200)
HDL: 49 mg/dL
LDL Cholesterol: 84 mg/dL (ref 10–99)
NonHDL: 111.18
Total CHOL/HDL Ratio: 3
Triglycerides: 138 mg/dL (ref 10.0–149.0)
VLDL: 27.6 mg/dL (ref 0.0–40.0)

## 2024-11-21 LAB — HEPATIC FUNCTION PANEL
ALT: 25 U/L (ref 3–35)
AST: 22 U/L (ref 5–37)
Albumin: 4.5 g/dL (ref 3.5–5.2)
Alkaline Phosphatase: 90 U/L (ref 39–117)
Bilirubin, Direct: 0.1 mg/dL (ref 0.1–0.3)
Total Bilirubin: 0.5 mg/dL (ref 0.2–1.2)
Total Protein: 7.1 g/dL (ref 6.0–8.3)

## 2024-11-21 LAB — BASIC METABOLIC PANEL WITH GFR
BUN: 17 mg/dL (ref 6–23)
CO2: 27 meq/L (ref 19–32)
Calcium: 9.7 mg/dL (ref 8.4–10.5)
Chloride: 104 meq/L (ref 96–112)
Creatinine, Ser: 0.82 mg/dL (ref 0.40–1.20)
GFR: 72.8 mL/min
Glucose, Bld: 83 mg/dL (ref 70–99)
Potassium: 4 meq/L (ref 3.5–5.1)
Sodium: 140 meq/L (ref 135–145)

## 2024-11-21 LAB — CBC WITH DIFFERENTIAL/PLATELET
Basophils Absolute: 0 K/uL (ref 0.0–0.1)
Basophils Relative: 0.6 % (ref 0.0–3.0)
Eosinophils Absolute: 0.2 K/uL (ref 0.0–0.7)
Eosinophils Relative: 3 % (ref 0.0–5.0)
HCT: 37.5 % (ref 36.0–46.0)
Hemoglobin: 12.9 g/dL (ref 12.0–15.0)
Lymphocytes Relative: 19.3 % (ref 12.0–46.0)
Lymphs Abs: 1.5 K/uL (ref 0.7–4.0)
MCHC: 34.4 g/dL (ref 30.0–36.0)
MCV: 92 fl (ref 78.0–100.0)
Monocytes Absolute: 0.6 K/uL (ref 0.1–1.0)
Monocytes Relative: 7.9 % (ref 3.0–12.0)
Neutro Abs: 5.3 K/uL (ref 1.4–7.7)
Neutrophils Relative %: 69.2 % (ref 43.0–77.0)
Platelets: 339 K/uL (ref 150.0–400.0)
RBC: 4.07 Mil/uL (ref 3.87–5.11)
RDW: 14.2 % (ref 11.5–15.5)
WBC: 7.7 K/uL (ref 4.0–10.5)

## 2024-11-22 ENCOUNTER — Ambulatory Visit: Payer: Self-pay | Admitting: Internal Medicine

## 2024-11-22 ENCOUNTER — Other Ambulatory Visit: Payer: Self-pay | Admitting: Internal Medicine

## 2024-11-23 ENCOUNTER — Encounter: Payer: Self-pay | Admitting: Internal Medicine

## 2024-11-23 ENCOUNTER — Ambulatory Visit
Admission: RE | Admit: 2024-11-23 | Discharge: 2024-11-23 | Disposition: A | Discharge status: Home / Self Care | Attending: Internal Medicine | Admitting: Internal Medicine

## 2024-11-23 ENCOUNTER — Ambulatory Visit
Admission: RE | Admit: 2024-11-23 | Discharge: 2024-11-23 | Disposition: A | Source: Ambulatory Visit | Attending: Internal Medicine | Admitting: Internal Medicine

## 2024-11-23 ENCOUNTER — Ambulatory Visit: Admitting: Internal Medicine

## 2024-11-23 ENCOUNTER — Telehealth: Payer: Self-pay

## 2024-11-23 VITALS — BP 126/70 | HR 68 | Temp 98.1°F | Ht 61.0 in | Wt 171.0 lb

## 2024-11-23 DIAGNOSIS — M25511 Pain in right shoulder: Secondary | ICD-10-CM

## 2024-11-23 DIAGNOSIS — Z0000 Encounter for general adult medical examination without abnormal findings: Secondary | ICD-10-CM | POA: Diagnosis not present

## 2024-11-23 DIAGNOSIS — I10 Essential (primary) hypertension: Secondary | ICD-10-CM

## 2024-11-23 DIAGNOSIS — F32A Depression, unspecified: Secondary | ICD-10-CM | POA: Diagnosis not present

## 2024-11-23 DIAGNOSIS — F320 Major depressive disorder, single episode, mild: Secondary | ICD-10-CM

## 2024-11-23 DIAGNOSIS — F439 Reaction to severe stress, unspecified: Secondary | ICD-10-CM | POA: Diagnosis not present

## 2024-11-23 DIAGNOSIS — I779 Disorder of arteries and arterioles, unspecified: Secondary | ICD-10-CM

## 2024-11-23 DIAGNOSIS — M25562 Pain in left knee: Secondary | ICD-10-CM | POA: Diagnosis not present

## 2024-11-23 DIAGNOSIS — E559 Vitamin D deficiency, unspecified: Secondary | ICD-10-CM

## 2024-11-23 DIAGNOSIS — E7800 Pure hypercholesterolemia, unspecified: Secondary | ICD-10-CM | POA: Diagnosis not present

## 2024-11-23 DIAGNOSIS — I25118 Atherosclerotic heart disease of native coronary artery with other forms of angina pectoris: Secondary | ICD-10-CM | POA: Diagnosis not present

## 2024-11-23 DIAGNOSIS — K219 Gastro-esophageal reflux disease without esophagitis: Secondary | ICD-10-CM

## 2024-11-23 DIAGNOSIS — I700 Atherosclerosis of aorta: Secondary | ICD-10-CM

## 2024-11-23 DIAGNOSIS — G4730 Sleep apnea, unspecified: Secondary | ICD-10-CM

## 2024-11-23 DIAGNOSIS — K50919 Crohn's disease, unspecified, with unspecified complications: Secondary | ICD-10-CM

## 2024-11-23 MED ORDER — ESCITALOPRAM OXALATE 10 MG PO TABS: 10.0000 mg | ORAL_TABLET | Freq: Every day | ORAL | 1 refills | Status: AC

## 2024-11-23 NOTE — Progress Notes (Signed)
 "  Subjective:    Patient ID: Morgan Moore, female    DOB: 04-Jul-1956, 69 y.o.   MRN: [id number]  Patient here for  Chief Complaint  Patient presents with   Medical Management of Chronic Issues   Annual Exam    HPI Here for a physical exam. Had called in with concerns regarding joint pain. Saw cardiology 10/30/24 - tolerating cpap. Recommended could add losartan 12.5mg  in the evening if spikes. On inclisiran. Reports persistent pain - right clavicle/right shoulder. S/p PT. Discussed xray given persistent pain. Also having persistent pain - left knee. Discussed f/u with Dr Mardee. Breathing stable. No increased cough or congestion. No abdominal pain or bowel change. Has noticed some nosebleeds at night.    Past Medical History:  Diagnosis Date   Allergy    Anxiety    Aortic atherosclerosis    Atypical angina    Bilateral carotid artery disease    Chicken pox    Complication of anesthesia    a.) PONV   Coronary artery disease    a.) NSTEMI 06/08/2016 --> 80% pLCx (2.75 x 12 mm Resolute Onyx DES), 90% mRCA ( 2.25 x 22 mm Resolute Onyx DES); b.) LHC/PCI 05/07/2021: 90% pLAD (3.0 x 15 mm Onyx DES). 80% mRCA ( 2.5 x 22 mm Onyx DES); c.) NSTEMI 11/27/2021 --> 95% ISR LAD (2.75 x 12 Onyx Frontier)   Crohn disease (HCC)    Depression    DOE (dyspnea on exertion)    DVT (deep venous thrombosis) (HCC) 2017   Elevated lipoprotein(a)    GERD (gastroesophageal reflux disease)    Heart murmur    History of 2019 novel coronavirus disease (COVID-19) 06/27/2021   History of kidney stones    Hyperlipidemia    Hypertension    IDA (iron deficiency anemia)    Inflammatory bowel disease    Insomnia    a.) uses melatonin PRN   Long term current use of clopidogrel     Long-term use of aspirin  therapy    Migraines    NSTEMI (non-ST elevated myocardial infarction) (HCC) 06/07/2016   a. LHC/PCI 06/08/2016: 40% LAD, 80% pLCx (2.75 x 12 mm Resolute Onyx DES), 90% mLCx (2.25 x 22 mm Resolute  Onyx), 20% pRCA, 20% mRCA   NSTEMI (non-ST elevated myocardial infarction) (HCC) 11/27/2021   a.) LHC/PCI 11/28/2021: 95% ISR mLAD (2.75 x 12 Onyx Frontier DES)   OSA on CPAP    Osteoarthritis    Osteoporosis    Palpitation 2019   Paroxysmal SVT (supraventricular tachycardia)    Phlebitis    PONV (postoperative nausea and vomiting)    S/P primary angioplasty with coronary stent    a.) TOTAL OF: 5 stents as of 06/29/2024   Shingles    Status post bilateral cataract extraction    Varicose veins of both lower extremities    Past Surgical History:  Procedure Laterality Date   BREAST BIOPSY Left 07/05/2019   FIBROEPITHELIAL lesion/neg   BREAST CYST ASPIRATION     unsure of side   CARPAL TUNNEL RELEASE Right 12/02/2017   Procedure: CARPAL TUNNEL RELEASE ENDOSCOPIC;  Surgeon: Edie Norleen PARAS, MD;  Location: [facility];  Service: Orthopedics;  Laterality: Right;   CATARACT EXTRACTION Bilateral    CORONARY ANGIOPLASTY WITH STENT PLACEMENT Left 06/08/2016   Procedure: CORONARY ANGIOPLASTY WITH STENT PLACEMENT; Location: [facility]   CORONARY ANGIOPLASTY WITH STENT PLACEMENT Left 05/07/2021   Procedure:CORONARY ANGIOPLASTY WITH STENT PLACEMENT; Location: [facility]; Surgeon: Jerilynn Cleveland, MD   CORONARY ANGIOPLASTY WITH STENT  PLACEMENT Left 11/28/2021   Procedure:CORONARY ANGIOPLASTY WITH STENT PLACEMENT; Location: [facility]; Surgeon: Karyle Dunk, MD   FRACTURE SURGERY     GANGLION CYST EXCISION Right 12/02/2017   Procedure: REMOVAL GANGLION OF WRIST;  Surgeon: Edie Norleen PARAS, MD;  Location: [facility];  Service: Orthopedics;  Laterality: Right;   HYSTEROSCOPY WITH D & C N/A 04/19/2020   Procedure: DILATATION AND CURETTAGE /HYSTEROSCOPY, POSSIBLE  POLYPECTOMY;  Surgeon: Verdon Keen, MD;  Location: [facility];  Service: Gynecology;  Laterality: N/A;   KNEE ARTHROSCOPY WITH MEDIAL MENISECTOMY Left 06/30/2024   Procedure: ARTHROSCOPY, KNEE, WITH MEDIAL MENISCECTOMY;  Surgeon: Mardee Lynwood SQUIBB, MD;  Location:  [facility];  Service: Orthopedics;  Laterality: Left;   LEFT HEART CATH AND CORONARY ANGIOGRAPHY Left 09/24/2014   Procedure: LEFT HEART CATH AND CORONARY ANGIOGRAPHY; Location: [facility]; Surgeon: Margie Lovelace, MD   LEFT HEART CATH AND CORONARY ANGIOGRAPHY Left 01/23/2022   Procedure: LEFT HEART CATH AND CORONARY ANGIOGRAPHY; Location: [facility]; Surgeon: Ozell Estelle, MD   MENISCUS DEBRIDEMENT Left 06/30/2024   Procedure: DEBRIDEMENT, MENISCUS, KNEE;  Surgeon: Mardee Lynwood SQUIBB, MD;  Location: [facility];  Service: Orthopedics;  Laterality: Left;   TONSILLECTOMY AND ADENOIDECTOMY  1962   TUBAL LIGATION  1993   Family History  Problem Relation Age of Onset   Heart disease Father    Arthritis Father    Heart disease Brother    Asthma Brother    Early death Brother    Cancer Maternal Aunt    Breast cancer Maternal Aunt    Stroke Maternal Aunt    Arthritis Maternal Aunt    Stroke Mother    Hypertension Mother    Asthma Mother    Arthritis Mother    Varicose Veins Mother    Early death Daughter    Kidney disease Neg Hx    GU problems Neg Hx    Kidney cancer Neg Hx    Social History   Socioeconomic History   Marital status: Single    Spouse name: Not on file   Number of children: Not on file   Years of education: Not on file   Highest education level: Master's degree (e.g., MA, MS, MEng, MEd, MSW, MBA)  Occupational History   Not on file  Tobacco Use   Smoking status: Never   Smokeless tobacco: Never  Vaping Use   Vaping status: Never Used  Substance and Sexual Activity   Alcohol use: Never   Drug use: Never   Sexual activity: Not Currently  Other Topics Concern   Not on file  Social History Narrative   Had 2 kids. Daughter died in 12-26-2011 son living    Former writer    Social Drivers of Health   Tobacco Use: Low Risk (11/26/2024)   Patient History    Smoking Tobacco Use: Never    Smokeless Tobacco Use: Never    Passive Exposure: Not on file  Financial Resource  Strain: Low Risk (10/15/2024)   Overall Financial Resource Strain (CARDIA)    Difficulty of Paying Living Expenses: Not hard at all  Food Insecurity: No Food Insecurity (10/15/2024)   Epic    Worried About Programme Researcher, Broadcasting/film/video in the Last Year: Never true    Ran Out of Food in the Last Year: Never true  Transportation Needs: No Transportation Needs (10/15/2024)   Epic    Lack of Transportation (Medical): No    Lack of Transportation (Non-Medical): No  Physical Activity: Insufficiently Active (10/15/2024)   Exercise Vital  Sign    Days of Exercise per Week: 3 days    Minutes of Exercise per Session: 40 min  Stress: No Stress Concern Present (10/15/2024)   [employer] of Occupational Health - Occupational Stress Questionnaire    Feeling of Stress: Not at all  Social Connections: Moderately Integrated (10/15/2024)   Social Connection and Isolation Panel    Frequency of Communication with Friends and Family: More than three times a week    Frequency of Social Gatherings with Friends and Family: More than three times a week    Attends Religious Services: More than 4 times per year    Active Member of Clubs or Organizations: Yes    Attends Banker Meetings: More than 4 times per year    Marital Status: Divorced  Depression (PHQ2-9): Low Risk (11/23/2024)   Depression (PHQ2-9)    PHQ-2 Score: 0  Alcohol Screen: Low Risk (04/18/2024)   Alcohol Screen    Last Alcohol Screening Score (AUDIT): 0  Housing: Unknown (10/15/2024)   Epic    Unable to Pay for Housing in the Last Year: No    Number of Times Moved in the Last Year: Not on file    Homeless in the Last Year: No  Utilities: Not At Risk (08/10/2024)   [facility]   Epic    In the past 12 months has the electric, gas, oil, or water company threatened to shut off services in your home?: No  Health Literacy: Adequate Health Literacy (04/19/2024)   B1300 Health Literacy    Frequency of need for  help with medical instructions: Never     Review of Systems  Constitutional:  Negative for appetite change and unexpected weight change.  HENT:  Negative for congestion, sinus pressure and sore throat.   Eyes:  Negative for pain and visual disturbance.  Respiratory:  Negative for cough, chest tightness and shortness of breath.   Cardiovascular:  Negative for chest pain, palpitations and leg swelling.  Gastrointestinal:  Negative for abdominal pain, constipation and diarrhea.  Genitourinary:  Negative for difficulty urinating and dysuria.  Musculoskeletal:        Knee pain as outlined. Right shoulder and clavicle pain.   Skin:  Negative for color change and rash.  Neurological:  Negative for dizziness and headaches.  Hematological:  Negative for adenopathy. Does not bruise/bleed easily.  Psychiatric/Behavioral:  Negative for agitation and dysphoric mood.        Objective:     BP 126/70   Pulse 68   Temp 98.1 F (36.7 C) (Oral)   Ht 5' 1 (1.549 m)   Wt 171 lb (77.6 kg)   SpO2 98%   BMI 32.31 kg/m  Wt Readings from Last 3 Encounters:  11/23/24 171 lb (77.6 kg)  10/19/24 173 lb 6.4 oz (78.7 kg)  09/27/24 173 lb (78.5 kg)    Physical Exam Vitals reviewed.  Constitutional:      General: She is not in acute distress.    Appearance: Normal appearance.  HENT:     Head: Normocephalic and atraumatic.     Right Ear: External ear normal.     Left Ear: External ear normal.     Mouth/Throat:     Pharynx: No oropharyngeal exudate or posterior oropharyngeal erythema.  Eyes:     General: No scleral icterus.       Right eye: No discharge.        Left eye: No discharge.  Conjunctiva/sclera: Conjunctivae normal.  Neck:     Thyroid : No thyromegaly.  Cardiovascular:     Rate and Rhythm: Normal rate and regular rhythm.  Pulmonary:     Effort: No respiratory distress.     Breath sounds: Normal breath sounds. No wheezing.  Abdominal:     General: Bowel sounds are normal.      Palpations: Abdomen is soft.     Tenderness: There is no abdominal tenderness.  Musculoskeletal:        General: No swelling.     Cervical back: Neck supple. No tenderness.     Comments: Increased pain - with abduction. No significant pain with rotation of forearm.   Lymphadenopathy:     Cervical: No cervical adenopathy.  Skin:    Findings: No erythema or rash.  Neurological:     Mental Status: She is alert.  Psychiatric:        Mood and Affect: Mood normal.        Behavior: Behavior normal.         Outpatient Encounter Medications as of 11/23/2024  Medication Sig   aspirin  EC 81 MG tablet Take 81 mg by mouth in the morning.   celecoxib  (CELEBREX ) 200 MG capsule Take 200 mg by mouth daily.   clopidogrel  (PLAVIX ) 75 MG tablet Take 75 mg by mouth in the morning.   clotrimazole  (CLOTRIMAZOLE  ANTI-FUNGAL) 1 % cream Apply 1 Application topically 2 (two) times daily.   cyanocobalamin  1000 MCG tablet Take 2,000 mcg by mouth daily at 12 noon.   diphenhydrAMINE (BENADRYL) 12.5 MG/5ML elixir Take 12.5 mg by mouth every 6 (six) hours as needed (allergic reaction).   EPINEPHrine  0.3 mg/0.3 mL IJ SOAJ injection Inject 0.3 mg into the muscle once.    escitalopram  (LEXAPRO ) 10 MG tablet TAKE ONE TABLET BY MOUTH EVERY DAY   LEQVIO 284 MG/1.5ML SOSY injection Inject into the skin.   losartan (COZAAR) 25 MG tablet Take 25 mg by mouth in the morning.   mesalamine  (LIALDA ) 1.2 g EC tablet Take 1.2 g by mouth 3 (three) times daily with meals.    metroNIDAZOLE  (METROGEL ) 1 % gel Apply 1 Application topically daily as needed (skin reaction.).   nitroGLYCERIN  (NITROSTAT ) 0.4 MG SL tablet DISSOLVE (1) TABLET UNDER TONGUE AS NEEDED TO RELIEVE CHEST PAIN. MAYREPEAT EVERY 5 MINUTES.   pantoprazole (PROTONIX) 20 MG tablet Take 20 mg by mouth in the morning.   prednisoLONE  acetate (PRED FORTE ) 1 % ophthalmic suspension Place 1 drop into both eyes 2 (two) times daily as needed (blepharitis (eye inflammation)).    spironolactone (ALDACTONE) 25 MG tablet Take 12.5 mg by mouth in the morning.   verapamil  (VERELAN ) 120 MG 24 hr capsule Take 120 mg by mouth every evening. (1700)   escitalopram  (LEXAPRO ) 10 MG tablet Take 1 tablet (10 mg total) by mouth daily.   [DISCONTINUED] escitalopram  (LEXAPRO ) 10 MG tablet Take 1 tablet (10 mg total) by mouth daily. (Patient taking differently: Take 10 mg by mouth every evening.)   [DISCONTINUED] melatonin 3 MG TABS tablet Take 3 mg by mouth at bedtime.   [DISCONTINUED] REPATHA 140 MG/ML SOSY Inject 140 mg into the skin every 14 (fourteen) days.   [DISCONTINUED] traMADol  (ULTRAM ) 50 MG tablet Take 1 tablet (50 mg total) by mouth every 6 (six) hours as needed for moderate pain (pain score 4-6).   No facility-administered encounter medications on file as of 11/23/2024.     Lab Results  Component Value Date   WBC 7.7  11/21/2024   HGB 12.9 11/21/2024   HCT 37.5 11/21/2024   PLT 339.0 11/21/2024   GLUCOSE 83 11/21/2024   CHOL 160 11/21/2024   TRIG 138.0 11/21/2024   HDL 49.00 11/21/2024   LDLCALC 84 11/21/2024   ALT 25 11/21/2024   AST 22 11/21/2024   NA 140 11/21/2024   K 4.0 11/21/2024   CL 104 11/21/2024   CREATININE 0.82 11/21/2024   BUN 17 11/21/2024   CO2 27 11/21/2024   TSH 2.85 03/03/2024   INR 1.0 12/23/2017    MM 3D SCREENING MAMMOGRAM BILATERAL BREAST Result Date: 08/25/2024 CLINICAL DATA:  Screening. EXAM: DIGITAL SCREENING BILATERAL MAMMOGRAM WITH TOMOSYNTHESIS AND CAD TECHNIQUE: Bilateral screening digital craniocaudal and mediolateral oblique mammograms were obtained. Bilateral screening digital breast tomosynthesis was performed. The images were evaluated with computer-aided detection. COMPARISON:  Previous exam(s). ACR Breast Density Category c: The breasts are heterogeneously dense, which may obscure small masses. FINDINGS: There are no findings suspicious for malignancy. IMPRESSION: No mammographic evidence of malignancy. A result letter of  this screening mammogram will be mailed directly to the patient. RECOMMENDATION: Screening mammogram in one year. (Code:SM-B-01Y) BI-RADS CATEGORY  1: Negative. Electronically Signed   By: Alm Parkins M.D.   On: 08/25/2024 07:12       Assessment & Plan:  Health care maintenance Assessment & Plan: Physical today 11/23/24.  Mammogram 08/13/23- Birads 0.  F/u right breast mammogram and ultrasound 08/17/23 - Birads II. Mammogram 08/22/24 - Birads I.  Colonoscopy 08/2018.     Right shoulder pain, unspecified chronicity Assessment & Plan: Persistent right shoulder and clavicle pain. Check xray shoulder and clavicle. S/p PT. Hold on celebrex , given history and varying blood pressures. Follow.   Orders: -     DG Shoulder Right; Future -     DG Clavicle Right; Future  Left knee pain, unspecified chronicity Assessment & Plan: Has seen ortho.  MRI has been performed. S/p aspiration of joint fluid.  MRI - moderate joint effusion, sprain meniscal tear.  Request referral to Dr Mardee.   Orders: -     Ambulatory referral to Orthopedic Surgery  Vitamin D  deficiency Assessment & Plan: Vitamin D  - 02/2024 - wnl.    Stress Assessment & Plan: Continue lexapro . Overall appears to be stable. Follow.    Sleep apnea, unspecified type Assessment & Plan: Seeing Dr Isaiah. Continue cpap.    Mild depression Assessment & Plan: Continue lexapro . Overall stable.    Major depressive disorder, single episode, mild Assessment & Plan: Continue lexapro . Appears to be stable.    Hypercholesterolemia Assessment & Plan: Continues on leqvia. Intolerant to statin medication. . Follow lipid panel.    Gastroesophageal reflux disease, unspecified whether esophagitis present Assessment & Plan: Continue protonix. Upper symptoms appear to be controlled.    Essential hypertension Assessment & Plan: Blood pressure as outlined. Currently on - spironolactone, losartan and verapamil  - as outlined. Follow  pressures. Follow metabolic panel.    Crohn's disease with complication, unspecified gastrointestinal tract location [facility]) Assessment & Plan: Has been on lialda .  Bowels have been relatively stable.  Stable. Continue f/u with GI.    Coronary artery disease of native artery of native heart with stable angina pectoris Assessment & Plan: Continue leqvia and aspirin .  Continue risk factor modification. Followed by cardiology. Currently stable.    Carotid artery disease, unspecified laterality, unspecified type Assessment & Plan: Continue repatha and aspirin .  Continue risk factor modification.  Saw Dr Jama 04/2022 - Given the patient's asymptomatic  subcritical stenosis no further invasive testing or surgery recommended. Duplex ultrasound shows 1-39% stenosis bilaterally (04/08/23). Continue antiplatelet therapy as prescribed Continue management of CAD, HTN and Hyperlipidemia. Follow up in 12 months with duplex ultrasound and physical exam. Is scheduled for f/u. Had f/u carotid ultrasound 03/2024. Continue f/u with [facility].    Aortic atherosclerosis Assessment & Plan: Continues on leqvia. Follow lipid panel.    Other orders -     Escitalopram  Oxalate; Take 1 tablet (10 mg total) by mouth daily.  Dispense: 90 tablet; Refill: 1     Allena Hamilton, MD "

## 2024-11-23 NOTE — Telephone Encounter (Signed)
 Copied from CRM #[id number]. Topic: General - Running Late >> Nov 23, 2024  8:44 AM Macario HERO wrote: Patient/patient representative is calling because they are running late for an appointment. Patient called to advised she is running late.  Patient has arrived.

## 2024-11-23 NOTE — Telephone Encounter (Signed)
 noted

## 2024-11-23 NOTE — Assessment & Plan Note (Signed)
 Physical today 11/23/24.  Mammogram 08/13/23- Birads 0.  F/u right breast mammogram and ultrasound 08/17/23 - Birads II. Mammogram 08/22/24 - Birads I.  Colonoscopy 08/2018.

## 2024-11-26 ENCOUNTER — Encounter: Payer: Self-pay | Admitting: Internal Medicine

## 2024-11-26 NOTE — Assessment & Plan Note (Signed)
 Continue protonix. Upper symptoms appear to be controlled.

## 2024-11-26 NOTE — Assessment & Plan Note (Signed)
 Has been on lialda .  Bowels have been relatively stable.  Stable. Continue f/u with GI.

## 2024-11-26 NOTE — Assessment & Plan Note (Signed)
 Continue leqvia and aspirin .  Continue risk factor modification. Followed by cardiology. Currently stable.

## 2024-11-26 NOTE — Assessment & Plan Note (Signed)
 Seeing Dr Isaiah. Continue cpap.

## 2024-11-26 NOTE — Assessment & Plan Note (Signed)
 Has seen ortho.  MRI has been performed. S/p aspiration of joint fluid.  MRI - moderate joint effusion, sprain meniscal tear.  Request referral to Dr Mardee.

## 2024-11-26 NOTE — Assessment & Plan Note (Signed)
 Blood pressure as outlined. Currently on - spironolactone, losartan and verapamil  - as outlined. Follow pressures. Follow metabolic panel.

## 2024-11-26 NOTE — Assessment & Plan Note (Signed)
Continue lexapro.  Overall appears to be stable.  Follow.

## 2024-11-26 NOTE — Assessment & Plan Note (Signed)
 Continues on leqvia. Follow lipid panel.

## 2024-11-26 NOTE — Assessment & Plan Note (Signed)
 Continue lexapro . Overall stable.

## 2024-11-26 NOTE — Assessment & Plan Note (Signed)
 Continues on leqvia. Intolerant to statin medication. . Follow lipid panel.

## 2024-11-26 NOTE — Assessment & Plan Note (Signed)
Continue lexapro.  Appears to be stable.  

## 2024-11-26 NOTE — Assessment & Plan Note (Signed)
 Vitamin D  - 02/2024 - wnl.

## 2024-11-26 NOTE — Assessment & Plan Note (Signed)
 Continue repatha and aspirin .  Continue risk factor modification.  Saw Dr Jama 04/2022 - Given the patient's asymptomatic subcritical stenosis no further invasive testing or surgery recommended. Duplex ultrasound shows 1-39% stenosis bilaterally (04/08/23). Continue antiplatelet therapy as prescribed Continue management of CAD, HTN and Hyperlipidemia. Follow up in 12 months with duplex ultrasound and physical exam. Is scheduled for f/u. Had f/u carotid ultrasound 03/2024. Continue f/u with [facility].

## 2024-11-26 NOTE — Assessment & Plan Note (Signed)
 Persistent right shoulder and clavicle pain. Check xray shoulder and clavicle. S/p PT. Hold on celebrex , given history and varying blood pressures. Follow.

## 2024-11-30 ENCOUNTER — Ambulatory Visit: Payer: Self-pay | Admitting: Internal Medicine

## 2024-12-13 ENCOUNTER — Ambulatory Visit: Admitting: Internal Medicine

## 2024-12-13 ENCOUNTER — Encounter: Payer: Self-pay | Admitting: Internal Medicine

## 2024-12-13 VITALS — BP 128/80 | HR 78 | Ht 61.0 in | Wt 172.4 lb

## 2024-12-13 DIAGNOSIS — R030 Elevated blood-pressure reading, without diagnosis of hypertension: Secondary | ICD-10-CM | POA: Diagnosis not present

## 2024-12-13 DIAGNOSIS — G4733 Obstructive sleep apnea (adult) (pediatric): Secondary | ICD-10-CM

## 2024-12-13 DIAGNOSIS — J9691 Respiratory failure, unspecified with hypoxia: Secondary | ICD-10-CM | POA: Diagnosis not present

## 2024-12-13 DIAGNOSIS — I10 Essential (primary) hypertension: Secondary | ICD-10-CM

## 2024-12-13 NOTE — Patient Instructions (Addendum)
 Please Follow Up with Your Cardiologist, or Primary Care, Consider Seeing a Nephrologist (Kidney) Doctor in Conjunction to Other Providers to Address Your Blood Pressure.  Continue using your CPAP every night, minimum of 4-6 hours a night.  Change equipment as directed. Wash your tubing with warm soap and water daily, hang to dry. Wash humidifier portion weekly. Use bottled, distilled water and change daily Be aware of reduced alertness and do not drive or operate heavy machinery if experiencing this or drowsiness.  Exercise encouraged, as tolerated. Healthy weight management discussed.  Avoid or decrease alcohol consumption and medications that make you more sleepy, if possible. Notify if persistent daytime sleepiness occurs even with consistent use of PAP therapy.   Change CPAP supplies... Every month Mask cushions and/or nasal pillows CPAP machine filters Every 3 months Mask frame (not including the headgear) CPAP tubing Every 6 months Mask headgear Chin strap (if applicable) Humidifier water tub

## 2024-12-13 NOTE — Progress Notes (Signed)
 "  Name: Virgie Kunda MRN: [id number] DOB: 06-22-1956      SPLIT NIGHT SLEEP STUDY Mod/Severe sleep apnea AHI 24 REM AHI 80 Supine AHI 33 O2 saturations less than 88% 58 minutes CPAP titration study to 11 cm water pressure controlled OSA   CHIEF COMPLAINT:  Follow-up assessment for OSA   HISTORY OF PRESENT ILLNESS: Discussed sleep data and reviewed with patient.  Encouraged proper weight management.  Discussed sleep hygiene Patient uses and benefits from therapy Using CPAP nightly and with naps Settings are comfortable and is sleeping well. Excellent compliance report CPAP 11 AHI reduced to 2.7   No exacerbation at this time No evidence of heart failure at this time No evidence or signs of infection at this time No respiratory distress No fevers, chills, nausea, vomiting, diarrhea No evidence of lower extremity edema No evidence hemoptysis   Patient having hypertensive episodes in the middle of the night Plan to assess overnight pulse symmetry while on CPAP   CARDIOVASCULAR ASSESSMENT Carotid artery disease, unspecified laterality, unspecified type (HCC) Continue crestor  and aspirin .  Saw Dr Jama 04/2022 - Given the patient's asymptomatic subcritical stenosis no further invasive testing or surgery recommended.  Duplex ultrasound shows 1-39% stenosis bilaterally (04/08/23). Continue antiplatelet therapy as prescribed Continue management of CAD, HTN and Hyperlipidemia.   Coronary artery disease of native artery of native heart with stable angina pectoris (HCC) Continues Continue risk factor modification.  Seeing cardiology.  3 MI's in the past has 5 stents CBC    Component Value Date/Time   WBC 7.7 11/21/2024 0939   RBC 4.07 11/21/2024 0939   HGB 12.9 11/21/2024 0939   HGB 13.8 02/25/2015 0950   HCT 37.5 11/21/2024 0939   HCT 40.4 02/25/2015 0950   PLT 339.0 11/21/2024 0939   PLT 192 02/25/2015 0950   MCV 92.0 11/21/2024 0939   MCV 90 02/25/2015  0950   MCH 31.6 06/23/2024 1505   MCHC 34.4 11/21/2024 0939   RDW 14.2 11/21/2024 0939   RDW 14.0 02/25/2015 0950   LYMPHSABS 1.5 11/21/2024 0939   LYMPHSABS 1.3 02/25/2015 0950   MONOABS 0.6 11/21/2024 0939   MONOABS 1.5 (H) 02/25/2015 0950   EOSABS 0.2 11/21/2024 0939   EOSABS 0.1 02/25/2015 0950   BASOSABS 0.0 11/21/2024 0939   BASOSABS 0.0 02/25/2015 0950       Latest Ref Rng & Units 11/21/2024    9:39 AM 07/07/2024    8:48 AM 06/23/2024    3:05 PM  BMP  Glucose 70 - 99 mg/dL 83  95  880   BUN 6 - 23 mg/dL 17  15  21    Creatinine 0.40 - 1.20 mg/dL 9.17  9.24  8.99   Sodium 135 - 145 mEq/L 140  140  140   Potassium 3.5 - 5.1 mEq/L 4.0  4.0  3.9   Chloride 96 - 112 mEq/L 104  102  106   CO2 19 - 32 mEq/L 27  28  24    Calcium  8.4 - 10.5 mg/dL 9.7  9.2  9.8      PAST MEDICAL HISTORY :   has a past medical history of Allergy, Anxiety, Aortic atherosclerosis, Atypical angina, Bilateral carotid artery disease, Chicken pox, Complication of anesthesia, Coronary artery disease, Crohn disease (HCC), Depression, DOE (dyspnea on exertion), DVT (deep venous thrombosis) (HCC) (2017), Elevated lipoprotein(a), GERD (gastroesophageal reflux disease), Heart murmur, History of 2019 novel coronavirus disease (COVID-19) (06/27/2021), History of kidney stones, Hyperlipidemia, Hypertension, IDA (iron deficiency anemia), Inflammatory  bowel disease, Insomnia, Long term current use of clopidogrel , Long-term use of aspirin  therapy, Migraines, NSTEMI (non-ST elevated myocardial infarction) (HCC) (06/07/2016), NSTEMI (non-ST elevated myocardial infarction) (HCC) (11/27/2021), OSA on CPAP, Osteoarthritis, Osteoporosis, Palpitation (2019), Paroxysmal SVT (supraventricular tachycardia), Phlebitis, PONV (postoperative nausea and vomiting), S/P primary angioplasty with coronary stent, Shingles, Status post bilateral cataract extraction, and Varicose veins of both lower extremities.  has a past surgical history that  includes Tonsillectomy and adenoidectomy (1962); Tubal ligation (1993); Cataract extraction (Bilateral); Breast cyst aspiration; Carpal tunnel release (Right, 12/02/2017); Ganglion cyst excision (Right, 12/02/2017); Hysteroscopy with D & C (N/A, 04/19/2020); Breast biopsy (Left, 07/05/2019); LEFT HEART CATH AND CORONARY ANGIOGRAPHY (Left, 09/24/2014); Coronary angioplasty with stent (Left, 06/08/2016); Coronary angioplasty with stent (Left, 05/07/2021); Coronary angioplasty with stent (Left, 11/28/2021); LEFT HEART CATH AND CORONARY ANGIOGRAPHY (Left, 01/23/2022); Meniscus debridement (Left, 06/30/2024); Knee arthroscopy with medial menisectomy (Left, 06/30/2024); and Fracture surgery. Prior to Admission medications   Medication Sig Start Date End Date Taking? Authorizing Provider  aspirin  EC 81 MG tablet Take 81 mg by mouth daily.    [provider]  Cholecalciferol 25 MCG (1000 UT) tablet Take 1,000 Units by mouth 2 (two) times daily.    [provider]  clopidogrel  (PLAVIX ) 75 MG tablet Take 75 mg by mouth daily.    [provider]  cyanocobalamin  1000 MCG tablet Take 1,000 mcg by mouth daily at 12 noon.     [provider]  diphenhydrAMINE (BENADRYL) 12.5 MG/5ML elixir Take 12.5 mg by mouth every 6 (six) hours as needed (allergic reaction).    [provider]  EPINEPHrine  0.3 mg/0.3 mL IJ SOAJ injection Inject 0.3 mg into the muscle once.  12/18/13   [provider]  escitalopram  (LEXAPRO ) 20 MG tablet TAKE ONE TABLET BY MOUTH ONCE DAILY 09/23/23   Glendia Shad, MD  Fe Fum-FePoly-Vit C-Vit B3 (INTEGRA) 62.5-62.5-40-3 MG CAPS TAKE ONE CAPSULE BY MOUTH three times WEEKLY. 10/05/22   Glendia Shad, MD  fluocinonide (LIDEX) 0.05 % external solution Apply 1 Application topically 2 (two) times daily. 03/26/23 03/25/24  [provider]  losartan (COZAAR) 25 MG tablet Take 25 mg by mouth daily.    [provider]  Melatonin-Pyridoxine  3-10 MG TABS Take 1 tablet by mouth at bedtime.    [provider]  mesalamine  (LIALDA ) 1.2 g EC tablet Take 1.2 g by mouth 3 (three) times daily with meals.     [provider]  mupirocin  ointment (BACTROBAN ) 2 % Apply to affected area bid 02/10/22   Glendia Shad, MD  nitroGLYCERIN  (NITROSTAT ) 0.4 MG SL tablet DISSOLVE (1) TABLET UNDER TONGUE AS NEEDED TO RELIEVE CHEST PAIN. MAYREPEAT EVERY 5 MINUTES. 12/09/20   Glendia Shad, MD  ondansetron  (ZOFRAN -ODT) 4 MG disintegrating tablet Take 1 tablet (4 mg total) by mouth daily as needed for nausea. 10/09/21   Glendia Shad, MD  pantoprazole (PROTONIX) 20 MG tablet Take 1 tablet by mouth daily. 01/18/23   [provider]  prednisoLONE  acetate (PRED FORTE ) 1 % ophthalmic suspension Place 1 drop into both eyes 2 (two) times daily as needed (for inflammation).    [provider]  REPATHA 140 MG/ML SOSY Inject into the skin. 01/20/22   [provider]  rosuvastatin  (CRESTOR ) 5 MG tablet TAKE ONE TABLET BY MOUTH THREE TIMES A WEEK 09/15/23   Glendia Shad, MD  verapamil  (CALAN ) 40 MG tablet Take 40 mg by mouth every 6 (six) hours as needed.    [provider]  verapamil  (VERELAN ) 180 MG 24 hr capsule Take 180 mg by mouth daily. 11/12/23   [provider]   Allergies  Allergen Reactions   Ciprofloxacin Anaphylaxis   Keflex [Cephalexin] Anaphylaxis   Lisinopril Anaphylaxis   Meclizine Anaphylaxis   Monurol [Fosfomycin Tromethamine   (Obsolete)] Rash    Splotchy rash all over body. Breathing became difficult/tightened. Took benadryl immediately so she did not swell    Sulfa Antibiotics Anaphylaxis   Tessalon [Benzonatate] Anaphylaxis   Influenza Virus Vaccine Other (See Comments)    History of Epstein Barr   Codeine Nausea And Vomiting    FAMILY HISTORY:  family history includes Arthritis in her father, maternal aunt, and mother; Asthma in her brother and mother; Breast cancer in her  maternal aunt; Cancer in her maternal aunt; Early death in her brother and daughter; Heart disease in her brother and father; Hypertension in her mother; Stroke in her maternal aunt and mother; Varicose Veins in her mother. SOCIAL HISTORY:  reports that she has never smoked. She has never used smokeless tobacco. She reports that she does not drink alcohol and does not use drugs.   BP 128/80   Pulse 78   Ht 5' 1 (1.549 m)   Wt 172 lb 6.4 oz (78.2 kg)   SpO2 99%   BMI 32.57 kg/m   Alert awake no respiratory distress No wheezing no rhonchi Positive systolic murmur No edema No focal deficits      ASSESSMENT AND PLAN SYNOPSIS 69 yo pleasant white female with  signs and symptoms of excessive daytime sleepiness with diagnosis of obstructive sleep apnea in the setting of  deconditioned state, with hypoxic resp failure   Assessment of OSA Previous AHI 24-35 Continue CPAP as prescribed  Excellent compliance report Reviewed compliance report in detail with patient Patient definitely benefits the use of CPAP therapy as prescribed Using CPAP nightly and with naps Pressure setting is comfortable and is sleeping well. CPAP prescription 11 AHI reduced to 2.7  No evidence of acute heart failure at this time No respiratory distress No fevers, chills, nausea, vomiting, diarrhea No evidence hemoptysis  Patient Instructions Need to be using your CPAP every night, minimum of 4-6 hours a night.  Change equipment as directed. Wash your tubing with warm soap and water daily, hang to dry. Wash humidifier portion weekly. Use bottled, distilled water and change daily Be aware of reduced alertness and do not drive or operate heavy machinery if experiencing this or drowsiness.  Exercise encouraged, as tolerated. Healthy weight management discussed.  Avoid or decrease alcohol consumption and medications that make you more sleepy, if possible. Notify if persistent daytime sleepiness occurs even  with consistent use of PAP therapy.   Change CPAP supplies... Every month Mask cushions and/or nasal pillows CPAP machine filters Every 3 months Mask frame (not including the headgear) CPAP tubing Every 6 months Mask headgear Chin strap (if applicable) Humidifier water tub Will increase humidity setting 1 level   Elevated blood pressure Check overnight pulse oximetry on CPAP Likely unrelated to underlying sleep apnea which is treated Recommend follow-up with cardiology    MEDICATION ADJUSTMENTS/LABS AND TESTS ORDERED: Increase humidity setting 1 level Obtain overnight pulse oximetry on CPAP     CURRENT MEDICATIONS REVIEWED AT LENGTH WITH PATIENT TODAY   Patient  satisfied with Plan of action and management. All questions answered   Follow up 6 months   I spent a total of 44 minutes dedicated to the care of this patient on the  date of this encounter to include pre-visit review of records, face-to-face time with the patient discussing conditions above, post visit ordering of testing, clinical documentation with the electronic health record, making appropriate referrals as documented, and communicating necessary information to the patient's healthcare team.    The Patient requires high complexity decision making for assessment and support, frequent evaluation and titration of therapies, application of advanced monitoring technologies and extensive interpretation of multiple databases.  Patient satisfied with Plan of action and management. All questions answered    Nickolas Alm Cellar, M.D.  [facility]  Medical Director [facility]          "

## 2025-03-27 ENCOUNTER — Other Ambulatory Visit

## 2025-03-29 ENCOUNTER — Ambulatory Visit: Admitting: Internal Medicine

## 2025-04-05 ENCOUNTER — Ambulatory Visit (INDEPENDENT_AMBULATORY_CARE_PROVIDER_SITE_OTHER): Admitting: Vascular Surgery

## 2025-04-23 ENCOUNTER — Ambulatory Visit
